# Patient Record
Sex: Female | Born: 1945 | Race: Black or African American | Hispanic: No | Smoking: Never smoker
Health system: Southern US, Community
[De-identification: ages and names within clinical notes are randomized; demographics above are authoritative.]

## PROBLEM LIST (undated history)

## (undated) DIAGNOSIS — L97909 Non-pressure chronic ulcer of unspecified part of unspecified lower leg with unspecified severity: Secondary | ICD-10-CM

## (undated) DIAGNOSIS — I872 Venous insufficiency (chronic) (peripheral): Secondary | ICD-10-CM

## (undated) DIAGNOSIS — G4733 Obstructive sleep apnea (adult) (pediatric): Secondary | ICD-10-CM

## (undated) DIAGNOSIS — K210 Gastro-esophageal reflux disease with esophagitis: Secondary | ICD-10-CM

## (undated) DIAGNOSIS — N183 Chronic kidney disease, stage 3 unspecified: Secondary | ICD-10-CM

## (undated) DIAGNOSIS — E1159 Type 2 diabetes mellitus with other circulatory complications: Secondary | ICD-10-CM

## (undated) DIAGNOSIS — I83009 Varicose veins of unspecified lower extremity with ulcer of unspecified site: Secondary | ICD-10-CM

## (undated) DIAGNOSIS — E559 Vitamin D deficiency, unspecified: Secondary | ICD-10-CM

## (undated) DIAGNOSIS — G8929 Other chronic pain: Secondary | ICD-10-CM

## (undated) DIAGNOSIS — J9600 Acute respiratory failure, unspecified whether with hypoxia or hypercapnia: Secondary | ICD-10-CM

## (undated) DIAGNOSIS — I5032 Chronic diastolic (congestive) heart failure: Secondary | ICD-10-CM

## (undated) DIAGNOSIS — I10 Essential (primary) hypertension: Secondary | ICD-10-CM

## (undated) DIAGNOSIS — E119 Type 2 diabetes mellitus without complications: Secondary | ICD-10-CM

## (undated) DIAGNOSIS — IMO0001 Reserved for inherently not codable concepts without codable children: Secondary | ICD-10-CM

## (undated) HISTORY — DX: Obstructive sleep apnea (adult) (pediatric): G47.33

## (undated) HISTORY — DX: Chronic diastolic (congestive) heart failure: I50.32

## (undated) HISTORY — DX: Non-pressure chronic ulcer of unspecified part of unspecified lower leg with unspecified severity: L97.909

## (undated) HISTORY — DX: Type 2 diabetes mellitus with other circulatory complications: E11.59

## (undated) HISTORY — DX: Vitamin D deficiency, unspecified: E55.9

## (undated) HISTORY — DX: Venous insufficiency (chronic) (peripheral): I87.2

## (undated) HISTORY — DX: Essential (primary) hypertension: I10

## (undated) HISTORY — DX: Gastro-esophageal reflux disease with esophagitis: K21.0

## (undated) HISTORY — DX: Other chronic pain: G89.29

## (undated) HISTORY — DX: Non-pressure chronic ulcer of unspecified part of unspecified lower leg with unspecified severity: I83.009

---

## 1997-10-14 HISTORY — PX: OTHER SURGICAL HISTORY: SHX169

## 2011-10-16 DIAGNOSIS — I739 Peripheral vascular disease, unspecified: Secondary | ICD-10-CM | POA: Diagnosis not present

## 2011-10-16 DIAGNOSIS — L98499 Non-pressure chronic ulcer of skin of other sites with unspecified severity: Secondary | ICD-10-CM | POA: Diagnosis not present

## 2011-10-17 DIAGNOSIS — R609 Edema, unspecified: Secondary | ICD-10-CM | POA: Diagnosis not present

## 2011-10-17 DIAGNOSIS — D649 Anemia, unspecified: Secondary | ICD-10-CM | POA: Diagnosis not present

## 2011-10-17 DIAGNOSIS — N179 Acute kidney failure, unspecified: Secondary | ICD-10-CM | POA: Diagnosis not present

## 2011-10-17 DIAGNOSIS — E119 Type 2 diabetes mellitus without complications: Secondary | ICD-10-CM | POA: Diagnosis not present

## 2011-10-18 DIAGNOSIS — Z79899 Other long term (current) drug therapy: Secondary | ICD-10-CM | POA: Diagnosis not present

## 2011-10-23 DIAGNOSIS — I739 Peripheral vascular disease, unspecified: Secondary | ICD-10-CM | POA: Diagnosis not present

## 2011-10-23 DIAGNOSIS — L98499 Non-pressure chronic ulcer of skin of other sites with unspecified severity: Secondary | ICD-10-CM | POA: Diagnosis not present

## 2011-10-23 DIAGNOSIS — L738 Other specified follicular disorders: Secondary | ICD-10-CM | POA: Diagnosis not present

## 2011-10-31 DIAGNOSIS — L98499 Non-pressure chronic ulcer of skin of other sites with unspecified severity: Secondary | ICD-10-CM | POA: Diagnosis not present

## 2011-10-31 DIAGNOSIS — I739 Peripheral vascular disease, unspecified: Secondary | ICD-10-CM | POA: Diagnosis not present

## 2011-11-06 DIAGNOSIS — L738 Other specified follicular disorders: Secondary | ICD-10-CM | POA: Diagnosis not present

## 2011-11-06 DIAGNOSIS — I739 Peripheral vascular disease, unspecified: Secondary | ICD-10-CM | POA: Diagnosis not present

## 2011-11-13 DIAGNOSIS — L98499 Non-pressure chronic ulcer of skin of other sites with unspecified severity: Secondary | ICD-10-CM | POA: Diagnosis not present

## 2011-11-13 DIAGNOSIS — I739 Peripheral vascular disease, unspecified: Secondary | ICD-10-CM | POA: Diagnosis not present

## 2011-11-13 DIAGNOSIS — L738 Other specified follicular disorders: Secondary | ICD-10-CM | POA: Diagnosis not present

## 2011-11-15 DIAGNOSIS — L97209 Non-pressure chronic ulcer of unspecified calf with unspecified severity: Secondary | ICD-10-CM | POA: Diagnosis present

## 2011-11-15 DIAGNOSIS — Z880 Allergy status to penicillin: Secondary | ICD-10-CM | POA: Diagnosis not present

## 2011-11-15 DIAGNOSIS — I509 Heart failure, unspecified: Secondary | ICD-10-CM | POA: Diagnosis not present

## 2011-11-15 DIAGNOSIS — Z882 Allergy status to sulfonamides status: Secondary | ICD-10-CM | POA: Diagnosis not present

## 2011-11-15 DIAGNOSIS — N390 Urinary tract infection, site not specified: Secondary | ICD-10-CM | POA: Diagnosis not present

## 2011-11-15 DIAGNOSIS — I872 Venous insufficiency (chronic) (peripheral): Secondary | ICD-10-CM | POA: Diagnosis present

## 2011-11-15 DIAGNOSIS — R6520 Severe sepsis without septic shock: Secondary | ICD-10-CM | POA: Diagnosis not present

## 2011-11-15 DIAGNOSIS — I959 Hypotension, unspecified: Secondary | ICD-10-CM | POA: Diagnosis not present

## 2011-11-15 DIAGNOSIS — Z87891 Personal history of nicotine dependence: Secondary | ICD-10-CM | POA: Diagnosis not present

## 2011-11-15 DIAGNOSIS — E119 Type 2 diabetes mellitus without complications: Secondary | ICD-10-CM | POA: Diagnosis not present

## 2011-11-15 DIAGNOSIS — Z7982 Long term (current) use of aspirin: Secondary | ICD-10-CM | POA: Diagnosis not present

## 2011-11-15 DIAGNOSIS — IMO0001 Reserved for inherently not codable concepts without codable children: Secondary | ICD-10-CM | POA: Diagnosis present

## 2011-11-15 DIAGNOSIS — L97309 Non-pressure chronic ulcer of unspecified ankle with unspecified severity: Secondary | ICD-10-CM | POA: Diagnosis present

## 2011-11-15 DIAGNOSIS — N189 Chronic kidney disease, unspecified: Secondary | ICD-10-CM | POA: Diagnosis not present

## 2011-11-15 DIAGNOSIS — G4733 Obstructive sleep apnea (adult) (pediatric): Secondary | ICD-10-CM | POA: Diagnosis present

## 2011-11-15 DIAGNOSIS — Z981 Arthrodesis status: Secondary | ICD-10-CM | POA: Diagnosis not present

## 2011-11-15 DIAGNOSIS — A419 Sepsis, unspecified organism: Secondary | ICD-10-CM | POA: Diagnosis not present

## 2011-11-15 DIAGNOSIS — Z888 Allergy status to other drugs, medicaments and biological substances status: Secondary | ICD-10-CM | POA: Diagnosis not present

## 2011-11-15 DIAGNOSIS — Z881 Allergy status to other antibiotic agents status: Secondary | ICD-10-CM | POA: Diagnosis not present

## 2011-11-15 DIAGNOSIS — I10 Essential (primary) hypertension: Secondary | ICD-10-CM | POA: Diagnosis not present

## 2011-11-15 DIAGNOSIS — R509 Fever, unspecified: Secondary | ICD-10-CM | POA: Diagnosis not present

## 2011-11-15 DIAGNOSIS — R300 Dysuria: Secondary | ICD-10-CM | POA: Diagnosis not present

## 2011-11-15 DIAGNOSIS — N179 Acute kidney failure, unspecified: Secondary | ICD-10-CM | POA: Diagnosis not present

## 2011-11-15 DIAGNOSIS — B964 Proteus (mirabilis) (morganii) as the cause of diseases classified elsewhere: Secondary | ICD-10-CM | POA: Diagnosis not present

## 2011-11-15 DIAGNOSIS — Z79899 Other long term (current) drug therapy: Secondary | ICD-10-CM | POA: Diagnosis not present

## 2011-11-16 DIAGNOSIS — R509 Fever, unspecified: Secondary | ICD-10-CM | POA: Diagnosis not present

## 2011-11-22 DIAGNOSIS — L97909 Non-pressure chronic ulcer of unspecified part of unspecified lower leg with unspecified severity: Secondary | ICD-10-CM | POA: Diagnosis not present

## 2011-11-22 DIAGNOSIS — E669 Obesity, unspecified: Secondary | ICD-10-CM | POA: Diagnosis not present

## 2011-11-22 DIAGNOSIS — R5381 Other malaise: Secondary | ICD-10-CM | POA: Diagnosis not present

## 2011-11-22 DIAGNOSIS — I5033 Acute on chronic diastolic (congestive) heart failure: Secondary | ICD-10-CM | POA: Diagnosis not present

## 2011-11-22 DIAGNOSIS — G4733 Obstructive sleep apnea (adult) (pediatric): Secondary | ICD-10-CM | POA: Diagnosis not present

## 2011-11-22 DIAGNOSIS — Z6841 Body Mass Index (BMI) 40.0 and over, adult: Secondary | ICD-10-CM | POA: Diagnosis not present

## 2011-11-22 DIAGNOSIS — E119 Type 2 diabetes mellitus without complications: Secondary | ICD-10-CM | POA: Diagnosis not present

## 2011-11-22 DIAGNOSIS — F411 Generalized anxiety disorder: Secondary | ICD-10-CM | POA: Diagnosis not present

## 2011-11-22 DIAGNOSIS — I890 Lymphedema, not elsewhere classified: Secondary | ICD-10-CM | POA: Diagnosis not present

## 2011-11-22 DIAGNOSIS — E440 Moderate protein-calorie malnutrition: Secondary | ICD-10-CM | POA: Diagnosis not present

## 2011-11-22 DIAGNOSIS — I872 Venous insufficiency (chronic) (peripheral): Secondary | ICD-10-CM | POA: Diagnosis not present

## 2011-11-22 DIAGNOSIS — I83009 Varicose veins of unspecified lower extremity with ulcer of unspecified site: Secondary | ICD-10-CM | POA: Diagnosis not present

## 2011-11-22 DIAGNOSIS — A419 Sepsis, unspecified organism: Secondary | ICD-10-CM | POA: Diagnosis not present

## 2011-11-22 DIAGNOSIS — R509 Fever, unspecified: Secondary | ICD-10-CM | POA: Diagnosis not present

## 2011-11-22 DIAGNOSIS — Z87891 Personal history of nicotine dependence: Secondary | ICD-10-CM | POA: Diagnosis not present

## 2011-11-22 DIAGNOSIS — I5030 Unspecified diastolic (congestive) heart failure: Secondary | ICD-10-CM | POA: Diagnosis not present

## 2011-11-22 DIAGNOSIS — I10 Essential (primary) hypertension: Secondary | ICD-10-CM | POA: Diagnosis not present

## 2011-11-22 DIAGNOSIS — IMO0001 Reserved for inherently not codable concepts without codable children: Secondary | ICD-10-CM | POA: Diagnosis not present

## 2011-11-22 DIAGNOSIS — N390 Urinary tract infection, site not specified: Secondary | ICD-10-CM | POA: Diagnosis not present

## 2011-11-22 DIAGNOSIS — L02419 Cutaneous abscess of limb, unspecified: Secondary | ICD-10-CM | POA: Diagnosis not present

## 2011-12-17 DIAGNOSIS — G4733 Obstructive sleep apnea (adult) (pediatric): Secondary | ICD-10-CM | POA: Diagnosis not present

## 2011-12-17 DIAGNOSIS — E119 Type 2 diabetes mellitus without complications: Secondary | ICD-10-CM | POA: Diagnosis not present

## 2011-12-17 DIAGNOSIS — L97209 Non-pressure chronic ulcer of unspecified calf with unspecified severity: Secondary | ICD-10-CM | POA: Diagnosis not present

## 2011-12-17 DIAGNOSIS — IMO0001 Reserved for inherently not codable concepts without codable children: Secondary | ICD-10-CM | POA: Diagnosis not present

## 2011-12-17 DIAGNOSIS — F411 Generalized anxiety disorder: Secondary | ICD-10-CM | POA: Diagnosis not present

## 2011-12-17 DIAGNOSIS — I872 Venous insufficiency (chronic) (peripheral): Secondary | ICD-10-CM | POA: Diagnosis not present

## 2011-12-18 DIAGNOSIS — E119 Type 2 diabetes mellitus without complications: Secondary | ICD-10-CM | POA: Diagnosis not present

## 2011-12-18 DIAGNOSIS — IMO0001 Reserved for inherently not codable concepts without codable children: Secondary | ICD-10-CM | POA: Diagnosis not present

## 2011-12-18 DIAGNOSIS — G4733 Obstructive sleep apnea (adult) (pediatric): Secondary | ICD-10-CM | POA: Diagnosis not present

## 2011-12-18 DIAGNOSIS — I872 Venous insufficiency (chronic) (peripheral): Secondary | ICD-10-CM | POA: Diagnosis not present

## 2011-12-18 DIAGNOSIS — L97209 Non-pressure chronic ulcer of unspecified calf with unspecified severity: Secondary | ICD-10-CM | POA: Diagnosis not present

## 2011-12-18 DIAGNOSIS — F411 Generalized anxiety disorder: Secondary | ICD-10-CM | POA: Diagnosis not present

## 2011-12-19 DIAGNOSIS — E119 Type 2 diabetes mellitus without complications: Secondary | ICD-10-CM | POA: Diagnosis not present

## 2011-12-19 DIAGNOSIS — G4733 Obstructive sleep apnea (adult) (pediatric): Secondary | ICD-10-CM | POA: Diagnosis not present

## 2011-12-19 DIAGNOSIS — I872 Venous insufficiency (chronic) (peripheral): Secondary | ICD-10-CM | POA: Diagnosis not present

## 2011-12-19 DIAGNOSIS — IMO0001 Reserved for inherently not codable concepts without codable children: Secondary | ICD-10-CM | POA: Diagnosis not present

## 2011-12-19 DIAGNOSIS — F411 Generalized anxiety disorder: Secondary | ICD-10-CM | POA: Diagnosis not present

## 2011-12-19 DIAGNOSIS — L97209 Non-pressure chronic ulcer of unspecified calf with unspecified severity: Secondary | ICD-10-CM | POA: Diagnosis not present

## 2011-12-20 DIAGNOSIS — E119 Type 2 diabetes mellitus without complications: Secondary | ICD-10-CM | POA: Diagnosis not present

## 2011-12-20 DIAGNOSIS — L97209 Non-pressure chronic ulcer of unspecified calf with unspecified severity: Secondary | ICD-10-CM | POA: Diagnosis not present

## 2011-12-20 DIAGNOSIS — F411 Generalized anxiety disorder: Secondary | ICD-10-CM | POA: Diagnosis not present

## 2011-12-20 DIAGNOSIS — IMO0001 Reserved for inherently not codable concepts without codable children: Secondary | ICD-10-CM | POA: Diagnosis not present

## 2011-12-20 DIAGNOSIS — I872 Venous insufficiency (chronic) (peripheral): Secondary | ICD-10-CM | POA: Diagnosis not present

## 2011-12-20 DIAGNOSIS — G4733 Obstructive sleep apnea (adult) (pediatric): Secondary | ICD-10-CM | POA: Diagnosis not present

## 2011-12-21 DIAGNOSIS — F411 Generalized anxiety disorder: Secondary | ICD-10-CM | POA: Diagnosis not present

## 2011-12-21 DIAGNOSIS — I872 Venous insufficiency (chronic) (peripheral): Secondary | ICD-10-CM | POA: Diagnosis not present

## 2011-12-21 DIAGNOSIS — L97209 Non-pressure chronic ulcer of unspecified calf with unspecified severity: Secondary | ICD-10-CM | POA: Diagnosis not present

## 2011-12-21 DIAGNOSIS — E119 Type 2 diabetes mellitus without complications: Secondary | ICD-10-CM | POA: Diagnosis not present

## 2011-12-21 DIAGNOSIS — G4733 Obstructive sleep apnea (adult) (pediatric): Secondary | ICD-10-CM | POA: Diagnosis not present

## 2011-12-21 DIAGNOSIS — IMO0001 Reserved for inherently not codable concepts without codable children: Secondary | ICD-10-CM | POA: Diagnosis not present

## 2011-12-23 DIAGNOSIS — E874 Mixed disorder of acid-base balance: Secondary | ICD-10-CM | POA: Diagnosis not present

## 2011-12-23 DIAGNOSIS — S88119A Complete traumatic amputation at level between knee and ankle, unspecified lower leg, initial encounter: Secondary | ICD-10-CM | POA: Diagnosis not present

## 2011-12-23 DIAGNOSIS — M25579 Pain in unspecified ankle and joints of unspecified foot: Secondary | ICD-10-CM | POA: Diagnosis present

## 2011-12-23 DIAGNOSIS — L0291 Cutaneous abscess, unspecified: Secondary | ICD-10-CM | POA: Diagnosis not present

## 2011-12-23 DIAGNOSIS — M7989 Other specified soft tissue disorders: Secondary | ICD-10-CM | POA: Diagnosis not present

## 2011-12-23 DIAGNOSIS — E662 Morbid (severe) obesity with alveolar hypoventilation: Secondary | ICD-10-CM | POA: Diagnosis present

## 2011-12-23 DIAGNOSIS — Z9981 Dependence on supplemental oxygen: Secondary | ICD-10-CM | POA: Diagnosis not present

## 2011-12-23 DIAGNOSIS — F411 Generalized anxiety disorder: Secondary | ICD-10-CM | POA: Diagnosis not present

## 2011-12-23 DIAGNOSIS — N390 Urinary tract infection, site not specified: Secondary | ICD-10-CM | POA: Diagnosis present

## 2011-12-23 DIAGNOSIS — I517 Cardiomegaly: Secondary | ICD-10-CM | POA: Diagnosis not present

## 2011-12-23 DIAGNOSIS — Z79899 Other long term (current) drug therapy: Secondary | ICD-10-CM | POA: Diagnosis not present

## 2011-12-23 DIAGNOSIS — Z452 Encounter for adjustment and management of vascular access device: Secondary | ICD-10-CM | POA: Diagnosis not present

## 2011-12-23 DIAGNOSIS — L97209 Non-pressure chronic ulcer of unspecified calf with unspecified severity: Secondary | ICD-10-CM | POA: Diagnosis not present

## 2011-12-23 DIAGNOSIS — R6520 Severe sepsis without septic shock: Secondary | ICD-10-CM | POA: Diagnosis not present

## 2011-12-23 DIAGNOSIS — J811 Chronic pulmonary edema: Secondary | ICD-10-CM | POA: Diagnosis not present

## 2011-12-23 DIAGNOSIS — R918 Other nonspecific abnormal finding of lung field: Secondary | ICD-10-CM | POA: Diagnosis not present

## 2011-12-23 DIAGNOSIS — L02419 Cutaneous abscess of limb, unspecified: Secondary | ICD-10-CM | POA: Diagnosis not present

## 2011-12-23 DIAGNOSIS — A419 Sepsis, unspecified organism: Secondary | ICD-10-CM | POA: Diagnosis not present

## 2011-12-23 DIAGNOSIS — Z4682 Encounter for fitting and adjustment of non-vascular catheter: Secondary | ICD-10-CM | POA: Diagnosis not present

## 2011-12-23 DIAGNOSIS — I2789 Other specified pulmonary heart diseases: Secondary | ICD-10-CM | POA: Diagnosis not present

## 2011-12-23 DIAGNOSIS — L89109 Pressure ulcer of unspecified part of back, unspecified stage: Secondary | ICD-10-CM | POA: Diagnosis present

## 2011-12-23 DIAGNOSIS — L97909 Non-pressure chronic ulcer of unspecified part of unspecified lower leg with unspecified severity: Secondary | ICD-10-CM | POA: Diagnosis present

## 2011-12-23 DIAGNOSIS — I509 Heart failure, unspecified: Secondary | ICD-10-CM | POA: Diagnosis present

## 2011-12-23 DIAGNOSIS — L0390 Cellulitis, unspecified: Secondary | ICD-10-CM | POA: Diagnosis not present

## 2011-12-23 DIAGNOSIS — K219 Gastro-esophageal reflux disease without esophagitis: Secondary | ICD-10-CM | POA: Diagnosis present

## 2011-12-23 DIAGNOSIS — S98919A Complete traumatic amputation of unspecified foot, level unspecified, initial encounter: Secondary | ICD-10-CM | POA: Diagnosis not present

## 2011-12-23 DIAGNOSIS — R609 Edema, unspecified: Secondary | ICD-10-CM | POA: Diagnosis not present

## 2011-12-23 DIAGNOSIS — N179 Acute kidney failure, unspecified: Secondary | ICD-10-CM | POA: Diagnosis present

## 2011-12-23 DIAGNOSIS — T8789 Other complications of amputation stump: Secondary | ICD-10-CM | POA: Diagnosis not present

## 2011-12-23 DIAGNOSIS — L03119 Cellulitis of unspecified part of limb: Secondary | ICD-10-CM | POA: Diagnosis present

## 2011-12-23 DIAGNOSIS — E870 Hyperosmolality and hypernatremia: Secondary | ICD-10-CM | POA: Diagnosis not present

## 2011-12-23 DIAGNOSIS — M129 Arthropathy, unspecified: Secondary | ICD-10-CM | POA: Diagnosis present

## 2011-12-23 DIAGNOSIS — L97509 Non-pressure chronic ulcer of other part of unspecified foot with unspecified severity: Secondary | ICD-10-CM | POA: Diagnosis not present

## 2011-12-23 DIAGNOSIS — R6889 Other general symptoms and signs: Secondary | ICD-10-CM | POA: Diagnosis not present

## 2011-12-23 DIAGNOSIS — E119 Type 2 diabetes mellitus without complications: Secondary | ICD-10-CM | POA: Diagnosis not present

## 2011-12-23 DIAGNOSIS — S81809A Unspecified open wound, unspecified lower leg, initial encounter: Secondary | ICD-10-CM | POA: Diagnosis not present

## 2011-12-23 DIAGNOSIS — M869 Osteomyelitis, unspecified: Secondary | ICD-10-CM | POA: Diagnosis present

## 2011-12-23 DIAGNOSIS — I5030 Unspecified diastolic (congestive) heart failure: Secondary | ICD-10-CM | POA: Diagnosis present

## 2011-12-23 DIAGNOSIS — Z6841 Body Mass Index (BMI) 40.0 and over, adult: Secondary | ICD-10-CM | POA: Diagnosis not present

## 2011-12-23 DIAGNOSIS — E872 Acidosis: Secondary | ICD-10-CM | POA: Diagnosis not present

## 2011-12-23 DIAGNOSIS — E8779 Other fluid overload: Secondary | ICD-10-CM | POA: Diagnosis not present

## 2011-12-23 DIAGNOSIS — D500 Iron deficiency anemia secondary to blood loss (chronic): Secondary | ICD-10-CM | POA: Diagnosis not present

## 2011-12-23 DIAGNOSIS — G4733 Obstructive sleep apnea (adult) (pediatric): Secondary | ICD-10-CM | POA: Diagnosis not present

## 2011-12-23 DIAGNOSIS — J9600 Acute respiratory failure, unspecified whether with hypoxia or hypercapnia: Secondary | ICD-10-CM | POA: Diagnosis not present

## 2011-12-23 DIAGNOSIS — I959 Hypotension, unspecified: Secondary | ICD-10-CM | POA: Diagnosis not present

## 2011-12-23 DIAGNOSIS — J449 Chronic obstructive pulmonary disease, unspecified: Secondary | ICD-10-CM | POA: Diagnosis not present

## 2011-12-23 DIAGNOSIS — IMO0001 Reserved for inherently not codable concepts without codable children: Secondary | ICD-10-CM | POA: Diagnosis not present

## 2011-12-23 DIAGNOSIS — I872 Venous insufficiency (chronic) (peripheral): Secondary | ICD-10-CM | POA: Diagnosis not present

## 2011-12-23 DIAGNOSIS — E875 Hyperkalemia: Secondary | ICD-10-CM | POA: Diagnosis not present

## 2011-12-23 DIAGNOSIS — J90 Pleural effusion, not elsewhere classified: Secondary | ICD-10-CM | POA: Diagnosis not present

## 2011-12-23 DIAGNOSIS — R000 Tachycardia, unspecified: Secondary | ICD-10-CM | POA: Diagnosis present

## 2011-12-23 DIAGNOSIS — J9610 Chronic respiratory failure, unspecified whether with hypoxia or hypercapnia: Secondary | ICD-10-CM | POA: Diagnosis not present

## 2011-12-23 DIAGNOSIS — R079 Chest pain, unspecified: Secondary | ICD-10-CM | POA: Diagnosis not present

## 2011-12-23 DIAGNOSIS — J9819 Other pulmonary collapse: Secondary | ICD-10-CM | POA: Diagnosis not present

## 2011-12-23 DIAGNOSIS — M25569 Pain in unspecified knee: Secondary | ICD-10-CM | POA: Diagnosis not present

## 2011-12-23 DIAGNOSIS — Z792 Long term (current) use of antibiotics: Secondary | ICD-10-CM | POA: Diagnosis not present

## 2011-12-23 DIAGNOSIS — T879 Unspecified complications of amputation stump: Secondary | ICD-10-CM | POA: Diagnosis not present

## 2011-12-23 DIAGNOSIS — J9620 Acute and chronic respiratory failure, unspecified whether with hypoxia or hypercapnia: Secondary | ICD-10-CM | POA: Diagnosis present

## 2011-12-23 DIAGNOSIS — R6521 Severe sepsis with septic shock: Secondary | ICD-10-CM | POA: Diagnosis not present

## 2011-12-23 DIAGNOSIS — R509 Fever, unspecified: Secondary | ICD-10-CM | POA: Diagnosis present

## 2012-01-03 HISTORY — PX: LEG AMPUTATION BELOW KNEE: SHX694

## 2012-01-07 DIAGNOSIS — F411 Generalized anxiety disorder: Secondary | ICD-10-CM | POA: Diagnosis not present

## 2012-01-07 DIAGNOSIS — IMO0001 Reserved for inherently not codable concepts without codable children: Secondary | ICD-10-CM | POA: Diagnosis not present

## 2012-01-07 DIAGNOSIS — Z9981 Dependence on supplemental oxygen: Secondary | ICD-10-CM | POA: Diagnosis not present

## 2012-01-07 DIAGNOSIS — L97209 Non-pressure chronic ulcer of unspecified calf with unspecified severity: Secondary | ICD-10-CM | POA: Diagnosis not present

## 2012-01-16 DIAGNOSIS — I739 Peripheral vascular disease, unspecified: Secondary | ICD-10-CM | POA: Diagnosis not present

## 2012-01-16 DIAGNOSIS — I7389 Other specified peripheral vascular diseases: Secondary | ICD-10-CM | POA: Diagnosis not present

## 2012-01-16 DIAGNOSIS — A419 Sepsis, unspecified organism: Secondary | ICD-10-CM | POA: Diagnosis not present

## 2012-01-16 DIAGNOSIS — J9600 Acute respiratory failure, unspecified whether with hypoxia or hypercapnia: Secondary | ICD-10-CM | POA: Diagnosis not present

## 2012-01-16 DIAGNOSIS — R5381 Other malaise: Secondary | ICD-10-CM | POA: Diagnosis not present

## 2012-01-16 DIAGNOSIS — N390 Urinary tract infection, site not specified: Secondary | ICD-10-CM | POA: Diagnosis not present

## 2012-01-16 DIAGNOSIS — F321 Major depressive disorder, single episode, moderate: Secondary | ICD-10-CM | POA: Diagnosis not present

## 2012-01-16 DIAGNOSIS — R6889 Other general symptoms and signs: Secondary | ICD-10-CM | POA: Diagnosis not present

## 2012-01-16 DIAGNOSIS — S88119A Complete traumatic amputation at level between knee and ankle, unspecified lower leg, initial encounter: Secondary | ICD-10-CM | POA: Diagnosis not present

## 2012-01-16 DIAGNOSIS — J9610 Chronic respiratory failure, unspecified whether with hypoxia or hypercapnia: Secondary | ICD-10-CM | POA: Diagnosis not present

## 2012-01-16 DIAGNOSIS — N179 Acute kidney failure, unspecified: Secondary | ICD-10-CM | POA: Diagnosis not present

## 2012-01-16 DIAGNOSIS — R6521 Severe sepsis with septic shock: Secondary | ICD-10-CM | POA: Diagnosis not present

## 2012-01-16 DIAGNOSIS — G4733 Obstructive sleep apnea (adult) (pediatric): Secondary | ICD-10-CM | POA: Diagnosis not present

## 2012-01-16 DIAGNOSIS — I10 Essential (primary) hypertension: Secondary | ICD-10-CM | POA: Diagnosis not present

## 2012-01-16 DIAGNOSIS — I5030 Unspecified diastolic (congestive) heart failure: Secondary | ICD-10-CM | POA: Diagnosis not present

## 2012-01-16 DIAGNOSIS — Z9981 Dependence on supplemental oxygen: Secondary | ICD-10-CM | POA: Diagnosis not present

## 2012-01-16 DIAGNOSIS — N189 Chronic kidney disease, unspecified: Secondary | ICD-10-CM | POA: Diagnosis not present

## 2012-01-16 DIAGNOSIS — F411 Generalized anxiety disorder: Secondary | ICD-10-CM | POA: Diagnosis not present

## 2012-01-16 DIAGNOSIS — L02619 Cutaneous abscess of unspecified foot: Secondary | ICD-10-CM | POA: Diagnosis not present

## 2012-01-16 DIAGNOSIS — N3289 Other specified disorders of bladder: Secondary | ICD-10-CM | POA: Diagnosis not present

## 2012-01-16 DIAGNOSIS — L03119 Cellulitis of unspecified part of limb: Secondary | ICD-10-CM | POA: Diagnosis not present

## 2012-01-16 DIAGNOSIS — I872 Venous insufficiency (chronic) (peripheral): Secondary | ICD-10-CM | POA: Diagnosis not present

## 2012-01-16 DIAGNOSIS — I890 Lymphedema, not elsewhere classified: Secondary | ICD-10-CM | POA: Diagnosis not present

## 2012-01-16 DIAGNOSIS — E872 Acidosis: Secondary | ICD-10-CM | POA: Diagnosis not present

## 2012-01-16 DIAGNOSIS — J9620 Acute and chronic respiratory failure, unspecified whether with hypoxia or hypercapnia: Secondary | ICD-10-CM | POA: Diagnosis not present

## 2012-01-16 DIAGNOSIS — E119 Type 2 diabetes mellitus without complications: Secondary | ICD-10-CM | POA: Diagnosis not present

## 2012-01-16 DIAGNOSIS — E662 Morbid (severe) obesity with alveolar hypoventilation: Secondary | ICD-10-CM | POA: Diagnosis not present

## 2012-01-16 DIAGNOSIS — F329 Major depressive disorder, single episode, unspecified: Secondary | ICD-10-CM | POA: Diagnosis not present

## 2012-01-16 DIAGNOSIS — I129 Hypertensive chronic kidney disease with stage 1 through stage 4 chronic kidney disease, or unspecified chronic kidney disease: Secondary | ICD-10-CM | POA: Diagnosis not present

## 2012-01-16 DIAGNOSIS — Z4789 Encounter for other orthopedic aftercare: Secondary | ICD-10-CM | POA: Diagnosis not present

## 2012-01-16 DIAGNOSIS — L0291 Cutaneous abscess, unspecified: Secondary | ICD-10-CM | POA: Diagnosis not present

## 2012-01-16 DIAGNOSIS — G547 Phantom limb syndrome without pain: Secondary | ICD-10-CM | POA: Diagnosis not present

## 2012-01-16 DIAGNOSIS — M1990 Unspecified osteoarthritis, unspecified site: Secondary | ICD-10-CM | POA: Diagnosis not present

## 2012-01-16 DIAGNOSIS — Z6841 Body Mass Index (BMI) 40.0 and over, adult: Secondary | ICD-10-CM | POA: Diagnosis not present

## 2012-02-11 DIAGNOSIS — H2510 Age-related nuclear cataract, unspecified eye: Secondary | ICD-10-CM | POA: Diagnosis not present

## 2012-02-11 DIAGNOSIS — I872 Venous insufficiency (chronic) (peripheral): Secondary | ICD-10-CM | POA: Diagnosis not present

## 2012-02-11 DIAGNOSIS — I5030 Unspecified diastolic (congestive) heart failure: Secondary | ICD-10-CM | POA: Diagnosis not present

## 2012-02-11 DIAGNOSIS — I10 Essential (primary) hypertension: Secondary | ICD-10-CM | POA: Diagnosis not present

## 2012-02-11 DIAGNOSIS — I739 Peripheral vascular disease, unspecified: Secondary | ICD-10-CM | POA: Diagnosis not present

## 2012-02-11 DIAGNOSIS — E119 Type 2 diabetes mellitus without complications: Secondary | ICD-10-CM | POA: Diagnosis not present

## 2012-02-11 DIAGNOSIS — R5381 Other malaise: Secondary | ICD-10-CM | POA: Diagnosis not present

## 2012-02-11 DIAGNOSIS — S88119A Complete traumatic amputation at level between knee and ankle, unspecified lower leg, initial encounter: Secondary | ICD-10-CM | POA: Diagnosis not present

## 2012-02-11 DIAGNOSIS — I8310 Varicose veins of unspecified lower extremity with inflammation: Secondary | ICD-10-CM | POA: Diagnosis not present

## 2012-02-11 DIAGNOSIS — E559 Vitamin D deficiency, unspecified: Secondary | ICD-10-CM | POA: Diagnosis not present

## 2012-02-11 DIAGNOSIS — G4700 Insomnia, unspecified: Secondary | ICD-10-CM | POA: Diagnosis not present

## 2012-02-11 DIAGNOSIS — L97809 Non-pressure chronic ulcer of other part of unspecified lower leg with unspecified severity: Secondary | ICD-10-CM | POA: Diagnosis not present

## 2012-02-11 DIAGNOSIS — I5032 Chronic diastolic (congestive) heart failure: Secondary | ICD-10-CM | POA: Diagnosis not present

## 2012-02-11 DIAGNOSIS — H40019 Open angle with borderline findings, low risk, unspecified eye: Secondary | ICD-10-CM | POA: Diagnosis not present

## 2012-02-11 DIAGNOSIS — K2100 Gastro-esophageal reflux disease with esophagitis, without bleeding: Secondary | ICD-10-CM | POA: Diagnosis not present

## 2012-02-11 DIAGNOSIS — E662 Morbid (severe) obesity with alveolar hypoventilation: Secondary | ICD-10-CM | POA: Diagnosis not present

## 2012-02-11 DIAGNOSIS — E669 Obesity, unspecified: Secondary | ICD-10-CM | POA: Diagnosis not present

## 2012-02-11 DIAGNOSIS — G4710 Hypersomnia, unspecified: Secondary | ICD-10-CM | POA: Diagnosis not present

## 2012-02-11 DIAGNOSIS — E1159 Type 2 diabetes mellitus with other circulatory complications: Secondary | ICD-10-CM | POA: Diagnosis not present

## 2012-02-11 DIAGNOSIS — B351 Tinea unguium: Secondary | ICD-10-CM | POA: Diagnosis not present

## 2012-02-11 DIAGNOSIS — T8189XA Other complications of procedures, not elsewhere classified, initial encounter: Secondary | ICD-10-CM | POA: Diagnosis not present

## 2012-02-11 DIAGNOSIS — H00019 Hordeolum externum unspecified eye, unspecified eyelid: Secondary | ICD-10-CM | POA: Diagnosis not present

## 2012-02-11 DIAGNOSIS — F3289 Other specified depressive episodes: Secondary | ICD-10-CM | POA: Diagnosis not present

## 2012-02-11 DIAGNOSIS — S78119A Complete traumatic amputation at level between unspecified hip and knee, initial encounter: Secondary | ICD-10-CM | POA: Diagnosis not present

## 2012-02-11 DIAGNOSIS — F411 Generalized anxiety disorder: Secondary | ICD-10-CM | POA: Diagnosis not present

## 2012-02-11 DIAGNOSIS — A419 Sepsis, unspecified organism: Secondary | ICD-10-CM | POA: Diagnosis not present

## 2012-02-11 DIAGNOSIS — L03119 Cellulitis of unspecified part of limb: Secondary | ICD-10-CM | POA: Diagnosis not present

## 2012-02-11 DIAGNOSIS — D638 Anemia in other chronic diseases classified elsewhere: Secondary | ICD-10-CM | POA: Diagnosis not present

## 2012-02-11 DIAGNOSIS — E1149 Type 2 diabetes mellitus with other diabetic neurological complication: Secondary | ICD-10-CM | POA: Diagnosis not present

## 2012-02-11 DIAGNOSIS — L0291 Cutaneous abscess, unspecified: Secondary | ICD-10-CM | POA: Diagnosis not present

## 2012-02-11 DIAGNOSIS — Z79899 Other long term (current) drug therapy: Secondary | ICD-10-CM | POA: Diagnosis not present

## 2012-02-11 DIAGNOSIS — Z5189 Encounter for other specified aftercare: Secondary | ICD-10-CM | POA: Diagnosis not present

## 2012-02-11 DIAGNOSIS — Z23 Encounter for immunization: Secondary | ICD-10-CM | POA: Diagnosis not present

## 2012-02-11 DIAGNOSIS — M79609 Pain in unspecified limb: Secondary | ICD-10-CM | POA: Diagnosis not present

## 2012-02-11 DIAGNOSIS — I83009 Varicose veins of unspecified lower extremity with ulcer of unspecified site: Secondary | ICD-10-CM | POA: Diagnosis not present

## 2012-02-11 DIAGNOSIS — L679 Hair color and hair shaft abnormality, unspecified: Secondary | ICD-10-CM | POA: Diagnosis not present

## 2012-02-11 DIAGNOSIS — IMO0001 Reserved for inherently not codable concepts without codable children: Secondary | ICD-10-CM | POA: Diagnosis not present

## 2012-02-11 DIAGNOSIS — G4733 Obstructive sleep apnea (adult) (pediatric): Secondary | ICD-10-CM | POA: Diagnosis not present

## 2012-02-11 DIAGNOSIS — G8929 Other chronic pain: Secondary | ICD-10-CM | POA: Diagnosis not present

## 2012-02-11 DIAGNOSIS — I771 Stricture of artery: Secondary | ICD-10-CM | POA: Diagnosis not present

## 2012-02-11 DIAGNOSIS — N390 Urinary tract infection, site not specified: Secondary | ICD-10-CM | POA: Diagnosis not present

## 2012-02-11 DIAGNOSIS — J9620 Acute and chronic respiratory failure, unspecified whether with hypoxia or hypercapnia: Secondary | ICD-10-CM | POA: Diagnosis not present

## 2012-02-11 DIAGNOSIS — I7389 Other specified peripheral vascular diseases: Secondary | ICD-10-CM | POA: Diagnosis not present

## 2012-02-11 DIAGNOSIS — L97909 Non-pressure chronic ulcer of unspecified part of unspecified lower leg with unspecified severity: Secondary | ICD-10-CM | POA: Diagnosis not present

## 2012-02-11 DIAGNOSIS — G890 Central pain syndrome: Secondary | ICD-10-CM | POA: Diagnosis not present

## 2012-02-11 DIAGNOSIS — R82998 Other abnormal findings in urine: Secondary | ICD-10-CM | POA: Diagnosis not present

## 2012-02-11 DIAGNOSIS — R262 Difficulty in walking, not elsewhere classified: Secondary | ICD-10-CM | POA: Diagnosis not present

## 2012-02-11 DIAGNOSIS — J069 Acute upper respiratory infection, unspecified: Secondary | ICD-10-CM | POA: Diagnosis not present

## 2012-02-11 DIAGNOSIS — S31109A Unspecified open wound of abdominal wall, unspecified quadrant without penetration into peritoneal cavity, initial encounter: Secondary | ICD-10-CM | POA: Diagnosis not present

## 2012-02-11 DIAGNOSIS — J301 Allergic rhinitis due to pollen: Secondary | ICD-10-CM | POA: Diagnosis not present

## 2012-02-11 DIAGNOSIS — R339 Retention of urine, unspecified: Secondary | ICD-10-CM | POA: Diagnosis not present

## 2012-02-12 DIAGNOSIS — I5032 Chronic diastolic (congestive) heart failure: Secondary | ICD-10-CM | POA: Diagnosis not present

## 2012-02-12 DIAGNOSIS — I10 Essential (primary) hypertension: Secondary | ICD-10-CM | POA: Diagnosis not present

## 2012-02-12 DIAGNOSIS — E1159 Type 2 diabetes mellitus with other circulatory complications: Secondary | ICD-10-CM | POA: Diagnosis not present

## 2012-02-12 DIAGNOSIS — L97809 Non-pressure chronic ulcer of other part of unspecified lower leg with unspecified severity: Secondary | ICD-10-CM | POA: Diagnosis not present

## 2012-02-12 DIAGNOSIS — G890 Central pain syndrome: Secondary | ICD-10-CM | POA: Diagnosis not present

## 2012-03-16 DIAGNOSIS — E119 Type 2 diabetes mellitus without complications: Secondary | ICD-10-CM | POA: Diagnosis not present

## 2012-03-16 DIAGNOSIS — S78119A Complete traumatic amputation at level between unspecified hip and knee, initial encounter: Secondary | ICD-10-CM | POA: Diagnosis not present

## 2012-03-16 DIAGNOSIS — T8189XA Other complications of procedures, not elsewhere classified, initial encounter: Secondary | ICD-10-CM | POA: Diagnosis not present

## 2012-03-25 DIAGNOSIS — G890 Central pain syndrome: Secondary | ICD-10-CM | POA: Diagnosis not present

## 2012-03-25 DIAGNOSIS — L97809 Non-pressure chronic ulcer of other part of unspecified lower leg with unspecified severity: Secondary | ICD-10-CM | POA: Diagnosis not present

## 2012-03-25 DIAGNOSIS — I10 Essential (primary) hypertension: Secondary | ICD-10-CM | POA: Diagnosis not present

## 2012-03-25 DIAGNOSIS — E1159 Type 2 diabetes mellitus with other circulatory complications: Secondary | ICD-10-CM | POA: Diagnosis not present

## 2012-03-25 DIAGNOSIS — I5032 Chronic diastolic (congestive) heart failure: Secondary | ICD-10-CM | POA: Diagnosis not present

## 2012-03-27 DIAGNOSIS — M79609 Pain in unspecified limb: Secondary | ICD-10-CM | POA: Diagnosis not present

## 2012-03-27 DIAGNOSIS — R339 Retention of urine, unspecified: Secondary | ICD-10-CM | POA: Diagnosis not present

## 2012-03-27 DIAGNOSIS — B351 Tinea unguium: Secondary | ICD-10-CM | POA: Diagnosis not present

## 2012-03-27 DIAGNOSIS — E1149 Type 2 diabetes mellitus with other diabetic neurological complication: Secondary | ICD-10-CM | POA: Diagnosis not present

## 2012-03-27 DIAGNOSIS — R262 Difficulty in walking, not elsewhere classified: Secondary | ICD-10-CM | POA: Diagnosis not present

## 2012-04-29 DIAGNOSIS — E1159 Type 2 diabetes mellitus with other circulatory complications: Secondary | ICD-10-CM | POA: Diagnosis not present

## 2012-04-29 DIAGNOSIS — I10 Essential (primary) hypertension: Secondary | ICD-10-CM | POA: Diagnosis not present

## 2012-04-29 DIAGNOSIS — L97809 Non-pressure chronic ulcer of other part of unspecified lower leg with unspecified severity: Secondary | ICD-10-CM | POA: Diagnosis not present

## 2012-04-29 DIAGNOSIS — I5032 Chronic diastolic (congestive) heart failure: Secondary | ICD-10-CM | POA: Diagnosis not present

## 2012-04-29 DIAGNOSIS — G890 Central pain syndrome: Secondary | ICD-10-CM | POA: Diagnosis not present

## 2012-05-14 DIAGNOSIS — E559 Vitamin D deficiency, unspecified: Secondary | ICD-10-CM | POA: Diagnosis not present

## 2012-05-14 DIAGNOSIS — G4733 Obstructive sleep apnea (adult) (pediatric): Secondary | ICD-10-CM | POA: Diagnosis not present

## 2012-05-14 DIAGNOSIS — N390 Urinary tract infection, site not specified: Secondary | ICD-10-CM | POA: Diagnosis not present

## 2012-05-14 DIAGNOSIS — S31109A Unspecified open wound of abdominal wall, unspecified quadrant without penetration into peritoneal cavity, initial encounter: Secondary | ICD-10-CM | POA: Diagnosis not present

## 2012-05-14 DIAGNOSIS — I872 Venous insufficiency (chronic) (peripheral): Secondary | ICD-10-CM | POA: Diagnosis not present

## 2012-05-14 DIAGNOSIS — I5032 Chronic diastolic (congestive) heart failure: Secondary | ICD-10-CM | POA: Diagnosis not present

## 2012-05-14 DIAGNOSIS — L02419 Cutaneous abscess of limb, unspecified: Secondary | ICD-10-CM | POA: Diagnosis not present

## 2012-05-14 DIAGNOSIS — E662 Morbid (severe) obesity with alveolar hypoventilation: Secondary | ICD-10-CM | POA: Diagnosis not present

## 2012-05-14 DIAGNOSIS — E119 Type 2 diabetes mellitus without complications: Secondary | ICD-10-CM | POA: Diagnosis not present

## 2012-05-14 DIAGNOSIS — F329 Major depressive disorder, single episode, unspecified: Secondary | ICD-10-CM | POA: Diagnosis not present

## 2012-05-14 DIAGNOSIS — L739 Follicular disorder, unspecified: Secondary | ICD-10-CM | POA: Diagnosis not present

## 2012-05-14 DIAGNOSIS — I10 Essential (primary) hypertension: Secondary | ICD-10-CM | POA: Diagnosis not present

## 2012-05-14 DIAGNOSIS — L0291 Cutaneous abscess, unspecified: Secondary | ICD-10-CM | POA: Diagnosis not present

## 2012-05-14 DIAGNOSIS — R82998 Other abnormal findings in urine: Secondary | ICD-10-CM | POA: Diagnosis not present

## 2012-05-14 DIAGNOSIS — F411 Generalized anxiety disorder: Secondary | ICD-10-CM | POA: Diagnosis not present

## 2012-05-14 DIAGNOSIS — I5030 Unspecified diastolic (congestive) heart failure: Secondary | ICD-10-CM | POA: Diagnosis not present

## 2012-05-14 DIAGNOSIS — IMO0001 Reserved for inherently not codable concepts without codable children: Secondary | ICD-10-CM | POA: Diagnosis not present

## 2012-05-14 DIAGNOSIS — G4700 Insomnia, unspecified: Secondary | ICD-10-CM | POA: Diagnosis not present

## 2012-05-14 DIAGNOSIS — E1159 Type 2 diabetes mellitus with other circulatory complications: Secondary | ICD-10-CM | POA: Diagnosis not present

## 2012-05-14 DIAGNOSIS — D638 Anemia in other chronic diseases classified elsewhere: Secondary | ICD-10-CM | POA: Diagnosis not present

## 2012-05-14 DIAGNOSIS — G8929 Other chronic pain: Secondary | ICD-10-CM | POA: Diagnosis not present

## 2012-05-14 DIAGNOSIS — I7389 Other specified peripheral vascular diseases: Secondary | ICD-10-CM | POA: Diagnosis not present

## 2012-05-14 DIAGNOSIS — H2510 Age-related nuclear cataract, unspecified eye: Secondary | ICD-10-CM | POA: Diagnosis not present

## 2012-05-14 DIAGNOSIS — B351 Tinea unguium: Secondary | ICD-10-CM | POA: Diagnosis not present

## 2012-05-14 DIAGNOSIS — G890 Central pain syndrome: Secondary | ICD-10-CM | POA: Diagnosis not present

## 2012-05-14 DIAGNOSIS — J301 Allergic rhinitis due to pollen: Secondary | ICD-10-CM | POA: Diagnosis not present

## 2012-05-14 DIAGNOSIS — Z23 Encounter for immunization: Secondary | ICD-10-CM | POA: Diagnosis not present

## 2012-05-14 DIAGNOSIS — I8310 Varicose veins of unspecified lower extremity with inflammation: Secondary | ICD-10-CM | POA: Diagnosis not present

## 2012-05-14 DIAGNOSIS — Z79899 Other long term (current) drug therapy: Secondary | ICD-10-CM | POA: Diagnosis not present

## 2012-05-14 DIAGNOSIS — G4710 Hypersomnia, unspecified: Secondary | ICD-10-CM | POA: Diagnosis not present

## 2012-05-14 DIAGNOSIS — I771 Stricture of artery: Secondary | ICD-10-CM | POA: Diagnosis not present

## 2012-05-14 DIAGNOSIS — I83009 Varicose veins of unspecified lower extremity with ulcer of unspecified site: Secondary | ICD-10-CM | POA: Diagnosis not present

## 2012-05-14 DIAGNOSIS — H40019 Open angle with borderline findings, low risk, unspecified eye: Secondary | ICD-10-CM | POA: Diagnosis not present

## 2012-05-14 DIAGNOSIS — H00019 Hordeolum externum unspecified eye, unspecified eyelid: Secondary | ICD-10-CM | POA: Diagnosis not present

## 2012-05-14 DIAGNOSIS — S88119A Complete traumatic amputation at level between knee and ankle, unspecified lower leg, initial encounter: Secondary | ICD-10-CM | POA: Diagnosis not present

## 2012-05-14 DIAGNOSIS — L97809 Non-pressure chronic ulcer of other part of unspecified lower leg with unspecified severity: Secondary | ICD-10-CM | POA: Diagnosis not present

## 2012-05-14 DIAGNOSIS — J9620 Acute and chronic respiratory failure, unspecified whether with hypoxia or hypercapnia: Secondary | ICD-10-CM | POA: Diagnosis not present

## 2012-05-14 DIAGNOSIS — M79609 Pain in unspecified limb: Secondary | ICD-10-CM | POA: Diagnosis not present

## 2012-05-14 DIAGNOSIS — I739 Peripheral vascular disease, unspecified: Secondary | ICD-10-CM | POA: Diagnosis not present

## 2012-05-14 DIAGNOSIS — J069 Acute upper respiratory infection, unspecified: Secondary | ICD-10-CM | POA: Diagnosis not present

## 2012-05-14 DIAGNOSIS — E669 Obesity, unspecified: Secondary | ICD-10-CM | POA: Diagnosis not present

## 2012-05-26 DIAGNOSIS — E119 Type 2 diabetes mellitus without complications: Secondary | ICD-10-CM | POA: Diagnosis not present

## 2012-05-26 DIAGNOSIS — H2510 Age-related nuclear cataract, unspecified eye: Secondary | ICD-10-CM | POA: Diagnosis not present

## 2012-05-26 DIAGNOSIS — H40019 Open angle with borderline findings, low risk, unspecified eye: Secondary | ICD-10-CM | POA: Diagnosis not present

## 2012-05-27 DIAGNOSIS — D638 Anemia in other chronic diseases classified elsewhere: Secondary | ICD-10-CM | POA: Diagnosis not present

## 2012-05-27 DIAGNOSIS — L739 Follicular disorder, unspecified: Secondary | ICD-10-CM | POA: Diagnosis not present

## 2012-05-27 DIAGNOSIS — G890 Central pain syndrome: Secondary | ICD-10-CM | POA: Diagnosis not present

## 2012-05-27 DIAGNOSIS — I10 Essential (primary) hypertension: Secondary | ICD-10-CM | POA: Diagnosis not present

## 2012-05-27 DIAGNOSIS — I5032 Chronic diastolic (congestive) heart failure: Secondary | ICD-10-CM | POA: Diagnosis not present

## 2012-06-17 DIAGNOSIS — L97809 Non-pressure chronic ulcer of other part of unspecified lower leg with unspecified severity: Secondary | ICD-10-CM | POA: Diagnosis not present

## 2012-06-17 DIAGNOSIS — J301 Allergic rhinitis due to pollen: Secondary | ICD-10-CM | POA: Diagnosis not present

## 2012-06-22 DIAGNOSIS — I771 Stricture of artery: Secondary | ICD-10-CM | POA: Diagnosis not present

## 2012-06-25 ENCOUNTER — Encounter (HOSPITAL_BASED_OUTPATIENT_CLINIC_OR_DEPARTMENT_OTHER): Payer: Medicare Other | Attending: Internal Medicine

## 2012-06-25 DIAGNOSIS — I739 Peripheral vascular disease, unspecified: Secondary | ICD-10-CM | POA: Insufficient documentation

## 2012-06-25 DIAGNOSIS — I10 Essential (primary) hypertension: Secondary | ICD-10-CM | POA: Insufficient documentation

## 2012-06-25 DIAGNOSIS — E119 Type 2 diabetes mellitus without complications: Secondary | ICD-10-CM | POA: Insufficient documentation

## 2012-06-25 DIAGNOSIS — L97809 Non-pressure chronic ulcer of other part of unspecified lower leg with unspecified severity: Secondary | ICD-10-CM | POA: Insufficient documentation

## 2012-06-25 DIAGNOSIS — E669 Obesity, unspecified: Secondary | ICD-10-CM | POA: Insufficient documentation

## 2012-06-25 DIAGNOSIS — I872 Venous insufficiency (chronic) (peripheral): Secondary | ICD-10-CM | POA: Diagnosis not present

## 2012-06-25 LAB — GLUCOSE, CAPILLARY: Glucose-Capillary: 156 mg/dL — ABNORMAL HIGH (ref 70–99)

## 2012-06-25 NOTE — Progress Notes (Signed)
[facility]  NAME:  Heather Zimmerman, Heather Zimmerman               ACCOUNT NO.:  [id number]  MEDICAL RECORD NO.:  [id number]      DATE OF BIRTH:  Mar 03, 1946  PHYSICIAN:  Maxwell Caul, M.D. VISIT DATE:  06/25/2012                                  OFFICE VISIT   [name] is a lady who came to [place] from [facility].  She comes with a note from the skilled facility suggesting that this was a chronic wound on her left lower leg.  Somebody had ordered Medihoney Aquacel under a trilayer wrap.  They have not been making progress.  She is status post right BKA for a history of peripheral vascular disease, although I have none of this information.  She is a diabetic, however somewhat surprisingly showed an ABI of 0.97 with a biphasic Doppler waveforms at all level.  This did not suggest significant arterial occlusive disease in the left leg.  PAST MEDICAL HISTORY:  Morbid obesity, obstructive sleep apnea, gastroesophageal reflux disease, type 2 diabetes, diastolic heart failure, chronic respiratory failure probably secondary to sleep apnea, essential hypertension, peripheral vascular disease, cellulitis and depression.  MEDICATION LIST:  Reviewed.  She is on amitriptyline 25 daily, Claritin 10 daily, Flonase 1 spray daily, gabapentin 300 two times a day, Lantus insulin 5 units daily, lisinopril 2.5 daily, magnesium oxide 400 three times a day, NovoLog insulin 5 units 3-4 times daily, Protonix 40 daily, and Zoloft 50 daily.  PHYSICAL EXAMINATION:  VITAL SIGNS:  Temperature is 98.3, pulse is 80, respirations 18, blood pressure 103/70.  The area in question is in her left lower leg.  There is significant deep pigmentation here.  I wonder whether she has had previous skin grafts in this area.  She clearly has significant venous stasis.  Her peripheral pulses were actually palpable in the dorsalis pedis.  The wounds are circumferentially lateral extending into the  posterior aspect of the left leg just above the ankle.  None of these actually looked too ominous, in fact there was already some epithelialization present suggesting that we were probably on the right track here.  The area is moist.  The skin looks macerated.  IMPRESSION:  Chronic wound of uncertain duration.  The patient states years.  The area actually looks surprisingly good.  I think we stop the Hydrogel and Medihoney and just use the Aquacel Ag under the Profore wrap.  This would be the 1st step.  If this does not show healing, then we will consider Apligraf in 2 or 3 weeks.  She was sent back to the facility with these instructions.  The dressing we put on I think can be left for a week and we will see her again in 1 week's time.          ______________________________ Maxwell Caul, M.D.     MGR/MEDQ  D:  06/25/2012  T:  06/25/2012  Job:  [id number]

## 2012-07-01 DIAGNOSIS — L97809 Non-pressure chronic ulcer of other part of unspecified lower leg with unspecified severity: Secondary | ICD-10-CM | POA: Diagnosis not present

## 2012-07-01 DIAGNOSIS — I5032 Chronic diastolic (congestive) heart failure: Secondary | ICD-10-CM | POA: Diagnosis not present

## 2012-07-01 DIAGNOSIS — D638 Anemia in other chronic diseases classified elsewhere: Secondary | ICD-10-CM | POA: Diagnosis not present

## 2012-07-01 DIAGNOSIS — I10 Essential (primary) hypertension: Secondary | ICD-10-CM | POA: Diagnosis not present

## 2012-07-02 DIAGNOSIS — L97809 Non-pressure chronic ulcer of other part of unspecified lower leg with unspecified severity: Secondary | ICD-10-CM | POA: Diagnosis not present

## 2012-07-02 DIAGNOSIS — I739 Peripheral vascular disease, unspecified: Secondary | ICD-10-CM | POA: Diagnosis not present

## 2012-07-02 DIAGNOSIS — I872 Venous insufficiency (chronic) (peripheral): Secondary | ICD-10-CM | POA: Diagnosis not present

## 2012-07-02 DIAGNOSIS — E119 Type 2 diabetes mellitus without complications: Secondary | ICD-10-CM | POA: Diagnosis not present

## 2012-07-02 DIAGNOSIS — I10 Essential (primary) hypertension: Secondary | ICD-10-CM | POA: Diagnosis not present

## 2012-07-02 DIAGNOSIS — E669 Obesity, unspecified: Secondary | ICD-10-CM | POA: Diagnosis not present

## 2012-07-03 DIAGNOSIS — I7389 Other specified peripheral vascular diseases: Secondary | ICD-10-CM | POA: Diagnosis not present

## 2012-07-03 DIAGNOSIS — F411 Generalized anxiety disorder: Secondary | ICD-10-CM | POA: Diagnosis not present

## 2012-07-03 DIAGNOSIS — I10 Essential (primary) hypertension: Secondary | ICD-10-CM | POA: Diagnosis not present

## 2012-07-03 DIAGNOSIS — E662 Morbid (severe) obesity with alveolar hypoventilation: Secondary | ICD-10-CM | POA: Diagnosis not present

## 2012-07-03 DIAGNOSIS — E559 Vitamin D deficiency, unspecified: Secondary | ICD-10-CM | POA: Diagnosis not present

## 2012-07-03 DIAGNOSIS — G4733 Obstructive sleep apnea (adult) (pediatric): Secondary | ICD-10-CM | POA: Diagnosis not present

## 2012-07-08 DIAGNOSIS — R82998 Other abnormal findings in urine: Secondary | ICD-10-CM | POA: Diagnosis not present

## 2012-07-10 DIAGNOSIS — R82998 Other abnormal findings in urine: Secondary | ICD-10-CM | POA: Diagnosis not present

## 2012-07-10 DIAGNOSIS — I872 Venous insufficiency (chronic) (peripheral): Secondary | ICD-10-CM | POA: Diagnosis not present

## 2012-07-10 DIAGNOSIS — I739 Peripheral vascular disease, unspecified: Secondary | ICD-10-CM | POA: Diagnosis not present

## 2012-07-10 DIAGNOSIS — E119 Type 2 diabetes mellitus without complications: Secondary | ICD-10-CM | POA: Diagnosis not present

## 2012-07-10 DIAGNOSIS — E669 Obesity, unspecified: Secondary | ICD-10-CM | POA: Diagnosis not present

## 2012-07-10 DIAGNOSIS — I10 Essential (primary) hypertension: Secondary | ICD-10-CM | POA: Diagnosis not present

## 2012-07-10 DIAGNOSIS — L97809 Non-pressure chronic ulcer of other part of unspecified lower leg with unspecified severity: Secondary | ICD-10-CM | POA: Diagnosis not present

## 2012-07-16 ENCOUNTER — Encounter (HOSPITAL_BASED_OUTPATIENT_CLINIC_OR_DEPARTMENT_OTHER): Payer: Medicare Other | Attending: Internal Medicine

## 2012-07-16 DIAGNOSIS — I872 Venous insufficiency (chronic) (peripheral): Secondary | ICD-10-CM | POA: Insufficient documentation

## 2012-07-16 DIAGNOSIS — L97809 Non-pressure chronic ulcer of other part of unspecified lower leg with unspecified severity: Secondary | ICD-10-CM | POA: Insufficient documentation

## 2012-07-23 ENCOUNTER — Encounter (HOSPITAL_BASED_OUTPATIENT_CLINIC_OR_DEPARTMENT_OTHER): Payer: Medicare Other

## 2012-07-23 DIAGNOSIS — I872 Venous insufficiency (chronic) (peripheral): Secondary | ICD-10-CM | POA: Diagnosis not present

## 2012-07-23 DIAGNOSIS — L97809 Non-pressure chronic ulcer of other part of unspecified lower leg with unspecified severity: Secondary | ICD-10-CM | POA: Diagnosis not present

## 2012-07-29 DIAGNOSIS — E1159 Type 2 diabetes mellitus with other circulatory complications: Secondary | ICD-10-CM | POA: Diagnosis not present

## 2012-07-29 DIAGNOSIS — I10 Essential (primary) hypertension: Secondary | ICD-10-CM | POA: Diagnosis not present

## 2012-07-29 DIAGNOSIS — I5032 Chronic diastolic (congestive) heart failure: Secondary | ICD-10-CM | POA: Diagnosis not present

## 2012-07-29 DIAGNOSIS — G890 Central pain syndrome: Secondary | ICD-10-CM | POA: Diagnosis not present

## 2012-08-26 DIAGNOSIS — G890 Central pain syndrome: Secondary | ICD-10-CM | POA: Diagnosis not present

## 2012-08-26 DIAGNOSIS — I5032 Chronic diastolic (congestive) heart failure: Secondary | ICD-10-CM | POA: Diagnosis not present

## 2012-08-26 DIAGNOSIS — J301 Allergic rhinitis due to pollen: Secondary | ICD-10-CM | POA: Diagnosis not present

## 2012-08-26 DIAGNOSIS — I10 Essential (primary) hypertension: Secondary | ICD-10-CM | POA: Diagnosis not present

## 2012-08-26 DIAGNOSIS — E1159 Type 2 diabetes mellitus with other circulatory complications: Secondary | ICD-10-CM | POA: Diagnosis not present

## 2012-09-09 DIAGNOSIS — I10 Essential (primary) hypertension: Secondary | ICD-10-CM | POA: Diagnosis not present

## 2012-09-17 ENCOUNTER — Encounter (HOSPITAL_BASED_OUTPATIENT_CLINIC_OR_DEPARTMENT_OTHER): Payer: Medicare Other | Attending: Internal Medicine

## 2012-09-17 DIAGNOSIS — I83009 Varicose veins of unspecified lower extremity with ulcer of unspecified site: Secondary | ICD-10-CM | POA: Insufficient documentation

## 2012-09-17 DIAGNOSIS — L97909 Non-pressure chronic ulcer of unspecified part of unspecified lower leg with unspecified severity: Secondary | ICD-10-CM | POA: Insufficient documentation

## 2012-09-17 DIAGNOSIS — I872 Venous insufficiency (chronic) (peripheral): Secondary | ICD-10-CM | POA: Diagnosis not present

## 2012-09-29 DIAGNOSIS — M79609 Pain in unspecified limb: Secondary | ICD-10-CM | POA: Diagnosis not present

## 2012-09-29 DIAGNOSIS — B351 Tinea unguium: Secondary | ICD-10-CM | POA: Diagnosis not present

## 2012-10-01 DIAGNOSIS — I83009 Varicose veins of unspecified lower extremity with ulcer of unspecified site: Secondary | ICD-10-CM | POA: Diagnosis not present

## 2012-10-01 DIAGNOSIS — I872 Venous insufficiency (chronic) (peripheral): Secondary | ICD-10-CM | POA: Diagnosis not present

## 2012-10-01 DIAGNOSIS — Z23 Encounter for immunization: Secondary | ICD-10-CM | POA: Diagnosis not present

## 2012-10-08 DIAGNOSIS — L97909 Non-pressure chronic ulcer of unspecified part of unspecified lower leg with unspecified severity: Secondary | ICD-10-CM | POA: Diagnosis not present

## 2012-10-08 DIAGNOSIS — I83009 Varicose veins of unspecified lower extremity with ulcer of unspecified site: Secondary | ICD-10-CM | POA: Diagnosis not present

## 2012-10-08 DIAGNOSIS — I872 Venous insufficiency (chronic) (peripheral): Secondary | ICD-10-CM | POA: Diagnosis not present

## 2012-10-15 ENCOUNTER — Encounter (HOSPITAL_BASED_OUTPATIENT_CLINIC_OR_DEPARTMENT_OTHER): Payer: Medicare Other

## 2012-10-15 DIAGNOSIS — E1159 Type 2 diabetes mellitus with other circulatory complications: Secondary | ICD-10-CM | POA: Diagnosis not present

## 2012-10-15 DIAGNOSIS — E559 Vitamin D deficiency, unspecified: Secondary | ICD-10-CM | POA: Diagnosis not present

## 2012-10-15 DIAGNOSIS — I10 Essential (primary) hypertension: Secondary | ICD-10-CM | POA: Diagnosis not present

## 2012-10-15 DIAGNOSIS — G890 Central pain syndrome: Secondary | ICD-10-CM | POA: Diagnosis not present

## 2012-10-15 DIAGNOSIS — I5032 Chronic diastolic (congestive) heart failure: Secondary | ICD-10-CM | POA: Diagnosis not present

## 2012-10-16 DIAGNOSIS — E119 Type 2 diabetes mellitus without complications: Secondary | ICD-10-CM | POA: Diagnosis not present

## 2012-10-22 ENCOUNTER — Encounter (HOSPITAL_BASED_OUTPATIENT_CLINIC_OR_DEPARTMENT_OTHER): Payer: Medicare Other | Attending: Internal Medicine

## 2012-10-22 DIAGNOSIS — L97809 Non-pressure chronic ulcer of other part of unspecified lower leg with unspecified severity: Secondary | ICD-10-CM | POA: Diagnosis not present

## 2012-10-22 DIAGNOSIS — I872 Venous insufficiency (chronic) (peripheral): Secondary | ICD-10-CM | POA: Diagnosis not present

## 2012-11-12 DIAGNOSIS — L97809 Non-pressure chronic ulcer of other part of unspecified lower leg with unspecified severity: Secondary | ICD-10-CM | POA: Diagnosis not present

## 2012-11-12 DIAGNOSIS — I872 Venous insufficiency (chronic) (peripheral): Secondary | ICD-10-CM | POA: Diagnosis not present

## 2012-11-22 DIAGNOSIS — S31109A Unspecified open wound of abdominal wall, unspecified quadrant without penetration into peritoneal cavity, initial encounter: Secondary | ICD-10-CM | POA: Diagnosis not present

## 2012-11-24 DIAGNOSIS — J069 Acute upper respiratory infection, unspecified: Secondary | ICD-10-CM | POA: Diagnosis not present

## 2012-11-24 DIAGNOSIS — G4733 Obstructive sleep apnea (adult) (pediatric): Secondary | ICD-10-CM | POA: Diagnosis not present

## 2012-12-02 DIAGNOSIS — I10 Essential (primary) hypertension: Secondary | ICD-10-CM | POA: Diagnosis not present

## 2012-12-02 DIAGNOSIS — L97809 Non-pressure chronic ulcer of other part of unspecified lower leg with unspecified severity: Secondary | ICD-10-CM | POA: Diagnosis not present

## 2012-12-02 DIAGNOSIS — I5032 Chronic diastolic (congestive) heart failure: Secondary | ICD-10-CM | POA: Diagnosis not present

## 2012-12-02 DIAGNOSIS — E1159 Type 2 diabetes mellitus with other circulatory complications: Secondary | ICD-10-CM | POA: Diagnosis not present

## 2012-12-02 DIAGNOSIS — G890 Central pain syndrome: Secondary | ICD-10-CM | POA: Diagnosis not present

## 2012-12-10 ENCOUNTER — Encounter (HOSPITAL_BASED_OUTPATIENT_CLINIC_OR_DEPARTMENT_OTHER): Payer: Medicare Other | Attending: Internal Medicine

## 2012-12-10 DIAGNOSIS — L97809 Non-pressure chronic ulcer of other part of unspecified lower leg with unspecified severity: Secondary | ICD-10-CM | POA: Diagnosis not present

## 2012-12-10 DIAGNOSIS — I872 Venous insufficiency (chronic) (peripheral): Secondary | ICD-10-CM | POA: Insufficient documentation

## 2012-12-30 ENCOUNTER — Non-Acute Institutional Stay (SKILLED_NURSING_FACILITY): Payer: Medicare Other | Admitting: Adult Health

## 2012-12-30 DIAGNOSIS — I5032 Chronic diastolic (congestive) heart failure: Secondary | ICD-10-CM | POA: Diagnosis not present

## 2012-12-30 DIAGNOSIS — E559 Vitamin D deficiency, unspecified: Secondary | ICD-10-CM

## 2012-12-30 DIAGNOSIS — F3289 Other specified depressive episodes: Secondary | ICD-10-CM

## 2012-12-30 DIAGNOSIS — G8929 Other chronic pain: Secondary | ICD-10-CM

## 2012-12-30 DIAGNOSIS — I10 Essential (primary) hypertension: Secondary | ICD-10-CM

## 2012-12-30 DIAGNOSIS — E1159 Type 2 diabetes mellitus with other circulatory complications: Secondary | ICD-10-CM

## 2012-12-30 DIAGNOSIS — J301 Allergic rhinitis due to pollen: Secondary | ICD-10-CM

## 2012-12-30 DIAGNOSIS — F329 Major depressive disorder, single episode, unspecified: Secondary | ICD-10-CM

## 2012-12-30 DIAGNOSIS — K2100 Gastro-esophageal reflux disease with esophagitis, without bleeding: Secondary | ICD-10-CM

## 2012-12-31 ENCOUNTER — Encounter: Payer: Self-pay | Admitting: Adult Health

## 2012-12-31 DIAGNOSIS — I10 Essential (primary) hypertension: Secondary | ICD-10-CM

## 2012-12-31 DIAGNOSIS — E1159 Type 2 diabetes mellitus with other circulatory complications: Secondary | ICD-10-CM

## 2012-12-31 DIAGNOSIS — I5032 Chronic diastolic (congestive) heart failure: Secondary | ICD-10-CM | POA: Insufficient documentation

## 2012-12-31 DIAGNOSIS — G8929 Other chronic pain: Secondary | ICD-10-CM | POA: Insufficient documentation

## 2012-12-31 DIAGNOSIS — J301 Allergic rhinitis due to pollen: Secondary | ICD-10-CM | POA: Insufficient documentation

## 2012-12-31 DIAGNOSIS — F329 Major depressive disorder, single episode, unspecified: Secondary | ICD-10-CM | POA: Insufficient documentation

## 2012-12-31 DIAGNOSIS — E559 Vitamin D deficiency, unspecified: Secondary | ICD-10-CM | POA: Insufficient documentation

## 2012-12-31 DIAGNOSIS — K2100 Gastro-esophageal reflux disease with esophagitis, without bleeding: Secondary | ICD-10-CM

## 2012-12-31 HISTORY — DX: Gastro-esophageal reflux disease with esophagitis, without bleeding: K21.00

## 2012-12-31 HISTORY — DX: Vitamin D deficiency, unspecified: E55.9

## 2012-12-31 HISTORY — DX: Type 2 diabetes mellitus with other circulatory complications: E11.59

## 2012-12-31 HISTORY — DX: Essential (primary) hypertension: I10

## 2012-12-31 HISTORY — DX: Chronic diastolic (congestive) heart failure: I50.32

## 2012-12-31 HISTORY — DX: Other chronic pain: G89.29

## 2012-12-31 NOTE — Assessment & Plan Note (Signed)
She is stable and doing well at this time; is taking lasix 40 mg daily

## 2012-12-31 NOTE — Assessment & Plan Note (Addendum)
Stable is taking mag ox 400 mg daily

## 2012-12-31 NOTE — Progress Notes (Signed)
Patient ID: Heather Zimmerman, Heather Zimmerman   DOB: 02/16/1946, 67 y.o.   MRN: [id number]  Chief Complaint  Patient presents with  . Medical Managment of Chronic Issues   HPI: she is doing well; reassessment of chronic illnesses   Type II or unspecified type diabetes mellitus with peripheral circulatory disorders, not stated as uncontrolled(250.70) She is presently stable is taking lantus 5 units daily takes novolog 5 units prior to meals for cbg>=150  Depressive disorder, not elsewhere classified Her mood state is doing well with zoloft 50 mg daily and takes klonopin nightly for anxiety   Unspecified vitamin D deficiency Takes vit d 1000 units daily   GERD Stable is taking protonix 40 mg daily   Allergic rhinitis due to pollen She is voicing no complaints is taking flonase 2 puffs per nare nightly   Disorders of magnesium metabolism Stable is taking mag ox 400 mg daily   Chronic pain Is not voicing any complaints of pain is taking elavil  10 mg nightly and takes neurontin 300 mg twice daily   Essential hypertension, benign She is presently stable is taking cozaar 25 mg daily   Chronic diastolic heart failure She is stable and doing well at this time; is taking lasix 40 mg daily   Past Surgical History  Procedure Laterality Date  . Leg amputation below knee Right 01/03/2012     Patient's Medications  New Prescriptions   No medications on file  Previous Medications   AMITRIPTYLINE (ELAVIL) 10 MG TABLET    Take 10 mg by mouth at bedtime.   CHOLECALCIFEROL (VITAMIN D) 1000 UNITS TABLET    Take 1,000 Units by mouth daily.   CLONAZEPAM (KLONOPIN) 0.5 MG TABLET    Take 0.5 mg by mouth 2 (two) times daily as needed for anxiety.   FLUTICASONE PROPIONATE (FLONASE NA)    Place into the nose.   FUROSEMIDE (LASIX) 40 MG TABLET    Take 40 mg by mouth daily.   GABAPENTIN (NEURONTIN) 300 MG CAPSULE    Take 300 mg by mouth 2 times daily at 12 noon and 4 pm.   INSULIN ASPART (NOVOLOG) 100  UNIT/ML INJECTION    Inject 5 Units into the skin 3 (three) times daily before meals. 5 units daily before meals for cbg > or = to 150   INSULIN GLARGINE (LANTUS) 100 UNIT/ML INJECTION    Inject into the skin at bedtime.   LOSARTAN (COZAAR) 25 MG TABLET    Take 25 mg by mouth daily.   MAGNESIUM OXIDE (MAG-OX) 400 MG TABLET    Take 400 mg by mouth daily.   PANTOPRAZOLE (PROTONIX) 40 MG TABLET    Take 40 mg by mouth daily.   SERTRALINE (ZOLOFT) 50 MG TABLET    Take 50 mg by mouth daily.  Modified Medications   No medications on file  Discontinued Medications   No medications on file   Review of Systems  Constitutional: Negative.   Respiratory: Negative for cough, shortness of breath and wheezing.   Cardiovascular: Positive for leg swelling. Negative for chest pain and palpitations.  Gastrointestinal: Negative for heartburn, nausea and abdominal pain.  Musculoskeletal: Negative for myalgias and joint pain.  Skin:       Has chronic ulceration on left lower leg  Neurological: Negative for headaches.  Psychiatric/Behavioral: Negative for depression. The patient is not nervous/anxious and does not have insomnia.    Filed Vitals:   12/28/12 1102  BP: 144/70  Pulse: 84  Temp: 98  F (36.7 C)  Resp: 20  Height: 5\' 2"  (1.575 m)  Weight: 316 lb (143.337 kg)   Physical Exam  Constitutional: She is oriented to person, place, and time. She appears well-developed and well-nourished.  HENT:  Head: Normocephalic.  Neck: Neck supple.  Cardiovascular: Normal rate, regular rhythm and normal heart sounds.   Pulmonary/Chest: Effort normal and breath sounds normal.  Abdominal: Soft. Bowel sounds are normal.  Musculoskeletal: Normal range of motion.  Right bka  Neurological: She is alert and oriented to person, place, and time.  Skin: Skin is warm and dry.  Left lower leg venous stasis ulcer: 9.0 x 12.0 cm 100% slough on wound bed there is hypergranulation tissue present small amount; no signs of  infection present  Psychiatric: She has a normal mood and affect.    Labs from 07-04-12: Creatinine: 0.99Potassium: 4.7 Albumin: 3.0 Glucose: 79 BUN: 20 liver normal Iron 45; tibc 231; ab retic 48.2; tsh 2.848; vit b12: 1079; folate >20; ferritin 74   09-09-12: glucose 75; bun 28; creat 1.12; k+ 4.4 na++ 148 11-26-12: wound culture (left leg) mrsa: doxycyline   Assessment Patient Active Problem List  Diagnosis  . Type II or unspecified type diabetes mellitus with peripheral circulatory disorders, not stated as uncontrolled(250.70)  . Unspecified vitamin D deficiency  . Disorders of magnesium metabolism  . Depressive disorder, not elsewhere classified  . Chronic pain  . Essential hypertension, benign  . Chronic diastolic heart failure  . Allergic rhinitis due to pollen  . GERD    Plan: Patient's condition is stable; no changes in medications are recommended. We will continue to monitor and make changes as necessary.

## 2012-12-31 NOTE — Assessment & Plan Note (Signed)
She is voicing no complaints is taking flonase 2 puffs per nare nightly

## 2012-12-31 NOTE — Assessment & Plan Note (Signed)
Is not voicing any complaints of pain is taking elavil  10 mg nightly and takes neurontin 300 mg twice daily

## 2012-12-31 NOTE — Assessment & Plan Note (Signed)
Takes vit d 1000 units daily

## 2012-12-31 NOTE — Assessment & Plan Note (Signed)
She is presently stable is taking lantus 5 units daily takes novolog 5 units prior to meals for cbg>=150

## 2012-12-31 NOTE — Assessment & Plan Note (Signed)
She is presently stable is taking cozaar 25 mg daily

## 2012-12-31 NOTE — Assessment & Plan Note (Signed)
Her mood state is doing well with zoloft 50 mg daily and takes klonopin nightly for anxiety

## 2012-12-31 NOTE — Assessment & Plan Note (Signed)
Stable is taking protonix 40 mg daily

## 2013-01-04 DIAGNOSIS — M79609 Pain in unspecified limb: Secondary | ICD-10-CM | POA: Diagnosis not present

## 2013-01-04 DIAGNOSIS — E119 Type 2 diabetes mellitus without complications: Secondary | ICD-10-CM | POA: Diagnosis not present

## 2013-01-04 DIAGNOSIS — B351 Tinea unguium: Secondary | ICD-10-CM | POA: Diagnosis not present

## 2013-01-07 ENCOUNTER — Encounter (HOSPITAL_BASED_OUTPATIENT_CLINIC_OR_DEPARTMENT_OTHER): Payer: Medicare Other | Attending: Internal Medicine

## 2013-01-07 DIAGNOSIS — L97809 Non-pressure chronic ulcer of other part of unspecified lower leg with unspecified severity: Secondary | ICD-10-CM | POA: Diagnosis not present

## 2013-01-07 DIAGNOSIS — I872 Venous insufficiency (chronic) (peripheral): Secondary | ICD-10-CM | POA: Insufficient documentation

## 2013-01-07 DIAGNOSIS — S88119A Complete traumatic amputation at level between knee and ankle, unspecified lower leg, initial encounter: Secondary | ICD-10-CM | POA: Insufficient documentation

## 2013-01-07 DIAGNOSIS — E119 Type 2 diabetes mellitus without complications: Secondary | ICD-10-CM | POA: Insufficient documentation

## 2013-01-07 DIAGNOSIS — I8310 Varicose veins of unspecified lower extremity with inflammation: Secondary | ICD-10-CM | POA: Insufficient documentation

## 2013-01-07 DIAGNOSIS — Z79899 Other long term (current) drug therapy: Secondary | ICD-10-CM | POA: Insufficient documentation

## 2013-01-14 ENCOUNTER — Encounter (HOSPITAL_BASED_OUTPATIENT_CLINIC_OR_DEPARTMENT_OTHER): Payer: Medicare Other | Attending: Internal Medicine

## 2013-01-14 DIAGNOSIS — I872 Venous insufficiency (chronic) (peripheral): Secondary | ICD-10-CM | POA: Insufficient documentation

## 2013-01-14 DIAGNOSIS — L97809 Non-pressure chronic ulcer of other part of unspecified lower leg with unspecified severity: Secondary | ICD-10-CM | POA: Insufficient documentation

## 2013-01-19 DIAGNOSIS — H2510 Age-related nuclear cataract, unspecified eye: Secondary | ICD-10-CM | POA: Diagnosis not present

## 2013-01-19 DIAGNOSIS — E119 Type 2 diabetes mellitus without complications: Secondary | ICD-10-CM | POA: Diagnosis not present

## 2013-01-19 DIAGNOSIS — H40019 Open angle with borderline findings, low risk, unspecified eye: Secondary | ICD-10-CM | POA: Diagnosis not present

## 2013-02-04 DIAGNOSIS — L97809 Non-pressure chronic ulcer of other part of unspecified lower leg with unspecified severity: Secondary | ICD-10-CM | POA: Diagnosis not present

## 2013-02-04 DIAGNOSIS — I872 Venous insufficiency (chronic) (peripheral): Secondary | ICD-10-CM | POA: Diagnosis not present

## 2013-02-25 ENCOUNTER — Encounter (HOSPITAL_BASED_OUTPATIENT_CLINIC_OR_DEPARTMENT_OTHER): Payer: Medicare Other | Attending: Internal Medicine

## 2013-02-25 DIAGNOSIS — L97809 Non-pressure chronic ulcer of other part of unspecified lower leg with unspecified severity: Secondary | ICD-10-CM | POA: Diagnosis not present

## 2013-02-25 DIAGNOSIS — I872 Venous insufficiency (chronic) (peripheral): Secondary | ICD-10-CM | POA: Insufficient documentation

## 2013-03-18 ENCOUNTER — Encounter (HOSPITAL_BASED_OUTPATIENT_CLINIC_OR_DEPARTMENT_OTHER): Payer: Medicare Other | Attending: Internal Medicine

## 2013-03-18 DIAGNOSIS — L97809 Non-pressure chronic ulcer of other part of unspecified lower leg with unspecified severity: Secondary | ICD-10-CM | POA: Diagnosis not present

## 2013-03-18 DIAGNOSIS — I872 Venous insufficiency (chronic) (peripheral): Secondary | ICD-10-CM | POA: Insufficient documentation

## 2013-03-29 ENCOUNTER — Other Ambulatory Visit: Payer: Self-pay | Admitting: Geriatric Medicine

## 2013-03-29 MED ORDER — CLONAZEPAM 0.5 MG PO TABS: ORAL_TABLET | ORAL | Status: DC

## 2013-04-15 ENCOUNTER — Encounter (HOSPITAL_BASED_OUTPATIENT_CLINIC_OR_DEPARTMENT_OTHER): Payer: Medicare Other | Attending: Internal Medicine

## 2013-04-15 DIAGNOSIS — L97809 Non-pressure chronic ulcer of other part of unspecified lower leg with unspecified severity: Secondary | ICD-10-CM | POA: Insufficient documentation

## 2013-04-15 DIAGNOSIS — I872 Venous insufficiency (chronic) (peripheral): Secondary | ICD-10-CM | POA: Insufficient documentation

## 2013-04-22 ENCOUNTER — Encounter: Payer: Self-pay | Admitting: Internal Medicine

## 2013-04-22 ENCOUNTER — Non-Acute Institutional Stay (SKILLED_NURSING_FACILITY): Payer: Medicare Other | Admitting: Internal Medicine

## 2013-04-22 DIAGNOSIS — I10 Essential (primary) hypertension: Secondary | ICD-10-CM

## 2013-04-22 DIAGNOSIS — F329 Major depressive disorder, single episode, unspecified: Secondary | ICD-10-CM

## 2013-04-22 DIAGNOSIS — G8929 Other chronic pain: Secondary | ICD-10-CM | POA: Diagnosis not present

## 2013-04-22 DIAGNOSIS — E559 Vitamin D deficiency, unspecified: Secondary | ICD-10-CM

## 2013-04-22 DIAGNOSIS — E1159 Type 2 diabetes mellitus with other circulatory complications: Secondary | ICD-10-CM

## 2013-04-22 NOTE — Progress Notes (Signed)
Patient ID: Heather Zimmerman. [place], female   DOB: 1946-09-26, 67 y.o.   MRN: [id number]  [name] living [place]  Code Status: FULL CODE  Allergies  Allergen Reactions  . Ace Inhibitors     Chief Complaint  Patient presents with  . Medical Managment of Chronic Issues    HPI:  Patient seen today for medical management of chronic illness. She complaints of pain in her left leg where the una boot is. She also would like to focus on losing weight and wants to know about changing her dietary intake Reviewed her medications. No concerns from staff cbg has been well controlled Mood has been stable Reflux has been under control  Review of Systems  Constitutional: Negative for fever, chills and diaphoresis.  HENT: Negative for congestion.   Eyes: Negative for blurred vision.  Respiratory: Negative for cough and shortness of breath.   Cardiovascular: Negative for chest pain and palpitations.  Gastrointestinal: Negative for heartburn, nausea, vomiting and abdominal pain.  Genitourinary: Negative for dysuria.  Musculoskeletal: Positive for joint pain. Negative for falls.  Skin: Negative for rash.  Neurological: Negative for dizziness, focal weakness, loss of consciousness, weakness and headaches.  Psychiatric/Behavioral: Negative for depression. The patient has insomnia.     PMH- DM, HTN, DEPRESSION, NEUROPATHY  Past Surgical History  Procedure Laterality Date  . Leg amputation below knee Right 01/03/2012   Social History:   reports that she has never smoked. She does not have any smokeless tobacco history on file. Her alcohol and drug histories are not on file.  No family history on file.  Medications: Patient's Medications  New Prescriptions   No medications on file  Previous Medications   ACETAMINOPHEN (TYLENOL EXTRA STRENGTH PO)    Take 1,000 mg by mouth. 2 TAB EVERY 6 HOURS AS NEEDED   AMITRIPTYLINE (ELAVIL) 10 MG TABLET    Take 10 mg by mouth at bedtime.   CHOLECALCIFEROL  (VITAMIN D) 1000 UNITS TABLET    Take 1,000 Units by mouth daily.   CLONAZEPAM (KLONOPIN) 0.5 MG TABLET    Take one tablet by mouth at bedtime.   FLUTICASONE PROPIONATE (FLONASE NA)    Place into the nose.   FOLIC ACID (FOLVITE) 1 MG TABLET    Take 1 mg by mouth daily.   FUROSEMIDE (LASIX) 40 MG TABLET    Take 40 mg by mouth daily.   GABAPENTIN (NEURONTIN) 300 MG CAPSULE    Take 300 mg by mouth 2 times daily at 12 noon and 4 pm.   HYDROCORTISONE 1 % LOTION    Apply 1 application topically 2 (two) times daily as needed.   INSULIN ASPART (NOVOLOG) 100 UNIT/ML INJECTION    Inject 5 Units into the skin 3 (three) times daily before meals. 5 units daily before meals for cbg > or = to 150   INSULIN GLARGINE (LANTUS) 100 UNIT/ML INJECTION    Inject 5 Units into the skin at bedtime.    LOSARTAN (COZAAR) 25 MG TABLET    Take 25 mg by mouth daily.   MAGNESIUM OXIDE (MAG-OX) 400 MG TABLET    Take 400 mg by mouth daily.   OMEPRAZOLE (PRILOSEC) 20 MG CAPSULE    Take 20 mg by mouth daily.   SERTRALINE (ZOLOFT) 50 MG TABLET    Take 50 mg by mouth daily.   TRAMADOL (ULTRAM) 50 MG TABLET    Take 50 mg by mouth every 6 (six) hours as needed for pain.  Modified Medications   No  medications on file  Discontinued Medications   PANTOPRAZOLE (PROTONIX) 40 MG TABLET    Take 40 mg by mouth daily.    Physical Exam: BP 130/70  Pulse 76  Temp(Src) 96.8 F (36 C)  Resp 18  SpO2 94%  gen- morbidly obese female in NAD HEENT- no pallor, no icterus, no LAD, MMM cvs- ns 1, s2, rrr respi- CTAB abdo- bs+, soft, non tender Ext- right BKA, left leg in una boot, able to move all 4 Neuro- aaox 3, no focal loss Psych- normal affect this visit, calm   Labs reviewed: 1/14 5.9 11/13 bun 28, cr 1.12, glu 75, rest wnl  Assessment/Plan  Left leg pain- after loosening of the una boot, her pain subsided and current regimen otherwise has been helpful.will not make medication changes. Monitor clinically  DM- monitor cbg.  Currently cbg mostly between 94- 148, last a1c 5.9 in jan. Will stop lantus and will continue SSI. Continue ARB. Not on statin, will start her on simvastatin 10 mg po daily. Will also add baby ASA to her regimen. Check a1c and flp. Will provide dietary consult as pt would prefer low carb snacks  Anxiety- continue clonazepam for now  Depression- continue amitriptyline and zoloft for now, monitor  HTN- continue losartan and lasix for now, monitor bp readings. Check cmp  GERD- continue omeprazole for now and monitor her symptoms  Vitamin d def- continue vit d and check vitamin d level  Labs/tests ordered- cbc, cmp, a1c, flp, vit d

## 2013-04-23 DIAGNOSIS — E559 Vitamin D deficiency, unspecified: Secondary | ICD-10-CM | POA: Diagnosis not present

## 2013-04-23 DIAGNOSIS — I10 Essential (primary) hypertension: Secondary | ICD-10-CM | POA: Diagnosis not present

## 2013-04-23 DIAGNOSIS — E119 Type 2 diabetes mellitus without complications: Secondary | ICD-10-CM | POA: Diagnosis not present

## 2013-05-06 DIAGNOSIS — I872 Venous insufficiency (chronic) (peripheral): Secondary | ICD-10-CM | POA: Diagnosis not present

## 2013-05-06 DIAGNOSIS — L97809 Non-pressure chronic ulcer of other part of unspecified lower leg with unspecified severity: Secondary | ICD-10-CM | POA: Diagnosis not present

## 2013-05-24 DIAGNOSIS — F329 Major depressive disorder, single episode, unspecified: Secondary | ICD-10-CM | POA: Diagnosis not present

## 2013-06-03 ENCOUNTER — Encounter (HOSPITAL_BASED_OUTPATIENT_CLINIC_OR_DEPARTMENT_OTHER): Payer: Medicare Other | Attending: Internal Medicine

## 2013-06-03 DIAGNOSIS — I872 Venous insufficiency (chronic) (peripheral): Secondary | ICD-10-CM | POA: Insufficient documentation

## 2013-06-03 DIAGNOSIS — L97809 Non-pressure chronic ulcer of other part of unspecified lower leg with unspecified severity: Secondary | ICD-10-CM | POA: Insufficient documentation

## 2013-06-16 ENCOUNTER — Non-Acute Institutional Stay (SKILLED_NURSING_FACILITY): Payer: Medicare Other | Admitting: Adult Health

## 2013-06-16 DIAGNOSIS — I5032 Chronic diastolic (congestive) heart failure: Secondary | ICD-10-CM

## 2013-06-16 DIAGNOSIS — J301 Allergic rhinitis due to pollen: Secondary | ICD-10-CM

## 2013-06-16 DIAGNOSIS — G8929 Other chronic pain: Secondary | ICD-10-CM

## 2013-06-16 DIAGNOSIS — E1159 Type 2 diabetes mellitus with other circulatory complications: Secondary | ICD-10-CM | POA: Diagnosis not present

## 2013-06-16 DIAGNOSIS — F329 Major depressive disorder, single episode, unspecified: Secondary | ICD-10-CM

## 2013-06-16 DIAGNOSIS — I10 Essential (primary) hypertension: Secondary | ICD-10-CM | POA: Diagnosis not present

## 2013-06-16 DIAGNOSIS — F3289 Other specified depressive episodes: Secondary | ICD-10-CM

## 2013-06-16 DIAGNOSIS — K2100 Gastro-esophageal reflux disease with esophagitis, without bleeding: Secondary | ICD-10-CM

## 2013-07-01 ENCOUNTER — Encounter (HOSPITAL_BASED_OUTPATIENT_CLINIC_OR_DEPARTMENT_OTHER): Payer: Medicare Other | Attending: Internal Medicine

## 2013-07-01 DIAGNOSIS — L97809 Non-pressure chronic ulcer of other part of unspecified lower leg with unspecified severity: Secondary | ICD-10-CM | POA: Diagnosis not present

## 2013-07-01 DIAGNOSIS — I872 Venous insufficiency (chronic) (peripheral): Secondary | ICD-10-CM | POA: Insufficient documentation

## 2013-07-14 ENCOUNTER — Non-Acute Institutional Stay (SKILLED_NURSING_FACILITY): Payer: Medicare Other | Admitting: Adult Health

## 2013-07-14 DIAGNOSIS — F329 Major depressive disorder, single episode, unspecified: Secondary | ICD-10-CM

## 2013-07-14 DIAGNOSIS — I10 Essential (primary) hypertension: Secondary | ICD-10-CM

## 2013-07-14 DIAGNOSIS — J301 Allergic rhinitis due to pollen: Secondary | ICD-10-CM | POA: Diagnosis not present

## 2013-07-14 DIAGNOSIS — I5032 Chronic diastolic (congestive) heart failure: Secondary | ICD-10-CM

## 2013-07-14 DIAGNOSIS — G8929 Other chronic pain: Secondary | ICD-10-CM

## 2013-07-14 DIAGNOSIS — E1159 Type 2 diabetes mellitus with other circulatory complications: Secondary | ICD-10-CM

## 2013-07-15 ENCOUNTER — Encounter: Payer: Self-pay | Admitting: Adult Health

## 2013-07-15 NOTE — Assessment & Plan Note (Signed)
Will continue mag ox 400 mg three times daily

## 2013-07-15 NOTE — Assessment & Plan Note (Signed)
Will continue prilosec daily

## 2013-07-15 NOTE — Assessment & Plan Note (Addendum)
She is presently emotionally stable with zoloft 50 mg daily and klonopin 0.5 mg nightly for anxiety will not make changes will monitor her status

## 2013-07-15 NOTE — Assessment & Plan Note (Addendum)
She is stable is taking lantus 5 units daily and novolog 5 units prior to meals for cbg >=150 will not make changes and will monitor her status

## 2013-07-15 NOTE — Progress Notes (Signed)
Patient ID: Heather Zimmerman. [place], female   DOB: 1946-06-09, 67 y.o.   MRN: [id number]  [facility]  Allergies  Allergen Reactions  . Ace Inhibitors     Chief Complaint  Patient presents with  . Medical Managment of Chronic Issues    HPI  She is being seen for the management of her chronic illnesses. She is not voicing any complaints today. She tells me that she is feeling good and is doing well. There are no concerns being voiced by the nursing staff. Her overall status is stable.    Past Medical History  Diagnosis Date  . 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 Essential hypertension, benign 12/31/2012  . Chronic diastolic heart failure 12/31/2012  . GERD 12/31/2012  . Unspecified vitamin D deficiency 12/31/2012  . Chronic pain 12/31/2012  . Venous stasis ulcers   . Venous insufficiency (chronic) (peripheral)       Past Surgical History  Procedure Laterality Date  . Leg amputation below knee Right 01/03/2012    Current Outpatient Prescriptions on File Prior to Visit  Medication Sig Dispense Refill  . Acetaminophen (TYLENOL EXTRA STRENGTH PO) Take 1,000 mg by mouth. 2 TAB EVERY 6 HOURS AS NEEDED      . amitriptyline (ELAVIL) 10 MG tablet Take 10 mg by mouth at bedtime.      . cholecalciferol (VITAMIN D) 1000 UNITS tablet Take 1,000 Units by mouth daily.      . clonazePAM (KLONOPIN) 0.5 MG tablet Take one tablet by mouth at bedtime.  30 tablet  5  . Fluticasone Propionate (FLONASE NA) Place into the nose.      . folic acid (FOLVITE) 1 MG tablet Take 1 mg by mouth daily.      . furosemide (LASIX) 40 MG tablet Take 40 mg by mouth daily.      Marland Kitchen gabapentin (NEURONTIN) 300 MG capsule Take 300 mg by mouth 2 times daily at 12 noon and 4 pm.      . hydrocortisone 1 % lotion Apply 1 application topically 2 (two) times daily as needed.      . insulin aspart (NOVOLOG) 100 UNIT/ML injection Inject 5  Units into the skin 3 (three) times daily before meals. 5 units daily before meals for cbg > or = to 150      . insulin glargine (LANTUS) 100 UNIT/ML injection Inject 5 Units into the skin at bedtime.       Marland Kitchen losartan (COZAAR) 25 MG tablet Take 25 mg by mouth daily.      . magnesium oxide (MAG-OX) 400 MG tablet Take 400 mg by mouth 3 (three) times daily.       Marland Kitchen omeprazole (PRILOSEC) 20 MG capsule Take 20 mg by mouth daily.      . sertraline (ZOLOFT) 50 MG tablet Take 50 mg by mouth daily.      . traMADol (ULTRAM) 50 MG tablet Take 50 mg by mouth every 6 (six) hours as needed for pain.       No current facility-administered medications on file prior to visit.     Filed Vitals:   07/15/13 2110  BP: 127/68  Pulse: 79  Height: 5\' 2"  (1.575 m)  Weight: 329 lb (149.233 kg)    SIGNIFICANT STUDIES  LABS REVIEWED:   04-23-13: wbc 5.4; hgb 10.0; hct 30.6; mcv 82.0; plt 214; glucose 89; bun 42; creat 1.33; k+4.5; na++144; liver normal albumin 3.0; chol  165; ldl 108; trig 84; hgb a1c 5.8; vit d 39    Review of Systems  Constitutional: Negative.   Respiratory: Negative for cough, shortness of breath and wheezing.   Cardiovascular: Positive for leg swelling. Negative for chest pain and palpitations.  Gastrointestinal: Negative for heartburn, nausea and abdominal pain.  Musculoskeletal: Negative for myalgias and joint pain.  Skin:       Has chronic ulceration on left lower leg  Neurological: Negative for headaches.  Psychiatric/Behavioral: Negative for depression. The patient is not nervous/anxious and does not have insomnia.      Physical Exam  Constitutional: She is oriented to person, place, and time. She appears well-developed and well-nourished.  HENT:  Head: Normocephalic.  Neck: Neck supple.  Cardiovascular: Normal rate, regular rhythm and normal heart sounds.   Pulmonary/Chest: Effort normal and breath sounds normal.  Abdominal: Soft. Bowel sounds are normal.   Musculoskeletal: Normal range of motion.  Right bka  Neurological: She is alert and oriented to person, place, and time.  Skin: Skin is warm and dry.  Left lower leg venous stasis ulcer:no signs of infection present  Psychiatric: She has a normal mood and affect.     ASSESSMENT PLAN  Type II or unspecified type diabetes mellitus with peripheral circulatory disorders, not stated as uncontrolled(250.70)  She is stable is taking lantus 5 units daily and novolog 5 units prior to meals for cbg >=150 will not make changes and will monitor her status  Essential hypertension, benign  Is stable will continue cozaar 25 mg daily and asa 81 mg daily  Allergic rhinitis due to pollen  Will continue flonase daily  GERD  Will continue prilosec daily  Disorders of magnesium metabolism  Will continue mag ox 400 mg three times daily  Depressive disorder, not elsewhere classified  She is presently emotionally stable with zoloft 50 mg daily and klonopin 0.5 mg nightly for anxiety will not make changes will monitor her status  Chronic pain  Her pain is being adequately managed with neurontin 300 mg twice daily amitriptyline 10 mg nightly has tylenol 1 gm every 6 hours as needed for pain and ultram 50 mg every 6 hours as needed for pain.  Chronic diastolic heart failure  She is presently will continue lasix 40 mg daily and will monitor her status

## 2013-07-15 NOTE — Assessment & Plan Note (Signed)
Will continue flonase daily

## 2013-07-15 NOTE — Assessment & Plan Note (Signed)
Her pain is being adequately managed with neurontin 300 mg twice daily amitriptyline 10 mg nightly has tylenol 1 gm every 6 hours as needed for pain and ultram 50 mg every 6 hours as needed for pain.

## 2013-07-15 NOTE — Assessment & Plan Note (Addendum)
Is stable will continue cozaar 25 mg daily and asa 81 mg daily

## 2013-07-15 NOTE — Progress Notes (Signed)
Patient ID: Heather Zimmerman, female   DOB: 28-Dec-1945, 67 y.o.   MRN: [id number]  [facility]  Allergies  Allergen Reactions  . Ace Inhibitors      Chief Complaint  Patient presents with  . Medical Managment of Chronic Issues    HPI: She is being seen for the management of her chronic illnesses. She is not voicing any complaints today. The nursing staff is not voicing any concerns. 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    No past medical history on file.  Past Surgical History  Procedure Laterality Date  . Leg amputation below knee Right 01/03/2012    VITAL SIGNS BP 126/68  Pulse 68  Ht 5\' 2"  (1.575 m)  Wt 336 lb (152.409 kg)  BMI 61.44 kg/m2   Patient's Medications  New Prescriptions   No medications on file  Previous Medications   ACETAMINOPHEN (TYLENOL EXTRA STRENGTH PO)    Take 1,000 mg by mouth. 2 TAB EVERY 6 HOURS AS NEEDED   AMITRIPTYLINE (ELAVIL) 10 MG TABLET    Take 10 mg by mouth at bedtime.   ASPIRIN 81 MG TABLET    Take 81 mg by mouth daily.   CHOLECALCIFEROL (VITAMIN D) 1000 UNITS TABLET    Take 1,000 Units by mouth daily.   CLONAZEPAM (KLONOPIN) 0.5 MG TABLET    Take one tablet by mouth at bedtime.   FLUTICASONE PROPIONATE (FLONASE NA)    Place into the nose.   FOLIC ACID (FOLVITE) 1 MG TABLET    Take 1 mg by mouth daily.   FUROSEMIDE (LASIX) 40 MG TABLET    Take 40 mg by mouth daily.   GABAPENTIN (NEURONTIN) 300 MG CAPSULE    Take 300 mg by mouth 2 times daily at 12 noon and 4 pm.   HYDROCORTISONE 1 % LOTION    Apply 1 application topically 2 (two) times daily as needed.   INSULIN ASPART (NOVOLOG) 100 UNIT/ML INJECTION    Inject 5 Units into the skin 3 (three) times daily before meals. 5 units daily before meals for cbg > or = to 150   INSULIN GLARGINE (LANTUS) 100 UNIT/ML INJECTION    Inject 5 Units into the skin at bedtime.    LOSARTAN  (COZAAR) 25 MG TABLET    Take 25 mg by mouth daily.   MAGNESIUM OXIDE (MAG-OX) 400 MG TABLET    Take 400 mg by mouth 3 (three) times daily.    MULTIPLE VITAMINS PO    Take 1 tablet by mouth daily.   OMEPRAZOLE (PRILOSEC) 20 MG CAPSULE    Take 20 mg by mouth daily.   SERTRALINE (ZOLOFT) 50 MG TABLET    Take 50 mg by mouth daily.   TRAMADOL (ULTRAM) 50 MG TABLET    Take 50 mg by mouth every 6 (six) hours as needed for pain.  Modified Medications   No medications on file  Discontinued Medications   No medications on file    SIGNIFICANT DIAGNOSTIC EXAMS   LABS REVIEWED:   11-26-12: wound culture (left leg) mrsa: doxycyline  04-23-13: wbc 5.4; hgb 10.0; hct 30.6; mcv 82.0; plt 214; glucose 89; bun 42; creat 1.33; k+4.5; na++144; liver normal albumin 3.0; chol 165; ldl 108; trig 84; hgb a1c 5.8; vit d 39     Review of Systems  Constitutional: Negative.   Respiratory: Negative for cough, shortness of breath  and wheezing.   Cardiovascular: Positive for leg swelling. Negative for chest pain and palpitations.  Gastrointestinal: Negative for heartburn, nausea and abdominal pain.  Musculoskeletal: Negative for myalgias and joint pain.  Skin:       Has chronic ulceration on left lower leg  Neurological: Negative for headaches.  Psychiatric/Behavioral: Negative for depression. The patient is not nervous/anxious and does not have insomnia.       Physical Exam  Constitutional: She is oriented to person, place, and time. She appears well-developed and well-nourished.  HENT:  Head: Normocephalic.  Neck: Neck supple.  Cardiovascular: Normal rate, regular rhythm and normal heart sounds.   Pulmonary/Chest: Effort normal and breath sounds normal.  Abdominal: Soft. Bowel sounds are normal.  Musculoskeletal: Normal range of motion.  Right bka  Neurological: She is alert and oriented to person, place, and time.  Skin: Skin is warm and dry.  Left lower leg venous stasis ulcer: no signs of  infection present; dressing intact Psychiatric: She has a normal mood and affect.     ASSESSMENT/ PLAN:  Type II or unspecified type diabetes mellitus with peripheral circulatory disorders, not stated as uncontrolled(250.70) She is stable is taking lantus 5 units daily and novolog 5 units prior to meals for cbg >=150 will not make changes and will monitor her status   Essential hypertension, benign Is stable will continue cozaar 25 mg daily and asa 81 mg daily   Allergic rhinitis due to pollen Will continue flonase daily   GERD Will continue prilosec daily   Disorders of magnesium metabolism Will continue mag ox 400 mg three times daily   Depressive disorder, not elsewhere classified She is presently emotionally stable with zoloft 50 mg daily and klonopin 0.5 mg nightly for anxiety will not make changes will monitor her status   Chronic pain Her pain is being adequately managed with neurontin 300 mg twice daily amitriptyline 10 mg nightly has tylenol 1 gm every 6 hours as needed for pain and ultram 50 mg every 6 hours as needed for pain.   Chronic diastolic heart failure She is presently will continue lasix 40 mg daily and will monitor her status    Time spent with patient 50 minutes.

## 2013-07-15 NOTE — Assessment & Plan Note (Signed)
She is presently will continue lasix 40 mg daily and will monitor her status

## 2013-07-19 DIAGNOSIS — E119 Type 2 diabetes mellitus without complications: Secondary | ICD-10-CM | POA: Diagnosis not present

## 2013-07-19 DIAGNOSIS — H2510 Age-related nuclear cataract, unspecified eye: Secondary | ICD-10-CM | POA: Diagnosis not present

## 2013-07-19 DIAGNOSIS — H40019 Open angle with borderline findings, low risk, unspecified eye: Secondary | ICD-10-CM | POA: Diagnosis not present

## 2013-07-22 ENCOUNTER — Encounter (HOSPITAL_BASED_OUTPATIENT_CLINIC_OR_DEPARTMENT_OTHER): Payer: Medicare Other | Attending: Internal Medicine

## 2013-07-22 DIAGNOSIS — L97809 Non-pressure chronic ulcer of other part of unspecified lower leg with unspecified severity: Secondary | ICD-10-CM | POA: Insufficient documentation

## 2013-07-22 DIAGNOSIS — I872 Venous insufficiency (chronic) (peripheral): Secondary | ICD-10-CM | POA: Insufficient documentation

## 2013-08-03 ENCOUNTER — Non-Acute Institutional Stay (SKILLED_NURSING_FACILITY): Payer: Medicare Other | Admitting: Internal Medicine

## 2013-08-03 DIAGNOSIS — I5032 Chronic diastolic (congestive) heart failure: Secondary | ICD-10-CM | POA: Diagnosis not present

## 2013-08-03 DIAGNOSIS — F329 Major depressive disorder, single episode, unspecified: Secondary | ICD-10-CM

## 2013-08-03 DIAGNOSIS — E1159 Type 2 diabetes mellitus with other circulatory complications: Secondary | ICD-10-CM

## 2013-08-03 DIAGNOSIS — H00019 Hordeolum externum unspecified eye, unspecified eyelid: Secondary | ICD-10-CM | POA: Diagnosis not present

## 2013-08-03 DIAGNOSIS — H00013 Hordeolum externum right eye, unspecified eyelid: Secondary | ICD-10-CM

## 2013-08-03 DIAGNOSIS — F3289 Other specified depressive episodes: Secondary | ICD-10-CM

## 2013-08-03 DIAGNOSIS — I10 Essential (primary) hypertension: Secondary | ICD-10-CM

## 2013-08-03 NOTE — Progress Notes (Signed)
Patient ID: Heather Zimmerman. Circle, female   DOB: [birth year], 67 y.o.   MRN: [id number] Location:  [facility] Provider:  Gwenith Spitz. Renato Gails, D.O., C.M.D.  Code Status:  Full code   Chief Complaint  Patient presents with  . Acute Visit    right eye redness, drainage, swelling of lid since yesterday    HPI:   67 yo female long term care resident with h/o DMII with neuropathy, anxiety, depression, vitamin D deficiency, folate deficiency was seen for acute visit today due to redness, drainage and swelling of her right eye for the past day.  She noted some discomfort of the eye and drainage the day before.   Review of Systems:  Review of Systems  Constitutional: Negative for fever and chills.  HENT: Negative for congestion.   Eyes: Positive for discharge and redness. Negative for blurred vision, double vision, photophobia and pain.  Respiratory: Negative for shortness of breath.   Cardiovascular: Negative for chest pain.  Gastrointestinal: Negative for heartburn.  Genitourinary: Negative for dysuria.  Musculoskeletal: Negative for falls.  Skin: Negative for rash.  Neurological: Negative for dizziness.    Medications: Patient's Medications  New Prescriptions   No medications on file  Previous Medications   ACETAMINOPHEN (TYLENOL EXTRA STRENGTH PO)    Take 1,000 mg by mouth. 2 TAB EVERY 6 HOURS AS NEEDED   AMITRIPTYLINE (ELAVIL) 10 MG TABLET    Take 10 mg by mouth at bedtime.   ASPIRIN 81 MG TABLET    Take 81 mg by mouth daily.   CHOLECALCIFEROL (VITAMIN D) 1000 UNITS TABLET    Take 1,000 Units by mouth daily.   CLONAZEPAM (KLONOPIN) 0.5 MG TABLET    Take one tablet by mouth at bedtime.   FLUTICASONE PROPIONATE (FLONASE NA)    Place into the nose.   FOLIC ACID (FOLVITE) 1 MG TABLET    Take 1 mg by mouth daily.   FUROSEMIDE (LASIX) 40 MG TABLET    Take 40 mg by mouth daily.   GABAPENTIN (NEURONTIN) 300 MG CAPSULE    Take 300 mg by mouth 2 times daily at 12 noon and 4 pm.   HYDROCORTISONE 1 % LOTION    Apply 1 application topically 2 (two) times daily as needed.   INSULIN ASPART (NOVOLOG) 100 UNIT/ML INJECTION    Inject 5 Units into the skin 3 (three) times daily before meals. 5 units daily before meals for cbg > or = to 150   LOSARTAN (COZAAR) 25 MG TABLET    Take 25 mg by mouth daily.   MAGNESIUM OXIDE (MAG-OX) 400 MG TABLET    Take 400 mg by mouth 3 (three) times daily.    MELATONIN 3 MG TABS    Take 3 mg by mouth at bedtime.   MULTIPLE VITAMINS PO    Take 1 tablet by mouth daily.   OMEPRAZOLE (PRILOSEC) 20 MG CAPSULE    Take 20 mg by mouth daily.   SERTRALINE (ZOLOFT) 50 MG TABLET    Take 50 mg by mouth daily.   TRAMADOL (ULTRAM) 50 MG TABLET    Take 50 mg by mouth every 6 (six) hours as needed for pain.  Modified Medications   No medications on file  Discontinued Medications   INSULIN GLARGINE (LANTUS) 100 UNIT/ML INJECTION    Inject 5 Units into the skin at bedtime.     Physical Exam: Filed Vitals:   08/03/13 1901  BP: 108/60  Pulse: 82  Temp: 98.1 F (36.7  C)  Resp: 20  Height: 5\' 2"  (1.575 m)  Weight: 331 lb (150.141 kg)   Physical Exam  Constitutional:  Obese black female, nad  Eyes: EOM are normal. Pupils are equal, round, and reactive to light.  Right eyelid swollen, erythematous, stye present, no drainage or matting at present  Neck: Neck supple.  Cardiovascular: Normal rate, regular rhythm, normal heart sounds and intact distal pulses.   Pulmonary/Chest: Effort normal and breath sounds normal.  Abdominal: Soft. Bowel sounds are normal.  Lymphadenopathy:    She has no cervical adenopathy.  Neurological: She is alert.  Skin: Skin is warm and dry.   Labs reviewed: 04/23/13:  Wbc 5.4, h/h 10/30.6, plts 214, Na 144, K 4.5, BUN 42, cr 1.33; flp:  Tc 165, TG 84, HDL 40, LDL 108; hba1c 5.8, vit D 39  Significant Diagnostic Results: none since routine visit  Assessment/Plan 1. Type II or unspecified type diabetes mellitus with peripheral  circulatory disorders, not stated as uncontrolled(250.70) -due tofr her labs--hba1c and bmp ordered for 2 wks  2. Stye, right -warm compresses and monitor  3. Chronic diastolic heart failure -no recent weight changes -cont weights and monitoring of edema, lung sounds -continue current medications  4. Essential hypertension, benign -bp at goal with current meds  5. Depressive disorder, not elsewhere classified -stable with zoloft, elavil, clonazepam--monitor for side effects--being managed by psych services  Family/ staff Communication: reviewed plan with pt and unit supervisor who was present for visit  Goals of care: full code  Labs/tests ordered:  Bmp, hba1c, cbc

## 2013-08-12 DIAGNOSIS — L97809 Non-pressure chronic ulcer of other part of unspecified lower leg with unspecified severity: Secondary | ICD-10-CM | POA: Diagnosis not present

## 2013-08-12 DIAGNOSIS — I872 Venous insufficiency (chronic) (peripheral): Secondary | ICD-10-CM | POA: Diagnosis not present

## 2013-08-16 DIAGNOSIS — L0291 Cutaneous abscess, unspecified: Secondary | ICD-10-CM | POA: Diagnosis not present

## 2013-08-16 DIAGNOSIS — E119 Type 2 diabetes mellitus without complications: Secondary | ICD-10-CM | POA: Diagnosis not present

## 2013-08-16 DIAGNOSIS — I10 Essential (primary) hypertension: Secondary | ICD-10-CM | POA: Diagnosis not present

## 2013-08-27 DIAGNOSIS — B351 Tinea unguium: Secondary | ICD-10-CM | POA: Diagnosis not present

## 2013-08-27 DIAGNOSIS — M79609 Pain in unspecified limb: Secondary | ICD-10-CM | POA: Diagnosis not present

## 2013-08-27 DIAGNOSIS — E119 Type 2 diabetes mellitus without complications: Secondary | ICD-10-CM | POA: Diagnosis not present

## 2013-08-30 ENCOUNTER — Non-Acute Institutional Stay (SKILLED_NURSING_FACILITY): Payer: Medicare Other | Admitting: Adult Health

## 2013-08-30 DIAGNOSIS — J301 Allergic rhinitis due to pollen: Secondary | ICD-10-CM

## 2013-08-30 DIAGNOSIS — F3289 Other specified depressive episodes: Secondary | ICD-10-CM

## 2013-08-30 DIAGNOSIS — I10 Essential (primary) hypertension: Secondary | ICD-10-CM | POA: Diagnosis not present

## 2013-08-30 DIAGNOSIS — F329 Major depressive disorder, single episode, unspecified: Secondary | ICD-10-CM

## 2013-08-30 DIAGNOSIS — E1159 Type 2 diabetes mellitus with other circulatory complications: Secondary | ICD-10-CM

## 2013-08-30 DIAGNOSIS — I5032 Chronic diastolic (congestive) heart failure: Secondary | ICD-10-CM | POA: Diagnosis not present

## 2013-08-30 DIAGNOSIS — G8929 Other chronic pain: Secondary | ICD-10-CM

## 2013-08-30 DIAGNOSIS — K2100 Gastro-esophageal reflux disease with esophagitis, without bleeding: Secondary | ICD-10-CM

## 2013-09-02 ENCOUNTER — Encounter: Payer: Self-pay | Admitting: Adult Health

## 2013-09-02 NOTE — Progress Notes (Signed)
Patient ID: Heather Zimmerman. Heather Zimmerman, female   DOB: 1946-07-11, 67 y.o.   MRN: [id number]     [facility]  Allergies  Allergen Reactions  . Ace Inhibitors      Chief Complaint  Patient presents with  . Medical Managment of Chronic Issues    HPI:  She is being seen for the management of her chronic illnesses. She is complaining of increased shortness of breath present. She is waking up in the middle of the night with shortness of breath. She tells me that she used to use a cpap at night but did not like it so she stopped. She is interested in trying once again; and is willing to have a sleep study performed at the facility. There are no concerns being voiced by the nursing staff at this time.     Past Medical History  Diagnosis Date  . 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 Essential hypertension, benign 12/31/2012  . Chronic diastolic heart failure 12/31/2012  . GERD 12/31/2012  . Unspecified vitamin D deficiency 12/31/2012  . Chronic pain 12/31/2012  . Venous stasis ulcers   . Venous insufficiency (chronic) (peripheral)     Past Surgical History  Procedure Laterality Date  . Leg amputation below knee Right 01/03/2012    VITAL SIGNS BP 128/64  Pulse 90  Ht 5\' 2"  (1.575 m)  Wt 336 lb (152.409 kg)  BMI 61.44 kg/m2   Patient's Medications  New Prescriptions   No medications on file  Previous Medications   ACETAMINOPHEN (TYLENOL EXTRA STRENGTH PO)    Take 1,000 mg by mouth. 2 TAB EVERY 6 HOURS AS NEEDED   AMITRIPTYLINE (ELAVIL) 10 MG TABLET    Take 10 mg by mouth at bedtime.   ASPIRIN 81 MG TABLET    Take 81 mg by mouth daily.   CHOLECALCIFEROL (VITAMIN D) 1000 UNITS TABLET    Take 1,000 Units by mouth daily.   CLONAZEPAM (KLONOPIN) 0.5 MG TABLET    Take one tablet by mouth at bedtime.   FLUTICASONE PROPIONATE (FLONASE NA)    Place into the nose.   FOLIC ACID (FOLVITE) 1 MG TABLET     Take 1 mg by mouth daily.   FUROSEMIDE (LASIX) 40 MG TABLET    Take 40 mg by mouth daily.   GABAPENTIN (NEURONTIN) 300 MG CAPSULE    Take 300 mg by mouth 2 times daily at 12 noon and 4 pm.   HYDROCORTISONE 1 % LOTION    Apply 1 application topically 2 (two) times daily as needed.   INSULIN ASPART (NOVOLOG) 100 UNIT/ML INJECTION    Inject 5 Units into the skin 3 (three) times daily before meals. 5 units daily before meals for cbg > or = to 150   INSULIN GLARGINE (LANTUS) 100 UNIT/ML INJECTION    Inject 5 Units into the skin at bedtime.    LOSARTAN (COZAAR) 25 MG TABLET    Take 25 mg by mouth daily.   MAGNESIUM OXIDE (MAG-OX) 400 MG TABLET    Take 400 mg by mouth 3 (three) times daily.    MELATONIN 3 MG TABS    Take 3 mg by mouth at bedtime.   MULTIPLE VITAMINS PO    Take 1 tablet by mouth daily.   OMEPRAZOLE (PRILOSEC) 20 MG CAPSULE    Take 20 mg by mouth daily.   SERTRALINE (ZOLOFT) 50 MG TABLET    Take 50 mg by  mouth daily.   TRAMADOL (ULTRAM) 50 MG TABLET    Take 50 mg by mouth every 6 (six) hours as needed for pain.  Modified Medications   No medications on file  Discontinued Medications   No medications on file    SIGNIFICANT DIAGNOSTIC EXAMS  LABS REVIEWED:   11-26-12: wound culture (left leg) mrsa: doxycyline  04-23-13: wbc 5.4; hgb 10.0; hct 30.6; mcv 82.0; plt 214; glucose 89; bun 42; creat 1.33; k+4.5; na++144; liver normal albumin 3.0; chol 165; ldl 108; trig 84; hgb a1c 5.8; vit d 39     Review of Systems  Constitutional: Negative.   Respiratory: Negative for cough, HAS SHORTNESS OF BREATH   Cardiovascular: Positive for leg swelling IS chronic and without change. Negative for chest pain and palpitations.  Gastrointestinal: Negative for heartburn, nausea and abdominal pain.  Musculoskeletal: Negative for myalgias and joint pain.  Skin:       Has chronic ulceration on left lower leg  Neurological: Negative for headaches.  Psychiatric/Behavioral: Negative for depression.  The patient is not nervous/anxious and does not have insomnia.       Physical Exam  Constitutional: She is oriented to person, place, and time. She appears well-developed and well-nourished.  HENT:  Head: Normocephalic.  Neck: Neck supple.  Cardiovascular: Normal rate, regular rhythm and normal heart sounds.   Pulmonary/Chest: Effort normal and breath sounds normal.  Abdominal: Soft. Bowel sounds are normal.  Musculoskeletal: Normal range of motion.  Right bka  Neurological: She is alert and oriented to person, place, and time.  Skin: Skin is warm and dry.  Left lower leg venous stasis ulcer: no signs of infection present; dressing intact Psychiatric: She has a normal mood and affect.     ASSESSMENT/ PLAN:  Type II or unspecified type diabetes mellitus with peripheral circulatory disorders, not stated as uncontrolled(250.70) She is stable is taking lantus 5 units daily and novolog 5 units prior to meals for cbg >=150 will not make changes and will monitor her status   Essential hypertension, benign Is stable will continue cozaar 25 mg daily and asa 81 mg daily   Allergic rhinitis due to pollen Will continue flonase daily   GERD Will continue prilosec daily   Disorders of magnesium metabolism Will continue mag ox 400 mg three times daily   Depressive disorder, not elsewhere classified She is presently emotionally stable with zoloft 50 mg daily and klonopin 0.5 mg nightly for anxiety will not make changes will monitor her status   Chronic pain Her pain is being adequately managed with neurontin 300 mg twice daily amitriptyline 10 mg nightly has tylenol 1 gm every 6 hours as needed for pain and ultram 50 mg every 6 hours as needed for pain.   Chronic diastolic heart failure She is presently will continue lasix 40 mg daily and will monitor her status   morbid obesity I will look into a getting her a sleep study performed at the facility to look at obstructive sleep apnea  and will continue to monitor her status.

## 2013-09-06 ENCOUNTER — Encounter: Payer: Self-pay | Admitting: Internal Medicine

## 2013-09-07 DIAGNOSIS — G4710 Hypersomnia, unspecified: Secondary | ICD-10-CM | POA: Diagnosis not present

## 2013-09-08 DIAGNOSIS — G4710 Hypersomnia, unspecified: Secondary | ICD-10-CM | POA: Diagnosis not present

## 2013-09-16 ENCOUNTER — Encounter (HOSPITAL_BASED_OUTPATIENT_CLINIC_OR_DEPARTMENT_OTHER): Payer: Medicare Other | Attending: Internal Medicine

## 2013-09-16 DIAGNOSIS — L97809 Non-pressure chronic ulcer of other part of unspecified lower leg with unspecified severity: Secondary | ICD-10-CM | POA: Diagnosis not present

## 2013-09-16 DIAGNOSIS — F329 Major depressive disorder, single episode, unspecified: Secondary | ICD-10-CM | POA: Diagnosis not present

## 2013-09-16 DIAGNOSIS — I872 Venous insufficiency (chronic) (peripheral): Secondary | ICD-10-CM | POA: Diagnosis not present

## 2013-09-16 DIAGNOSIS — G4700 Insomnia, unspecified: Secondary | ICD-10-CM | POA: Diagnosis not present

## 2013-09-16 DIAGNOSIS — F411 Generalized anxiety disorder: Secondary | ICD-10-CM | POA: Diagnosis not present

## 2013-09-29 DIAGNOSIS — F329 Major depressive disorder, single episode, unspecified: Secondary | ICD-10-CM | POA: Diagnosis not present

## 2013-09-29 DIAGNOSIS — G4700 Insomnia, unspecified: Secondary | ICD-10-CM | POA: Diagnosis not present

## 2013-09-29 DIAGNOSIS — F411 Generalized anxiety disorder: Secondary | ICD-10-CM | POA: Diagnosis not present

## 2013-10-06 ENCOUNTER — Encounter: Payer: Self-pay | Admitting: Internal Medicine

## 2013-10-06 ENCOUNTER — Non-Acute Institutional Stay (SKILLED_NURSING_FACILITY): Payer: Medicare Other | Admitting: Internal Medicine

## 2013-10-06 DIAGNOSIS — I10 Essential (primary) hypertension: Secondary | ICD-10-CM | POA: Diagnosis not present

## 2013-10-06 DIAGNOSIS — G8929 Other chronic pain: Secondary | ICD-10-CM

## 2013-10-06 DIAGNOSIS — E1159 Type 2 diabetes mellitus with other circulatory complications: Secondary | ICD-10-CM

## 2013-10-06 DIAGNOSIS — J301 Allergic rhinitis due to pollen: Secondary | ICD-10-CM

## 2013-10-06 DIAGNOSIS — F329 Major depressive disorder, single episode, unspecified: Secondary | ICD-10-CM

## 2013-10-06 NOTE — Progress Notes (Signed)
Patient ID: Heather Zimmerman. Bel[place], female   DOB: 04/30/1946, 67 y.o.   MRN: [id number]    Heather Zimmerman living [employer]  Chief Complaint  Patient presents with  . Medical Managment of Chronic Issues   Allergies  Allergen Reactions  . Ace Inhibitors    Code status- full code  HPI 67 y/o female pt seen for routine visit. She is lying in bed, morbidly obese and in no distress. Denies any complaints. Getting skin care for her left leg. No falls reported. No other skin concerns. Pt seen with nursing supervisor today. cbg reviewed 140-280  Review of Systems  Constitutional: Negative for fever, chills, weight loss, malaise/fatigue and diaphoresis.  HENT: Negative for congestion, hearing loss and sore throat.   Eyes: Negative for blurred vision, double vision and discharge.  Respiratory: Negative for cough, sputum production, shortness of breath and wheezing.   Cardiovascular: Negative for chest pain, palpitations, orthopnea. Has chronic leg swelling.  Gastrointestinal: Negative for heartburn, nausea, vomiting, abdominal pain, diarrhea and constipation.  Genitourinary: Negative for dysuria, urgency, frequency and flank pain.  Musculoskeletal: Negative for back pain, falls, joint pain and myalgias.  Skin: Negative for itching and rash.  Neurological:  Negative for dizziness, tingling, focal weakness and headaches.  Psychiatric/Behavioral: Negative for depression and memory loss. The patient is not nervous/anxious.    Past Medical History  Diagnosis Date  . 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 Essential hypertension, benign 12/31/2012  . Chronic diastolic heart failure 12/31/2012  . GERD 12/31/2012  . Unspecified vitamin D deficiency 12/31/2012  . Chronic pain 12/31/2012  . Venous stasis ulcers   . Venous insufficiency (chronic) (peripheral)    Medication reviewed. See MAR  Physical Exam  BP 114/60   Pulse 70  Temp(Src) 97.7 F (36.5 C)  Resp 16  Ht 5\' 2"  (1.575 m)  Wt 334 lb (151.501 kg)  BMI 61.07 kg/m2  SpO2 94%  Constitutional: She is oriented to person, place, and time. She is morbidly obese  HENT:   Head: Normocephalic.  Neck: Neck supple.  Cardiovascular: Normal rate, regular rhythm and normal heart sounds.   Pulmonary/Chest: Effort normal and breath sounds normal.  Abdominal: Soft. Bowel sounds are normal.  Musculoskeletal: Normal range of motion.  Right below knee amputation Neurological: She is alert and oriented to person, place, and time.  Skin: Skin is warm and dry.  Left lower leg venous stasis ulcer with clean and dry dressing  Psychiatric: She has a normal mood and affect.   Labs- 11-26-12: wound culture (left leg) mrsa: doxycyline   04-23-13: wbc 5.4; hgb 10.0; hct 30.6; mcv 82.0; plt 214; glucose 89; bun 42; creat 1.33; k+4.5; na++144; liver normal albumin 3.0; chol 165; ldl 108; trig 84; hgb a1c 5.8; vit d 39  08-16-13: wbc 6, hb 10.4, hct 32.8, plt 203, na 141, k 5, bun 34, cr 1.26, a1c 6.6  Assessment/plan  Type II diabetes mellitus with peripheral circulatory disorders-a1c s/o controlled dm. Reviewed cbg reading. Continue SSI and recheck a1c in 3 months. If a1c < 6.5, will stop novolog. Check urine microalbumin   Chronic leg ulcer- continue aquacel dressing and pressure ulcer prophylaxis  Essential hypertension, benign- continue cozaar 25 mg daily, lasix 40 mg daily and asa 81 mg daily. Reviewed bmp  Allergic rhinitis- bettewr controlled. Change flonase to prn for now   Anxiety- continue clonazepam, stable  GERD- continue prilosec daily   Depression- emotionally stable with  zoloft 50 mg daily and klonopin 0.5 mg daily, monitor clinically  Chronic pain- controlled with gabapentin and tramadol, monitor

## 2013-10-15 DIAGNOSIS — IMO0001 Reserved for inherently not codable concepts without codable children: Secondary | ICD-10-CM | POA: Diagnosis not present

## 2013-10-15 DIAGNOSIS — S86909A Unspecified injury of unspecified muscle(s) and tendon(s) at lower leg level, unspecified leg, initial encounter: Secondary | ICD-10-CM | POA: Diagnosis not present

## 2013-10-15 DIAGNOSIS — R293 Abnormal posture: Secondary | ICD-10-CM | POA: Diagnosis not present

## 2013-10-15 DIAGNOSIS — M6281 Muscle weakness (generalized): Secondary | ICD-10-CM | POA: Diagnosis not present

## 2013-10-16 DIAGNOSIS — IMO0001 Reserved for inherently not codable concepts without codable children: Secondary | ICD-10-CM | POA: Diagnosis not present

## 2013-10-16 DIAGNOSIS — M6281 Muscle weakness (generalized): Secondary | ICD-10-CM | POA: Diagnosis not present

## 2013-10-16 DIAGNOSIS — R293 Abnormal posture: Secondary | ICD-10-CM | POA: Diagnosis not present

## 2013-10-16 DIAGNOSIS — S81809A Unspecified open wound, unspecified lower leg, initial encounter: Secondary | ICD-10-CM | POA: Diagnosis not present

## 2013-10-18 DIAGNOSIS — S86909A Unspecified injury of unspecified muscle(s) and tendon(s) at lower leg level, unspecified leg, initial encounter: Secondary | ICD-10-CM | POA: Diagnosis not present

## 2013-10-18 DIAGNOSIS — R293 Abnormal posture: Secondary | ICD-10-CM | POA: Diagnosis not present

## 2013-10-18 DIAGNOSIS — M6281 Muscle weakness (generalized): Secondary | ICD-10-CM | POA: Diagnosis not present

## 2013-10-18 DIAGNOSIS — IMO0001 Reserved for inherently not codable concepts without codable children: Secondary | ICD-10-CM | POA: Diagnosis not present

## 2013-10-19 DIAGNOSIS — IMO0001 Reserved for inherently not codable concepts without codable children: Secondary | ICD-10-CM | POA: Diagnosis not present

## 2013-10-19 DIAGNOSIS — J811 Chronic pulmonary edema: Secondary | ICD-10-CM | POA: Diagnosis not present

## 2013-10-19 DIAGNOSIS — M6281 Muscle weakness (generalized): Secondary | ICD-10-CM | POA: Diagnosis not present

## 2013-10-19 DIAGNOSIS — I517 Cardiomegaly: Secondary | ICD-10-CM | POA: Diagnosis not present

## 2013-10-19 DIAGNOSIS — S81809A Unspecified open wound, unspecified lower leg, initial encounter: Secondary | ICD-10-CM | POA: Diagnosis not present

## 2013-10-19 DIAGNOSIS — R293 Abnormal posture: Secondary | ICD-10-CM | POA: Diagnosis not present

## 2013-10-20 DIAGNOSIS — S81809A Unspecified open wound, unspecified lower leg, initial encounter: Secondary | ICD-10-CM | POA: Diagnosis not present

## 2013-10-20 DIAGNOSIS — M6281 Muscle weakness (generalized): Secondary | ICD-10-CM | POA: Diagnosis not present

## 2013-10-20 DIAGNOSIS — R293 Abnormal posture: Secondary | ICD-10-CM | POA: Diagnosis not present

## 2013-10-20 DIAGNOSIS — I5030 Unspecified diastolic (congestive) heart failure: Secondary | ICD-10-CM | POA: Diagnosis not present

## 2013-10-20 DIAGNOSIS — IMO0001 Reserved for inherently not codable concepts without codable children: Secondary | ICD-10-CM | POA: Diagnosis not present

## 2013-10-21 ENCOUNTER — Encounter (HOSPITAL_BASED_OUTPATIENT_CLINIC_OR_DEPARTMENT_OTHER): Payer: Medicare Other | Attending: Internal Medicine

## 2013-10-21 DIAGNOSIS — R293 Abnormal posture: Secondary | ICD-10-CM | POA: Diagnosis not present

## 2013-10-21 DIAGNOSIS — L97909 Non-pressure chronic ulcer of unspecified part of unspecified lower leg with unspecified severity: Principal | ICD-10-CM | POA: Insufficient documentation

## 2013-10-21 DIAGNOSIS — S81809A Unspecified open wound, unspecified lower leg, initial encounter: Secondary | ICD-10-CM | POA: Diagnosis not present

## 2013-10-21 DIAGNOSIS — M6281 Muscle weakness (generalized): Secondary | ICD-10-CM | POA: Diagnosis not present

## 2013-10-21 DIAGNOSIS — I87319 Chronic venous hypertension (idiopathic) with ulcer of unspecified lower extremity: Secondary | ICD-10-CM | POA: Diagnosis not present

## 2013-10-21 DIAGNOSIS — IMO0001 Reserved for inherently not codable concepts without codable children: Secondary | ICD-10-CM | POA: Diagnosis not present

## 2013-10-22 ENCOUNTER — Encounter: Payer: Self-pay | Admitting: Internal Medicine

## 2013-10-22 ENCOUNTER — Non-Acute Institutional Stay (SKILLED_NURSING_FACILITY): Payer: Medicare Other | Admitting: Internal Medicine

## 2013-10-22 DIAGNOSIS — I10 Essential (primary) hypertension: Secondary | ICD-10-CM | POA: Diagnosis not present

## 2013-10-22 DIAGNOSIS — S81809A Unspecified open wound, unspecified lower leg, initial encounter: Secondary | ICD-10-CM | POA: Diagnosis not present

## 2013-10-22 DIAGNOSIS — F3289 Other specified depressive episodes: Secondary | ICD-10-CM

## 2013-10-22 DIAGNOSIS — F329 Major depressive disorder, single episode, unspecified: Secondary | ICD-10-CM

## 2013-10-22 DIAGNOSIS — E1159 Type 2 diabetes mellitus with other circulatory complications: Secondary | ICD-10-CM | POA: Diagnosis not present

## 2013-10-22 DIAGNOSIS — R293 Abnormal posture: Secondary | ICD-10-CM | POA: Diagnosis not present

## 2013-10-22 DIAGNOSIS — M6281 Muscle weakness (generalized): Secondary | ICD-10-CM | POA: Diagnosis not present

## 2013-10-22 DIAGNOSIS — IMO0001 Reserved for inherently not codable concepts without codable children: Secondary | ICD-10-CM | POA: Diagnosis not present

## 2013-10-22 DIAGNOSIS — I5033 Acute on chronic diastolic (congestive) heart failure: Secondary | ICD-10-CM

## 2013-10-22 DIAGNOSIS — I509 Heart failure, unspecified: Secondary | ICD-10-CM

## 2013-10-22 DIAGNOSIS — I5030 Unspecified diastolic (congestive) heart failure: Secondary | ICD-10-CM | POA: Diagnosis not present

## 2013-10-22 NOTE — Progress Notes (Signed)
Patient ID: Heather Zimmerman. Watanabe, female   DOB: [birth year], 68 y.o.   MRN: [id number]     [name] living [facility]   Chief Complaint  Patient presents with  . Acute Visit    weight gain, hyperkalemia, dyspnea   Allergies  Allergen Reactions  . Ace Inhibitors    Code status- full code  HPI 68 y/o female patient is seen today for acute visit. She has had 8 lbs weight gain since end of December when seen for her routine visit. She is non compliant with her diet and fluid restriction. She is also having dyspnea and was given additional dose of lasix x 1 after her cxr showed vascular congestion. Reviewed her labs from 2 days back and today and her lab has worsening renal function and elevated potassium level. Has some cough  Review of Systems  Constitutional: Negative for fever, chills and diaphoresis.  HENT: positive for congestion. Negative for hearing loss and sore throat.   Eyes: Negative for blurred vision, double vision and discharge.  Respiratory: Negative for sputum production and wheezing.   Cardiovascular: Negative for chest pain, palpitations, orthopnea. Has chronic leg swelling.  Gastrointestinal: Negative for heartburn, nausea, vomiting, abdominal pain, diarrhea and constipation.  Genitourinary: Negative for dysuria, urgency, frequency and flank pain.  Musculoskeletal: Negative for back pain, falls, joint pain and myalgias.  Skin: Negative for itching and rash.  Neurological:  Negative for dizziness, tingling, focal weakness and headaches.  Psychiatric/Behavioral: Negative for depression and memory loss. The patient is not nervous/anxious.      Past Medical History  Diagnosis Date  . 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 Essential hypertension, benign 12/31/2012  . Chronic diastolic heart failure 7/61/6073  . GERD 12/31/2012  . Unspecified vitamin D deficiency 12/31/2012  . Chronic  pain 12/31/2012  . Venous stasis ulcers   . Venous insufficiency (chronic) (peripheral)     Current Outpatient Prescriptions on File Prior to Visit  Medication Sig Dispense Refill  . Acetaminophen (TYLENOL EXTRA STRENGTH PO) Take 1,000 mg by mouth. 2 TAB EVERY 6 HOURS AS NEEDED      . aspirin 81 MG tablet Take 81 mg by mouth daily.      . cholecalciferol (VITAMIN D) 1000 UNITS tablet Take 2,000 Units by mouth daily.       . clonazePAM (KLONOPIN) 0.5 MG tablet Take one tablet by mouth at bedtime.  30 tablet  5  . Fluticasone Propionate (FLONASE NA) Place into the nose.      . folic acid (FOLVITE) 1 MG tablet Take 1 mg by mouth daily.      . furosemide (LASIX) 40 MG tablet Take 40 mg by mouth daily.      Marland Kitchen gabapentin (NEURONTIN) 300 MG capsule Take 300 mg by mouth 2 times daily at 12 noon and 4 pm.      . hydrocortisone 1 % lotion Apply 1 application topically 2 (two) times daily as needed.      . insulin aspart (NOVOLOG) 100 UNIT/ML injection Inject 5 Units into the skin 3 (three) times daily before meals. 5 units daily before meals for cbg > or = to 150      . losartan (COZAAR) 25 MG tablet Take 25 mg by mouth daily.      . magnesium oxide (MAG-OX) 400 MG tablet Take 400 mg by mouth 3 (three) times daily.       . Melatonin 3 MG  TABS Take 3 mg by mouth at bedtime.      . MULTIPLE VITAMINS PO Take 1 tablet by mouth daily.      Marland Kitchen omeprazole (PRILOSEC) 20 MG capsule Take 20 mg by mouth daily.      . sertraline (ZOLOFT) 50 MG tablet Take 50 mg by mouth daily.      . traMADol (ULTRAM) 50 MG tablet Take 50 mg by mouth every 6 (six) hours as needed for pain.       No current facility-administered medications on file prior to visit.    Physical exam BP 114/58  Pulse 86  Temp(Src) 97.5 F (36.4 C)  Resp 18  Ht 5\' 2"  (1.575 m)  Wt 342 lb (155.13 kg)  BMI 62.54 kg/m2  SpO2 95%  Constitutional: She is oriented to person, place, and time. She is morbidly obese   HENT:   Head: Normocephalic.    Neck: Neck supple.  mild JVD noted Cardiovascular: Normal rate, regular rhythm and normal heart sounds.    Pulmonary/Chest: Effort normal and breath sounds decreased at lung bases with basilar crackles. No rhonchi or wheeze Abdominal: Soft. Bowel sounds are normal.  Musculoskeletal: Normal range of motion.  Right below knee amputation. Left leg has dressing in place. Edema noted Neurological: She is alert and oriented to person, place, and time.   Skin: Skin is warm and dry.  Left lower leg venous stasis ulcer with clean and dry dressing  Psychiatric: She has a normal mood and affect.    Labs- 11-26-12: wound culture (left leg) mrsa: doxycyline   04-23-13: wbc 5.4; hgb 10.0; hct 30.6; mcv 82.0; plt 214; glucose 89; bun 42; creat 1.33; k+4.5; na++144; liver normal albumin 3.0; chol 165; ldl 108; trig 84; hgb a1c 5.8; vit d 39   08-16-13: wbc 6, hb 10.4, hct 32.8, plt 203, na 141, k 5, bun 34, cr 1.26, a1c 6.6 10/20/13 na 139, k 5.8, glu 177, bun 54, cr 1.91, ca 8, BNP 19.6 10/19/13 cxr- mild pulmonary vascular congestion 10/22/13 na 143, k 5.5, glu 158, bun 48, cr 1.31, ca 8.8, BNP 12.9  Assessment/plan  Acute CHF exacerbation- with dyspnea, weight gain , cxr finding will treat her pulmonary edema. Will d/c lasix given impaired renal function. Will have her on torsemide 20 mg daily and add metolazone 5 mg daily to help her diurese. Monitor daily weight. with normal BNP and abnormal renal function, will monitor her renal function closely. Recheck bmp in  3 days. Restrict fluid intake to 1.5 litre a day and will have her on no added salt diet.will have her on duonebs q8h for 5 days to help with her dyspnea. Continue cozaar for now. 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 Reviewed cbg reading. cbg [id number]. Continue SSI and recheck a1c in 3 months. If a1c < 6.5, will stop novolog.   Essential  hypertension, benign- continue cozaar 25 mg daily, monitor renal function  Depression- emotionally stable with zoloft 50 mg daily and klonopin 0.5 mg daily, monitor clinically  Labs- bmp

## 2013-10-25 DIAGNOSIS — I10 Essential (primary) hypertension: Secondary | ICD-10-CM | POA: Diagnosis not present

## 2013-10-25 DIAGNOSIS — IMO0001 Reserved for inherently not codable concepts without codable children: Secondary | ICD-10-CM | POA: Diagnosis not present

## 2013-10-25 DIAGNOSIS — R293 Abnormal posture: Secondary | ICD-10-CM | POA: Diagnosis not present

## 2013-10-25 DIAGNOSIS — S81809A Unspecified open wound, unspecified lower leg, initial encounter: Secondary | ICD-10-CM | POA: Diagnosis not present

## 2013-10-25 DIAGNOSIS — M6281 Muscle weakness (generalized): Secondary | ICD-10-CM | POA: Diagnosis not present

## 2013-10-26 ENCOUNTER — Non-Acute Institutional Stay (SKILLED_NURSING_FACILITY): Payer: Medicare Other | Admitting: Internal Medicine

## 2013-10-26 DIAGNOSIS — IMO0001 Reserved for inherently not codable concepts without codable children: Secondary | ICD-10-CM | POA: Diagnosis not present

## 2013-10-26 DIAGNOSIS — I10 Essential (primary) hypertension: Secondary | ICD-10-CM | POA: Diagnosis not present

## 2013-10-26 DIAGNOSIS — I5032 Chronic diastolic (congestive) heart failure: Secondary | ICD-10-CM

## 2013-10-26 DIAGNOSIS — M6281 Muscle weakness (generalized): Secondary | ICD-10-CM | POA: Diagnosis not present

## 2013-10-26 DIAGNOSIS — S81809A Unspecified open wound, unspecified lower leg, initial encounter: Secondary | ICD-10-CM | POA: Diagnosis not present

## 2013-10-26 DIAGNOSIS — R293 Abnormal posture: Secondary | ICD-10-CM | POA: Diagnosis not present

## 2013-10-26 NOTE — Progress Notes (Signed)
Patient ID: Heather Zimmerman. Seelig, female   DOB: 10-23-1945, 68 y.o.   MRN: [id number]  Location:  [facility] Provider:  Rexene Edison. Mariea Clonts, D.O., C.M.D.  Code Status:  Full code  Chief Complaint  Patient presents with  . Acute Visit    lab review    HPI:  68 yo black female with h/o morbid obesity, PVD, OSA, DMII, diastolic chf seen for f/u on her labs that were repeated due to increases in her diuretics.  They show a slight increase in her creatinine but her BUN remains the same.  She has some chronic hypercarbia with her OSA.    Review of Systems:  Review of Systems  Constitutional: Negative for fever and malaise/fatigue.  Respiratory: Negative for shortness of breath.   Cardiovascular: Positive for leg swelling. Negative for chest pain.  Gastrointestinal: Negative for constipation.  Genitourinary: Negative for dysuria.  Musculoskeletal: Negative for falls.  Skin: Positive for itching.  Neurological: Negative for dizziness.    Medications: Patient's Medications  New Prescriptions   No medications on file  Previous Medications   ACETAMINOPHEN (TYLENOL EXTRA STRENGTH PO)    Take 1,000 mg by mouth. 2 TAB EVERY 6 HOURS AS NEEDED   ALUMINUM & MAGNESIUM HYDROXIDE-SIMETHICONE (MYLANTA) 500-450-40 MG/5ML SUSPENSION    Take 30 mLs by mouth every 4 (four) hours as needed for indigestion.   ASPIRIN 81 MG TABLET    Take 81 mg by mouth daily.   CHOLECALCIFEROL (VITAMIN D) 1000 UNITS TABLET    Take 2,000 Units by mouth daily.    CLONAZEPAM (KLONOPIN) 0.5 MG TABLET    Take one tablet by mouth at bedtime.   FLUTICASONE PROPIONATE (FLONASE NA)    Place into the nose.   FOLIC ACID (FOLVITE) 1 MG TABLET    Take 1 mg by mouth daily.   FUROSEMIDE (LASIX) 40 MG TABLET    Take 40 mg by mouth daily.   GABAPENTIN (NEURONTIN) 300 MG CAPSULE    Take 300 mg by mouth 2 times daily at 12 noon and 4 pm.   HYDROCORTISONE 1 % LOTION    Apply 1 application topically 2 (two) times daily as needed.     INSULIN ASPART (NOVOLOG) 100 UNIT/ML INJECTION    Inject 5 Units into the skin 3 (three) times daily before meals. 5 units daily before meals for cbg > or = to 150   LOSARTAN (COZAAR) 25 MG TABLET    Take 25 mg by mouth daily.   MAGNESIUM OXIDE (MAG-OX) 400 MG TABLET    Take 400 mg by mouth 3 (three) times daily.    MELATONIN 3 MG TABS    Take 3 mg by mouth at bedtime.   MULTIPLE VITAMINS PO    Take 1 tablet by mouth daily.   OMEPRAZOLE (PRILOSEC) 20 MG CAPSULE    Take 20 mg by mouth daily.   SERTRALINE (ZOLOFT) 50 MG TABLET    Take 50 mg by mouth daily.   TRAMADOL (ULTRAM) 50 MG TABLET    Take 50 mg by mouth every 6 (six) hours as needed for pain.  Modified Medications   No medications on file  Discontinued Medications   No medications on file    Physical Exam: 1/9:  334 lbs, 1/10:  335 lbs, 1/11: 328lbs There were no vitals filed for this visit. Physical Exam  Constitutional: She is oriented to person, place, and time. No distress.  Morbidly obese black female seated in wheelchair  Cardiovascular: Normal rate,  regular rhythm and normal heart sounds.   Pulmonary/Chest: Effort normal and breath sounds normal. She has no wheezes. She has no rales.  Abdominal: Soft. Bowel sounds are normal. She exhibits no distension and no mass. There is no tenderness.  Musculoskeletal: Normal range of motion.  Neurological: She is alert and oriented to person, place, and time.  Skin: Skin is warm and dry.  Psychiatric: She has a normal mood and affect.     Labs reviewed: 10/25/13:  Na 143, K 5.1, BUN 48, cr 1.43 (cr 1.31 on 10/22/13)  Assessment/Plan 1. Chronic diastolic heart failure -weight has improved with increased diuretic and K wnl -cont current dose and monitor -cont 1500 cc fluid restriction with concho nas diet  2. Essential hypertension, benign bp at goal with current meds  3. Morbid obesity Is inactive, stays in wheelchair, does participate in the activities, but not much  exercise Cont diabetic diet with nas   Family/ staff Communication: seen with unit supervisor Goals of care: long term care resident, full code  Labs/tests ordered:  Has hba1c for 2/15

## 2013-10-27 DIAGNOSIS — IMO0001 Reserved for inherently not codable concepts without codable children: Secondary | ICD-10-CM | POA: Diagnosis not present

## 2013-10-27 DIAGNOSIS — S86909A Unspecified injury of unspecified muscle(s) and tendon(s) at lower leg level, unspecified leg, initial encounter: Secondary | ICD-10-CM | POA: Diagnosis not present

## 2013-10-27 DIAGNOSIS — R293 Abnormal posture: Secondary | ICD-10-CM | POA: Diagnosis not present

## 2013-10-27 DIAGNOSIS — M6281 Muscle weakness (generalized): Secondary | ICD-10-CM | POA: Diagnosis not present

## 2013-10-28 DIAGNOSIS — IMO0001 Reserved for inherently not codable concepts without codable children: Secondary | ICD-10-CM | POA: Diagnosis not present

## 2013-10-28 DIAGNOSIS — R293 Abnormal posture: Secondary | ICD-10-CM | POA: Diagnosis not present

## 2013-10-28 DIAGNOSIS — S81809A Unspecified open wound, unspecified lower leg, initial encounter: Secondary | ICD-10-CM | POA: Diagnosis not present

## 2013-10-28 DIAGNOSIS — M6281 Muscle weakness (generalized): Secondary | ICD-10-CM | POA: Diagnosis not present

## 2013-10-29 DIAGNOSIS — S86909A Unspecified injury of unspecified muscle(s) and tendon(s) at lower leg level, unspecified leg, initial encounter: Secondary | ICD-10-CM | POA: Diagnosis not present

## 2013-10-29 DIAGNOSIS — M6281 Muscle weakness (generalized): Secondary | ICD-10-CM | POA: Diagnosis not present

## 2013-10-29 DIAGNOSIS — R293 Abnormal posture: Secondary | ICD-10-CM | POA: Diagnosis not present

## 2013-10-29 DIAGNOSIS — IMO0001 Reserved for inherently not codable concepts without codable children: Secondary | ICD-10-CM | POA: Diagnosis not present

## 2013-11-01 DIAGNOSIS — R293 Abnormal posture: Secondary | ICD-10-CM | POA: Diagnosis not present

## 2013-11-01 DIAGNOSIS — S81809A Unspecified open wound, unspecified lower leg, initial encounter: Secondary | ICD-10-CM | POA: Diagnosis not present

## 2013-11-01 DIAGNOSIS — M6281 Muscle weakness (generalized): Secondary | ICD-10-CM | POA: Diagnosis not present

## 2013-11-01 DIAGNOSIS — IMO0001 Reserved for inherently not codable concepts without codable children: Secondary | ICD-10-CM | POA: Diagnosis not present

## 2013-11-02 DIAGNOSIS — IMO0001 Reserved for inherently not codable concepts without codable children: Secondary | ICD-10-CM | POA: Diagnosis not present

## 2013-11-02 DIAGNOSIS — S81809A Unspecified open wound, unspecified lower leg, initial encounter: Secondary | ICD-10-CM | POA: Diagnosis not present

## 2013-11-02 DIAGNOSIS — M6281 Muscle weakness (generalized): Secondary | ICD-10-CM | POA: Diagnosis not present

## 2013-11-02 DIAGNOSIS — R293 Abnormal posture: Secondary | ICD-10-CM | POA: Diagnosis not present

## 2013-11-04 DIAGNOSIS — R293 Abnormal posture: Secondary | ICD-10-CM | POA: Diagnosis not present

## 2013-11-04 DIAGNOSIS — IMO0001 Reserved for inherently not codable concepts without codable children: Secondary | ICD-10-CM | POA: Diagnosis not present

## 2013-11-04 DIAGNOSIS — M6281 Muscle weakness (generalized): Secondary | ICD-10-CM | POA: Diagnosis not present

## 2013-11-04 DIAGNOSIS — S81809A Unspecified open wound, unspecified lower leg, initial encounter: Secondary | ICD-10-CM | POA: Diagnosis not present

## 2013-11-05 DIAGNOSIS — M6281 Muscle weakness (generalized): Secondary | ICD-10-CM | POA: Diagnosis not present

## 2013-11-05 DIAGNOSIS — S81809A Unspecified open wound, unspecified lower leg, initial encounter: Secondary | ICD-10-CM | POA: Diagnosis not present

## 2013-11-05 DIAGNOSIS — R293 Abnormal posture: Secondary | ICD-10-CM | POA: Diagnosis not present

## 2013-11-05 DIAGNOSIS — IMO0001 Reserved for inherently not codable concepts without codable children: Secondary | ICD-10-CM | POA: Diagnosis not present

## 2013-11-08 DIAGNOSIS — IMO0001 Reserved for inherently not codable concepts without codable children: Secondary | ICD-10-CM | POA: Diagnosis not present

## 2013-11-08 DIAGNOSIS — R293 Abnormal posture: Secondary | ICD-10-CM | POA: Diagnosis not present

## 2013-11-08 DIAGNOSIS — M6281 Muscle weakness (generalized): Secondary | ICD-10-CM | POA: Diagnosis not present

## 2013-11-08 DIAGNOSIS — S81809A Unspecified open wound, unspecified lower leg, initial encounter: Secondary | ICD-10-CM | POA: Diagnosis not present

## 2013-11-09 DIAGNOSIS — M6281 Muscle weakness (generalized): Secondary | ICD-10-CM | POA: Diagnosis not present

## 2013-11-09 DIAGNOSIS — R293 Abnormal posture: Secondary | ICD-10-CM | POA: Diagnosis not present

## 2013-11-09 DIAGNOSIS — S86909A Unspecified injury of unspecified muscle(s) and tendon(s) at lower leg level, unspecified leg, initial encounter: Secondary | ICD-10-CM | POA: Diagnosis not present

## 2013-11-09 DIAGNOSIS — N390 Urinary tract infection, site not specified: Secondary | ICD-10-CM | POA: Diagnosis not present

## 2013-11-09 DIAGNOSIS — IMO0001 Reserved for inherently not codable concepts without codable children: Secondary | ICD-10-CM | POA: Diagnosis not present

## 2013-11-10 DIAGNOSIS — IMO0001 Reserved for inherently not codable concepts without codable children: Secondary | ICD-10-CM | POA: Diagnosis not present

## 2013-11-10 DIAGNOSIS — M6281 Muscle weakness (generalized): Secondary | ICD-10-CM | POA: Diagnosis not present

## 2013-11-10 DIAGNOSIS — S86909A Unspecified injury of unspecified muscle(s) and tendon(s) at lower leg level, unspecified leg, initial encounter: Secondary | ICD-10-CM | POA: Diagnosis not present

## 2013-11-10 DIAGNOSIS — R293 Abnormal posture: Secondary | ICD-10-CM | POA: Diagnosis not present

## 2013-11-11 DIAGNOSIS — M6281 Muscle weakness (generalized): Secondary | ICD-10-CM | POA: Diagnosis not present

## 2013-11-11 DIAGNOSIS — N390 Urinary tract infection, site not specified: Secondary | ICD-10-CM | POA: Diagnosis not present

## 2013-11-11 DIAGNOSIS — IMO0001 Reserved for inherently not codable concepts without codable children: Secondary | ICD-10-CM | POA: Diagnosis not present

## 2013-11-11 DIAGNOSIS — R293 Abnormal posture: Secondary | ICD-10-CM | POA: Diagnosis not present

## 2013-11-11 DIAGNOSIS — S81809A Unspecified open wound, unspecified lower leg, initial encounter: Secondary | ICD-10-CM | POA: Diagnosis not present

## 2013-11-12 ENCOUNTER — Other Ambulatory Visit: Payer: Self-pay | Admitting: *Deleted

## 2013-11-12 DIAGNOSIS — R293 Abnormal posture: Secondary | ICD-10-CM | POA: Diagnosis not present

## 2013-11-12 DIAGNOSIS — S86909A Unspecified injury of unspecified muscle(s) and tendon(s) at lower leg level, unspecified leg, initial encounter: Secondary | ICD-10-CM | POA: Diagnosis not present

## 2013-11-12 DIAGNOSIS — IMO0001 Reserved for inherently not codable concepts without codable children: Secondary | ICD-10-CM | POA: Diagnosis not present

## 2013-11-12 DIAGNOSIS — M6281 Muscle weakness (generalized): Secondary | ICD-10-CM | POA: Diagnosis not present

## 2013-11-12 MED ORDER — CLONAZEPAM 0.5 MG PO TABS: ORAL_TABLET | ORAL | Status: DC

## 2013-11-12 NOTE — Telephone Encounter (Signed)
[facility] [place] 

## 2013-11-15 DIAGNOSIS — R293 Abnormal posture: Secondary | ICD-10-CM | POA: Diagnosis not present

## 2013-11-15 DIAGNOSIS — IMO0001 Reserved for inherently not codable concepts without codable children: Secondary | ICD-10-CM | POA: Diagnosis not present

## 2013-11-15 DIAGNOSIS — S81809A Unspecified open wound, unspecified lower leg, initial encounter: Secondary | ICD-10-CM | POA: Diagnosis not present

## 2013-11-15 DIAGNOSIS — M6281 Muscle weakness (generalized): Secondary | ICD-10-CM | POA: Diagnosis not present

## 2013-11-16 ENCOUNTER — Non-Acute Institutional Stay (SKILLED_NURSING_FACILITY): Payer: Medicare Other | Admitting: Internal Medicine

## 2013-11-16 DIAGNOSIS — B952 Enterococcus as the cause of diseases classified elsewhere: Secondary | ICD-10-CM

## 2013-11-16 DIAGNOSIS — N390 Urinary tract infection, site not specified: Secondary | ICD-10-CM

## 2013-11-16 DIAGNOSIS — IMO0001 Reserved for inherently not codable concepts without codable children: Secondary | ICD-10-CM | POA: Diagnosis not present

## 2013-11-16 DIAGNOSIS — S86909A Unspecified injury of unspecified muscle(s) and tendon(s) at lower leg level, unspecified leg, initial encounter: Secondary | ICD-10-CM | POA: Diagnosis not present

## 2013-11-16 DIAGNOSIS — R21 Rash and other nonspecific skin eruption: Secondary | ICD-10-CM

## 2013-11-16 DIAGNOSIS — M6281 Muscle weakness (generalized): Secondary | ICD-10-CM | POA: Diagnosis not present

## 2013-11-16 DIAGNOSIS — R293 Abnormal posture: Secondary | ICD-10-CM | POA: Diagnosis not present

## 2013-11-16 NOTE — Progress Notes (Signed)
Patient ID: Heather Demo. Stracener, female   DOB: 09-Apr-1946, 68 y.o.   MRN: [id number]  Location:  [facility] Provider:  Rexene Edison. Mariea Clonts, D.O., C.M.D.  Chief Complaint  Patient presents with  . Acute Visit    itching, also has UTI    HPI:  68 yo black female long term care resident with chronic difficulty with itching especially of her upper arms and torso region.    Her UA was positive with culture growing enterococcus >100K.  She has been started on ampicillin 500mg  po bid for 7 days.  Her itching is not new related to the abx.  She does have evidence of excoriation of her shoulders.  Review of Systems:  Review of Systems  Constitutional: Negative for fever and malaise/fatigue.  Respiratory: Positive for shortness of breath.   Cardiovascular: Negative for chest pain.  Gastrointestinal: Negative for abdominal pain.  Genitourinary: Negative for dysuria.  Musculoskeletal: Negative for falls and myalgias.  Skin: Positive for itching and rash.  Neurological: Negative for dizziness.  Psychiatric/Behavioral: Positive for memory loss. The patient is nervous/anxious.     Medications: Patient's Medications  New Prescriptions   No medications on file  Previous Medications   ACETAMINOPHEN (TYLENOL EXTRA STRENGTH PO)    Take 1,000 mg by mouth. 2 TAB EVERY 6 HOURS AS NEEDED   ALUMINUM & MAGNESIUM HYDROXIDE-SIMETHICONE (MYLANTA) 500-450-40 MG/5ML SUSPENSION    Take 30 mLs by mouth every 4 (four) hours as needed for indigestion.   ASPIRIN 81 MG TABLET    Take 81 mg by mouth daily.   CHOLECALCIFEROL (VITAMIN D) 1000 UNITS TABLET    Take 2,000 Units by mouth daily.    CLONAZEPAM (KLONOPIN) 0.5 MG TABLET    Take one tablet by mouth at bedtime.   FLUTICASONE PROPIONATE (FLONASE NA)    Place into the nose.   FOLIC ACID (FOLVITE) 1 MG TABLET    Take 1 mg by mouth daily.   FUROSEMIDE (LASIX) 40 MG TABLET    Take 40 mg by mouth daily.   GABAPENTIN (NEURONTIN) 300 MG CAPSULE    Take 300  mg by mouth 2 times daily at 12 noon and 4 pm.   HYDROCORTISONE 1 % LOTION    Apply 1 application topically 2 (two) times daily as needed.   INSULIN ASPART (NOVOLOG) 100 UNIT/ML INJECTION    Inject 5 Units into the skin 3 (three) times daily before meals. 5 units daily before meals for cbg > or = to 150   LOSARTAN (COZAAR) 25 MG TABLET    Take 25 mg by mouth daily.   MAGNESIUM OXIDE (MAG-OX) 400 MG TABLET    Take 400 mg by mouth 3 (three) times daily.    MELATONIN 3 MG TABS    Take 3 mg by mouth at bedtime.   MULTIPLE VITAMINS PO    Take 1 tablet by mouth daily.   OMEPRAZOLE (PRILOSEC) 20 MG CAPSULE    Take 20 mg by mouth daily.   SERTRALINE (ZOLOFT) 50 MG TABLET    Take 50 mg by mouth daily.   TRAMADOL (ULTRAM) 50 MG TABLET    Take 50 mg by mouth every 6 (six) hours as needed for pain.  Modified Medications   No medications on file  Discontinued Medications   No medications on file    Physical Exam: Filed Vitals:   11/16/13 1224  BP: 102/58  Pulse: 84  Temp: 97 F (36.1 C)  Resp: 20  Height: 5\' 2"  (  1.575 m)  Weight: 328 lb (148.78 kg)  SpO2: 92%   Physical Exam  Constitutional:  Morbidly obese black female, nonadherent with cpap and diet  Cardiovascular: Normal rate, regular rhythm and normal heart sounds.   Pulmonary/Chest: Effort normal and breath sounds normal.  Abdominal: Soft. Bowel sounds are normal. She exhibits no distension and no mass. There is no tenderness.  Musculoskeletal: Normal range of motion. She exhibits no edema.  Neurological: She is alert.  Skin:  Vesicular rash of shoulders bilaterally and upper torso  Psychiatric: She has a normal mood and affect.     Labs reviewed: ua c+s positive for enterococcus  Assessment/Plan 1. Rash, skin -suspect contact dermatitis--will treat with low dose steroid cream until resolution  2. Enterococcus UTI -cont ampicillin as prescribed -encourage water intake for the week  3. Morbid obesity -she is noncompliant  with diet and fluid restrictions, cpap, sodium intake, etc.   -not very active either, so unlikely to lose weight despite encouragement   Family/ staff Communication: seen with unit supervisor Goals of care: long term resident  Labs/tests ordered:  None today

## 2013-11-17 DIAGNOSIS — M6281 Muscle weakness (generalized): Secondary | ICD-10-CM | POA: Diagnosis not present

## 2013-11-17 DIAGNOSIS — R293 Abnormal posture: Secondary | ICD-10-CM | POA: Diagnosis not present

## 2013-11-17 DIAGNOSIS — E119 Type 2 diabetes mellitus without complications: Secondary | ICD-10-CM | POA: Diagnosis not present

## 2013-11-17 DIAGNOSIS — IMO0001 Reserved for inherently not codable concepts without codable children: Secondary | ICD-10-CM | POA: Diagnosis not present

## 2013-11-17 DIAGNOSIS — S81809A Unspecified open wound, unspecified lower leg, initial encounter: Secondary | ICD-10-CM | POA: Diagnosis not present

## 2013-11-18 ENCOUNTER — Encounter (HOSPITAL_BASED_OUTPATIENT_CLINIC_OR_DEPARTMENT_OTHER): Payer: Medicare Other | Attending: Internal Medicine

## 2013-11-18 DIAGNOSIS — I87339 Chronic venous hypertension (idiopathic) with ulcer and inflammation of unspecified lower extremity: Secondary | ICD-10-CM | POA: Diagnosis not present

## 2013-11-18 DIAGNOSIS — E1142 Type 2 diabetes mellitus with diabetic polyneuropathy: Secondary | ICD-10-CM | POA: Diagnosis not present

## 2013-11-18 DIAGNOSIS — L97909 Non-pressure chronic ulcer of unspecified part of unspecified lower leg with unspecified severity: Principal | ICD-10-CM

## 2013-11-18 DIAGNOSIS — E669 Obesity, unspecified: Secondary | ICD-10-CM | POA: Insufficient documentation

## 2013-11-18 DIAGNOSIS — I872 Venous insufficiency (chronic) (peripheral): Secondary | ICD-10-CM | POA: Insufficient documentation

## 2013-11-18 DIAGNOSIS — M79609 Pain in unspecified limb: Secondary | ICD-10-CM | POA: Diagnosis not present

## 2013-11-18 DIAGNOSIS — I509 Heart failure, unspecified: Secondary | ICD-10-CM | POA: Insufficient documentation

## 2013-11-18 DIAGNOSIS — S86909A Unspecified injury of unspecified muscle(s) and tendon(s) at lower leg level, unspecified leg, initial encounter: Secondary | ICD-10-CM | POA: Diagnosis not present

## 2013-11-18 DIAGNOSIS — I739 Peripheral vascular disease, unspecified: Secondary | ICD-10-CM | POA: Insufficient documentation

## 2013-11-18 DIAGNOSIS — M6281 Muscle weakness (generalized): Secondary | ICD-10-CM | POA: Diagnosis not present

## 2013-11-18 DIAGNOSIS — IMO0001 Reserved for inherently not codable concepts without codable children: Secondary | ICD-10-CM | POA: Diagnosis not present

## 2013-11-18 DIAGNOSIS — E1149 Type 2 diabetes mellitus with other diabetic neurological complication: Secondary | ICD-10-CM | POA: Diagnosis not present

## 2013-11-18 DIAGNOSIS — R293 Abnormal posture: Secondary | ICD-10-CM | POA: Diagnosis not present

## 2013-11-18 DIAGNOSIS — B351 Tinea unguium: Secondary | ICD-10-CM | POA: Diagnosis not present

## 2013-11-26 ENCOUNTER — Non-Acute Institutional Stay (SKILLED_NURSING_FACILITY): Payer: Medicare Other | Admitting: Internal Medicine

## 2013-11-26 DIAGNOSIS — I10 Essential (primary) hypertension: Secondary | ICD-10-CM

## 2013-11-26 DIAGNOSIS — E1159 Type 2 diabetes mellitus with other circulatory complications: Secondary | ICD-10-CM

## 2013-11-26 NOTE — Progress Notes (Signed)
Patient ID: Kaislyn Gulas. Jollie, female   DOB: [birth year], 67 y.o.   MRN: [id number]     [name] living [facility]  Chief Complaint  Patient presents with  . Acute Visit    hyperglycemia   HPI 68 y/o female pt has been having elevated blood sugar reading. She is on SSI at present. cbg 160-290 She is at her baseline. No new confusion. Denies abdominal pain, nausea or vomiting.  Remains afebrile Reviewed labs No dyspnea or chest pain  ROS- see hpi  reviewed med list and PMH  Physical exam BP 140/60  Pulse 86  Temp(Src) 97.8 F (36.6 C)  Resp 18  Wt 319 lb (144.697 kg)  SpO2 92%  Constitutional: She is oriented to person, place, and time. She is morbidly obese   HENT:   Head: Normocephalic.   Neck: Neck supple.   Cardiovascular: Normal rate, regular rhythm and normal heart sounds. trace edema Pulmonary/Chest: Effort normal and breath sounds normal.   Abdominal: Soft. Bowel sounds are normal.  Musculoskeletal: Normal range of motion.  Right below knee amputation Neurological: She is alert and oriented to person, place, and time.   Skin: Skin is warm and dry.  Left lower leg venous stasis ulcer with clean and dry dressing  Psychiatric: She has a normal mood and affect.   Labs- 11-26-12: wound culture (left leg) mrsa: doxycyline   04-23-13: wbc 5.4; hgb 10.0; hct 30.6; mcv 82.0; plt 214; glucose 89; bun 42; creat 1.33; k+4.5; na++144; liver normal albumin 3.0; chol 165; ldl 108; trig 84; hgb a1c 5.8; vit d 39   08-16-13: wbc 6, hb 10.4, hct 32.8, plt 203, na 141, k 5, bun 34, cr 1.26, a1c 6.6 11-17-13 a1c 7.9  Assessment/plan  Type 2 dm- Worsened with elevated cbg.  Start lantus 5 u sq qhs Continue novolog SSI Recheck a1c, bmp 3 months  HTN- Continue cozaar and lasix with baby aspirin  Blanchie Serve, MD  [facility] [phone] (Monday-Friday 8 am - 5 pm) [phone] (afterhours)

## 2013-12-12 DIAGNOSIS — R21 Rash and other nonspecific skin eruption: Secondary | ICD-10-CM | POA: Diagnosis not present

## 2013-12-12 DIAGNOSIS — G4700 Insomnia, unspecified: Secondary | ICD-10-CM | POA: Diagnosis not present

## 2013-12-12 DIAGNOSIS — F329 Major depressive disorder, single episode, unspecified: Secondary | ICD-10-CM | POA: Diagnosis not present

## 2013-12-12 DIAGNOSIS — F411 Generalized anxiety disorder: Secondary | ICD-10-CM | POA: Diagnosis not present

## 2013-12-12 DIAGNOSIS — S86909A Unspecified injury of unspecified muscle(s) and tendon(s) at lower leg level, unspecified leg, initial encounter: Secondary | ICD-10-CM | POA: Diagnosis not present

## 2013-12-12 DIAGNOSIS — L282 Other prurigo: Secondary | ICD-10-CM | POA: Diagnosis not present

## 2013-12-12 DIAGNOSIS — F3289 Other specified depressive episodes: Secondary | ICD-10-CM | POA: Diagnosis not present

## 2013-12-14 ENCOUNTER — Non-Acute Institutional Stay (SKILLED_NURSING_FACILITY): Payer: Medicare Other | Admitting: Internal Medicine

## 2013-12-14 DIAGNOSIS — L282 Other prurigo: Secondary | ICD-10-CM

## 2013-12-14 DIAGNOSIS — F329 Major depressive disorder, single episode, unspecified: Secondary | ICD-10-CM | POA: Diagnosis not present

## 2013-12-14 DIAGNOSIS — F411 Generalized anxiety disorder: Secondary | ICD-10-CM | POA: Diagnosis not present

## 2013-12-14 DIAGNOSIS — F3289 Other specified depressive episodes: Secondary | ICD-10-CM | POA: Diagnosis not present

## 2013-12-14 DIAGNOSIS — G4700 Insomnia, unspecified: Secondary | ICD-10-CM | POA: Diagnosis not present

## 2013-12-14 NOTE — Progress Notes (Signed)
Patient ID: Heather Zimmerman, female   DOB: 06-Jan-1946, 68 y.o.   MRN: [id number]  Location: [facility] Provider:  Rexene Edison. Mariea Clonts, D.O., C.M.D.  Code Status:  Full code  Chief Complaint  Patient presents with  . Acute Visit    persistent itching, has worsened and spread to her back    HPI:  68 yo morbidly obese black female seen for acute visit due to persistent itching.  During the first visit for this, no rash could be identified--only stretch marks and excoriation where she had itched.  Only a small amt can be seen now, also.  There have been no changes to detergents, soaps, creams, clothing, etc.  She is a long term care resident here.    Review of Systems:  Review of Systems  Constitutional: Negative for fever, chills and weight loss.  HENT: Negative for congestion.   Eyes: Negative for blurred vision.  Respiratory: Positive for shortness of breath.   Cardiovascular: Positive for leg swelling. Negative for chest pain.  Gastrointestinal: Negative for abdominal pain.  Genitourinary: Negative for dysuria.  Musculoskeletal: Negative for falls and myalgias.  Skin: Positive for itching and rash.  Neurological: Positive for weakness. Negative for dizziness, loss of consciousness and headaches.  Endo/Heme/Allergies: Does not bruise/bleed easily.    Medications: Patient's Medications  New Prescriptions   No medications on file  Previous Medications   ACETAMINOPHEN (TYLENOL EXTRA STRENGTH PO)    Take 1,000 mg by mouth. 2 TAB EVERY 6 HOURS AS NEEDED   ALUMINUM & MAGNESIUM HYDROXIDE-SIMETHICONE (MYLANTA) 500-450-40 MG/5ML SUSPENSION    Take 30 mLs by mouth every 4 (four) hours as needed for indigestion.   ASPIRIN 81 MG TABLET    Take 81 mg by mouth daily.   CHOLECALCIFEROL (VITAMIN D) 1000 UNITS TABLET    Take 2,000 Units by mouth daily.    CLONAZEPAM (KLONOPIN) 0.5 MG TABLET    Take one tablet by mouth at bedtime.   FLUTICASONE PROPIONATE (FLONASE NA)    Place into  the nose.   FOLIC ACID (FOLVITE) 1 MG TABLET    Take 1 mg by mouth daily.   FUROSEMIDE (LASIX) 40 MG TABLET    Take 40 mg by mouth daily.   GABAPENTIN (NEURONTIN) 300 MG CAPSULE    Take 300 mg by mouth 2 times daily at 12 noon and 4 pm.   HYDROCORTISONE 1 % LOTION    Apply 1 application topically 2 (two) times daily as needed.   INSULIN ASPART (NOVOLOG) 100 UNIT/ML INJECTION    Inject 5 Units into the skin 3 (three) times daily before meals. 5 units daily before meals for cbg > or = to 150   LOSARTAN (COZAAR) 25 MG TABLET    Take 25 mg by mouth daily.   MAGNESIUM OXIDE (MAG-OX) 400 MG TABLET    Take 400 mg by mouth 3 (three) times daily.    MELATONIN 3 MG TABS    Take 3 mg by mouth at bedtime.   MULTIPLE VITAMINS PO    Take 1 tablet by mouth daily.   OMEPRAZOLE (PRILOSEC) 20 MG CAPSULE    Take 20 mg by mouth daily.   SERTRALINE (ZOLOFT) 50 MG TABLET    Take 50 mg by mouth daily.   TRAMADOL (ULTRAM) 50 MG TABLET    Take 50 mg by mouth every 6 (six) hours as needed for pain.  Modified Medications   No medications on file  Discontinued Medications   No medications  on file    Physical Exam: There were no vitals filed for this visit. Physical Exam  Constitutional: She is oriented to person, place, and time. No distress.  Cardiovascular: Normal rate, regular rhythm and normal heart sounds.   Pulmonary/Chest: Effort normal and breath sounds normal. She has no rales.  Abdominal: Soft. Bowel sounds are normal. She exhibits no distension. There is no tenderness.  Neurological: She is alert and oriented to person, place, and time.  Skin: Skin is warm and dry.  Several dried papulovesicular areas on left shoulder, but distributed irregularly over the shoulder and back area not in dermatomal fashion, primarily still has evidence of excoriation and stretch marks  Psychiatric:  Anxious      Assessment/Plan 1. Pruritic rash -etiology remains unclear--does not appear to be shingles to me at this  time and if she did have it, it happened some time between visits.  She had shingles one time in the distant past and that could cause her itching long term which should respond to gabapentin, lyrica or lidoderm patch for postherpetic neuralgia -will treat affected area right now with low dose steroid cream for the itching--if still remains, will change to lyrica or add lidoderm patch -seems she will not benefit from shingles treatment at this time as she's been having these symptoms for several weeks to months.  Family/ staff Communication: seen with unit supervisor and discussed plan with her and pt

## 2013-12-26 ENCOUNTER — Encounter: Payer: Self-pay | Admitting: Internal Medicine

## 2014-01-05 DIAGNOSIS — R21 Rash and other nonspecific skin eruption: Secondary | ICD-10-CM | POA: Diagnosis not present

## 2014-01-21 DIAGNOSIS — E119 Type 2 diabetes mellitus without complications: Secondary | ICD-10-CM | POA: Diagnosis not present

## 2014-01-21 DIAGNOSIS — I10 Essential (primary) hypertension: Secondary | ICD-10-CM | POA: Diagnosis not present

## 2014-01-24 DIAGNOSIS — I10 Essential (primary) hypertension: Secondary | ICD-10-CM | POA: Diagnosis not present

## 2014-01-26 DIAGNOSIS — R21 Rash and other nonspecific skin eruption: Secondary | ICD-10-CM | POA: Diagnosis not present

## 2014-02-11 ENCOUNTER — Non-Acute Institutional Stay (SKILLED_NURSING_FACILITY): Payer: Medicare Other | Admitting: Internal Medicine

## 2014-02-11 DIAGNOSIS — F3289 Other specified depressive episodes: Secondary | ICD-10-CM | POA: Diagnosis not present

## 2014-02-11 DIAGNOSIS — E662 Morbid (severe) obesity with alveolar hypoventilation: Secondary | ICD-10-CM | POA: Diagnosis present

## 2014-02-11 DIAGNOSIS — E876 Hypokalemia: Secondary | ICD-10-CM | POA: Diagnosis present

## 2014-02-11 DIAGNOSIS — J810 Acute pulmonary edema: Secondary | ICD-10-CM | POA: Diagnosis not present

## 2014-02-11 DIAGNOSIS — G4733 Obstructive sleep apnea (adult) (pediatric): Secondary | ICD-10-CM | POA: Diagnosis present

## 2014-02-11 DIAGNOSIS — N390 Urinary tract infection, site not specified: Secondary | ICD-10-CM | POA: Diagnosis present

## 2014-02-11 DIAGNOSIS — G8929 Other chronic pain: Secondary | ICD-10-CM | POA: Diagnosis present

## 2014-02-11 DIAGNOSIS — L299 Pruritus, unspecified: Secondary | ICD-10-CM

## 2014-02-11 DIAGNOSIS — A4902 Methicillin resistant Staphylococcus aureus infection, unspecified site: Secondary | ICD-10-CM | POA: Diagnosis present

## 2014-02-11 DIAGNOSIS — M79609 Pain in unspecified limb: Secondary | ICD-10-CM | POA: Diagnosis not present

## 2014-02-11 DIAGNOSIS — E119 Type 2 diabetes mellitus without complications: Secondary | ICD-10-CM | POA: Diagnosis not present

## 2014-02-11 DIAGNOSIS — G4700 Insomnia, unspecified: Secondary | ICD-10-CM | POA: Diagnosis not present

## 2014-02-11 DIAGNOSIS — E559 Vitamin D deficiency, unspecified: Secondary | ICD-10-CM | POA: Diagnosis not present

## 2014-02-11 DIAGNOSIS — I5033 Acute on chronic diastolic (congestive) heart failure: Secondary | ICD-10-CM | POA: Diagnosis not present

## 2014-02-11 DIAGNOSIS — H40019 Open angle with borderline findings, low risk, unspecified eye: Secondary | ICD-10-CM | POA: Diagnosis not present

## 2014-02-11 DIAGNOSIS — I509 Heart failure, unspecified: Secondary | ICD-10-CM | POA: Diagnosis present

## 2014-02-11 DIAGNOSIS — I739 Peripheral vascular disease, unspecified: Secondary | ICD-10-CM | POA: Diagnosis not present

## 2014-02-11 DIAGNOSIS — L259 Unspecified contact dermatitis, unspecified cause: Secondary | ICD-10-CM | POA: Diagnosis not present

## 2014-02-11 DIAGNOSIS — I798 Other disorders of arteries, arterioles and capillaries in diseases classified elsewhere: Secondary | ICD-10-CM | POA: Diagnosis present

## 2014-02-11 DIAGNOSIS — E872 Acidosis, unspecified: Secondary | ICD-10-CM | POA: Diagnosis present

## 2014-02-11 DIAGNOSIS — S88119A Complete traumatic amputation at level between knee and ankle, unspecified lower leg, initial encounter: Secondary | ICD-10-CM | POA: Diagnosis not present

## 2014-02-11 DIAGNOSIS — Z794 Long term (current) use of insulin: Secondary | ICD-10-CM | POA: Diagnosis not present

## 2014-02-11 DIAGNOSIS — L97809 Non-pressure chronic ulcer of other part of unspecified lower leg with unspecified severity: Secondary | ICD-10-CM | POA: Diagnosis not present

## 2014-02-11 DIAGNOSIS — E1142 Type 2 diabetes mellitus with diabetic polyneuropathy: Secondary | ICD-10-CM | POA: Diagnosis not present

## 2014-02-11 DIAGNOSIS — K219 Gastro-esophageal reflux disease without esophagitis: Secondary | ICD-10-CM | POA: Diagnosis present

## 2014-02-11 DIAGNOSIS — Z91199 Patient's noncompliance with other medical treatment and regimen due to unspecified reason: Secondary | ICD-10-CM | POA: Diagnosis not present

## 2014-02-11 DIAGNOSIS — J9600 Acute respiratory failure, unspecified whether with hypoxia or hypercapnia: Secondary | ICD-10-CM | POA: Diagnosis not present

## 2014-02-11 DIAGNOSIS — H2510 Age-related nuclear cataract, unspecified eye: Secondary | ICD-10-CM | POA: Diagnosis not present

## 2014-02-11 DIAGNOSIS — Z23 Encounter for immunization: Secondary | ICD-10-CM | POA: Diagnosis not present

## 2014-02-11 DIAGNOSIS — I129 Hypertensive chronic kidney disease with stage 1 through stage 4 chronic kidney disease, or unspecified chronic kidney disease: Secondary | ICD-10-CM | POA: Diagnosis present

## 2014-02-11 DIAGNOSIS — I10 Essential (primary) hypertension: Secondary | ICD-10-CM | POA: Diagnosis not present

## 2014-02-11 DIAGNOSIS — R509 Fever, unspecified: Secondary | ICD-10-CM | POA: Diagnosis not present

## 2014-02-11 DIAGNOSIS — R0902 Hypoxemia: Secondary | ICD-10-CM | POA: Diagnosis not present

## 2014-02-11 DIAGNOSIS — R6510 Systemic inflammatory response syndrome (SIRS) of non-infectious origin without acute organ dysfunction: Secondary | ICD-10-CM | POA: Diagnosis not present

## 2014-02-11 DIAGNOSIS — F411 Generalized anxiety disorder: Secondary | ICD-10-CM | POA: Diagnosis not present

## 2014-02-11 DIAGNOSIS — E1149 Type 2 diabetes mellitus with other diabetic neurological complication: Secondary | ICD-10-CM | POA: Diagnosis not present

## 2014-02-11 DIAGNOSIS — G9341 Metabolic encephalopathy: Secondary | ICD-10-CM | POA: Diagnosis present

## 2014-02-11 DIAGNOSIS — J988 Other specified respiratory disorders: Secondary | ICD-10-CM | POA: Diagnosis not present

## 2014-02-11 DIAGNOSIS — Z515 Encounter for palliative care: Secondary | ICD-10-CM | POA: Diagnosis not present

## 2014-02-11 DIAGNOSIS — A419 Sepsis, unspecified organism: Secondary | ICD-10-CM | POA: Diagnosis not present

## 2014-02-11 DIAGNOSIS — N179 Acute kidney failure, unspecified: Secondary | ICD-10-CM | POA: Diagnosis present

## 2014-02-11 DIAGNOSIS — Z888 Allergy status to other drugs, medicaments and biological substances status: Secondary | ICD-10-CM | POA: Diagnosis not present

## 2014-02-11 DIAGNOSIS — B9562 Methicillin resistant Staphylococcus aureus infection as the cause of diseases classified elsewhere: Secondary | ICD-10-CM | POA: Diagnosis not present

## 2014-02-11 DIAGNOSIS — S81809A Unspecified open wound, unspecified lower leg, initial encounter: Secondary | ICD-10-CM | POA: Diagnosis not present

## 2014-02-11 DIAGNOSIS — K2100 Gastro-esophageal reflux disease with esophagitis, without bleeding: Secondary | ICD-10-CM | POA: Diagnosis not present

## 2014-02-11 DIAGNOSIS — IMO0002 Reserved for concepts with insufficient information to code with codable children: Secondary | ICD-10-CM | POA: Diagnosis not present

## 2014-02-11 DIAGNOSIS — J9620 Acute and chronic respiratory failure, unspecified whether with hypoxia or hypercapnia: Secondary | ICD-10-CM | POA: Diagnosis present

## 2014-02-11 DIAGNOSIS — F329 Major depressive disorder, single episode, unspecified: Secondary | ICD-10-CM | POA: Diagnosis present

## 2014-02-11 DIAGNOSIS — R0602 Shortness of breath: Secondary | ICD-10-CM | POA: Diagnosis not present

## 2014-02-11 DIAGNOSIS — Z993 Dependence on wheelchair: Secondary | ICD-10-CM | POA: Diagnosis not present

## 2014-02-11 DIAGNOSIS — E870 Hyperosmolality and hypernatremia: Secondary | ICD-10-CM | POA: Diagnosis not present

## 2014-02-11 DIAGNOSIS — R6521 Severe sepsis with septic shock: Secondary | ICD-10-CM | POA: Diagnosis not present

## 2014-02-11 DIAGNOSIS — E1159 Type 2 diabetes mellitus with other circulatory complications: Secondary | ICD-10-CM | POA: Diagnosis not present

## 2014-02-11 DIAGNOSIS — Z6841 Body Mass Index (BMI) 40.0 and over, adult: Secondary | ICD-10-CM | POA: Diagnosis not present

## 2014-02-11 DIAGNOSIS — N189 Chronic kidney disease, unspecified: Secondary | ICD-10-CM | POA: Diagnosis present

## 2014-02-11 DIAGNOSIS — Z89519 Acquired absence of unspecified leg below knee: Secondary | ICD-10-CM | POA: Diagnosis not present

## 2014-02-11 DIAGNOSIS — I5032 Chronic diastolic (congestive) heart failure: Secondary | ICD-10-CM | POA: Diagnosis not present

## 2014-02-11 DIAGNOSIS — M19079 Primary osteoarthritis, unspecified ankle and foot: Secondary | ICD-10-CM | POA: Diagnosis not present

## 2014-02-11 DIAGNOSIS — D696 Thrombocytopenia, unspecified: Secondary | ICD-10-CM | POA: Diagnosis not present

## 2014-02-11 DIAGNOSIS — B351 Tinea unguium: Secondary | ICD-10-CM | POA: Diagnosis not present

## 2014-02-11 DIAGNOSIS — Z7982 Long term (current) use of aspirin: Secondary | ICD-10-CM | POA: Diagnosis not present

## 2014-02-11 DIAGNOSIS — L089 Local infection of the skin and subcutaneous tissue, unspecified: Secondary | ICD-10-CM | POA: Diagnosis not present

## 2014-02-11 DIAGNOSIS — J9610 Chronic respiratory failure, unspecified whether with hypoxia or hypercapnia: Secondary | ICD-10-CM | POA: Diagnosis not present

## 2014-02-11 DIAGNOSIS — E669 Obesity, unspecified: Secondary | ICD-10-CM | POA: Diagnosis not present

## 2014-02-11 DIAGNOSIS — N63 Unspecified lump in unspecified breast: Secondary | ICD-10-CM | POA: Diagnosis not present

## 2014-02-11 DIAGNOSIS — Z9119 Patient's noncompliance with other medical treatment and regimen: Secondary | ICD-10-CM | POA: Diagnosis not present

## 2014-02-11 DIAGNOSIS — I872 Venous insufficiency (chronic) (peripheral): Secondary | ICD-10-CM | POA: Diagnosis not present

## 2014-02-11 NOTE — Progress Notes (Signed)
Patient ID: Heather Zimmerman. Heather Zimmerman, female   DOB: 23-Sep-1946, 68 y.o.   MRN: [id number]    Heather Zimmerman living [facility]  Chief Complaint  Patient presents with  . Acute Visit    itching   Allergies  Allergen Reactions  . Ace Inhibitors    HPI:   68 y/o female patient is seen today for acute visit due to persistent itching. she mentions that itching is present only at night time. Staff mentions not noticing any rash in skin. She denies any change in her detergents, soaps, creams, clothing, etc. this itching has been going on for some time now.  Review of Systems:  Constitutional: Negative for fever, chills and weight loss.  HENT: Negative for congestion. no difficulty swallowing Eyes: Negative for blurred vision.  Respiratory: has dyspnea Cardiovascular: Negative for chest pain.  Gastrointestinal: Negative for abdominal pain.  Genitourinary: Negative for dysuria.  Musculoskeletal: Negative for falls and myalgias.  Neurological: Negative for dizziness, loss of consciousness and headaches.  Endo/Heme/Allergies: Does not bruise/bleed easily.   Past Medical History  Diagnosis Date  . 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 Essential hypertension, benign 12/31/2012  . Chronic diastolic heart failure 5/00/9381  . GERD 12/31/2012  . Unspecified vitamin D deficiency 12/31/2012  . Chronic pain 12/31/2012  . Venous stasis ulcers   . Venous insufficiency (chronic) (peripheral)    Medication reviewed. See [facility]  Physical exam Vital signs reviewed. Stable  Constitutional: She is oriented to person, place, and time. No distress.  Cardiovascular: Normal rate, regular rhythm and normal heart sounds.   Pulmonary/Chest: Effort normal and breath sounds normal. She has no rales.  Abdominal: Soft. Bowel sounds are normal. She exhibits no distension. There is no tenderness.  Neurological: She is alert and oriented  to person, place, and time.  Skin: Skin is warm and dry. Few excoriation marks noted. No erythema or rash noted. Psychiatric: some anxiety present   Assessment/Plan  Pruritis- no rash noted. Her known history of diabetes with neuropathy could be contributing to this unexplained itching mainly at night time. Continue gabapentin for now and will add atarax 25 mg qhs for now. If no relief with this, consider increasing dose of gabapentin. Reassess if no improvement

## 2014-02-13 DIAGNOSIS — L299 Pruritus, unspecified: Secondary | ICD-10-CM | POA: Insufficient documentation

## 2014-02-17 ENCOUNTER — Encounter (HOSPITAL_BASED_OUTPATIENT_CLINIC_OR_DEPARTMENT_OTHER): Payer: Medicare Other | Attending: Internal Medicine

## 2014-02-17 DIAGNOSIS — I872 Venous insufficiency (chronic) (peripheral): Secondary | ICD-10-CM | POA: Insufficient documentation

## 2014-02-17 DIAGNOSIS — I509 Heart failure, unspecified: Secondary | ICD-10-CM | POA: Insufficient documentation

## 2014-02-17 DIAGNOSIS — I739 Peripheral vascular disease, unspecified: Secondary | ICD-10-CM | POA: Insufficient documentation

## 2014-02-17 DIAGNOSIS — L97809 Non-pressure chronic ulcer of other part of unspecified lower leg with unspecified severity: Secondary | ICD-10-CM | POA: Insufficient documentation

## 2014-02-17 DIAGNOSIS — E669 Obesity, unspecified: Secondary | ICD-10-CM | POA: Insufficient documentation

## 2014-02-17 DIAGNOSIS — E119 Type 2 diabetes mellitus without complications: Secondary | ICD-10-CM | POA: Insufficient documentation

## 2014-02-23 DIAGNOSIS — F3289 Other specified depressive episodes: Secondary | ICD-10-CM | POA: Diagnosis not present

## 2014-02-23 DIAGNOSIS — F411 Generalized anxiety disorder: Secondary | ICD-10-CM | POA: Diagnosis not present

## 2014-02-23 DIAGNOSIS — G4700 Insomnia, unspecified: Secondary | ICD-10-CM | POA: Diagnosis not present

## 2014-02-23 DIAGNOSIS — F329 Major depressive disorder, single episode, unspecified: Secondary | ICD-10-CM | POA: Diagnosis not present

## 2014-02-24 ENCOUNTER — Non-Acute Institutional Stay (SKILLED_NURSING_FACILITY): Payer: Medicare Other | Admitting: Internal Medicine

## 2014-02-24 DIAGNOSIS — I10 Essential (primary) hypertension: Secondary | ICD-10-CM

## 2014-02-24 DIAGNOSIS — E1159 Type 2 diabetes mellitus with other circulatory complications: Secondary | ICD-10-CM | POA: Diagnosis not present

## 2014-02-24 DIAGNOSIS — K2100 Gastro-esophageal reflux disease with esophagitis, without bleeding: Secondary | ICD-10-CM | POA: Diagnosis not present

## 2014-02-24 DIAGNOSIS — E119 Type 2 diabetes mellitus without complications: Secondary | ICD-10-CM | POA: Diagnosis not present

## 2014-02-24 DIAGNOSIS — I872 Venous insufficiency (chronic) (peripheral): Secondary | ICD-10-CM | POA: Diagnosis not present

## 2014-02-24 DIAGNOSIS — L299 Pruritus, unspecified: Secondary | ICD-10-CM

## 2014-02-24 DIAGNOSIS — E669 Obesity, unspecified: Secondary | ICD-10-CM | POA: Diagnosis not present

## 2014-02-24 DIAGNOSIS — L97809 Non-pressure chronic ulcer of other part of unspecified lower leg with unspecified severity: Secondary | ICD-10-CM | POA: Diagnosis not present

## 2014-02-24 NOTE — Progress Notes (Signed)
Patient ID: Heather Zimmerman. Walts, female   DOB: 1945/10/23, 68 y.o.   MRN: [id number]    [name] living [facility]  Chief Complaint  Patient presents with  . Medical Management of Chronic Issues    routine visit   Allergies  Allergen Reactions  . Ace Inhibitors    HPI:   68 y/o female patient is seen today for routine visit. She continues to have the itching mainly at night time. Reviewed bp sbp 113-158 ad DBP 60-78. cbg 142-265  Review of Systems:  Constitutional: Negative for fever, chills and weight loss.   HENT: Negative for congestion. no difficulty swallowing Eyes: Negative for blurred vision.   Respiratory: has dyspnea Cardiovascular: Negative for chest pain.   Gastrointestinal: Negative for abdominal pain.   Genitourinary: Negative for dysuria.   Musculoskeletal: Negative for falls and myalgias.   Neurological: Negative for dizziness, loss of consciousness and headaches.   Endo/Heme/Allergies: Does not bruise/bleed easily.     Past Medical History  Diagnosis Date  . 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 Essential hypertension, benign 12/31/2012  . Chronic diastolic heart failure 12/27/1759  . GERD 12/31/2012  . Unspecified vitamin D deficiency 12/31/2012  . Chronic pain 12/31/2012  . Venous stasis ulcers   . Venous insufficiency (chronic) (peripheral)    Medication reviewed. See [facility]  Physical exam BP 98/63  Pulse 76  Temp(Src) 97.1 F (36.2 C)  Resp 18  Ht 5\' 2"  (1.575 m)  Wt 325 lb (147.419 kg)  BMI 59.43 kg/m2  SpO2 92%  Constitutional: She is oriented to person, place, and time. No distress.   Cardiovascular: Normal rate, regular rhythm and normal heart sounds.    Pulmonary/Chest: Effort normal and breath sounds normal. She has no rales.   Abdominal: Soft. Bowel sounds are normal. She exhibits no distension. There is no tenderness.  Neurological: She is alert and  oriented to person, place, and time.   Skin: Skin is warm and dry. Few excoriation marks noted. No erythema or rash noted. Psychiatric: some anxiety present  Labs- 01/24/14 na 142, k 3.4, bun 42, cr 1.47 , glu 155, ca 9.2 01/21/14 a1c 7.7 08/16/14 wbc 6.0, hb 10.4, eos 0.2  Assessment/Plan  Pruritis- no rash noted. Her known history of diabetes with neuropathy could be contributing to this unexplained itching mainly at night time. will remove her mattress and check on bed bugs D/c hydroxyzine as not helping. Benadryl 25 mg 2 tab at bedtime for itching Consider repeat treatment for scabies if these changes don't help Increase lantus to 8 u sq daily given elevated cbg reading recently Check thyroid level and LFT to assess for reversible cause of pruritis  Dm type 2- reviewed a1c. Increase lantus to 8 u sq daily, monitor cbg. Continue gabapentin  HTN- well controlled reading overall with few low readings. bp on lower side of normal or normal readings. Change cozaar to half a tablet daily 12.5 mg daily and monitor bp reading. Continue lasix and aspirin  GERD- continue prilosec daily   Labs- tsh, lft, cbc with diff

## 2014-02-25 DIAGNOSIS — I10 Essential (primary) hypertension: Secondary | ICD-10-CM | POA: Diagnosis not present

## 2014-02-25 DIAGNOSIS — E559 Vitamin D deficiency, unspecified: Secondary | ICD-10-CM | POA: Diagnosis not present

## 2014-02-25 DIAGNOSIS — L299 Pruritus, unspecified: Secondary | ICD-10-CM | POA: Insufficient documentation

## 2014-02-25 LAB — CBC AND DIFFERENTIAL
HCT: 36 % (ref 36–46)
Hemoglobin: 11.3 g/dL — AB (ref 12.0–16.0)
Platelets: 192 10*3/uL (ref 150–399)
WBC: 5 10*3/mL

## 2014-02-25 LAB — HEPATIC FUNCTION PANEL
ALT: 20 U/L (ref 7–35)
AST: 20 U/L (ref 13–35)
Alkaline Phosphatase: 78 U/L (ref 25–125)
Bilirubin, Direct: 0.1 mg/dL
Bilirubin, Total: 0.5 mg/dL

## 2014-02-25 LAB — TSH: TSH: 1.73 u[IU]/mL (ref <=5.90)

## 2014-02-28 DIAGNOSIS — E119 Type 2 diabetes mellitus without complications: Secondary | ICD-10-CM | POA: Diagnosis not present

## 2014-02-28 DIAGNOSIS — M19079 Primary osteoarthritis, unspecified ankle and foot: Secondary | ICD-10-CM | POA: Diagnosis not present

## 2014-02-28 DIAGNOSIS — B351 Tinea unguium: Secondary | ICD-10-CM | POA: Diagnosis not present

## 2014-02-28 DIAGNOSIS — M79609 Pain in unspecified limb: Secondary | ICD-10-CM | POA: Diagnosis not present

## 2014-03-11 ENCOUNTER — Encounter: Payer: Self-pay | Admitting: Internal Medicine

## 2014-03-17 ENCOUNTER — Encounter: Payer: Self-pay | Admitting: Internal Medicine

## 2014-03-17 DIAGNOSIS — F411 Generalized anxiety disorder: Secondary | ICD-10-CM | POA: Diagnosis not present

## 2014-03-17 DIAGNOSIS — G4700 Insomnia, unspecified: Secondary | ICD-10-CM | POA: Diagnosis not present

## 2014-03-17 DIAGNOSIS — F329 Major depressive disorder, single episode, unspecified: Secondary | ICD-10-CM | POA: Diagnosis not present

## 2014-03-17 DIAGNOSIS — F3289 Other specified depressive episodes: Secondary | ICD-10-CM | POA: Diagnosis not present

## 2014-03-25 ENCOUNTER — Non-Acute Institutional Stay (SKILLED_NURSING_FACILITY): Payer: Medicare Other | Admitting: Internal Medicine

## 2014-03-25 DIAGNOSIS — L309 Dermatitis, unspecified: Secondary | ICD-10-CM

## 2014-03-25 DIAGNOSIS — L259 Unspecified contact dermatitis, unspecified cause: Secondary | ICD-10-CM

## 2014-03-25 DIAGNOSIS — L299 Pruritus, unspecified: Secondary | ICD-10-CM

## 2014-03-25 NOTE — Progress Notes (Signed)
Patient ID: Heather Zimmerman, female   DOB: [birth year], 68 y.o.   MRN: [id number]    Facility: [facility]  Chief Complaint  Patient presents with  . Acute Visit    breast lesion   Allergies  Allergen Reactions  . Ace Inhibitors    HPI 68 y/o female patient is seen for lesions noted on both her breast for a week now. She complaints of it itching. She denies any pain or drainage. No personal or family history of breast cancer. Of note, she has been having chronic uritcaria for few months without complete resolution.   ROS No fever or chills Generalized itching present No chest pain or new dyspnea Has morbid obesity  Past Medical History  Diagnosis Date  . 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 Essential hypertension, benign 12/31/2012  . Chronic diastolic heart failure 3/54/6568  . GERD 12/31/2012  . Unspecified vitamin D deficiency 12/31/2012  . Chronic pain 12/31/2012  . Venous stasis ulcers   . Venous insufficiency (chronic) (peripheral)    Medication reviewed. See [facility]  Physical exam BP 120/66  Pulse 72  Resp 16  Wt 323 lb (146.512 kg)  SpO2 92%  Constitutional: She is oriented to person, place, and time. No distress.   Cardiovascular: Normal rate, regular rhythm and normal heart sounds.    Pulmonary/Chest: Effort normal and breath sounds normal. She has no rales.   Abdominal: Soft. Bowel sounds are normal. She exhibits no distension. There is no tenderness.  Neurological: She is alert and oriented to person, place, and time.   Skin: Skin is warm and dry. Thickened rough skin texture in both breasts in areolar area, no drainage or skin breakdown noted, no erythema, no warmth, no palpable mass. Has similar skin thickening with rough darkened surface on her right lateral thigh area Psychiatric: calm this visit  Labs- 01/24/14 na 142, k 3.4, bun 42, cr 1.47 ,  glu 155, ca 9.2 01/21/14 a1c 7.7 08/16/14 wbc 6.0, hb 10.4, eos 0.2  Assessment/Plan  eczema Unclear cause. With her having similar skin finding at few different spots, possible eczematous area. No silver scaling making psoriasis less likely at present. On hydrocortisone cream, triamcinolone lotion and benadryl at present. Will discontinue hydrocortisone and triamcinolone cream. Will have her on prednisone 50 mg daily for 7 days and betamethasone lotion 0.055 bid for 2 weeks. Reassess in a week.  Pruritis Will change benadryl to 50 mg every 12 hours as needed for itching and reassess

## 2014-03-28 DIAGNOSIS — E119 Type 2 diabetes mellitus without complications: Secondary | ICD-10-CM | POA: Diagnosis not present

## 2014-03-28 DIAGNOSIS — H2510 Age-related nuclear cataract, unspecified eye: Secondary | ICD-10-CM | POA: Diagnosis not present

## 2014-03-28 DIAGNOSIS — H40019 Open angle with borderline findings, low risk, unspecified eye: Secondary | ICD-10-CM | POA: Diagnosis not present

## 2014-03-29 NOTE — Progress Notes (Signed)
This encounter was created in error - please disregard.

## 2014-04-04 DIAGNOSIS — G4700 Insomnia, unspecified: Secondary | ICD-10-CM | POA: Diagnosis not present

## 2014-04-04 DIAGNOSIS — F329 Major depressive disorder, single episode, unspecified: Secondary | ICD-10-CM | POA: Diagnosis not present

## 2014-04-04 DIAGNOSIS — F3289 Other specified depressive episodes: Secondary | ICD-10-CM | POA: Diagnosis not present

## 2014-04-04 DIAGNOSIS — F411 Generalized anxiety disorder: Secondary | ICD-10-CM | POA: Diagnosis not present

## 2014-04-14 ENCOUNTER — Non-Acute Institutional Stay (SKILLED_NURSING_FACILITY): Payer: Medicare Other | Admitting: Internal Medicine

## 2014-04-14 DIAGNOSIS — E1165 Type 2 diabetes mellitus with hyperglycemia: Secondary | ICD-10-CM

## 2014-04-14 DIAGNOSIS — E119 Type 2 diabetes mellitus without complications: Secondary | ICD-10-CM | POA: Diagnosis not present

## 2014-04-14 NOTE — Progress Notes (Signed)
Patient ID: Heather Zimmerman. Javed, female   DOB: May 17, 1946, 68 y.o.   MRN: [id number]    Facility: [facility]  Chief Complaint  Patient presents with  . Acute Visit    elevated blood sugar reading   Allergies  Allergen Reactions  . Ace Inhibitors    HPI 68 y/o female patient is seen for acute visit with her cbg readings being elevated. She was recently on a course of prednisone and has completed it. She has DM and is non compliant with her diet. a1c 7.7 in 01/21/14 and cbg reading shave been above 250 mostly.  Review of Systems:  Constitutional: Negative for fever, chills.   HENT: Negative for congestion. no difficulty swallowing Eyes: Negative for blurred vision.   Respiratory: no cough and SOB at rest Cardiovascular: Negative for chest pain.   Gastrointestinal: Negative for abdominal pain.   Genitourinary: Negative for dysuria.   Musculoskeletal: Negative for falls and myalgias.   Neurological: Negative for dizziness, loss of consciousness and headaches.    Past Medical History  Diagnosis Date  . 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 Essential hypertension, benign 12/31/2012  . Chronic diastolic heart failure 0/16/0109  . GERD 12/31/2012  . Unspecified vitamin D deficiency 12/31/2012  . Chronic pain 12/31/2012  . Venous stasis ulcers   . Venous insufficiency (chronic) (peripheral)    Current Outpatient Prescriptions on File Prior to Visit  Medication Sig Dispense Refill  . Acetaminophen (TYLENOL EXTRA STRENGTH PO) Take 1,000 mg by mouth. 2 TAB EVERY 6 HOURS AS NEEDED      . aluminum & magnesium hydroxide-simethicone (MYLANTA) 500-450-40 MG/5ML suspension Take 30 mLs by mouth every 4 (four) hours as needed for indigestion.      Marland Kitchen aspirin 81 MG tablet Take 81 mg by mouth daily.      . cholecalciferol (VITAMIN D) 1000 UNITS tablet Take 2,000 Units by mouth daily.         . clonazePAM (KLONOPIN) 0.5 MG tablet Take one tablet by mouth at bedtime.  30 tablet  5  . Fluticasone Propionate (FLONASE NA) Place into the nose.      . folic acid (FOLVITE) 1 MG tablet Take 1 mg by mouth daily.      . furosemide (LASIX) 40 MG tablet Take 40 mg by mouth daily.      Marland Kitchen gabapentin (NEURONTIN) 300 MG capsule Take 300 mg by mouth 2 times daily at 12 noon and 4 pm.      . hydrocortisone 1 % lotion Apply 1 application topically 2 (two) times daily as needed.      . insulin aspart (NOVOLOG) 100 UNIT/ML injection Inject 5 Units into the skin 3 (three) times daily before meals. 5 units daily before meals for cbg > or = to 150      . losartan (COZAAR) 25 MG tablet Take 25 mg by mouth daily.      . magnesium oxide (MAG-OX) 400 MG tablet Take 400 mg by mouth 3 (three) times daily.       . Melatonin 3 MG TABS Take 3 mg by mouth at bedtime.      . MULTIPLE VITAMINS PO Take 1 tablet by mouth daily.      Marland Kitchen omeprazole (PRILOSEC) 20 MG capsule Take 20 mg by mouth daily.      . sertraline (ZOLOFT) 50 MG tablet Take 50 mg by mouth daily.      Marland Kitchen  traMADol (ULTRAM) 50 MG tablet Take 50 mg by mouth every 6 (six) hours as needed for pain.       No current facility-administered medications on file prior to visit.   Physical exam VSS, afebrile. Nursing notes reviewed  Constitutional: She is oriented to person, place, and time. No distress.   Cardiovascular: Normal rate, regular rhythm and normal heart sounds.    Pulmonary/Chest: Effort normal and breath sounds normal. She has no rales.   Abdominal: Soft. Bowel sounds are normal. She exhibits no distension. There is no tenderness.  Neurological: She is alert and oriented to person, place, and time.   Skin: Skin is warm and dry.  Psychiatric: calm this visit   Labs- 01/24/14 na 142, k 3.4, bun 42, cr 1.47 , glu 155, ca 9.2 01/21/14 a1c 7.7 08/16/14 wbc 6.0, hb 10.4, eos 0.2  Assessment/Plan  Dm type 2- reviewed a1c. Increase lantus to 12 u sq  daily, monitor cbg. Continue gabapentin. Check a1c next lab draw. Dietary compliance reinforced  Labs- a1c

## 2014-04-18 DIAGNOSIS — E119 Type 2 diabetes mellitus without complications: Secondary | ICD-10-CM | POA: Diagnosis not present

## 2014-04-18 LAB — HEMOGLOBIN A1C: HEMOGLOBIN A1C: 9 % — AB (ref 4.0–6.0)

## 2014-04-19 DIAGNOSIS — E1165 Type 2 diabetes mellitus with hyperglycemia: Secondary | ICD-10-CM | POA: Insufficient documentation

## 2014-05-06 ENCOUNTER — Non-Acute Institutional Stay (SKILLED_NURSING_FACILITY): Payer: Medicare Other | Admitting: Internal Medicine

## 2014-05-06 DIAGNOSIS — E1149 Type 2 diabetes mellitus with other diabetic neurological complication: Secondary | ICD-10-CM | POA: Diagnosis not present

## 2014-05-06 DIAGNOSIS — E1142 Type 2 diabetes mellitus with diabetic polyneuropathy: Secondary | ICD-10-CM

## 2014-05-06 DIAGNOSIS — IMO0002 Reserved for concepts with insufficient information to code with codable children: Secondary | ICD-10-CM

## 2014-05-06 DIAGNOSIS — E1140 Type 2 diabetes mellitus with diabetic neuropathy, unspecified: Secondary | ICD-10-CM

## 2014-05-06 DIAGNOSIS — E1165 Type 2 diabetes mellitus with hyperglycemia: Principal | ICD-10-CM

## 2014-05-06 NOTE — Progress Notes (Signed)
Patient ID: Heather Zimmerman, female   DOB: May 02, 1946, 68 y.o.   MRN: [id number]    Facility: [facility]  Chief Complaint  Patient presents with  . Acute Visit    elevated blood sugar readings   Allergies  Allergen Reactions  . Ace Inhibitors    HPI 68 y/o female patient is seen for acute visit with her cbg readings being elevated. She has DM and is non compliant with her diet. a1c 7.7 in 01/21/14 and now is 9.0. cbg reading shave been between 160-250 with one off 337. On lantus 12 u and novolog 5 u before morning meal at present   Review of Systems:  Constitutional: Negative for fever, chills.   HENT: Negative for congestion. no difficulty swallowing Eyes: Negative for blurred vision.   Respiratory: no cough and SOB at rest Cardiovascular: Negative for chest pain.   Gastrointestinal: Negative for abdominal pain.   Genitourinary: Negative for dysuria.   Musculoskeletal: Negative for falls and myalgias.   Neurological: Negative for dizziness, loss of consciousness and headaches.    Past Medical History  Diagnosis Date  . 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 Essential hypertension, benign 12/31/2012  . Chronic diastolic heart failure 5/57/3220  . GERD 12/31/2012  . Unspecified vitamin D deficiency 12/31/2012  . Chronic pain 12/31/2012  . Venous stasis ulcers   . Venous insufficiency (chronic) (peripheral)    Medication reviewed. See [facility]  Physical exam BP 116/63  Pulse 76  Temp(Src) 97.2 F (36.2 C)  Resp 18  Ht 5\' 2"  (1.575 m)  Wt 313 lb (141.976 kg)  BMI 57.23 kg/m2  SpO2 92%  Constitutional: She is oriented to person, place, and time. No distress. obese Cardiovascular: Normal rate, regular rhythm and normal heart sounds.    Pulmonary/Chest: Effort normal and breath sounds normal. She has no rales.   Abdominal: Soft. Bowel sounds are normal. there is  no tenderness.  Neurological: She is alert and oriented to person, place, and time.   Skin: Skin is warm and dry.  Psychiatric: calm this visit   Labs- 01/24/14 na 142, k 3.4, bun 42, cr 1.47 , glu 155, ca 9.2 01/21/14 a1c 7.7 04/18/14 a1c 9.0  Assessment/Plan  Dm type 2- reviewed a1c. Increase lantus to 18 u sq daily, monitor cbg. Also will change novolog to 5 u premeal for cbg > 200 only for now. Dietary compliance reinforced. Continue aspirin 81 mg daily, cozaar 12.5 mg daily for now and reassess  Labs- a1c in 3 months

## 2014-05-16 ENCOUNTER — Encounter: Payer: Self-pay | Admitting: Internal Medicine

## 2014-05-31 DIAGNOSIS — E119 Type 2 diabetes mellitus without complications: Secondary | ICD-10-CM | POA: Diagnosis not present

## 2014-05-31 DIAGNOSIS — M79609 Pain in unspecified limb: Secondary | ICD-10-CM | POA: Diagnosis not present

## 2014-05-31 DIAGNOSIS — I739 Peripheral vascular disease, unspecified: Secondary | ICD-10-CM | POA: Diagnosis not present

## 2014-05-31 DIAGNOSIS — B351 Tinea unguium: Secondary | ICD-10-CM | POA: Diagnosis not present

## 2014-06-07 ENCOUNTER — Non-Acute Institutional Stay (SKILLED_NURSING_FACILITY): Payer: Medicare Other | Admitting: Internal Medicine

## 2014-06-07 ENCOUNTER — Encounter: Payer: Self-pay | Admitting: Internal Medicine

## 2014-06-07 ENCOUNTER — Other Ambulatory Visit: Payer: Self-pay | Admitting: *Deleted

## 2014-06-07 DIAGNOSIS — E1159 Type 2 diabetes mellitus with other circulatory complications: Secondary | ICD-10-CM | POA: Diagnosis not present

## 2014-06-07 DIAGNOSIS — L299 Pruritus, unspecified: Secondary | ICD-10-CM

## 2014-06-07 DIAGNOSIS — I10 Essential (primary) hypertension: Secondary | ICD-10-CM | POA: Diagnosis not present

## 2014-06-07 DIAGNOSIS — I5032 Chronic diastolic (congestive) heart failure: Secondary | ICD-10-CM | POA: Diagnosis not present

## 2014-06-07 DIAGNOSIS — K2100 Gastro-esophageal reflux disease with esophagitis, without bleeding: Secondary | ICD-10-CM

## 2014-06-07 MED ORDER — CLONAZEPAM 0.5 MG PO TABS: ORAL_TABLET | ORAL | Status: DC

## 2014-06-07 NOTE — Progress Notes (Signed)
Patient ID: Heather Zimmerman, female   DOB: 1946-01-15, 68 y.o.   MRN: [id number]  Location:  [facility] Provider:  Rexene Edison. Mariea Clonts, D.O., C.M.D.  Code Status:  Full  Chief Complaint  Patient presents with  . Medical Management of Chronic Issues    HPI:  68 yo morbidly obese black female with h/o OSA who will not wear cpap, chronic pruritus, diabetes, hypertension seen for her regular visit.  She continues to c/o itching of her breasts--apparently this was unilateral at one point when she was seen by my colleague and affected the areola area, but she now has scaling and itching of her areolas bilaterally.    Review of Systems:  Review of Systems  Constitutional: Negative for fever.  HENT: Negative for congestion.   Eyes: Negative for blurred vision.  Respiratory: Negative for shortness of breath.   Cardiovascular: Negative for chest pain.  Gastrointestinal: Negative for abdominal pain and constipation.  Genitourinary: Negative for dysuria.  Musculoskeletal: Negative for falls and myalgias.  Skin: Positive for itching and rash.  Neurological: Negative for dizziness and weakness.  Psychiatric/Behavioral: Positive for memory loss.    Medications: Patient's Medications  New Prescriptions   No medications on file  Previous Medications   ACETAMINOPHEN (TYLENOL EXTRA STRENGTH PO)    Take 1,000 mg by mouth. 2 TAB EVERY 6 HOURS AS NEEDED   ALUMINUM & MAGNESIUM HYDROXIDE-SIMETHICONE (MYLANTA) 500-450-40 MG/5ML SUSPENSION    Take 30 mLs by mouth every 4 (four) hours as needed for indigestion.   ASPIRIN 81 MG TABLET    Take 81 mg by mouth daily.   CHOLECALCIFEROL (VITAMIN D) 1000 UNITS TABLET    Take 2,000 Units by mouth daily.    CLONAZEPAM (KLONOPIN) 0.5 MG TABLET    Take one tablet by mouth at bedtime.   DIPHENHYDRAMINE (SOMINEX) 25 MG TABLET    Take 25 mg by mouth every 12 (twelve) hours as needed for itching or sleep.   FLUTICASONE PROPIONATE (FLONASE NA)    Place 1  spray into the nose as needed.    GABAPENTIN (NEURONTIN) 300 MG CAPSULE    Take 600 mg by mouth 2 (two) times daily.    INSULIN ASPART (NOVOLOG) 100 UNIT/ML INJECTION    Inject 5 Units into the skin 3 (three) times daily before meals. 5 units daily before meals for cbg > or = to 150   INSULIN GLARGINE (LANTUS SOLOSTAR) 100 UNIT/ML SOLOSTAR PEN    Inject 18 Units into the skin daily at 10 pm.   IPRATROPIUM-ALBUTEROL (DUONEB) 0.5-2.5 (3) MG/3ML SOLN    Take 3 mLs by nebulization every 8 (eight) hours as needed.   LOSARTAN (COZAAR) 25 MG TABLET    Take 12.5 mg by mouth daily.    MAGNESIUM OXIDE (MAG-OX) 400 MG TABLET    Take 400 mg by mouth 3 (three) times daily.    MELATONIN 3 MG TABS    Take 6 mg by mouth at bedtime.    MULTIPLE VITAMINS PO    Take 1 tablet by mouth daily.   OMEPRAZOLE (PRILOSEC) 20 MG CAPSULE    Take 20 mg by mouth daily.   POTASSIUM CHLORIDE SA (K-DUR,KLOR-CON) 20 MEQ TABLET    Take 20 mEq by mouth daily.   SERTRALINE (ZOLOFT) 50 MG TABLET    Take 50 mg by mouth daily.   TRAMADOL (ULTRAM) 50 MG TABLET    Take 50 mg by mouth every 6 (six) hours as needed for pain.  Modified  Medications   No medications on file  Discontinued Medications   FOLIC ACID (FOLVITE) 1 MG TABLET    Take 1 mg by mouth daily.   FUROSEMIDE (LASIX) 40 MG TABLET    Take 40 mg by mouth daily.   HYDROCORTISONE 1 % LOTION    Apply 1 application topically 2 (two) times daily as needed.    Physical Exam: Filed Vitals:   06/07/14 1354  BP: 130/70  Pulse: 70  Temp: 97.3 F (36.3 C)  Resp: 16  Height: 5\' 2"  (1.575 m)  Weight: 318 lb (144.244 kg)  Physical Exam  Constitutional:  Morbidly obese black female seated in her wheelchair  HENT:  Head: Normocephalic and atraumatic.  Cardiovascular: Normal rate, regular rhythm and normal heart sounds.   Pulmonary/Chest: Effort normal and breath sounds normal. Right breast exhibits skin change. Right breast exhibits no inverted nipple, no mass, no nipple  discharge and no tenderness. Left breast exhibits skin change. Left breast exhibits no inverted nipple, no mass, no nipple discharge and no tenderness.  Very large, pendulous breasts make examination difficult  Abdominal: Soft. Bowel sounds are normal.  Neurological: She is alert.  Oriented to person and place only  Skin:  Bilateral pendulous breasts with scaling and pruritus of areolar areas  Psychiatric: She has a normal mood and affect.    Labs reviewed: Lab Results  Component Value Date   HGBA1C 9.0* 04/18/2014    Liver Function Tests:  Recent Labs  02/25/14  AST 20  ALT 20  ALKPHOS 78    CBC:  Recent Labs  02/25/14  WBC 5.0  HGB 11.3*  HCT 36  PLT 192   Lab Results  Component Value Date   TSH 1.73 02/25/2014    Assessment/Plan 1. Pruritus -of areolar areas with dry, scaling skin -unclear if this is from irritation due to pendulous breasts hitting thighs or an internal etiology -obtain bilateral breast ultrasounds as pt unable to stand due to right LE bka  2. Type II or unspecified type diabetes mellitus with peripheral circulatory disorders, not stated as uncontrolled(250.70) -noncompliant with diet -cont current insulin regimen as she has some low normal glucose values scattered throughout the day so she is at risk of hypoglycemia if her insulins are increased, but hba1c is above goal  3. Essential hypertension, benign -bp at goal with current meds--she has an ace allergy  4. Chronic diastolic heart failure -is on asa,  bp controlled, no recent flares despite dietary nonadherence  5. Reflux esophagitis -stable with omeprazole  6. Morbid obesity -does not exercise, sits in her room reading or goes to activities, will not wear cpap  Family/ staff Communication: seen with unit supervisor  Goals of care: long term care resident, full code  Tests:  Bilateral breast ultrasounds

## 2014-06-07 NOTE — Telephone Encounter (Signed)
[facility] 

## 2014-06-09 ENCOUNTER — Other Ambulatory Visit: Payer: Self-pay | Admitting: Internal Medicine

## 2014-06-09 DIAGNOSIS — Z872 Personal history of diseases of the skin and subcutaneous tissue: Secondary | ICD-10-CM

## 2014-06-09 DIAGNOSIS — R234 Changes in skin texture: Secondary | ICD-10-CM

## 2014-06-09 DIAGNOSIS — Z1231 Encounter for screening mammogram for malignant neoplasm of breast: Secondary | ICD-10-CM

## 2014-06-09 DIAGNOSIS — N63 Unspecified lump in unspecified breast: Secondary | ICD-10-CM

## 2014-06-09 DIAGNOSIS — N6459 Other signs and symptoms in breast: Secondary | ICD-10-CM

## 2014-06-14 DIAGNOSIS — Z89519 Acquired absence of unspecified leg below knee: Secondary | ICD-10-CM | POA: Diagnosis not present

## 2014-06-14 DIAGNOSIS — S88119A Complete traumatic amputation at level between knee and ankle, unspecified lower leg, initial encounter: Secondary | ICD-10-CM | POA: Diagnosis not present

## 2014-06-14 DIAGNOSIS — Z794 Long term (current) use of insulin: Secondary | ICD-10-CM | POA: Diagnosis not present

## 2014-06-14 DIAGNOSIS — Z7982 Long term (current) use of aspirin: Secondary | ICD-10-CM | POA: Diagnosis not present

## 2014-06-14 DIAGNOSIS — J9600 Acute respiratory failure, unspecified whether with hypoxia or hypercapnia: Secondary | ICD-10-CM | POA: Diagnosis not present

## 2014-06-14 DIAGNOSIS — I5033 Acute on chronic diastolic (congestive) heart failure: Secondary | ICD-10-CM | POA: Diagnosis not present

## 2014-06-14 DIAGNOSIS — E1159 Type 2 diabetes mellitus with other circulatory complications: Secondary | ICD-10-CM | POA: Diagnosis present

## 2014-06-14 DIAGNOSIS — E876 Hypokalemia: Secondary | ICD-10-CM | POA: Diagnosis present

## 2014-06-14 DIAGNOSIS — G4733 Obstructive sleep apnea (adult) (pediatric): Secondary | ICD-10-CM | POA: Diagnosis present

## 2014-06-14 DIAGNOSIS — N189 Chronic kidney disease, unspecified: Secondary | ICD-10-CM | POA: Diagnosis present

## 2014-06-14 DIAGNOSIS — I798 Other disorders of arteries, arterioles and capillaries in diseases classified elsewhere: Secondary | ICD-10-CM | POA: Diagnosis present

## 2014-06-14 DIAGNOSIS — R0902 Hypoxemia: Secondary | ICD-10-CM | POA: Diagnosis not present

## 2014-06-14 DIAGNOSIS — E872 Acidosis: Secondary | ICD-10-CM | POA: Diagnosis present

## 2014-06-14 DIAGNOSIS — N390 Urinary tract infection, site not specified: Secondary | ICD-10-CM | POA: Diagnosis present

## 2014-06-14 DIAGNOSIS — J9620 Acute and chronic respiratory failure, unspecified whether with hypoxia or hypercapnia: Secondary | ICD-10-CM | POA: Diagnosis present

## 2014-06-14 DIAGNOSIS — A419 Sepsis, unspecified organism: Secondary | ICD-10-CM | POA: Diagnosis present

## 2014-06-14 DIAGNOSIS — G8929 Other chronic pain: Secondary | ICD-10-CM | POA: Diagnosis present

## 2014-06-14 DIAGNOSIS — J9610 Chronic respiratory failure, unspecified whether with hypoxia or hypercapnia: Secondary | ICD-10-CM | POA: Diagnosis not present

## 2014-06-14 DIAGNOSIS — E662 Morbid (severe) obesity with alveolar hypoventilation: Secondary | ICD-10-CM | POA: Diagnosis present

## 2014-06-14 DIAGNOSIS — F411 Generalized anxiety disorder: Secondary | ICD-10-CM | POA: Diagnosis not present

## 2014-06-14 DIAGNOSIS — B9562 Methicillin resistant Staphylococcus aureus infection as the cause of diseases classified elsewhere: Secondary | ICD-10-CM | POA: Diagnosis not present

## 2014-06-14 DIAGNOSIS — J988 Other specified respiratory disorders: Secondary | ICD-10-CM | POA: Diagnosis not present

## 2014-06-14 DIAGNOSIS — R6510 Systemic inflammatory response syndrome (SIRS) of non-infectious origin without acute organ dysfunction: Secondary | ICD-10-CM | POA: Diagnosis not present

## 2014-06-14 DIAGNOSIS — R6521 Severe sepsis with septic shock: Secondary | ICD-10-CM | POA: Diagnosis not present

## 2014-06-14 DIAGNOSIS — A4902 Methicillin resistant Staphylococcus aureus infection, unspecified site: Secondary | ICD-10-CM | POA: Diagnosis present

## 2014-06-14 DIAGNOSIS — R509 Fever, unspecified: Secondary | ICD-10-CM | POA: Diagnosis not present

## 2014-06-14 DIAGNOSIS — Z9119 Patient's noncompliance with other medical treatment and regimen: Secondary | ICD-10-CM | POA: Diagnosis not present

## 2014-06-14 DIAGNOSIS — E870 Hyperosmolality and hypernatremia: Secondary | ICD-10-CM | POA: Diagnosis not present

## 2014-06-14 DIAGNOSIS — I5032 Chronic diastolic (congestive) heart failure: Secondary | ICD-10-CM | POA: Diagnosis present

## 2014-06-14 DIAGNOSIS — L089 Local infection of the skin and subcutaneous tissue, unspecified: Secondary | ICD-10-CM | POA: Diagnosis not present

## 2014-06-14 DIAGNOSIS — R0602 Shortness of breath: Secondary | ICD-10-CM | POA: Diagnosis present

## 2014-06-14 DIAGNOSIS — IMO0002 Reserved for concepts with insufficient information to code with codable children: Secondary | ICD-10-CM | POA: Diagnosis not present

## 2014-06-14 DIAGNOSIS — N179 Acute kidney failure, unspecified: Secondary | ICD-10-CM | POA: Diagnosis present

## 2014-06-14 DIAGNOSIS — K219 Gastro-esophageal reflux disease without esophagitis: Secondary | ICD-10-CM | POA: Diagnosis present

## 2014-06-14 DIAGNOSIS — G9341 Metabolic encephalopathy: Secondary | ICD-10-CM | POA: Diagnosis present

## 2014-06-14 DIAGNOSIS — D696 Thrombocytopenia, unspecified: Secondary | ICD-10-CM | POA: Diagnosis not present

## 2014-06-14 DIAGNOSIS — F329 Major depressive disorder, single episode, unspecified: Secondary | ICD-10-CM | POA: Diagnosis present

## 2014-06-14 DIAGNOSIS — Z993 Dependence on wheelchair: Secondary | ICD-10-CM | POA: Diagnosis not present

## 2014-06-14 DIAGNOSIS — N63 Unspecified lump in unspecified breast: Secondary | ICD-10-CM | POA: Diagnosis not present

## 2014-06-14 DIAGNOSIS — Z6841 Body Mass Index (BMI) 40.0 and over, adult: Secondary | ICD-10-CM | POA: Diagnosis not present

## 2014-06-14 DIAGNOSIS — I509 Heart failure, unspecified: Secondary | ICD-10-CM | POA: Diagnosis present

## 2014-06-14 DIAGNOSIS — Z23 Encounter for immunization: Secondary | ICD-10-CM | POA: Diagnosis not present

## 2014-06-14 DIAGNOSIS — Z515 Encounter for palliative care: Secondary | ICD-10-CM | POA: Diagnosis not present

## 2014-06-14 DIAGNOSIS — I739 Peripheral vascular disease, unspecified: Secondary | ICD-10-CM | POA: Diagnosis present

## 2014-06-14 DIAGNOSIS — Z888 Allergy status to other drugs, medicaments and biological substances status: Secondary | ICD-10-CM | POA: Diagnosis not present

## 2014-06-14 DIAGNOSIS — J810 Acute pulmonary edema: Secondary | ICD-10-CM | POA: Diagnosis not present

## 2014-06-14 DIAGNOSIS — I129 Hypertensive chronic kidney disease with stage 1 through stage 4 chronic kidney disease, or unspecified chronic kidney disease: Secondary | ICD-10-CM | POA: Diagnosis present

## 2014-06-14 DIAGNOSIS — G4700 Insomnia, unspecified: Secondary | ICD-10-CM | POA: Diagnosis not present

## 2014-06-15 ENCOUNTER — Other Ambulatory Visit: Payer: Medicare Other

## 2014-06-15 ENCOUNTER — Ambulatory Visit
Admission: RE | Admit: 2014-06-15 | Discharge: 2014-06-15 | Disposition: A | Discharge status: Home / Self Care | Payer: Medicare Other | Source: Ambulatory Visit | Attending: Internal Medicine | Admitting: Internal Medicine

## 2014-06-15 DIAGNOSIS — N63 Unspecified lump in unspecified breast: Secondary | ICD-10-CM | POA: Diagnosis not present

## 2014-06-15 DIAGNOSIS — R234 Changes in skin texture: Secondary | ICD-10-CM

## 2014-06-15 DIAGNOSIS — N6459 Other signs and symptoms in breast: Secondary | ICD-10-CM

## 2014-06-16 ENCOUNTER — Other Ambulatory Visit: Payer: Medicare Other

## 2014-06-17 DIAGNOSIS — G4700 Insomnia, unspecified: Secondary | ICD-10-CM | POA: Diagnosis not present

## 2014-06-17 DIAGNOSIS — F3289 Other specified depressive episodes: Secondary | ICD-10-CM | POA: Diagnosis not present

## 2014-06-17 DIAGNOSIS — F411 Generalized anxiety disorder: Secondary | ICD-10-CM | POA: Diagnosis not present

## 2014-06-17 DIAGNOSIS — F329 Major depressive disorder, single episode, unspecified: Secondary | ICD-10-CM | POA: Diagnosis not present

## 2014-06-20 ENCOUNTER — Emergency Department (HOSPITAL_COMMUNITY): Payer: Medicare Other

## 2014-06-20 ENCOUNTER — Encounter (HOSPITAL_COMMUNITY): Payer: Self-pay | Admitting: Emergency Medicine

## 2014-06-20 ENCOUNTER — Inpatient Hospital Stay (HOSPITAL_COMMUNITY)
Admission: EM | Admit: 2014-06-20 | Discharge: 2014-06-30 | DRG: 870 | Disposition: A | Discharge status: Skilled Nursing Facility | Payer: Medicare Other | Attending: Pulmonary Disease | Admitting: Pulmonary Disease

## 2014-06-20 DIAGNOSIS — E662 Morbid (severe) obesity with alveolar hypoventilation: Secondary | ICD-10-CM | POA: Diagnosis present

## 2014-06-20 DIAGNOSIS — G9341 Metabolic encephalopathy: Secondary | ICD-10-CM | POA: Diagnosis present

## 2014-06-20 DIAGNOSIS — N189 Chronic kidney disease, unspecified: Secondary | ICD-10-CM | POA: Diagnosis present

## 2014-06-20 DIAGNOSIS — I739 Peripheral vascular disease, unspecified: Secondary | ICD-10-CM | POA: Diagnosis present

## 2014-06-20 DIAGNOSIS — K2100 Gastro-esophageal reflux disease with esophagitis, without bleeding: Secondary | ICD-10-CM

## 2014-06-20 DIAGNOSIS — Z993 Dependence on wheelchair: Secondary | ICD-10-CM | POA: Diagnosis not present

## 2014-06-20 DIAGNOSIS — I5033 Acute on chronic diastolic (congestive) heart failure: Secondary | ICD-10-CM | POA: Diagnosis not present

## 2014-06-20 DIAGNOSIS — N179 Acute kidney failure, unspecified: Secondary | ICD-10-CM | POA: Diagnosis present

## 2014-06-20 DIAGNOSIS — N390 Urinary tract infection, site not specified: Secondary | ICD-10-CM

## 2014-06-20 DIAGNOSIS — R7881 Bacteremia: Secondary | ICD-10-CM

## 2014-06-20 DIAGNOSIS — E876 Hypokalemia: Secondary | ICD-10-CM | POA: Diagnosis present

## 2014-06-20 DIAGNOSIS — J9692 Respiratory failure, unspecified with hypercapnia: Secondary | ICD-10-CM

## 2014-06-20 DIAGNOSIS — J9620 Acute and chronic respiratory failure, unspecified whether with hypoxia or hypercapnia: Secondary | ICD-10-CM | POA: Diagnosis present

## 2014-06-20 DIAGNOSIS — I509 Heart failure, unspecified: Secondary | ICD-10-CM | POA: Diagnosis present

## 2014-06-20 DIAGNOSIS — J301 Allergic rhinitis due to pollen: Secondary | ICD-10-CM | POA: Diagnosis not present

## 2014-06-20 DIAGNOSIS — F3289 Other specified depressive episodes: Secondary | ICD-10-CM

## 2014-06-20 DIAGNOSIS — IMO0002 Reserved for concepts with insufficient information to code with codable children: Secondary | ICD-10-CM

## 2014-06-20 DIAGNOSIS — R6510 Systemic inflammatory response syndrome (SIRS) of non-infectious origin without acute organ dysfunction: Secondary | ICD-10-CM

## 2014-06-20 DIAGNOSIS — J811 Chronic pulmonary edema: Secondary | ICD-10-CM | POA: Diagnosis not present

## 2014-06-20 DIAGNOSIS — Z452 Encounter for adjustment and management of vascular access device: Secondary | ICD-10-CM | POA: Diagnosis not present

## 2014-06-20 DIAGNOSIS — Z7982 Long term (current) use of aspirin: Secondary | ICD-10-CM

## 2014-06-20 DIAGNOSIS — I129 Hypertensive chronic kidney disease with stage 1 through stage 4 chronic kidney disease, or unspecified chronic kidney disease: Secondary | ICD-10-CM | POA: Diagnosis present

## 2014-06-20 DIAGNOSIS — E1149 Type 2 diabetes mellitus with other diabetic neurological complication: Secondary | ICD-10-CM | POA: Diagnosis not present

## 2014-06-20 DIAGNOSIS — Z888 Allergy status to other drugs, medicaments and biological substances status: Secondary | ICD-10-CM

## 2014-06-20 DIAGNOSIS — J9600 Acute respiratory failure, unspecified whether with hypoxia or hypercapnia: Secondary | ICD-10-CM | POA: Diagnosis not present

## 2014-06-20 DIAGNOSIS — Z9119 Patient's noncompliance with other medical treatment and regimen: Secondary | ICD-10-CM

## 2014-06-20 DIAGNOSIS — E872 Acidosis, unspecified: Secondary | ICD-10-CM | POA: Diagnosis present

## 2014-06-20 DIAGNOSIS — A419 Sepsis, unspecified organism: Secondary | ICD-10-CM | POA: Diagnosis present

## 2014-06-20 DIAGNOSIS — I798 Other disorders of arteries, arterioles and capillaries in diseases classified elsewhere: Secondary | ICD-10-CM | POA: Diagnosis present

## 2014-06-20 DIAGNOSIS — E870 Hyperosmolality and hypernatremia: Secondary | ICD-10-CM | POA: Diagnosis not present

## 2014-06-20 DIAGNOSIS — Z515 Encounter for palliative care: Secondary | ICD-10-CM

## 2014-06-20 DIAGNOSIS — E119 Type 2 diabetes mellitus without complications: Secondary | ICD-10-CM | POA: Diagnosis not present

## 2014-06-20 DIAGNOSIS — Z794 Long term (current) use of insulin: Secondary | ICD-10-CM

## 2014-06-20 DIAGNOSIS — K219 Gastro-esophageal reflux disease without esophagitis: Secondary | ICD-10-CM | POA: Diagnosis present

## 2014-06-20 DIAGNOSIS — J9691 Respiratory failure, unspecified with hypoxia: Secondary | ICD-10-CM

## 2014-06-20 DIAGNOSIS — Z6841 Body Mass Index (BMI) 40.0 and over, adult: Secondary | ICD-10-CM

## 2014-06-20 DIAGNOSIS — Z91199 Patient's noncompliance with other medical treatment and regimen due to unspecified reason: Secondary | ICD-10-CM | POA: Diagnosis not present

## 2014-06-20 DIAGNOSIS — J9819 Other pulmonary collapse: Secondary | ICD-10-CM | POA: Diagnosis not present

## 2014-06-20 DIAGNOSIS — Z23 Encounter for immunization: Secondary | ICD-10-CM | POA: Diagnosis not present

## 2014-06-20 DIAGNOSIS — E1159 Type 2 diabetes mellitus with other circulatory complications: Secondary | ICD-10-CM | POA: Diagnosis present

## 2014-06-20 DIAGNOSIS — R6520 Severe sepsis without septic shock: Secondary | ICD-10-CM

## 2014-06-20 DIAGNOSIS — D696 Thrombocytopenia, unspecified: Secondary | ICD-10-CM | POA: Diagnosis not present

## 2014-06-20 DIAGNOSIS — L299 Pruritus, unspecified: Secondary | ICD-10-CM

## 2014-06-20 DIAGNOSIS — J9622 Acute and chronic respiratory failure with hypercapnia: Secondary | ICD-10-CM

## 2014-06-20 DIAGNOSIS — R6521 Severe sepsis with septic shock: Secondary | ICD-10-CM | POA: Diagnosis present

## 2014-06-20 DIAGNOSIS — G8929 Other chronic pain: Secondary | ICD-10-CM

## 2014-06-20 DIAGNOSIS — E559 Vitamin D deficiency, unspecified: Secondary | ICD-10-CM

## 2014-06-20 DIAGNOSIS — G4733 Obstructive sleep apnea (adult) (pediatric): Secondary | ICD-10-CM | POA: Diagnosis present

## 2014-06-20 DIAGNOSIS — A4902 Methicillin resistant Staphylococcus aureus infection, unspecified site: Secondary | ICD-10-CM | POA: Diagnosis present

## 2014-06-20 DIAGNOSIS — R578 Other shock: Secondary | ICD-10-CM | POA: Diagnosis not present

## 2014-06-20 DIAGNOSIS — S81809A Unspecified open wound, unspecified lower leg, initial encounter: Secondary | ICD-10-CM | POA: Diagnosis not present

## 2014-06-20 DIAGNOSIS — R0989 Other specified symptoms and signs involving the circulatory and respiratory systems: Secondary | ICD-10-CM | POA: Diagnosis not present

## 2014-06-20 DIAGNOSIS — I5032 Chronic diastolic (congestive) heart failure: Secondary | ICD-10-CM | POA: Diagnosis present

## 2014-06-20 DIAGNOSIS — F329 Major depressive disorder, single episode, unspecified: Secondary | ICD-10-CM | POA: Diagnosis present

## 2014-06-20 DIAGNOSIS — Z4682 Encounter for fitting and adjustment of non-vascular catheter: Secondary | ICD-10-CM | POA: Diagnosis not present

## 2014-06-20 DIAGNOSIS — J988 Other specified respiratory disorders: Secondary | ICD-10-CM | POA: Diagnosis not present

## 2014-06-20 DIAGNOSIS — E1165 Type 2 diabetes mellitus with hyperglycemia: Secondary | ICD-10-CM

## 2014-06-20 DIAGNOSIS — S88119A Complete traumatic amputation at level between knee and ankle, unspecified lower leg, initial encounter: Secondary | ICD-10-CM | POA: Diagnosis not present

## 2014-06-20 DIAGNOSIS — L089 Local infection of the skin and subcutaneous tissue, unspecified: Secondary | ICD-10-CM | POA: Diagnosis not present

## 2014-06-20 DIAGNOSIS — J189 Pneumonia, unspecified organism: Secondary | ICD-10-CM | POA: Diagnosis not present

## 2014-06-20 DIAGNOSIS — R509 Fever, unspecified: Secondary | ICD-10-CM | POA: Diagnosis not present

## 2014-06-20 DIAGNOSIS — T798XXD Other early complications of trauma, subsequent encounter: Secondary | ICD-10-CM

## 2014-06-20 DIAGNOSIS — R0902 Hypoxemia: Secondary | ICD-10-CM | POA: Diagnosis not present

## 2014-06-20 DIAGNOSIS — I10 Essential (primary) hypertension: Secondary | ICD-10-CM

## 2014-06-20 DIAGNOSIS — E1140 Type 2 diabetes mellitus with diabetic neuropathy, unspecified: Secondary | ICD-10-CM

## 2014-06-20 DIAGNOSIS — R0602 Shortness of breath: Secondary | ICD-10-CM | POA: Diagnosis not present

## 2014-06-20 LAB — COMPREHENSIVE METABOLIC PANEL
ALT: 15 U/L (ref 0–35)
ANION GAP: 10 (ref 5–15)
AST: 20 U/L (ref 0–37)
Albumin: 2.4 g/dL — ABNORMAL LOW (ref 3.5–5.2)
Alkaline Phosphatase: 68 U/L (ref 39–117)
BILIRUBIN TOTAL: 0.6 mg/dL (ref 0.3–1.2)
BUN: 49 mg/dL — AB (ref 6–23)
CHLORIDE: 97 meq/L (ref 96–112)
CO2: 37 mEq/L — ABNORMAL HIGH (ref 19–32)
CREATININE: 1.65 mg/dL — AB (ref 0.50–1.10)
Calcium: 8.8 mg/dL (ref 8.4–10.5)
GFR calc non Af Amer: 31 mL/min — ABNORMAL LOW (ref >90)
GFR, EST AFRICAN AMERICAN: 36 mL/min — AB (ref >90)
GLUCOSE: 189 mg/dL — AB (ref 70–99)
POTASSIUM: 3.5 meq/L — AB (ref 3.7–5.3)
Sodium: 144 mEq/L (ref 137–147)
Total Protein: 7.3 g/dL (ref 6.0–8.3)

## 2014-06-20 LAB — I-STAT ARTERIAL BLOOD GAS, ED
Acid-Base Excess: 14 mmol/L — ABNORMAL HIGH (ref 0.0–2.0)
Acid-Base Excess: 12 mmol/L — ABNORMAL HIGH (ref 0.0–2.0)
Bicarbonate: 41.1 mEq/L — ABNORMAL HIGH (ref 20.0–24.0)
Bicarbonate: 40.9 mEq/L — ABNORMAL HIGH (ref 20.0–24.0)
O2 Saturation: 95 %
O2 Saturation: 41 %
PCO2 ART: 61.1 mmHg — AB (ref 35.0–45.0)
PH ART: 7.435 (ref 7.350–7.450)
TCO2: 43 mmol/L (ref 0–100)
TCO2: 43 mmol/L (ref 0–100)
pCO2 arterial: 75.8 mmHg (ref 35.0–45.0)
pH, Arterial: 7.339 — ABNORMAL LOW (ref 7.350–7.450)
pO2, Arterial: 76 mmHg — ABNORMAL LOW (ref 80.0–100.0)
pO2, Arterial: 26 mmHg — CL (ref 80.0–100.0)

## 2014-06-20 LAB — URINALYSIS, ROUTINE W REFLEX MICROSCOPIC
BILIRUBIN URINE: NEGATIVE
Glucose, UA: NEGATIVE mg/dL
KETONES UR: 15 mg/dL — AB
Nitrite: POSITIVE — AB
PROTEIN: 100 mg/dL — AB
SPECIFIC GRAVITY, URINE: 1.014 (ref 1.005–1.030)
UROBILINOGEN UA: 0.2 mg/dL (ref 0.0–1.0)
pH: 5.5 (ref 5.0–8.0)

## 2014-06-20 LAB — CBC WITH DIFFERENTIAL/PLATELET
Basophils Absolute: 0 10*3/uL (ref 0.0–0.1)
Basophils Relative: 0 % (ref 0–1)
Eosinophils Absolute: 0 10*3/uL (ref 0.0–0.7)
Eosinophils Relative: 0 % (ref 0–5)
HEMATOCRIT: 36.2 % (ref 36.0–46.0)
HEMOGLOBIN: 11.3 g/dL — AB (ref 12.0–15.0)
LYMPHS ABS: 0.7 10*3/uL (ref 0.7–4.0)
Lymphocytes Relative: 4 % — ABNORMAL LOW (ref 12–46)
MCH: 28.5 pg (ref 26.0–34.0)
MCHC: 31.2 g/dL (ref 30.0–36.0)
MCV: 91.2 fL (ref 78.0–100.0)
MONO ABS: 0.7 10*3/uL (ref 0.1–1.0)
MONOS PCT: 4 % (ref 3–12)
NEUTROS ABS: 15.1 10*3/uL — AB (ref 1.7–7.7)
NEUTROS PCT: 92 % — AB (ref 43–77)
Platelets: 184 10*3/uL (ref 150–400)
RBC: 3.97 MIL/uL (ref 3.87–5.11)
RDW: 15.9 % — ABNORMAL HIGH (ref 11.5–15.5)
WBC: 16.5 10*3/uL — ABNORMAL HIGH (ref 4.0–10.5)

## 2014-06-20 LAB — URINE MICROSCOPIC-ADD ON

## 2014-06-20 LAB — I-STAT CG4 LACTIC ACID, ED: Lactic Acid, Venous: 1.99 mmol/L (ref 0.5–2.2)

## 2014-06-20 MED ORDER — DEXTROSE 5 % IV SOLN
2.0000 g | Freq: Once | INTRAVENOUS | Status: AC
  Administered 2014-06-20: 2 g via INTRAVENOUS
  Filled 2014-06-20: qty 2

## 2014-06-20 MED ORDER — LIDOCAINE HCL (CARDIAC) 20 MG/ML IV SOLN
INTRAVENOUS | Status: AC
  Filled 2014-06-20: qty 5

## 2014-06-20 MED ORDER — VANCOMYCIN HCL 10 G IV SOLR
2500.0000 mg | Freq: Once | INTRAVENOUS | Status: AC
  Administered 2014-06-20: 2500 mg via INTRAVENOUS
  Filled 2014-06-20: qty 2500

## 2014-06-20 MED ORDER — ETOMIDATE 2 MG/ML IV SOLN
INTRAVENOUS | Status: AC
  Filled 2014-06-20: qty 20

## 2014-06-20 MED ORDER — SUCCINYLCHOLINE CHLORIDE 20 MG/ML IJ SOLN
INTRAMUSCULAR | Status: AC
  Administered 2014-06-21: 200 mg
  Filled 2014-06-20: qty 1

## 2014-06-20 MED ORDER — ROCURONIUM BROMIDE 50 MG/5ML IV SOLN
INTRAVENOUS | Status: AC
  Filled 2014-06-20: qty 2

## 2014-06-20 MED ORDER — SODIUM CHLORIDE 0.9 % IV SOLN
2000.0000 mg | INTRAVENOUS | Status: DC
  Administered 2014-06-21 – 2014-06-25 (×4): 2000 mg via INTRAVENOUS
  Filled 2014-06-20 (×4): qty 2000

## 2014-06-20 MED ORDER — DEXTROSE 5 % IV SOLN
2.0000 g | INTRAVENOUS | Status: DC
  Administered 2014-06-21 – 2014-06-22 (×2): 2 g via INTRAVENOUS
  Filled 2014-06-20 (×3): qty 2

## 2014-06-20 MED ORDER — SODIUM CHLORIDE 0.9 % IV BOLUS (SEPSIS)
30.0000 mL/kg | Freq: Once | INTRAVENOUS | Status: AC
  Administered 2014-06-20: 4326 mL via INTRAVENOUS
  Filled 2014-06-20: qty 4350

## 2014-06-20 MED ORDER — VANCOMYCIN HCL IN DEXTROSE 1-5 GM/200ML-% IV SOLN: 1000.0000 mg | Freq: Once | INTRAVENOUS | Status: DC

## 2014-06-20 MED ORDER — SODIUM CHLORIDE 0.9 % IV SOLN
1000.0000 mL | INTRAVENOUS | Status: DC
  Administered 2014-06-21: 1000 mL via INTRAVENOUS

## 2014-06-20 NOTE — ED Notes (Signed)
Phlebotomy at bedside.

## 2014-06-20 NOTE — H&P (Signed)
PULMONARY / CRITICAL CARE MEDICINE   Name: Heather Zimmerman MRN: [id number] DOB: [birth year]    ADMISSION DATE:  06/20/2014 CONSULTATION DATE:  06/20/2014  REFERRING MD :  EDP  CHIEF COMPLAINT:  SOB, fever  INITIAL PRESENTATION: 68 y.o. F from nursing home brought to ED on 9/7 with SOB and fever.  In ED, found to have septic shock presumed from urosepsis and was hypoxic and placed on BiPAP.  Initial ABG showed chronic hypercarbia.  Repeat ABG after BiPAP worse, required intubation. PCCM was consulted.  STUDIES:  9/7 CXR: hypoinflation, increased density in upper right paratracheal region. 9/7 L tib/fib Xray:  no fracture  SIGNIFICANT EVENTS: 9/7 - presented to ED with SOB and sepsis, found to have UTI.  Place on BiPAP but failed and required intubation.  LINES/TUBES: ETT 9/07 >>  L IJ CVL 9/07 >>   MICRO: MRSA PCR 9/07 >> POS Urine 9/07 >>  L leg wound 9/07 >>  Blood 9/07 >>  PCT 9/08: 8.47,      ANTIMICROBIALS: Vanc 9/07 >>  Cefepime 9/07 >>    HISTORY OF PRESENT ILLNESS:  Pt is encephalopathic; therefore, this HPI is obtained from chart review. Heather Zimmerman is a 68 y.o. Morbidly obese female who resides in nursing home and has PMH as outlined below.  She presented to the ED on 9/7 for evaluation of SOB and fever.  One night prior to presentation, she began to feel poorly.  Over the course of following day, she developed fever and mild respiratory distress, with SpO2 of 65%.  She was given a breathing treatment and placed on O2 with some improvement though only initially.  Symptoms began to worsen prompting EMS to be dispatched.  In ED, pt was found to be hypotensive, hypoxic despite supplemental O2, and febrile to 101.  She remained hypotensive after 3L IVF. ABG was obtained and showed chronic CO2 retention.  She was then placed on BiPAP and repeat ABG was actually worse with slight acidosis and worsened hypercarbia.  There was concern that pt may require intubation. UA revealed  gross UTI.  Pt also noted to have chronic LLE wound that had foul odor.  Xray was obtained and did not reveal any acute osseous abnormality. PCCM was consulted for evaluation of possible need for intubation as well as admission for urosepsis.  PAST MEDICAL HISTORY :  Past Medical History  Diagnosis Date  . 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 Essential hypertension, benign 12/31/2012  . Chronic diastolic heart failure 4/40/1027  . GERD 12/31/2012  . Unspecified vitamin D deficiency 12/31/2012  . Chronic pain 12/31/2012  . Venous stasis ulcers   . Venous insufficiency (chronic) (peripheral)    Past Surgical History  Procedure Laterality Date  . 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 Authorizing Provider  Acetaminophen (TYLENOL EXTRA STRENGTH PO) Take 1,000 mg by mouth. 2 TAB EVERY 6 HOURS AS NEEDED   Yes Historical Provider, MD  aluminum & magnesium hydroxide-simethicone (MYLANTA) 500-450-40 MG/5ML suspension Take 30 mLs by mouth every 4 (four) hours as needed for indigestion.   Yes Historical Provider, MD  aspirin 81 MG tablet Take 81 mg by mouth daily.   Yes Historical Provider, MD  cholecalciferol (VITAMIN D) 1000 UNITS tablet Take 2,000 Units by mouth daily.    Yes Historical Provider, MD  clonazePAM (KLONOPIN) 0.5 MG  tablet Take one tablet by mouth at bedtime. 06/07/14  Yes Mahima Bubba Camp, MD  diphenhydrAMINE (SOMINEX) 25 MG tablet Take 25 mg by mouth every 12 (twelve) hours as needed for itching or sleep.   Yes Historical Provider, MD  Fluticasone Propionate (FLONASE NA) Place 1 spray into the nose daily as needed. For nasal congestion   Yes Historical Provider, MD  gabapentin (NEURONTIN) 300 MG capsule Take 600 mg by mouth 2 (two) times daily.    Yes Historical Provider, MD  insulin aspart (NOVOLOG) 100 UNIT/ML  injection Inject 5 Units into the skin 3 (three) times daily before meals. 5 units daily before meals for cbg > or = to 150   Yes Historical Provider, MD  Insulin Glargine (LANTUS SOLOSTAR) 100 UNIT/ML Solostar Pen Inject 18 Units into the skin daily at 10 pm.   Yes Historical Provider, MD  ipratropium-albuterol (DUONEB) 0.5-2.5 (3) MG/3ML SOLN Take 3 mLs by nebulization every 8 (eight) hours as needed.   Yes Historical Provider, MD  losartan (COZAAR) 25 MG tablet Take 12.5 mg by mouth daily.    Yes Historical Provider, MD  magnesium oxide (MAG-OX) 400 MG tablet Take 400 mg by mouth 3 (three) times daily.    Yes Historical Provider, MD  Melatonin 3 MG TABS Take 6 mg by mouth at bedtime.    Yes Historical Provider, MD  MULTIPLE VITAMINS PO Take 1 tablet by mouth daily.   Yes Historical Provider, MD  omeprazole (PRILOSEC) 20 MG capsule Take 20 mg by mouth daily.   Yes Historical Provider, MD  potassium chloride SA (K-DUR,KLOR-CON) 20 MEQ tablet Take 20 mEq by mouth daily.   Yes Historical Provider, MD  sertraline (ZOLOFT) 50 MG tablet Take 50 mg by mouth daily.   Yes Historical Provider, MD  traMADol (ULTRAM) 50 MG tablet Take 50 mg by mouth every 6 (six) hours as needed for pain.   Yes Historical Provider, MD   Allergies  Allergen Reactions  . Ace Inhibitors     unknown    FAMILY HISTORY:  History reviewed. No pertinent family history. SOCIAL HISTORY:  reports that she has never smoked. She does not have any smokeless tobacco history on file. She reports that she does not drink alcohol. Her drug history is not on file.  REVIEW OF SYSTEMS:  Unable to obtain as pt is encephalopathic.  SUBJECTIVE:   VITAL SIGNS: Temp:  [101 F (38.3 C)] 101 F (38.3 C) (09/07 1944) Pulse Rate:  [75-97] 75 (09/07 2315) Resp:  [14-23] 16 (09/07 2315) BP: (85-108)/(41-66) 89/59 mmHg (09/07 2315) SpO2:  [91 %-99 %] 99 % (09/07 2315) Weight:  [144.244 kg (318 lb)-145.151 kg (320 lb)] 145.151 kg (320 lb)  (09/07 1944) HEMODYNAMICS:   VENTILATOR SETTINGS:   INTAKE / OUTPUT: Intake/Output     09/07 0701 - 09/08 0700   I.V. (mL/kg) 2000 (13.8)   Total Intake(mL/kg) 2000 (13.8)   Net +2000         PHYSICAL EXAMINATION: General: Morbidly obese female, minimally responsive. Neuro: Obtunded, minimally opens eyes to painful stimuli. HEENT: [place]/AT. PERRL, sclerae anicteric. Cardiovascular: RRR, no M/R/G appreciated with respect to body habitus. Lungs: Respirations shallow.  Low volumes on BiPAP.  CTA. Abdomen: Obese, BS x 4, soft, NT/ND.  Musculoskeletal: Right BKA.  LLE with chronic wound that has discolored scabs and venous stasis changes. Skin: Intact, warm, no rashes.  LABS:  CBC  Recent Labs Lab 06/20/14 2100  WBC 16.5*  HGB 11.3*  HCT 36.2  PLT 184   Coag's No results found for this basename: APTT, INR,  in the last 168 hours BMET  Recent Labs Lab 06/20/14 2100  NA 144  K 3.5*  CL 97  CO2 37*  BUN 49*  CREATININE 1.65*  GLUCOSE 189*   Electrolytes  Recent Labs Lab 06/20/14 2100  CALCIUM 8.8   Sepsis Markers  Recent Labs Lab 06/20/14 2112  LATICACIDVEN 1.99   ABG  Recent Labs Lab 06/20/14 2016 06/20/14 2306  PHART 7.435 7.339*  PCO2ART 61.1* 75.8*  PO2ART 76.0* 26.0*   Liver Enzymes  Recent Labs Lab 06/20/14 2100  AST 20  ALT 15  ALKPHOS 68  BILITOT 0.6  ALBUMIN 2.4*   Cardiac Enzymes No results found for this basename: TROPONINI, PROBNP,  in the last 168 hours Glucose No results found for this basename: GLUCAP,  in the last 168 hours  Imaging Dg Chest Port 1 View  06/20/2014   CLINICAL DATA:  Fever shortness-of-breath.  EXAM: PORTABLE CHEST - 1 VIEW  COMPARISON:  None.  FINDINGS: Patient slightly rotated to the right. Lungs are hypoinflated with mild prominence of the left perihilar markings. There is increased opacification in the right upper paratracheal region. No evidence of effusion or pneumothorax. Cardiac silhouette is  within normal. Remainder the exam is unremarkable.  IMPRESSION: Hypoinflation with mild prominence of the left perihilar markings which may be due to the degree of hypoinflation versus mild asymmetric vascular congestion.  Increased density in the upper right peritracheal region as cannot exclude a pulmonary parenchymal process versus mediastinal abnormality. Recommend PA and lateral chest x-ray for better evaluation versus CT scan if this abnormality persists.   Electronically Signed   By: Marin Olp M.D.   On: 06/20/2014 20:13   Dg Tibia/fibula Left Port  06/20/2014   CLINICAL DATA:  Code sepsis, left lower leg infection  EXAM: PORTABLE LEFT TIBIA AND FIBULA - 2 VIEW  COMPARISON:  None.  FINDINGS: No fracture or dislocation is seen.  The joint spaces are preserved.  Mild chronic periosteal thickening involving the fibular shaft.  Soft tissue calcifications along the lateral aspect of the proximal/mid calf.  No radiographic findings to suggest acute osteomyelitis.  IMPRESSION: No acute osseous abnormality is seen.   Electronically Signed   By: Julian Hy M.D.   On: 06/20/2014 20:15    ASSESSMENT / PLAN:  PULMONARY A: Acute on chronic respiratory failure Hx OSA / presumed OHS - non-compliant with nocturnal CPAP At risk pulmonary edema after 4L IVF  P:   Full mechanical support, wean as able. VAP bundle. SBT in AM. DuoNebs / Albuterol. ABG and CXR in AM.  CARDIOVASCULAR A:  Septic Shock - source urine +/- LLE wound. Hx HTN - now hypotensive Acute on chronic diastolic heart failure - no echo found in system. P:  Goal MAP > 65. Goal CVP 8 - 12. Levophed gtt. Check troponin. Repeat lactate. Echo. Hold outpatient losartan for now.  RENAL A:   AKI - SCr 1.65, unknown baseline. Hypokalemia P:   NS @ 75. Potassium 31mEq per tube. BMP in AM.  GASTROINTESTINAL A:   Nutrition GERD P:   NPO. TF if remains NPO > 24 hours. Pantoprazole.  HEMATOLOGIC A:   VTE  Prophylaxis P:  SCD's / Heparin. CBC in AM.  INFECTIOUS A:   Septic Shock - source urine +/- LLE wound. P:   BCx2 9/7 >>> UCx 9/7 >>> LLE Wound Cx 9/7 Abx: Vanc, start date 9/7, day 1/x.  Abx: Cefepime, start date 9/7, day 1/x. Wound care consult.  ENDOCRINE A:   DM   P:   CBG's q4hr. SSI.  NEUROLOGIC A:   Acute metabolic encephalopathy P:   RASS goal: 0 to -1. Sedation:  Fentanyl gtt. Daily WUA. Hold outpatient klonopin, diphenhydramine, gabapentin, melatonin, zoloft for now.  TODAY'S SUMMARY: 68 y.o. F from nursing home, brought to ED with SOB/fever.  Required intubation in ED.  Found to have urosepsis with septic shock.  Empiric abx, follow cultures.    Montey Hora, [place] Pgr: [phone]  or [phone] 06/20/2014, 11:54 PM  I have personally obtained a history, examined the patient, evaluated laboratory and imaging results, formulated the assessment and plan and placed orders. CRITICAL CARE: The patient is critically ill with multiple organ systems failure and requires high complexity decision making for assessment and support, frequent evaluation and titration of therapies, application of advanced monitoring technologies and extensive interpretation of multiple databases. Critical Care Time devoted to patient care services described in this note is 60 minutes.   Merton Border, MD ; [facility] [phone].  After 5:30 PM or weekends, call [phone]  Pulmonary and [place] Pager: [phone]

## 2014-06-20 NOTE — ED Provider Notes (Signed)
CSN: [id number]     Arrival date & time 06/20/14  1926 History   First MD Initiated Contact with Patient 06/20/14 1931     Chief Complaint  Patient presents with  . Fever  . Shortness of Breath     (Consider location/radiation/quality/duration/timing/severity/associated sxs/prior Treatment) HPI Comments: Patient presents to the ER for evaluation of fever and difficulty breathing. Patient started to feel poorly last night. Over the course of today she has developed a fever and mild respiratory distress. She was noted to have oxygen saturation of 65% at the nursing home, was administered albuterol and placed on oxygen. She had some improvement with her breathing after treatment. Patient was also administered Tylenol around 6 PM. Repeat acutely worsen again, EMS was called and brought her to the ER. They report that she has been hypotensive, systolic blood pressure is ED stretcher transport. She has been hypoxic despite supplemental oxygen.  Patient is a 68 y.o. female presenting with fever and shortness of breath.  Fever Shortness of Breath Associated symptoms: fever     Past Medical History  Diagnosis Date  . 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 Essential hypertension, benign 12/31/2012  . Chronic diastolic heart failure 1/75/1025  . GERD 12/31/2012  . Unspecified vitamin D deficiency 12/31/2012  . Chronic pain 12/31/2012  . Venous stasis ulcers   . Venous insufficiency (chronic) (peripheral)    Past Surgical History  Procedure Laterality Date  . 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 No pertinent family history. History  Substance Use Topics  . Smoking status: Never Smoker   . Smokeless tobacco: Not on file  . Alcohol Use: No   OB History   Grav Para Term Preterm Abortions TAB SAB Ect Mult Living                 Review of Systems  Constitutional: Positive  for fever.  Respiratory: Positive for shortness of breath.   Skin: Positive for wound (left lower leg).  All other systems reviewed and are negative.     Allergies  Ace inhibitors  Home Medications   Prior to Admission medications   Medication Sig Start Date End Date Taking? Authorizing Provider  Acetaminophen (TYLENOL EXTRA STRENGTH PO) Take 1,000 mg by mouth. 2 TAB EVERY 6 HOURS AS NEEDED   Yes Historical Provider, MD  aluminum & magnesium hydroxide-simethicone (MYLANTA) 500-450-40 MG/5ML suspension Take 30 mLs by mouth every 4 (four) hours as needed for indigestion.   Yes Historical Provider, MD  aspirin 81 MG tablet Take 81 mg by mouth daily.   Yes Historical Provider, MD  cholecalciferol (VITAMIN D) 1000 UNITS tablet Take 2,000 Units by mouth daily.    Yes Historical Provider, MD  clonazePAM (KLONOPIN) 0.5 MG tablet Take one tablet by mouth at bedtime. 06/07/14  Yes Mahima Bubba Camp, MD  diphenhydrAMINE (SOMINEX) 25 MG tablet Take 25 mg by mouth every 12 (twelve) hours as needed for itching or sleep.   Yes Historical Provider, MD  Fluticasone Propionate (FLONASE NA) Place 1 spray into the nose daily as needed. For nasal congestion   Yes Historical Provider, MD  gabapentin (NEURONTIN) 300 MG capsule Take 600 mg by mouth 2 (two) times daily.    Yes Historical Provider, MD  insulin aspart (NOVOLOG) 100 UNIT/ML injection Inject 5 Units into the skin 3 (three) times daily before meals. 5 units daily before meals for cbg > or = to  150   Yes Historical Provider, MD  Insulin Glargine (LANTUS SOLOSTAR) 100 UNIT/ML Solostar Pen Inject 18 Units into the skin daily at 10 pm.   Yes Historical Provider, MD  ipratropium-albuterol (DUONEB) 0.5-2.5 (3) MG/3ML SOLN Take 3 mLs by nebulization every 8 (eight) hours as needed.   Yes Historical Provider, MD  losartan (COZAAR) 25 MG tablet Take 12.5 mg by mouth daily.    Yes Historical Provider, MD  magnesium oxide (MAG-OX) 400 MG tablet Take 400 mg by mouth 3  (three) times daily.    Yes Historical Provider, MD  Melatonin 3 MG TABS Take 6 mg by mouth at bedtime.    Yes Historical Provider, MD  MULTIPLE VITAMINS PO Take 1 tablet by mouth daily.   Yes Historical Provider, MD  omeprazole (PRILOSEC) 20 MG capsule Take 20 mg by mouth daily.   Yes Historical Provider, MD  potassium chloride SA (K-DUR,KLOR-CON) 20 MEQ tablet Take 20 mEq by mouth daily.   Yes Historical Provider, MD  sertraline (ZOLOFT) 50 MG tablet Take 50 mg by mouth daily.   Yes Historical Provider, MD  traMADol (ULTRAM) 50 MG tablet Take 50 mg by mouth every 6 (six) hours as needed for pain.   Yes Historical Provider, MD   BP 89/59  Pulse 75  Temp(Src) 101 F (38.3 C) (Oral)  Resp 16  Ht 5\' 3"  (1.6 m)  Wt 320 lb (145.151 kg)  BMI 56.70 kg/m2  SpO2 99% Physical Exam  Constitutional: She is oriented to person, place, and time. She appears well-developed and well-nourished. No distress.  HENT:  Head: Normocephalic and atraumatic.  Right Ear: Hearing normal.  Left Ear: Hearing normal.  Nose: Nose normal.  Mouth/Throat: Oropharynx is clear and moist and mucous membranes are normal.  Eyes: Conjunctivae and EOM are normal. Pupils are equal, round, and reactive to light.  Neck: Normal range of motion. Neck supple.  Cardiovascular: Regular rhythm, S1 normal and S2 normal.  Exam reveals no gallop and no friction rub.   No murmur heard. Pulmonary/Chest: Accessory muscle usage present. Tachypnea noted. No respiratory distress. She has decreased breath sounds. She exhibits no tenderness.  Abdominal: Soft. Normal appearance and bowel sounds are normal. There is no hepatosplenomegaly. There is no tenderness. There is no rebound, no guarding, no tenderness at McBurney's point and negative Murphy's sign. No hernia.  Musculoskeletal: Normal range of motion.  Neurological: She is alert and oriented to person, place, and time. She has normal strength. No cranial nerve deficit or sensory deficit.  Coordination normal. GCS eye subscore is 4. GCS verbal subscore is 5. GCS motor subscore is 6.  Skin: Skin is warm, dry and intact. No rash noted. No cyanosis.     Psychiatric: She has a normal mood and affect. Her speech is normal and behavior is normal. Thought content normal.     ED Course  INTUBATION Date/Time: 06/21/2014 12:25 AM Performed by: Orpah Greek. Authorized by: Orpah Greek Consent: Verbal consent not obtained. The procedure was performed in an emergent situation. Patient identity confirmed: arm band and hospital-assigned identification number Time out: Immediately prior to procedure a "time out" was called to verify the correct patient, procedure, equipment, support staff and site/side marked as required. Indications: respiratory distress,  respiratory failure,  hypercapnia and  hypoxemia Intubation method: direct Patient status: paralyzed (RSI) Preoxygenation: BVM Sedatives: see MAR for details Paralytic: succinylcholine Laryngoscope size: Mac 4 Tube size: 7.5 mm Tube type: cuffed Number of attempts: 1 Cricoid pressure: no  Cords visualized: yes Post-procedure assessment: chest rise and CO2 detector Breath sounds: equal Cuff inflated: yes ETT to lip: 23 cm Tube secured with: ETT holder Chest x-ray interpreted by radiologist. Patient tolerance: Patient tolerated the procedure well with no immediate complications.   (including critical care time) Labs Review Labs Reviewed  CBC WITH DIFFERENTIAL - Abnormal; Notable for the following:    WBC 16.5 (*)    Hemoglobin 11.3 (*)    RDW 15.9 (*)    Neutrophils Relative % 92 (*)    Neutro Abs 15.1 (*)    Lymphocytes Relative 4 (*)    All other components within normal limits  COMPREHENSIVE METABOLIC PANEL - Abnormal; Notable for the following:    Potassium 3.5 (*)    CO2 37 (*)    Glucose, Bld 189 (*)    BUN 49 (*)    Creatinine, Ser 1.65 (*)    Albumin 2.4 (*)    GFR calc non Af Amer 31  (*)    GFR calc Af Amer 36 (*)    All other components within normal limits  URINALYSIS, ROUTINE W REFLEX MICROSCOPIC - Abnormal; Notable for the following:    Color, Urine BROWN (*)    APPearance TURBID (*)    Hgb urine dipstick LARGE (*)    Ketones, ur 15 (*)    Protein, ur 100 (*)    Nitrite POSITIVE (*)    Leukocytes, UA LARGE (*)    All other components within normal limits  URINE MICROSCOPIC-ADD ON - Abnormal; Notable for the following:    Squamous Epithelial / LPF MANY (*)    All other components within normal limits  I-STAT ARTERIAL BLOOD GAS, ED - Abnormal; Notable for the following:    pCO2 arterial 61.1 (*)    pO2, Arterial 76.0 (*)    Bicarbonate 41.1 (*)    Acid-Base Excess 14.0 (*)    All other components within normal limits  I-STAT ARTERIAL BLOOD GAS, ED - Abnormal; Notable for the following:    pH, Arterial 7.339 (*)    pCO2 arterial 75.8 (*)    pO2, Arterial 26.0 (*)    Bicarbonate 40.9 (*)    Acid-Base Excess 12.0 (*)    All other components within normal limits  CULTURE, BLOOD (ROUTINE X 2)  CULTURE, BLOOD (ROUTINE X 2)  URINE CULTURE  WOUND CULTURE  I-STAT CG4 LACTIC ACID, ED    Imaging Review Dg Chest Port 1 View  06/20/2014   CLINICAL DATA:  Fever shortness-of-breath.  EXAM: PORTABLE CHEST - 1 VIEW  COMPARISON:  None.  FINDINGS: Patient slightly rotated to the right. Lungs are hypoinflated with mild prominence of the left perihilar markings. There is increased opacification in the right upper paratracheal region. No evidence of effusion or pneumothorax. Cardiac silhouette is within normal. Remainder the exam is unremarkable.  IMPRESSION: Hypoinflation with mild prominence of the left perihilar markings which may be due to the degree of hypoinflation versus mild asymmetric vascular congestion.  Increased density in the upper right peritracheal region as cannot exclude a pulmonary parenchymal process versus mediastinal abnormality. Recommend PA and lateral  chest x-ray for better evaluation versus CT scan if this abnormality persists.   Electronically Signed   By: Marin Olp M.D.   On: 06/20/2014 20:13   Dg Tibia/fibula Left Port  06/20/2014   CLINICAL DATA:  Code sepsis, left lower leg infection  EXAM: PORTABLE LEFT TIBIA AND FIBULA - 2 VIEW  COMPARISON:  None.  FINDINGS: No  fracture or dislocation is seen.  The joint spaces are preserved.  Mild chronic periosteal thickening involving the fibular shaft.  Soft tissue calcifications along the lateral aspect of the proximal/mid calf.  No radiographic findings to suggest acute osteomyelitis.  IMPRESSION: No acute osseous abnormality is seen.   Electronically Signed   By: Julian Hy M.D.   On: 06/20/2014 20:15     EKG Interpretation None      MDM   Final diagnoses:  SIRS (systemic inflammatory response syndrome)  Chronic wound of extremity  UTI (lower urinary tract infection)  Respiratory failure with hypoxia and hypercapnia    Patient presents to the ER for evaluation of acute onset shortness of breath. Patient was hypoxic prior to arrival. Patient was treated for fever with Tylenol. Upon arrival she has surgical criteria with hypotension, fever, hypoxia. It was felt that she had a pulmonary source, it initiated on sepsis protocol including cefepime and vancomycin for empiric lung coverage.   Workup did not show any evidence of pneumonia on x-ray. Patient does have a chronic wound in her left lower leg, x-ray did not show any gas formation were obvious osteomyelitis. Urinalysis shows obvious infection, likely the source of the patient's fever.  She had a blood gas performed on arrival which showed hypoxia with CO2 retention. Patient was placed on BiPAP and after an hour, blood gas was worse. She is showing increased hypercarbia and acidosis. Critical care, Dr. Jimmy Footman was consulted to evaluate the patient. After initial evaluation, I see agreed with intubation. This was performed without  difficulty. Patient will be admitted to the ICU.  CRITICAL CARE Performed by: Orpah Greek   Total critical care time: 45min  Critical care time was exclusive of separately billable procedures and treating other patients.  Critical care was necessary to treat or prevent imminent or life-threatening deterioration.  Critical care was time spent personally by me on the following activities: development of treatment plan with patient and/or surrogate as well as nursing, discussions with consultants, evaluation of patient's response to treatment, examination of patient, obtaining history from patient or surrogate, ordering and performing treatments and interventions, ordering and review of laboratory studies, ordering and review of radiographic studies, pulse oximetry and re-evaluation of patient's condition.    Orpah Greek, MD 06/21/14 [phone]

## 2014-06-20 NOTE — ED Notes (Signed)
Pt is from [facility]. Per EMS, pt started feeling poorly last evening with a fever of 102.2. Today, the facility states that the pt's O2 sats dropped to 65% and the pt was placed on 2L of O2. Pt received 1000 mg of Tylenol and a duoneb at 1800. Pt is 83% on RA upon arrival to ER. Pt with wound on left lower leg. Facility reports that the dressing on the leg was changed last Tuesday.

## 2014-06-20 NOTE — Progress Notes (Signed)
ANTIBIOTIC CONSULT NOTE - INITIAL  Pharmacy Consult for Vancomycin and Cefepime Indication: rule out pneumonia  Allergies  Allergen Reactions  . Ace Inhibitors     unknown    Patient Measurements: Height: 5\' 3"  (160 cm) Weight: 320 lb (145.151 kg) IBW/kg (Calculated) : 52.4 Ideal Body Weight: 50 kg Adjusted Body Weight: 87.8 kg  Vital Signs: Temp: 101 F (38.3 C) (09/07 1944) Temp src: Oral (09/07 1944) BP: 87/46 mmHg (09/07 2130) Pulse Rate: 88 (09/07 2130) Intake/Output from previous day:   Intake/Output from this shift:    Labs:  Recent Labs  06/20/14 2100  WBC 16.5*  HGB 11.3*  PLT 184  CREATININE 1.65*   Estimated Creatinine Clearance: 45.5 ml/min (by C-G formula based on Cr of 1.65). No results found for this basename: VANCOTROUGH, VANCOPEAK, VANCORANDOM, GENTTROUGH, GENTPEAK, GENTRANDOM, TOBRATROUGH, TOBRAPEAK, TOBRARND, AMIKACINPEAK, AMIKACINTROU, AMIKACIN,  in the last 72 hours   Microbiology: No results found for this or any previous visit (from the past 720 hour(s)).  Medical History: Past Medical History  Diagnosis Date  . 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 Essential hypertension, benign 12/31/2012  . Chronic diastolic heart failure 8/91/6945  . GERD 12/31/2012  . Unspecified vitamin D deficiency 12/31/2012  . Chronic pain 12/31/2012  . Venous stasis ulcers   . Venous insufficiency (chronic) (peripheral)     Medications:   (Not in a hospital admission) Scheduled:   Infusions:  . sodium chloride    . vancomycin     Assessment: 68yo female presents to ED with fever and SOB. Pharmacy is consulted to start vancomycin and cefepime for presumed HCAP. Pt received one dose of cefepime 2g in the ED. Tmax 101, WBC 16.5. SCr is 1.65 at baseline, using normalized CrCl equation (without body weight), this gives a CrCl of ~35-56mL/min.  Goal of Therapy:   Vancomycin trough level 15-20 mcg/ml  Plan:  1. Vancomycin loading dose 2500mg  IV once followed by 2000mg  IV q24h 2. Start cefepime 2g IV q24h 3. Measure antibiotic drug levels at steady state 4. Follow up culture results  Sharalee Witman D. Tyshay Adee, PharmD, BCPS Clinical Pharmacist Pager: [phone] 06/20/2014 10:00 PM

## 2014-06-21 ENCOUNTER — Inpatient Hospital Stay (HOSPITAL_COMMUNITY): Payer: Medicare Other

## 2014-06-21 ENCOUNTER — Emergency Department (HOSPITAL_COMMUNITY): Payer: Medicare Other

## 2014-06-21 DIAGNOSIS — N189 Chronic kidney disease, unspecified: Secondary | ICD-10-CM | POA: Diagnosis present

## 2014-06-21 DIAGNOSIS — Z91199 Patient's noncompliance with other medical treatment and regimen due to unspecified reason: Secondary | ICD-10-CM | POA: Diagnosis not present

## 2014-06-21 DIAGNOSIS — Z888 Allergy status to other drugs, medicaments and biological substances status: Secondary | ICD-10-CM | POA: Diagnosis not present

## 2014-06-21 DIAGNOSIS — E876 Hypokalemia: Secondary | ICD-10-CM | POA: Diagnosis present

## 2014-06-21 DIAGNOSIS — E1149 Type 2 diabetes mellitus with other diabetic neurological complication: Secondary | ICD-10-CM | POA: Diagnosis not present

## 2014-06-21 DIAGNOSIS — R0989 Other specified symptoms and signs involving the circulatory and respiratory systems: Secondary | ICD-10-CM | POA: Diagnosis not present

## 2014-06-21 DIAGNOSIS — I739 Peripheral vascular disease, unspecified: Secondary | ICD-10-CM | POA: Diagnosis present

## 2014-06-21 DIAGNOSIS — E119 Type 2 diabetes mellitus without complications: Secondary | ICD-10-CM | POA: Diagnosis not present

## 2014-06-21 DIAGNOSIS — J9620 Acute and chronic respiratory failure, unspecified whether with hypoxia or hypercapnia: Secondary | ICD-10-CM | POA: Diagnosis present

## 2014-06-21 DIAGNOSIS — I5032 Chronic diastolic (congestive) heart failure: Secondary | ICD-10-CM | POA: Diagnosis present

## 2014-06-21 DIAGNOSIS — E872 Acidosis, unspecified: Secondary | ICD-10-CM | POA: Diagnosis present

## 2014-06-21 DIAGNOSIS — Z9119 Patient's noncompliance with other medical treatment and regimen: Secondary | ICD-10-CM | POA: Diagnosis not present

## 2014-06-21 DIAGNOSIS — E1159 Type 2 diabetes mellitus with other circulatory complications: Secondary | ICD-10-CM

## 2014-06-21 DIAGNOSIS — R6520 Severe sepsis without septic shock: Secondary | ICD-10-CM

## 2014-06-21 DIAGNOSIS — R6521 Severe sepsis with septic shock: Secondary | ICD-10-CM

## 2014-06-21 DIAGNOSIS — R6510 Systemic inflammatory response syndrome (SIRS) of non-infectious origin without acute organ dysfunction: Secondary | ICD-10-CM | POA: Diagnosis not present

## 2014-06-21 DIAGNOSIS — Z452 Encounter for adjustment and management of vascular access device: Secondary | ICD-10-CM | POA: Diagnosis not present

## 2014-06-21 DIAGNOSIS — Z7982 Long term (current) use of aspirin: Secondary | ICD-10-CM | POA: Diagnosis not present

## 2014-06-21 DIAGNOSIS — J301 Allergic rhinitis due to pollen: Secondary | ICD-10-CM | POA: Diagnosis not present

## 2014-06-21 DIAGNOSIS — F3289 Other specified depressive episodes: Secondary | ICD-10-CM | POA: Diagnosis present

## 2014-06-21 DIAGNOSIS — I509 Heart failure, unspecified: Secondary | ICD-10-CM | POA: Diagnosis present

## 2014-06-21 DIAGNOSIS — Z993 Dependence on wheelchair: Secondary | ICD-10-CM | POA: Diagnosis not present

## 2014-06-21 DIAGNOSIS — R0602 Shortness of breath: Secondary | ICD-10-CM | POA: Diagnosis present

## 2014-06-21 DIAGNOSIS — N390 Urinary tract infection, site not specified: Secondary | ICD-10-CM | POA: Diagnosis present

## 2014-06-21 DIAGNOSIS — J9622 Acute and chronic respiratory failure with hypercapnia: Secondary | ICD-10-CM

## 2014-06-21 DIAGNOSIS — G4733 Obstructive sleep apnea (adult) (pediatric): Secondary | ICD-10-CM | POA: Diagnosis present

## 2014-06-21 DIAGNOSIS — G9341 Metabolic encephalopathy: Secondary | ICD-10-CM | POA: Diagnosis present

## 2014-06-21 DIAGNOSIS — K219 Gastro-esophageal reflux disease without esophagitis: Secondary | ICD-10-CM | POA: Diagnosis present

## 2014-06-21 DIAGNOSIS — J9600 Acute respiratory failure, unspecified whether with hypoxia or hypercapnia: Secondary | ICD-10-CM | POA: Diagnosis not present

## 2014-06-21 DIAGNOSIS — J189 Pneumonia, unspecified organism: Secondary | ICD-10-CM | POA: Diagnosis not present

## 2014-06-21 DIAGNOSIS — A419 Sepsis, unspecified organism: Secondary | ICD-10-CM | POA: Diagnosis present

## 2014-06-21 DIAGNOSIS — I798 Other disorders of arteries, arterioles and capillaries in diseases classified elsewhere: Secondary | ICD-10-CM | POA: Diagnosis present

## 2014-06-21 DIAGNOSIS — I129 Hypertensive chronic kidney disease with stage 1 through stage 4 chronic kidney disease, or unspecified chronic kidney disease: Secondary | ICD-10-CM | POA: Diagnosis present

## 2014-06-21 DIAGNOSIS — A4902 Methicillin resistant Staphylococcus aureus infection, unspecified site: Secondary | ICD-10-CM | POA: Diagnosis present

## 2014-06-21 DIAGNOSIS — R578 Other shock: Secondary | ICD-10-CM

## 2014-06-21 DIAGNOSIS — N179 Acute kidney failure, unspecified: Secondary | ICD-10-CM | POA: Diagnosis present

## 2014-06-21 DIAGNOSIS — Z4682 Encounter for fitting and adjustment of non-vascular catheter: Secondary | ICD-10-CM | POA: Diagnosis not present

## 2014-06-21 DIAGNOSIS — E870 Hyperosmolality and hypernatremia: Secondary | ICD-10-CM | POA: Diagnosis not present

## 2014-06-21 DIAGNOSIS — J811 Chronic pulmonary edema: Secondary | ICD-10-CM | POA: Diagnosis not present

## 2014-06-21 DIAGNOSIS — F329 Major depressive disorder, single episode, unspecified: Secondary | ICD-10-CM | POA: Diagnosis present

## 2014-06-21 DIAGNOSIS — D696 Thrombocytopenia, unspecified: Secondary | ICD-10-CM | POA: Diagnosis not present

## 2014-06-21 DIAGNOSIS — Z515 Encounter for palliative care: Secondary | ICD-10-CM | POA: Diagnosis not present

## 2014-06-21 DIAGNOSIS — E662 Morbid (severe) obesity with alveolar hypoventilation: Secondary | ICD-10-CM | POA: Diagnosis present

## 2014-06-21 DIAGNOSIS — Z23 Encounter for immunization: Secondary | ICD-10-CM | POA: Diagnosis not present

## 2014-06-21 DIAGNOSIS — Z794 Long term (current) use of insulin: Secondary | ICD-10-CM | POA: Diagnosis not present

## 2014-06-21 DIAGNOSIS — Z6841 Body Mass Index (BMI) 40.0 and over, adult: Secondary | ICD-10-CM | POA: Diagnosis not present

## 2014-06-21 DIAGNOSIS — G8929 Other chronic pain: Secondary | ICD-10-CM | POA: Diagnosis present

## 2014-06-21 DIAGNOSIS — J9819 Other pulmonary collapse: Secondary | ICD-10-CM | POA: Diagnosis not present

## 2014-06-21 DIAGNOSIS — S81809A Unspecified open wound, unspecified lower leg, initial encounter: Secondary | ICD-10-CM | POA: Diagnosis not present

## 2014-06-21 DIAGNOSIS — J988 Other specified respiratory disorders: Secondary | ICD-10-CM | POA: Diagnosis not present

## 2014-06-21 DIAGNOSIS — I5033 Acute on chronic diastolic (congestive) heart failure: Secondary | ICD-10-CM | POA: Diagnosis not present

## 2014-06-21 DIAGNOSIS — S88119A Complete traumatic amputation at level between knee and ankle, unspecified lower leg, initial encounter: Secondary | ICD-10-CM | POA: Diagnosis not present

## 2014-06-21 LAB — PHOSPHORUS: Phosphorus: 2.1 mg/dL — ABNORMAL LOW (ref 2.3–4.6)

## 2014-06-21 LAB — CBC
HCT: 35.2 % — ABNORMAL LOW (ref 36.0–46.0)
Hemoglobin: 10.9 g/dL — ABNORMAL LOW (ref 12.0–15.0)
MCH: 28.7 pg (ref 26.0–34.0)
MCHC: 31 g/dL (ref 30.0–36.0)
MCV: 92.6 fL (ref 78.0–100.0)
Platelets: 173 10*3/uL (ref 150–400)
RBC: 3.8 MIL/uL — ABNORMAL LOW (ref 3.87–5.11)
RDW: 16.4 % — AB (ref 11.5–15.5)
WBC: 12.8 10*3/uL — AB (ref 4.0–10.5)

## 2014-06-21 LAB — GLUCOSE, CAPILLARY
GLUCOSE-CAPILLARY: 160 mg/dL — AB (ref 70–99)
GLUCOSE-CAPILLARY: 122 mg/dL — AB (ref 70–99)
GLUCOSE-CAPILLARY: 122 mg/dL — AB (ref 70–99)
GLUCOSE-CAPILLARY: 139 mg/dL — AB (ref 70–99)
GLUCOSE-CAPILLARY: 182 mg/dL — AB (ref 70–99)
Glucose-Capillary: 161 mg/dL — ABNORMAL HIGH (ref 70–99)

## 2014-06-21 LAB — I-STAT ARTERIAL BLOOD GAS, ED
Acid-Base Excess: 11 mmol/L — ABNORMAL HIGH (ref 0.0–2.0)
BICARBONATE: 37.2 meq/L — AB (ref 20.0–24.0)
O2 Saturation: 97 %
PO2 ART: 103 mmHg — AB (ref 80.0–100.0)
Patient temperature: 101
TCO2: 39 mmol/L (ref 0–100)
pCO2 arterial: 63.3 mmHg (ref 35.0–45.0)
pH, Arterial: 7.383 (ref 7.350–7.450)

## 2014-06-21 LAB — TROPONIN I: Troponin I: <0.3 ng/mL (ref <0.30)

## 2014-06-21 LAB — BASIC METABOLIC PANEL
ANION GAP: 9 (ref 5–15)
BUN: 42 mg/dL — ABNORMAL HIGH (ref 6–23)
CHLORIDE: 102 meq/L (ref 96–112)
CO2: 34 mEq/L — ABNORMAL HIGH (ref 19–32)
Calcium: 8 mg/dL — ABNORMAL LOW (ref 8.4–10.5)
Creatinine, Ser: 1.48 mg/dL — ABNORMAL HIGH (ref 0.50–1.10)
GFR calc Af Amer: 41 mL/min — ABNORMAL LOW (ref >90)
GFR, EST NON AFRICAN AMERICAN: 35 mL/min — AB (ref >90)
Glucose, Bld: 138 mg/dL — ABNORMAL HIGH (ref 70–99)
POTASSIUM: 3.6 meq/L — AB (ref 3.7–5.3)
Sodium: 145 mEq/L (ref 137–147)

## 2014-06-21 LAB — MRSA PCR SCREENING: MRSA by PCR: POSITIVE — AB

## 2014-06-21 LAB — PROCALCITONIN: PROCALCITONIN: 8.47 ng/mL

## 2014-06-21 LAB — MAGNESIUM: Magnesium: 2 mg/dL (ref 1.5–2.5)

## 2014-06-21 LAB — LACTIC ACID, PLASMA: LACTIC ACID, VENOUS: 1.1 mmol/L (ref 0.5–2.2)

## 2014-06-21 LAB — TRIGLYCERIDES: TRIGLYCERIDES: 157 mg/dL — AB (ref <150)

## 2014-06-21 MED ORDER — POTASSIUM CHLORIDE 20 MEQ/15ML (10%) PO LIQD
40.0000 meq | Freq: Once | ORAL | Status: AC
  Administered 2014-06-21: 40 meq

## 2014-06-21 MED ORDER — ACETAMINOPHEN 160 MG/5ML PO SOLN
650.0000 mg | Freq: Four times a day (QID) | ORAL | Status: DC | PRN
  Administered 2014-06-21 – 2014-06-24 (×4): 650 mg
  Filled 2014-06-21 (×3): qty 20.3

## 2014-06-21 MED ORDER — MUPIROCIN 2 % EX OINT
TOPICAL_OINTMENT | Freq: Two times a day (BID) | CUTANEOUS | Status: DC
  Administered 2014-06-21: 1 via NASAL
  Administered 2014-06-21: 22:00:00 via NASAL
  Administered 2014-06-22: 1 via NASAL
  Administered 2014-06-22: 08:00:00 via NASAL
  Administered 2014-06-23 – 2014-06-24 (×4): 1 via NASAL
  Administered 2014-06-25 (×2): via NASAL
  Administered 2014-06-26: 1 via NASAL
  Administered 2014-06-26 – 2014-06-30 (×7): via NASAL
  Filled 2014-06-21 (×3): qty 22

## 2014-06-21 MED ORDER — PANTOPRAZOLE SODIUM 40 MG IV SOLR
40.0000 mg | Freq: Every day | INTRAVENOUS | Status: DC
  Administered 2014-06-21: 40 mg via INTRAVENOUS
  Filled 2014-06-21 (×2): qty 40

## 2014-06-21 MED ORDER — FENTANYL CITRATE 0.05 MG/ML IJ SOLN: 50.0000 ug | Freq: Once | INTRAMUSCULAR | Status: DC

## 2014-06-21 MED ORDER — ETOMIDATE 2 MG/ML IV SOLN
INTRAVENOUS | Status: AC | PRN
  Administered 2014-06-21: 20 mg via INTRAVENOUS

## 2014-06-21 MED ORDER — SODIUM CHLORIDE 0.9 % IV SOLN
0.0000 ug/h | INTRAVENOUS | Status: DC
  Administered 2014-06-21: 50 ug/h via INTRAVENOUS
  Filled 2014-06-21: qty 50

## 2014-06-21 MED ORDER — IPRATROPIUM-ALBUTEROL 0.5-2.5 (3) MG/3ML IN SOLN
3.0000 mL | Freq: Four times a day (QID) | RESPIRATORY_TRACT | Status: DC
  Administered 2014-06-21 – 2014-06-27 (×25): 3 mL via RESPIRATORY_TRACT
  Filled 2014-06-21 (×25): qty 3

## 2014-06-21 MED ORDER — ALBUTEROL SULFATE (2.5 MG/3ML) 0.083% IN NEBU
2.5000 mg | INHALATION_SOLUTION | RESPIRATORY_TRACT | Status: DC | PRN
Start: 2014-06-21 — End: 2014-06-30

## 2014-06-21 MED ORDER — BUDESONIDE 0.25 MG/2ML IN SUSP
0.2500 mg | Freq: Four times a day (QID) | RESPIRATORY_TRACT | Status: DC
  Administered 2014-06-21 – 2014-06-23 (×8): 0.25 mg via RESPIRATORY_TRACT
  Filled 2014-06-21 (×12): qty 2

## 2014-06-21 MED ORDER — POTASSIUM PHOSPHATES 15 MMOLE/5ML IV SOLN
30.0000 mmol | Freq: Once | INTRAVENOUS | Status: DC
  Filled 2014-06-21: qty 10

## 2014-06-21 MED ORDER — INSULIN ASPART 100 UNIT/ML ~~LOC~~ SOLN
0.0000 [IU] | SUBCUTANEOUS | Status: DC
  Administered 2014-06-21: 3 [IU] via SUBCUTANEOUS
  Administered 2014-06-21 (×2): 4 [IU] via SUBCUTANEOUS
  Administered 2014-06-21 (×2): 3 [IU] via SUBCUTANEOUS
  Administered 2014-06-22 (×2): 4 [IU] via SUBCUTANEOUS
  Administered 2014-06-22: 3 [IU] via SUBCUTANEOUS
  Administered 2014-06-22: 4 [IU] via SUBCUTANEOUS
  Administered 2014-06-22: 3 [IU] via SUBCUTANEOUS
  Administered 2014-06-22: 4 [IU] via SUBCUTANEOUS
  Administered 2014-06-23 (×2): 7 [IU] via SUBCUTANEOUS
  Administered 2014-06-23 (×3): 4 [IU] via SUBCUTANEOUS
  Administered 2014-06-23 – 2014-06-24 (×3): 7 [IU] via SUBCUTANEOUS
  Administered 2014-06-24: 4 [IU] via SUBCUTANEOUS
  Administered 2014-06-24 (×3): 7 [IU] via SUBCUTANEOUS
  Administered 2014-06-24 – 2014-06-25 (×2): 4 [IU] via SUBCUTANEOUS
  Administered 2014-06-25: 7 [IU] via SUBCUTANEOUS
  Administered 2014-06-25: 4 [IU] via SUBCUTANEOUS
  Administered 2014-06-25: 7 [IU] via SUBCUTANEOUS
  Administered 2014-06-25: 4 [IU] via SUBCUTANEOUS
  Administered 2014-06-26: 7 [IU] via SUBCUTANEOUS
  Administered 2014-06-26: 4 [IU] via SUBCUTANEOUS
  Administered 2014-06-26: 7 [IU] via SUBCUTANEOUS
  Administered 2014-06-26: 3 [IU] via SUBCUTANEOUS
  Administered 2014-06-26: 4 [IU] via SUBCUTANEOUS

## 2014-06-21 MED ORDER — FENTANYL BOLUS VIA INFUSION
25.0000 ug | INTRAVENOUS | Status: DC | PRN
  Filled 2014-06-21: qty 50

## 2014-06-21 MED ORDER — POTASSIUM CHLORIDE 20 MEQ/15ML (10%) PO LIQD
ORAL | Status: AC
  Filled 2014-06-21: qty 30

## 2014-06-21 MED ORDER — CHLORHEXIDINE GLUCONATE 0.12 % MT SOLN
15.0000 mL | Freq: Two times a day (BID) | OROMUCOSAL | Status: DC
  Administered 2014-06-21 – 2014-06-26 (×12): 15 mL via OROMUCOSAL
  Filled 2014-06-21 (×11): qty 15

## 2014-06-21 MED ORDER — NOREPINEPHRINE BITARTRATE 1 MG/ML IV SOLN
2.0000 ug/min | INTRAVENOUS | Status: DC
  Administered 2014-06-21: 5 ug/min via INTRAVENOUS
  Filled 2014-06-21: qty 16

## 2014-06-21 MED ORDER — SODIUM CHLORIDE 0.9 % IV SOLN
250.0000 mL | INTRAVENOUS | Status: DC | PRN
  Administered 2014-06-22: 250 mL via INTRAVENOUS

## 2014-06-21 MED ORDER — CHLORHEXIDINE GLUCONATE CLOTH 2 % EX PADS
6.0000 | MEDICATED_PAD | Freq: Every day | CUTANEOUS | Status: AC
  Administered 2014-06-21 – 2014-06-25 (×5): 6 via TOPICAL

## 2014-06-21 MED ORDER — KCL IN DEXTROSE-NACL 20-5-0.45 MEQ/L-%-% IV SOLN
INTRAVENOUS | Status: DC
  Administered 2014-06-21 – 2014-06-22 (×2): via INTRAVENOUS
  Filled 2014-06-21 (×3): qty 1000

## 2014-06-21 MED ORDER — CETYLPYRIDINIUM CHLORIDE 0.05 % MT LIQD
7.0000 mL | Freq: Four times a day (QID) | OROMUCOSAL | Status: DC
  Administered 2014-06-21 – 2014-06-27 (×23): 7 mL via OROMUCOSAL

## 2014-06-21 MED ORDER — HEPARIN SODIUM (PORCINE) 5000 UNIT/ML IJ SOLN
5000.0000 [IU] | Freq: Three times a day (TID) | INTRAMUSCULAR | Status: DC
  Administered 2014-06-21 – 2014-06-30 (×29): 5000 [IU] via SUBCUTANEOUS
  Filled 2014-06-21 (×31): qty 1

## 2014-06-21 MED ORDER — SODIUM CHLORIDE 0.9 % IV SOLN
INTRAVENOUS | Status: DC
  Administered 2014-06-21: 09:00:00 via INTRAVENOUS

## 2014-06-21 MED ORDER — FENTANYL CITRATE 0.05 MG/ML IJ SOLN
50.0000 ug | Freq: Once | INTRAMUSCULAR | Status: AC
  Administered 2014-06-21: 50 ug via INTRAVENOUS

## 2014-06-21 MED ORDER — FENTANYL CITRATE 0.05 MG/ML IJ SOLN
50.0000 ug | Freq: Once | INTRAMUSCULAR | Status: DC
  Filled 2014-06-21: qty 2

## 2014-06-21 MED ORDER — PANTOPRAZOLE SODIUM 40 MG PO PACK
40.0000 mg | PACK | Freq: Every day | ORAL | Status: DC
  Administered 2014-06-21 – 2014-06-25 (×5): 40 mg
  Filled 2014-06-21 (×6): qty 20

## 2014-06-21 MED ORDER — PROPOFOL 10 MG/ML IV EMUL
0.0000 ug/kg/min | INTRAVENOUS | Status: DC
  Administered 2014-06-21 – 2014-06-22 (×4): 10 ug/kg/min via INTRAVENOUS
  Filled 2014-06-21 (×2): qty 200

## 2014-06-21 NOTE — Progress Notes (Signed)
Patient positive MRSA nasal pcr , contact precautions placed into chart and bactroban ointment ordered per protocol.  Rip Harbour, RN  Critical Care Team

## 2014-06-21 NOTE — Progress Notes (Signed)
  Echocardiogram 2D Echocardiogram has been performed.  Donata Clay 06/21/2014, 11:14 AM

## 2014-06-21 NOTE — Progress Notes (Signed)
UR Completed.  [phone]  

## 2014-06-21 NOTE — ED Notes (Addendum)
ET tube placed by Pollina, MD. 23cm at the tongue. Positive color change.

## 2014-06-21 NOTE — Progress Notes (Signed)
Patient intubated at 23 at the tongue with no difficulty. Positive color change and equal bilateral breath sounds.Patient sats at 28. Awaiting CXR.

## 2014-06-21 NOTE — Consult Note (Signed)
WOC wound consult note Reason for Consult: Consult requested for left leg chronic wound.  Pt is intubated and unable to discuss previous plan of care or etiology of wound. Wound type: Full thickness in patchy areas, scar tissue or dry scabbed areas to other sections. Measurement: Lower left calf is involved; 20cm in length and entire circumference of leg. Wound bed: 20% red and moist in the full thickness areas.  80% dry scabbing yellow skin which removes easily in plaque-like formations Drainage (amount, consistency, odor) Small amt yellow drainage, no odor. Periwound: Some patchy areas of scar tissue surrounding where previous wounds have healed.  Generalized edema. Dressing procedure/placement/frequency: Bactroban for moist healing and assisting with removal of nonviable skin. Please re-consult if further assistance is needed.  Thank-you,  Julien Girt MSN, [place], [place], [place], [employer]

## 2014-06-21 NOTE — ED Notes (Signed)
Attempted report 

## 2014-06-21 NOTE — Progress Notes (Signed)
eLink Physician-Brief Progress Note Patient Name: Heather Zimmerman. Fahy DOB: 1945-11-09 MRN: [id number]   Date of Service  06/21/2014  HPI/Events of Note  Hypokalemia and hypophosphatemia  eICU Interventions  Potassium and phos replaced     Intervention Category Intermediate Interventions: Electrolyte abnormality - evaluation and management  DETERDING,ELIZABETH 06/21/2014, 5:46 AM

## 2014-06-21 NOTE — Procedures (Signed)
Central Venous Catheter Insertion Procedure Note Heather Zimmerman. [name] January 18, 1946  Procedure: Insertion of Central Venous Catheter Indications: Assessment of intravascular volume, Drug and/or fluid administration and Frequent blood sampling  Procedure Details Consent: Unable to obtain consent because of altered level of consciousness. Time Out: Verified patient identification, verified procedure, site/side was marked, verified correct patient position, special equipment/implants available, medications/allergies/relevent history reviewed, required imaging and test results available.  Performed  Maximum sterile technique was used including antiseptics, cap, gloves, gown, hand hygiene, mask and sheet. Skin prep: Chlorhexidine; local anesthetic administered A antimicrobial bonded/coated triple lumen catheter was placed in the left internal jugular vein using the Seldinger technique.  Evaluation Blood flow good Complications: No apparent complications Patient did tolerate procedure well. Chest X-ray ordered to verify placement.  CXR: pending.  Procedure performed under direct ultrasound guidance for real time vessel cannulation.      Heather Zimmerman, [place] [facility] Pgr: [phone]  or [phone]   Heather Border, MD ; [facility] service Mobile [phone].  After 5:30 PM or weekends, call [phone]

## 2014-06-22 ENCOUNTER — Inpatient Hospital Stay (HOSPITAL_COMMUNITY): Payer: Medicare Other

## 2014-06-22 DIAGNOSIS — N390 Urinary tract infection, site not specified: Secondary | ICD-10-CM

## 2014-06-22 DIAGNOSIS — E119 Type 2 diabetes mellitus without complications: Secondary | ICD-10-CM

## 2014-06-22 LAB — COMPREHENSIVE METABOLIC PANEL
ALBUMIN: 2.2 g/dL — AB (ref 3.5–5.2)
ALT: 25 U/L (ref 0–35)
ANION GAP: 9 (ref 5–15)
AST: 28 U/L (ref 0–37)
Alkaline Phosphatase: 68 U/L (ref 39–117)
BILIRUBIN TOTAL: 0.4 mg/dL (ref 0.3–1.2)
BUN: 33 mg/dL — ABNORMAL HIGH (ref 6–23)
CHLORIDE: 104 meq/L (ref 96–112)
CO2: 32 mEq/L (ref 19–32)
CREATININE: 1.38 mg/dL — AB (ref 0.50–1.10)
Calcium: 8.3 mg/dL — ABNORMAL LOW (ref 8.4–10.5)
GFR calc Af Amer: 44 mL/min — ABNORMAL LOW (ref >90)
GFR calc non Af Amer: 38 mL/min — ABNORMAL LOW (ref >90)
Glucose, Bld: 197 mg/dL — ABNORMAL HIGH (ref 70–99)
Potassium: 3.8 mEq/L (ref 3.7–5.3)
Sodium: 145 mEq/L (ref 137–147)
Total Protein: 7.3 g/dL (ref 6.0–8.3)

## 2014-06-22 LAB — CBC
HCT: 35.4 % — ABNORMAL LOW (ref 36.0–46.0)
Hemoglobin: 11 g/dL — ABNORMAL LOW (ref 12.0–15.0)
MCH: 29 pg (ref 26.0–34.0)
MCHC: 31.1 g/dL (ref 30.0–36.0)
MCV: 93.4 fL (ref 78.0–100.0)
PLATELETS: 167 10*3/uL (ref 150–400)
RBC: 3.79 MIL/uL — ABNORMAL LOW (ref 3.87–5.11)
RDW: 16.5 % — AB (ref 11.5–15.5)
WBC: 12.8 10*3/uL — AB (ref 4.0–10.5)

## 2014-06-22 LAB — URINE CULTURE

## 2014-06-22 LAB — GLUCOSE, CAPILLARY
GLUCOSE-CAPILLARY: 123 mg/dL — AB (ref 70–99)
GLUCOSE-CAPILLARY: 135 mg/dL — AB (ref 70–99)
GLUCOSE-CAPILLARY: 160 mg/dL — AB (ref 70–99)
Glucose-Capillary: 168 mg/dL — ABNORMAL HIGH (ref 70–99)
Glucose-Capillary: 177 mg/dL — ABNORMAL HIGH (ref 70–99)
Glucose-Capillary: 197 mg/dL — ABNORMAL HIGH (ref 70–99)

## 2014-06-22 LAB — PROCALCITONIN: PROCALCITONIN: 5.14 ng/mL

## 2014-06-22 MED ORDER — FENTANYL CITRATE 0.05 MG/ML IJ SOLN
25.0000 ug | INTRAMUSCULAR | Status: DC | PRN
  Administered 2014-06-22 (×3): 100 ug via INTRAVENOUS
  Administered 2014-06-23: 50 ug via INTRAVENOUS
  Administered 2014-06-23: 100 ug via INTRAVENOUS
  Administered 2014-06-23 – 2014-06-24 (×2): 50 ug via INTRAVENOUS
  Administered 2014-06-24: 100 ug via INTRAVENOUS
  Administered 2014-06-24 – 2014-06-25 (×2): 50 ug via INTRAVENOUS
  Administered 2014-06-25 – 2014-06-26 (×5): 100 ug via INTRAVENOUS
  Filled 2014-06-22 (×16): qty 2

## 2014-06-22 MED ORDER — DIPHENHYDRAMINE HCL 25 MG PO CAPS
25.0000 mg | ORAL_CAPSULE | Freq: Four times a day (QID) | ORAL | Status: DC | PRN
  Administered 2014-06-22: 25 mg via ORAL
  Filled 2014-06-22: qty 1

## 2014-06-22 MED ORDER — ASPIRIN 81 MG PO TABS: 81.0000 mg | ORAL_TABLET | Freq: Every day | ORAL | Status: DC

## 2014-06-22 MED ORDER — VITAL HIGH PROTEIN PO LIQD
1000.0000 mL | ORAL | Status: DC
  Administered 2014-06-23: 06:00:00
  Administered 2014-06-23 – 2014-06-26 (×4): 1000 mL
  Filled 2014-06-22 (×10): qty 1000

## 2014-06-22 MED ORDER — FREE WATER
200.0000 mL | Freq: Three times a day (TID) | Status: DC
  Administered 2014-06-22 – 2014-06-23 (×3): 200 mL

## 2014-06-22 MED ORDER — VITAL HIGH PROTEIN PO LIQD
1000.0000 mL | ORAL | Status: DC
  Filled 2014-06-22 (×2): qty 1000

## 2014-06-22 MED ORDER — PRO-STAT SUGAR FREE PO LIQD
30.0000 mL | Freq: Two times a day (BID) | ORAL | Status: AC
  Administered 2014-06-22 (×2): 30 mL
  Filled 2014-06-22 (×2): qty 30

## 2014-06-22 MED ORDER — SERTRALINE HCL 50 MG PO TABS
50.0000 mg | ORAL_TABLET | Freq: Every day | ORAL | Status: DC
  Administered 2014-06-22 – 2014-06-26 (×5): 50 mg
  Filled 2014-06-22 (×6): qty 1

## 2014-06-22 MED ORDER — PRO-STAT SUGAR FREE PO LIQD
30.0000 mL | Freq: Two times a day (BID) | ORAL | Status: DC
  Filled 2014-06-22 (×2): qty 30

## 2014-06-22 MED ORDER — GABAPENTIN 600 MG PO TABS
600.0000 mg | ORAL_TABLET | Freq: Two times a day (BID) | ORAL | Status: DC
Start: 2014-06-22 — End: 2014-06-27
  Administered 2014-06-22 – 2014-06-26 (×9): 600 mg
  Filled 2014-06-22 (×11): qty 1

## 2014-06-22 MED ORDER — ASPIRIN 81 MG PO CHEW
81.0000 mg | CHEWABLE_TABLET | Freq: Every day | ORAL | Status: DC
  Administered 2014-06-22 – 2014-06-26 (×5): 81 mg
  Filled 2014-06-22 (×5): qty 1

## 2014-06-22 NOTE — Progress Notes (Signed)
INITIAL NUTRITION ASSESSMENT  DOCUMENTATION CODES Per approved criteria  -Morbid Obesity   INTERVENTION:  Utilize 37M PEPuP Protocol: initiate TF via OGT with Vital High Protein at 25 ml/h and Prostat 30 ml BID on day 1; on day 2, discontinue Prostat and increase to goal rate of 60 ml/h (1440 ml per day) to provide 1440 kcals (69% of estimated needs), 126 gm protein (100% of estimated needs), 1204 ml free water daily.  NUTRITION DIAGNOSIS: Inadequate oral intake related to inability to eat as evidenced by NPO status.   Goal: Enteral nutrition to provide 60-70% of estimated calorie needs (22-25 kcals/kg ideal body weight) and 100% of estimated protein needs, based on ASPEN guidelines for hypocaloric high protein feeding in critically ill obese individuals.  Monitor:  TF tolerance/adequacy, weight trend, labs, vent status.  Reason for Assessment: MD Consult for TF initiation and management.  68 y.o. female  Admitting Dx: SOB, fever  ASSESSMENT: 68 y.o. F from nursing home brought to ED on 9/7 with SOB and fever. In ED, found to have septic shock presumed from urosepsis and was hypoxic and placed on BiPAP. Initial ABG showed chronic hypercarbia. Repeat ABG after BiPAP worse, required intubation.  Patient is currently intubated on ventilator support MV: 6.8 L/min Temp (24hrs), Avg:100.1 F (37.8 C), Min:91.1 F (32.8 C), Max:101.6 F (38.7 C)  Propofol: off  Height: Ht Readings from Last 1 Encounters:  06/20/14 5\' 3"  (1.6 m)    Weight: Wt Readings from Last 1 Encounters:  06/22/14 338 lb 13.6 oz (153.7 kg)    Ideal Body Weight: 52.3 kg  % Ideal Body Weight: 294%  Wt Readings from Last 10 Encounters:  06/22/14 338 lb 13.6 oz (153.7 kg)  06/07/14 318 lb (144.244 kg)  05/06/14 313 lb (141.976 kg)  03/25/14 323 lb (146.512 kg)  02/24/14 325 lb (147.419 kg)  12/14/13 319 lb (144.697 kg)  11/26/13 319 lb (144.697 kg)  11/16/13 328 lb (148.78 kg)  10/26/13 328 lb  (148.78 kg)  10/22/13 342 lb (155.13 kg)    Usual Body Weight: 313-342 lb  % Usual Body Weight: 100%  BMI:  Body mass index is 60.04 kg/(m^2). class 3, extreme/morbid obesity  Estimated Nutritional Needs: Kcal: 2085 Protein: 105-130 gm Fluid: 2.1 L  Skin: chronic left leg wound  Diet Order:  NPO  EDUCATION NEEDS: -Education not appropriate at this time   Intake/Output Summary (Last 24 hours) at 06/22/14 1325 Last data filed at 06/22/14 1300  Gross per 24 hour  Intake 2124.15 ml  Output   1875 ml  Net 249.15 ml    Last BM: PTA   Labs:   Recent Labs Lab 06/20/14 2100 06/21/14 0500 06/22/14 0340  NA 144 145 145  K 3.5* 3.6* 3.8  CL 97 102 104  CO2 37* 34* 32  BUN 49* 42* 33*  CREATININE 1.65* 1.48* 1.38*  CALCIUM 8.8 8.0* 8.3*  MG  --  2.0  --   PHOS  --  2.1*  --   GLUCOSE 189* 138* 197*    CBG (last 3)   Recent Labs  06/22/14 0339 06/22/14 0746 06/22/14 1303  GLUCAP 197* 168* 123*    Scheduled Meds: . antiseptic oral rinse  7 mL Mouth Rinse QID  . budesonide  0.25 mg Nebulization 4 times per day  . ceFEPime (MAXIPIME) IV  2 g Intravenous Q24H  . chlorhexidine  15 mL Mouth Rinse BID  . Chlorhexidine Gluconate Cloth  6 each Topical Q0600  .  feeding supplement (PRO-STAT SUGAR FREE 64)  30 mL Per Tube BID  . feeding supplement (VITAL HIGH PROTEIN)  1,000 mL Per Tube Q24H  . fentaNYL  50 mcg Intravenous Once  . fentaNYL  50 mcg Intravenous Once  . free water  200 mL Per Tube 3 times per day  . heparin  5,000 Units Subcutaneous 3 times per day  . insulin aspart  0-20 Units Subcutaneous 6 times per day  . ipratropium-albuterol  3 mL Nebulization Q6H  . mupirocin ointment   Nasal BID  . pantoprazole sodium  40 mg Per Tube QHS  . potassium phosphate IVPB (mmol)  30 mmol Intravenous Once  . vancomycin  2,000 mg Intravenous Q24H    Continuous Infusions:   Past Medical History  Diagnosis Date  . Type II or unspecified type diabetes mellitus  with peripheral circulatory disorders, not stated as uncontrolled(250.70) 12/31/2012  . Essential hypertension, benign 12/31/2012  . Chronic diastolic heart failure 1/61/0960  . GERD 12/31/2012  . Unspecified vitamin D deficiency 12/31/2012  . Chronic pain 12/31/2012  . Venous stasis ulcers   . Venous insufficiency (chronic) (peripheral)     Past Surgical History  Procedure Laterality Date  . 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

## 2014-06-22 NOTE — Progress Notes (Signed)
PULMONARY / CRITICAL CARE MEDICINE   Name: Heather Zimmerman. Upperman MRN: [id number] DOB: 1945/12/01    ADMISSION DATE:  06/20/2014 CONSULTATION DATE:  06/22/2014  REFERRING MD :  EDP  CHIEF COMPLAINT:  SOB, fever  INITIAL PRESENTATION: 68 y.o. F from nursing home brought to ED on 9/7 with SOB and fever.  In ED, found to have septic shock presumed from urosepsis and was hypoxic and placed on BiPAP.  Initial ABG showed chronic hypercarbia.  Repeat ABG after BiPAP worse, required intubation. PCCM was consulted.  STUDIES:  9/07 CXR: hypoinflation, increased density in upper right paratracheal region. 90/7 L tib/fib Xray:  no fracture 9/08 TTE: normal LVEF, grade I DD, PA pressures appeared normal  SIGNIFICANT EVENTS: 9/07 presented to ED with SOB and sepsis, found to have UTI.  Place on BiPAP but failed and required intubation. Started on vasopressors with dx of septic shock.  9/08 failed SBT. High PIPs. Flow curve with appearance of obstruction. Nebulized steroids and BDs started. Weaning off vasopressors. Cardiac markers negative 9/08 WOC consult: Bactroban to wound on LLE 9/09 Off vasopressors. Failed SBT but PIPs improved. Cognition intact  LINES/TUBES: ETT 9/07 >>  L IJ CVL 9/07 >>   MICRO: MRSA PCR 9/07 >> POS Urine 9/07 >> > 100K multiple organisms L leg wound 9/07 >>  Blood 9/07 >>  PCT 9/08: 8.47,   9/09: 5.14,    ANTIMICROBIALS: Vanc 9/07 >>  Cefepime 9/07 >>    SUBJECTIVE:  RASS 0 to -1. + F/C. Failed SBT  VITAL SIGNS: Temp:  [91.1 F (32.8 C)-101.6 F (38.7 C)] 99.6 F (37.6 C) (09/09 1500) Pulse Rate:  [78-103] 92 (09/09 1500) Resp:  [8-35] 17 (09/09 1500) BP: (86-146)/(23-88) 97/43 mmHg (09/09 1500) SpO2:  [93 %-100 %] 99 % (09/09 1500) FiO2 (%):  [40 %] 40 % (09/09 1315) Weight:  [153.7 kg (338 lb 13.6 oz)] 153.7 kg (338 lb 13.6 oz) (09/09 0357) HEMODYNAMICS: CVP:  [11 mmHg-13 mmHg] 13 mmHg VENTILATOR SETTINGS: Vent Mode:  [-] PRVC FiO2 (%):  [40 %] 40 % Set  Rate:  [15 bmp] 15 bmp Vt Set:  [450 mL] 450 mL PEEP:  [5 cmH20] 5 cmH20 Plateau Pressure:  [23 cmH20-30 cmH20] 23 cmH20 INTAKE / OUTPUT: Intake/Output     09/08 0701 - 09/09 0700 09/09 0701 - 09/10 0700   I.V. (mL/kg) 2233.6 (14.5) 467.6 (3)   Total Intake(mL/kg) 2233.6 (14.5) 467.6 (3)   Urine (mL/kg/hr) 1600 (0.4) 700 (0.5)   Emesis/NG output     Total Output 1600 700   Net +633.6 -232.4          PHYSICAL EXAMINATION: General: Oobese, RASS 0 to -1. + F/C Neuro: no focal deficits HEENT: [place]/AT. PERRL, sclerae anicteric. Cardiovascular: RRR, no M Lungs: distant, few scattered wheezes Abdomen: Obese, BS x 4, soft, NT/ND.  Ext: Right BKA.  LLE with chronic circumferential wound that appears clean  LABS: I have reviewed all of today's lab results. Relevant abnormalities are discussed in the A/P section  CXR: vasc cong   ASSESSMENT / PLAN:  PULMONARY A: Acute on chronic respiratory failure OSA/OHS - non-compliant with nocturnal CPAP  Airflow obstruction evident on flow curves P:   Cont full vent support - settings reviewed and/or adjusted Cont vent bundle Daily SBT if/when meets criteria Cont nebulized steroids Cont nebulized BDs  CARDIOVASCULAR A:  Septic Shock Hx HTN Chronic diastolic heart failure P:  Goal MAP > 65. Goal CVP 8 - 12. Holding outpatient losartan  RENAL A:   AKI, nonoliguric - improving Hypokalemia, resolved P:   Monitor BMET intermittently Monitor I/Os Correct electrolytes as indicated  GASTROINTESTINAL A:   Obesity GERD P:   SUP: enteral pantoprazole Begin TFs 9/09  HEMATOLOGIC A:   Mild anemia without acute blood loss P:  DVT px: SQ heparin. Monitor CBC intermittently Transfuse per usual ICU guidelines  INFECTIOUS A:   Severe sepsis P:   Micro and abx as above  ENDOCRINE A:   DM II P:   Cont SSI  NEUROLOGIC A:   Acute metabolic encephalopathy P:   RASS goal: 0 to -1. Sedation: PRN fentantl Daily  WUA. Resume outpt gabapentin, sertraline. Holding clonazepam, diphenhydramine, melatonin.  TODAY'S SUMMARY:     I have personally obtained a history, examined the patient, evaluated laboratory and imaging results, formulated the assessment and plan and placed orders. CRITICAL CARE: The patient is critically ill with multiple organ systems failure and requires high complexity decision making for assessment and support, frequent evaluation and titration of therapies, application of advanced monitoring technologies and extensive interpretation of multiple databases. Critical Care Time devoted to patient care services described in this note is 40 minutes.   Merton Border, MD ; [place] [phone].  After 5:30 PM or weekends, call [phone] Pulmonary and [place] Pager: [phone]

## 2014-06-22 NOTE — Progress Notes (Signed)
Wasted 100 ml fentanyl in sink. Livia Snellen witnessed waste. Tiney Rouge RN

## 2014-06-23 ENCOUNTER — Inpatient Hospital Stay (HOSPITAL_COMMUNITY): Payer: Medicare Other

## 2014-06-23 LAB — BASIC METABOLIC PANEL WITH GFR
Anion gap: 8 (ref 5–15)
BUN: 30 mg/dL — ABNORMAL HIGH (ref 6–23)
CO2: 33 meq/L — ABNORMAL HIGH (ref 19–32)
Calcium: 8.5 mg/dL (ref 8.4–10.5)
Chloride: 106 meq/L (ref 96–112)
Creatinine, Ser: 1.25 mg/dL — ABNORMAL HIGH (ref 0.50–1.10)
GFR calc Af Amer: 50 mL/min — ABNORMAL LOW
GFR calc non Af Amer: 43 mL/min — ABNORMAL LOW
Glucose, Bld: 201 mg/dL — ABNORMAL HIGH (ref 70–99)
Potassium: 3.8 meq/L (ref 3.7–5.3)
Sodium: 147 meq/L (ref 137–147)

## 2014-06-23 LAB — CBC
HCT: 33.7 % — ABNORMAL LOW (ref 36.0–46.0)
Hemoglobin: 10.2 g/dL — ABNORMAL LOW (ref 12.0–15.0)
MCH: 28.6 pg (ref 26.0–34.0)
MCHC: 30.3 g/dL (ref 30.0–36.0)
MCV: 94.4 fL (ref 78.0–100.0)
Platelets: 147 K/uL — ABNORMAL LOW (ref 150–400)
RBC: 3.57 MIL/uL — ABNORMAL LOW (ref 3.87–5.11)
RDW: 16.6 % — ABNORMAL HIGH (ref 11.5–15.5)
WBC: 8.5 K/uL (ref 4.0–10.5)

## 2014-06-23 LAB — PROCALCITONIN: PROCALCITONIN: 2.92 ng/mL

## 2014-06-23 LAB — GLUCOSE, CAPILLARY
GLUCOSE-CAPILLARY: 199 mg/dL — AB (ref 70–99)
GLUCOSE-CAPILLARY: 215 mg/dL — AB (ref 70–99)
GLUCOSE-CAPILLARY: 200 mg/dL — AB (ref 70–99)
Glucose-Capillary: 185 mg/dL — ABNORMAL HIGH (ref 70–99)
Glucose-Capillary: 210 mg/dL — ABNORMAL HIGH (ref 70–99)
Glucose-Capillary: 211 mg/dL — ABNORMAL HIGH (ref 70–99)

## 2014-06-23 MED ORDER — INSULIN GLARGINE 100 UNIT/ML ~~LOC~~ SOLN
10.0000 [IU] | Freq: Every day | SUBCUTANEOUS | Status: DC
  Administered 2014-06-23 – 2014-06-24 (×2): 10 [IU] via SUBCUTANEOUS
  Filled 2014-06-23 (×3): qty 0.1

## 2014-06-23 MED ORDER — POTASSIUM CHLORIDE 20 MEQ/15ML (10%) PO LIQD
40.0000 meq | Freq: Once | ORAL | Status: AC
  Administered 2014-06-23: 40 meq
  Filled 2014-06-23: qty 30

## 2014-06-23 MED ORDER — FREE WATER
200.0000 mL | Status: DC
  Administered 2014-06-23 – 2014-06-24 (×6): 200 mL

## 2014-06-23 MED ORDER — BUDESONIDE 0.25 MG/2ML IN SUSP
0.2500 mg | Freq: Four times a day (QID) | RESPIRATORY_TRACT | Status: DC
  Administered 2014-06-24 – 2014-06-27 (×13): 0.25 mg via RESPIRATORY_TRACT
  Filled 2014-06-23 (×21): qty 2

## 2014-06-23 MED ORDER — DEXTROSE 5 % IV SOLN
2.0000 g | Freq: Three times a day (TID) | INTRAVENOUS | Status: DC
  Administered 2014-06-23 – 2014-06-25 (×7): 2 g via INTRAVENOUS
  Filled 2014-06-23 (×9): qty 2

## 2014-06-23 MED ORDER — FUROSEMIDE 10 MG/ML IJ SOLN
40.0000 mg | Freq: Once | INTRAMUSCULAR | Status: AC
  Administered 2014-06-23: 40 mg via INTRAVENOUS
  Filled 2014-06-23: qty 4

## 2014-06-23 MED ORDER — LIDOCAINE VISCOUS 2 % MT SOLN
15.0000 mL | Freq: Four times a day (QID) | OROMUCOSAL | Status: DC | PRN
  Administered 2014-06-23: 15 mL via OROMUCOSAL
  Filled 2014-06-23: qty 15

## 2014-06-23 MED ORDER — INFLUENZA VAC SPLIT QUAD 0.5 ML IM SUSY
0.5000 mL | PREFILLED_SYRINGE | INTRAMUSCULAR | Status: AC
  Administered 2014-06-24: 0.5 mL via INTRAMUSCULAR
  Filled 2014-06-23: qty 0.5

## 2014-06-23 NOTE — Progress Notes (Signed)
Inpatient Diabetes Program Recommendations  AACE/ADA: New Consensus Statement on Inpatient Glycemic Control (2013)  Target Ranges:  Prepandial:   less than 140 mg/dL      Peak postprandial:   less than 180 mg/dL (1-2 hours)      Critically ill patients:  140 - 180 mg/dL   Results for [name], Heather Zimmerman (MRN [id number]) as of 06/23/2014 08:05  Ref. Range 06/22/2014 00:12 06/22/2014 03:39 06/22/2014 07:46 06/22/2014 13:03 06/22/2014 15:27 06/22/2014 19:06 06/22/2014 23:52 06/23/2014 04:20  Glucose-Capillary Latest Range: 70-99 mg/dL 177 (H) 197 (H) 168 (H) 123 (H) 135 (H) 160 (H) 185 (H) 199 (H)   Diabetes history: DM2 Outpatient Diabetes medications: Lantus 18 units QHS, Novolog 5 units TID with meals Current orders for Inpatient glycemic control: Novolog 0-20 units Q4H  Inpatient Diabetes Program Recommendations Insulin - Basal: Noted CBGs have increased since tube feeding was started yesterday. Patient received a total of Novolog 22 units on 06/22/14. Please consider ordering low dose basal insulin; recommend starting with Lantus 10 units Q24H. Insulin-Tube Feeding Coverage: May want to consider ordering tube feeding coverage if CBGs consistently greater than 180 mg/dl while patient is receiving tube feedings.  Thanks, Barnie Alderman, RN, MSN, CCRN Diabetes Coordinator Inpatient Diabetes Program [phone] (Team Pager) [phone] ([place] office) [phone] [facility] office)

## 2014-06-23 NOTE — Progress Notes (Signed)
PULMONARY / CRITICAL CARE MEDICINE   Name: Heather Zimmerman. Mccumbers MRN: [id number] DOB: [birth year]    ADMISSION DATE:  06/20/2014 CONSULTATION DATE:  06/23/2014  REFERRING MD :  EDP  CHIEF COMPLAINT:  SOB, fever  INITIAL PRESENTATION: 68 y.o. F from nursing home brought to ED on 9/7 with SOB and fever.  In ED, found to have septic shock presumed from urosepsis and was hypoxic and placed on BiPAP.  Initial ABG showed chronic hypercarbia.  Repeat ABG after BiPAP worse, required intubation. PCCM was consulted.  STUDIES:  9/07 CXR: hypoinflation, increased density in upper right paratracheal region. 90/7 L tib/fib Xray:  no fracture 9/08 TTE: normal LVEF, grade I DD, PA pressures appeared normal  SIGNIFICANT EVENTS: 9/07 presented to ED with SOB and sepsis, found to have UTI.  Place on BiPAP but failed and required intubation. Started on vasopressors with dx of septic shock.  9/08 failed SBT. High PIPs. Flow curve with appearance of obstruction. Nebulized steroids and BDs started. Weaning off vasopressors. Cardiac markers negative 9/08 WOC consult: Bactroban to wound on LLE 9/09 Off vasopressors. Failed SBT but PIPs improved. Cognition intact 9/10 Failed SBT. Tolrated PS 15 cm H2)  LINES/TUBES: ETT 9/07 >>  L IJ CVL 9/07 >>   MICRO: MRSA PCR 9/07 >> POS Urine 9/07 >> > 100K multiple organisms L leg wound 9/07 >> few staph aureus, few group B strep Blood 9/07 >>  PCT 9/08: 8.47,   9/09: 5.14,  9/10: 2.92  ANTIMICROBIALS: Vanc 9/07 >>  Cefepime 9/07 >>    SUBJECTIVE:  RASS 0 to -1. + F/C. Failed SBT  VITAL SIGNS: Temp:  [99.3 F (37.4 C)-100.2 F (37.9 C)] 99.4 F (37.4 C) (09/10 1300) Pulse Rate:  [71-97] 71 (09/10 1300) Resp:  [11-27] 17 (09/10 1300) BP: (75-130)/(31-61) 99/55 mmHg (09/10 1300) SpO2:  [92 %-100 %] 97 % (09/10 1300) FiO2 (%):  [40 %] 40 % (09/10 1300) Weight:  [152.5 kg (336 lb 3.2 oz)] 152.5 kg (336 lb 3.2 oz) (09/10 0435) HEMODYNAMICS: CVP:  [12 mmHg-14  mmHg] 14 mmHg VENTILATOR SETTINGS: Vent Mode:  [-] PSV;CPAP FiO2 (%):  [40 %] 40 % Set Rate:  [15 bmp] 15 bmp Vt Set:  [450 mL] 450 mL PEEP:  [5 cmH20] 5 cmH20 Pressure Support:  [15 cmH20-20 cmH20] 15 cmH20 Plateau Pressure:  [21 cmH20-22 cmH20] 22 cmH20 INTAKE / OUTPUT: Intake/Output     09/09 0701 - 09/10 0700 09/10 0701 - 09/11 0700   I.V. (mL/kg) 1057.6 (6.9)    NG/GT 955 620   Total Intake(mL/kg) 2012.6 (13.2) 620 (4.1)   Urine (mL/kg/hr) 1700 (0.5) 605 (0.5)   Total Output 1700 605   Net +312.6 +15          PHYSICAL EXAMINATION: General: Oobese, RASS 0 to -1. + F/C Neuro: no focal deficits HEENT: [place]/AT. PERRL, sclerae anicteric. Cardiovascular: RRR, no M Lungs: distant, no wheezes Abdomen: Obese, BS x 4, soft, NT/ND.  Ext: Right BKA.  LLE with chronic circumferential wound that appears clean  LABS: I have reviewed all of today's lab results. Relevant abnormalities are discussed in the A/P section  CXR: [place] / PLAN:  PULMONARY A: Acute on chronic respiratory failure Possible component of pulm edema OSA/OHS - non-compliant with nocturnal CPAP  Airflow obstruction evident on flow curves, improved P:   Cont full vent support - settings reviewed and/or adjusted Cont vent bundle Daily SBT if/when meets criteria Cont nebulized steroids Cont nebulized  BDs Lasix X 1 9/10  CARDIOVASCULAR A:  Septic Shock Hx HTN Chronic diastolic heart failure P:  Goal MAP > 65. Goal CVP 8 - 12. Holding outpatient losartan  RENAL A:   AKI, nonoliguric - improving Hypokalemia, resolved P:   Monitor BMET intermittently Monitor I/Os Correct electrolytes as indicated  GASTROINTESTINAL A:   Obesity GERD, chronic PPI use P:   SUP: enteral pantoprazole Cont TFs   HEMATOLOGIC A:   Mild anemia without acute blood loss P:  DVT px: SQ heparin. Monitor CBC intermittently Transfuse per usual ICU guidelines  INFECTIOUS A:   Severe sepsis P:    Micro and abx as above  ENDOCRINE A:   DM II P:   Cont SSI Add Lantus 9/10  NEUROLOGIC A:   Acute encephalopathy, resolved ICU associated discomfort P:   RASS goal: 0 to -1. Sedation: PRN fentantl Daily WUA. Cont outpt gabapentin, sertraline.  Holding clonazepam, diphenhydramine, melatonin.  TODAY'S SUMMARY:     I have personally obtained a history, examined the patient, evaluated laboratory and imaging results, formulated the assessment and plan and placed orders. CRITICAL CARE: The patient is critically ill with multiple organ systems failure and requires high complexity decision making for assessment and support, frequent evaluation and titration of therapies, application of advanced monitoring technologies and extensive interpretation of multiple databases. Critical Care Time devoted to patient care services described in this note is 30 minutes.   Merton Border, MD ; [facility] [phone].  After 5:30 PM or weekends, call [phone] Pulmonary and [place] Pager: [phone]

## 2014-06-23 NOTE — Progress Notes (Signed)
Pt placed back on full vent support d/t multiple periods of apnea w/ vent going into back-up apnea vent mode.  RN aware

## 2014-06-23 NOTE — Progress Notes (Signed)
ANTIBIOTIC CONSULT NOTE - FOLLOW UP  Pharmacy Consult for Cefepime, Vancomycin Indication: rule out sepsis   Allergies  Allergen Reactions  . Ace Inhibitors     unknown  Patient Measurements: Height: 5\' 3"  (160 cm) Weight: 336 lb 3.2 oz (152.5 kg) IBW/kg (Calculated) : 52.4 Vital Signs: Temp: 99.4 F (37.4 C) (09/10 1300) BP: 99/55 mmHg (09/10 1300) Pulse Rate: 71 (09/10 1300) Intake/Output from previous day: 09/09 0701 - 09/10 0700 In: 2012.6 [I.V.:1057.6; NG/GT:955] Out: 1700 [Urine:1700] Intake/Output from this shift: Total I/O In: 620 [NG/GT:620] Out: 605 [Urine:605] Labs:  Recent Labs  06/21/14 0500 06/22/14 0340 06/23/14 0445  WBC 12.8* 12.8* 8.5  HGB 10.9* 11.0* 10.2*  PLT 173 167 147*  CREATININE 1.48* 1.38* 1.25*   Estimated Creatinine Clearance: 62 ml/min (by C-G formula based on Cr of 1.25). No results found for this basename: Letta Median, VANCORANDOM, GENTTROUGH, GENTPEAK, GENTRANDOM, TOBRATROUGH, TOBRAPEAK, TOBRARND, AMIKACINPEAK, AMIKACINTROU, AMIKACIN,  in the last 72 hours   Microbiology: 9/7 Wound (LLE)- few staph aureus, GBS 9/7 BCx2 >> ngtd 9/7 UCx >> non-predominant  Assessment: 38 YOF on day #4 of vancomycin and Zosyn for sepsis and LLE wound infection (few staph and GBS). WBC is within normal limits, Tmax 101.4. PCT is trending down. SCR is trending down with estimated CrCl 60-68mL/min.   Goal of Therapy:  Vancomycin trough level 15-20 mcg/ml  Plan:  1. Increase Cefepime to 2g IV q8h. 2. Continue Vancomycin 2g IV q24 with VT on 9/11 if continue therapy. 3. Monitor renal function and adjust dosing as needed.   Sloan Leiter, PharmD, BCPS Clinical Pharmacist [phone] 06/23/2014,1:51 PM

## 2014-06-23 NOTE — Progress Notes (Signed)
While this RT was in another procedure, per RN pt appeared to have increase WOB and pt appeared tired, pt was placed back on full vent support.  Pt appears to have tol well, no distress currently noted.

## 2014-06-23 NOTE — Progress Notes (Signed)
Pt continues to have low min. Volume alarm on vent w/ min. Volumes dropping to 4.0-4.5.  Increased PS to 20, RN aware.  Pt does not appear to be in distress.  VSS

## 2014-06-24 DIAGNOSIS — A419 Sepsis, unspecified organism: Secondary | ICD-10-CM | POA: Diagnosis not present

## 2014-06-24 LAB — GLUCOSE, CAPILLARY
Glucose-Capillary: 191 mg/dL — ABNORMAL HIGH (ref 70–99)
Glucose-Capillary: 192 mg/dL — ABNORMAL HIGH (ref 70–99)
Glucose-Capillary: 205 mg/dL — ABNORMAL HIGH (ref 70–99)
Glucose-Capillary: 218 mg/dL — ABNORMAL HIGH (ref 70–99)
Glucose-Capillary: 233 mg/dL — ABNORMAL HIGH (ref 70–99)
Glucose-Capillary: 234 mg/dL — ABNORMAL HIGH (ref 70–99)

## 2014-06-24 LAB — WOUND CULTURE: GRAM STAIN: NONE SEEN

## 2014-06-24 MED ORDER — POTASSIUM CHLORIDE 20 MEQ/15ML (10%) PO LIQD
40.0000 meq | Freq: Two times a day (BID) | ORAL | Status: AC
  Administered 2014-06-24 (×2): 40 meq
  Filled 2014-06-24 (×2): qty 30

## 2014-06-24 MED ORDER — FUROSEMIDE 10 MG/ML IJ SOLN
40.0000 mg | Freq: Four times a day (QID) | INTRAMUSCULAR | Status: AC
  Administered 2014-06-24 (×2): 40 mg via INTRAVENOUS
  Filled 2014-06-24 (×2): qty 4

## 2014-06-24 MED ORDER — FREE WATER
300.0000 mL | Status: DC
  Administered 2014-06-24 – 2014-06-25 (×6): 300 mL

## 2014-06-24 MED ORDER — ACETYLCYSTEINE 20 % IN SOLN
3.0000 mL | Freq: Two times a day (BID) | RESPIRATORY_TRACT | Status: AC
  Administered 2014-06-24 – 2014-06-26 (×5): 3 mL via RESPIRATORY_TRACT
  Administered 2014-06-27: 09:00:00 via RESPIRATORY_TRACT
  Filled 2014-06-24 (×7): qty 4

## 2014-06-24 NOTE — Progress Notes (Signed)
Inpatient Diabetes Program Recommendations  AACE/ADA: New Consensus Statement on Inpatient Glycemic Control (2013)  Target Ranges:  Prepandial:   less than 140 mg/dL      Peak postprandial:   less than 180 mg/dL (1-2 hours)      Critically ill patients:  140 - 180 mg/dL   Results for Azimi, DIVA LEMBERGER (MRN [id number]) as of 06/24/2014 10:35  Ref. Range 06/23/2014 07:29 06/23/2014 12:43 06/23/2014 15:43 06/23/2014 20:00 06/24/2014 00:12 06/24/2014 04:02 06/24/2014 08:22  Glucose-Capillary Latest Range: 70-99 mg/dL 210 (H) 215 (H) 200 (H) 211 (H) 233 (H) 192 (H) 191 (H)   Diabetes history: DM2  Outpatient Diabetes medications: Lantus 18 units QHS, Novolog 5 units TID with meals  Current orders for Inpatient glycemic control: Novolog 0-20 units Q4H, Lantus 10 units QHS  Inpatient Diabetes Program Recommendations Insulin - Basal: Please consider increasing Lantus to 12 units QHS. Insulin - Meal Coverage: If tube feeding is continued, please consider ordering Novolog 4 units Q4H for tube feeding coverage.  Thanks, Barnie Alderman, RN, MSN, CCRN Diabetes Coordinator Inpatient Diabetes Program [phone] (Team Pager) [phone] (AP office) [phone] [facility] office)

## 2014-06-24 NOTE — Progress Notes (Signed)
Dr. Alva Garnet made aware of a positive wound culture done 06/20/14 from patient's left foot. The wound culture is positive for MRSA per [facility]. The patient is currently receiving Vancomycin and maxipine as anti-microbial coverage. No new orders obtained.

## 2014-06-24 NOTE — Progress Notes (Signed)
Increased PS to 10 d/t pt w/ high RR 38-40 bpm, low vt, low min. Volume.  Pt tol change well so far.

## 2014-06-24 NOTE — Clinical Social Work Psychosocial (Signed)
Clinical Social Work Department BRIEF PSYCHOSOCIAL ASSESSMENT 06/24/2014  Patient:  Heather Zimmerman, Heather Zimmerman     Account Number:  [id number]     Admit date:  06/20/2014  Clinical Social Worker:  Delrae Sawyers  Date/Time:  06/24/2014 11:28 AM  Referred by:  Physician  Date Referred:  06/24/2014 Referred for  SNF Placement   Other Referral:   none.   Interview type:  Family Other interview type:   CSW spoke with pt's brother, Kathlene Cote [phone]).    PSYCHOSOCIAL DATA Living Status:  FACILITY Admitted from facility:  [facility] Level of care:  [place] Primary support name:  Kathlene Cote Primary support relationship to patient:  [place] Degree of support available:   Strong support system. Pt's brother stated he is next of kin for pt. Pt's brother reports pt has no children.    CURRENT CONCERNS Current Concerns  Post-Acute Placement   Other Concerns:   none.    SOCIAL WORK ASSESSMENT / PLAN CSW received referral stating pt admitted from facility [facility]). CSW spoke with pt's brother regarding discharge disposition. Pt's brother reports pt has lived at [facility] for past year and family would prefer for pt to return once medically stable for discharge.    CSW to continue to follow and assist with discharge planning needs.   Assessment/plan status:  Psychosocial Support/Ongoing Assessment of Needs Other assessment/ plan:   none.   Information/referral to community resources:   Pt to return to [facility].    PATIENT'S/FAMILY'S RESPONSE TO PLAN OF CARE: Pt's brother understanding and agreeable to CSW plan of care. Pt's brother expressed no further questions or concerns at this time.       Lubertha Sayres, [place] ([phone]) Licensed Clinical Social Worker Neuroscience [phone]) and Medical ICU (34M)

## 2014-06-24 NOTE — Care Management Note (Addendum)
    Page 1 of 1   06/30/2014     11:47:47 AM CARE MANAGEMENT NOTE 06/30/2014  Patient:  Heather Zimmerman, Heather Zimmerman   Account Number:  [id number]  Date Initiated:  06/21/2014  Documentation initiated by:  Luz Lex  Subjective/Objective Assessment:   Admitted with sepsis - UTI.Marland Kitchen   intubated and on pressors.     Action/Plan:   Anticipated DC Date:  06/30/2014   Anticipated DC Plan:  SKILLED NURSING FACILITY  In-house referral  Clinical Social Worker      DC Planning Services  CM consult      Choice offered to / List presented to:             Status of service:  Completed, signed off Medicare Important Message given?  YES (If response is "NO", the following Medicare IM given date fields will be blank) Date Medicare IM given:  06/24/2014 Medicare IM given by:  Elissa Hefty Date Additional Medicare IM given:  06/28/2014 Additional Medicare IM given by:  Tomi Bamberger  Discharge Disposition:  [place]  Per UR Regulation:  Reviewed for med. necessity/level of care/duration of stay  If discussed at [place] of Stay Meetings, dates discussed:    Comments:  06/30/14 [place], BSN [phone] patient being dc to snf today, CSW following.  [id number] [place],RN,BSN,CCM: patient being transferred from [id number] to med-surg floor/dc planning to begin/palliative consult requested for goals of care and pending at time of review.  Contact:  Dillard,Willie Relative [phone]

## 2014-06-24 NOTE — Progress Notes (Signed)
PULMONARY / CRITICAL CARE MEDICINE   Name: Heather Zimmerman. Trice MRN: [id number] DOB: 1946/02/27    ADMISSION DATE:  06/20/2014 CONSULTATION DATE:  06/24/2014  REFERRING MD :  EDP  CHIEF COMPLAINT:  SOB, fever  INITIAL PRESENTATION: 68 y.o. F from nursing home brought to ED on 9/7 with SOB and fever.  In ED, found to have septic shock presumed from urosepsis and was hypoxic and placed on BiPAP.  Initial ABG showed chronic hypercarbia.  Repeat ABG after BiPAP worse, required intubation. PCCM was consulted.  STUDIES:  9/07 CXR: hypoinflation, increased density in upper right paratracheal region. 90/7 L tib/fib Xray:  no fracture 9/08 TTE: normal LVEF, grade I DD, PA pressures appeared normal  SIGNIFICANT EVENTS: 9/07 presented to ED with SOB and sepsis, found to have UTI.  Place on BiPAP but failed and required intubation. Started on vasopressors with dx of septic shock.  9/08 failed SBT. High PIPs. Flow curve with appearance of obstruction. Nebulized steroids and BDs started. Weaning off vasopressors. Cardiac markers negative 9/08 WOC consult: Bactroban to wound on LLE 9/09 Off vasopressors. Failed SBT but PIPs improved. Cognition intact 9/10 Failed SBT. Tolerated PS 15 cm H2O 9/11 Failed SBT. Thick mucus. Nebulized NAC initiated. Tolerated PS 10 cm H2O  LINES/TUBES: ETT 9/07 >>  L IJ CVL 9/07 >>   MICRO: MRSA PCR 9/07 >> POS Urine 9/07 >> > 100K multiple organisms L leg wound 9/07 >> few MRSA, few group B strep Blood 9/07 >>  PCT 9/08: 8.47,   9/09: 5.14,  9/10: 2.92  ANTIMICROBIALS: Cefepime 9/07 >> 9/11 Vanc 9/07 >>   SUBJECTIVE:  RASS 0 to -1. + F/C. Failed SBT  VITAL SIGNS: Temp:  [99.2 F (37.3 C)-100.3 F (37.9 C)] 100 F (37.8 C) (09/11 1300) Pulse Rate:  [69-87] 86 (09/11 1300) Resp:  [13-27] 20 (09/11 1300) BP: (86-114)/(32-75) 98/51 mmHg (09/11 1300) SpO2:  [90 %-100 %] 96 % (09/11 1300) FiO2 (%):  [40 %] 40 % (09/11 1300) Weight:  [152.3 kg (335 lb 12.2 oz)]  152.3 kg (335 lb 12.2 oz) (09/11 0400) HEMODYNAMICS:   VENTILATOR SETTINGS: Vent Mode:  [-] PSV;CPAP FiO2 (%):  [40 %] 40 % Set Rate:  [15 bmp] 15 bmp Vt Set:  [450 mL] 450 mL PEEP:  [5 cmH20] 5 cmH20 Pressure Support:  [5 cmH20-15 cmH20] 10 cmH20 Plateau Pressure:  [24 cmH20-25 cmH20] 24 cmH20 INTAKE / OUTPUT: Intake/Output     09/10 0701 - 09/11 0700 09/11 0701 - 09/12 0700   I.V. (mL/kg) 580 (3.8) 60 (0.4)   Other 135    NG/GT 2500 860   IV Piggyback 150    Total Intake(mL/kg) 3365 (22.1) 920 (6)   Urine (mL/kg/hr) 2315 (0.6) 1050 (1)   Total Output 2315 1050   Net +1050 -130          PHYSICAL EXAMINATION: General: Oobese, RASS 0 to -1. + F/C Neuro: no focal deficits HEENT: [place]/AT. PERRL, sclerae anicteric. Cardiovascular: RRR, no M Lungs: distant, no wheezes Abdomen: Obese, BS x 4, soft, NT/ND.  Ext: Right BKA.  LLE with chronic circumferential wound that appears clean  LABS: I have reviewed all of today's lab results. Relevant abnormalities are discussed in the A/P section  CXR: NNF   ASSESSMENT / PLAN:  PULMONARY A: Acute on chronic respiratory failure Possible component of pulm edema OSA/OHS - non-compliant with nocturnal CPAP  Airflow obstruction evident on flow curves, improved P:   Cont full vent support - settings  reviewed and/or adjusted Cont vent bundle Daily SBT if/when meets criteria Cont nebulized steroids Cont nebulized BDs Lasix X 2 9/11  CARDIOVASCULAR A:  Septic Shock, resolved Hx HTN Chronic diastolic heart failure P:  Goal MAP > 65. Goal CVP 8 - 12. Holding outpatient losartan  RENAL A:   AKI, nonoliguric - improving Hypokalemia, resolved Hypernatremia P:   Monitor BMET intermittently Monitor I/Os Correct electrolytes as indicated Increase free water 9/11  GASTROINTESTINAL A:   Obesity GERD, chronic PPI use P:   SUP: enteral pantoprazole Cont TFs   HEMATOLOGIC A:   Mild anemia without acute blood  loss Borderline thrombocytopenia P:  DVT px: SQ heparin. Monitor CBC intermittently Transfuse per usual ICU guidelines  INFECTIOUS A:   Severe sepsis Mildly elevated PCT MRSA/Group B strep LLE cellulitis P:   Micro and abx as above  ENDOCRINE A:   DM II, inadequate control P:   Cont SSI Add Lantus 9/10 Consider increase of Lantus 9/12 if control still poor  NEUROLOGIC A:   Acute encephalopathy, resolved ICU associated discomfort P:   RASS goal: 0 to -1. Sedation: PRN fentanyl Daily WUA. Cont outpt gabapentin, sertraline.  Holding clonazepam, diphenhydramine, melatonin.  TODAY'S SUMMARY:   Diuresis today. Follow CXR. Daily WUA/SBT. Hopefully can extubate over WE. If not, will need to consider trach tube  I have personally obtained a history, examined the patient, evaluated laboratory and imaging results, formulated the assessment and plan and placed orders. CRITICAL CARE: The patient is critically ill with multiple organ systems failure and requires high complexity decision making for assessment and support, frequent evaluation and titration of therapies, application of advanced monitoring technologies and extensive interpretation of multiple databases. Critical Care Time devoted to patient care services described in this note is 35 minutes.   Merton Border, MD ; [facility] [phone].  After 5:30 PM or weekends, call [phone] Pulmonary and [place] Pager: [phone]

## 2014-06-25 ENCOUNTER — Inpatient Hospital Stay (HOSPITAL_COMMUNITY): Payer: Medicare Other

## 2014-06-25 DIAGNOSIS — J301 Allergic rhinitis due to pollen: Secondary | ICD-10-CM

## 2014-06-25 LAB — CBC
HCT: 29.6 % — ABNORMAL LOW (ref 36.0–46.0)
Hemoglobin: 9 g/dL — ABNORMAL LOW (ref 12.0–15.0)
MCH: 28 pg (ref 26.0–34.0)
MCHC: 30.4 g/dL (ref 30.0–36.0)
MCV: 92.2 fL (ref 78.0–100.0)
PLATELETS: 145 10*3/uL — AB (ref 150–400)
RBC: 3.21 MIL/uL — ABNORMAL LOW (ref 3.87–5.11)
RDW: 16.1 % — AB (ref 11.5–15.5)
WBC: 6.8 10*3/uL (ref 4.0–10.5)

## 2014-06-25 LAB — BASIC METABOLIC PANEL
ANION GAP: 7 (ref 5–15)
BUN: 30 mg/dL — ABNORMAL HIGH (ref 6–23)
CALCIUM: 8.8 mg/dL (ref 8.4–10.5)
CO2: 35 mEq/L — ABNORMAL HIGH (ref 19–32)
Chloride: 103 mEq/L (ref 96–112)
Creatinine, Ser: 1.28 mg/dL — ABNORMAL HIGH (ref 0.50–1.10)
GFR calc Af Amer: 48 mL/min — ABNORMAL LOW (ref >90)
GFR calc non Af Amer: 42 mL/min — ABNORMAL LOW (ref >90)
Glucose, Bld: 245 mg/dL — ABNORMAL HIGH (ref 70–99)
Potassium: 4.1 mEq/L (ref 3.7–5.3)
Sodium: 145 mEq/L (ref 137–147)

## 2014-06-25 LAB — GLUCOSE, CAPILLARY
GLUCOSE-CAPILLARY: 237 mg/dL — AB (ref 70–99)
GLUCOSE-CAPILLARY: 176 mg/dL — AB (ref 70–99)
GLUCOSE-CAPILLARY: 166 mg/dL — AB (ref 70–99)
Glucose-Capillary: 227 mg/dL — ABNORMAL HIGH (ref 70–99)
Glucose-Capillary: 237 mg/dL — ABNORMAL HIGH (ref 70–99)
Glucose-Capillary: 188 mg/dL — ABNORMAL HIGH (ref 70–99)

## 2014-06-25 LAB — VANCOMYCIN, TROUGH: VANCOMYCIN TR: 23.9 ug/mL — AB (ref 10.0–20.0)

## 2014-06-25 MED ORDER — INSULIN GLARGINE 100 UNIT/ML ~~LOC~~ SOLN
15.0000 [IU] | Freq: Every day | SUBCUTANEOUS | Status: DC
  Administered 2014-06-25: 15 [IU] via SUBCUTANEOUS
  Filled 2014-06-25 (×2): qty 0.15

## 2014-06-25 MED ORDER — VANCOMYCIN HCL 10 G IV SOLR: 1750.0000 mg | INTRAVENOUS | Status: DC

## 2014-06-25 MED ORDER — VANCOMYCIN HCL 10 G IV SOLR
1750.0000 mg | INTRAVENOUS | Status: DC
  Administered 2014-06-25 – 2014-06-26 (×2): 1750 mg via INTRAVENOUS
  Filled 2014-06-25 (×2): qty 1750

## 2014-06-25 MED ORDER — WHITE PETROLATUM GEL
Status: AC
  Administered 2014-06-25: 0.2
  Filled 2014-06-25: qty 5

## 2014-06-25 MED ORDER — FUROSEMIDE 10 MG/ML IJ SOLN
60.0000 mg | Freq: Four times a day (QID) | INTRAMUSCULAR | Status: AC
  Administered 2014-06-25 (×2): 60 mg via INTRAVENOUS
  Filled 2014-06-25 (×2): qty 6

## 2014-06-25 NOTE — Progress Notes (Signed)
Increased ps to 15 d/t high RR.  RN aware

## 2014-06-25 NOTE — Progress Notes (Signed)
ANTIBIOTIC CONSULT NOTE - FOLLOW UP  Pharmacy Consult for vancomycin  Indication: rule out sepsis  Allergies  Allergen Reactions  . Ace Inhibitors     unknown    Patient Measurements: Height: 5\' 3"  (160 cm) Weight: 335 lb 12.2 oz (152.3 kg) IBW/kg (Calculated) : 52.4 Adjusted Body Weight:   Vital Signs: Temp: 98.1 F (36.7 C) (09/11 2200) Temp src: Rectal (09/11 2000) BP: 111/63 mmHg (09/11 2341) Pulse Rate: 81 (09/11 2341) Intake/Output from previous day: 09/11 0701 - 09/12 0700 In: 2460 [I.V.:155; NG/GT:2205; IV Piggyback:100] Out: 2830 [Urine:2830] Intake/Output from this shift: Total I/O In: 690 [I.V.:40; NG/GT:600; IV Piggyback:50] Out: 525 [Urine:525]  Labs:  Recent Labs  06/22/14 0340 06/23/14 0445  WBC 12.8* 8.5  HGB 11.0* 10.2*  PLT 167 147*  CREATININE 1.38* 1.25*   Estimated Creatinine Clearance: 62 ml/min (by C-G formula based on Cr of 1.25).  Recent Labs  06/24/14 2230  VANCOTROUGH 23.9*     Microbiology: Recent Results (from the past 720 hour(s))  WOUND CULTURE     Status: None   Collection Time    06/20/14  8:11 PM      Result Value Ref Range Status   Specimen Description WOUND LEFT LEG   Final   Special Requests NONE   Final   Gram Stain     Final   Value: NO WBC SEEN     NO SQUAMOUS EPITHELIAL CELLS SEEN     NO ORGANISMS SEEN     Performed at [facility]   Culture     Final   Value: FEW METHICILLIN RESISTANT STAPHYLOCOCCUS AUREUS     Note: RIFAMPIN AND GENTAMICIN SHOULD NOT BE USED AS SINGLE DRUGS FOR TREATMENT OF STAPH INFECTIONS. This organism DOES NOT demonstrate inducible Clindamycin resistance in vitro. CRITICAL RESULT CALLED TO, READ BACK BY AND VERIFIED WITH: SARA G BY INGRAM      A 06/24/14     FEW GROUP B STREP(S.AGALACTIAE)ISOLATED     Note: TESTING AGAINST S. AGALACTIAE NOT ROUTINELY PERFORMED DUE TO PREDICTABILITY OF AMP/PEN/VAN SUSCEPTIBILITY.     Performed at [facility]   Report Status 06/24/2014  FINAL   Final   Organism ID, Bacteria METHICILLIN RESISTANT STAPHYLOCOCCUS AUREUS   Final  CULTURE, BLOOD (ROUTINE X 2)     Status: None   Collection Time    06/20/14  8:49 PM      Result Value Ref Range Status   Specimen Description BLOOD HAND RIGHT   Final   Special Requests BOTTLES DRAWN AEROBIC AND ANAEROBIC 5CC   Final   Culture  Setup Time     Final   Value: 06/21/2014 01:23     Performed at [facility]   Culture     Final   Value:        BLOOD CULTURE RECEIVED NO GROWTH TO DATE CULTURE WILL BE HELD FOR 5 DAYS BEFORE ISSUING A FINAL NEGATIVE REPORT     Performed at [facility]   Report Status PENDING   Incomplete  CULTURE, BLOOD (ROUTINE X 2)     Status: None   Collection Time    06/20/14  9:00 PM      Result Value Ref Range Status   Specimen Description BLOOD FOREARM RIGHT   Final   Special Requests BOTTLES DRAWN AEROBIC ONLY 5CC   Final   Culture  Setup Time     Final   Value: 06/21/2014 01:22     Performed at  [facility]     Final   Value:        BLOOD CULTURE RECEIVED NO GROWTH TO DATE CULTURE WILL BE HELD FOR 5 DAYS BEFORE ISSUING A FINAL NEGATIVE REPORT     Performed at [facility]   Report Status PENDING   Incomplete  URINE CULTURE     Status: None   Collection Time    06/20/14 10:33 PM      Result Value Ref Range Status   Specimen Description URINE, CATHETERIZED   Final   Special Requests NONE   Final   Culture  Setup Time     Final   Value: 06/20/2014 23:08     Performed at [facility]     Final   Value: >=100,000 COLONIES/ML     Performed at [facility]   Culture     Final   Value: Multiple bacterial morphotypes present, none predominant. Suggest appropriate recollection if clinically indicated.     Performed at [facility]   Report Status 06/22/2014 FINAL   Final  MRSA PCR SCREENING     Status: Abnormal   Collection Time    06/21/14  2:24 AM      Result Value Ref  Range Status   MRSA by PCR POSITIVE (*) NEGATIVE Final   Comment:            The GeneXpert MRSA Assay (FDA     approved for NASAL specimens     only), is one component of a     comprehensive MRSA colonization     surveillance program. It is not     intended to diagnose MRSA     infection nor to guide or     monitor treatment for     MRSA infections.     RESULT CALLED TO, READ BACK BY AND VERIFIED WITH:     CALLED TO RNRip Harbour [phone] @[phone]  [facility]    Anti-infectives   Start     Dose/Rate Route Frequency Ordered Stop   06/23/14 1400  ceFEPIme (MAXIPIME) 2 g in dextrose 5 % 50 mL IVPB     2 g 100 mL/hr over 30 Minutes Intravenous 3 times per day 06/23/14 1350     06/21/14 2300  vancomycin (VANCOCIN) 2,000 mg in sodium chloride 0.9 % 500 mL IVPB     2,000 mg 250 mL/hr over 120 Minutes Intravenous Every 24 hours 06/20/14 2201     06/21/14 2200  ceFEPIme (MAXIPIME) 2 g in dextrose 5 % 50 mL IVPB  Status:  Discontinued     2 g 100 mL/hr over 30 Minutes Intravenous Every 24 hours 06/20/14 2201 06/23/14 1350   06/20/14 2000  vancomycin (VANCOCIN) 2,500 mg in sodium chloride 0.9 % 500 mL IVPB     2,500 mg 250 mL/hr over 120 Minutes Intravenous  Once 06/20/14 1954 06/21/14 0209   06/20/14 1945  ceFEPIme (MAXIPIME) 2 g in dextrose 5 % 50 mL IVPB     2 g 100 mL/hr over 30 Minutes Intravenous  Once 06/20/14 1941 06/20/14 2215   06/20/14 1945  vancomycin (VANCOCIN) IVPB 1000 mg/200 mL premix  Status:  Discontinued     1,000 mg 200 mL/hr over 60 Minutes Intravenous  Once 06/20/14 1941 06/20/14 1954      Assessment: Vancomycin trough 23.9 ug/ml. Last dose ~2330 on the 11 and trough was drawn "early" tonight. Result delayed so level at this  time should be appx = to 20 mcg/ml based on current elimination curve.   Goal of Therapy:  Vancomycin trough level 15-20 mcg/ml  Plan:  Give 2 gm now and change next dose to 1750mg  at 0000 on the 13th.   Curlene Dolphin 06/25/2014,12:38  AM

## 2014-06-25 NOTE — Progress Notes (Signed)
PT Cancellation Note  Patient Details Name: Heather Zimmerman. Hemmer MRN: [id number] DOB: 13-Jun-1946   Cancelled Treatment:    Reason Eval/Treat Not Completed: Patient not medically ready Pt is intubated.  Will follow-up tomorrow.   Lorriane Shire 06/25/2014, 9:33 AM

## 2014-06-25 NOTE — Progress Notes (Signed)
PULMONARY / CRITICAL CARE MEDICINE   Name: Heather Zimmerman MRN: [id number] DOB: [birth year]    ADMISSION DATE:  06/20/2014 CONSULTATION DATE:  06/25/2014  REFERRING MD :  EDP  CHIEF COMPLAINT:  SOB, fever  INITIAL PRESENTATION: 68 y.o. F from nursing home brought to ED on 9/7 with SOB and fever.  In ED, found to have septic shock presumed from urosepsis and was hypoxic and placed on BiPAP.  Initial ABG showed chronic hypercarbia.  Repeat ABG after BiPAP worse, required intubation. PCCM was consulted.  STUDIES:  9/07 CXR: hypoinflation, increased density in upper right paratracheal region. 90/7 L tib/fib Xray:  no fracture 9/08 TTE: normal LVEF, grade I DD, PA pressures appeared normal  SIGNIFICANT EVENTS: 9/07 presented to ED with SOB and sepsis, found to have UTI.  Place on BiPAP but failed and required intubation. Started on vasopressors with dx of septic shock.  9/08 failed SBT. High PIPs. Flow curve with appearance of obstruction. Nebulized steroids and BDs started. Weaning off vasopressors. Cardiac markers negative 9/08 WOC consult: Bactroban to wound on LLE 9/09 Off vasopressors. Failed SBT but PIPs improved. Cognition intact 9/10 Failed SBT. Tolerated PS 15 cm H2O 9/11 Failed SBT. Thick mucus. Nebulized NAC initiated. Tolerated PS 10 cm H2O  LINES/TUBES: ETT 9/07 >>  L IJ CVL 9/07 >>   MICRO: MRSA PCR 9/07 >> POS Urine 9/07 >> > 100K multiple organisms L leg wound 9/07 >> few MRSA, few group B strep Blood 9/07 >>  PCT 9/08: 8.47,   9/09: 5.14,  9/10: 2.92  ANTIMICROBIALS: Cefepime 9/07 >> 9/11 Vanc 9/07 >>   SUBJECTIVE:  Failed sbt , high rate  VITAL SIGNS: Temp:  [93.1 F (33.9 C)-100.4 F (38 C)] 94.9 F (34.9 C) (09/12 1000) Pulse Rate:  [73-88] 80 (09/12 1000) Resp:  [13-29] 14 (09/12 1000) BP: (87-137)/(40-83) 115/58 mmHg (09/12 0810) SpO2:  [93 %-99 %] 96 % (09/12 1000) FiO2 (%):  [40 %] 40 % (09/12 0900) Weight:  [151.7 kg (334 lb 7 oz)] 151.7 kg (334  lb 7 oz) (09/12 0500) HEMODYNAMICS:   VENTILATOR SETTINGS: Vent Mode:  [-] PSV;CPAP FiO2 (%):  [40 %] 40 % Set Rate:  [15 bmp] 15 bmp Vt Set:  [450 mL] 450 mL PEEP:  [5 cmH20] 5 cmH20 Pressure Support:  [10 cmH20-15 cmH20] 15 cmH20 Plateau Pressure:  [23 cmH20-26 cmH20] 26 cmH20 INTAKE / OUTPUT: Intake/Output     09/11 0701 - 09/12 0700 09/12 0701 - 09/13 0700   I.V. (mL/kg) 225 (1.5)    Other     NG/GT 2745    IV Piggyback 2150    Total Intake(mL/kg) 5120 (33.8)    Urine (mL/kg/hr) 3280 (0.9)    Total Output 3280     Net +1840            PHYSICAL EXAMINATION: General: Oobese, RASS 0 to -1. + F/C Neuro: no focal deficits HEENT: [place]/AT. PERRL Cardiovascular: RRR, no M Lungs: distant, reduced further bases Abdomen: Obese, BS x 4, soft, NT/ND.  Ext: Right BKA.  LLE with chronic circumferential wound that appears clean  LABS: I have reviewed all of today's lab results. Relevant abnormalities are discussed in the A/P section  CXR: NNF   ASSESSMENT / PLAN:  PULMONARY A: Acute on chronic respiratory failure component of pulm edema OSA/OHS - non-compliant with nocturnal CPAP  Airflow obstruction evident on flow curves, improved P:   Cont full vent support - settings reviewed and/or adjusted Cont vent bundle  Daily SBT,. Attempts PS 15 required, if able to goal 12 for 4 hours Cont nebulized steroids Cont nebulized BDs Would prefer neg balance, avoid such 2 liters daily up pcxr further Last abg reviewed, keep same MV  CARDIOVASCULAR A:  Septic Shock, resolved Hx HTN Chronic diastolic heart failure P:  Goal MAP > 60 Dc cvp Holding outpatient losartan Lasix re initiation  RENAL A:   AKI, nonoliguric - improving Hypokalemia, resolved Hypernatremia P:   Dc free water Lasix re start Chem in am   GASTROINTESTINAL A:   Obesity GERD, chronic PPI use P:   SUP: enteral pantoprazole Cont TFs  follow last BM  HEMATOLOGIC A:   Mild anemia without acute  blood loss Borderline thrombocytopenia P:  DVT px: SQ heparin. Monitor CBC intermittently Transfuse per usual ICU guidelines  INFECTIOUS A:   Severe sepsis Mildly elevated PCT MRSA/Group B strep LLE cellulitis P:   Cefepime 9/10 vanc 9/10>>>9/12 Micro and abx as above PCt improving Dc vanc  ENDOCRINE A:   DM II, inadequate control P:   Cont SSI Add Lantus 9/10, increase of Lantus 9/12 if control still poor  NEUROLOGIC A:   Acute encephalopathy, resolved ICU associated discomfort P:   RASS goal: 0 to -1. Sedation: PRN fentanyl Daily WUA. Cont outpt gabapentin, sertraline.  Holding clonazepam, diphenhydramine, melatonin. uprioght as able Mobility as able  TODAY'S SUMMARY:   Diuresis maximzie, wean ps 15, narrow abx  I have personally obtained a history, examined the patient, evaluated laboratory and imaging results, formulated the assessment and plan and placed orders. CRITICAL CARE: The patient is critically ill with multiple organ systems failure and requires high complexity decision making for assessment and support, frequent evaluation and titration of therapies, application of advanced monitoring technologies and extensive interpretation of multiple databases. Critical Care Time devoted to patient care services described in this note is 30 minutes.   Lavon Paganini. Titus Mould, MD, [place] Pgr: [place] [facility]

## 2014-06-26 ENCOUNTER — Inpatient Hospital Stay (HOSPITAL_COMMUNITY): Payer: Medicare Other

## 2014-06-26 LAB — POCT I-STAT 3, ART BLOOD GAS (G3+)
Acid-Base Excess: 15 mmol/L — ABNORMAL HIGH (ref 0.0–2.0)
Bicarbonate: 41.3 mEq/L — ABNORMAL HIGH (ref 20.0–24.0)
O2 Saturation: 94 %
PH ART: 7.445 (ref 7.350–7.450)
TCO2: 43 mmol/L (ref 0–100)
pCO2 arterial: 60.5 mmHg (ref 35.0–45.0)
pO2, Arterial: 72 mmHg — ABNORMAL LOW (ref 80.0–100.0)

## 2014-06-26 LAB — CBC WITH DIFFERENTIAL/PLATELET
BASOS PCT: 0 % (ref 0–1)
Basophils Absolute: 0 10*3/uL (ref 0.0–0.1)
EOS PCT: 3 % (ref 0–5)
Eosinophils Absolute: 0.3 10*3/uL (ref 0.0–0.7)
HEMATOCRIT: 29.6 % — AB (ref 36.0–46.0)
HEMOGLOBIN: 9.2 g/dL — AB (ref 12.0–15.0)
Lymphocytes Relative: 17 % (ref 12–46)
Lymphs Abs: 1.4 10*3/uL (ref 0.7–4.0)
MCH: 28.5 pg (ref 26.0–34.0)
MCHC: 31.1 g/dL (ref 30.0–36.0)
MCV: 91.6 fL (ref 78.0–100.0)
MONO ABS: 0.7 10*3/uL (ref 0.1–1.0)
MONOS PCT: 9 % (ref 3–12)
NEUTROS ABS: 5.7 10*3/uL (ref 1.7–7.7)
Neutrophils Relative %: 71 % (ref 43–77)
Platelets: 172 10*3/uL (ref 150–400)
RBC: 3.23 MIL/uL — ABNORMAL LOW (ref 3.87–5.11)
RDW: 15.8 % — ABNORMAL HIGH (ref 11.5–15.5)
WBC: 8.1 10*3/uL (ref 4.0–10.5)

## 2014-06-26 LAB — COMPREHENSIVE METABOLIC PANEL
ALBUMIN: 2 g/dL — AB (ref 3.5–5.2)
ALK PHOS: 60 U/L (ref 39–117)
ALT: 19 U/L (ref 0–35)
AST: 22 U/L (ref 0–37)
Anion gap: 9 (ref 5–15)
BUN: 33 mg/dL — AB (ref 6–23)
CHLORIDE: 97 meq/L (ref 96–112)
CO2: 36 mEq/L — ABNORMAL HIGH (ref 19–32)
Calcium: 8.8 mg/dL (ref 8.4–10.5)
Creatinine, Ser: 1.42 mg/dL — ABNORMAL HIGH (ref 0.50–1.10)
GFR calc Af Amer: 43 mL/min — ABNORMAL LOW (ref >90)
GFR calc non Af Amer: 37 mL/min — ABNORMAL LOW (ref >90)
Glucose, Bld: 229 mg/dL — ABNORMAL HIGH (ref 70–99)
POTASSIUM: 3.7 meq/L (ref 3.7–5.3)
SODIUM: 142 meq/L (ref 137–147)
TOTAL PROTEIN: 6.9 g/dL (ref 6.0–8.3)
Total Bilirubin: 0.4 mg/dL (ref 0.3–1.2)

## 2014-06-26 LAB — GLUCOSE, CAPILLARY
Glucose-Capillary: 116 mg/dL — ABNORMAL HIGH (ref 70–99)
Glucose-Capillary: 144 mg/dL — ABNORMAL HIGH (ref 70–99)
Glucose-Capillary: 169 mg/dL — ABNORMAL HIGH (ref 70–99)
Glucose-Capillary: 195 mg/dL — ABNORMAL HIGH (ref 70–99)
Glucose-Capillary: 205 mg/dL — ABNORMAL HIGH (ref 70–99)
Glucose-Capillary: 235 mg/dL — ABNORMAL HIGH (ref 70–99)

## 2014-06-26 MED ORDER — PANTOPRAZOLE SODIUM 40 MG PO TBEC
40.0000 mg | DELAYED_RELEASE_TABLET | Freq: Every day | ORAL | Status: DC
  Filled 2014-06-26: qty 1

## 2014-06-26 MED ORDER — INSULIN GLARGINE 100 UNIT/ML ~~LOC~~ SOLN
20.0000 [IU] | Freq: Every day | SUBCUTANEOUS | Status: DC
  Administered 2014-06-26 – 2014-06-29 (×4): 20 [IU] via SUBCUTANEOUS
  Filled 2014-06-26 (×6): qty 0.2

## 2014-06-26 MED ORDER — FUROSEMIDE 10 MG/ML IJ SOLN
60.0000 mg | Freq: Two times a day (BID) | INTRAMUSCULAR | Status: AC
  Administered 2014-06-26 (×2): 60 mg via INTRAVENOUS
  Filled 2014-06-26 (×2): qty 6

## 2014-06-26 NOTE — Progress Notes (Signed)
PULMONARY / CRITICAL CARE MEDICINE   Name: Heather Zimmerman. Seher MRN: [id number] DOB: 1946-09-21    ADMISSION DATE:  06/20/2014 CONSULTATION DATE:  06/26/2014  REFERRING MD :  EDP  CHIEF COMPLAINT:  SOB, fever  INITIAL PRESENTATION: 68 y.o. F from nursing home brought to ED on 9/7 with SOB and fever.  In ED, found to have septic shock presumed from urosepsis and was hypoxic and placed on BiPAP.  Initial ABG showed chronic hypercarbia.  Repeat ABG after BiPAP worse, required intubation. PCCM was consulted.  STUDIES:  9/07 CXR: hypoinflation, increased density in upper right paratracheal region. 90/7 L tib/fib Xray:  no fracture 9/08 TTE: normal LVEF, grade I DD, PA pressures appeared normal  SIGNIFICANT EVENTS: 9/07 presented to ED with SOB and sepsis, found to have UTI.  Place on BiPAP but failed and required intubation. Started on vasopressors with dx of septic shock.  9/08 failed SBT. High PIPs. Flow curve with appearance of obstruction. Nebulized steroids and BDs started. Weaning off vasopressors. Cardiac markers negative 9/08 WOC consult: Bactroban to wound on LLE 9/09 Off vasopressors. Failed SBT but PIPs improved. Cognition intact 9/10 Failed SBT. Tolerated PS 15 cm H2O 9/11 Failed SBT. Thick mucus. Nebulized NAC initiated. Tolerated PS 10 cm H2O 9/12- neg 2.1 liters  LINES/TUBES: ETT 9/07 >>  L IJ CVL 9/07 >>   MICRO: MRSA PCR 9/07 >> POS Urine 9/07 >> > 100K multiple organisms L leg wound 9/07 >> few MRSA, few group B strep Blood 9/07 >>  PCT 9/08: 8.47,   9/09: 5.14,  9/10: 2.92  ANTIMICROBIALS: Cefepime 9/07 >> 9/11 Vanc 9/07 >>   SUBJECTIVE:  Neg balance successful  VITAL SIGNS: Temp:  [87.4 F (30.8 C)-100 F (37.8 C)] 99.1 F (37.3 C) (09/13 0800) Pulse Rate:  [62-87] 62 (09/13 0800) Resp:  [14-36] 15 (09/13 0800) BP: (92-124)/(40-71) 96/40 mmHg (09/13 0800) SpO2:  [92 %-100 %] 94 % (09/13 0800) FiO2 (%):  [40 %] 40 % (09/13 0800) Weight:  [149.5 kg (329  lb 9.4 oz)] 149.5 kg (329 lb 9.4 oz) (09/13 0452) HEMODYNAMICS:   VENTILATOR SETTINGS: Vent Mode:  [-] PRVC FiO2 (%):  [40 %] 40 % Set Rate:  [15 bmp] 15 bmp Vt Set:  [450 mL] 450 mL PEEP:  [5 cmH20] 5 cmH20 Pressure Support:  [15 cmH20] 15 cmH20 Plateau Pressure:  [23 cmH20-25 cmH20] 23 cmH20 INTAKE / OUTPUT: Intake/Output     09/12 0701 - 09/13 0700 09/13 0701 - 09/14 0700   I.V. (mL/kg) 60 (0.4)    Other 360    NG/GT 1140 60   IV Piggyback 550    Total Intake(mL/kg) 2110 (14.1) 60 (0.4)   Urine (mL/kg/hr) 4310 (1.2)    Total Output 4310     Net -2200 +60          PHYSICAL EXAMINATION: General: Oobese, RASS 0, follows commands well Neuro: no focal deficits HEENT: [place]/AT. PERRL Cardiovascular: RRR, no M Lungs: CTA Abdomen: Obese, BS x 4, soft, NT/ND.  Ext: Right BKA.  LLE with chronic circumferential wound that appears clean and unchanged  LABS: I have reviewed all of today's lab results. Relevant abnormalities are discussed in the A/P section  CXR: NNF   ASSESSMENT / PLAN:  PULMONARY A: Acute on chronic respiratory failure component of pulm edema OSA/OHS - non-compliant with nocturnal CPAP  Airflow obstruction evident on flow curves, improved P:   Cont full vent support - settings reviewed and/or adjusted Cont vent bundle Daily  SBT, PS attempts 5, would accept 5 cc/kg and rate 35-40 Cont nebulized steroids Cont nebulized BDs pcxr in am after diuresis noted, reduce   CARDIOVASCULAR A:  Septic Shock, resolved Hx HTN Chronic diastolic heart failure P:  Goal MAP > 60 Holding outpatient losartan, crt sligh tup Lasix reduce  RENAL A:   AKI, nonoliguric - improving overall Hypernatremia P:   Lasix reduce Chem in am  k supp on lasix bmet in am   GASTROINTESTINAL A:   Obesity GERD, chronic PPI use P:   SUP: enteral pantoprazole Cont TFs  follow last BM - noted  HEMATOLOGIC A:   Mild anemia without acute blood loss Borderline  thrombocytopenia P:  DVT px: SQ heparin. Avoid phlebotomy  INFECTIOUS A:   Severe sepsis Mildly elevated PCT MRSA/Group B strep LLE cellulitis P:   Cefepime 9/10 vanc 9/10>>> Establish stop date  Vanc, consider 10 with topical  ENDOCRINE A:   DM II, inadequate control but better P:   Cont SSI lantus increase to 20  NEUROLOGIC A:   Acute encephalopathy, resolved ICU associated discomfort P:   RASS goal: 0 to -1. Sedation: PRN fentanyl Daily WUA. Cont outpt gabapentin, sertraline.  Holding clonazepam, diphenhydramine, melatonin. uprioght as able Mobility as able  TODAY'S SUMMARY:   Reduce lasix, wean with goal TV 5 cc.kg, stop date vanc added, lantus in crease  I have personally obtained a history, examined the patient, evaluated laboratory and imaging results, formulated the assessment and plan and placed orders. CRITICAL CARE: The patient is critically ill with multiple organ systems failure and requires high complexity decision making for assessment and support, frequent evaluation and titration of therapies, application of advanced monitoring technologies and extensive interpretation of multiple databases. Critical Care Time devoted to patient care services described in this note is 30 minutes.   Lavon Paganini. Titus Mould, MD, [place] Pgr: [facility]

## 2014-06-26 NOTE — Progress Notes (Signed)
Called ABG to MD, per MD proceed to extubation.  RT can use bipap if needed post extubation.

## 2014-06-26 NOTE — Procedures (Signed)
Extubation Procedure Note  Patient Details:   Name: Heather Zimmerman. Barbone DOB: Jan 29, 1946 MRN: [id number]   Airway Documentation:     Evaluation  O2 sats: stable throughout Complications: No apparent complications Patient did tolerate procedure well. Bilateral Breath Sounds: Clear;Diminished Suctioning: Oral;Airway Yes, pt able to speak.  No stridor noted, no distress noted.   Lenna Sciara 06/26/2014, 1:01 PM

## 2014-06-27 ENCOUNTER — Inpatient Hospital Stay (HOSPITAL_COMMUNITY): Payer: Medicare Other

## 2014-06-27 LAB — GLUCOSE, CAPILLARY
Glucose-Capillary: 105 mg/dL — ABNORMAL HIGH (ref 70–99)
Glucose-Capillary: 127 mg/dL — ABNORMAL HIGH (ref 70–99)
Glucose-Capillary: 154 mg/dL — ABNORMAL HIGH (ref 70–99)
Glucose-Capillary: 159 mg/dL — ABNORMAL HIGH (ref 70–99)
Glucose-Capillary: 59 mg/dL — ABNORMAL LOW (ref 70–99)
Glucose-Capillary: 79 mg/dL (ref 70–99)

## 2014-06-27 LAB — CULTURE, BLOOD (ROUTINE X 2)
CULTURE: NO GROWTH
Culture: NO GROWTH

## 2014-06-27 MED ORDER — CETYLPYRIDINIUM CHLORIDE 0.05 % MT LIQD: 7.0000 mL | Freq: Two times a day (BID) | OROMUCOSAL | Status: DC

## 2014-06-27 MED ORDER — INSULIN ASPART 100 UNIT/ML ~~LOC~~ SOLN: 0.0000 [IU] | Freq: Every day | SUBCUTANEOUS | Status: DC

## 2014-06-27 MED ORDER — IPRATROPIUM-ALBUTEROL 0.5-2.5 (3) MG/3ML IN SOLN
3.0000 mL | Freq: Four times a day (QID) | RESPIRATORY_TRACT | Status: DC
  Administered 2014-06-27 – 2014-06-30 (×12): 3 mL via RESPIRATORY_TRACT
  Filled 2014-06-27 (×13): qty 3

## 2014-06-27 MED ORDER — CHLORHEXIDINE GLUCONATE 0.12 % MT SOLN: 15.0000 mL | Freq: Two times a day (BID) | OROMUCOSAL | Status: DC

## 2014-06-27 MED ORDER — CAMPHOR-MENTHOL 0.5-0.5 % EX LOTN
TOPICAL_LOTION | CUTANEOUS | Status: DC | PRN
  Administered 2014-06-27: 1 via TOPICAL
  Filled 2014-06-27: qty 222

## 2014-06-27 MED ORDER — POTASSIUM CHLORIDE CRYS ER 20 MEQ PO TBCR
40.0000 meq | EXTENDED_RELEASE_TABLET | Freq: Every day | ORAL | Status: DC
  Administered 2014-06-27 – 2014-06-30 (×4): 40 meq via ORAL
  Filled 2014-06-27 (×4): qty 2

## 2014-06-27 MED ORDER — TORSEMIDE 20 MG PO TABS
20.0000 mg | ORAL_TABLET | Freq: Every day | ORAL | Status: DC
  Administered 2014-06-27 – 2014-06-30 (×4): 20 mg via ORAL
  Filled 2014-06-27 (×6): qty 1

## 2014-06-27 MED ORDER — INSULIN ASPART 100 UNIT/ML ~~LOC~~ SOLN
0.0000 [IU] | Freq: Three times a day (TID) | SUBCUTANEOUS | Status: DC
  Administered 2014-06-27: 3 [IU] via SUBCUTANEOUS
  Administered 2014-06-27 – 2014-06-29 (×3): 4 [IU] via SUBCUTANEOUS
  Administered 2014-06-29 – 2014-06-30 (×2): 3 [IU] via SUBCUTANEOUS

## 2014-06-27 MED ORDER — GABAPENTIN 600 MG PO TABS
600.0000 mg | ORAL_TABLET | Freq: Two times a day (BID) | ORAL | Status: DC
  Administered 2014-06-27 – 2014-06-30 (×7): 600 mg via ORAL
  Filled 2014-06-27 (×8): qty 1

## 2014-06-27 MED ORDER — ASPIRIN 81 MG PO CHEW
81.0000 mg | CHEWABLE_TABLET | Freq: Every day | ORAL | Status: DC
  Administered 2014-06-27 – 2014-06-30 (×4): 81 mg via ORAL
  Filled 2014-06-27 (×4): qty 1

## 2014-06-27 MED ORDER — BUDESONIDE 0.5 MG/2ML IN SUSP
0.5000 mg | Freq: Two times a day (BID) | RESPIRATORY_TRACT | Status: DC
  Administered 2014-06-27 – 2014-06-30 (×6): 0.5 mg via RESPIRATORY_TRACT
  Filled 2014-06-27 (×9): qty 2

## 2014-06-27 MED ORDER — DIPHENHYDRAMINE HCL 50 MG/ML IJ SOLN
12.5000 mg | Freq: Four times a day (QID) | INTRAMUSCULAR | Status: DC | PRN
  Administered 2014-06-27 – 2014-06-30 (×4): 12.5 mg via INTRAVENOUS
  Filled 2014-06-27 (×4): qty 1

## 2014-06-27 MED ORDER — CETYLPYRIDINIUM CHLORIDE 0.05 % MT LIQD
7.0000 mL | Freq: Two times a day (BID) | OROMUCOSAL | Status: DC
  Administered 2014-06-27 – 2014-06-30 (×7): 7 mL via OROMUCOSAL

## 2014-06-27 MED ORDER — SERTRALINE HCL 50 MG PO TABS
50.0000 mg | ORAL_TABLET | Freq: Every day | ORAL | Status: DC
  Administered 2014-06-27 – 2014-06-29 (×3): 50 mg via ORAL
  Filled 2014-06-27 (×4): qty 1

## 2014-06-27 NOTE — Consult Note (Signed)
Patient Heather Zimmerman      DOB: [birth year]      EQA:[id number]     Consult Note from the Palliative Medicine Team at [facility] Requested by: Dr. 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 She subsequently ran out of funding for intensive rehab and has essentially been wheelchair bound due to her obesity and limited mobility without a prosthetic limb.  She reports being very distressed over her circumstances and reports that she doesn't feel the facility is taking good enough care of her.  We talked about her current hospitalization and shared with her my concern for the chronic nature of her illness.  We talked about planning ahead by having a POA and advanced directive.  At one point, she became tearful thinking that she would not get back to her former level of function. She agreed to let me talk more with her brother present so that we could address some of these issues.  At this time, she would like to maintain full code status and treat the treatable issues affecting her health.     Goals of Care: 1.  Code Status: full   2. Scope of Treatment: Treat the treatable, including infection , anemia, and cpap.   4. Disposition: to be determined.  Patient will need SNF placement but is not happy with her current arrangement.   3. Symptom Management:   1. Anxiety/Agitation: trying to avoid sedating medications that can exacerbate her OSA 2. Pain: agree with current oral regime, prn 3. Bowel Regimen: monitor   4. Psychosocial: Widowed , lost her only son.  SNF long term.  She has two sisters and a brother  56. Spiritual:        Patient Documents Completed or Given: Document Given Completed   Advanced Directives Pkt    MOST    DNR    Gone from My Sight    Hard Choices      Brief HPI: 68 yr old admitted with sepsis from UTi and respiratory failure.  Asked to assist with long term goals of care.   ROS: sore throat, no shortness of breath, nausea or vomiting.    PMH:  Past Medical History  Diagnosis Date  . 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 Essential hypertension, benign 12/31/2012  . Chronic diastolic heart failure 9/79/8921  . GERD 12/31/2012  . Unspecified vitamin D deficiency 12/31/2012  . Chronic pain 12/31/2012  . Venous stasis ulcers   . Venous insufficiency (chronic) (peripheral)      PSH: Past Surgical History  Procedure Laterality Date  . Leg amputation below knee Right 01/03/2012   I have reviewed the FH and SH and  If appropriate update it with new information. Allergies  Allergen Reactions  . Ace Inhibitors     unknown   Scheduled Meds: . antiseptic oral rinse  7 mL Mouth Rinse BID  . aspirin  81 mg Oral Daily  . budesonide (PULMICORT) nebulizer solution  0.5 mg Nebulization BID  . gabapentin  600 mg Oral BID  . heparin  5,000 Units Subcutaneous 3 times per day  . insulin aspart  0-20 Units Subcutaneous TID WC  . insulin aspart  0-5 Units Subcutaneous QHS  . insulin glargine  20 Units Subcutaneous QHS  . ipratropium-albuterol  3 mL Nebulization QID  . mupirocin ointment   Nasal BID  . potassium chloride SA  40 mEq Oral Daily  . sertraline  50 mg Oral QHS  . torsemide  20 mg Oral Daily   Continuous Infusions:  PRN Meds:.sodium chloride, acetaminophen (TYLENOL) oral liquid 160 mg/5 mL, albuterol, camphor-menthol, diphenhydrAMINE, lidocaine    BP 95/61  Pulse 70  Temp(Src) 98.8 F (37.1 C) (Oral)  Resp 22  Ht 5\' 3"  (1.6 m)  Wt 149.3 kg (329 lb 2.4 oz)  BMI 58.32 kg/m2  SpO2 97%   PPS 30%   Intake/Output Summary (Last 24 hours) at  06/27/14 1728 Last data filed at 06/27/14 0700  Gross per 24 hour  Intake    740 ml  Output   2025 ml  Net  -1285 ml    Physical Exam:  General: No acute distress, slightly hoarse, muffled speech due to short neck, and compact pharyngeal space HEENT: PERRL, EOMI, anciteric, mmm Chest:   Decreased and clear anteriorly CVS: regular, S1, S2 Abdomen:morbidly obese, not tender or distended Ext: right lower extremity amputated and well healed, hyperpigmented left lower extremity Neuro: awake ,alert oriented, with capacity for decision making. CN appear to be intact  Labs: CBC    Component Value Date/Time   WBC 8.1 06/26/2014 0500   WBC 5.0 02/25/2014   RBC 3.23* 06/26/2014 0500   HGB 9.2* 06/26/2014 0500   HCT 29.6* 06/26/2014 0500   PLT 172 06/26/2014 0500   MCV 91.6 06/26/2014 0500   MCH 28.5 06/26/2014 0500   MCHC 31.1 06/26/2014 0500   RDW 15.8* 06/26/2014 0500   LYMPHSABS 1.4 06/26/2014 0500   MONOABS 0.7 06/26/2014 0500   EOSABS 0.3 06/26/2014 0500   BASOSABS 0.0 06/26/2014 0500    BMET    Component Value Date/Time   NA 142 06/26/2014 0500   K 3.7 06/26/2014 0500   CL 97 06/26/2014 0500   CO2 36* 06/26/2014 0500   GLUCOSE 229* 06/26/2014 0500   BUN 33* 06/26/2014 0500   CREATININE 1.42* 06/26/2014 0500   CALCIUM 8.8 06/26/2014 0500   GFRNONAA 37* 06/26/2014 0500   GFRAA 43* 06/26/2014 0500    CMP     Component Value Date/Time   NA 142 06/26/2014 0500   K 3.7 06/26/2014 0500   CL 97 06/26/2014 0500   CO2 36* 06/26/2014 0500   GLUCOSE 229* 06/26/2014 0500   BUN 33* 06/26/2014 0500   CREATININE 1.42* 06/26/2014 0500   CALCIUM 8.8 06/26/2014 0500   PROT 6.9 06/26/2014 0500   ALBUMIN 2.0* 06/26/2014 0500   AST 22 06/26/2014 0500   ALT 19 06/26/2014 0500   ALKPHOS 60 06/26/2014 0500   BILITOT 0.4 06/26/2014 0500   GFRNONAA 37* 06/26/2014 0500   GFRAA 43* 06/26/2014 0500    Chest Xray Reviewed/Impressions: There has not been significant interval change in the appearance of  the chest  since the study of September 12th. Some improvement in the  presumed atelectasis at the left lung base has occurred. Mild  pulmonary interstitial edema persists.     Time In Time Out Total Time Spent with Patient Total Overall Time  330 pm 430 pm 60 min 60 min    Greater than 50%  of this time was spent counseling and coordinating care related to the above assessment and  plan.  Jossie Smoot L. Lovena Le, MD MBA The Palliative Medicine Team at [facility] Phone: [phone] Pager: [phone] ( Use team phone after hours)'

## 2014-06-27 NOTE — Consult Note (Signed)
Patient Heather Zimmerman      DOB: 07-15-1946      UDT:[id number]   Met with Inez Catalina. She has a wonderful bright spirit.  She lost her son and her husband and has been at [facility] for the last year and one half going on two years. She states when she went there she was hoping to be able to get more therapy so that she can get up and around and go out but she has manly been wheelchair bound.  She never prepared an advanced directive and she was hesitant to talk about the nature of her illness long term . She did commit to letting me call her brother to include in further decisions.  Her chronic obesity and OSA is not completely understood by her.  It doesn't sound as if she is using CPAP at the facility. She relates the the last few months has not met her expectations for the level of care that she would want and so she did report she may be open to looking at another facility at discharge.  She states that she would likely still want to be on vent if this were to happen again, but again I am not so sure she understands the nature of her illness.  We will talk more when her brother is present so that we could potentially complete an advanced directive and POA while he is here.    Full note to follow:  Recommend based on the patient's goals for now:  1.  Full code- started to talk about future wishes  2.  Treat the treatable- infection etc.  3.  SW please consider other facility options for her if she desires to make a change.    Total time.  330 pm- 430 pm   Havanah Nelms L. Lovena Le, MD MBA The Palliative Medicine Team at [facility] Phone: [phone] Pager: [phone] ( Use team phone after hours)

## 2014-06-27 NOTE — Progress Notes (Signed)
PT Cancellation Note  Patient Details Name: Heather Zimmerman. Labella MRN: [id number] DOB: 1946/02/01   Cancelled Treatment:    Reason Eval/Treat Not Completed: PT screened, no needs identified, will sign off (Pt using lift for transfers at nursing home.).   [place] 06/27/2014, 12:19 PM

## 2014-06-27 NOTE — Progress Notes (Signed)
NUTRITION FOLLOW UP  Intervention:   Continue CHO-modified diet, current intake is adequate to meet nutrition needs.  Nutrition Dx:   Inadequate oral intake related to inability to eat as evidenced by NPO status. Resolved.  Goal:   Intake to meet >90% of estimated nutrition needs. Met.  Monitor:   PO intake, labs, weight trend.  Assessment:   68 y.o. F from nursing home brought to ED on 9/7 with SOB and fever. In ED, found to have septic shock presumed from urosepsis and was hypoxic and placed on BiPAP. Initial ABG showed chronic hypercarbia. Repeat ABG after BiPAP worse, required intubation.  Patient was extubated on 9/13. Diet has been advanced to CHO-modified. Patient is consuming 100% of meals to meet nutrition needs. No supplements needed at this time.  Height: Ht Readings from Last 1 Encounters:  06/20/14 _0  (1.6 m)    Weight Status:   Wt Readings from Last 1 Encounters:  06/27/14 329 lb 2.4 oz (149.3 kg)    Re-estimated needs:  Kcal: 2000-2200 Protein: 105-120 gm Fluid: 2-2.2 L  Skin: left leg diabetic ulcer  Diet Order: Carb Control   Intake/Output Summary (Last 24 hours) at 06/27/14 1439 Last data filed at 06/27/14 0700  Gross per 24 hour  Intake    740 ml  Output   2625 ml  Net  -1885 ml    Last BM: 9/13   Labs:   Recent Labs Lab 06/20/14 2100 06/21/14 0500  06/23/14 0445 06/25/14 0522 06/26/14 0500  NA 144 145  < > 147 145 142  K 3.5* 3.6*  < > 3.8 4.1 3.7  CL 97 102  < > 106 103 97  CO2 37* 34*  < > 33* 35* 36*  BUN 49* 42*  < > 30* 30* 33*  CREATININE 1.65* 1.48*  < > 1.25* 1.28* 1.42*  CALCIUM 8.8 8.0*  < > 8.5 8.8 8.8  MG  --  2.0  --   --   --   --   PHOS  --  2.1*  --   --   --   --   GLUCOSE 189* 138*  < > 201* 245* 229*  < > = values in this interval not displayed.  CBG (last 3)   Recent Labs  06/27/14 0435 06/27/14 0732 06/27/14 1336  GLUCAP 79 59* 154*    Scheduled Meds: . antiseptic oral rinse  7 mL Mouth  Rinse BID  . aspirin  81 mg Oral Daily  . budesonide (PULMICORT) nebulizer solution  0.5 mg Nebulization BID  . gabapentin  600 mg Oral BID  . heparin  5,000 Units Subcutaneous 3 times per day  . insulin aspart  0-20 Units Subcutaneous TID WC  . insulin aspart  0-5 Units Subcutaneous QHS  . insulin glargine  20 Units Subcutaneous QHS  . ipratropium-albuterol  3 mL Nebulization QID  . mupirocin ointment   Nasal BID  . potassium chloride SA  40 mEq Oral Daily  . sertraline  50 mg Oral QHS  . torsemide  20 mg Oral Daily    Continuous Infusions:  None   Molli Barrows, RD, LDN, [place] Pager [phone] After Hours Pager [phone]

## 2014-06-27 NOTE — Progress Notes (Signed)
PULMONARY / CRITICAL CARE MEDICINE   Name: Heather Zimmerman MRN: [id number] DOB: 1945-11-06    ADMISSION DATE:  06/20/2014 CONSULTATION DATE:  06/27/2014  REFERRING MD :  EDP  CHIEF COMPLAINT:  SOB, fever  INITIAL PRESENTATION: 68 y.o. F from nursing home brought to ED on 9/7 with SOB and fever.  In ED, found to have septic shock presumed from urosepsis and was hypoxic and placed on BiPAP.  Initial ABG showed chronic hypercarbia.  Repeat ABG after BiPAP worse, required intubation. PCCM was consulted.  STUDIES:  9/07 CXR: hypoinflation, increased density in upper right paratracheal region. 90/7 L tib/fib Xray:  no fracture 9/08 TTE: normal LVEF, grade I DD, PA pressures appeared normal  SIGNIFICANT EVENTS: 9/07 presented to ED with SOB and sepsis, found to have UTI.  Place on BiPAP but failed and required intubation. Started on vasopressors with dx of septic shock.  9/08 failed SBT. High PIPs. Flow curve with appearance of obstruction. Nebulized steroids and BDs started. Weaning off vasopressors. Cardiac markers negative 9/08 WOC consult: Bactroban to wound on LLE 9/09 Off vasopressors. Failed SBT but PIPs improved. Cognition intact 9/10 Failed SBT. Tolerated PS 15 cm H2O 9/11 Failed SBT. Thick mucus. Nebulized NAC initiated. Tolerated PS 10 cm H2O 9/12- neg 2.1 liters 9/13 extubated 9/14 Transfer to med-surg floor. To remain on PCCM service pending decisions re: DC timing  LINES/TUBES: ETT 9/07 >> 9/13 L IJ CVL 9/07 >>   MICRO: MRSA PCR 9/07 >> POS Urine 9/07 >> > 100K multiple organisms L leg wound 9/07 >> few MRSA, few group B strep Blood 9/07 >> NEG PCT 9/08: 8.47,   9/09: 5.14,  9/10: 2.92  ANTIMICROBIALS: Cefepime 9/07 >> 9/11 Vanc 9/07 >> 9/14   SUBJECTIVE:  No distress, no new complaints  VITAL SIGNS: Temp:  [98 F (36.7 C)-99.4 F (37.4 C)] 98 F (36.7 C) (09/14 1340) Pulse Rate:  [58-143] 70 (09/14 1300) Resp:  [11-26] 22 (09/14 1300) BP: (90-119)/(42-72)  95/61 mmHg (09/14 1300) SpO2:  [84 %-100 %] 97 % (09/14 1300) Weight:  [149.3 kg (329 lb 2.4 oz)] 149.3 kg (329 lb 2.4 oz) (09/14 0600) HEMODYNAMICS:   VENTILATOR SETTINGS:   INTAKE / OUTPUT: Intake/Output     09/13 0701 - 09/14 0700 09/14 0701 - 09/15 0700   P.O. 240    I.V. (mL/kg)     Other     NG/GT 60    IV Piggyback 500    Total Intake(mL/kg) 800 (5.4)    Urine (mL/kg/hr) 3975 (1.1)    Total Output 3975     Net -3175            PHYSICAL EXAMINATION: General: NAD Neuro: no focal deficits HEENT: [place]/AT. PERRL Cardiovascular: RRR, no M Lungs: CTA Abdomen: Obese, BS x 4, soft, NT/ND.  Ext: Right BKA.  LLE chronic wound  LABS: I have reviewed all of today's lab results. Relevant abnormalities are discussed in the A/P section  CXR: CM, vasc cong   ASSESSMENT / PLAN:  PULMONARY A: Acute resp failure, resolved Chronic hypercarbic respiratory failure Component of pulm edema OSA/OHS - non-compliant with nocturnal CPAP  Airflow obstruction, resolved P:   Supp O2 as needed Cont nebulized BDs and steroids  CARDIOVASCULAR A:  Septic Shock, resolved Hx HTN, controlled Chronic diastolic heart failure P:  Holding outpatient losartan  RENAL A:   AKI, nonoliguric Probable CKD Hypernatremia, resolved Hypervolemia, improved P:   Monitor BMET intermittently Monitor I/Os Correct electrolytes as indicated  GASTROINTESTINAL A:   Obesity GERD, chronic PPI use P:   SUP: PO pantoprazole Cont carb mod diet  HEMATOLOGIC A:   Mild anemia without acute blood loss Borderline thrombocytopenia - resolved P:  DVT px: SQ heparin. Minimize phlebotomy  INFECTIOUS A:   Severe sepsis, resolved Mildly elevated PCT, resolved MRSA/Group B strep LLE cellulitis, much improved P:   Micro and abx as above DC vanc 9/14 Cont topical therapies  ENDOCRINE A:   DM II, controlled P:   Cont Lantus Change SSI to ACHS  NEUROLOGIC A:   Acute encephalopathy,  resolved ICU associated discomfort P:   RASS goal: 0. Sedation: PRN fentanyl Cont outpt gabapentin, sertraline.  Holding clonazepam, diphenhydramine, melatonin.   TODAY'S SUMMARY:   Transfer to med-surg. Begin DC planning   Merton Border, MD ; [facility] [phone].  After 5:30 PM or weekends, call [phone]

## 2014-06-27 NOTE — Progress Notes (Signed)
CARE MANAGEMENT NOTE 06/27/2014  Patient:  Heather Zimmerman, Heather Zimmerman   Account Number:  [id number]  Date Initiated:  06/21/2014  Documentation initiated by:  Luz Lex  Subjective/Objective Assessment:   Admitted with sepsis - UTI.Marland Kitchen   intubated and on pressors.     Action/Plan:   Anticipated DC Date:  06/28/2014   Anticipated DC Plan:  LONG TERM ACUTE CARE (LTAC)      DC Planning Services  CM consult      Choice offered to / List presented to:             Status of service:  Completed, signed off Medicare Important Message given?  YES (If response is "NO", the following Medicare IM given date fields will be blank) Date Medicare IM given:  06/24/2014 Medicare IM given by:  Elissa Hefty Date Additional Medicare IM given:   Additional Medicare IM given by:    Discharge Disposition:    Per UR Regulation:  Reviewed for med. necessity/level of care/duration of stay  If discussed at [place] of Stay Meetings, dates discussed:    Comments:  [id number]/Bobbi Kozakiewicz,RN,BSN,CCM: patient being transferred from [id number] to med-surg floor/dc planning to begin/palliative consult requested for goals of care and pending at time of review.  Contact:  Dillard,Willie Relative [phone]

## 2014-06-27 NOTE — Progress Notes (Signed)
Unable to get blood return and draw labs off CVC x 3 ports; all 3 ports are sluggish to flush, however was able to run IVAB in without difficulty during shift; Venipuncture notified and requested lab draw; IV team notified and requested assessment... Blair Hailey, RN

## 2014-06-28 DIAGNOSIS — Z515 Encounter for palliative care: Secondary | ICD-10-CM

## 2014-06-28 LAB — GLUCOSE, CAPILLARY
GLUCOSE-CAPILLARY: 89 mg/dL (ref 70–99)
GLUCOSE-CAPILLARY: 118 mg/dL — AB (ref 70–99)
GLUCOSE-CAPILLARY: 186 mg/dL — AB (ref 70–99)
Glucose-Capillary: 169 mg/dL — ABNORMAL HIGH (ref 70–99)

## 2014-06-28 LAB — BASIC METABOLIC PANEL
Anion gap: 13 (ref 5–15)
BUN: 36 mg/dL — AB (ref 6–23)
CALCIUM: 9 mg/dL (ref 8.4–10.5)
CO2: 36 mEq/L — ABNORMAL HIGH (ref 19–32)
Chloride: 97 mEq/L (ref 96–112)
Creatinine, Ser: 1.72 mg/dL — ABNORMAL HIGH (ref 0.50–1.10)
GFR calc Af Amer: 34 mL/min — ABNORMAL LOW (ref >90)
GFR, EST NON AFRICAN AMERICAN: 29 mL/min — AB (ref >90)
GLUCOSE: 91 mg/dL (ref 70–99)
Potassium: 3.9 mEq/L (ref 3.7–5.3)
Sodium: 146 mEq/L (ref 137–147)

## 2014-06-28 MED ORDER — LOSARTAN POTASSIUM 25 MG PO TABS
12.5000 mg | ORAL_TABLET | Freq: Every day | ORAL | Status: DC
  Administered 2014-06-28 – 2014-06-30 (×3): 12.5 mg via ORAL
  Filled 2014-06-28 (×3): qty 0.5

## 2014-06-28 NOTE — Progress Notes (Signed)
Clinical Social Work Department CLINICAL SOCIAL WORK PLACEMENT NOTE 06/28/2014  Patient:  Heather Zimmerman, Heather Zimmerman  Account Number:  [id number] Admit date:  06/20/2014  Clinical Social Worker:  Berton Mount, Latanya Presser  Date/time:  06/28/2014 04:00 PM  Clinical Social Work is seeking post-discharge placement for this patient at the following level of care:   [place]   (*CSW will update this form in Epic as items are completed)   06/28/2014  Patient/family provided with [place] Department of Clinical Social Work's list of facilities offering this level of care within the geographic area requested by the patient (or if unable, by the patient's family).  06/28/2014  Patient/family informed of their freedom to choose among providers that offer the needed level of care, that participate in Medicare, Medicaid or managed care program needed by the patient, have an available bed and are willing to accept the patient.  06/28/2014  Patient/family informed of [facility]' ownership interest in [facility], as well as of the fact that they are under no obligation to receive care at this facility.  PASARR submitted to EDS on Existing PASARR number received on   FL2 transmitted to all facilities in geographic area requested by pt/family on  06/28/2014 FL2 transmitted to all facilities within larger geographic area on   Patient informed that his/her managed care company has contracts with or will negotiate with  certain facilities, including the following:     Patient/family informed of bed offers received:   Patient chooses bed at  Physician recommends and patient chooses bed at    Patient to be transferred to  on   Patient to be transferred to facility by  Patient and family notified of transfer on  Name of family member notified:    The following physician request were entered in Epic: Physician Request  Please sign FL2.    Additional CommentsBerton Mount,  [place]

## 2014-06-28 NOTE — Progress Notes (Signed)
CSW (Clinical Education officer, museum) notified that pt and pt brother wanting to consider alternative facility choices. CSW visited pt and was informed by pt that she is not happy with current facility. Pt feels as though she is ignored and not cared for correctly by facility. Pt also had concerns about therapy she receives. CSW asked how long pt has been at facility and pt informed CSW she has been a resident since 2013. CSW explained that at this time, pt is likely using Medicaid benefits and that does not include coverage of therapy. CSW explained that this would be the case at any facility. Pt understanding of this and asked for CSW to still send referral to alternate facilities. CSW explained referral process and pt is agreeable to referral being sent to [facility] and [facility]. Pt and pt brother discussed multiple facilities that pt has previously been to and pt reported she was not sure she would want to dc to any discussed. CSW did advise pt that pt will have to make decision with available options when medically stable, and per MD plan is for dc tomorrow. Pt understanding of this.   [place], [place]

## 2014-06-28 NOTE — Progress Notes (Signed)
PULMONARY / CRITICAL CARE MEDICINE   Name: Heather Zimmerman. [place]alko MRN: [id number] DOB: October 31, 1945    ADMISSION DATE:  06/20/2014 CONSULTATION DATE:  06/28/2014  REFERRING MD :  EDP  CHIEF COMPLAINT:  SOB, fever  INITIAL PRESENTATION: 68 y.o. F from nursing home brought to ED on 9/7 with SOB and fever.  In ED, found to have septic shock presumed from urosepsis and was hypoxic and placed on BiPAP.  Initial ABG showed chronic hypercarbia.  Repeat ABG after BiPAP worse, required intubation. PCCM was consulted for admit.  She was admitted to ICU for septic shock requiring vasopressors.  Patient's initial chest xray demonstrated an increased density in RU paratracheal region.  Urine cultures were positive for greater than 100k multiple morphotypes.  Lower extremities were concerning for cellulitis and open wound on LLE.  Wound culture grew few MRSA and Group B Strep.  Blood cultures were negative.  She had a prolonged ICU course due to difficulty weaning from mechanical ventilation and vasopressor needs. The patient was weaned off vasopressors, diuresed and ultimately was liberated from mechanical ventilation on 9/13 to [place] O2. She was transferred to the medical floor on 9/14 and tolerated CPAP overnight.     STUDIES:  9/07  CXR: hypoinflation, increased density in upper right paratracheal region. 9/07  L tib/fib Xray:  no fracture 9/08  TTE: normal LVEF, grade I DD, PA pressures appeared normal  SIGNIFICANT EVENTS: 9/07  presented to ED with SOB and sepsis, found to have UTI.  Place on BiPAP but failed and required intubation. Started on vasopressors with dx of septic shock.  9/08  failed SBT. High PIPs. Flow curve with appearance of obstruction. Nebulized steroids and BDs started. Weaning off vasopressors. Cardiac markers negative 9/08  WOC consult: Bactroban to wound on LLE 9/09  Off vasopressors. Failed SBT but PIPs improved. Cognition intact 9/10  Failed SBT. Tolerated PS 15 cm H2O 9/11  Failed SBT.  Thick mucus. Nebulized NAC initiated. Tolerated PS 10 cm H2O 9/12  neg 2.1 liters 9/13  extubated 9/14  Transfer to med-surg floor. To remain on PCCM service pending decisions re: DC timing  LINES/TUBES: ETT 9/07 >> 9/13 L IJ CVL 9/07 >>   MICRO: MRSA PCR 9/07 >> POS Urine 9/07 >> > 100K multiple organisms L leg wound 9/07 >> few MRSA, few group B strep Blood 9/07 >> NEG PCT 9/08: 8.47,   9/09: 5.14,  9/10: 2.92  ANTIMICROBIALS: Cefepime 9/07 >> 9/11 Vanc 9/07 >> 9/14   SUBJECTIVE:  No distress, no new complaints.  Wore bipap overnight.    VITAL SIGNS: Temp:  [98 F (36.7 C)-98.8 F (37.1 C)] 98.4 F (36.9 C) (09/15 0600) Pulse Rate:  [64-86] 72 (09/15 0600) Resp:  [14-22] 20 (09/15 0600) BP: (82-118)/(32-71) 118/56 mmHg (09/15 0600) SpO2:  [90 %-99 %] 94 % (09/15 0818) Weight:  [320 lb 1.6 oz (145.196 kg)] 320 lb 1.6 oz (145.196 kg) (09/15 0600)  INTAKE / OUTPUT: Intake/Output     09/14 0701 - 09/15 0700 09/15 0701 - 09/16 0700   P.O.     NG/GT     IV Piggyback     Total Intake(mL/kg)     Urine (mL/kg/hr) 1275 (0.4)    Total Output 1275     Net -1275          Stool Occurrence 2 x      PHYSICAL EXAMINATION: General: NAD Neuro: no focal deficits HEENT: [place]/AT. PERRL Cardiovascular: RRR, no M Lungs: CTA Abdomen: Obese,  BS x 4, soft, NT/ND.  Ext: Right BKA.  LLE chronic wound  LABS: I have reviewed all of today's lab results. Relevant abnormalities are discussed in the A/P section  CXR: NNF   ASSESSMENT / PLAN:  PULMONARY A: Acute resp failure, resolved Chronic hypercarbic respiratory failure Component of pulm edema OSA/OHS - non-compliant with nocturnal CPAP, baseline PCO2 appears to be ~ 60 Airflow obstruction, resolved P:   Supp O2 as needed Cont nebulized BDs and steroids Nocturnal BiPAP 14/6, O2 bled in and 3 L O2 during day Continue PRN albuterol Budesonide, Duoneb Pulmonary hygiene   CARDIOVASCULAR A:  Septic Shock, resolved Hx  HTN, controlled Chronic diastolic heart failure P:  Resume outpatient losartan 9/15 Hold outpatient metolazone Continue demadex Continue ASA  RENAL A:   AKI, nonoliguric Probable CKD Hypernatremia, resolved Hypervolemia, improved P:   Monitor BMET intermittently Monitor I/Os Correct electrolytes as indicated  GASTROINTESTINAL A:   Obesity GERD, chronic PPI use P:   SUP: PO pantoprazole Cont carb mod diet  HEMATOLOGIC A:   Mild anemia without acute blood loss Borderline thrombocytopenia - resolved P:  DVT px: SQ heparin. Minimize phlebotomy  INFECTIOUS A:   Severe sepsis, resolved Mildly elevated PCT, resolved MRSA/Group B strep LLE cellulitis, much improved P:   Micro and abx as above Vanco d/c'd 9/14 Cont topical therapies  ENDOCRINE A:   DM II, controlled P:   Cont Lantus Change SSI to ACHS  NEUROLOGIC A:   Acute encephalopathy, resolved ICU associated discomfort P:   Cont outpt gabapentin, sertraline.  Holding clonazepam, diphenhydramine, melatonin - would not restart these with concerns for oversedation & OHS   TODAY'S SUMMARY:   Likely d/c in am back to SNF pending   Noe Gens, NP-C [facility] Pgr: [phone] or [phone]   PCCM ATTENDING: I have interviewed and examined the patient and reviewed the database. I have formulated the assessment and plan as reflected in the note above with amendments made by me.   Merton Border, MD;  PCCM service; Mobile [phone]

## 2014-06-28 NOTE — Progress Notes (Signed)
CSW (Clinical Education officer, museum) continues to follow. CSW notified facility of plan for dc tomorrow.  [place], [place]

## 2014-06-28 NOTE — Progress Notes (Signed)
Patient Heather Zimmerman      DOB: Feb 22, 1946      GBE:[id number]   Palliative Medicine Team at [facility] Progress Note    Subjective: Met with patient and her brother Izell [place] and spoke with the patient's sister via phone.  Patient continues to focus on not being well cared for and not receiving therapy at her current facility.  Brother willing to have her closer to him in [facility] so that he can advocate for her better.  Patient burst into tears at the thought of going back to the facility.  She is also struggling with understanding that her condition is not going to get much better and that she may be faced with cyclic illness which could put her at risk for reintubation and life threatening illness.  Patient states she desires continued treatment and resuscitation. She is going to try to be compliant with CPaP at night. She wants to be able to get out of her facility every now and then.  Patient willing to prepare an advanced directive and appoint her brother Dorena Dew.  I have contacted the chaplain to complete these documents while he is here. Patient half expressed that she would not want a trach if she could not talk with it, but was non committal on this issue.  We reviewed how a trach is used in severe sleep apnea.     Filed Vitals:   06/28/14 1449  BP: 103/62  Pulse: 72  Temp: 98.1 F (36.7 C)  Resp: 18   Physical exam:  General:voice slightly hoarse with muffled speech improved slightly since yesterday PERRL , EOMI anicteric mmm Chest : decreased with poor air entry. No wheezing. She does cough up intermittent thick mucous CVS: distant but sounds regular Abd: morbidly obese Ext: right AKA Neuro: awake, alert with full capacity for decision making although insight and judgement marginal regarding her chronic illness  Assessment and plan: 68 yr old admitted with respiratory failure, sepsis, UTI, and OSA.  Patient has poor insight into her current medical status but  has capacity for decision making.  Brother to help with possible new SNF and agrees to [facility] and helping her will advanced directive  1.  Full code at patient request. She is not as sure about long term trach use if that would be necessary.  2.  OSA: continue CPAP  3.  Fear about returning to current care setting- I have asked SW to consider new sites for her . Family willing to have her closer to [facility] so they can advocate for her.   4. Consult to chaplain to complete advanced directive and POA.    Total time 200 - 315 pm   Arminta Gamm L. Lovena Le, MD MBA The Palliative Medicine Team at [facility] Phone: [phone] Pager: [phone] ( Use team phone after hours)

## 2014-06-29 LAB — GLUCOSE, CAPILLARY
GLUCOSE-CAPILLARY: 160 mg/dL — AB (ref 70–99)
Glucose-Capillary: 110 mg/dL — ABNORMAL HIGH (ref 70–99)
Glucose-Capillary: 133 mg/dL — ABNORMAL HIGH (ref 70–99)
Glucose-Capillary: 131 mg/dL — ABNORMAL HIGH (ref 70–99)

## 2014-06-29 NOTE — Clinical Social Work Note (Signed)
Updated patients brother- Heather Zimmerman that at this point we have had no further SNF offers- he understands and is accepting of her going back to [facility] at time of dc if there are no further offers/options. Brother states he plans to come visit later today with other family members- will update and follow for SNF at dc. Eduard Clos, MSW, [place]

## 2014-06-29 NOTE — Progress Notes (Signed)
PULMONARY / CRITICAL CARE MEDICINE   Name: Heather Zimmerman. [place] MRN: [id number] DOB: 07-29-1946    ADMISSION DATE:  06/20/2014 CONSULTATION DATE:  06/29/2014  REFERRING MD :  EDP  CHIEF COMPLAINT:  SOB, fever  INITIAL PRESENTATION: 68 y.o. F from nursing home brought to ED on 9/7 with SOB and fever.  In ED, found to have septic shock presumed from urosepsis and was hypoxic and placed on BiPAP.  Initial ABG showed chronic hypercarbia.  Repeat ABG after BiPAP worse, required intubation. PCCM was consulted for admit.  She was admitted to ICU for septic shock requiring vasopressors.  Patient's initial chest xray demonstrated an increased density in RU paratracheal region.  Urine cultures were positive for greater than 100k multiple morphotypes.  Lower extremities were concerning for cellulitis and open wound on LLE.  Wound culture grew few MRSA and Group B Strep.  Blood cultures were negative.  She had a prolonged ICU course due to difficulty weaning from mechanical ventilation and vasopressor needs. The patient was weaned off vasopressors, diuresed and ultimately was liberated from mechanical ventilation on 9/13 to [place] O2. She was transferred to the medical floor on 9/14 and tolerated CPAP overnight.     STUDIES:  9/07  CXR: hypoinflation, increased density in upper right paratracheal region. 9/07  L tib/fib Xray:  no fracture 9/08  TTE: normal LVEF, grade I DD, PA pressures appeared normal  SIGNIFICANT EVENTS: 9/07  presented to ED with SOB and sepsis, found to have UTI.  Place on BiPAP but failed and required intubation. Started on vasopressors with dx of septic shock.  9/08  failed SBT. High PIPs. Flow curve with appearance of obstruction. Nebulized steroids and BDs started. Weaning off vasopressors. Cardiac markers negative 9/08  WOC consult: Bactroban to wound on LLE 9/09  Off vasopressors. Failed SBT but PIPs improved. Cognition intact 9/10  Failed SBT. Tolerated PS 15 cm H2O 9/11  Failed SBT.  Thick mucus. Nebulized NAC initiated. Tolerated PS 10 cm H2O 9/12  neg 2.1 liters 9/13  extubated 9/14  Transfer to med-surg floor. To remain on PCCM service pending decisions re: DC timing  LINES/TUBES: ETT 9/07 >> 9/13 L IJ CVL 9/07 >> out  MICRO: MRSA PCR 9/07 >> POS Urine 9/07 >> > 100K multiple organisms L leg wound 9/07 >> few MRSA, few group B strep Blood 9/07 >> NEG PCT 9/08: 8.47,   9/09: 5.14,  9/10: 2.92  ANTIMICROBIALS: Cefepime 9/07 >> 9/11 Vanc 9/07 >> 9/14   SUBJECTIVE:  No distress, no new complaints.  Wants to go to different SNF at d/c. Did not tol bipap well overnight.   VITAL SIGNS: Temp:  [98.1 F (36.7 C)-98.7 F (37.1 C)] 98.7 F (37.1 C) (09/16 0620) Pulse Rate:  [69-72] 69 (09/16 0620) Resp:  [18-20] 18 (09/16 0620) BP: (103-121)/(57-62) 121/62 mmHg (09/16 0620) SpO2:  [94 %-98 %] 98 % (09/16 0620) Weight:  [314 lb 4.8 oz (142.566 kg)] 314 lb 4.8 oz (142.566 kg) (09/16 0500)  INTAKE / OUTPUT: Intake/Output     09/15 0701 - 09/16 0700 09/16 0701 - 09/17 0700   P.O. 360    Total Intake(mL/kg) 360 (2.5)    Urine (mL/kg/hr) 2100 (0.6)    Total Output 2100     Net -1740          Urine Occurrence 1 x 1 x   Stool Occurrence  1 x     PHYSICAL EXAMINATION: General: NAD Neuro: no focal deficits HEENT: [place]/AT. PERRL  Cardiovascular: RRR, no M Lungs: CTA Abdomen: Obese, BS x 4, soft, NT/ND.  Ext: Right BKA.  LLE chronic wound  LABS: I have reviewed all of today's lab results. Relevant abnormalities are discussed in the A/P section  CXR: NNF   ASSESSMENT / PLAN:  PULMONARY A: Acute resp failure, resolved Chronic hypercarbic respiratory failure Component of pulm edema OSA/OHS - non-compliant with nocturnal CPAP, baseline PCO2 appears to be ~ 60 Airflow obstruction, resolved P:   Supp O2 as needed Cont nebulized BDs and steroids Cont attempts at nocturnal BiPAP 14/6, O2 bled in and 3 L O2 during day Continue PRN albuterol Budesonide,  Duoneb Pulmonary hygiene   CARDIOVASCULAR A:  Septic Shock, resolved Hx HTN, controlled Chronic diastolic heart failure P:  Cont outpatient losartan 9/15 Hold outpatient metolazone Continue demadex Continue ASA   RENAL A:   AKI, nonoliguric Probable CKD Hypernatremia, resolved Hypervolemia, improved P:   Monitor BMET intermittently - f/u 9/17 Monitor I/Os Correct electrolytes as indicated ?hold demadex   GASTROINTESTINAL A:   Obesity GERD, chronic PPI use P:   SUP: PO pantoprazole Cont carb mod diet  HEMATOLOGIC A:   Mild anemia without acute blood loss Borderline thrombocytopenia - resolved P:  DVT px: SQ heparin. Minimize phlebotomy  INFECTIOUS A:   Severe sepsis, resolved Mildly elevated PCT, resolved MRSA/Group B strep LLE cellulitis, much improved P:   Micro and abx as above - s/p 7 day course vanc  Vanco d/c'd 9/14 Cont topical therapies  ENDOCRINE A:   DM II, controlled P:   Cont Lantus Change SSI to ACHS  NEUROLOGIC A:   Acute encephalopathy, resolved ICU associated discomfort P:   Cont outpt gabapentin, sertraline.  Holding clonazepam, diphenhydramine, melatonin - would not restart these with concerns for oversedation & OHS   TODAY'S SUMMARY:   Ready for d/c back to SNF when a new facility is decided.    Heather Madrid, NP 06/29/2014  10:25 AM Pager: [phone] or [phone]  *Care during the described time interval was provided by me and/or other providers on the critical care team. I have reviewed this patient's available data, including medical history, events of note, physical examination and test results as part of my evaluation.   Merton Border, MD ; [facility] [phone].  After 5:30 PM or weekends, call [phone]

## 2014-06-29 NOTE — Discharge Summary (Signed)
Physician Discharge Summary  Patient ID: Heather Zimmerman. Tarpley MRN: [id number] DOB/AGE: 1945/12/29 68 y.o.  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) Cellulitis  HTN Chronic Diastolic CHF PVD s/p R BKA (2013) AKI Hypernatremia  Hypervolemia  Obesity  GERD Anemia  Mild Thromboctyopenia  Acute Encephalopathy Chronic Pain   Depression                                                                         DISCHARGE PLAN BY DIAGNOSIS    Acute Respiratory Failure Chronic Hypercarbic Respiratory Failure Pulmonary Edema OSA / OSH Paratracheal density - noted on CXR  Discharge Plan: -Continue nocturnal BiPAP 14/6 with 32L O2 bled in & PRN sleep  -Continue Duoneb Q6 PRN  -Pulmonary hygiene  -Avoid sedating medications as able, given morbid obesity / OSA, OHS -will need follow up chest xray to ensure paratracheal density is resolved, recommend follow up in one month  Septic Shock  MRSA / Group B Strep of LLE Cellulitis  PVD s/p R BKA (2013)  Discharge Plan: -Completed abx while inpatient  -Monitor LE's closely -Keep LE's clean & dry, elevate LE's  -Wound care:  Apply bactroban QD for moist healing  HTN Chronic Diastolic CHF  Discharge Plan: -Continue losartan, ASA, demadex  -hold metolazone, consider restart as outpatient  AKI Hypernatremia  Hypervolemia  Pruritis - likely related to CKD  Discharge Plan: -avoid nephrotoxic agents -follow up BMP in one week to assess serum creatinine -continue sarna lotion, benadryl PRN   Obesity  GERD  Discharge Plan: -continue PPI -Carbohydrate modified, low sodium heart healthy diet  Anemia - mild, in setting of chronic disease Mild Thromboctyopenia - resolved  Discharge Plan: -PRN CBC  Acute Encephalopathy Chronic Pain   Depression   Discharge Plan: -Resolved, no acute follow up at this time -Continue Neurotin, Zoloft  -HCPOA prepared with brother to be her advocate   Goals of Care -Full code at patients request -She is not sure she would want a trach if it came to that point.                   DISCHARGE SUMMARY   Heather Zimmerman. Plair is a 67 y.o. y/o female with a PMH of DM II, HTN, Chronic Diastolic CHF, Chronic Pain, PVD and resides in a SNF who was brought to the [facility] ER on 9/7 with SOB and fever. In ED, found to have septic shock presumed from urosepsis and was hypoxic and placed on BiPAP. Initial ABG showed chronic hypercarbia. Repeat ABG after BiPAP was worse and she required intubation. PCCM was consulted for admit. She was admitted to ICU for septic shock requiring vasopressors. Patient's initial chest xray demonstrated an increased density in RU paratracheal region. Urine cultures were positive for greater than 100k multiple morphotypes. Lower extremities were concerning for cellulitis and open wound on LLE. WOC RN was consulted and recommended to keep LE's clean / dry & apply Bactroban daily for moist healing. Wound culture grew few MRSA and Group B Strep. Blood cultures were negative. She  had a prolonged ICU course due to difficulty weaning from mechanical ventilation and vasopressor needs. The patient was weaned off vasopressors 9/9, diuresed and ultimately was liberated from mechanical ventilation on 9/13 to [place] O2. She was transferred to the medical floor on 9/14 and tolerated nocturnal BiPAP 14/6 with 2L O2 bled in for sats >90%.  Patient tolerated Bipap well, oxygen was weaned off during day time, she made slow improvement & is medically cleared for discharge back to SNF on 9/17.     SIGNIFICANT EVENTS:  9/07 presented to ED with SOB and sepsis, found to have UTI. Place on BiPAP but failed and required intubation. Started on vasopressors with dx of septic shock.  9/08 failed SBT. High PIPs.  Flow curve with appearance of obstruction. Nebulized steroids and BDs started. Weaning off vasopressors. Cardiac markers negative  9/08 WOC consult: Bactroban to wound on LLE  9/09 Off vasopressors. Failed SBT but PIPs improved. Cognition intact  9/10 Failed SBT. Tolerated PS 15 cm H2O  9/11 Failed SBT. Thick mucus. Nebulized NAC initiated. Tolerated PS 10 cm H2O  9/12 neg 2.1 liters  9/13 extubated  9/14 Transfer to med-surg floor.  9/16  Wore bipap without difficulty  LINES/TUBES:  ETT 9/07 >> 9/13  L IJ CVL 9/07 >>   MICRO:  MRSA PCR 9/07 >> POS  Urine 9/07 >> > 100K multiple organisms  L leg wound 9/07 >> few MRSA, few group B strep  Blood 9/07 >> NEG  PCT 9/08: 8.47, 9/09: 5.14, 9/10: 2.92   ANTIMICROBIALS:  Cefepime 9/07 >> 9/11  Vanc 9/07 >> 9/14     Discharge Exam: General: NAD  Neuro: no focal deficits, MAE HEENT: [place]/AT. PERRL  Cardiovascular: RRR, no M  Lungs: resp's even/non-labored, lungs bilaterally CTA  Abdomen: Obese, BS x 4, soft, NT/ND.  Ext: Right BKA. LLE chronic wound   Filed Vitals:   06/29/14 2250 06/30/14 0019 06/30/14 0156 06/30/14 0525  BP: 92/40   116/49  Pulse: 65 72  60  Temp: 98.6 F (37 C)   98.8 F (37.1 C)  TempSrc: Oral   Oral  Resp: $Remo'18 18  18  'SWsZW$ Height:      Weight:   316 lb 9.3 oz (143.6 kg)   SpO2: 99% 97%  100%     Discharge Labs  BMET  Recent Labs Lab 06/25/14 0522 06/26/14 0500 06/28/14 0651  NA 145 142 146  K 4.1 3.7 3.9  CL 103 97 97  CO2 35* 36* 36*  GLUCOSE 245* 229* 91  BUN 30* 33* 36*  CREATININE 1.28* 1.42* 1.72*  CALCIUM 8.8 8.8 9.0    CBC  Recent Labs Lab 06/25/14 0522 06/26/14 0500  HGB 9.0* 9.2*  HCT 29.6* 29.6*  WBC 6.8 8.1  PLT 145* 172      Medication List    STOP taking these medications       clonazePAM 0.5 MG tablet  Commonly known as:  KLONOPIN     Melatonin 3 MG Tabs     metolazone 5 MG tablet  Commonly known as:  ZAROXOLYN     nystatin-triamcinolone cream  Commonly  known as:  MYCOLOG II     traMADol 50 MG tablet  Commonly known as:  ULTRAM     TYLENOL EXTRA STRENGTH PO  Replaced by:  acetaminophen 160 MG/5ML solution     ZINC OXIDE EX      TAKE these medications       acetaminophen 160 MG/5ML solution  Commonly known as:  TYLENOL  Place 20.3 mLs (650 mg total) into feeding tube every 6 (six) hours as needed for fever.     aluminum & magnesium hydroxide-simethicone 500-450-40 MG/5ML suspension  Commonly known as:  MYLANTA  Take 30 mLs by mouth every 4 (four) hours as needed for indigestion.     aspirin 81 MG tablet  Take 81 mg by mouth daily.     camphor-menthol lotion  Commonly known as:  SARNA  Apply topically as needed for itching.     cholecalciferol 1000 UNITS tablet  Commonly known as:  VITAMIN D  Take 2,000 Units by mouth daily.     diphenhydrAMINE 25 MG tablet  Commonly known as:  SOMINEX  Take 25 mg by mouth every 12 (twelve) hours as needed for itching or sleep.     FLONASE NA  Place 1 spray into the nose daily as needed. For nasal congestion     gabapentin 600 MG tablet  Commonly known as:  NEURONTIN  Take 600 mg by mouth 2 (two) times daily.     insulin aspart 100 UNIT/ML injection  Commonly known as:  novoLOG  Inject 5 Units into the skin 3 (three) times daily before meals. 5 units daily before meals for cbg > or = to 150     ipratropium-albuterol 0.5-2.5 (3) MG/3ML Soln  Commonly known as:  DUONEB  Take 3 mLs by nebulization every 6 (six) hours as needed (shortness of breath).     LANTUS SOLOSTAR 100 UNIT/ML Solostar Pen  Generic drug:  Insulin Glargine  Inject 18 Units into the skin at bedtime.     lidocaine 2 % solution  Commonly known as:  XYLOCAINE  Use as directed 15 mLs in the mouth or throat every 6 (six) hours as needed for mouth pain.     losartan 25 MG tablet  Commonly known as:  COZAAR  Take 12.5 mg by mouth daily.     magnesium oxide 400 MG tablet  Commonly known as:  MAG-OX  Take 400 mg  by mouth every 8 (eight) hours.     mupirocin ointment 2 %  Commonly known as:  BACTROBAN  Place into the nose 2 (two) times daily. Also apply Bactroban to left leg open wounds Q day (Do not send more, already at bedside)     omeprazole 20 MG capsule  Commonly known as:  PRILOSEC  Take 20 mg by mouth daily.     potassium chloride SA 20 MEQ tablet  Commonly known as:  K-DUR,KLOR-CON  Take 20 mEq by mouth daily.     sertraline 50 MG tablet  Commonly known as:  ZOLOFT  Take 50 mg by mouth daily.     torsemide 20 MG tablet  Commonly known as:  DEMADEX  Take 20 mg by mouth daily.        Follow-up Information   Schedule an appointment as soon as possible for a visit with REED, TIFFANY, DO. (As needed for follow up at [facility])    Specialty:  Geriatric Medicine   Contact information:   [address] [place] [phone]        Disposition: SNF, [name]  Discharged Condition: Heather Zimmerman. Warman has met maximum benefit of inpatient care and is medically stable and cleared for discharge.  Patient is pending follow up as above.      Time spent on disposition:  Greater than 35 minutes.   Signed: Noe Gens, NP-C [facility] Pgr: [phone] Office: [phone]    Merton Border, MD ; [facility] [id number].  After 5:30 PM or weekends, call [phone]

## 2014-06-30 DIAGNOSIS — E1165 Type 2 diabetes mellitus with hyperglycemia: Secondary | ICD-10-CM | POA: Diagnosis not present

## 2014-06-30 DIAGNOSIS — J9610 Chronic respiratory failure, unspecified whether with hypoxia or hypercapnia: Secondary | ICD-10-CM | POA: Diagnosis not present

## 2014-06-30 DIAGNOSIS — E1142 Type 2 diabetes mellitus with diabetic polyneuropathy: Secondary | ICD-10-CM

## 2014-06-30 DIAGNOSIS — G4733 Obstructive sleep apnea (adult) (pediatric): Secondary | ICD-10-CM | POA: Diagnosis not present

## 2014-06-30 DIAGNOSIS — K219 Gastro-esophageal reflux disease without esophagitis: Secondary | ICD-10-CM | POA: Diagnosis not present

## 2014-06-30 DIAGNOSIS — D6949 Other primary thrombocytopenia: Secondary | ICD-10-CM | POA: Diagnosis not present

## 2014-06-30 DIAGNOSIS — I5032 Chronic diastolic (congestive) heart failure: Secondary | ICD-10-CM | POA: Diagnosis not present

## 2014-06-30 DIAGNOSIS — Z23 Encounter for immunization: Secondary | ICD-10-CM | POA: Diagnosis not present

## 2014-06-30 DIAGNOSIS — R0602 Shortness of breath: Secondary | ICD-10-CM | POA: Diagnosis not present

## 2014-06-30 DIAGNOSIS — G8929 Other chronic pain: Secondary | ICD-10-CM | POA: Diagnosis not present

## 2014-06-30 DIAGNOSIS — M79675 Pain in left toe(s): Secondary | ICD-10-CM | POA: Diagnosis not present

## 2014-06-30 DIAGNOSIS — Z5189 Encounter for other specified aftercare: Secondary | ICD-10-CM

## 2014-06-30 DIAGNOSIS — E1149 Type 2 diabetes mellitus with other diabetic neurological complication: Secondary | ICD-10-CM | POA: Diagnosis not present

## 2014-06-30 DIAGNOSIS — R6520 Severe sepsis without septic shock: Secondary | ICD-10-CM | POA: Diagnosis not present

## 2014-06-30 DIAGNOSIS — R635 Abnormal weight gain: Secondary | ICD-10-CM | POA: Diagnosis not present

## 2014-06-30 DIAGNOSIS — I7389 Other specified peripheral vascular diseases: Secondary | ICD-10-CM | POA: Diagnosis not present

## 2014-06-30 DIAGNOSIS — J988 Other specified respiratory disorders: Secondary | ICD-10-CM | POA: Diagnosis not present

## 2014-06-30 DIAGNOSIS — B9562 Methicillin resistant Staphylococcus aureus infection as the cause of diseases classified elsewhere: Secondary | ICD-10-CM | POA: Diagnosis not present

## 2014-06-30 DIAGNOSIS — L089 Local infection of the skin and subcutaneous tissue, unspecified: Secondary | ICD-10-CM

## 2014-06-30 DIAGNOSIS — Z794 Long term (current) use of insulin: Secondary | ICD-10-CM | POA: Diagnosis not present

## 2014-06-30 DIAGNOSIS — R6521 Severe sepsis with septic shock: Secondary | ICD-10-CM | POA: Diagnosis not present

## 2014-06-30 DIAGNOSIS — B351 Tinea unguium: Secondary | ICD-10-CM | POA: Diagnosis not present

## 2014-06-30 DIAGNOSIS — D649 Anemia, unspecified: Secondary | ICD-10-CM | POA: Diagnosis not present

## 2014-06-30 DIAGNOSIS — J9620 Acute and chronic respiratory failure, unspecified whether with hypoxia or hypercapnia: Secondary | ICD-10-CM | POA: Diagnosis not present

## 2014-06-30 DIAGNOSIS — I5033 Acute on chronic diastolic (congestive) heart failure: Secondary | ICD-10-CM | POA: Diagnosis not present

## 2014-06-30 DIAGNOSIS — E1140 Type 2 diabetes mellitus with diabetic neuropathy, unspecified: Secondary | ICD-10-CM | POA: Diagnosis not present

## 2014-06-30 DIAGNOSIS — F329 Major depressive disorder, single episode, unspecified: Secondary | ICD-10-CM | POA: Diagnosis not present

## 2014-06-30 DIAGNOSIS — F419 Anxiety disorder, unspecified: Secondary | ICD-10-CM | POA: Diagnosis not present

## 2014-06-30 DIAGNOSIS — E119 Type 2 diabetes mellitus without complications: Secondary | ICD-10-CM | POA: Diagnosis not present

## 2014-06-30 DIAGNOSIS — I10 Essential (primary) hypertension: Secondary | ICD-10-CM | POA: Diagnosis not present

## 2014-06-30 DIAGNOSIS — J189 Pneumonia, unspecified organism: Secondary | ICD-10-CM | POA: Diagnosis not present

## 2014-06-30 DIAGNOSIS — T148XXA Other injury of unspecified body region, initial encounter: Secondary | ICD-10-CM

## 2014-06-30 DIAGNOSIS — N179 Acute kidney failure, unspecified: Secondary | ICD-10-CM | POA: Diagnosis not present

## 2014-06-30 DIAGNOSIS — F411 Generalized anxiety disorder: Secondary | ICD-10-CM | POA: Diagnosis not present

## 2014-06-30 DIAGNOSIS — I5030 Unspecified diastolic (congestive) heart failure: Secondary | ICD-10-CM | POA: Diagnosis not present

## 2014-06-30 DIAGNOSIS — A419 Sepsis, unspecified organism: Secondary | ICD-10-CM | POA: Diagnosis not present

## 2014-06-30 DIAGNOSIS — J810 Acute pulmonary edema: Secondary | ICD-10-CM | POA: Diagnosis not present

## 2014-06-30 DIAGNOSIS — F3289 Other specified depressive episodes: Secondary | ICD-10-CM | POA: Diagnosis not present

## 2014-06-30 DIAGNOSIS — Z89519 Acquired absence of unspecified leg below knee: Secondary | ICD-10-CM | POA: Diagnosis not present

## 2014-06-30 DIAGNOSIS — J9600 Acute respiratory failure, unspecified whether with hypoxia or hypercapnia: Secondary | ICD-10-CM | POA: Diagnosis not present

## 2014-06-30 DIAGNOSIS — M79674 Pain in right toe(s): Secondary | ICD-10-CM | POA: Diagnosis not present

## 2014-06-30 DIAGNOSIS — G9340 Encephalopathy, unspecified: Secondary | ICD-10-CM | POA: Diagnosis not present

## 2014-06-30 DIAGNOSIS — G4700 Insomnia, unspecified: Secondary | ICD-10-CM | POA: Diagnosis not present

## 2014-06-30 DIAGNOSIS — S81809A Unspecified open wound, unspecified lower leg, initial encounter: Secondary | ICD-10-CM | POA: Diagnosis not present

## 2014-06-30 DIAGNOSIS — E1159 Type 2 diabetes mellitus with other circulatory complications: Secondary | ICD-10-CM | POA: Diagnosis not present

## 2014-06-30 LAB — GLUCOSE, CAPILLARY
Glucose-Capillary: 115 mg/dL — ABNORMAL HIGH (ref 70–99)
Glucose-Capillary: 133 mg/dL — ABNORMAL HIGH (ref 70–99)

## 2014-06-30 MED ORDER — MUPIROCIN 2 % EX OINT: TOPICAL_OINTMENT | Freq: Two times a day (BID) | CUTANEOUS | Status: DC

## 2014-06-30 MED ORDER — CAMPHOR-MENTHOL 0.5-0.5 % EX LOTN: TOPICAL_LOTION | CUTANEOUS | Status: DC | PRN

## 2014-06-30 MED ORDER — LIDOCAINE VISCOUS 2 % MT SOLN: 15.0000 mL | Freq: Four times a day (QID) | OROMUCOSAL | Status: DC | PRN

## 2014-06-30 MED ORDER — IPRATROPIUM-ALBUTEROL 0.5-2.5 (3) MG/3ML IN SOLN: 3.0000 mL | Freq: Four times a day (QID) | RESPIRATORY_TRACT | Status: AC | PRN

## 2014-06-30 MED ORDER — ACETAMINOPHEN 160 MG/5ML PO SOLN: 650.0000 mg | Freq: Four times a day (QID) | ORAL | Status: DC | PRN

## 2014-06-30 NOTE — Progress Notes (Signed)
Patient placed on BiPAP 14/6 via nasal mask with 3 Lof O2 bled in through circuit.  Patient is tolerating the BiPAP at this time.  RT will continue to monitor.   06/30/14 0019  BiPAP/CPAP/SIPAP  BiPAP/CPAP/SIPAP Pt Type Adult  Mask Type Nasal mask  Mask Size Medium  Respiratory Rate 18 breaths/min  IPAP 14 cmH20  EPAP 6 cmH2O  Flow Rate 3 lpm  BiPAP/CPAP/SIPAP BiPAP  Patient Home Equipment No  Auto Titrate No  BiPAP/CPAP /SiPAP Vitals  Pulse Rate 72  Resp 18  SpO2 97 %  Bilateral Breath Sounds Diminished

## 2014-06-30 NOTE — Clinical Social Work Note (Signed)
Family and patient have decided to go back to [facility] today and consier moving to another SNF closer to family after they have had time to look at local options including possible bed closer to family in [facility]. Plan transfer back to SNF via [place], MSW, [place]

## 2014-06-30 NOTE — Progress Notes (Signed)
Called report to [facility] earlier. Pt ready to be transferred. SW set up transfer. Marcelo Baldy D 4:00 PM 06/30/2014

## 2014-07-04 DIAGNOSIS — F3289 Other specified depressive episodes: Secondary | ICD-10-CM | POA: Diagnosis not present

## 2014-07-04 DIAGNOSIS — G4700 Insomnia, unspecified: Secondary | ICD-10-CM | POA: Diagnosis not present

## 2014-07-04 DIAGNOSIS — F329 Major depressive disorder, single episode, unspecified: Secondary | ICD-10-CM | POA: Diagnosis not present

## 2014-07-04 DIAGNOSIS — F411 Generalized anxiety disorder: Secondary | ICD-10-CM | POA: Diagnosis not present

## 2014-07-05 ENCOUNTER — Non-Acute Institutional Stay (SKILLED_NURSING_FACILITY): Payer: Medicare Other | Admitting: Internal Medicine

## 2014-07-05 ENCOUNTER — Encounter: Payer: Self-pay | Admitting: Internal Medicine

## 2014-07-05 DIAGNOSIS — I10 Essential (primary) hypertension: Secondary | ICD-10-CM

## 2014-07-05 DIAGNOSIS — G4733 Obstructive sleep apnea (adult) (pediatric): Secondary | ICD-10-CM | POA: Diagnosis not present

## 2014-07-05 DIAGNOSIS — F3289 Other specified depressive episodes: Secondary | ICD-10-CM | POA: Diagnosis not present

## 2014-07-05 DIAGNOSIS — E1159 Type 2 diabetes mellitus with other circulatory complications: Secondary | ICD-10-CM | POA: Diagnosis not present

## 2014-07-05 DIAGNOSIS — I5033 Acute on chronic diastolic (congestive) heart failure: Secondary | ICD-10-CM

## 2014-07-05 DIAGNOSIS — A419 Sepsis, unspecified organism: Secondary | ICD-10-CM

## 2014-07-05 DIAGNOSIS — F329 Major depressive disorder, single episode, unspecified: Secondary | ICD-10-CM | POA: Diagnosis not present

## 2014-07-05 DIAGNOSIS — R6520 Severe sepsis without septic shock: Secondary | ICD-10-CM

## 2014-07-05 DIAGNOSIS — J9622 Acute and chronic respiratory failure with hypercapnia: Secondary | ICD-10-CM

## 2014-07-05 DIAGNOSIS — J9620 Acute and chronic respiratory failure, unspecified whether with hypoxia or hypercapnia: Secondary | ICD-10-CM | POA: Diagnosis not present

## 2014-07-05 DIAGNOSIS — R6521 Severe sepsis with septic shock: Secondary | ICD-10-CM

## 2014-07-05 DIAGNOSIS — I509 Heart failure, unspecified: Secondary | ICD-10-CM

## 2014-07-05 HISTORY — DX: Obstructive sleep apnea (adult) (pediatric): G47.33

## 2014-07-05 NOTE — Progress Notes (Signed)
Patient ID: Heather Zimmerman. Gatling, female   DOB: July 04, 1946, 68 y.o.   MRN: [id number]  Provider:  Rexene Edison. Mariea Clonts, D.O., C.M.D. Location:  [facility] SN[facility]  PCP: Hollace Kinnier, DO  Code Status: full code;  HCPOA was prepared in the hospital with her brother; pt not sure if she would want a trach if it was necessary  Allergies  Allergen Reactions  . Ace Inhibitors     unknown    Chief Complaint  Patient presents with  . Readmit To SNF    HPI: 68 y.o. female obese black female long term care resident was readmitted here after acute hospitalization 9/7-17 for acute respiratory failure due to septic shock.  She was found to have MRSA and group B strep cellulitis of her LLE and treated with abx.  She also has chronic pulmonary edema, OSA and OSH and has never been compliant with BiPAP here.  She also had a paratracheal density on her chest xray.  She was sent here on noctural bipap 14/6 with 2-3 liter bleed-in and prn sleep.  Her metolazone was held at the hospital and upon discharge, but may need to be restarted if her weight trends up or she becomes more dyspneic or edematous.  She needs a f/u CXR in 1 mo to reassess the paratracheal density seen during hospitalization.  ROS: ROS  Past Medical History  Diagnosis Date  . 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 Essential hypertension, benign 12/31/2012  . Chronic diastolic heart failure 12/05/9796  . GERD 12/31/2012  . Unspecified vitamin D deficiency 12/31/2012  . Chronic pain 12/31/2012  . Venous stasis ulcers   . Venous insufficiency (chronic) (peripheral)    Past Surgical History  Procedure Laterality Date  . Leg amputation below knee Right 01/03/2012   Social History:   reports that she has never smoked. She does not have any smokeless tobacco history on file. She reports that she does not drink alcohol. Her drug history is not on  file.  No family history on file.  Medications: Patient's Medications  New Prescriptions   No medications on file  Previous Medications   ACETAMINOPHEN (TYLENOL) 160 MG/5ML SOLUTION    Place 20.3 mLs (650 mg total) into feeding tube every 6 (six) hours as needed for fever.   ALUMINUM & MAGNESIUM HYDROXIDE-SIMETHICONE (MYLANTA) 500-450-40 MG/5ML SUSPENSION    Take 30 mLs by mouth every 4 (four) hours as needed for indigestion.   ASPIRIN 81 MG TABLET    Take 81 mg by mouth daily.   CAMPHOR-MENTHOL (SARNA) LOTION    Apply topically as needed for itching.   CHOLECALCIFEROL (VITAMIN D) 1000 UNITS TABLET    Take 2,000 Units by mouth daily.    DIPHENHYDRAMINE (SOMINEX) 25 MG TABLET    Take 25 mg by mouth every 12 (twelve) hours as needed for itching or sleep.   FLUTICASONE PROPIONATE (FLONASE NA)    Place 1 spray into the nose daily as needed. For nasal congestion   GABAPENTIN (NEURONTIN) 600 MG TABLET    Take 600 mg by mouth 2 (two) times daily.   INSULIN ASPART (NOVOLOG) 100 UNIT/ML INJECTION    Inject 5 Units into the skin 3 (three) times daily before meals. 5 units daily before meals for cbg > or = to 150   INSULIN GLARGINE (LANTUS SOLOSTAR) 100 UNIT/ML SOLOSTAR PEN    Inject 18 Units into the skin at bedtime.  IPRATROPIUM-ALBUTEROL (DUONEB) 0.5-2.5 (3) MG/3ML SOLN    Take 3 mLs by nebulization every 6 (six) hours as needed (shortness of breath).   LIDOCAINE (XYLOCAINE) 2 % SOLUTION    Use as directed 15 mLs in the mouth or throat every 6 (six) hours as needed for mouth pain.   LOSARTAN (COZAAR) 25 MG TABLET    Take 12.5 mg by mouth daily.    MAGNESIUM OXIDE (MAG-OX) 400 MG TABLET    Take 400 mg by mouth every 8 (eight) hours.    MUPIROCIN OINTMENT (BACTROBAN) 2 %    Place into the nose 2 (two) times daily. Also apply Bactroban to left leg open wounds Q day (Do not send more, already at bedside)   OMEPRAZOLE (PRILOSEC) 20 MG CAPSULE    Take 20 mg by mouth daily.   POTASSIUM CHLORIDE SA  (K-DUR,KLOR-CON) 20 MEQ TABLET    Take 20 mEq by mouth daily.   SERTRALINE (ZOLOFT) 50 MG TABLET    Take 50 mg by mouth daily.   TORSEMIDE (DEMADEX) 20 MG TABLET    Take 20 mg by mouth daily.  Modified Medications   No medications on file  Discontinued Medications   No medications on file     Physical Exam: Filed Vitals:   07/05/14 1001  BP: 110/60  Pulse: 78  Temp: 97.6 F (36.4 C)  Resp: 18  Height: 5\' 2"  (1.575 m)  Weight: 320 lb (145.151 kg)  SpO2: 93%   Physical Exam  Labs reviewed: Basic Metabolic Panel:  Recent Labs  06/20/14 2100 06/21/14 0500  06/25/14 0522 06/26/14 0500 06/28/14 0651  NA 144 145  < > 145 142 146  K 3.5* 3.6*  < > 4.1 3.7 3.9  CL 97 102  < > 103 97 97  CO2 37* 34*  < > 35* 36* 36*  GLUCOSE 189* 138*  < > 245* 229* 91  BUN 49* 42*  < > 30* 33* 36*  CREATININE 1.65* 1.48*  < > 1.28* 1.42* 1.72*  CALCIUM 8.8 8.0*  < > 8.8 8.8 9.0  MG  --  2.0  --   --   --   --   PHOS  --  2.1*  --   --   --   --   < > = values in this interval not displayed. Liver Function Tests:  Recent Labs  06/20/14 2100 06/22/14 0340 06/26/14 0500  AST 20 28 22   ALT 15 25 19   ALKPHOS 68 68 60  BILITOT 0.6 0.4 0.4  PROT 7.3 7.3 6.9  ALBUMIN 2.4* 2.2* 2.0*   No results found for this basename: LIPASE, AMYLASE,  in the last 8760 hours No results found for this basename: AMMONIA,  in the last 8760 hours CBC:  Recent Labs  02/25/14 06/20/14 2100  06/23/14 0445 06/25/14 0522 06/26/14 0500  WBC 5.0 16.5*  < > 8.5 6.8 8.1  NEUTROABS  --  15.1*  --   --   --  5.7  HGB 11.3* 11.3*  < > 10.2* 9.0* 9.2*  HCT 36 36.2  < > 33.7* 29.6* 29.6*  MCV  --  91.2  < > 94.4 92.2 91.6  PLT 192 184  < > 147* 145* 172  < > = values in this interval not displayed. Cardiac Enzymes:  Recent Labs  06/21/14 0100 06/21/14 1215  TROPONINI <0.30 <0.30   BNP: No components found with this basename: POCBNP,  CBG:  Recent Labs  06/29/14 2249  06/30/14 0745  06/30/14 1205  GLUCAP 131* 115* 133*    Imaging and Procedures: pCXR 06/20/14:  Hypoinflation with mild prominence of the left perihilar markings which may be due to the degree of hypoinflation versus mild asymmetric vascular congestion.  Increased density in the upper right peritracheal region as cannot exclude a pulmonary parenchymal process versus mediastinal abnormality. Recommend PA and lateral chest x-ray for better evaluation versus CT scan if this abnormality persists.  Left tib/fib xrays 06/20/14:  No acute osseous abn found  pCXR 06/27/14:  There has not been significant interval change in the appearance of the chest since the study of September 12th. Some improvement in the  presumed atelectasis at the left lung base has occurred. Mild  pulmonary interstitial edema persists.  Assessment/Plan 1. Acute on chronic respiratory failure with hypercapnia -improved -needs f/u cxr in 1 mo for paratracheal density in 1 mo  2. Acute on chronic diastolic CHF (congestive heart failure) -weekly weights to monitor if needs metolazone restarted in addition to torsemide -extensively discussed adherence with sodium and fluid restrictions, foods to avoid  3. Type II or unspecified type diabetes mellitus with peripheral circulatory disorders, not stated as uncontrolled(250.70) -discussed dietary changes, is on concho nas diet, but eats a lot of snacks that are not part of it -cont lantus with novolog meal coverage -cont gabapentin for neuropathic pain -cont arb and asa and should be on statin therapy--needs lipid panel done  -foot and eye exams should be done here at facility--need dates documented  4. Septic shock -resolved, I guess this was due to her cellulitis?  Not clearly indicated -abx completed Monitor appearance of legs--she says they look much better as do the nurses  5. Morbid obesity -the brunt of all of her problems, but she is nonadherent with diet and does almost no exercise  (has bka on one side)  6. Depressive disorder, not elsewhere classified -cont zoloft for this--consider changing to a medication that would not cause weight gain  7. Obstructive sleep apnea -cont bipap at hs and with naps (she does not like to adhere to this either despite counseling)  8. Essential hypertension, benign -bp at goal with losartan, demadex diuretic  Functional status:  Needs assist with bathing, dressing, transfers due to weight and bka  Family/ staff Communication: seen with unit supervisor  Labs/tests ordered: f/u cxr in 1 mo, weekly weights to see if diuretic must be restarted (metolazone)

## 2014-07-29 ENCOUNTER — Non-Acute Institutional Stay (SKILLED_NURSING_FACILITY): Payer: Medicare Other | Admitting: Internal Medicine

## 2014-07-29 DIAGNOSIS — E1165 Type 2 diabetes mellitus with hyperglycemia: Secondary | ICD-10-CM | POA: Diagnosis not present

## 2014-07-29 DIAGNOSIS — R635 Abnormal weight gain: Secondary | ICD-10-CM

## 2014-07-29 DIAGNOSIS — IMO0002 Reserved for concepts with insufficient information to code with codable children: Secondary | ICD-10-CM

## 2014-07-29 DIAGNOSIS — E1140 Type 2 diabetes mellitus with diabetic neuropathy, unspecified: Secondary | ICD-10-CM | POA: Diagnosis not present

## 2014-07-29 DIAGNOSIS — I5032 Chronic diastolic (congestive) heart failure: Secondary | ICD-10-CM | POA: Diagnosis not present

## 2014-07-29 NOTE — Progress Notes (Signed)
Patient ID: Heather Zimmerman. [name], female   DOB: April 26, 1946, 68 y.o.   MRN: [id number]    Facility: [facility]  Chief Complaint  Patient presents with  . Acute Visit    weight gain of 9 lbs since beginning of october 2015   Allergies  Allergen Reactions  . Ace Inhibitors     unknown   HPI 68 y/o female patient is seen today for concerns of weight gain. She is morbidly obese, wheelchair bound patient with hx of dm, HTN, CHF among others. She weighed 321 lbs  07/15/14 and weighs 330 lbs today. She denies new worsening dyspnea or chest pain. She is non compliant with her diet. She is currently on lantus 18 u wth SSI novolog for cbg > 150 and torsemide 20 mg daily.  ROS Denies fever or chills cbg on review is mostly above 100 with range 81-240. a1c 9 in July 2015 and 7.7 in April 2015. Denies nausea or vomiting Denies polydipsia and polyuria Denies chest pain and palpitations Denies headache  Past Medical History  Diagnosis Date  . 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 Essential hypertension, benign 12/31/2012  . Chronic diastolic heart failure 1/69/6789  . GERD 12/31/2012  . Unspecified vitamin D deficiency 12/31/2012  . Chronic pain 12/31/2012  . Venous stasis ulcers   . Venous insufficiency (chronic) (peripheral)    Medication reviewed. See [facility]  Physical exam BP 122/66  Pulse 86  Temp(Src) 97.9 F (36.6 C)  Resp 16  Ht 5\' 2"  (1.575 m)  Wt 330 lb (149.687 kg)  BMI 60.34 kg/m2  SpO2 98%  General: elderly morbidly obese female in no acute distress HEENT: normocephalic, atraumatic, no pallor or icterus Cardiovascular: normal s1,s2, no murmur Lungs: CTAB, no wheeze or rhonchi or crackles Neck: no cervical lymphadenopathy Abdomen: Obese, bowel sound present, non tender Ext: Right BKA, on wheelchair Neuro: aao x 3  Assessment/plan  Weight gain Has hx of DM and  morbid obesity. Her non compliance with diet could have worsened her dm contributing to weight gain. Will check a1c, monitor cbg for now. Also rule out thyroid disorder and check tsh. With her hx of chf, will rule out fluid overload by checking pro-BNP. Daily weight check for now for 2 weeks. Dietary compliance counselled upon  Dm type 2 Uncontrolled, recheck a1c and adjust lantus dosing, continue lantus 18 u with SSI novolog for now  chf Monitor daily weight, check probnp, continue torsemideand losartan with kcl for now

## 2014-08-01 LAB — BASIC METABOLIC PANEL
BUN: 21 mg/dL (ref 4–21)
Creatinine: 1.3 mg/dL — AB (ref 0.5–1.1)
Glucose: 124 mg/dL
POTASSIUM: 4 mmol/L (ref 3.4–5.3)
SODIUM: 143 mmol/L (ref 137–147)

## 2014-08-01 LAB — TSH: TSH: 2.35 u[IU]/mL (ref 0.41–5.90)

## 2014-08-01 LAB — HEMOGLOBIN A1C: Hgb A1c MFr Bld: 6.7 % — AB (ref 4.0–6.0)

## 2014-08-01 LAB — HEPATIC FUNCTION PANEL
ALT: 11 U/L (ref 7–35)
AST: 16 U/L (ref 13–35)
Alkaline Phosphatase: 66 U/L (ref 25–125)

## 2014-08-04 ENCOUNTER — Non-Acute Institutional Stay (SKILLED_NURSING_FACILITY): Payer: Medicare Other | Admitting: Internal Medicine

## 2014-08-04 ENCOUNTER — Encounter: Payer: Self-pay | Admitting: Internal Medicine

## 2014-08-04 DIAGNOSIS — I10 Essential (primary) hypertension: Secondary | ICD-10-CM | POA: Diagnosis not present

## 2014-08-04 DIAGNOSIS — E1165 Type 2 diabetes mellitus with hyperglycemia: Secondary | ICD-10-CM

## 2014-08-04 DIAGNOSIS — F329 Major depressive disorder, single episode, unspecified: Secondary | ICD-10-CM

## 2014-08-04 DIAGNOSIS — F32A Depression, unspecified: Secondary | ICD-10-CM

## 2014-08-04 DIAGNOSIS — E1140 Type 2 diabetes mellitus with diabetic neuropathy, unspecified: Secondary | ICD-10-CM | POA: Diagnosis not present

## 2014-08-04 DIAGNOSIS — IMO0002 Reserved for concepts with insufficient information to code with codable children: Secondary | ICD-10-CM

## 2014-08-04 DIAGNOSIS — I5032 Chronic diastolic (congestive) heart failure: Secondary | ICD-10-CM | POA: Diagnosis not present

## 2014-08-04 DIAGNOSIS — J302 Other seasonal allergic rhinitis: Secondary | ICD-10-CM

## 2014-08-04 NOTE — Progress Notes (Signed)
Patient ID: Heather Zimmerman. Ciaravino, female   DOB: 01-12-1946, 68 y.o.   MRN: [id number]   Place of Service: [facility]  Allergies  Allergen Reactions  . Ace Inhibitors     unknown    Code Status: Full Code Goals of Care: Longevity/Long term care  Chief Complaint  Patient presents with  . Medical Management of Chronic Issues    morbid obesity, DM2, chronic diastolic HF, depression    HPI 68 y.o. female with PMH of morbid obesity, DM2, chronic diastolic HF, depression among others is being seen for a routine visit. No falls or skin issues reported. No change in appetite or bowel habits. Weight has been stable within the past week. No concerns from staff.   Review of Systems Constitutional: Negative for fever, chills, and fatigue. HENT: Negative for facial swelling, ear pain,and sore throat. Positive for congestion  Eyes: Negative for eye pain, eye discharge, and visual disturbance  Cardiovascular: Negative for chest pain, palpitations, and leg swelling Respiratory: Negative cough and wheezing. Positive for [facility] Gastrointestinal: Negative for nausea and vomiting. Negative for abdominal pain, diarrhea and constipation.  Genitourinary: Negative for  dysuria, frequency, urgency, and hematuria Endocrine: Negative for polydipsia, polyphagia, and polyuria Musculoskeletal: Negative for back pain, joint pain, and joint swelling  Neurological: Negative for dizziness, headache, weakness, and tremors.  Skin: Negative for rash and wound.  Positive for itching Psychiatric: Negative for nervous/anxious, agitation, depression, and suicidal ideas.   Past Medical History  Diagnosis Date  . 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 Essential hypertension, benign 12/31/2012  . Chronic diastolic heart failure 8/85/0277  . GERD 12/31/2012  . Unspecified vitamin D deficiency 12/31/2012  . Chronic  pain 12/31/2012  . Venous stasis ulcers   . Venous insufficiency (chronic) (peripheral)     Past Surgical History  Procedure Laterality Date  . 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 Marital Status: Widowed    Spouse Name: N/A    Number of Children: N/A  . Years of Education: N/A   Occupational History  . Not on file.   Social History Main Topics  . Smoking status: Never Smoker   . Smokeless tobacco: Not on file  . Alcohol Use: No  . Drug Use: Not on file  . Sexual Activity: Not on file   Other Topics Concern  . Not on file   Social History Narrative  . No narrative on file      Medication List       This list is accurate as of: 08/04/14 11:07 AM.  Always use your most recent med list.               acetaminophen 160 MG/5ML solution  Commonly known as:  TYLENOL  Place 20.3 mLs (650 mg total) into feeding tube every 6 (six) hours as needed for fever.     aluminum & magnesium hydroxide-simethicone 500-450-40 MG/5ML suspension  Commonly known as:  MYLANTA  Take 30 mLs by mouth every 4 (four) hours as needed for indigestion.     aspirin 81 MG tablet  Take 81 mg by mouth daily.     camphor-menthol lotion  Commonly known as:  SARNA  Apply topically as needed for itching.     cholecalciferol 1000 UNITS tablet  Commonly known as:  VITAMIN D  Take 2,000 Units by mouth daily.     diphenhydrAMINE  25 MG tablet  Commonly known as:  SOMINEX  Take 25 mg by mouth every 12 (twelve) hours as needed for itching or sleep.     FLONASE NA  Place 1 spray into the nose daily as needed. For nasal congestion     gabapentin 600 MG tablet  Commonly known as:  NEURONTIN  Take 600 mg by mouth 2 (two) times daily.     insulin aspart 100 UNIT/ML injection  Commonly known as:  novoLOG  Inject 5 Units into the skin 3 (three) times daily before meals. 5 units daily before meals for cbg > or = to 150     ipratropium-albuterol 0.5-2.5 (3)  MG/3ML Soln  Commonly known as:  DUONEB  Take 3 mLs by nebulization every 6 (six) hours as needed (shortness of breath).     LANTUS SOLOSTAR 100 UNIT/ML Solostar Pen  Generic drug:  Insulin Glargine  Inject 18 Units into the skin at bedtime.     lidocaine 2 % solution  Commonly known as:  XYLOCAINE  Use as directed 15 mLs in the mouth or throat every 6 (six) hours as needed for mouth pain.     losartan 25 MG tablet  Commonly known as:  COZAAR  Take 12.5 mg by mouth daily.     magnesium oxide 400 MG tablet  Commonly known as:  MAG-OX  Take 400 mg by mouth every 8 (eight) hours.     mupirocin ointment 2 %  Commonly known as:  BACTROBAN  Place into the nose 2 (two) times daily. Also apply Bactroban to left leg open wounds Q day (Do not send more, already at bedside)     omeprazole 20 MG capsule  Commonly known as:  PRILOSEC  Take 20 mg by mouth daily.     potassium chloride SA 20 MEQ tablet  Commonly known as:  K-DUR,KLOR-CON  Take 20 mEq by mouth daily.     sertraline 50 MG tablet  Commonly known as:  ZOLOFT  Take 50 mg by mouth daily.     torsemide 20 MG tablet  Commonly known as:  DEMADEX  Take 20 mg by mouth daily.        Physical Exam Filed Vitals:   08/04/14 1101  BP: 123/62  Pulse: 87  Temp: 96.4 F (35.8 C)  Resp: 16   Constitutional: obese elderly female in no acute distress.  HEENT: Normocephalic and atraumatic. PERRL. EOM intact. No icterus. No nasal discharge or sinus tenderness. Oral mucosa moist. Posterior pharynx clear of any exudate or lesions.  Neck: Supple and nontender. No lymphadenopathy, masses, or thyromegaly. No JVD or carotid bruits. Cardiac: Normal S1, S2. RRR without appreciable murmurs, rubs, or gallops. Intact R pedal pulse. No dependent edema.  Lungs: No respiratory distress. Breath sounds clear bilaterally without rales, rhonchi, or wheezes. Abdomen: Audible bowel sounds in all quadrants. Soft, nontender, nondistended. No palpable  mass.  Musculoskeletal:  No joint erythema or tenderness. R BKA Spine and Back: Normal spinal profile. No scoliosis or kyphosis. No tenderness over spines. No CVA tenderness.  Skin: Warm and dry. No rash noted. No erythema.  Neurological: Alert and oriented to person, place, and time. No focal deficits.  Psychiatric: Judgment and insight adequate. Appropriate mood and affect.   Labs Reviewed CBC Latest Ref Rng 06/26/2014 06/25/2014 06/23/2014  WBC 4.0 - 10.5 K/uL 8.1 6.8 8.5  Hemoglobin 12.0 - 15.0 g/dL 9.2(L) 9.0(L) 10.2(L)  Hematocrit 36.0 - 46.0 % 29.6(L) 29.6(L) 33.7(L)  Platelets 150 -  400 K/uL 172 145(L) 147(L)    CMP     Component Value Date/Time   NA 143 08/01/2014   NA 146 06/28/2014 0651   K 4.0 08/01/2014   CL 97 06/28/2014 0651   CO2 36* 06/28/2014 0651   GLUCOSE 91 06/28/2014 0651   BUN 21 08/01/2014   BUN 36* 06/28/2014 0651   CREATININE 1.3* 08/01/2014   CREATININE 1.72* 06/28/2014 0651   CALCIUM 9.0 06/28/2014 0651   PROT 6.9 06/26/2014 0500   ALBUMIN 2.0* 06/26/2014 0500   AST 16 08/01/2014   ALT 11 08/01/2014   ALKPHOS 66 08/01/2014   BILITOT 0.4 06/26/2014 0500   GFRNONAA 29* 06/28/2014 0651   GFRAA 34* 06/28/2014 0651    Lipid Panel     Component Value Date/Time   TRIG 157* 06/21/2014 0249    Lab Results  Component Value Date   HGBA1C 6.7* 08/01/2014    Assessment & Plan 1. Chronic diastolic heart failure No s&s of volume overload. Encourage using SABA PRN for dyspnea. Continue torsemide 20mg  daily and monitor and KCl supplement.   2. Essential hypertension, benign Stable. Continue losartan 12.5mg  daily and monitor.   3. DM type 2, uncontrolled, with neuropathy Last a1C 6.7 on 08/01/14. Continue lantus 18 units SQ daily at bedtime and novolog 5 units 3 times daily before meals for cbg>150. Continue gabapentin 600mg  twice daily for neuropathy and monitor.   4. Depression Mood stable. Continue sertraline 50mg  daily and monitor for change in behaviors.    5. Other seasonal allergic rhinitis Zyrtec-D 5/120mg  twice daily x 3 days then prn for congestion and itchy. Continue to monitor.    Family/Staff Communication Plan of care discuss with resident and professional staff members. Resident and professional staff members verbalize understanding and agree with plan of care. No additional questions or concerns reported.    Arthur Holms, MSN, AGNP-C [address], [place] [address] [phone] [8am-5pm] After hours: [phone]  I have personally reviewed this note and agree with the care plan  [facility], MD  [facility] [phone] (Monday-Friday 8 am - 5 pm) [phone] (afterhours)

## 2014-08-19 DIAGNOSIS — R0989 Other specified symptoms and signs involving the circulatory and respiratory systems: Secondary | ICD-10-CM | POA: Diagnosis not present

## 2014-08-23 ENCOUNTER — Non-Acute Institutional Stay (SKILLED_NURSING_FACILITY): Payer: Medicare Other | Admitting: Internal Medicine

## 2014-08-23 DIAGNOSIS — J0100 Acute maxillary sinusitis, unspecified: Secondary | ICD-10-CM | POA: Diagnosis not present

## 2014-08-23 DIAGNOSIS — I5033 Acute on chronic diastolic (congestive) heart failure: Secondary | ICD-10-CM

## 2014-08-23 NOTE — Progress Notes (Signed)
Patient ID: Heather Zimmerman, female   DOB: October 23, 1945, 68 y.o.   MRN: [id number]  Location:  [facility] Provider:  Rexene Edison. Mariea Clonts, D.O., C.M.D.  Code Status:  Full code  Chief Complaint  Patient presents with  . Acute Visit    cough, green sputum, hypoxia with POX 80-85, decreased lung sounds, noncompliant with fluid restriction, CXR with pulmonary venous congestion, also noncompliant with oxygen use    HPI:  68 yo morbidly obese black female with PAD, diabetes, OSA, CHF seen for acute visit due to increased cough productive of green sputum, hypoxia for more than a week (staff say only a couple of days) and CXR with pulmonary venous congestion (?).  She does not wear her oxygen like she is supposed to.    Review of Systems:  Review of Systems  Constitutional: Positive for chills and malaise/fatigue. Negative for fever.  HENT: Positive for congestion. Negative for sore throat.   Eyes: Negative for blurred vision.  Respiratory: Positive for cough, sputum production and shortness of breath.   Cardiovascular: Negative for chest pain.  Gastrointestinal: Negative for abdominal pain.  Genitourinary: Negative for dysuria.  Musculoskeletal: Negative for myalgias and falls.  Skin: Positive for itching and rash.  Neurological: Negative for dizziness and headaches.  Endo/Heme/Allergies: Does not bruise/bleed easily.  Psychiatric/Behavioral: Positive for memory loss.    Medications: Patient's Medications  New Prescriptions   No medications on file  Previous Medications   ACETAMINOPHEN (TYLENOL) 160 MG/5ML SOLUTION    Place 20.3 mLs (650 mg total) into feeding tube every 6 (six) hours as needed for fever.   ALUMINUM & MAGNESIUM HYDROXIDE-SIMETHICONE (MYLANTA) 500-450-40 MG/5ML SUSPENSION    Take 30 mLs by mouth every 4 (four) hours as needed for indigestion.   ASPIRIN 81 MG TABLET    Take 81 mg by mouth daily.   CAMPHOR-MENTHOL (SARNA) LOTION    Apply topically as needed  for itching.   CETIRIZINE-PSEUDOEPHEDRINE (ZYRTEC-D) 5-120 MG PER TABLET    Take 1 tablet by mouth 2 (two) times daily.   CHOLECALCIFEROL (VITAMIN D) 1000 UNITS TABLET    Take 2,000 Units by mouth daily.    DIPHENHYDRAMINE (SOMINEX) 25 MG TABLET    Take 25 mg by mouth every 12 (twelve) hours as needed for itching or sleep.   FLUTICASONE PROPIONATE (FLONASE NA)    Place 1 spray into the nose daily as needed. For nasal congestion   GABAPENTIN (NEURONTIN) 600 MG TABLET    Take 600 mg by mouth 2 (two) times daily.   INSULIN ASPART (NOVOLOG) 100 UNIT/ML INJECTION    Inject 5 Units into the skin 3 (three) times daily before meals. 5 units daily before meals for cbg > or = to 150   INSULIN GLARGINE (LANTUS SOLOSTAR) 100 UNIT/ML SOLOSTAR PEN    Inject 18 Units into the skin at bedtime.    IPRATROPIUM-ALBUTEROL (DUONEB) 0.5-2.5 (3) MG/3ML SOLN    Take 3 mLs by nebulization every 6 (six) hours as needed (shortness of breath).   LIDOCAINE (XYLOCAINE) 2 % SOLUTION    Use as directed 15 mLs in the mouth or throat every 6 (six) hours as needed for mouth pain.   LOSARTAN (COZAAR) 25 MG TABLET    Take 12.5 mg by mouth daily.    MAGNESIUM OXIDE (MAG-OX) 400 MG TABLET    Take 400 mg by mouth every 8 (eight) hours.    MUPIROCIN OINTMENT (BACTROBAN) 2 %    Place into the nose 2 (  two) times daily. Also apply Bactroban to left leg open wounds Q day (Do not send more, already at bedside)   OMEPRAZOLE (PRILOSEC) 20 MG CAPSULE    Take 20 mg by mouth daily.   POTASSIUM CHLORIDE SA (K-DUR,KLOR-CON) 20 MEQ TABLET    Take 20 mEq by mouth daily.   SERTRALINE (ZOLOFT) 50 MG TABLET    Take 50 mg by mouth daily.   TORSEMIDE (DEMADEX) 20 MG TABLET    Take 20 mg by mouth daily.  Modified Medications   No medications on file  Discontinued Medications   No medications on file    Physical Exam: Filed Vitals:   08/23/14 1901  BP: 121/60  Pulse: 79  Temp: 96.9 F (36.1 C)  Resp: 18  Height: 5\' 2"  (1.575 m)  Weight: 322 lb  (146.058 kg)  SpO2: 95%   Physical Exam  Constitutional:  Morbidly obese black female was actually in her room (typically at activities)  Cardiovascular: Normal rate, regular rhythm and normal heart sounds.   Pulmonary/Chest: Effort normal.  Coarse wet rhonchi  Abdominal: Soft. Bowel sounds are normal.  Musculoskeletal: Normal range of motion.  Right bka  Neurological: She is alert.  Psychiatric: She has a normal mood and affect.     Labs reviewed: Basic Metabolic Panel:  Recent Labs  06/21/14 0500  06/25/14 0522 06/26/14 0500 06/28/14 0651 08/01/14  NA 145  < > 145 142 146 143  K 3.6*  < > 4.1 3.7 3.9 4.0  CL 102  < > 103 97 97  --   CO2 34*  < > 35* 36* 36*  --   GLUCOSE 138*  < > 245* 229* 91  --   BUN 42*  < > 30* 33* 36* 21  CREATININE 1.48*  < > 1.28* 1.42* 1.72* 1.3*  CALCIUM 8.0*  < > 8.8 8.8 9.0  --   MG 2.0  --   --   --   --   --   PHOS 2.1*  --   --   --   --   --   < > = values in this interval not displayed.  Liver Function Tests:  Recent Labs  06/20/14 2100 06/22/14 0340 06/26/14 0500 08/01/14  AST 20 28 22 16   ALT 15 25 19 11   ALKPHOS 68 68 60 66  BILITOT 0.6 0.4 0.4  --   PROT 7.3 7.3 6.9  --   ALBUMIN 2.4* 2.2* 2.0*  --     CBC:  Recent Labs  06/20/14 2100  06/23/14 0445 06/25/14 0522 06/26/14 0500  WBC 16.5*  < > 8.5 6.8 8.1  NEUTROABS 15.1*  --   --   --  5.7  HGB 11.3*  < > 10.2* 9.0* 9.2*  HCT 36.2  < > 33.7* 29.6* 29.6*  MCV 91.2  < > 94.4 92.2 91.6  PLT 184  < > 147* 145* 172  < > = values in this interval not displayed.  Significant Diagnostic Results: CXR as in hpi  Assessment/Plan 1. Acute maxillary sinusitis, recurrence not specified -will treat with augmentin for a week  2. Acute on chronic diastolic CHF (congestive heart failure) -torsemide 20mg  daily for 3 days  Family/ staff Communication: seen with unit supervisor  Goals of care: long term care resident, full code  Labs/tests ordered:  No new  labs

## 2014-09-06 ENCOUNTER — Encounter: Payer: Self-pay | Admitting: Internal Medicine

## 2014-09-29 ENCOUNTER — Non-Acute Institutional Stay (SKILLED_NURSING_FACILITY): Payer: Medicare Other | Admitting: Adult Health

## 2014-09-29 DIAGNOSIS — I5032 Chronic diastolic (congestive) heart failure: Secondary | ICD-10-CM | POA: Diagnosis not present

## 2014-09-29 DIAGNOSIS — E1165 Type 2 diabetes mellitus with hyperglycemia: Secondary | ICD-10-CM | POA: Diagnosis not present

## 2014-09-29 DIAGNOSIS — E1143 Type 2 diabetes mellitus with diabetic autonomic (poly)neuropathy: Secondary | ICD-10-CM

## 2014-09-29 DIAGNOSIS — E1140 Type 2 diabetes mellitus with diabetic neuropathy, unspecified: Secondary | ICD-10-CM | POA: Diagnosis not present

## 2014-09-29 DIAGNOSIS — K2100 Gastro-esophageal reflux disease with esophagitis, without bleeding: Secondary | ICD-10-CM

## 2014-09-29 DIAGNOSIS — I10 Essential (primary) hypertension: Secondary | ICD-10-CM

## 2014-09-29 DIAGNOSIS — IMO0002 Reserved for concepts with insufficient information to code with codable children: Secondary | ICD-10-CM

## 2014-10-09 ENCOUNTER — Encounter: Payer: Self-pay | Admitting: Adult Health

## 2014-10-09 DIAGNOSIS — E1143 Type 2 diabetes mellitus with diabetic autonomic (poly)neuropathy: Secondary | ICD-10-CM | POA: Insufficient documentation

## 2014-10-09 NOTE — Progress Notes (Signed)
Patient ID: Heather Zimmerman. Heather Zimmerman, female   DOB: May 13, 1946, 68 y.o.   MRN: [id number]  [facility]     Allergies  Allergen Reactions  . Ace Inhibitors     unknown       Chief Complaint  Patient presents with  . Medical Management of Chronic Issues    HPI:  She is a long term resident of this facility being seen for the management of her chronic illnesses. Overall her status is stable. She is not voicing any complaints. There are no nursing concerns being voiced today.    Past Medical History  Diagnosis Date  . 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 Essential hypertension, benign 12/31/2012  . Chronic diastolic heart failure 2/99/3716  . GERD 12/31/2012  . Unspecified vitamin D deficiency 12/31/2012  . Chronic pain 12/31/2012  . Venous stasis ulcers   . Venous insufficiency (chronic) (peripheral)     Past Surgical History  Procedure Laterality Date  . Leg amputation below knee Right 01/03/2012    VITAL SIGNS BP 145/50 mmHg  Pulse 87  Ht 5\' 2"  (1.575 m)  Wt 325 lb (147.419 kg)  BMI 59.43 kg/m2  SpO2 98%   Outpatient Encounter Prescriptions as of 09/29/2014  Medication Sig  . acetaminophen (TYLENOL) 160 MG/5ML solution Place 20.3 mLs (650 mg total) into feeding tube every 6 (six) hours as needed for fever.  Marland Kitchen aluminum & magnesium hydroxide-simethicone (MYLANTA) 500-450-40 MG/5ML suspension Take 30 mLs by mouth every 4 (four) hours as needed for indigestion.  Marland Kitchen aspirin 81 MG tablet Take 81 mg by mouth daily.  . camphor-menthol (SARNA) lotion Apply topically as needed for itching.  . cetirizine-pseudoephedrine (ZYRTEC-D) 5-120 MG per tablet Take 1 tablet by mouth 2 (two) times daily.  . cholecalciferol (VITAMIN D) 1000 UNITS tablet Take 2,000 Units by mouth daily.   . diphenhydrAMINE (SOMINEX) 25 MG tablet Take 25 mg by mouth every 12 (twelve) hours as needed for itching or  sleep.  Marland Kitchen Fluticasone Propionate (FLONASE NA) Place 1 spray into the nose daily as needed. For nasal congestion  . gabapentin (NEURONTIN) 600 MG tablet Take 600 mg by mouth 2 (two) times daily.  . insulin aspart (NOVOLOG) 100 UNIT/ML injection Inject 5 Units into the skin 3 (three) times daily before meals. 5 units daily before meals for cbg > or = to 150  . Insulin Glargine (LANTUS SOLOSTAR) 100 UNIT/ML Solostar Pen Inject 18 Units into the skin at bedtime.   Marland Kitchen ipratropium-albuterol (DUONEB) 0.5-2.5 (3) MG/3ML SOLN Take 3 mLs by nebulization every 6 (six) hours as needed (shortness of breath).  . losartan (COZAAR) 25 MG tablet Take 12.5 mg by mouth daily.   . magnesium oxide (MAG-OX) 400 MG tablet Take 400 mg by mouth every 8 (eight) hours.   Marland Kitchen omeprazole (PRILOSEC) 20 MG capsule Take 20 mg by mouth daily.  . potassium chloride SA (K-DUR,KLOR-CON) 20 MEQ tablet Take 20 mEq by mouth daily.  . sertraline (ZOLOFT) 50 MG tablet Take 50 mg by mouth daily.  Marland Kitchen torsemide (DEMADEX) 20 MG tablet Take 20 mg by mouth daily.  . [DISCONTINUED] lidocaine (XYLOCAINE) 2 % solution Use as directed 15 mLs in the mouth or throat every 6 (six) hours as needed for mouth pain. (Patient not taking: Reported on 10/09/2014)  . [DISCONTINUED] mupirocin ointment (BACTROBAN) 2 % Place into the nose 2 (two) times daily. Also apply Bactroban to left leg open wounds  Q day (Do not send more, already at bedside) (Patient not taking: Reported on 10/09/2014)     SIGNIFICANT DIAGNOSTIC EXAMS  08-19-14: Chest x-ray: mild pulmonary venous congestion   LABS REVIEWED:   08-01-14: glucose 124; bun 21; creat 1.27; k+4.0; na++143; liver normal albumin 3.1; tsh 2.374   hgb a1c 6.7 ;BNP 16.9 08-11-14: hgb a1c 6.6    Review of Systems  Constitutional: Negative for malaise/fatigue.  Respiratory: Negative for cough and shortness of breath.   Cardiovascular: Negative for chest pain, palpitations and PND.  Gastrointestinal: Negative  for heartburn, abdominal pain and constipation.  Musculoskeletal: Negative for myalgias and joint pain.  Skin: Negative.   Psychiatric/Behavioral: Negative for depression. The patient is not nervous/anxious.      Physical Exam  Constitutional: No distress.  Morbidly obese   Neck: Neck supple. No JVD present.  Cardiovascular: Normal rate, regular rhythm and intact distal pulses.   Respiratory: Effort normal and breath sounds normal. No respiratory distress.  GI: Soft. Bowel sounds are normal. She exhibits no distension. There is no tenderness.  Musculoskeletal: She exhibits no edema.  Is able to move all extremities  Is status post right bka   Neurological: She is alert.  Skin: Skin is warm and dry. She is not diaphoretic.       ASSESSMENT/ PLAN:  1. Chronic diastolic heart failure: is on 2000 cc fluid restriction; will continue demadex 20 mg daily with k+ 20 meq daily  and will monitor   2. Hypertension: will continue cozaar 12. 5 mg daily; asa 81 mg daily  will monitor   3. Diabetes: will continue lantus 18 units daily; and novolog 5 units prior to meals for cbg >=150.   4. Peripheral neuropathy: is stable will continue neurontin 600 mg twice daily   5. Gerd: will continue prilosec 20 mg daily   6. Hypomagnesemia: will continue magox 400 mg three times daily   7. Depression: she is stable is receiving benefit from zoloft 50 mg daily     Ok Edwards NP [facility]  Contact [phone] Monday through Friday 8am- 5pm  After hours call [phone]

## 2014-10-22 ENCOUNTER — Other Ambulatory Visit: Payer: Self-pay | Admitting: Internal Medicine

## 2014-10-22 DIAGNOSIS — N189 Chronic kidney disease, unspecified: Secondary | ICD-10-CM | POA: Diagnosis not present

## 2014-10-28 ENCOUNTER — Non-Acute Institutional Stay (SKILLED_NURSING_FACILITY): Payer: Medicare Other | Admitting: Adult Health

## 2014-10-28 DIAGNOSIS — F329 Major depressive disorder, single episode, unspecified: Secondary | ICD-10-CM | POA: Diagnosis not present

## 2014-10-28 DIAGNOSIS — G4733 Obstructive sleep apnea (adult) (pediatric): Secondary | ICD-10-CM

## 2014-10-28 DIAGNOSIS — G8929 Other chronic pain: Secondary | ICD-10-CM | POA: Diagnosis not present

## 2014-10-28 DIAGNOSIS — I10 Essential (primary) hypertension: Secondary | ICD-10-CM | POA: Diagnosis not present

## 2014-10-28 DIAGNOSIS — E1149 Type 2 diabetes mellitus with other diabetic neurological complication: Secondary | ICD-10-CM

## 2014-10-28 DIAGNOSIS — K2100 Gastro-esophageal reflux disease with esophagitis, without bleeding: Secondary | ICD-10-CM

## 2014-10-28 DIAGNOSIS — Z89511 Acquired absence of right leg below knee: Secondary | ICD-10-CM

## 2014-10-28 DIAGNOSIS — E1143 Type 2 diabetes mellitus with diabetic autonomic (poly)neuropathy: Secondary | ICD-10-CM | POA: Diagnosis not present

## 2014-10-28 DIAGNOSIS — E1140 Type 2 diabetes mellitus with diabetic neuropathy, unspecified: Secondary | ICD-10-CM

## 2014-10-28 DIAGNOSIS — F32A Depression, unspecified: Secondary | ICD-10-CM

## 2014-10-28 DIAGNOSIS — I5032 Chronic diastolic (congestive) heart failure: Secondary | ICD-10-CM

## 2014-10-28 LAB — BASIC METABOLIC PANEL
BUN: 34 mg/dL — ABNORMAL HIGH (ref 6–23)
CO2: 36 mEq/L — ABNORMAL HIGH (ref 19–32)
Calcium: 9.2 mg/dL (ref 8.4–10.5)
Chloride: 101 mEq/L (ref 96–112)
Creat: 1.3 mg/dL — ABNORMAL HIGH (ref 0.50–1.10)
Glucose, Bld: 93 mg/dL (ref 70–99)
Potassium: 4.5 mEq/L (ref 3.5–5.3)
Sodium: 145 mEq/L (ref 135–145)

## 2014-11-05 ENCOUNTER — Other Ambulatory Visit: Payer: Self-pay | Admitting: Internal Medicine

## 2014-11-05 DIAGNOSIS — I5033 Acute on chronic diastolic (congestive) heart failure: Secondary | ICD-10-CM | POA: Diagnosis not present

## 2014-11-05 DIAGNOSIS — N189 Chronic kidney disease, unspecified: Secondary | ICD-10-CM | POA: Diagnosis not present

## 2014-11-08 DIAGNOSIS — H40013 Open angle with borderline findings, low risk, bilateral: Secondary | ICD-10-CM | POA: Diagnosis not present

## 2014-11-08 DIAGNOSIS — E119 Type 2 diabetes mellitus without complications: Secondary | ICD-10-CM | POA: Diagnosis not present

## 2014-11-08 DIAGNOSIS — H2513 Age-related nuclear cataract, bilateral: Secondary | ICD-10-CM | POA: Diagnosis not present

## 2014-11-11 LAB — BASIC METABOLIC PANEL
BUN: 36 mg/dL — ABNORMAL HIGH (ref 6–23)
CO2: 36 meq/L — AB (ref 19–32)
Calcium: 9.2 mg/dL (ref 8.4–10.5)
Chloride: 101 mEq/L (ref 96–112)
Creat: 1.4 mg/dL — ABNORMAL HIGH (ref 0.50–1.10)
Glucose, Bld: 117 mg/dL — ABNORMAL HIGH (ref 70–99)
Potassium: 3.8 mEq/L (ref 3.5–5.3)
SODIUM: 147 meq/L — AB (ref 135–145)

## 2014-11-21 DIAGNOSIS — E119 Type 2 diabetes mellitus without complications: Secondary | ICD-10-CM | POA: Diagnosis not present

## 2014-11-21 DIAGNOSIS — Z794 Long term (current) use of insulin: Secondary | ICD-10-CM | POA: Diagnosis not present

## 2014-11-21 DIAGNOSIS — M79675 Pain in left toe(s): Secondary | ICD-10-CM | POA: Diagnosis not present

## 2014-11-21 DIAGNOSIS — B351 Tinea unguium: Secondary | ICD-10-CM | POA: Diagnosis not present

## 2014-11-22 ENCOUNTER — Encounter: Payer: Self-pay | Admitting: Adult Health

## 2014-11-22 DIAGNOSIS — Z89511 Acquired absence of right leg below knee: Secondary | ICD-10-CM | POA: Insufficient documentation

## 2014-11-22 DIAGNOSIS — E1149 Type 2 diabetes mellitus with other diabetic neurological complication: Secondary | ICD-10-CM | POA: Insufficient documentation

## 2014-11-22 MED ORDER — TORSEMIDE 20 MG PO TABS: 20.0000 mg | ORAL_TABLET | Freq: Two times a day (BID) | ORAL | Status: DC

## 2014-11-22 NOTE — Progress Notes (Signed)
Patient ID: Heather Zimmerman. Genther, female   DOB: [birth year], 69 y.o.   MRN: [id number]  Heather Zimmerman living  [place]     Allergies  Allergen Reactions  . Ace Inhibitors     unknown       Chief Complaint  Patient presents with  . Medical Management of Chronic Issues    HPI:  She is a long term resident of this facility being seen for the management of her chronic illnesses. She states that she feels as though she has  "too much" water in legs in stomach. She was wondering if her fluid pill could be adjusted to help with her fluid balance. Her diabetes is stable; she rarely uses her novolog. The nursing staff is wondering if this medication could be stopped and her cbg's lowered to one time daily.    Past Medical History  Diagnosis Date  . 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 Essential hypertension, benign 12/31/2012  . Chronic diastolic heart failure 02/16/3975  . GERD 12/31/2012  . Unspecified vitamin D deficiency 12/31/2012  . Chronic pain 12/31/2012  . Venous stasis ulcers   . Venous insufficiency (chronic) (peripheral)     Past Surgical History  Procedure Laterality Date  . Leg amputation below knee Right 01/03/2012    VITAL SIGNS BP 130/75 mmHg  Pulse 70  Ht 5\' 2"  (1.575 m)  Wt 321 lb (145.605 kg)  BMI 58.70 kg/m2  SpO2 95%   Outpatient Encounter Prescriptions as of 10/28/2014  Medication Sig  . acetaminophen (TYLENOL) 160 MG/5ML solution Place 20.3 mLs (650 mg total) into feeding tube every 6 (six) hours as needed for fever.  Marland Kitchen aluminum & magnesium hydroxide-simethicone (MYLANTA) 500-450-40 MG/5ML suspension Take 30 mLs by mouth every 4 (four) hours as needed for indigestion.  Marland Kitchen aspirin 81 MG tablet Take 81 mg by mouth daily.  . camphor-menthol (SARNA) lotion Apply topically as needed for itching.  . cetirizine-pseudoephedrine (ZYRTEC-D) 5-120 MG per tablet Take 1 tablet by  mouth 2 (two) times daily.  . cholecalciferol (VITAMIN D) 1000 UNITS tablet Take 2,000 Units by mouth daily.   . diphenhydrAMINE (SOMINEX) 25 MG tablet Take 25 mg by mouth every 12 (twelve) hours as needed for itching or sleep.  Marland Kitchen Fluticasone Propionate (FLONASE NA) Place 1 spray into the nose daily as needed. For nasal congestion  . gabapentin (NEURONTIN) 600 MG tablet Take 600 mg by mouth 2 (two) times daily.  . insulin aspart (NOVOLOG) 100 UNIT/ML injection Inject 5 Units into the skin 3 (three) times daily before meals. 5 units daily before meals for cbg > or = to 150  . Insulin Glargine (LANTUS SOLOSTAR) 100 UNIT/ML Solostar Pen Inject 18 Units into the skin at bedtime.   Marland Kitchen ipratropium-albuterol (DUONEB) 0.5-2.5 (3) MG/3ML SOLN Take 3 mLs by nebulization every 6 (six) hours as needed (shortness of breath).  . losartan (COZAAR) 25 MG tablet Take 12.5 mg by mouth daily.   . magnesium oxide (MAG-OX) 400 MG tablet Take 400 mg by mouth every 8 (eight) hours.   Marland Kitchen omeprazole (PRILOSEC) 20 MG capsule Take 20 mg by mouth daily.  . potassium chloride SA (K-DUR,KLOR-CON) 20 MEQ tablet Take 20 mEq by mouth daily.  . sertraline (ZOLOFT) 50 MG tablet Take 50 mg by mouth daily.  Marland Kitchen torsemide (DEMADEX) 20 MG tablet Take 20 mg by mouth daily.     SIGNIFICANT DIAGNOSTIC EXAMS   08-19-14: Chest x-ray: mild pulmonary  venous congestion   LABS REVIEWED:   08-01-14: glucose 124; bun 21; creat 1.27; k+4.0; na++143; liver normal albumin 3.1; tsh 2.374   hgb a1c 6.7 ;BNP 16.9 08-11-14: hgb a1c 6.6 10-22-14: glucose 93; bun 24; creat 1.30; k+4.5; na++145      ROS Constitutional: Negative for malaise/fatigue.  Respiratory: Negative for cough and shortness of breath.   Cardiovascular: Negative for chest pain, palpitations and PND.  Gastrointestinal: Negative for heartburn, abdominal pain and constipation.  Musculoskeletal: Negative for myalgias and joint pain.  Skin: Negative.   Psychiatric/Behavioral:  Negative for depression. The patient is not nervous/anxious   Physical Exam Constitutional: No distress.  Morbidly obese   Neck: Neck supple. No JVD present.  Cardiovascular: Normal rate, regular rhythm and intact distal pulses.   Respiratory: Effort normal and breath sounds normal. No respiratory distress.  GI: Soft. Bowel sounds are normal. She exhibits no distension. There is no tenderness.  Musculoskeletal: She exhibits no edema.  Is able to move all extremities  Is status post right bka   Neurological: She is alert.  Skin: Skin is warm and dry. She is not diaphoretic.     ASSESSMENT/ PLAN:   1. Chronic diastolic heart failure: is on 2000 cc fluid restriction; will increase her demadex to 20 mg twice daily with k+ 20 meq daily and will monitor her status.   2. Hypertension: will continue cozaar 12. 5 mg daily; asa 81 mg daily  will monitor   3. Diabetes: will continue lantus 18 units daily; will stop the novolog and will check cbg daily and will monitor her status .  4. Peripheral neuropathy: is stable will continue neurontin 600 mg twice daily   5. Gerd: will continue prilosec 20 mg daily   6. Hypomagnesemia: will continue magox 400 mg three times daily   7. Depression: she is stable is receiving benefit from zoloft 50 mg daily   8. Status post right bka: no change in status   Will check bmp in 2 weeks.   Ok Edwards NP [facility]  Contact [phone] Monday through Friday 8am- 5pm  After hours call [phone]

## 2014-12-12 ENCOUNTER — Non-Acute Institutional Stay (SKILLED_NURSING_FACILITY): Payer: Medicare Other | Admitting: Adult Health

## 2014-12-12 DIAGNOSIS — I10 Essential (primary) hypertension: Secondary | ICD-10-CM | POA: Diagnosis not present

## 2014-12-12 DIAGNOSIS — E1140 Type 2 diabetes mellitus with diabetic neuropathy, unspecified: Secondary | ICD-10-CM

## 2014-12-12 DIAGNOSIS — E1143 Type 2 diabetes mellitus with diabetic autonomic (poly)neuropathy: Secondary | ICD-10-CM

## 2014-12-12 DIAGNOSIS — L97929 Non-pressure chronic ulcer of unspecified part of left lower leg with unspecified severity: Secondary | ICD-10-CM

## 2014-12-12 DIAGNOSIS — I5032 Chronic diastolic (congestive) heart failure: Secondary | ICD-10-CM

## 2014-12-12 DIAGNOSIS — I87312 Chronic venous hypertension (idiopathic) with ulcer of left lower extremity: Secondary | ICD-10-CM

## 2014-12-12 DIAGNOSIS — I83029 Varicose veins of left lower extremity with ulcer of unspecified site: Secondary | ICD-10-CM

## 2014-12-12 DIAGNOSIS — G4733 Obstructive sleep apnea (adult) (pediatric): Secondary | ICD-10-CM

## 2014-12-12 DIAGNOSIS — E1149 Type 2 diabetes mellitus with other diabetic neurological complication: Secondary | ICD-10-CM

## 2014-12-13 DIAGNOSIS — I771 Stricture of artery: Secondary | ICD-10-CM | POA: Diagnosis not present

## 2014-12-14 DIAGNOSIS — I10 Essential (primary) hypertension: Secondary | ICD-10-CM | POA: Diagnosis not present

## 2014-12-14 DIAGNOSIS — N189 Chronic kidney disease, unspecified: Secondary | ICD-10-CM | POA: Diagnosis not present

## 2014-12-14 DIAGNOSIS — I5033 Acute on chronic diastolic (congestive) heart failure: Secondary | ICD-10-CM | POA: Diagnosis not present

## 2014-12-14 DIAGNOSIS — E089 Diabetes mellitus due to underlying condition without complications: Secondary | ICD-10-CM | POA: Diagnosis not present

## 2014-12-14 DIAGNOSIS — D631 Anemia in chronic kidney disease: Secondary | ICD-10-CM | POA: Diagnosis not present

## 2014-12-27 ENCOUNTER — Encounter: Payer: Self-pay | Admitting: Adult Health

## 2014-12-27 DIAGNOSIS — L97929 Non-pressure chronic ulcer of unspecified part of left lower leg with unspecified severity: Secondary | ICD-10-CM

## 2014-12-27 DIAGNOSIS — I83029 Varicose veins of left lower extremity with ulcer of unspecified site: Secondary | ICD-10-CM | POA: Insufficient documentation

## 2014-12-27 NOTE — Progress Notes (Signed)
Patient ID: Heather Zimmerman. Heather Zimmerman, female   DOB: 1946/03/22, 69 y.o.   MRN: [id number]  [name] living [place]     Allergies  Allergen Reactions  . Ace Inhibitors     unknown       Chief Complaint  Patient presents with  . Medical Management of Chronic Issues    HPI:  She is a long term resident of this facility being seen for the management of her chronic illnesses. Overall her status is without significant change. She has venous ulcerations on her left lower leg. She is not complaining of pain present.    Past Medical History  Diagnosis Date  . 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 Essential hypertension, benign 12/31/2012  . Chronic diastolic heart failure 04/15/5008  . GERD 12/31/2012  . Unspecified vitamin D deficiency 12/31/2012  . Chronic pain 12/31/2012  . Venous stasis ulcers   . Venous insufficiency (chronic) (peripheral)     Past Surgical History  Procedure Laterality Date  . Leg amputation below knee Right 01/03/2012    VITAL SIGNS BP 130/70 mmHg  Pulse 68  Ht 4\' 9"  (1.448 m)  Wt 316 lb (143.337 kg)  BMI 68.36 kg/m2  SpO2 94%   Outpatient Encounter Prescriptions as of 12/12/2014  Medication Sig  . acetaminophen (TYLENOL) 160 MG/5ML solution Place 20.3 mLs (650 mg total) into feeding tube every 6 (six) hours as needed for fever.  Marland Kitchen aspirin 81 MG tablet Take 81 mg by mouth daily.  . camphor-menthol (SARNA) lotion Apply topically as needed for itching.  . cetirizine-pseudoephedrine (ZYRTEC-D) 5-120 MG per tablet Take 1 tablet by mouth 2 (two) times daily.  . cholecalciferol (VITAMIN D) 1000 UNITS tablet Take 2,000 Units by mouth daily.   . diphenhydrAMINE (SOMINEX) 25 MG tablet Take 25 mg by mouth every 12 (twelve) hours as needed for itching or sleep.  Marland Kitchen Fluticasone Propionate (FLONASE NA) Place 1 spray into the nose daily as needed. For nasal congestion  .  gabapentin (NEURONTIN) 600 MG tablet Take 600 mg by mouth 2 (two) times daily.  . Insulin Glargine (LANTUS SOLOSTAR) 100 UNIT/ML Solostar Pen Inject 18 Units into the skin at bedtime.   Marland Kitchen ipratropium-albuterol (DUONEB) 0.5-2.5 (3) MG/3ML SOLN Take 3 mLs by nebulization every 6 (six) hours as needed (shortness of breath).  . losartan (COZAAR) 25 MG tablet Take 12.5 mg by mouth daily.   . magnesium oxide (MAG-OX) 400 MG tablet Take 400 mg by mouth every 8 (eight) hours.   Marland Kitchen omeprazole (PRILOSEC) 20 MG capsule Take 20 mg by mouth daily.  . potassium chloride SA (K-DUR,KLOR-CON) 20 MEQ tablet Take 20 mEq by mouth daily.  . sertraline (ZOLOFT) 50 MG tablet Take 50 mg by mouth daily.  Marland Kitchen torsemide (DEMADEX) 20 MG tablet Take 1 tablet (20 mg total) by mouth 2 (two) times daily.  . [DISCONTINUED] aluminum & magnesium hydroxide-simethicone (MYLANTA) 500-450-40 MG/5ML suspension Take 30 mLs by mouth every 4 (four) hours as needed for indigestion.     SIGNIFICANT DIAGNOSTIC EXAMS  08-19-14: Chest x-ray: mild pulmonary venous congestion   LABS REVIEWED:   08-01-14: glucose 124; bun 21; creat 1.27; k+4.0; na++143; liver normal albumin 3.1; tsh 2.374   hgb a1c 6.7 ;BNP 16.9 08-11-14: hgb a1c 6.6 10-22-14: glucose 93; bun 24; creat 1.30; k+4.5; na++145  11-11-14: glucose 117; bun 36; creat 1.4; k+3.8; na++147       ROS Constitutional: Negative for malaise/fatigue.  Respiratory: Negative for cough and shortness of breath.   Cardiovascular: Negative for chest pain, palpitations and PND.  Gastrointestinal: Negative for heartburn, abdominal pain and constipation.  Musculoskeletal: Negative for myalgias and joint pain.  Skin: sore on left lower leg    Psychiatric/Behavioral: Negative for depression. The patient is not nervous/anxious    Physical Exam Constitutional: No distress.  Morbidly obese   Neck: Neck supple. No JVD present.  Cardiovascular: Normal rate, regular rhythm and intact distal  pulses.   Respiratory: Effort normal and breath sounds normal. No respiratory distress.  GI: Soft. Bowel sounds are normal. She exhibits no distension. There is no tenderness.  Musculoskeletal: She exhibits no edema.  Is able to move all extremities  Is status post right bka   Neurological: She is alert.  Skin: Skin is warm and dry. She is not diaphoretic. Left lower extremity venous ulcers: anterior upper: 2.8 x 3.4 cm Anterior lower: 1.5 x 1.3 cm Lateral upper: 2.0 x 1.4 cm Lateral lower: 1.7 x 0.7 cm Posterior: 2.0 x 1.4 cm [place] present on wound beds; will begin santyl      ASSESSMENT/ PLAN:   1. Chronic diastolic heart failure: is on 2000 cc fluid restriction; will increase her demadex to 20 mg twice daily with k+ 20 meq daily and will monitor her status.   2. Hypertension: will continue cozaar 12. 5 mg daily; asa 81 mg daily  will monitor   3. Diabetes: will continue lantus 18 units daily; will stop the novolog and will check cbg daily and will monitor her status .  4. Peripheral neuropathy: is stable will continue neurontin 600 mg twice daily   5. Gerd: will continue prilosec 20 mg daily   6. Hypomagnesemia: will continue magox 400 mg three times daily   7. Depression: she is stable is receiving benefit from zoloft 50 mg daily   8. Left lower leg ulcers: venous in nature secondary to diabetes: will use santyl to wound beds; and will get abi's will monitor her status.   9. Obstructive sleep apnea: she doe snot use her cpap on a regular basis at night. I have encouraged her to use this on a regular basis in order to improve her overall health. She did verbalize understanding and stated that she would try to use the machine on a more routine basis.     Will check hgb a1c; urine for micro-albumin; cbc; cmp; mag and pre-albumin     Ok Edwards NP [facility]  Contact [phone] Monday through Friday 8am- 5pm  After hours call [phone]

## 2015-01-13 DIAGNOSIS — F329 Major depressive disorder, single episode, unspecified: Secondary | ICD-10-CM | POA: Diagnosis not present

## 2015-01-19 ENCOUNTER — Non-Acute Institutional Stay (SKILLED_NURSING_FACILITY): Payer: Medicare Other | Admitting: Adult Health

## 2015-01-19 DIAGNOSIS — I5032 Chronic diastolic (congestive) heart failure: Secondary | ICD-10-CM

## 2015-01-19 DIAGNOSIS — E1140 Type 2 diabetes mellitus with diabetic neuropathy, unspecified: Secondary | ICD-10-CM | POA: Diagnosis not present

## 2015-01-19 DIAGNOSIS — I10 Essential (primary) hypertension: Secondary | ICD-10-CM | POA: Diagnosis not present

## 2015-01-19 DIAGNOSIS — E1143 Type 2 diabetes mellitus with diabetic autonomic (poly)neuropathy: Secondary | ICD-10-CM | POA: Diagnosis not present

## 2015-01-19 DIAGNOSIS — K2100 Gastro-esophageal reflux disease with esophagitis, without bleeding: Secondary | ICD-10-CM

## 2015-01-19 DIAGNOSIS — F32A Depression, unspecified: Secondary | ICD-10-CM

## 2015-01-19 DIAGNOSIS — F329 Major depressive disorder, single episode, unspecified: Secondary | ICD-10-CM

## 2015-01-19 DIAGNOSIS — E1149 Type 2 diabetes mellitus with other diabetic neurological complication: Secondary | ICD-10-CM

## 2015-01-19 DIAGNOSIS — I87312 Chronic venous hypertension (idiopathic) with ulcer of left lower extremity: Secondary | ICD-10-CM | POA: Diagnosis not present

## 2015-01-19 DIAGNOSIS — I83029 Varicose veins of left lower extremity with ulcer of unspecified site: Secondary | ICD-10-CM

## 2015-01-19 DIAGNOSIS — L97929 Non-pressure chronic ulcer of unspecified part of left lower leg with unspecified severity: Secondary | ICD-10-CM

## 2015-01-19 DIAGNOSIS — G4733 Obstructive sleep apnea (adult) (pediatric): Secondary | ICD-10-CM | POA: Diagnosis not present

## 2015-01-23 DIAGNOSIS — N189 Chronic kidney disease, unspecified: Secondary | ICD-10-CM | POA: Diagnosis not present

## 2015-01-23 DIAGNOSIS — I5033 Acute on chronic diastolic (congestive) heart failure: Secondary | ICD-10-CM | POA: Diagnosis not present

## 2015-01-27 DIAGNOSIS — N189 Chronic kidney disease, unspecified: Secondary | ICD-10-CM | POA: Diagnosis not present

## 2015-01-27 DIAGNOSIS — I5033 Acute on chronic diastolic (congestive) heart failure: Secondary | ICD-10-CM | POA: Diagnosis not present

## 2015-02-03 DIAGNOSIS — R635 Abnormal weight gain: Secondary | ICD-10-CM | POA: Diagnosis not present

## 2015-02-03 DIAGNOSIS — I10 Essential (primary) hypertension: Secondary | ICD-10-CM | POA: Diagnosis not present

## 2015-02-03 DIAGNOSIS — E089 Diabetes mellitus due to underlying condition without complications: Secondary | ICD-10-CM | POA: Diagnosis not present

## 2015-02-14 DIAGNOSIS — F329 Major depressive disorder, single episode, unspecified: Secondary | ICD-10-CM | POA: Diagnosis not present

## 2015-02-15 DIAGNOSIS — E089 Diabetes mellitus due to underlying condition without complications: Secondary | ICD-10-CM | POA: Diagnosis not present

## 2015-02-15 DIAGNOSIS — R635 Abnormal weight gain: Secondary | ICD-10-CM | POA: Diagnosis not present

## 2015-02-17 ENCOUNTER — Non-Acute Institutional Stay (SKILLED_NURSING_FACILITY): Payer: Medicare Other | Admitting: Adult Health

## 2015-02-17 DIAGNOSIS — K2100 Gastro-esophageal reflux disease with esophagitis, without bleeding: Secondary | ICD-10-CM

## 2015-02-17 DIAGNOSIS — F329 Major depressive disorder, single episode, unspecified: Secondary | ICD-10-CM

## 2015-02-17 DIAGNOSIS — E1143 Type 2 diabetes mellitus with diabetic autonomic (poly)neuropathy: Secondary | ICD-10-CM

## 2015-02-17 DIAGNOSIS — I87312 Chronic venous hypertension (idiopathic) with ulcer of left lower extremity: Secondary | ICD-10-CM | POA: Diagnosis not present

## 2015-02-17 DIAGNOSIS — I5032 Chronic diastolic (congestive) heart failure: Secondary | ICD-10-CM

## 2015-02-17 DIAGNOSIS — I10 Essential (primary) hypertension: Secondary | ICD-10-CM

## 2015-02-17 DIAGNOSIS — E1140 Type 2 diabetes mellitus with diabetic neuropathy, unspecified: Secondary | ICD-10-CM | POA: Diagnosis not present

## 2015-02-17 DIAGNOSIS — I83029 Varicose veins of left lower extremity with ulcer of unspecified site: Secondary | ICD-10-CM

## 2015-02-17 DIAGNOSIS — E1149 Type 2 diabetes mellitus with other diabetic neurological complication: Secondary | ICD-10-CM

## 2015-02-17 DIAGNOSIS — F32A Depression, unspecified: Secondary | ICD-10-CM

## 2015-02-17 DIAGNOSIS — L97929 Non-pressure chronic ulcer of unspecified part of left lower leg with unspecified severity: Secondary | ICD-10-CM

## 2015-02-17 DIAGNOSIS — G4733 Obstructive sleep apnea (adult) (pediatric): Secondary | ICD-10-CM | POA: Diagnosis not present

## 2015-03-07 DIAGNOSIS — R262 Difficulty in walking, not elsewhere classified: Secondary | ICD-10-CM | POA: Diagnosis not present

## 2015-03-07 DIAGNOSIS — B351 Tinea unguium: Secondary | ICD-10-CM | POA: Diagnosis not present

## 2015-03-07 DIAGNOSIS — M79675 Pain in left toe(s): Secondary | ICD-10-CM | POA: Diagnosis not present

## 2015-03-07 DIAGNOSIS — E1151 Type 2 diabetes mellitus with diabetic peripheral angiopathy without gangrene: Secondary | ICD-10-CM | POA: Diagnosis not present

## 2015-03-09 ENCOUNTER — Encounter (HOSPITAL_BASED_OUTPATIENT_CLINIC_OR_DEPARTMENT_OTHER): Payer: Medicare Other | Attending: Plastic Surgery

## 2015-03-09 DIAGNOSIS — I10 Essential (primary) hypertension: Secondary | ICD-10-CM | POA: Diagnosis not present

## 2015-03-09 DIAGNOSIS — I739 Peripheral vascular disease, unspecified: Secondary | ICD-10-CM | POA: Diagnosis not present

## 2015-03-09 DIAGNOSIS — Z794 Long term (current) use of insulin: Secondary | ICD-10-CM | POA: Diagnosis not present

## 2015-03-09 DIAGNOSIS — I509 Heart failure, unspecified: Secondary | ICD-10-CM | POA: Insufficient documentation

## 2015-03-09 DIAGNOSIS — I890 Lymphedema, not elsewhere classified: Secondary | ICD-10-CM | POA: Diagnosis not present

## 2015-03-09 DIAGNOSIS — E11622 Type 2 diabetes mellitus with other skin ulcer: Secondary | ICD-10-CM | POA: Diagnosis not present

## 2015-03-09 DIAGNOSIS — L97821 Non-pressure chronic ulcer of other part of left lower leg limited to breakdown of skin: Secondary | ICD-10-CM | POA: Diagnosis not present

## 2015-03-10 DIAGNOSIS — E0822 Diabetes mellitus due to underlying condition with diabetic chronic kidney disease: Secondary | ICD-10-CM | POA: Diagnosis not present

## 2015-03-10 DIAGNOSIS — N184 Chronic kidney disease, stage 4 (severe): Secondary | ICD-10-CM | POA: Diagnosis not present

## 2015-03-10 DIAGNOSIS — F329 Major depressive disorder, single episode, unspecified: Secondary | ICD-10-CM | POA: Diagnosis not present

## 2015-03-16 ENCOUNTER — Encounter (HOSPITAL_BASED_OUTPATIENT_CLINIC_OR_DEPARTMENT_OTHER): Payer: Medicare Other | Attending: Internal Medicine

## 2015-03-16 DIAGNOSIS — A4902 Methicillin resistant Staphylococcus aureus infection, unspecified site: Secondary | ICD-10-CM | POA: Insufficient documentation

## 2015-03-16 DIAGNOSIS — I872 Venous insufficiency (chronic) (peripheral): Secondary | ICD-10-CM | POA: Diagnosis not present

## 2015-03-16 DIAGNOSIS — E11622 Type 2 diabetes mellitus with other skin ulcer: Secondary | ICD-10-CM | POA: Insufficient documentation

## 2015-03-16 DIAGNOSIS — L97821 Non-pressure chronic ulcer of other part of left lower leg limited to breakdown of skin: Secondary | ICD-10-CM | POA: Diagnosis not present

## 2015-03-24 ENCOUNTER — Non-Acute Institutional Stay (SKILLED_NURSING_FACILITY): Payer: Medicare Other | Admitting: Adult Health

## 2015-03-24 DIAGNOSIS — I10 Essential (primary) hypertension: Secondary | ICD-10-CM

## 2015-03-24 DIAGNOSIS — F329 Major depressive disorder, single episode, unspecified: Secondary | ICD-10-CM

## 2015-03-24 DIAGNOSIS — K2100 Gastro-esophageal reflux disease with esophagitis, without bleeding: Secondary | ICD-10-CM

## 2015-03-24 DIAGNOSIS — G4733 Obstructive sleep apnea (adult) (pediatric): Secondary | ICD-10-CM | POA: Diagnosis not present

## 2015-03-24 DIAGNOSIS — I87312 Chronic venous hypertension (idiopathic) with ulcer of left lower extremity: Secondary | ICD-10-CM

## 2015-03-24 DIAGNOSIS — E1149 Type 2 diabetes mellitus with other diabetic neurological complication: Secondary | ICD-10-CM

## 2015-03-24 DIAGNOSIS — I5032 Chronic diastolic (congestive) heart failure: Secondary | ICD-10-CM

## 2015-03-24 DIAGNOSIS — E1140 Type 2 diabetes mellitus with diabetic neuropathy, unspecified: Secondary | ICD-10-CM | POA: Diagnosis not present

## 2015-03-24 DIAGNOSIS — I83029 Varicose veins of left lower extremity with ulcer of unspecified site: Secondary | ICD-10-CM

## 2015-03-24 DIAGNOSIS — E1143 Type 2 diabetes mellitus with diabetic autonomic (poly)neuropathy: Secondary | ICD-10-CM | POA: Diagnosis not present

## 2015-03-24 DIAGNOSIS — F32A Depression, unspecified: Secondary | ICD-10-CM

## 2015-03-24 DIAGNOSIS — L97929 Non-pressure chronic ulcer of unspecified part of left lower leg with unspecified severity: Secondary | ICD-10-CM

## 2015-03-24 DIAGNOSIS — G8929 Other chronic pain: Secondary | ICD-10-CM | POA: Diagnosis not present

## 2015-03-30 ENCOUNTER — Encounter: Payer: Self-pay | Admitting: Adult Health

## 2015-03-30 DIAGNOSIS — I872 Venous insufficiency (chronic) (peripheral): Secondary | ICD-10-CM | POA: Diagnosis not present

## 2015-03-30 DIAGNOSIS — E11622 Type 2 diabetes mellitus with other skin ulcer: Secondary | ICD-10-CM | POA: Diagnosis not present

## 2015-03-30 DIAGNOSIS — A4902 Methicillin resistant Staphylococcus aureus infection, unspecified site: Secondary | ICD-10-CM | POA: Diagnosis not present

## 2015-03-30 DIAGNOSIS — L97821 Non-pressure chronic ulcer of other part of left lower leg limited to breakdown of skin: Secondary | ICD-10-CM | POA: Diagnosis not present

## 2015-03-30 NOTE — Progress Notes (Signed)
Patient ID: Heather Zimmerman. Ehresman, female   DOB: 1946/01/27, 69 y.o.   MRN: [id number]  Heather Zimmerman living [place]     Allergies  Allergen Reactions  . Ace Inhibitors     unknown       Chief Complaint  Patient presents with  . Medical Management of Chronic Issues    HPI:  She is a long term resident of this facility being seen for the management of her chronic illnesses. Overall her status is without significant change. She is not voicing any complaints or concerns. There are no nursing concerns at this time.    Past Medical History  Diagnosis Date  . 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 Essential hypertension, benign 12/31/2012  . Chronic diastolic heart failure 01/20/8118  . GERD 12/31/2012  . Unspecified vitamin D deficiency 12/31/2012  . Chronic pain 12/31/2012  . Venous stasis ulcers   . Venous insufficiency (chronic) (peripheral)     Past Surgical History  Procedure Laterality Date  . Leg amputation below knee Right 01/03/2012    VITAL SIGNS BP 140/70 mmHg  Pulse 83  Ht 4\' 9"  (1.448 m)  Wt 322 lb (146.058 kg)  BMI 69.66 kg/m2  SpO2 93%   Outpatient Encounter Prescriptions as of 01/19/2015  Medication Sig  . acetaminophen (TYLENOL) 160 MG/5ML solution Place 20.3 mLs (650 mg total) into feeding tube every 6 (six) hours as needed for fever.  Marland Kitchen aspirin 81 MG tablet Take 81 mg by mouth daily.  . camphor-menthol (SARNA) lotion Apply topically as needed for itching.  . cetirizine-pseudoephedrine (ZYRTEC-D) 5-120 MG per tablet Take 1 tablet by mouth 2 (two) times daily.  . cholecalciferol (VITAMIN D) 1000 UNITS tablet Take 2,000 Units by mouth daily.   . Fluticasone Propionate (FLONASE NA) Place 1 spray into the nose daily as needed. For nasal congestion  . gabapentin (NEURONTIN) 600 MG tablet Take 600 mg by mouth 2 (two) times daily.  . Insulin Glargine (LANTUS SOLOSTAR) 100  UNIT/ML Solostar Pen Inject 18 Units into the skin at bedtime.   Marland Kitchen ipratropium-albuterol (DUONEB) 0.5-2.5 (3) MG/3ML SOLN Take 3 mLs by nebulization every 6 (six) hours as needed (shortness of breath).  . losartan (COZAAR) 25 MG tablet Take 12.5 mg by mouth daily.   . magnesium oxide (MAG-OX) 400 MG tablet Take 400 mg by mouth every 8 (eight) hours.   Marland Kitchen omeprazole (PRILOSEC) 20 MG capsule Take 20 mg by mouth daily.  . potassium chloride SA (K-DUR,KLOR-CON) 20 MEQ tablet Take 20 mEq by mouth daily.  . sertraline (ZOLOFT) 50 MG tablet Take 50 mg by mouth daily.  Marland Kitchen torsemide (DEMADEX) 20 MG tablet Take 1 tablet (20 mg total) by mouth 2 (two) times daily.      SIGNIFICANT DIAGNOSTIC EXAMS  08-19-14: Chest x-ray: mild pulmonary venous congestion   LABS REVIEWED:   08-01-14: glucose 124; bun 21; creat 1.27; k+4.0; na++143; liver normal albumin 3.1; tsh 2.374   hgb a1c 6.7 ;BNP 16.9 08-11-14: hgb a1c 6.6 10-22-14: glucose 93; bun 24; creat 1.30; k+4.5; na++145  11-11-14: glucose 117; bun 36; creat 1.4; k+3.8; na++147  12-14-14: wbc 5.3; hgb 10.7; hct 34.2; mcv 89; plt 204; glucose 88; bun 30; creat 1.3; k+4.4; na++144; liver normal albumin 3.0; pre-albumin 21.1; hgb a1c 6.4; urine micro-albumin 1.0     ROS Constitutional: Negative for malaise/fatigue.  Respiratory: Negative for cough and shortness of breath.   Cardiovascular: Negative for chest  pain, palpitations and PND.  Gastrointestinal: Negative for heartburn, abdominal pain and constipation.  Musculoskeletal: Negative for myalgias and joint pain.  Skin: sore on left lower leg    Psychiatric/Behavioral: Negative for depression. The patient is not nervous/anxious     Physical Exam Constitutional: No distress.  Morbidly obese   Neck: Neck supple. No JVD present.  Cardiovascular: Normal rate, regular rhythm and intact distal pulses.   Respiratory: Effort normal and breath sounds normal. No respiratory distress.  GI: Soft. Bowel  sounds are normal. She exhibits no distension. There is no tenderness.  Musculoskeletal: She exhibits no edema.  Is able to move all extremities  Is status post right bka   Neurological: She is alert.  Skin: Skin is warm and dry. She is not diaphoretic. Left lower extremity venous ulcers: without change in status       ASSESSMENT/ PLAN:  1. Chronic diastolic heart failure: is on 1500 cc fluid restriction; will continue  her demadex  20 mg twice daily with k+ 20 meq daily and will monitor her status.   2. Hypertension: will continue  asa 81 mg daily will increase cozaar to 25 mg daily will have nursing check blood pressure every shift  will monitor   3. Diabetes: will continue lantus 18 units daily; her hgb a1c is 6.4; her novolog has been stopped.   4. Peripheral neuropathy: is stable will continue neurontin 600 mg twice daily   5. Gerd: will continue prilosec 20 mg daily   6. Hypomagnesemia: will continue magox 400 mg three times daily   7. Depression: she is stable is receiving benefit from zoloft 50 mg daily   8. Left lower leg ulcers: venous in nature secondary to diabetes: will use santyl to wound beds; and will get abi's will monitor her status.   9. Obstructive sleep apnea: she is trying to use cpap on a regular basis at night. I have encouraged her to use this on a regular basis in order to improve her overall health. She did verbalize understanding and stated that she would try to use the machine on a more routine basis.    Will check bmp and mag level in 2 weeks.    Ok Edwards NP [facility]  Contact [phone] Monday through Friday 8am- 5pm  After hours call [phone]

## 2015-04-06 DIAGNOSIS — E11622 Type 2 diabetes mellitus with other skin ulcer: Secondary | ICD-10-CM | POA: Diagnosis not present

## 2015-04-06 DIAGNOSIS — A4902 Methicillin resistant Staphylococcus aureus infection, unspecified site: Secondary | ICD-10-CM | POA: Diagnosis not present

## 2015-04-06 DIAGNOSIS — I872 Venous insufficiency (chronic) (peripheral): Secondary | ICD-10-CM | POA: Diagnosis not present

## 2015-04-06 DIAGNOSIS — L97821 Non-pressure chronic ulcer of other part of left lower leg limited to breakdown of skin: Secondary | ICD-10-CM | POA: Diagnosis not present

## 2015-04-13 NOTE — Progress Notes (Signed)
Patient ID: Heather Zimmerman. [place], female   DOB: [birth year], 69 y.o.   MRN: [id number]  [name] living [place]     Allergies  Allergen Reactions  . Ace Inhibitors     unknown       Chief Complaint  Patient presents with  . Medical Management of Chronic Issues    HPI:  She is a long term resident of this facility being seen for the management of her chronic illnesses. Her left leg ulcerations are worse. The treatment nurse would like to send her to the wound center for further evaluation and treatment options. She is not voicing any concerns at this time.    Past Medical History  Diagnosis Date  . 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 Essential hypertension, benign 12/31/2012  . Chronic diastolic heart failure 8/46/9629  . GERD 12/31/2012  . Unspecified vitamin D deficiency 12/31/2012  . Chronic pain 12/31/2012  . Venous stasis ulcers   . Venous insufficiency (chronic) (peripheral)     Past Surgical History  Procedure Laterality Date  . Leg amputation below knee Right 01/03/2012    VITAL SIGNS BP 120/62 mmHg  Pulse 83  Ht 4\' 9"  (1.448 m)  Wt 322 lb (146.058 kg)  BMI 69.66 kg/m2  SpO2 92%   Outpatient Encounter Prescriptions as of 02/17/2015  Medication Sig  . acetaminophen (TYLENOL) 160 MG/5ML solution Place 20.3 mLs (650 mg total) into feeding tube every 6 (six) hours as needed for fever.  Marland Kitchen aspirin 81 MG tablet Take 81 mg by mouth daily.  . camphor-menthol (SARNA) lotion Apply topically as needed for itching.  . cetirizine-pseudoephedrine (ZYRTEC-D) 5-120 MG per tablet Take 1 tablet by mouth 2 (two) times daily.  . cholecalciferol (VITAMIN D) 1000 UNITS tablet Take 2,000 Units by mouth daily.   . Fluticasone Propionate (FLONASE NA) Place 1 spray into the nose daily as needed. For nasal congestion  . gabapentin (NEURONTIN) 600 MG tablet Take 600 mg by mouth 2 (two) times  daily.  . Insulin Glargine (LANTUS SOLOSTAR) 100 UNIT/ML Solostar Pen Inject 18 Units into the skin at bedtime.   Marland Kitchen ipratropium-albuterol (DUONEB) 0.5-2.5 (3) MG/3ML SOLN Take 3 mLs by nebulization every 6 (six) hours as needed (shortness of breath).  . losartan (COZAAR) 25 MG tablet Take 12.5 mg by mouth daily.   . magnesium oxide (MAG-OX) 400 MG tablet Take 400 mg by mouth every 8 (eight) hours.   Marland Kitchen omeprazole (PRILOSEC) 20 MG capsule Take 20 mg by mouth daily.  . potassium chloride SA (K-DUR,KLOR-CON) 20 MEQ tablet Take 20 mEq by mouth daily.  . sertraline (ZOLOFT) 50 MG tablet Take 50 mg by mouth daily.  Marland Kitchen torsemide (DEMADEX) 20 MG tablet Take 1 tablet (20 mg total) by mouth 2 (two) times daily.   No facility-administered encounter medications on file as of 02/17/2015.     SIGNIFICANT DIAGNOSTIC EXAMS  08-19-14: Chest x-ray: mild pulmonary venous congestion   12-13-14: ABI: on left leg: left intra popliteal stenotic disease in the minimal ischemia range.    LABS REVIEWED:   08-01-14: glucose 124; bun 21; creat 1.27; k+4.0; na++143; liver normal albumin 3.1; tsh 2.374   hgb a1c 6.7 ;BNP 16.9 08-11-14: hgb a1c 6.6 10-22-14: glucose 93; bun 24; creat 1.30; k+4.5; na++145  11-11-14: glucose 117; bun 36; creat 1.4; k+3.8; na++147  12-14-14: wbc 5.3; hgb 10.7; hct 34.2; mcv 89; plt 204; glucose 88; bun 30; creat 1.3;  k+4.4; na++144; liver normal albumin 3.0; pre-albumin 21.1; hgb a1c 6.4; urine micro-albumin 1.0  01-23-15: glucose 132; bun 32; creat 1.56; k+4.2; na++141 01-27-15: glucose 135; bun 34; creat 1.20; k+3.9; na++145 02-03-15: glucose 125; bun 32; creat 1.26; k+4.2; na++143 02-16-15: vit d 34; tsh 2.657     ROS Constitutional: Negative for malaise/fatigue.  Respiratory: Negative for cough and shortness of breath.   Cardiovascular: Negative for chest pain, palpitations and PND.  Gastrointestinal: Negative for heartburn, abdominal pain and constipation.  Musculoskeletal: Negative for  myalgias and joint pain.  Skin: sore on left lower leg    Psychiatric/Behavioral: Negative for depression. The patient is not nervous/anxious     Physical Exam Constitutional: No distress.  Morbidly obese   Neck: Neck supple. No JVD present.  Cardiovascular: Normal rate, regular rhythm and intact distal pulses.   Respiratory: Effort normal and breath sounds normal. No respiratory distress.  GI: Soft. Bowel sounds are normal. She exhibits no distension. There is no tenderness.  Musculoskeletal: She exhibits no edema.  Is able to move all extremities  Is status post right bka   Neurological: She is alert.  Skin: Skin is warm and dry. She is not diaphoretic. Left lower extremity venous ulcers:  Posterior near ankle: 0.6 x 0.5 x 0.1 cm  Lateral wound: 4.2 x 4.1 x 0.1 cm       ASSESSMENT/ PLAN:  1. Chronic diastolic heart failure: is on 1500 cc fluid restriction; will continue  her demadex  20 mg twice daily with k+ 20 meq daily and will monitor her status.   2. Hypertension: will continue  asa 81 mg daily will increase cozaar to 25 mg daily will have nursing check blood pressure every shift  will monitor   3. Diabetes: will continue lantus 18 units daily; her hgb a1c is 6.4; her novolog has been stopped.   4. Peripheral neuropathy: is stable will continue neurontin 600 mg twice daily   5. Gerd: will continue prilosec 20 mg daily   6. Hypomagnesemia: will continue magox 400 mg three times daily   7. Depression: she is stable is receiving benefit from zoloft 50 mg daily   8. 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 Obstructive sleep apnea: she is trying to use cpap on a regular basis at night. I have encouraged her to use this on a regular basis in order to improve her overall health. She did verbalize understanding and stated that she would try to use the machine on a more routine  basis.       Ok Edwards NP [facility]  Contact [phone] Monday through Friday 8am- 5pm  After hours call [phone]

## 2015-04-20 ENCOUNTER — Encounter (HOSPITAL_BASED_OUTPATIENT_CLINIC_OR_DEPARTMENT_OTHER): Payer: Medicare Other | Attending: Internal Medicine

## 2015-04-20 DIAGNOSIS — I890 Lymphedema, not elsewhere classified: Secondary | ICD-10-CM | POA: Diagnosis not present

## 2015-04-20 DIAGNOSIS — G4730 Sleep apnea, unspecified: Secondary | ICD-10-CM | POA: Diagnosis not present

## 2015-04-20 DIAGNOSIS — E1159 Type 2 diabetes mellitus with other circulatory complications: Secondary | ICD-10-CM | POA: Diagnosis not present

## 2015-04-20 DIAGNOSIS — I739 Peripheral vascular disease, unspecified: Secondary | ICD-10-CM | POA: Diagnosis not present

## 2015-04-20 DIAGNOSIS — I509 Heart failure, unspecified: Secondary | ICD-10-CM | POA: Insufficient documentation

## 2015-04-20 DIAGNOSIS — I10 Essential (primary) hypertension: Secondary | ICD-10-CM | POA: Insufficient documentation

## 2015-04-20 DIAGNOSIS — I872 Venous insufficiency (chronic) (peripheral): Secondary | ICD-10-CM | POA: Insufficient documentation

## 2015-04-25 ENCOUNTER — Non-Acute Institutional Stay (SKILLED_NURSING_FACILITY): Payer: Medicare Other | Admitting: Internal Medicine

## 2015-04-25 DIAGNOSIS — F329 Major depressive disorder, single episode, unspecified: Secondary | ICD-10-CM | POA: Diagnosis not present

## 2015-04-25 DIAGNOSIS — E1140 Type 2 diabetes mellitus with diabetic neuropathy, unspecified: Secondary | ICD-10-CM

## 2015-04-25 DIAGNOSIS — I5032 Chronic diastolic (congestive) heart failure: Secondary | ICD-10-CM | POA: Diagnosis not present

## 2015-04-25 DIAGNOSIS — F32A Depression, unspecified: Secondary | ICD-10-CM

## 2015-04-25 DIAGNOSIS — I10 Essential (primary) hypertension: Secondary | ICD-10-CM | POA: Diagnosis not present

## 2015-04-25 DIAGNOSIS — G8929 Other chronic pain: Secondary | ICD-10-CM

## 2015-04-25 DIAGNOSIS — J302 Other seasonal allergic rhinitis: Secondary | ICD-10-CM

## 2015-04-25 DIAGNOSIS — E1149 Type 2 diabetes mellitus with other diabetic neurological complication: Secondary | ICD-10-CM

## 2015-04-26 DIAGNOSIS — E0821 Diabetes mellitus due to underlying condition with diabetic nephropathy: Secondary | ICD-10-CM | POA: Diagnosis not present

## 2015-04-26 DIAGNOSIS — E785 Hyperlipidemia, unspecified: Secondary | ICD-10-CM | POA: Diagnosis not present

## 2015-04-26 DIAGNOSIS — I10 Essential (primary) hypertension: Secondary | ICD-10-CM | POA: Diagnosis not present

## 2015-04-27 ENCOUNTER — Encounter: Payer: Self-pay | Admitting: Internal Medicine

## 2015-04-27 NOTE — Progress Notes (Signed)
Patient ID: Heather Zimmerman. Heather Zimmerman, female   DOB: 08-May-1946, 69 y.o.   MRN: [id number]    DATE: 04/25/15  Location:  [place] [facility] Service: [facility] [phone])   Extended Emergency Contact Information Primary Emergency Contact: Vaughan [place] Phone: [phone] Relation: Relative  Advanced Directive information  FULL CODE  Chief Complaint  Patient presents with  . Medical Management of Chronic Issues    HPI:  69 yo female long term resident seen today for f/u.  She c/o nasal congestion today. No sore throat or dysphagia or odynophagia. No f/c. No nursing issues. No falls. She is a poor historian due to psych issues. Hx obtained from chart.  Chronic diastolic heart failure -  on 1500 cc fluid restriction. Takes demadex with k+ 20 meq daily.  HTN - Bp stable on cozaar. Takes ASA daily   DM/neuropathy - controlled on lantus. Last hgb a1c 6.4%. 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 Followed by wound clinic  Obstructive sleep apnea- uses CPAP most nights.   Seasonal allergy - on zyrtec daily  Past Medical History  Diagnosis Date  . 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 Essential hypertension, benign 12/31/2012  . Chronic diastolic heart failure 04/10/3661  . GERD 12/31/2012  . Unspecified vitamin D deficiency 12/31/2012  . Chronic pain 12/31/2012  . Venous stasis ulcers   . Venous insufficiency (chronic) (peripheral)     Past Surgical History  Procedure Laterality Date  . 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 Marital Status:  Widowed    Spouse Name: N/A  . Number of Children: N/A  . Years of Education: N/A   Occupational History  . Not on file.   Social History Main Topics  . Smoking status: Never Smoker   . Smokeless tobacco: Not on file  . Alcohol Use: No  . Drug Use: Not on file  . Sexual Activity: Not on file   Other Topics Concern  . Not on file   Social History Narrative     reports that she has never smoked. She does not have any smokeless tobacco history on file. She reports that she does not drink alcohol. Her drug history is not on file.  Immunization History  Administered Date(s) Administered  . Influenza,inj,Quad PF,36+ Mos 06/24/2014    Allergies  Allergen Reactions  . Ace Inhibitors     unknown    Medications: Patient's Medications  New Prescriptions   No medications on file  Previous Medications   ACETAMINOPHEN (TYLENOL) 160 MG/5ML SOLUTION    Place 20.3 mLs (650 mg total) into feeding tube every 6 (six) hours as needed for fever.   ASPIRIN 81 MG TABLET    Take 81 mg by mouth daily.   CAMPHOR-MENTHOL (SARNA) LOTION    Apply topically as needed for itching.   CETIRIZINE-PSEUDOEPHEDRINE (ZYRTEC-D) 5-120 MG PER TABLET    Take 1 tablet by mouth 2 (two) times daily.   CHOLECALCIFEROL (VITAMIN D) 1000 UNITS TABLET    Take 2,000 Units by mouth daily.  FLUTICASONE PROPIONATE (FLONASE NA)    Place 1 spray into the nose daily as needed. For nasal congestion   GABAPENTIN (NEURONTIN) 600 MG TABLET    Take 600 mg by mouth 2 (two) times daily.   INSULIN GLARGINE (LANTUS SOLOSTAR) 100 UNIT/ML SOLOSTAR PEN    Inject 18 Units into the skin at bedtime.    IPRATROPIUM-ALBUTEROL (DUONEB) 0.5-2.5 (3) MG/3ML SOLN    Take 3 mLs by nebulization every 6 (six) hours as needed (shortness of breath).   LOSARTAN (COZAAR) 25 MG TABLET    Take 12.5 mg by mouth daily.    MAGNESIUM OXIDE (MAG-OX) 400 MG TABLET    Take 400 mg by mouth every 8 (eight) hours.    OMEPRAZOLE (PRILOSEC) 20 MG CAPSULE    Take  20 mg by mouth daily.   POTASSIUM CHLORIDE SA (K-DUR,KLOR-CON) 20 MEQ TABLET    Take 20 mEq by mouth daily.   SERTRALINE (ZOLOFT) 50 MG TABLET    Take 50 mg by mouth daily.   TORSEMIDE (DEMADEX) 20 MG TABLET    Take 1 tablet (20 mg total) by mouth 2 (two) times daily.  Modified Medications   No medications on file  Discontinued Medications   No medications on file    Review of Systems  Unable to perform ROS: Psychiatric disorder    Filed Vitals:   04/27/15 1302  BP: 120/68  Pulse: 70  Temp: 97.6 F (36.4 C)  Weight: 332 lb (150.594 kg)  SpO2: 95%   Body mass index is 71.82 kg/(m^2).  Physical Exam  Constitutional: She appears well-developed and well-nourished.  Lying in bed in NAD. [place] O2 lying on floor. Morbid obesity  HENT:  Mouth/Throat: Oropharynx is clear and moist. No oropharyngeal exudate.  Eyes: Pupils are equal, round, and reactive to light. No scleral icterus.  Neck: Neck supple. Carotid bruit is not present. No tracheal deviation present. No thyromegaly present.  Cardiovascular: Normal rate, regular rhythm and intact distal pulses.  Exam reveals no gallop and no friction rub.   Murmur (lsb) heard.  Systolic murmur is present with a grade of 1/6  Pulses:      Popliteal pulses are 1+ on the left side.       Dorsalis pedis pulses are 1+ on the left side.       Posterior tibial pulses are 1+ on the left side.  Chronic LLE edema. No calf TTP  Pulmonary/Chest: Effort normal. No stridor. No respiratory distress. She has decreased breath sounds (at base b/l). She has no wheezes. She has no rales.  Abdominal: Soft. Bowel sounds are normal. She exhibits no distension and no mass. There is no hepatomegaly. There is no tenderness. There is no rebound and no guarding.  Musculoskeletal: She exhibits edema and tenderness.  Right bka  Lymphadenopathy:    She has no cervical adenopathy.  Neurological: She is alert.  Skin: Skin is warm and dry. Rash (LLE venous stasis  ulcerspresent. wound dsg c/d/i) noted.  Psychiatric: She has a normal mood and affect. Her behavior is normal. Her speech is slurred.     Labs reviewed: No visits with results within 3 Month(s) from this visit. Latest known visit with results is:  Orders Only on 11/05/2014  Component Date Value Ref Range Status  . Sodium 11/05/2014 147* 135 - 145 mEq/L Final  . Potassium 11/05/2014 3.8  3.5 - 5.3 mEq/L Final  . Chloride 11/05/2014 101  96 - 112 mEq/L Final  . CO2 11/05/2014 36* 19 -  32 mEq/L Final  . Glucose, Bld 11/05/2014 117* 70 - 99 mg/dL Final  . BUN 11/05/2014 36* 6 - 23 mg/dL Final  . Creat 11/05/2014 1.40* 0.50 - 1.10 mg/dL Final  . Calcium 11/05/2014 9.2  8.4 - 10.5 mg/dL Final    No results found.   Assessment/Plan   ICD-9-CM ICD-10-CM   1. Other seasonal allergic rhinitis - reassurance given 477.8 J30.2   2. Chronic diastolic heart failure 539.76 I50.32   3. Essential hypertension, benign 401.1 I10   4. Depression 311 F32.9   5. Type II diabetes mellitus with neurological manifestations 250.60 E11.40   6. Morbid obesity 278.01 E66.01   7. Chronic pain 338.29 G89.29     --check CMP  --Pt is medically stable on current tx plan. Continue current medications as ordered. PT/OT/ST as indicated. Will follow  Jeselle Hiser S. Perlie Gold  [facility] [address] [place], [place] [address] [phone] Cell (Monday-Friday 8 AM - 5 PM) [phone] After 5 PM and follow prompts

## 2015-04-28 DIAGNOSIS — E0822 Diabetes mellitus due to underlying condition with diabetic chronic kidney disease: Secondary | ICD-10-CM | POA: Diagnosis not present

## 2015-04-28 DIAGNOSIS — N184 Chronic kidney disease, stage 4 (severe): Secondary | ICD-10-CM | POA: Diagnosis not present

## 2015-05-02 DIAGNOSIS — I5032 Chronic diastolic (congestive) heart failure: Secondary | ICD-10-CM | POA: Diagnosis not present

## 2015-05-03 DIAGNOSIS — I5033 Acute on chronic diastolic (congestive) heart failure: Secondary | ICD-10-CM | POA: Diagnosis not present

## 2015-05-04 ENCOUNTER — Non-Acute Institutional Stay (SKILLED_NURSING_FACILITY): Payer: Medicare Other | Admitting: Adult Health

## 2015-05-04 DIAGNOSIS — G4733 Obstructive sleep apnea (adult) (pediatric): Secondary | ICD-10-CM

## 2015-05-04 DIAGNOSIS — J9622 Acute and chronic respiratory failure with hypercapnia: Secondary | ICD-10-CM | POA: Diagnosis not present

## 2015-05-04 DIAGNOSIS — I5032 Chronic diastolic (congestive) heart failure: Secondary | ICD-10-CM | POA: Diagnosis not present

## 2015-05-04 NOTE — Progress Notes (Signed)
Patient ID: Heather Zimmerman, female   DOB: 01-06-1946, 69 y.o.   MRN: [id number]    Facility: [facility]      Allergies  Allergen Reactions  . Ace Inhibitors     unknown    Chief Complaint  Patient presents with  . Medical Management of Chronic Issues    HPI:  She is a long term resident of this facility being seen for the management of her chronic illnesses. She is gaining weight. Her weight in April was 322 pounds her current weight is 333 pounds. She does not adhere to her dietary restrictions and her fluid restriction. She does have severe morbid obesity and has chronic shortness of breath.    Past Medical History  Diagnosis Date  . 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 Essential hypertension, benign 12/31/2012  . Chronic diastolic heart failure 06/15/4096  . GERD 12/31/2012  . Unspecified vitamin D deficiency 12/31/2012  . Chronic pain 12/31/2012  . Venous stasis ulcers   . Venous insufficiency (chronic) (peripheral)     Past Surgical History  Procedure Laterality Date  . 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   ASPIRIN 81 MG TABLET    Take 81 mg by mouth daily.   CAMPHOR-MENTHOL (SARNA) LOTION    Apply topically as needed for itching.   CETIRIZINE-PSEUDOEPHEDRINE (ZYRTEC-D) 5-120 MG PER TABLET    Take 1 tablet by mouth 2 (two) times daily.   CHOLECALCIFEROL (VITAMIN D) 1000 UNITS TABLET    Take 2,000 Units by mouth daily.    FLUTICASONE PROPIONATE (FLONASE NA)    Place 1 spray into the nose daily as needed. For nasal congestion    GABAPENTIN (NEURONTIN) 600 MG TABLET    Take 600 mg by mouth 2 (two) times daily.   novolog insulin  3 units prior to meals for cbg >=150   INSULIN GLARGINE (LANTUS SOLOSTAR) 100 UNIT/ML SOLOSTAR PEN    Inject 20 Units into the skin at bedtime.    IPRATROPIUM-ALBUTEROL (DUONEB) 0.5-2.5 (3) MG/3ML SOLN    Take 3 mLs by nebulization every 6 (six) hours as needed (shortness of breath).   LOSARTAN (COZAAR) 25 MG TABLET    Take 12.5 mg by mouth daily.    MAGNESIUM OXIDE (MAG-OX) 400 MG TABLET    Take 400 mg by mouth every 8 (eight) hours.    OMEPRAZOLE (PRILOSEC) 20 MG CAPSULE    Take 20 mg by mouth daily.   POTASSIUM CHLORIDE SA (K-DUR,KLOR-CON) 20 MEQ TABLET    Take 20 mEq by mouth daily.   SERTRALINE (ZOLOFT) 50 MG TABLET    Take 50 mg by mouth daily.   TORSEMIDE (DEMADEX) 20 MG TABLET    Take 1 tablet (20 mg total) by mouth 2 (two) times daily.  Modified Medications   No medications on file  Discontinued Medications   No medications on file     SIGNIFICANT DIAGNOSTIC EXAMS   08-19-14: Chest x-ray: mild pulmonary venous congestion   12-13-14: ABI: on left leg: left intra popliteal stenotic  disease in the minimal ischemia range.    LABS REVIEWED:   08-01-14: glucose 124; bun 21; creat 1.27; k+4.0; na++143; liver normal albumin 3.1; tsh 2.374   hgb a1c 6.7 ;BNP 16.9 08-11-14: hgb a1c 6.6 10-22-14: glucose 93; bun 24; creat 1.30; k+4.5; na++145  11-11-14: glucose 117; bun 36; creat 1.4; k+3.8; na++147  12-14-14: wbc 5.3; hgb 10.7; hct 34.2; mcv 89; plt 204; glucose 88; bun 30; creat 1.3; k+4.4; na++144; liver normal albumin 3.0; pre-albumin 21.1; hgb a1c 6.4; urine micro-albumin 1.0  01-23-15: glucose 132; bun 32; creat 1.56; k+4.2; na++141 01-27-15: glucose 135; bun 34; creat 1.20; k+3.9; na++145 02-03-15: glucose 125; bun 32; creat 1.26; k+4.2; na++143 02-16-15: vit d 34; tsh 2.657  03-10-15: hgb a1c 7.5; pre-albumin 20      Review of Systems  Constitutional: Negative for appetite change  and fatigue.  HENT: Negative for congestion.   Respiratory: Positive for shortness of breath. Negative for cough and chest tightness.   Cardiovascular: Negative for chest pain, palpitations and leg swelling.  Gastrointestinal: Negative for nausea, abdominal pain, diarrhea and constipation.  Musculoskeletal: Negative for myalgias and arthralgias.  Skin: Negative for pallor.       Left leg wounds   Neurological: Negative for dizziness.  Psychiatric/Behavioral: The patient is not nervous/anxious.       Physical Exam Constitutional: No distress.  Morbidly obese   Neck: Neck supple. No JVD present.  Cardiovascular: Normal rate, regular rhythm and intact distal pulses.   Respiratory: Effort normal and breath sounds normal. No respiratory distress.  GI: Soft. Bowel sounds are normal. She exhibits no distension. There is no tenderness.  Musculoskeletal: She exhibits no edema.  Is able to move all extremities  Is status post right bka   Neurological: She is alert.  Skin: Skin is warm and dry. She is not diaphoretic. Left lower extremity venous ulcers: being followed by wound clinic       ASSESSMENT/ PLAN:  1. Chronic diastolic heart failure: is on 1500 cc fluid restriction; will continue  her demadex  20 mg twice daily with k+ 20 meq daily and will monitor her status.   2. Hypertension: will continue  asa 81 mg daily will increase cozaar to 25 mg daily will have nursing check blood pressure every shift  will monitor   3. Diabetes: will continue lantus 18 units daily; her hgb a1c is 7.5; will continue the novolog 3 units prior to meals for cbg >=150 .   4. Peripheral neuropathy: is stable will continue neurontin 600 mg twice daily   5. Gerd: will continue prilosec 20 mg daily   6. Hypomagnesemia: will continue magox 400 mg three times daily   7. Depression: she is stable is receiving benefit from zoloft 50 mg daily   8. Left lower leg ulcers: venous in nature secondary to  diabetes: will continue current treatment and will continue to be followed by wound clinic  9. Obstructive sleep apnea: she is not using  cpap on a regular basis at night. I have encouraged her to use this on a regular basis in order to improve her overall health. She did verbalize understanding and stated that she would try to use the machine on a more routine basis. With her morbid obesity is playing a significant role in her poor respiratory function. We discussed her obesity at length today.     Ok Edwards NP [facility]  Contact [phone] Monday through Friday 8am- 5pm  After hours call [phone]

## 2015-05-12 DIAGNOSIS — I5033 Acute on chronic diastolic (congestive) heart failure: Secondary | ICD-10-CM | POA: Diagnosis not present

## 2015-05-18 ENCOUNTER — Encounter (HOSPITAL_BASED_OUTPATIENT_CLINIC_OR_DEPARTMENT_OTHER): Payer: Medicare Other | Attending: Internal Medicine

## 2015-05-18 DIAGNOSIS — G4733 Obstructive sleep apnea (adult) (pediatric): Secondary | ICD-10-CM | POA: Insufficient documentation

## 2015-05-18 DIAGNOSIS — I890 Lymphedema, not elsewhere classified: Secondary | ICD-10-CM | POA: Diagnosis not present

## 2015-05-18 DIAGNOSIS — I10 Essential (primary) hypertension: Secondary | ICD-10-CM | POA: Diagnosis not present

## 2015-05-18 DIAGNOSIS — F419 Anxiety disorder, unspecified: Secondary | ICD-10-CM | POA: Diagnosis not present

## 2015-05-18 DIAGNOSIS — L97221 Non-pressure chronic ulcer of left calf limited to breakdown of skin: Secondary | ICD-10-CM | POA: Diagnosis not present

## 2015-05-18 DIAGNOSIS — I509 Heart failure, unspecified: Secondary | ICD-10-CM | POA: Insufficient documentation

## 2015-05-18 DIAGNOSIS — I872 Venous insufficiency (chronic) (peripheral): Secondary | ICD-10-CM | POA: Insufficient documentation

## 2015-05-18 DIAGNOSIS — E1159 Type 2 diabetes mellitus with other circulatory complications: Secondary | ICD-10-CM | POA: Insufficient documentation

## 2015-05-30 DIAGNOSIS — E1159 Type 2 diabetes mellitus with other circulatory complications: Secondary | ICD-10-CM | POA: Diagnosis not present

## 2015-05-30 DIAGNOSIS — R262 Difficulty in walking, not elsewhere classified: Secondary | ICD-10-CM | POA: Diagnosis not present

## 2015-05-30 DIAGNOSIS — F329 Major depressive disorder, single episode, unspecified: Secondary | ICD-10-CM | POA: Diagnosis not present

## 2015-05-30 DIAGNOSIS — M79675 Pain in left toe(s): Secondary | ICD-10-CM | POA: Diagnosis not present

## 2015-05-30 DIAGNOSIS — B351 Tinea unguium: Secondary | ICD-10-CM | POA: Diagnosis not present

## 2015-06-15 ENCOUNTER — Encounter (HOSPITAL_BASED_OUTPATIENT_CLINIC_OR_DEPARTMENT_OTHER): Payer: Medicare Other | Attending: Internal Medicine

## 2015-06-15 DIAGNOSIS — I509 Heart failure, unspecified: Secondary | ICD-10-CM | POA: Insufficient documentation

## 2015-06-15 DIAGNOSIS — E11622 Type 2 diabetes mellitus with other skin ulcer: Secondary | ICD-10-CM | POA: Diagnosis not present

## 2015-06-15 DIAGNOSIS — I10 Essential (primary) hypertension: Secondary | ICD-10-CM | POA: Diagnosis not present

## 2015-06-15 DIAGNOSIS — L97821 Non-pressure chronic ulcer of other part of left lower leg limited to breakdown of skin: Secondary | ICD-10-CM | POA: Insufficient documentation

## 2015-06-15 DIAGNOSIS — G4730 Sleep apnea, unspecified: Secondary | ICD-10-CM | POA: Diagnosis not present

## 2015-06-15 DIAGNOSIS — E1151 Type 2 diabetes mellitus with diabetic peripheral angiopathy without gangrene: Secondary | ICD-10-CM | POA: Insufficient documentation

## 2015-06-23 ENCOUNTER — Non-Acute Institutional Stay (SKILLED_NURSING_FACILITY): Payer: Medicare Other | Admitting: Adult Health

## 2015-06-23 DIAGNOSIS — F329 Major depressive disorder, single episode, unspecified: Secondary | ICD-10-CM | POA: Diagnosis not present

## 2015-06-23 DIAGNOSIS — E1149 Type 2 diabetes mellitus with other diabetic neurological complication: Secondary | ICD-10-CM

## 2015-06-23 DIAGNOSIS — G4733 Obstructive sleep apnea (adult) (pediatric): Secondary | ICD-10-CM

## 2015-06-23 DIAGNOSIS — J9622 Acute and chronic respiratory failure with hypercapnia: Secondary | ICD-10-CM | POA: Diagnosis not present

## 2015-06-23 DIAGNOSIS — E1140 Type 2 diabetes mellitus with diabetic neuropathy, unspecified: Secondary | ICD-10-CM | POA: Diagnosis not present

## 2015-06-23 DIAGNOSIS — E1143 Type 2 diabetes mellitus with diabetic autonomic (poly)neuropathy: Secondary | ICD-10-CM | POA: Diagnosis not present

## 2015-06-23 DIAGNOSIS — I10 Essential (primary) hypertension: Secondary | ICD-10-CM | POA: Diagnosis not present

## 2015-06-23 DIAGNOSIS — K2100 Gastro-esophageal reflux disease with esophagitis, without bleeding: Secondary | ICD-10-CM

## 2015-06-23 DIAGNOSIS — F32A Depression, unspecified: Secondary | ICD-10-CM

## 2015-06-23 DIAGNOSIS — I5032 Chronic diastolic (congestive) heart failure: Secondary | ICD-10-CM

## 2015-07-04 DIAGNOSIS — H2513 Age-related nuclear cataract, bilateral: Secondary | ICD-10-CM | POA: Diagnosis not present

## 2015-07-04 DIAGNOSIS — H40013 Open angle with borderline findings, low risk, bilateral: Secondary | ICD-10-CM | POA: Diagnosis not present

## 2015-07-04 DIAGNOSIS — H5213 Myopia, bilateral: Secondary | ICD-10-CM | POA: Diagnosis not present

## 2015-07-04 DIAGNOSIS — E119 Type 2 diabetes mellitus without complications: Secondary | ICD-10-CM | POA: Diagnosis not present

## 2015-07-17 ENCOUNTER — Encounter: Payer: Self-pay | Admitting: Adult Health

## 2015-07-17 ENCOUNTER — Emergency Department (HOSPITAL_COMMUNITY): Payer: Medicare Other

## 2015-07-17 ENCOUNTER — Encounter (HOSPITAL_COMMUNITY): Payer: Self-pay | Admitting: Emergency Medicine

## 2015-07-17 ENCOUNTER — Inpatient Hospital Stay (HOSPITAL_COMMUNITY)
Admission: EM | Admit: 2015-07-17 | Discharge: 2015-07-21 | DRG: 871 | Disposition: A | Discharge status: Skilled Nursing Facility | Payer: Medicare Other | Attending: Internal Medicine | Admitting: Internal Medicine

## 2015-07-17 ENCOUNTER — Other Ambulatory Visit: Payer: Self-pay | Admitting: Internal Medicine

## 2015-07-17 ENCOUNTER — Non-Acute Institutional Stay (SKILLED_NURSING_FACILITY): Payer: Medicare Other | Admitting: Adult Health

## 2015-07-17 DIAGNOSIS — J9600 Acute respiratory failure, unspecified whether with hypoxia or hypercapnia: Secondary | ICD-10-CM | POA: Diagnosis present

## 2015-07-17 DIAGNOSIS — E662 Morbid (severe) obesity with alveolar hypoventilation: Secondary | ICD-10-CM | POA: Diagnosis present

## 2015-07-17 DIAGNOSIS — I959 Hypotension, unspecified: Secondary | ICD-10-CM | POA: Diagnosis present

## 2015-07-17 DIAGNOSIS — J9622 Acute and chronic respiratory failure with hypercapnia: Secondary | ICD-10-CM | POA: Diagnosis not present

## 2015-07-17 DIAGNOSIS — B9562 Methicillin resistant Staphylococcus aureus infection as the cause of diseases classified elsewhere: Secondary | ICD-10-CM | POA: Diagnosis not present

## 2015-07-17 DIAGNOSIS — N189 Chronic kidney disease, unspecified: Secondary | ICD-10-CM | POA: Diagnosis not present

## 2015-07-17 DIAGNOSIS — N183 Chronic kidney disease, stage 3 unspecified: Secondary | ICD-10-CM | POA: Diagnosis present

## 2015-07-17 DIAGNOSIS — I5032 Chronic diastolic (congestive) heart failure: Secondary | ICD-10-CM

## 2015-07-17 DIAGNOSIS — R4182 Altered mental status, unspecified: Secondary | ICD-10-CM | POA: Diagnosis not present

## 2015-07-17 DIAGNOSIS — I10 Essential (primary) hypertension: Secondary | ICD-10-CM | POA: Diagnosis present

## 2015-07-17 DIAGNOSIS — Z794 Long term (current) use of insulin: Secondary | ICD-10-CM

## 2015-07-17 DIAGNOSIS — G92 Toxic encephalopathy: Secondary | ICD-10-CM | POA: Diagnosis present

## 2015-07-17 DIAGNOSIS — E11622 Type 2 diabetes mellitus with other skin ulcer: Secondary | ICD-10-CM | POA: Diagnosis present

## 2015-07-17 DIAGNOSIS — F329 Major depressive disorder, single episode, unspecified: Secondary | ICD-10-CM | POA: Diagnosis present

## 2015-07-17 DIAGNOSIS — A419 Sepsis, unspecified organism: Principal | ICD-10-CM

## 2015-07-17 DIAGNOSIS — E1142 Type 2 diabetes mellitus with diabetic polyneuropathy: Secondary | ICD-10-CM | POA: Diagnosis present

## 2015-07-17 DIAGNOSIS — N179 Acute kidney failure, unspecified: Secondary | ICD-10-CM | POA: Diagnosis not present

## 2015-07-17 DIAGNOSIS — E1151 Type 2 diabetes mellitus with diabetic peripheral angiopathy without gangrene: Secondary | ICD-10-CM | POA: Diagnosis present

## 2015-07-17 DIAGNOSIS — Z452 Encounter for adjustment and management of vascular access device: Secondary | ICD-10-CM

## 2015-07-17 DIAGNOSIS — R339 Retention of urine, unspecified: Secondary | ICD-10-CM | POA: Diagnosis present

## 2015-07-17 DIAGNOSIS — J9610 Chronic respiratory failure, unspecified whether with hypoxia or hypercapnia: Secondary | ICD-10-CM | POA: Diagnosis not present

## 2015-07-17 DIAGNOSIS — J1100 Influenza due to unidentified influenza virus with unspecified type of pneumonia: Secondary | ICD-10-CM | POA: Diagnosis present

## 2015-07-17 DIAGNOSIS — L03116 Cellulitis of left lower limb: Secondary | ICD-10-CM | POA: Diagnosis present

## 2015-07-17 DIAGNOSIS — G8929 Other chronic pain: Secondary | ICD-10-CM | POA: Diagnosis not present

## 2015-07-17 DIAGNOSIS — J9811 Atelectasis: Secondary | ICD-10-CM | POA: Diagnosis not present

## 2015-07-17 DIAGNOSIS — G9340 Encephalopathy, unspecified: Secondary | ICD-10-CM | POA: Diagnosis not present

## 2015-07-17 DIAGNOSIS — E870 Hyperosmolality and hypernatremia: Secondary | ICD-10-CM | POA: Diagnosis not present

## 2015-07-17 DIAGNOSIS — J189 Pneumonia, unspecified organism: Secondary | ICD-10-CM

## 2015-07-17 DIAGNOSIS — I83029 Varicose veins of left lower extremity with ulcer of unspecified site: Secondary | ICD-10-CM | POA: Diagnosis not present

## 2015-07-17 DIAGNOSIS — G4733 Obstructive sleep apnea (adult) (pediatric): Secondary | ICD-10-CM

## 2015-07-17 DIAGNOSIS — E1143 Type 2 diabetes mellitus with diabetic autonomic (poly)neuropathy: Secondary | ICD-10-CM

## 2015-07-17 DIAGNOSIS — I129 Hypertensive chronic kidney disease with stage 1 through stage 4 chronic kidney disease, or unspecified chronic kidney disease: Secondary | ICD-10-CM | POA: Diagnosis present

## 2015-07-17 DIAGNOSIS — L97929 Non-pressure chronic ulcer of unspecified part of left lower leg with unspecified severity: Secondary | ICD-10-CM

## 2015-07-17 DIAGNOSIS — D6949 Other primary thrombocytopenia: Secondary | ICD-10-CM | POA: Diagnosis not present

## 2015-07-17 DIAGNOSIS — F419 Anxiety disorder, unspecified: Secondary | ICD-10-CM | POA: Diagnosis not present

## 2015-07-17 DIAGNOSIS — S81802A Unspecified open wound, left lower leg, initial encounter: Secondary | ICD-10-CM

## 2015-07-17 DIAGNOSIS — R509 Fever, unspecified: Secondary | ICD-10-CM

## 2015-07-17 DIAGNOSIS — Z9119 Patient's noncompliance with other medical treatment and regimen: Secondary | ICD-10-CM | POA: Diagnosis not present

## 2015-07-17 DIAGNOSIS — R0602 Shortness of breath: Secondary | ICD-10-CM | POA: Diagnosis not present

## 2015-07-17 DIAGNOSIS — Z89519 Acquired absence of unspecified leg below knee: Secondary | ICD-10-CM | POA: Diagnosis not present

## 2015-07-17 DIAGNOSIS — I87312 Chronic venous hypertension (idiopathic) with ulcer of left lower extremity: Secondary | ICD-10-CM | POA: Diagnosis not present

## 2015-07-17 DIAGNOSIS — I872 Venous insufficiency (chronic) (peripheral): Secondary | ICD-10-CM | POA: Diagnosis present

## 2015-07-17 DIAGNOSIS — J9620 Acute and chronic respiratory failure, unspecified whether with hypoxia or hypercapnia: Secondary | ICD-10-CM | POA: Diagnosis present

## 2015-07-17 DIAGNOSIS — E118 Type 2 diabetes mellitus with unspecified complications: Secondary | ICD-10-CM | POA: Diagnosis not present

## 2015-07-17 DIAGNOSIS — E8770 Fluid overload, unspecified: Secondary | ICD-10-CM | POA: Diagnosis not present

## 2015-07-17 DIAGNOSIS — E119 Type 2 diabetes mellitus without complications: Secondary | ICD-10-CM | POA: Diagnosis not present

## 2015-07-17 DIAGNOSIS — R6521 Severe sepsis with septic shock: Secondary | ICD-10-CM | POA: Diagnosis not present

## 2015-07-17 DIAGNOSIS — Z89511 Acquired absence of right leg below knee: Secondary | ICD-10-CM | POA: Diagnosis not present

## 2015-07-17 DIAGNOSIS — E1149 Type 2 diabetes mellitus with other diabetic neurological complication: Secondary | ICD-10-CM

## 2015-07-17 DIAGNOSIS — J9602 Acute respiratory failure with hypercapnia: Secondary | ICD-10-CM | POA: Diagnosis not present

## 2015-07-17 DIAGNOSIS — Z7982 Long term (current) use of aspirin: Secondary | ICD-10-CM

## 2015-07-17 DIAGNOSIS — K219 Gastro-esophageal reflux disease without esophagitis: Secondary | ICD-10-CM | POA: Diagnosis present

## 2015-07-17 DIAGNOSIS — E1122 Type 2 diabetes mellitus with diabetic chronic kidney disease: Secondary | ICD-10-CM | POA: Diagnosis present

## 2015-07-17 DIAGNOSIS — E559 Vitamin D deficiency, unspecified: Secondary | ICD-10-CM | POA: Diagnosis not present

## 2015-07-17 DIAGNOSIS — L97229 Non-pressure chronic ulcer of left calf with unspecified severity: Secondary | ICD-10-CM | POA: Diagnosis not present

## 2015-07-17 DIAGNOSIS — J810 Acute pulmonary edema: Secondary | ICD-10-CM | POA: Diagnosis not present

## 2015-07-17 DIAGNOSIS — D649 Anemia, unspecified: Secondary | ICD-10-CM | POA: Diagnosis not present

## 2015-07-17 HISTORY — DX: Reserved for inherently not codable concepts without codable children: IMO0001

## 2015-07-17 HISTORY — DX: Type 2 diabetes mellitus without complications: E11.9

## 2015-07-17 LAB — COMPREHENSIVE METABOLIC PANEL
ALT: 20 U/L (ref 14–54)
ANION GAP: 9 (ref 5–15)
AST: 24 U/L (ref 15–41)
Albumin: 2.8 g/dL — ABNORMAL LOW (ref 3.5–5.0)
Alkaline Phosphatase: 78 U/L (ref 38–126)
BUN: 25 mg/dL — ABNORMAL HIGH (ref 6–20)
CHLORIDE: 98 mmol/L — AB (ref 101–111)
CO2: 34 mmol/L — AB (ref 22–32)
CREATININE: 1.67 mg/dL — AB (ref 0.44–1.00)
Calcium: 8.9 mg/dL (ref 8.9–10.3)
GFR, EST AFRICAN AMERICAN: 35 mL/min — AB (ref >60)
GFR, EST NON AFRICAN AMERICAN: 30 mL/min — AB (ref >60)
Glucose, Bld: 208 mg/dL — ABNORMAL HIGH (ref 65–99)
Potassium: 4.4 mmol/L (ref 3.5–5.1)
SODIUM: 141 mmol/L (ref 135–145)
Total Bilirubin: 0.9 mg/dL (ref 0.3–1.2)
Total Protein: 7.5 g/dL (ref 6.5–8.1)

## 2015-07-17 LAB — CBC
HCT: 39.6 % (ref 36.0–46.0)
Hemoglobin: 12 g/dL (ref 12.0–15.0)
MCH: 28.3 pg (ref 26.0–34.0)
MCHC: 30.3 g/dL (ref 30.0–36.0)
MCV: 93.4 fL (ref 78.0–100.0)
PLATELETS: 185 10*3/uL (ref 150–400)
RBC: 4.24 MIL/uL (ref 3.87–5.11)
RDW: 15.9 % — AB (ref 11.5–15.5)
WBC: 17.7 10*3/uL — ABNORMAL HIGH (ref 4.0–10.5)

## 2015-07-17 LAB — URINALYSIS, ROUTINE W REFLEX MICROSCOPIC
BILIRUBIN URINE: NEGATIVE
Glucose, UA: NEGATIVE mg/dL
KETONES UR: NEGATIVE mg/dL
Leukocytes, UA: NEGATIVE
Nitrite: NEGATIVE
PH: 8 (ref 5.0–8.0)
Protein, ur: NEGATIVE mg/dL
SPECIFIC GRAVITY, URINE: 1.01 (ref 1.005–1.030)
UROBILINOGEN UA: 0.2 mg/dL (ref 0.0–1.0)

## 2015-07-17 LAB — I-STAT ARTERIAL BLOOD GAS, ED
ACID-BASE EXCESS: 11 mmol/L — AB (ref 0.0–2.0)
Bicarbonate: 38.1 mEq/L — ABNORMAL HIGH (ref 20.0–24.0)
O2 Saturation: 94 %
PCO2 ART: 63.5 mmHg — AB (ref 35.0–45.0)
PH ART: 7.386 (ref 7.350–7.450)
PO2 ART: 74 mmHg — AB (ref 80.0–100.0)
TCO2: 40 mmol/L (ref 0–100)

## 2015-07-17 LAB — I-STAT TROPONIN, ED: TROPONIN I, POC: 0 ng/mL (ref 0.00–0.08)

## 2015-07-17 LAB — I-STAT CG4 LACTIC ACID, ED: LACTIC ACID, VENOUS: 1.07 mmol/L (ref 0.5–2.0)

## 2015-07-17 LAB — URINE MICROSCOPIC-ADD ON

## 2015-07-17 MED ORDER — ACETAMINOPHEN 500 MG PO TABS
1000.0000 mg | ORAL_TABLET | Freq: Once | ORAL | Status: AC
  Administered 2015-07-17: 1000 mg via ORAL
  Filled 2015-07-17: qty 2

## 2015-07-17 MED ORDER — VANCOMYCIN HCL 10 G IV SOLR
2500.0000 mg | Freq: Once | INTRAVENOUS | Status: AC
  Administered 2015-07-17: 2500 mg via INTRAVENOUS
  Filled 2015-07-17: qty 2500

## 2015-07-17 MED ORDER — VANCOMYCIN HCL 10 G IV SOLR: 2000.0000 mg | Freq: Once | INTRAVENOUS | Status: DC

## 2015-07-17 MED ORDER — SODIUM CHLORIDE 0.9 % IV BOLUS (SEPSIS)
1000.0000 mL | INTRAVENOUS | Status: AC
  Administered 2015-07-17: 1000 mL via INTRAVENOUS

## 2015-07-17 MED ORDER — PIPERACILLIN-TAZOBACTAM 3.375 G IVPB 30 MIN
3.3750 g | Freq: Once | INTRAVENOUS | Status: AC
  Administered 2015-07-17: 3.375 g via INTRAVENOUS
  Filled 2015-07-17: qty 50

## 2015-07-17 MED ORDER — INSULIN ASPART 100 UNIT/ML ~~LOC~~ SOLN: 5.0000 [IU] | Freq: Three times a day (TID) | SUBCUTANEOUS | Status: DC

## 2015-07-17 MED ORDER — IPRATROPIUM-ALBUTEROL 0.5-2.5 (3) MG/3ML IN SOLN
3.0000 mL | Freq: Once | RESPIRATORY_TRACT | Status: AC
  Administered 2015-07-17: 3 mL via RESPIRATORY_TRACT
  Filled 2015-07-17: qty 3

## 2015-07-17 NOTE — ED Notes (Signed)
Attempted report 

## 2015-07-17 NOTE — Progress Notes (Signed)
Patient ID: Heather Zimmerman, female   DOB: April 18, 1946, 69 y.o.   MRN: [id number]    Facility: [facility]      Allergies  Allergen Reactions  . Ace Inhibitors     unknown    Chief Complaint  Patient presents with  . Acute Visit    resp. status     HPI:  She has been gaining weight over the past several weeks to months with a current weight of 334 pounds. She is unable to adhere to her fluid restriction. She has increased shortness of breath present stating that she cannot breath with her head of bed down.     Past Medical History  Diagnosis Date  . 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 Essential hypertension, benign 12/31/2012  . Chronic diastolic heart failure ([place]) 12/31/2012  . GERD 12/31/2012  . Unspecified vitamin D deficiency 12/31/2012  . Chronic pain 12/31/2012  . Venous stasis ulcers ([place])   . Venous insufficiency (chronic) (peripheral)     Past Surgical History  Procedure Laterality Date  . 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   ASPIRIN 81 MG TABLET    Take 81 mg by mouth daily.   CAMPHOR-MENTHOL (SARNA) LOTION    Apply topically as needed for itching.   CETIRIZINE-PSEUDOEPHEDRINE (ZYRTEC-D) 5-120 MG PER TABLET    Take 1 tablet by mouth 2 (two) times daily.   DULOXETINE (CYMBALTA) 20 MG CAPSULE    Take 20 mg by mouth daily.   FLUTICASONE PROPIONATE (FLONASE NA)    Place 1 spray into the nose daily as needed. For nasal congestion   GABAPENTIN (NEURONTIN) 600 MG TABLET    Take 600 mg by mouth 2 (two)  times daily.   INSULIN ASPART (NOVOLOG) 100 UNIT/ML INJECTION    Inject 3 Units into the skin 3 (three) times daily before meals. for cbg >150   INSULIN GLARGINE (LANTUS SOLOSTAR) 100 UNIT/ML SOLOSTAR PEN    Inject 20 Units into the skin at bedtime.    IPRATROPIUM-ALBUTEROL (DUONEB) 0.5-2.5 (3) MG/3ML SOLN    Take 3 mLs by nebulization every 6 (six) hours as needed (shortness of breath).   LOSARTAN (COZAAR) 25 MG TABLET    Take 25 mg by mouth daily.    MAGNESIUM OXIDE (MAG-OX) 400 MG TABLET    Take 400 mg by mouth every 8 (eight) hours.    OMEPRAZOLE (PRILOSEC) 20 MG CAPSULE    Take 20 mg by mouth daily.   POTASSIUM CHLORIDE SA (K-DUR,KLOR-CON) 20 MEQ TABLET    Take 20 mEq by mouth daily.   TORSEMIDE (DEMADEX) 20 MG TABLET    Take 1 tablet (20 mg total) by mouth 2 (two) times daily.  Modified Medications   No medications on file  Discontinued Medications     SIGNIFICANT DIAGNOSTIC EXAMS  08-19-14: Chest x-ray: mild pulmonary venous congestion   12-13-14: ABI: on left leg: left intra popliteal stenotic disease in the minimal ischemia range.   05-02-15: chest x-ray: no acute  disease process    LABS REVIEWED:   08-01-14: glucose 124; bun 21; creat 1.27; k+4.0; na++143; liver normal albumin 3.1; tsh 2.374   hgb a1c 6.7 ;BNP 16.9 08-11-14: hgb a1c 6.6 10-22-14: glucose 93; bun 24; creat 1.30; k+4.5; na++145  11-11-14: glucose 117; bun 36; creat 1.4; k+3.8; na++147  12-14-14: wbc 5.3; hgb 10.7; hct 34.2; mcv 89; plt 204; glucose 88; bun 30; creat 1.3; k+4.4; na++144; liver normal albumin 3.0; pre-albumin 21.1; hgb a1c 6.4; urine micro-albumin 1.0  01-23-15: glucose 132; bun 32; creat 1.56; k+4.2; na++141 01-27-15: glucose 135; bun 34; creat 1.20; k+3.9; na++145 02-03-15: glucose 125; bun 32; creat 1.26; k+4.2; na++143 02-16-15: vit d 34; tsh 2.657  03-10-15: hgb a1c 7.5; pre-albumin 20  05-03-15: wbc 6.1; hgb 11.7; hct 37.1; mcv 90.3; plt 205; glucose 171; bun 31; creat 1.44; k+ 4.7; na++146; liver  normal albumin 3.0     Review of Systems Constitutional: Negative for appetite change and fatigue.  HENT: Negative for congestion.   Respiratory: Positive for shortness of breath. Negative for cough and chest tightness.   Cardiovascular: Negative for chest pain, palpitations and leg swelling.  Gastrointestinal: Negative for nausea, abdominal pain, diarrhea and constipation.  Musculoskeletal: Negative for myalgias and arthralgias.  Skin: Negative for pallor.       Left leg wounds   Neurological: Negative for dizziness.  Psychiatric/Behavioral: The patient is not nervous/anxious.       Physical Exam Constitutional: No distress.  Morbidly obese   Neck: Neck supple. No JVD present.  Cardiovascular: Normal rate, regular rhythm and intact distal pulses.   Respiratory: breath sounds diminished has increase respiratory effort  GI: Soft. Bowel sounds are normal. She exhibits no distension. There is no tenderness.  Musculoskeletal: She exhibits no edema.  Is able to move all extremities  Is status post right bka   Neurological: She is alert.  Skin: Skin is warm and dry. She is not diaphoretic. Left lower extremity venous ulcers: being followed by wound clinic       ASSESSMENT/ PLAN:  1. Chronic diastolic heart failure: is on 1500 cc fluid restriction; will increase demadex to 40 mg in the AM and 20 mg in the PM  with k+ 20 meq daily and will monitor her status.   2. Obstructive sleep apnea: she is not using  cpap on a regular basis at night. I have encouraged her to use this on a regular basis in order to improve her overall health. She did verbalize understanding and stated that she would try to use the machine on a more routine basis. With her morbid obesity is playing a significant role in her poor respiratory function. We discussed her obesity at length today.  3. Chronic respiratory failure: will begin advair 250/50 twice daily will setup pulmonology consult will monitor  Will  check bmp in 2 weeks.       Ok Edwards NP [facility]  Contact [phone] Monday through Friday 8am- 5pm  After hours call [phone]

## 2015-07-17 NOTE — ED Notes (Signed)
Pt to ED via [facility] from [facility] with c/o elevated temp since this am

## 2015-07-17 NOTE — Progress Notes (Signed)
Patient ID: Heather Zimmerman. [name], female   DOB: 05-03-1946, 69 y.o.   MRN: [id number]    Facility: [facility]      Allergies  Allergen Reactions  . Ace Inhibitors     unknown    Chief Complaint  Patient presents with  . Medical Management of Chronic Issues  . Acute Visit    patient status     HPI:  She is a long term resident of this facility being seen for the management of her chronic illnesses. She has spiked a fever today of 102.2 with a b/p of 72/42.  She is alert states that she is feeling bad. Her leg wounds are without signs of infection present. Her current weight is stable at 331 pounds.    Past Medical History  Diagnosis Date  . 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 Essential hypertension, benign 12/31/2012  . Chronic diastolic heart failure ([place]) 12/31/2012  . GERD 12/31/2012  . Unspecified vitamin D deficiency 12/31/2012  . Chronic pain 12/31/2012  . Venous stasis ulcers ([place])   . Venous insufficiency (chronic) (peripheral)     Past Surgical History  Procedure Laterality Date  . Leg amputation below knee Right 01/03/2012    VITAL SIGNS BP 72/41 mmHg  Pulse 100  Temp(Src) 102.2 F (39 C)  Resp 16  SpO2 92%  Patient's Medications  New Prescriptions   No medications on file  Previous Medications   ACETAMINOPHEN (TYLENOL) 160 MG/5ML SOLUTION    Place 20.3 mLs (650 mg total) into feeding tube every 6 (six) hours as needed for fever.   ASPIRIN 81 MG TABLET    Take 81 mg by mouth daily.   CAMPHOR-MENTHOL (SARNA) LOTION    Apply topically as needed for itching.   CETIRIZINE-PSEUDOEPHEDRINE (ZYRTEC-D) 5-120 MG PER TABLET    Take 1 tablet by mouth 2 (two) times daily.   CHOLECALCIFEROL (VITAMIN D) 1000 UNITS TABLET    Take 2,000 Units by mouth daily.   DULOXETINE (CYMBALTA) 20 MG CAPSULE    Take 40 mg by mouth daily.    FLUTICASONE PROPIONATE (FLONASE NA)     Place 1 spray into the nose daily as needed. For nasal congestion   FLUTICASONE-SALMETEROL (ADVAIR) 250-50 MCG/DOSE AEPB    Inhale 1 puff into the lungs 2 (two) times daily.   GABAPENTIN (NEURONTIN) 600 MG TABLET    Take 600 mg by mouth 2 (two) times daily.   INSULIN ASPART (NOVOLOG) 100 UNIT/ML INJECTION    Inject 5 Units into the skin 3 (three) times daily before meals. With an additional 5 units for cg >=150   INSULIN GLARGINE (LANTUS SOLOSTAR) 100 UNIT/ML SOLOSTAR PEN    Inject 20 Units into the skin at bedtime.    IPRATROPIUM-ALBUTEROL (DUONEB) 0.5-2.5 (3) MG/3ML SOLN    Take 3 mLs by nebulization every 6 (six) hours as needed (shortness of breath).   LOSARTAN (COZAAR) 25 MG TABLET    Take 25 mg by mouth daily.    MAGNESIUM OXIDE (MAG-OX) 400 MG TABLET    Take 400 mg by mouth every 8 (eight) hours.    OMEPRAZOLE (PRILOSEC) 20 MG CAPSULE    Take 20 mg by mouth daily.   POTASSIUM CHLORIDE SA (K-DUR,KLOR-CON) 20 MEQ TABLET    Take 20 mEq by mouth daily.   TORSEMIDE (DEMADEX) 20 MG TABLET    Take 1 tablet (20 mg total) by mouth 2 (two) times daily.  Modified Medications   No medications on file  Discontinued Medications   No medications on file     SIGNIFICANT DIAGNOSTIC EXAMS  08-19-14: Chest x-ray: mild pulmonary venous congestion   12-13-14: ABI: on left leg: left intra popliteal stenotic disease in the minimal ischemia range.   05-02-15: chest x-ray: no acute disease process   07-17-15: chest x-ray: no acute cardiopulmonary process    LABS REVIEWED:   08-01-14: glucose 124; bun 21; creat 1.27; k+4.0; na++143; liver normal albumin 3.1; tsh 2.374   hgb a1c 6.7 ;BNP 16.9 08-11-14: hgb a1c 6.6 10-22-14: glucose 93; bun 24; creat 1.30; k+4.5; na++145  11-11-14: glucose 117; bun 36; creat 1.4; k+3.8; na++147  12-14-14: wbc 5.3; hgb 10.7; hct 34.2; mcv 89; plt 204; glucose 88; bun 30; creat 1.3; k+4.4; na++144; liver normal albumin 3.0; pre-albumin 21.1; hgb a1c 6.4; urine micro-albumin 1.0    01-23-15: glucose 132; bun 32; creat 1.56; k+4.2; na++141 01-27-15: glucose 135; bun 34; creat 1.20; k+3.9; na++145 02-03-15: glucose 125; bun 32; creat 1.26; k+4.2; na++143 02-16-15: vit d 34; tsh 2.657  03-10-15: hgb a1c 7.5; pre-albumin 20  05-03-15: wbc 6.1; hgb 11.7; hct 37.1; mcv 90.3; plt 205; glucose 171; bun 31; creat 1.44; k+ 4.7; na++146; liver normal albumin 3.0  07-17-15: wbc 14,1; hgb 11.7; hct 36.3; mcv 88.3; plt 195; glucose 213; bun 28; creat 1.46; k+ 4.4; na++144; liver normal albumin 3.2   Granulocytes 90; absolute gran 12.7      Review of Systems  Unable to perform ROS: Acuity of condition      Physical Exam Constitutional: No distress.  Morbidly obese   Neck: Neck supple. No JVD present.  Cardiovascular: Normal rate, regular rhythm and intact distal pulses.   Respiratory: breath sounds diminished has increase respiratory effort  GI: Soft. Bowel sounds are normal. She exhibits no distension. There is no tenderness.  Musculoskeletal: She exhibits no edema.  Is able to move all extremities  Is status post right bka   Neurological: She is alert.  Skin: Skin is warm and dry. She is not diaphoretic. Left lower extremity venous ulcers: no signs of infection present.  being followed by wound clinic      ASSESSMENT/ PLAN:  1. Chronic diastolic heart failure: is on 1500 cc fluid restriction; will continue  her demadex  40 mg in the AM and 20 mg in the PM  with k+ 20 meq daily and will monitor her status.   2. Hypertension: will continue  asa 81 mg daily  cozaar to 25 mg daily  Will hold for systolic b/p <762  3. Diabetes: will continue lantus 20 units daily; will change the novolog to 5 units with meals with an additional 5 units for cbg >=150 her hgb a1c is 7.5;   4. Peripheral neuropathy: is stable will continue neurontin 600 mg twice daily   5. Gerd: will continue prilosec 20 mg daily   6. Hypomagnesemia: will continue magox 400 mg three times daily   7.  Depression: she is stable is receiving benefit from zoloft 50 mg daily   8. Left lower leg ulcers: venous in nature secondary to diabetes: will continue current treatment and will continue to be followed by wound clinic  9. Obstructive sleep apnea: she is not using  cpap on a regular basis at night. I continue to encourage her to use this on a regular basis in order to improve her overall health. She did verbalize understanding and stated that she would  try to use the machine on a more routine basis. With her morbid obesity is playing a significant role in her poor respiratory function. We discussed her obesity at length today.  10. Chronic respiratory failure with morbid obesity:  is without change is 02 dependent; will continue advair 250/50 twice daily   She declined pulmunology consult   11. Left lower leg venous stasis ulcer: will continue current treatment and will monitor her status.   12. Pneumonia: has an elevated wbc at 14.1. Her chest x-ray does not indicate any disease process; however; with her morbid obesity an infectious process may not be seen on a film. Will begin her on zosyn 3.375 gm every 6 hours for 10 days and will monitor her status.     Time spent with patient  50  minutes >50% time spent counseling; reviewing medical record; tests; labs; and developing future plan of care    Ok Edwards NP [facility]  Contact [phone] Monday through Friday 8am- 5pm  After hours call [phone]

## 2015-07-17 NOTE — Progress Notes (Signed)
ABG results given to RN. 

## 2015-07-17 NOTE — Progress Notes (Signed)
Patient ID: Heather Zimmerman, female   DOB: 1946-08-18, 69 y.o.   MRN: [id number]    Facility: [facility]      Allergies  Allergen Reactions  . Ace Inhibitors     unknown    Chief Complaint  Patient presents with  . Medical Management of Chronic Issues    HPI:  She is a long term resident of this facility being seen for the management of her chronic illnesses. Her cbg's have been between 150-280 requiring insulin at all meals. She is not voicing any complaints at this time. There has been no significant change in her status overall.    Past Medical History  Diagnosis Date  . 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 Essential hypertension, benign 12/31/2012  . Chronic diastolic heart failure ([place]) 12/31/2012  . GERD 12/31/2012  . Unspecified vitamin D deficiency 12/31/2012  . Chronic pain 12/31/2012  . Venous stasis ulcers ([place])   . Venous insufficiency (chronic) (peripheral)     Past Surgical History  Procedure Laterality Date  . Leg amputation below knee Right 01/03/2012    VITAL SIGNS BP 127/68 mmHg  Pulse 86  Ht 4\' 9"  (1.448 m)  Wt 330 lb (149.687 kg)  BMI 71.39 kg/m2  SpO2 98%  Patient's Medications  New Prescriptions   No medications on file  Previous Medications   ACETAMINOPHEN (TYLENOL) 160 MG/5ML SOLUTION    Place 20.3 mLs (650 mg total) into feeding tube every 6 (six) hours as needed for fever.   ASPIRIN 81 MG TABLET    Take 81 mg by mouth daily.   CAMPHOR-MENTHOL (SARNA) LOTION    Apply topically as needed for itching.   CETIRIZINE-PSEUDOEPHEDRINE (ZYRTEC-D) 5-120 MG PER TABLET    Take 1 tablet by mouth 2 (two) times daily.   CHOLECALCIFEROL (VITAMIN D) 1000 UNITS TABLET    Take 2,000 Units by mouth daily.   DULOXETINE (CYMBALTA) 20 MG CAPSULE    Take 40 mg by mouth daily.    FLUTICASONE PROPIONATE (FLONASE NA)    Place 1 spray into the nose daily as  needed. For nasal congestion   FLUTICASONE-SALMETEROL (ADVAIR) 250-50 MCG/DOSE AEPB    Inhale 1 puff into the lungs 2 (two) times daily.   GABAPENTIN (NEURONTIN) 600 MG TABLET    Take 600 mg by mouth 2 (two) times daily.   INSULIN ASPART (NOVOLOG) 100 UNIT/ML INJECTION    Inject 3 Units into the skin 3 (three) times daily before meals. for cbg >150   INSULIN GLARGINE (LANTUS SOLOSTAR) 100 UNIT/ML SOLOSTAR PEN    Inject 20 Units into the skin at bedtime.    IPRATROPIUM-ALBUTEROL (DUONEB) 0.5-2.5 (3) MG/3ML SOLN    Take 3 mLs by nebulization every 6 (six) hours as needed (shortness of breath).   LOSARTAN (COZAAR) 25 MG TABLET    Take 25 mg by mouth daily.    MAGNESIUM OXIDE (MAG-OX) 400 MG TABLET    Take 400 mg by mouth every 8 (eight) hours.    OMEPRAZOLE (PRILOSEC) 20 MG CAPSULE    Take 20 mg by mouth daily.   POTASSIUM CHLORIDE SA (K-DUR,KLOR-CON) 20 MEQ TABLET    Take 20 mEq by mouth daily.   TORSEMIDE (DEMADEX) 20 MG TABLET    Take 1 tablet (20 mg total) by mouth 2 (two) times daily.  Modified Medications   No medications on file  Discontinued Medications   No medications on file  SIGNIFICANT DIAGNOSTIC EXAMS  08-19-14: Chest x-ray: mild pulmonary venous congestion   12-13-14: ABI: on left leg: left intra popliteal stenotic disease in the minimal ischemia range.   05-02-15: chest x-ray: no acute disease process    LABS REVIEWED:   08-01-14: glucose 124; bun 21; creat 1.27; k+4.0; na++143; liver normal albumin 3.1; tsh 2.374   hgb a1c 6.7 ;BNP 16.9 08-11-14: hgb a1c 6.6 10-22-14: glucose 93; bun 24; creat 1.30; k+4.5; na++145  11-11-14: glucose 117; bun 36; creat 1.4; k+3.8; na++147  12-14-14: wbc 5.3; hgb 10.7; hct 34.2; mcv 89; plt 204; glucose 88; bun 30; creat 1.3; k+4.4; na++144; liver normal albumin 3.0; pre-albumin 21.1; hgb a1c 6.4; urine micro-albumin 1.0  01-23-15: glucose 132; bun 32; creat 1.56; k+4.2; na++141 01-27-15: glucose 135; bun 34; creat 1.20; k+3.9;  na++145 02-03-15: glucose 125; bun 32; creat 1.26; k+4.2; na++143 02-16-15: vit d 34; tsh 2.657  03-10-15: hgb a1c 7.5; pre-albumin 20  05-03-15: wbc 6.1; hgb 11.7; hct 37.1; mcv 90.3; plt 205; glucose 171; bun 31; creat 1.44; k+ 4.7; na++146; liver normal albumin 3.0      Review of Systems Constitutional: Negative for appetite change and fatigue.  HENT: Negative for congestion.   Respiratory: Positive for shortness of breath. Negative for cough and chest tightness.   Cardiovascular: Negative for chest pain, palpitations and leg swelling.  Gastrointestinal: Negative for nausea, abdominal pain, diarrhea and constipation.  Musculoskeletal: Negative for myalgias and arthralgias.  Skin: Negative for pallor.       Left leg wounds   Neurological: Negative for dizziness.  Psychiatric/Behavioral: The patient is not nervous/anxious.     Physical Exam Constitutional: No distress.  Morbidly obese   Neck: Neck supple. No JVD present.  Cardiovascular: Normal rate, regular rhythm and intact distal pulses.   Respiratory: breath sounds diminished has increase respiratory effort  GI: Soft. Bowel sounds are normal. She exhibits no distension. There is no tenderness.  Musculoskeletal: She exhibits no edema.  Is able to move all extremities  Is status post right bka   Neurological: She is alert.  Skin: Skin is warm and dry. She is not diaphoretic. Left lower extremity venous ulcers: being followed by wound clinic      ASSESSMENT/ PLAN:   1. Chronic diastolic heart failure: is on 1500 cc fluid restriction; will continue  her demadex  40 mg in the AM and 20 mg in the PM  with k+ 20 meq daily and will monitor her status.   2. Hypertension: will continue  asa 81 mg daily will increase cozaar to 25 mg daily   3. Diabetes: will continue lantus 20 units daily; will change the novolog to 5 units with meals with an additional 5 units for cbg >=150 her hgb a1c is 7.5;   4. Peripheral neuropathy: is  stable will continue neurontin 600 mg twice daily   5. Gerd: will continue prilosec 20 mg daily   6. Hypomagnesemia: will continue magox 400 mg three times daily   7. Depression: she is stable is receiving benefit from zoloft 50 mg daily   8. Left lower leg ulcers: venous in nature secondary to diabetes: will continue current treatment and will continue to be followed by wound clinic  9. Obstructive sleep apnea: she is not using  cpap on a regular basis at night. I continue to encourage her to use this on a regular basis in order to improve her overall health. She did verbalize understanding and stated that she would try  to use the machine on a more routine basis. With her morbid obesity is playing a significant role in her poor respiratory function. We discussed her obesity at length today.  10. Chronic respiratory failure with morbid obesity:  is without change is 02 dependent; will continue advair 250/50 twice daily     Ok Edwards NP [facility]  Contact [phone] Monday through Friday 8am- 5pm  After hours call [phone]

## 2015-07-17 NOTE — ED Provider Notes (Signed)
CSN: [id number]     Arrival date & time 07/17/15  20 History   First MD Initiated Contact with Patient 07/17/15 1714     No chief complaint on file.    (Consider location/radiation/quality/duration/timing/severity/associated sxs/prior Treatment) Patient is a 69 y.o. female presenting with altered mental status. The history is provided by the patient.  Altered Mental Status Presenting symptoms: lethargy   Severity:  Mild Most recent episode:  Today Episode history:  Single Timing:  Constant Progression:  Unchanged Chronicity:  New Context: nursing home resident   Context: not dementia   Associated symptoms: fever   Associated symptoms: no abdominal pain     Past Medical History  Diagnosis Date  . 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 Essential hypertension, benign 12/31/2012  . Chronic diastolic heart failure ([place]) 12/31/2012  . GERD 12/31/2012  . Unspecified vitamin D deficiency 12/31/2012  . Chronic pain 12/31/2012  . Venous stasis ulcers ([place])   . Venous insufficiency (chronic) (peripheral)    Past Surgical History  Procedure Laterality Date  . Leg amputation below knee Right 01/03/2012   No family history on file. Social History  Substance Use Topics  . Smoking status: Never Smoker   . Smokeless tobacco: Not on file  . Alcohol Use: No   OB History    No data available     Review of Systems  Constitutional: Positive for fever.  Gastrointestinal: Negative for abdominal pain.  All other systems reviewed and are negative.     Allergies  Ace inhibitors  Home Medications   Prior to Admission medications   Medication Sig Start Date End Date Taking? Authorizing Provider  acetaminophen (TYLENOL) 160 MG/5ML solution Place 20.3 mLs (650 mg total) into feeding tube every 6 (six) hours as needed for fever. 06/30/14   [name], NP  aspirin 81 MG tablet Take 81 mg by  mouth daily.    Historical Provider, MD  camphor-menthol [place] Ace) lotion Apply topically as needed for itching. 06/30/14   [name], NP  cetirizine-pseudoephedrine (ZYRTEC-D) 5-120 MG per tablet Take 1 tablet by mouth 2 (two) times daily.    Historical Provider, MD  cholecalciferol (VITAMIN D) 1000 UNITS tablet Take 2,000 Units by mouth daily.    Historical Provider, MD  DULoxetine (CYMBALTA) 20 MG capsule Take 40 mg by mouth daily.     Historical Provider, MD  Fluticasone Propionate (FLONASE NA) Place 1 spray into the nose daily as needed. For nasal congestion    Historical Provider, MD  Fluticasone-Salmeterol (ADVAIR) 250-50 MCG/DOSE AEPB Inhale 1 puff into the lungs 2 (two) times daily.    Historical Provider, MD  gabapentin (NEURONTIN) 600 MG tablet Take 600 mg by mouth 2 (two) times daily.    Historical Provider, MD  insulin aspart (NOVOLOG) 100 UNIT/ML injection Inject 5 Units into the skin 3 (three) times daily before meals. With an additional 5 units for cg >=150 Patient taking differently: Inject 5 Units into the skin 3 (three) times daily before meals. cbg >=150 07/17/15   Gerlene Fee, NP  Insulin Glargine (LANTUS SOLOSTAR) 100 UNIT/ML Solostar Pen Inject 20 Units into the skin at bedtime.     Historical Provider, MD  ipratropium-albuterol (DUONEB) 0.5-2.5 (3) MG/3ML SOLN Take 3 mLs by nebulization every 6 (six) hours as needed (shortness of breath). 06/30/14   [name], NP  losartan (COZAAR) 25 MG tablet Take 25 mg by mouth daily.  Historical Provider, MD  magnesium oxide (MAG-OX) 400 MG tablet Take 400 mg by mouth every 8 (eight) hours.     Historical Provider, MD  omeprazole (PRILOSEC) 20 MG capsule Take 20 mg by mouth daily.    Historical Provider, MD  potassium chloride SA (K-DUR,KLOR-CON) 20 MEQ tablet Take 20 mEq by mouth daily.    Historical Provider, MD  torsemide (DEMADEX) 20 MG tablet Take 1 tablet (20 mg total) by mouth 2 (two) times daily. Patient taking  differently: Take 20-40 mg by mouth 2 (two) times daily. 40 mg in the AM and 20 mg in the PM 11/22/14   Gerlene Fee, NP   BP 136/59 mmHg  Pulse 103  Temp(Src) 101.4 F (38.6 C) (Oral)  Resp 16  SpO2 94% Physical Exam  Constitutional: She is oriented to person, place, and time. She appears well-developed and well-nourished. No distress.  HENT:  Head: Normocephalic and atraumatic.  Mouth/Throat: Oropharynx is clear and moist.  Eyes: EOM are normal. Pupils are equal, round, and reactive to light.  Neck: Normal range of motion. Neck supple.  Cardiovascular: Normal rate and regular rhythm.  Exam reveals no friction rub.   No murmur heard. Pulmonary/Chest: Effort normal and breath sounds normal. No respiratory distress. She has no wheezes. She has no rales.  Abdominal: Soft. She exhibits no distension. There is no tenderness. There is no rebound.  Musculoskeletal: Normal range of motion. She exhibits no edema.  Neurological: She is alert and oriented to person, place, and time.  Skin: No rash noted. She is not diaphoretic.  Nursing note and vitals reviewed.   ED Course  Procedures (including critical care time) Labs Review Labs Reviewed  CBC - Abnormal; Notable for the following:    WBC 17.7 (*)    RDW 15.9 (*)    All other components within normal limits  COMPREHENSIVE METABOLIC PANEL - Abnormal; Notable for the following:    Chloride 98 (*)    CO2 34 (*)    Glucose, Bld 208 (*)    BUN 25 (*)    Creatinine, Ser 1.67 (*)    Albumin 2.8 (*)    GFR calc non Af Amer 30 (*)    GFR calc Af Amer 35 (*)    All other components within normal limits  URINALYSIS, ROUTINE W REFLEX MICROSCOPIC (NOT AT [facility]) - Abnormal; Notable for the following:    APPearance HAZY (*)    Hgb urine dipstick LARGE (*)    All other components within normal limits  URINE MICROSCOPIC-ADD ON - Abnormal; Notable for the following:    Casts HYALINE CASTS (*)    All other components within normal limits  CBC  - Abnormal; Notable for the following:    WBC 13.8 (*)    Hemoglobin 11.8 (*)    RDW 16.2 (*)    Platelets 120 (*)    All other components within normal limits  CREATININE, SERUM - Abnormal; Notable for the following:    Creatinine, Ser 1.72 (*)    GFR calc non Af Amer 29 (*)    GFR calc Af Amer 34 (*)    All other components within normal limits  GLUCOSE, CAPILLARY - Abnormal; Notable for the following:    Glucose-Capillary 182 (*)    All other components within normal limits  I-STAT ARTERIAL BLOOD GAS, ED - Abnormal; Notable for the following:    pCO2 arterial 63.5 (*)    pO2, Arterial 74.0 (*)    Bicarbonate 38.1 (*)  Acid-Base Excess 11.0 (*)    All other components within normal limits  URINE CULTURE  CULTURE, BLOOD (ROUTINE X 2)  CULTURE, BLOOD (ROUTINE X 2)  MRSA PCR SCREENING  BLOOD GAS, ARTERIAL  PROCALCITONIN  HEMOGLOBIN A1C  I-STAT TROPOININ, ED  I-STAT CG4 LACTIC ACID, ED    Imaging Review Dg Chest Portable 1 View  07/17/2015   CLINICAL DATA:  Acute onset of fever up to 102 degrees Fahrenheit earlier today, associated with hypotension. Current history of chronic diastolic heart failure, hypertension and diabetes.  EXAM: PORTABLE CHEST 1 VIEW  COMPARISON:  06/27/2014 and earlier.  FINDINGS: Suboptimal inspiration due to body habitus accounts for crowded bronchovascular markings diffusely, atelectasis in the lung bases and accentuates the cardiac silhouette. Taking this into account, cardiac silhouette moderately enlarged, unchanged. Pulmonary venous hypertension without overt pulmonary edema. No confluent airspace consolidation.  IMPRESSION: 1. Suboptimal inspiration accounts for bibasilar atelectasis. No acute cardiopulmonary disease otherwise. 2. Stable moderate cardiomegaly. Pulmonary venous hypertension without overt edema.   Electronically Signed   By: Evangeline Dakin M.D.   On: 07/17/2015 18:15   I have personally reviewed and evaluated these images and lab  results as part of my medical decision-making.   EKG Interpretation   Date/Time:  Monday July 17 2015 18:34:14 EDT Ventricular Rate:  102 PR Interval:  183 QRS Duration: 100 QT Interval:  337 QTC Calculation: 439 R Axis:   85 Text Interpretation:  Sinus tachycardia Borderline right axis deviation  Low voltage, precordial leads No prior for comparison Confirmed by Mingo Amber   MD, Tushar Enns ([id number]) on 07/18/2015 1:58:50 AM      MDM   Final diagnoses:  Fever, unspecified fever cause  Altered mental status, unspecified altered mental status type    69 year old female here with fever from [facility]. She has a history of diabetes, hypertension, morbid obesity. She is normally alert and oriented but today she is not acting like herself. Here she is sleepy and will have mumbling words. She does answer questions and follow commands. She has a new oxygen requirement. She had labs done at the facility showing a white count of 14 and a clear chest x-ray.  Here she has fever 101.4. She is not tachycardic and low 100s. His oral blood pressure. Will initiate sepsis workup and give broad spectrum antibiotics. Admitted. No source identified.  Evelina Bucy, MD 07/18/15 [phone]

## 2015-07-18 ENCOUNTER — Inpatient Hospital Stay (HOSPITAL_COMMUNITY): Payer: Medicare Other

## 2015-07-18 ENCOUNTER — Ambulatory Visit (HOSPITAL_COMMUNITY): Admission: RE | Admit: 2015-07-18 | Payer: Medicare Other | Source: Ambulatory Visit

## 2015-07-18 DIAGNOSIS — S81802A Unspecified open wound, left lower leg, initial encounter: Secondary | ICD-10-CM | POA: Diagnosis present

## 2015-07-18 DIAGNOSIS — A419 Sepsis, unspecified organism: Secondary | ICD-10-CM | POA: Diagnosis present

## 2015-07-18 DIAGNOSIS — R339 Retention of urine, unspecified: Secondary | ICD-10-CM | POA: Diagnosis present

## 2015-07-18 DIAGNOSIS — J9600 Acute respiratory failure, unspecified whether with hypoxia or hypercapnia: Secondary | ICD-10-CM | POA: Diagnosis present

## 2015-07-18 DIAGNOSIS — G9340 Encephalopathy, unspecified: Secondary | ICD-10-CM | POA: Diagnosis present

## 2015-07-18 DIAGNOSIS — E118 Type 2 diabetes mellitus with unspecified complications: Secondary | ICD-10-CM | POA: Diagnosis present

## 2015-07-18 LAB — URINALYSIS, ROUTINE W REFLEX MICROSCOPIC
BILIRUBIN URINE: NEGATIVE
GLUCOSE, UA: NEGATIVE mg/dL
KETONES UR: NEGATIVE mg/dL
Nitrite: NEGATIVE
PROTEIN: 30 mg/dL — AB
Specific Gravity, Urine: 1.014 (ref 1.005–1.030)
Urobilinogen, UA: 0.2 mg/dL (ref 0.0–1.0)
pH: 7.5 (ref 5.0–8.0)

## 2015-07-18 LAB — CBC
HCT: 39.1 % (ref 36.0–46.0)
HEMOGLOBIN: 11.8 g/dL — AB (ref 12.0–15.0)
MCH: 28.1 pg (ref 26.0–34.0)
MCHC: 30.2 g/dL (ref 30.0–36.0)
MCV: 93.1 fL (ref 78.0–100.0)
Platelets: 120 10*3/uL — ABNORMAL LOW (ref 150–400)
RBC: 4.2 MIL/uL (ref 3.87–5.11)
RDW: 16.2 % — ABNORMAL HIGH (ref 11.5–15.5)
WBC: 13.8 10*3/uL — AB (ref 4.0–10.5)

## 2015-07-18 LAB — URINE CULTURE
Culture: NO GROWTH
Special Requests: NORMAL

## 2015-07-18 LAB — MRSA PCR SCREENING: MRSA BY PCR: NEGATIVE

## 2015-07-18 LAB — CREATININE, SERUM
CREATININE: 1.72 mg/dL — AB (ref 0.44–1.00)
GFR calc non Af Amer: 29 mL/min — ABNORMAL LOW (ref >60)
GFR, EST AFRICAN AMERICAN: 34 mL/min — AB (ref >60)

## 2015-07-18 LAB — URINE MICROSCOPIC-ADD ON

## 2015-07-18 LAB — GLUCOSE, CAPILLARY
GLUCOSE-CAPILLARY: 182 mg/dL — AB (ref 65–99)
GLUCOSE-CAPILLARY: 134 mg/dL — AB (ref 65–99)
GLUCOSE-CAPILLARY: 142 mg/dL — AB (ref 65–99)
GLUCOSE-CAPILLARY: 196 mg/dL — AB (ref 65–99)
Glucose-Capillary: 153 mg/dL — ABNORMAL HIGH (ref 65–99)

## 2015-07-18 LAB — PROCALCITONIN: Procalcitonin: 2.02 ng/mL

## 2015-07-18 MED ORDER — DULOXETINE HCL 20 MG PO CPEP
40.0000 mg | ORAL_CAPSULE | Freq: Every day | ORAL | Status: DC
  Administered 2015-07-18 – 2015-07-21 (×4): 40 mg via ORAL
  Filled 2015-07-18 (×6): qty 2

## 2015-07-18 MED ORDER — GABAPENTIN 600 MG PO TABS
600.0000 mg | ORAL_TABLET | Freq: Two times a day (BID) | ORAL | Status: DC
  Administered 2015-07-18 – 2015-07-21 (×7): 600 mg via ORAL
  Filled 2015-07-18 (×7): qty 1

## 2015-07-18 MED ORDER — MOMETASONE FURO-FORMOTEROL FUM 100-5 MCG/ACT IN AERO
2.0000 | INHALATION_SPRAY | Freq: Two times a day (BID) | RESPIRATORY_TRACT | Status: DC
  Administered 2015-07-18 – 2015-07-21 (×7): 2 via RESPIRATORY_TRACT
  Filled 2015-07-18 (×2): qty 8.8

## 2015-07-18 MED ORDER — INSULIN ASPART 100 UNIT/ML ~~LOC~~ SOLN
0.0000 [IU] | Freq: Three times a day (TID) | SUBCUTANEOUS | Status: DC
  Administered 2015-07-18: 3 [IU] via SUBCUTANEOUS
  Administered 2015-07-18: 4 [IU] via SUBCUTANEOUS
  Administered 2015-07-18: 3 [IU] via SUBCUTANEOUS
  Administered 2015-07-19: 4 [IU] via SUBCUTANEOUS
  Administered 2015-07-19: 11 [IU] via SUBCUTANEOUS
  Administered 2015-07-19 – 2015-07-20 (×2): 4 [IU] via SUBCUTANEOUS
  Administered 2015-07-20: 7 [IU] via SUBCUTANEOUS
  Administered 2015-07-20 – 2015-07-21 (×2): 4 [IU] via SUBCUTANEOUS
  Administered 2015-07-21: 7 [IU] via SUBCUTANEOUS

## 2015-07-18 MED ORDER — HYDROCODONE-ACETAMINOPHEN 5-325 MG PO TABS
1.0000 | ORAL_TABLET | ORAL | Status: DC | PRN
  Administered 2015-07-19 – 2015-07-20 (×4): 1 via ORAL
  Filled 2015-07-18 (×4): qty 1

## 2015-07-18 MED ORDER — IPRATROPIUM-ALBUTEROL 0.5-2.5 (3) MG/3ML IN SOLN: 3.0000 mL | Freq: Four times a day (QID) | RESPIRATORY_TRACT | Status: DC | PRN

## 2015-07-18 MED ORDER — ONDANSETRON HCL 4 MG/2ML IJ SOLN
4.0000 mg | Freq: Four times a day (QID) | INTRAMUSCULAR | Status: DC | PRN
  Administered 2015-07-20: 4 mg via INTRAVENOUS
  Filled 2015-07-18: qty 2

## 2015-07-18 MED ORDER — LOSARTAN POTASSIUM 25 MG PO TABS
25.0000 mg | ORAL_TABLET | Freq: Every day | ORAL | Status: DC
  Administered 2015-07-18: 25 mg via ORAL
  Filled 2015-07-18 (×2): qty 1

## 2015-07-18 MED ORDER — SODIUM CHLORIDE 0.9 % IV BOLUS (SEPSIS): 500.0000 mL | INTRAVENOUS | Status: DC

## 2015-07-18 MED ORDER — SODIUM CHLORIDE 0.9 % IV BOLUS (SEPSIS)
1000.0000 mL | INTRAVENOUS | Status: DC
Start: 2015-07-18 — End: 2015-07-18

## 2015-07-18 MED ORDER — INSULIN ASPART 100 UNIT/ML ~~LOC~~ SOLN
0.0000 [IU] | Freq: Every day | SUBCUTANEOUS | Status: DC
  Administered 2015-07-20: 2 [IU] via SUBCUTANEOUS

## 2015-07-18 MED ORDER — SODIUM CHLORIDE 0.9 % IV SOLN: INTRAVENOUS | Status: DC

## 2015-07-18 MED ORDER — SODIUM CHLORIDE 0.9 % IJ SOLN
3.0000 mL | Freq: Two times a day (BID) | INTRAMUSCULAR | Status: DC
  Administered 2015-07-18 – 2015-07-21 (×6): 3 mL via INTRAVENOUS

## 2015-07-18 MED ORDER — VANCOMYCIN HCL 10 G IV SOLR
1250.0000 mg | INTRAVENOUS | Status: DC
  Administered 2015-07-18 – 2015-07-20 (×3): 1250 mg via INTRAVENOUS
  Filled 2015-07-18 (×4): qty 1250

## 2015-07-18 MED ORDER — ONDANSETRON HCL 4 MG PO TABS: 4.0000 mg | ORAL_TABLET | Freq: Four times a day (QID) | ORAL | Status: DC | PRN

## 2015-07-18 MED ORDER — HEPARIN SODIUM (PORCINE) 5000 UNIT/ML IJ SOLN
5000.0000 [IU] | Freq: Three times a day (TID) | INTRAMUSCULAR | Status: DC
  Administered 2015-07-18 – 2015-07-21 (×11): 5000 [IU] via SUBCUTANEOUS
  Filled 2015-07-18 (×10): qty 1

## 2015-07-18 MED ORDER — CHLORHEXIDINE GLUCONATE 0.12 % MT SOLN
15.0000 mL | Freq: Two times a day (BID) | OROMUCOSAL | Status: DC
  Administered 2015-07-18 – 2015-07-21 (×6): 15 mL via OROMUCOSAL
  Filled 2015-07-18 (×6): qty 15

## 2015-07-18 MED ORDER — HYDROCODONE-ACETAMINOPHEN 5-325 MG PO TABS
2.0000 | ORAL_TABLET | Freq: Once | ORAL | Status: AC
  Administered 2015-07-18: 2 via ORAL
  Filled 2015-07-18: qty 2

## 2015-07-18 MED ORDER — PIPERACILLIN-TAZOBACTAM 3.375 G IVPB
3.3750 g | Freq: Three times a day (TID) | INTRAVENOUS | Status: DC
  Administered 2015-07-18 – 2015-07-21 (×11): 3.375 g via INTRAVENOUS
  Filled 2015-07-18 (×15): qty 50

## 2015-07-18 MED ORDER — SODIUM CHLORIDE 0.9 % IV BOLUS (SEPSIS)
1000.0000 mL | Freq: Once | INTRAVENOUS | Status: AC
  Administered 2015-07-18: 1000 mL via INTRAVENOUS

## 2015-07-18 MED ORDER — CETYLPYRIDINIUM CHLORIDE 0.05 % MT LIQD
7.0000 mL | Freq: Two times a day (BID) | OROMUCOSAL | Status: DC
  Administered 2015-07-18 – 2015-07-21 (×7): 7 mL via OROMUCOSAL

## 2015-07-18 NOTE — Progress Notes (Signed)
ANTIBIOTIC CONSULT NOTE - INITIAL  Pharmacy Consult for Vancomycin and Zosyn  Indication: rule out sepsis  Allergies  Allergen Reactions  . Ace Inhibitors     unknown    Patient Measurements:   Adjusted Body Weight: 85 kg  Vital Signs: Temp: 98 F (36.7 C) (10/04 0007) Temp Source: Oral (10/04 0007) BP: 100/54 mmHg (10/04 0007) Pulse Rate: 97 (10/04 0007) Intake/Output from previous day: 10/03 0701 - 10/04 0700 In: 1050 [I.V.:1050] Out: 1900 [Urine:1900] Intake/Output from this shift: Total I/O In: 1000 [I.V.:1000] Out: 1900 [Urine:1900]  Labs:  Recent Labs  07/17/15 1755  WBC 17.7*  HGB 12.0  PLT 185  CREATININE 1.67*   CrCl cannot be calculated (Unknown ideal weight.). No results for input(s): VANCOTROUGH, VANCOPEAK, VANCORANDOM, GENTTROUGH, GENTPEAK, GENTRANDOM, TOBRATROUGH, TOBRAPEAK, TOBRARND, AMIKACINPEAK, AMIKACINTROU, AMIKACIN in the last 72 hours.   Microbiology: No results found for this or any previous visit (from the past 720 hour(s)).  Medical History: Past Medical History  Diagnosis Date  . 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 Essential hypertension, benign 12/31/2012  . Chronic diastolic heart failure ([place]) 12/31/2012  . GERD 12/31/2012  . Unspecified vitamin D deficiency 12/31/2012  . Chronic pain 12/31/2012  . Venous stasis ulcers ([place])   . Venous insufficiency (chronic) (peripheral)     Medications:  Prescriptions prior to admission  Medication Sig Dispense Refill Last Dose  . acetaminophen (TYLENOL) 160 MG/5ML solution Place 20.3 mLs (650 mg total) into feeding tube every 6 (six) hours as needed for fever. 120 mL 0 07/17/2015 at Unknown time  . aspirin 81 MG tablet Take 81 mg by mouth daily.   07/17/2015 at Unknown time  . camphor-menthol (SARNA) lotion Apply topically as needed for itching. 222 mL 0 PRN  . cetirizine-pseudoephedrine (ZYRTEC-D)  5-120 MG per tablet Take 1 tablet by mouth 2 (two) times daily.   07/16/2015 at Unknown time  . cholecalciferol (VITAMIN D) 1000 UNITS tablet Take 2,000 Units by mouth daily.   07/17/2015 at Unknown time  . DULoxetine (CYMBALTA) 20 MG capsule Take 40 mg by mouth daily.    07/17/2015 at Unknown time  . Fluticasone Propionate (FLONASE NA) Place 1 spray into the nose daily as needed. For nasal congestion   PRN  . Fluticasone-Salmeterol (ADVAIR) 250-50 MCG/DOSE AEPB Inhale 1 puff into the lungs 2 (two) times daily.   07/17/2015 at Unknown time  . gabapentin (NEURONTIN) 600 MG tablet Take 600 mg by mouth 2 (two) times daily.   07/17/2015 at Unknown time  . insulin aspart (NOVOLOG) 100 UNIT/ML injection Inject 5 Units into the skin 3 (three) times daily before meals. With an additional 5 units for cg >=150 (Patient taking differently: Inject 5 Units into the skin 3 (three) times daily before meals. cbg >=150) 10 mL prn 07/17/2015 at Unknown time  . Insulin Glargine (LANTUS SOLOSTAR) 100 UNIT/ML Solostar Pen Inject 20 Units into the skin at bedtime.    07/16/2015 at    . ipratropium-albuterol (DUONEB) 0.5-2.5 (3) MG/3ML SOLN Take 3 mLs by nebulization every 6 (six) hours as needed (shortness of breath). 360 mL  PRN  . losartan (COZAAR) 25 MG tablet Take 25 mg by mouth daily.    07/17/2015 at Unknown time  . magnesium oxide (MAG-OX) 400 MG tablet Take 400 mg by mouth every 8 (eight) hours.    07/17/2015 at Unknown time  . omeprazole (PRILOSEC) 20 MG capsule Take 20 mg  by mouth daily.   07/17/2015 at Unknown time  . potassium chloride SA (K-DUR,KLOR-CON) 20 MEQ tablet Take 20 mEq by mouth daily.   07/17/2015 at Unknown time  . torsemide (DEMADEX) 20 MG tablet Take 1 tablet (20 mg total) by mouth 2 (two) times daily. (Patient taking differently: Take 20-40 mg by mouth 2 (two) times daily. 40 mg in the AM and 20 mg in the PM) 90 tablet 11 07/17/2015 at Unknown time   Assessment: 69 y.o. female with fever/AMS, possible  sepsis for empiric antibiotics  Vancomycin 2500  mg IV given in ED at 1900  Goal of Therapy:  Vancomycin trough level 15-20 mcg/ml  Plan:  Vancomycin 1250 mg IV q24h Zosyn 3.375 g IV q8h   Keyly Baldonado, Bronson Curb 07/18/2015,12:35 AM

## 2015-07-18 NOTE — Progress Notes (Signed)
[facility] PROGRESS NOTE  Heather Zimmerman. [name] [id number] DOB: April 22, 1946 DOA: 07/17/2015 PCP: Gildardo Cranker, DO  Assessment/Plan:  Principal Problem:   Acute encephalopathy: secondary to toxic encephalopathy. Sleepy. Unclear what is baseline. Continue abx, bipap qhs Active Problems:   Fever/sepsis: unknown etiology. Repeat CXR. Await blood cultures. Continue zosyn and vanc. WBC improving. procalcitonin 2. Lactate normal   Essential hypertension, benign: antihypertensives held due to hypotension   Morbid obesity (HCC)   Hx of right BKA (HCC)   Venous stasis dermatitis: placing unnaboot now. No evidence of infection.  Per NP Ok Edwards, improved from previous   Diabetes mellitus with complication [facility]): controlled   Acute on chronic respiratory failure: continue oxygen. bipap qhs   Urinary retention: continue foley. Eventual voiding trial OSA   Code Status:  full Family Communication:  None available Disposition Plan:  Eventually back to SNF  Consultants:    Procedures:     Antibiotics:  vanc zosyn  HPI/Subjective: No complaints. Difficult to understand  Objective: Filed Vitals:   07/18/15 1259  BP:   Pulse:   Temp: 99.3 F (37.4 C)  Resp:     Intake/Output Summary (Last 24 hours) at 07/18/15 1306 Last data filed at 07/18/15 0500  Gross per 24 hour  Intake   1050 ml  Output   2350 ml  Net  -1300 ml   Filed Weights   07/18/15 0407  Weight: 149.143 kg (328 lb 12.8 oz)    Exam:   General:  Alseep. Arousable. Falls quickly back asleep. Difficult to understand. Extreme morbid obesity  Cardiovascular: RRR without MGR  Respiratory: diminished throughout without MGR  Abdomen: obese, s, nt, nd  Ext: right stump ok. Left leg with ulcers. No odor. No purulence. No cellulitis  Basic Metabolic Panel:  Recent Labs Lab 07/17/15 1755 07/18/15 0049  NA 141  --   K 4.4  --   CL 98*  --   CO2 34*  --   GLUCOSE 208*  --   BUN 25*  --    CREATININE 1.67* 1.72*  CALCIUM 8.9  --    Liver Function Tests:  Recent Labs Lab 07/17/15 1755  AST 24  ALT 20  ALKPHOS 78  BILITOT 0.9  PROT 7.5  ALBUMIN 2.8*   No results for input(s): LIPASE, AMYLASE in the last 168 hours. No results for input(s): AMMONIA in the last 168 hours. CBC:  Recent Labs Lab 07/17/15 1755 07/18/15 0049  WBC 17.7* 13.8*  HGB 12.0 11.8*  HCT 39.6 39.1  MCV 93.4 93.1  PLT 185 120*   Cardiac Enzymes: No results for input(s): CKTOTAL, CKMB, CKMBINDEX, TROPONINI in the last 168 hours. BNP (last 3 results) No results for input(s): BNP in the last 8760 hours.  ProBNP (last 3 results) No results for input(s): PROBNP in the last 8760 hours.  CBG:  Recent Labs Lab 07/18/15 0121 07/18/15 0728 07/18/15 1135  GLUCAP 182* 153* 134*    Recent Results (from the past 240 hour(s))  Blood culture (routine x 2)     Status: None (Preliminary result)   Collection Time: 07/17/15  5:55 PM  Result Value Ref Range Status   Specimen Description BLOOD LEFT ANTECUBITAL  Final   Special Requests BOTTLES DRAWN AEROBIC AND ANAEROBIC 5CC  Final   Culture NO GROWTH < 24 HOURS  Final   Report Status PENDING  Incomplete  Blood culture (routine x 2)     Status: None (Preliminary result)   Collection Time: 07/17/15  5:59 PM  Result Value Ref Range Status   Specimen Description BLOOD BLOOD RIGHT FOREARM  Final   Special Requests BOTTLES DRAWN AEROBIC ONLY 4CC  Final   Culture NO GROWTH < 24 HOURS  Final   Report Status PENDING  Incomplete  Urine culture     Status: None (Preliminary result)   Collection Time: 07/17/15  7:40 PM  Result Value Ref Range Status   Specimen Description URINE, CATHETERIZED  Final   Special Requests Normal  Final   Culture NO GROWTH < 12 HOURS  Final   Report Status PENDING  Incomplete  MRSA PCR Screening     Status: None   Collection Time: 07/18/15 12:21 AM  Result Value Ref Range Status   MRSA by PCR NEGATIVE NEGATIVE Final     Comment:        The GeneXpert MRSA Assay (FDA approved for NASAL specimens only), is one component of a comprehensive MRSA colonization surveillance program. It is not intended to diagnose MRSA infection nor to guide or monitor treatment for MRSA infections.      Studies: Dg Chest Portable 1 View  07/17/2015   CLINICAL DATA:  Acute onset of fever up to 102 degrees Fahrenheit earlier today, associated with hypotension. Current history of chronic diastolic heart failure, hypertension and diabetes.  EXAM: PORTABLE CHEST 1 VIEW  COMPARISON:  06/27/2014 and earlier.  FINDINGS: Suboptimal inspiration due to body habitus accounts for crowded bronchovascular markings diffusely, atelectasis in the lung bases and accentuates the cardiac silhouette. Taking this into account, cardiac silhouette moderately enlarged, unchanged. Pulmonary venous hypertension without overt pulmonary edema. No confluent airspace consolidation.  IMPRESSION: 1. Suboptimal inspiration accounts for bibasilar atelectasis. No acute cardiopulmonary disease otherwise. 2. Stable moderate cardiomegaly. Pulmonary venous hypertension without overt edema.   Electronically Signed   By: Evangeline Dakin M.D.   On: 07/17/2015 18:15    Scheduled Meds: . antiseptic oral rinse  7 mL Mouth Rinse q12n4p  . chlorhexidine  15 mL Mouth Rinse BID  . DULoxetine  40 mg Oral Daily  . gabapentin  600 mg Oral BID  . heparin  5,000 Units Subcutaneous 3 times per day  . insulin aspart  0-20 Units Subcutaneous TID WC  . insulin aspart  0-5 Units Subcutaneous QHS  . losartan  25 mg Oral Daily  . mometasone-formoterol  2 puff Inhalation BID  . piperacillin-tazobactam (ZOSYN)  IV  3.375 g Intravenous Q8H  . sodium chloride  3 mL Intravenous Q12H  . vancomycin  1,250 mg Intravenous Q24H   Continuous Infusions: . [START ON 07/19/2015] sodium chloride      Time spent: 35 minutes  [place] [facility] [email or web address], password  [facility] 07/18/2015, 1:06 PM  LOS: 1 day

## 2015-07-18 NOTE — Progress Notes (Signed)
UR Completed Kadden Osterhout Graves-Bigelow, RN,BSN [phone]  

## 2015-07-18 NOTE — Progress Notes (Signed)
Orthopedic Tech Progress Note Patient Details:  Heather Zimmerman. Azure [birth year] [id number]  Ortho Devices Type of Ortho Device: Louretta Parma boot Ortho Device/Splint Interventions: Application   [place] Pink 07/18/2015, 1:53 PM

## 2015-07-18 NOTE — H&P (Addendum)
[facility] History and Physical  [name]. [name] [id number] DOB: [birth year] DOA: 07/17/2015  Referring physician: Dr Mingo Amber - [facility] PCP: Gildardo Cranker, DO   Chief Complaint: AMS  HPI: Heather Zimmerman. Heather Zimmerman is a 69 y.o. female  Level V caveat: Patient presenting in altered mental state from nursing home. Patient will wake up briefly in order to provide very basic answers to questions which are sometimes appropriate and sometimes not. No family available for consultation. History provided in limited amount by patient and by ED physician.  Patient was noted today by nursing home facility staff to be lethargic and difficult to arouse. The patient often times is noncompliant with her home oxygen while sleeping and is difficult to rales at baseline. Today symptoms are constant and worse than normal. There are no other reported recent complaints of the nursing home staff other than fever. Nothing was given to alleviate patient's symptoms.    Review of Systems:  Unable to obtain further review of systems due to patient's altered mental state.  Past Medical History  Diagnosis Date  . 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 Essential hypertension, benign 12/31/2012  . Chronic diastolic heart failure ([place]) 12/31/2012  . GERD 12/31/2012  . Unspecified vitamin D deficiency 12/31/2012  . Chronic pain 12/31/2012  . Venous stasis ulcers ([place])   . Venous insufficiency (chronic) (peripheral)    Past Surgical History  Procedure Laterality Date  . Leg amputation below knee Right 01/03/2012   Social History:  reports that she has never smoked. She does not have any smokeless tobacco history on file. She reports that she does not drink alcohol. Her drug history is not on file.  Allergies  Allergen Reactions  . Ace Inhibitors     unknown    No family history on file.   Prior to Admission medications     Medication Sig Start Date End Date Taking? Authorizing Provider  acetaminophen (TYLENOL) 160 MG/5ML solution Place 20.3 mLs (650 mg total) into feeding tube every 6 (six) hours as needed for fever. 06/30/14  Yes [name], NP  aspirin 81 MG tablet Take 81 mg by mouth daily.   Yes Historical Provider, MD  camphor-menthol Timoteo Ace) lotion Apply topically as needed for itching. 06/30/14  Yes [name], NP  cetirizine-pseudoephedrine (ZYRTEC-D) 5-120 MG per tablet Take 1 tablet by mouth 2 (two) times daily.   Yes Historical Provider, MD  cholecalciferol (VITAMIN D) 1000 UNITS tablet Take 2,000 Units by mouth daily.   Yes Historical Provider, MD  DULoxetine (CYMBALTA) 20 MG capsule Take 40 mg by mouth daily.    Yes Historical Provider, MD  Fluticasone Propionate (FLONASE NA) Place 1 spray into the nose daily as needed. For nasal congestion   Yes Historical Provider, MD  Fluticasone-Salmeterol (ADVAIR) 250-50 MCG/DOSE AEPB Inhale 1 puff into the lungs 2 (two) times daily.   Yes Historical Provider, MD  gabapentin (NEURONTIN) 600 MG tablet Take 600 mg by mouth 2 (two) times daily.   Yes Historical Provider, MD  insulin aspart (NOVOLOG) 100 UNIT/ML injection Inject 5 Units into the skin 3 (three) times daily before meals. With an additional 5 units for cg >=150 Patient taking differently: Inject 5 Units into the skin 3 (three) times daily before meals. cbg >=150 07/17/15  Yes Gerlene Fee, NP  Insulin Glargine (LANTUS SOLOSTAR) 100 UNIT/ML Solostar Pen Inject 20 Units into the skin at bedtime.  Yes Historical Provider, MD  ipratropium-albuterol (DUONEB) 0.5-2.5 (3) MG/3ML SOLN Take 3 mLs by nebulization every 6 (six) hours as needed (shortness of breath). 06/30/14  Yes [name], NP  losartan (COZAAR) 25 MG tablet Take 25 mg by mouth daily.    Yes Historical Provider, MD  magnesium oxide (MAG-OX) 400 MG tablet Take 400 mg by mouth every 8 (eight) hours.    Yes Historical Provider, MD   omeprazole (PRILOSEC) 20 MG capsule Take 20 mg by mouth daily.   Yes Historical Provider, MD  potassium chloride SA (K-DUR,KLOR-CON) 20 MEQ tablet Take 20 mEq by mouth daily.   Yes Historical Provider, MD  torsemide (DEMADEX) 20 MG tablet Take 1 tablet (20 mg total) by mouth 2 (two) times daily. Patient taking differently: Take 20-40 mg by mouth 2 (two) times daily. 40 mg in the AM and 20 mg in the PM 11/22/14  Yes Gerlene Fee, NP   Physical Exam: Filed Vitals:   07/17/15 2230 07/17/15 2245 07/17/15 2300 07/18/15 0007  BP: 111/54 104/53 116/47 100/54  Pulse: 96 97 95 97  Temp:    98 F (36.7 C)  TempSrc:    Oral  Resp: 25 25 25 22   SpO2: 93% 96% 96% 88%    Wt Readings from Last 3 Encounters:  06/23/15 149.687 kg (330 lb)  05/04/15 151.501 kg (334 lb)  04/27/15 150.594 kg (332 lb)    General: an apparent mild distress, difficult to arouse. Eyes:  PERRL, normal lids, irises  ENT:  Dry tacky mucous membranes Neck:  no LAD, masses or thyromegaly Cardiovascular: very difficult to appreciate cardiac sounds due to patient's habitus, 26 systolic murmur, regular rate and rhythm, Respiratory: again very difficult to appreciate respiratory sounds due to patient's body habitus but no appreciable wheezes or rhonchi were heard. Increased effort. Abdomen:  soft, ntnd Skin: significant skin breakdown of the left lower extremity with ulcerations along the lateral aspect distress upon the bed. Musculoskeletal: right BKA, otherwise able to move other extremities. Psychiatric: patient able to fully answer very simple questions and even then only does so approximately critically 50% of the time. Neurologic: cranial nerves II through XII grossly intact, moves all extremities and coordinated fashion, follows basic commands.          Labs on Admission:  Basic Metabolic Panel:  Recent Labs Lab 07/17/15 1755  NA 141  K 4.4  CL 98*  CO2 34*  GLUCOSE 208*  BUN 25*  CREATININE 1.67*  CALCIUM  8.9   Liver Function Tests:  Recent Labs Lab 07/17/15 1755  AST 24  ALT 20  ALKPHOS 78  BILITOT 0.9  PROT 7.5  ALBUMIN 2.8*   No results for input(s): LIPASE, AMYLASE in the last 168 hours. No results for input(s): AMMONIA in the last 168 hours. CBC:  Recent Labs Lab 07/17/15 1755  WBC 17.7*  HGB 12.0  HCT 39.6  MCV 93.4  PLT 185   Cardiac Enzymes: No results for input(s): CKTOTAL, CKMB, CKMBINDEX, TROPONINI in the last 168 hours.  BNP (last 3 results) No results for input(s): BNP in the last 8760 hours.  ProBNP (last 3 results) No results for input(s): PROBNP in the last 8760 hours.  CBG: No results for input(s): GLUCAP in the last 168 hours.  Radiological Exams on Admission: Dg Chest Portable 1 View  07/17/2015   CLINICAL DATA:  Acute onset of fever up to 102 degrees Fahrenheit earlier today, associated with hypotension. Current history of chronic  diastolic heart failure, hypertension and diabetes.  EXAM: PORTABLE CHEST 1 VIEW  COMPARISON:  06/27/2014 and earlier.  FINDINGS: Suboptimal inspiration due to body habitus accounts for crowded bronchovascular markings diffusely, atelectasis in the lung bases and accentuates the cardiac silhouette. Taking this into account, cardiac silhouette moderately enlarged, unchanged. Pulmonary venous hypertension without overt pulmonary edema. No confluent airspace consolidation.  IMPRESSION: 1. Suboptimal inspiration accounts for bibasilar atelectasis. No acute cardiopulmonary disease otherwise. 2. Stable moderate cardiomegaly. Pulmonary venous hypertension without overt edema.   Electronically Signed   By: Evangeline Dakin M.D.   On: 07/17/2015 18:15      Assessment/Plan Active Problems:   Essential hypertension, benign   Fever   Acute encephalopathy   Diabetes mellitus with complication (HCC)   Open wound of left lower extremity   Acute respiratory failure (HCC)   Sepsis (HCC)   Urinary retention   Acute Encephalopathy:  likely multifactorial including sepsis and acute respiratory decompensation. Lactic acid 1.0, WBC 17.7 (no differential obtained), Febrile, UA fairly unremarkable, chest x-ray without evidence of acute process. ABG appears to be near patient's baseline including pH is 7.38, pCO2 63.5, PO2 74, bicarbonate 38. - step down - Vanc/Zosyn - f/u BCX UCX - IV fluid bolus  Acute respiratory Decompensation: pt w/ obesity hypoventilation at baseline. Per report pt is typically very non-compliant w/ home O2 at night. New O2 requirement. ABG as above. Suspect underlying HCAP that does not appear on plain film - RT consult - Cipap/Bipap - consider repeat CXR in am after hydration overnight - ABX as above - continue dulera, duoneb  Chronic LE wounds: no active cellulitis - unnaboot  Urinary retention: 900cc out after i/o catheter. No urinary complaints. UA w/ blood but no sign of infection. Suspect due to sepsis/AMS. - Foley catheter  DM:  - hold home lantus - SSI - A1c  HTN: - continue losartan - if unable to take PO then hydralazine PRN  GERD: - change ppi to IV  Depression: - Ciontinue Cymbalta  Peripheral neuropathy: - continue Neurontin  Code Status: FULL  DVT Prophylaxis: Hep Family Communication: None Disposition Plan: Pending improvement     Daxon Kyne Lenna Sciara, MD Family Medicine [facility] [email or web address] Password [email or web address]

## 2015-07-18 NOTE — Care Management Note (Addendum)
Case Management Note  Patient Details  Name: Heather Zimmerman MRN: [id number] Date of Birth: December 10, 1945  Subjective/Objective:   Pt admitted for fever and AMS. Initiated on IV antibiotics.  Pt is from [facility].               Action/Plan: CSW to assist with disposition needs.  CM will continue to monitor.    Expected Discharge Date:                  Expected Discharge Plan:  Skilled Nursing Facility  In-House Referral:  Clinical Social Work  Discharge planning Services  CM Consult  Post Acute Care Choice:  NA Choice offered to:  NA  DME Arranged:  N/A DME Agency:  NA  HH Arranged:  NA HH Agency:  NA  Status of Service:  In process, will continue to follow  Medicare Important Message Given:    Date Medicare IM Given:    Medicare IM give by:    Date Additional Medicare IM Given:    Additional Medicare Important Message give by:     If discussed at [place] of Stay Meetings, dates discussed:    Additional Comments: [id number] 07-21-15 Jacqlyn Krauss, RN,BSN [phone] Plan for d/c today to [facility].  Bethena Roys, RN 07/18/2015, 12:29 PM

## 2015-07-19 ENCOUNTER — Encounter (HOSPITAL_COMMUNITY): Payer: Self-pay | Admitting: General Practice

## 2015-07-19 DIAGNOSIS — E118 Type 2 diabetes mellitus with unspecified complications: Secondary | ICD-10-CM

## 2015-07-19 DIAGNOSIS — G9340 Encephalopathy, unspecified: Secondary | ICD-10-CM

## 2015-07-19 DIAGNOSIS — I10 Essential (primary) hypertension: Secondary | ICD-10-CM

## 2015-07-19 DIAGNOSIS — S81802D Unspecified open wound, left lower leg, subsequent encounter: Secondary | ICD-10-CM

## 2015-07-19 DIAGNOSIS — Z89511 Acquired absence of right leg below knee: Secondary | ICD-10-CM

## 2015-07-19 DIAGNOSIS — R509 Fever, unspecified: Secondary | ICD-10-CM

## 2015-07-19 DIAGNOSIS — I83029 Varicose veins of left lower extremity with ulcer of unspecified site: Secondary | ICD-10-CM

## 2015-07-19 DIAGNOSIS — A419 Sepsis, unspecified organism: Principal | ICD-10-CM

## 2015-07-19 DIAGNOSIS — L03116 Cellulitis of left lower limb: Secondary | ICD-10-CM

## 2015-07-19 DIAGNOSIS — R339 Retention of urine, unspecified: Secondary | ICD-10-CM

## 2015-07-19 LAB — HEMOGLOBIN A1C
Hgb A1c MFr Bld: 7.8 % — ABNORMAL HIGH (ref 4.8–5.6)
Mean Plasma Glucose: 177 mg/dL

## 2015-07-19 LAB — CBC
HCT: 38.5 % (ref 36.0–46.0)
Hemoglobin: 11.4 g/dL — ABNORMAL LOW (ref 12.0–15.0)
MCH: 28.1 pg (ref 26.0–34.0)
MCHC: 29.6 g/dL — ABNORMAL LOW (ref 30.0–36.0)
MCV: 94.8 fL (ref 78.0–100.0)
PLATELETS: 152 10*3/uL (ref 150–400)
RBC: 4.06 MIL/uL (ref 3.87–5.11)
RDW: 15.9 % — ABNORMAL HIGH (ref 11.5–15.5)
WBC: 8.3 10*3/uL (ref 4.0–10.5)

## 2015-07-19 LAB — BASIC METABOLIC PANEL
ANION GAP: 10 (ref 5–15)
BUN: 19 mg/dL (ref 6–20)
CALCIUM: 8.4 mg/dL — AB (ref 8.9–10.3)
CO2: 32 mmol/L (ref 22–32)
Chloride: 97 mmol/L — ABNORMAL LOW (ref 101–111)
Creatinine, Ser: 1.71 mg/dL — ABNORMAL HIGH (ref 0.44–1.00)
GFR, EST AFRICAN AMERICAN: 34 mL/min — AB (ref >60)
GFR, EST NON AFRICAN AMERICAN: 29 mL/min — AB (ref >60)
Glucose, Bld: 161 mg/dL — ABNORMAL HIGH (ref 65–99)
Potassium: 3.9 mmol/L (ref 3.5–5.1)
SODIUM: 139 mmol/L (ref 135–145)

## 2015-07-19 LAB — GLUCOSE, CAPILLARY
GLUCOSE-CAPILLARY: 281 mg/dL — AB (ref 65–99)
GLUCOSE-CAPILLARY: 193 mg/dL — AB (ref 65–99)
Glucose-Capillary: 162 mg/dL — ABNORMAL HIGH (ref 65–99)
Glucose-Capillary: 258 mg/dL — ABNORMAL HIGH (ref 65–99)

## 2015-07-19 MED ORDER — INSULIN GLARGINE 100 UNIT/ML ~~LOC~~ SOLN
5.0000 [IU] | Freq: Every day | SUBCUTANEOUS | Status: DC
  Administered 2015-07-19 – 2015-07-20 (×2): 5 [IU] via SUBCUTANEOUS
  Filled 2015-07-19 (×3): qty 0.05

## 2015-07-19 NOTE — Clinical Social Work Note (Signed)
Clinical Social Work Assessment  Patient Details  Name: Heather Zimmerman MRN: [id number] Date of Birth: 1946/09/17  Date of referral:  07/19/15               Reason for consult:  Discharge Planning, Facility Placement                Permission sought to share information with:  Facility Art therapist granted to share information::  Yes, Verbal Permission Granted  Name::        Agency::  SNFs  Relationship::     Contact Information:     Housing/Transportation Living arrangements for the past 2 months:  [place] of Information:  Patient Patient Interpreter Needed:  None Criminal Activity/Legal Involvement Pertinent to Current Situation/Hospitalization:  No - Comment as needed Significant Relationships:  Siblings Lives with:  Facility Resident Do you feel safe going back to the place where you live?  Yes Need for family participation in patient care:  Yes (Comment)  Care giving concerns:  Patient does report that she feels that she would be given better care at another long term care facility. She states she doesn't think the nurse techs at [facility] take care of her.   Social Worker assessment / plan:  CSW met with patient at bedside to complete assessment. Patient states that she is a long term resident at [facility] but would be interested in looking at other facility options if available. CSW explained SNF search/placement process and answered the patient's questions. CSW explained that if transfer could not be made this admission the patient would need to discharge back to [facility] where she could request transfer. She verbalized understanding of this. Patient shares that she does have existing family (siblings in [place], [place], and [place]) but their involvement with her care is limited. CSW will follow.  Employment status:  Disabled (Comment on whether or not  currently receiving Disability) Insurance information:  Medicare, Medicaid In [place] PT Recommendations:  Not assessed at this time Information / Referral to community resources:  [place]  Patient/Family's Response to care:  Patient appears to be happy with the care the patient has received while at [facility].  Patient/Family's Understanding of and Emotional Response to Diagnosis, Current Treatment, and Prognosis:  The patient has fair insight into reason for admission and she understands that she will need to return to a long term care setting at discharge so that her medical needs are met.   Emotional Assessment Appearance:  Appears stated age Attitude/Demeanor/Rapport:  Other (Patient is appropriate) Affect (typically observed):  Accepting, Appropriate, Calm Orientation:  Oriented to Self, Oriented to Place, Oriented to  Time, Oriented to Situation Alcohol / Substance use:  Never Used Psych involvement (Current and /or in the community):  No (Comment)  Discharge Needs  Concerns to be addressed:  Discharge Planning Concerns Readmission within the last 30 days:  Yes Current discharge risk:  Chronically ill, Physical Impairment, Dependent with Mobility Barriers to Discharge:  Continued Medical Work up   [employer] MSW, [place], [place], [phone]

## 2015-07-19 NOTE — Clinical Social Work Placement (Signed)
   CLINICAL SOCIAL WORK PLACEMENT  NOTE  Date:  07/19/2015  Patient Details  Name: Heather Zimmerman MRN: [id number] Date of Birth: 01-05-1946  Clinical Social Work is seeking post-discharge placement for this patient at the [place] level of care (*CSW will initial, date and re-position this form in  chart as items are completed):  Yes   Patient/family provided with [facility] Work Department's list of facilities offering this level of care within the geographic area requested by the patient (or if unable, by the patient's family).  Yes   Patient/family informed of their freedom to choose among providers that offer the needed level of care, that participate in Medicare, Medicaid or managed care program needed by the patient, have an available bed and are willing to accept the patient.  Yes   Patient/family informed of [facility]'s ownership interest in [facility] and [facility], as well as of the fact that they are under no obligation to receive care at these facilities.  PASRR submitted to EDS on       PASRR number received on       Existing PASRR number confirmed on 07/19/15     FL2 transmitted to all facilities in geographic area requested by pt/family on 07/19/15     FL2 transmitted to all facilities within larger geographic area on       Patient informed that his/her managed care company has contracts with or will negotiate with certain facilities, including the following:            Patient/family informed of bed offers received.  Patient chooses bed at       Physician recommends and patient chooses bed at      Patient to be transferred to   on  .  Patient to be transferred to facility by       Patient family notified on   of transfer.  Name of family member notified:        PHYSICIAN       Additional Comment:    _______________________________________________ Liz Beach MSW, [place], [place], [phone]

## 2015-07-19 NOTE — Progress Notes (Signed)
PROGRESS NOTE  Heather Zimmerman. [name] QVZ:[id number] DOB: [birth year] DOA: 07/17/2015 PCP: Gildardo Cranker, DO   HPI:  69 y/o female presented to the ED with altered mental status from nursing home. Per admitting provider, patient was noted by nursing home staff to be lethargic and difficult to arouse. They report frequent non-compliance with home O2 while sleeping and is difficult to rouse at baseline. Symptoms were acutely worse on day of admission. Staff reportedly also noticed fever.   In the ED, she was found to be hypotensive at 72/41, febrile at 102.2, and with a WBC count of 17.7. She was admitted for sepsis and acute encephalopathy to the hospitalist service.  Subjective / 24 H Interval events:  Patient awake and alert, comfortable other than pain in left thigh above the knee. Denies CP, cough, SOB, N/V/D, chills, and abdominal pain.   Assessment/Plan: Principal Problem:   Acute encephalopathy Active Problems:   Essential hypertension, benign   Morbid obesity (HCC)   Hx of right BKA (HCC)   Venous ulcer of left leg (HCC)   Fever   Diabetes mellitus with complication ([place])   Open wound of left lower extremity   Acute respiratory failure (HCC)   Sepsis ([place])   Urinary retention   Cellulitis of leg, left  Fever/sepsis due to LLE cellulitis - Presented with fever of 102.2, WBC of 17.7, and hypotensive at 72/41. Possible source identified as cellulitis of left leg above knee evidenced by pain, warmth, and redness to area.  - U/A and chest x-ray negative for infection, and patient denies diarrhea - Patient is now afebrile with no leukocytosis after two days of IV vancomycin and zosyn, continue abx. Will transition to PO abx if 1-2 days if continuing to improve  Acute encephalopathy  - Likely toxic due to above, resolved - Patient alert and oriented, answering questions appropriately - Continue to monitor  HTN - Home medications held due to ongoing hypotension, will resume once BP  has normalized  Diabetes Mellitus - HgA1c of 7.8. On 20 units nightly Lantus and SSI at home - add Lantus at lower dose at 5. Fasting CBGs not that high this morning. Suspect diet is different here  Venous Stasis Dermatitis - With LLE ulceration, Unna boot in place - Continue to monitor  Morbid Obesity - Nutrition consult - Discussed importance of weight loss for her overall health and well-being, patient aware of need to lose weight and reports trying to eat more vegetables lately  Urinary Retention - Continue foley, voiding trial prior to discharge - U/A negative for infection  Obstructive Sleep Apnea  - Non-compliant with CPAP, does voice desire to resume using nightly - Encourage CPAP use for sleep   Diet: Diet heart healthy/carb modified Room service appropriate?: Yes; Fluid consistency:: Thin Fluids: None DVT Prophylaxis: Heparin  Code Status: Full Code Family Communication: None at bedise  Disposition Plan: Discharge back to [facility] when ready  Consultants:  None  Procedures:  Unna boot - LLE  Antibiotics:  Vancomycin 07/17/2015 >>  Zosyn 07/17/2015 >>  Studies  Dg Chest Port 1 View  07/18/2015   CLINICAL DATA:  Shortness of breath and sepsis  EXAM: PORTABLE CHEST 1 VIEW  COMPARISON:  07/17/2015  FINDINGS: There is mild cardiac enlargement. Pulmonary vascular congestion noted. No edema or pleural effusion. No airspace consolidation.  IMPRESSION: 1. Cardiac enlargement and pulmonary vascular congestion.   Electronically Signed   By: Kerby Moors M.D.   On: 07/18/2015 14:14   Dg  Chest Portable 1 View  07/17/2015   CLINICAL DATA:  Acute onset of fever up to 102 degrees Fahrenheit earlier today, associated with hypotension. Current history of chronic diastolic heart failure, hypertension and diabetes.  EXAM: PORTABLE CHEST 1 VIEW  COMPARISON:  06/27/2014 and earlier.  FINDINGS: Suboptimal inspiration due to body habitus accounts for crowded bronchovascular  markings diffusely, atelectasis in the lung bases and accentuates the cardiac silhouette. Taking this into account, cardiac silhouette moderately enlarged, unchanged. Pulmonary venous hypertension without overt pulmonary edema. No confluent airspace consolidation.  IMPRESSION: 1. Suboptimal inspiration accounts for bibasilar atelectasis. No acute cardiopulmonary disease otherwise. 2. Stable moderate cardiomegaly. Pulmonary venous hypertension without overt edema.   Electronically Signed   By: Evangeline Dakin M.D.   On: 07/17/2015 18:15   Objective  Filed Vitals:   07/18/15 2349 07/19/15 0400 07/19/15 0740 07/19/15 0848  BP:  98/52  93/47  Pulse:  78  93  Temp: 99 F (37.2 C) 98.4 F (36.9 C)  98.5 F (36.9 C)  TempSrc: Axillary Axillary  Oral  Resp:  22    Weight:  151 kg (332 lb 14.3 oz)    SpO2:  95% 97% 92%    Intake/Output Summary (Last 24 hours) at 07/19/15 1316 Last data filed at 07/19/15 0903  Gross per 24 hour  Intake   1360 ml  Output    550 ml  Net    810 ml   Filed Weights   07/18/15 0407 07/19/15 0400  Weight: 149.143 kg (328 lb 12.8 oz) 151 kg (332 lb 14.3 oz)   Exam:  GENERAL: NAD, morbidly obese  HEENT: head NCAT, no scleral icterus. Mucous membranes are moist.   NECK: Supple.   LUNGS: Clear to auscultation b/l  HEART: Regular rate and rhythm without murmur. No JVD.  ABDOMEN: Soft, nontender, and nondistended. Positive bowel sounds.  EXTREMITIES: Without any cyanosis or clubbing. Some erythema and edema on L leg above knee. Unna boot in place on L lower leg. Right BKA stump without erythema or lesions.  NEUROLOGIC: Alert and oriented. Grossly nonfocal exam, limited by patient's body habitus and limited mobility.  PSYCHIATRIC: Normal mood and affect  Data Reviewed: Basic Metabolic Panel:  Recent Labs Lab 07/17/15 1755 07/18/15 0049 07/19/15 0536  NA 141  --  139  K 4.4  --  3.9  CL 98*  --  97*  CO2 34*  --  32  GLUCOSE 208*  --  161*  BUN  25*  --  19  CREATININE 1.67* 1.72* 1.71*  CALCIUM 8.9  --  8.4*   Liver Function Tests:  Recent Labs Lab 07/17/15 1755  AST 24  ALT 20  ALKPHOS 78  BILITOT 0.9  PROT 7.5  ALBUMIN 2.8*   CBC:  Recent Labs Lab 07/17/15 1755 07/18/15 0049 07/19/15 0536  WBC 17.7* 13.8* 8.3  HGB 12.0 11.8* 11.4*  HCT 39.6 39.1 38.5  MCV 93.4 93.1 94.8  PLT 185 120* 152   CBG:  Recent Labs Lab 07/18/15 1135 07/18/15 1644 07/18/15 2040 07/19/15 0752 07/19/15 1129  GLUCAP 134* 142* 196* 162* 281*   Recent Results (from the past 240 hour(s))  Blood culture (routine x 2)     Status: None (Preliminary result)   Collection Time: 07/17/15  5:55 PM  Result Value Ref Range Status   Specimen Description BLOOD LEFT ANTECUBITAL  Final   Special Requests BOTTLES DRAWN AEROBIC AND ANAEROBIC 5CC  Final   Culture NO GROWTH 2 DAYS  Final   Report Status PENDING  Incomplete  Blood culture (routine x 2)     Status: None (Preliminary result)   Collection Time: 07/17/15  5:59 PM  Result Value Ref Range Status   Specimen Description BLOOD BLOOD RIGHT FOREARM  Final   Special Requests BOTTLES DRAWN AEROBIC ONLY 4CC  Final   Culture NO GROWTH 2 DAYS  Final   Report Status PENDING  Incomplete  Urine culture     Status: None   Collection Time: 07/17/15  7:40 PM  Result Value Ref Range Status   Specimen Description URINE, CATHETERIZED  Final   Special Requests Normal  Final   Culture NO GROWTH 1 DAY  Final   Report Status 07/18/2015 FINAL  Final  MRSA PCR Screening     Status: None   Collection Time: 07/18/15 12:21 AM  Result Value Ref Range Status   MRSA by PCR NEGATIVE NEGATIVE Final    Comment:        The GeneXpert MRSA Assay (FDA approved for NASAL specimens only), is one component of a comprehensive MRSA colonization surveillance program. It is not intended to diagnose MRSA infection nor to guide or monitor treatment for MRSA infections.     Scheduled Meds: . antiseptic oral rinse   7 mL Mouth Rinse q12n4p  . chlorhexidine  15 mL Mouth Rinse BID  . DULoxetine  40 mg Oral Daily  . gabapentin  600 mg Oral BID  . heparin  5,000 Units Subcutaneous 3 times per day  . insulin aspart  0-20 Units Subcutaneous TID WC  . insulin aspart  0-5 Units Subcutaneous QHS  . mometasone-formoterol  2 puff Inhalation BID  . piperacillin-tazobactam (ZOSYN)  IV  3.375 g Intravenous Q8H  . sodium chloride  3 mL Intravenous Q12H  . vancomycin  1,250 mg Intravenous Q24H   Continuous Infusions:    Merrie Roof, Student-PA  Marzetta Board, MD [facility] Pager [phone]. If 7 PM - 7 AM, please contact night-coverage at [email or web address], password [facility] 07/19/2015, 1:16 PM  LOS: 2 days

## 2015-07-20 ENCOUNTER — Encounter (HOSPITAL_BASED_OUTPATIENT_CLINIC_OR_DEPARTMENT_OTHER): Payer: Medicare Other | Attending: Internal Medicine

## 2015-07-20 DIAGNOSIS — J9602 Acute respiratory failure with hypercapnia: Secondary | ICD-10-CM

## 2015-07-20 LAB — BASIC METABOLIC PANEL
Anion gap: 6 (ref 5–15)
BUN: 15 mg/dL (ref 6–20)
CALCIUM: 8.5 mg/dL — AB (ref 8.9–10.3)
CO2: 35 mmol/L — AB (ref 22–32)
CREATININE: 1.5 mg/dL — AB (ref 0.44–1.00)
Chloride: 98 mmol/L — ABNORMAL LOW (ref 101–111)
GFR calc non Af Amer: 34 mL/min — ABNORMAL LOW (ref >60)
GFR, EST AFRICAN AMERICAN: 40 mL/min — AB (ref >60)
GLUCOSE: 200 mg/dL — AB (ref 65–99)
Potassium: 4 mmol/L (ref 3.5–5.1)
Sodium: 139 mmol/L (ref 135–145)

## 2015-07-20 LAB — CBC
HEMATOCRIT: 35.2 % — AB (ref 36.0–46.0)
Hemoglobin: 10.6 g/dL — ABNORMAL LOW (ref 12.0–15.0)
MCH: 28.3 pg (ref 26.0–34.0)
MCHC: 30.1 g/dL (ref 30.0–36.0)
MCV: 93.9 fL (ref 78.0–100.0)
Platelets: 140 10*3/uL — ABNORMAL LOW (ref 150–400)
RBC: 3.75 MIL/uL — ABNORMAL LOW (ref 3.87–5.11)
RDW: 15.5 % (ref 11.5–15.5)
WBC: 5.5 10*3/uL (ref 4.0–10.5)

## 2015-07-20 LAB — GLUCOSE, CAPILLARY
Glucose-Capillary: 178 mg/dL — ABNORMAL HIGH (ref 65–99)
Glucose-Capillary: 199 mg/dL — ABNORMAL HIGH (ref 65–99)
Glucose-Capillary: 220 mg/dL — ABNORMAL HIGH (ref 65–99)
Glucose-Capillary: 223 mg/dL — ABNORMAL HIGH (ref 65–99)

## 2015-07-20 MED ORDER — TORSEMIDE 20 MG PO TABS
20.0000 mg | ORAL_TABLET | Freq: Two times a day (BID) | ORAL | Status: DC
  Administered 2015-07-20 – 2015-07-21 (×2): 20 mg via ORAL
  Filled 2015-07-20 (×2): qty 1

## 2015-07-20 NOTE — Progress Notes (Signed)
PROGRESS NOTE  Heather Zimmerman. Heather Zimmerman ZGY:[id number] DOB: 09-18-1946 DOA: 07/17/2015 PCP: Gildardo Cranker, DO   HPI:  69 y/o female presented to the ED with altered mental status from nursing home. Per admitting provider, patient was noted by nursing home staff to be lethargic and difficult to arouse. They report frequent non-compliance with home O2 while sleeping and is difficult to rouse at baseline. Symptoms were acutely worse on day of admission. Staff reportedly also noticed fever.   In the ED, she was found to be hypotensive at 72/41, febrile at 102.2, and with a WBC count of 17.7. She was admitted for sepsis and acute encephalopathy to the hospitalist service.  Subjective / 24 H Interval events:   - left thigh above the knee.  - Denies CP, cough, SOB, N/V/D, chills, and abdominal pain.   Assessment/Plan: Principal Problem:   Acute encephalopathy Active Problems:   Essential hypertension, benign   Morbid obesity (HCC)   Hx of right BKA (HCC)   Venous ulcer of left leg (HCC)   Fever   Diabetes mellitus with complication ([place])   Open wound of left lower extremity   Acute respiratory failure (HCC)   Sepsis ([place])   Urinary retention   Cellulitis of leg, left   Fever/sepsis due to LLE cellulitis - Presented with fever of 102.2, WBC of 17.7, and hypotensive at 72/41. Possible source identified as cellulitis of left leg above knee evidenced by pain, warmth, and redness to area.  - U/A and chest x-ray negative for infection, and patient denies diarrhea - Patient is now afebrile with no leukocytosis - still tender left thigh, continue IV antibiotics - cultures remained negative  Acute encephalopathy  - Likely toxic due to above, resolved - Patient alert and oriented, answering questions appropriately - Continue to monitor  Acute kidney injury on CKD III - hold Cozaar - resume home demadex today as BP better - Cr at baseline  HTN - initial hypotension resolving, restart Demadex  today   Diabetes Mellitus - HgA1c of 7.8.  - continue Lantus / SSI.   Venous Stasis Dermatitis - With LLE ulceration, Unna boot in place - Continue to monitor  Morbid Obesity - Nutrition consult - counseled for weight loss  Urinary Retention - Continue foley, voiding trial prior to discharge - U/A negative for infection  Obstructive Sleep Apnea  - Non-compliant with CPAP   Diet: Diet heart healthy/carb modified Room service appropriate?: Yes; Fluid consistency:: Thin Fluids: None DVT Prophylaxis: Heparin  Code Status: Full Code Family Communication: None at bedise  Disposition Plan: Discharge back to [facility] when ready  Consultants:  None  Procedures:  Unna boot - LLE  Antibiotics:  Vancomycin 07/17/2015 >>  Zosyn 07/17/2015 >>  Studies  Dg Chest Port 1 View  07/18/2015   CLINICAL DATA:  Shortness of breath and sepsis  EXAM: PORTABLE CHEST 1 VIEW  COMPARISON:  07/17/2015  FINDINGS: There is mild cardiac enlargement. Pulmonary vascular congestion noted. No edema or pleural effusion. No airspace consolidation.  IMPRESSION: 1. Cardiac enlargement and pulmonary vascular congestion.   Electronically Signed   By: Kerby Moors M.D.   On: 07/18/2015 14:14   Objective  Filed Vitals:   07/20/15 1207 07/20/15 1208 07/20/15 1209 07/20/15 1210  BP:    118/63  Pulse: 79 81 79 82  Temp:    98.6 F (37 C)  TempSrc:    Oral  Resp: 17 20 20 23   Weight:      SpO2: 96%  97% 97% 94%    Intake/Output Summary (Last 24 hours) at 07/20/15 1346 Last data filed at 07/20/15 0908  Gross per 24 hour  Intake    770 ml  Output   1550 ml  Net   -780 ml   Filed Weights   07/18/15 0407 07/19/15 0400 07/20/15 0400  Weight: 149.143 kg (328 lb 12.8 oz) 151 kg (332 lb 14.3 oz) 153 kg (337 lb 4.9 oz)   Exam:  GENERAL: NAD, morbidly obese  HEENT: head NCAT, no scleral icterus. Mucous membranes are moist.   NECK: Supple.   LUNGS: Clear to auscultation b/l  HEART:  Regular rate and rhythm without murmur. No JVD.  ABDOMEN: Soft, nontender, and nondistended. Positive bowel sounds.  EXTREMITIES: Without any cyanosis or clubbing. Some erythema and edema on L leg above knee. Unna boot in place on L lower leg. Right BKA stump without erythema or lesions.  NEUROLOGIC: Alert and oriented. Grossly nonfocal exam, limited by patient's body habitus and limited mobility.  PSYCHIATRIC: Normal mood and affect  Data Reviewed: Basic Metabolic Panel:  Recent Labs Lab 07/17/15 1755 07/18/15 0049 07/19/15 0536 07/20/15 0449  NA 141  --  139 139  K 4.4  --  3.9 4.0  CL 98*  --  97* 98*  CO2 34*  --  32 35*  GLUCOSE 208*  --  161* 200*  BUN 25*  --  19 15  CREATININE 1.67* 1.72* 1.71* 1.50*  CALCIUM 8.9  --  8.4* 8.5*   Liver Function Tests:  Recent Labs Lab 07/17/15 1755  AST 24  ALT 20  ALKPHOS 78  BILITOT 0.9  PROT 7.5  ALBUMIN 2.8*   CBC:  Recent Labs Lab 07/17/15 1755 07/18/15 0049 07/19/15 0536 07/20/15 0449  WBC 17.7* 13.8* 8.3 5.5  HGB 12.0 11.8* 11.4* 10.6*  HCT 39.6 39.1 38.5 35.2*  MCV 93.4 93.1 94.8 93.9  PLT 185 120* 152 140*   CBG:  Recent Labs Lab 07/19/15 1129 07/19/15 1649 07/19/15 2109 07/20/15 0739 07/20/15 1123  GLUCAP 281* 193* 258* 178* 199*   Recent Results (from the past 240 hour(s))  Blood culture (routine x 2)     Status: None (Preliminary result)   Collection Time: 07/17/15  5:55 PM  Result Value Ref Range Status   Specimen Description BLOOD LEFT ANTECUBITAL  Final   Special Requests BOTTLES DRAWN AEROBIC AND ANAEROBIC 5CC  Final   Culture NO GROWTH 3 DAYS  Final   Report Status PENDING  Incomplete  Blood culture (routine x 2)     Status: None (Preliminary result)   Collection Time: 07/17/15  5:59 PM  Result Value Ref Range Status   Specimen Description BLOOD BLOOD RIGHT FOREARM  Final   Special Requests BOTTLES DRAWN AEROBIC ONLY 4CC  Final   Culture NO GROWTH 3 DAYS  Final   Report Status  PENDING  Incomplete  Urine culture     Status: None   Collection Time: 07/17/15  7:40 PM  Result Value Ref Range Status   Specimen Description URINE, CATHETERIZED  Final   Special Requests Normal  Final   Culture NO GROWTH 1 DAY  Final   Report Status 07/18/2015 FINAL  Final  MRSA PCR Screening     Status: None   Collection Time: 07/18/15 12:21 AM  Result Value Ref Range Status   MRSA by PCR NEGATIVE NEGATIVE Final    Comment:        The GeneXpert MRSA Assay (FDA approved  for NASAL specimens only), is one component of a comprehensive MRSA colonization surveillance program. It is not intended to diagnose MRSA infection nor to guide or monitor treatment for MRSA infections.     Scheduled Meds: . antiseptic oral rinse  7 mL Mouth Rinse q12n4p  . chlorhexidine  15 mL Mouth Rinse BID  . DULoxetine  40 mg Oral Daily  . gabapentin  600 mg Oral BID  . heparin  5,000 Units Subcutaneous 3 times per day  . insulin aspart  0-20 Units Subcutaneous TID WC  . insulin aspart  0-5 Units Subcutaneous QHS  . insulin glargine  5 Units Subcutaneous QHS  . mometasone-formoterol  2 puff Inhalation BID  . piperacillin-tazobactam (ZOSYN)  IV  3.375 g Intravenous Q8H  . sodium chloride  3 mL Intravenous Q12H  . vancomycin  1,250 mg Intravenous Q24H   Continuous Infusions:   Marzetta Board, MD [facility] Pager [phone]. If 7 PM - 7 AM, please contact night-coverage at [email or web address], password [facility] 07/20/2015, 1:46 PM  LOS: 3 days

## 2015-07-20 NOTE — Care Management Important Message (Signed)
Important Message  Patient Details  Name: Heather Zimmerman MRN: [id number] Date of Birth: November 23, 1945   Medicare Important Message Given:  Yes-second notification given    Nathen May 07/20/2015, 11:18 AM

## 2015-07-21 DIAGNOSIS — E870 Hyperosmolality and hypernatremia: Secondary | ICD-10-CM | POA: Diagnosis not present

## 2015-07-21 DIAGNOSIS — N183 Chronic kidney disease, stage 3 unspecified: Secondary | ICD-10-CM | POA: Diagnosis present

## 2015-07-21 DIAGNOSIS — J9620 Acute and chronic respiratory failure, unspecified whether with hypoxia or hypercapnia: Secondary | ICD-10-CM | POA: Diagnosis present

## 2015-07-21 DIAGNOSIS — I83029 Varicose veins of left lower extremity with ulcer of unspecified site: Secondary | ICD-10-CM | POA: Diagnosis not present

## 2015-07-21 DIAGNOSIS — E119 Type 2 diabetes mellitus without complications: Secondary | ICD-10-CM | POA: Diagnosis not present

## 2015-07-21 DIAGNOSIS — E1149 Type 2 diabetes mellitus with other diabetic neurological complication: Secondary | ICD-10-CM | POA: Diagnosis not present

## 2015-07-21 DIAGNOSIS — G9340 Encephalopathy, unspecified: Secondary | ICD-10-CM | POA: Diagnosis not present

## 2015-07-21 DIAGNOSIS — G4733 Obstructive sleep apnea (adult) (pediatric): Secondary | ICD-10-CM | POA: Diagnosis not present

## 2015-07-21 DIAGNOSIS — Z89511 Acquired absence of right leg below knee: Secondary | ICD-10-CM | POA: Diagnosis not present

## 2015-07-21 DIAGNOSIS — J9622 Acute and chronic respiratory failure with hypercapnia: Secondary | ICD-10-CM | POA: Diagnosis not present

## 2015-07-21 DIAGNOSIS — Z89519 Acquired absence of unspecified leg below knee: Secondary | ICD-10-CM | POA: Diagnosis not present

## 2015-07-21 DIAGNOSIS — G8929 Other chronic pain: Secondary | ICD-10-CM | POA: Diagnosis not present

## 2015-07-21 DIAGNOSIS — E118 Type 2 diabetes mellitus with unspecified complications: Secondary | ICD-10-CM | POA: Diagnosis not present

## 2015-07-21 DIAGNOSIS — I5032 Chronic diastolic (congestive) heart failure: Secondary | ICD-10-CM | POA: Diagnosis not present

## 2015-07-21 DIAGNOSIS — E1143 Type 2 diabetes mellitus with diabetic autonomic (poly)neuropathy: Secondary | ICD-10-CM | POA: Diagnosis not present

## 2015-07-21 DIAGNOSIS — J9610 Chronic respiratory failure, unspecified whether with hypoxia or hypercapnia: Secondary | ICD-10-CM | POA: Diagnosis not present

## 2015-07-21 DIAGNOSIS — E8770 Fluid overload, unspecified: Secondary | ICD-10-CM | POA: Diagnosis not present

## 2015-07-21 DIAGNOSIS — N189 Chronic kidney disease, unspecified: Secondary | ICD-10-CM | POA: Diagnosis not present

## 2015-07-21 DIAGNOSIS — F419 Anxiety disorder, unspecified: Secondary | ICD-10-CM | POA: Diagnosis not present

## 2015-07-21 DIAGNOSIS — I509 Heart failure, unspecified: Secondary | ICD-10-CM | POA: Diagnosis not present

## 2015-07-21 DIAGNOSIS — D649 Anemia, unspecified: Secondary | ICD-10-CM | POA: Diagnosis not present

## 2015-07-21 DIAGNOSIS — I83022 Varicose veins of left lower extremity with ulcer of calf: Secondary | ICD-10-CM | POA: Diagnosis not present

## 2015-07-21 DIAGNOSIS — I130 Hypertensive heart and chronic kidney disease with heart failure and stage 1 through stage 4 chronic kidney disease, or unspecified chronic kidney disease: Secondary | ICD-10-CM | POA: Diagnosis not present

## 2015-07-21 DIAGNOSIS — I87312 Chronic venous hypertension (idiopathic) with ulcer of left lower extremity: Secondary | ICD-10-CM | POA: Diagnosis not present

## 2015-07-21 DIAGNOSIS — J810 Acute pulmonary edema: Secondary | ICD-10-CM | POA: Diagnosis not present

## 2015-07-21 DIAGNOSIS — R6521 Severe sepsis with septic shock: Secondary | ICD-10-CM | POA: Diagnosis not present

## 2015-07-21 DIAGNOSIS — B9562 Methicillin resistant Staphylococcus aureus infection as the cause of diseases classified elsewhere: Secondary | ICD-10-CM | POA: Diagnosis not present

## 2015-07-21 DIAGNOSIS — L97229 Non-pressure chronic ulcer of left calf with unspecified severity: Secondary | ICD-10-CM | POA: Diagnosis not present

## 2015-07-21 DIAGNOSIS — N179 Acute kidney failure, unspecified: Secondary | ICD-10-CM | POA: Diagnosis not present

## 2015-07-21 DIAGNOSIS — F329 Major depressive disorder, single episode, unspecified: Secondary | ICD-10-CM | POA: Diagnosis not present

## 2015-07-21 DIAGNOSIS — I10 Essential (primary) hypertension: Secondary | ICD-10-CM | POA: Diagnosis not present

## 2015-07-21 DIAGNOSIS — L03116 Cellulitis of left lower limb: Secondary | ICD-10-CM | POA: Diagnosis not present

## 2015-07-21 DIAGNOSIS — D6949 Other primary thrombocytopenia: Secondary | ICD-10-CM | POA: Diagnosis not present

## 2015-07-21 DIAGNOSIS — E559 Vitamin D deficiency, unspecified: Secondary | ICD-10-CM | POA: Diagnosis not present

## 2015-07-21 LAB — CBC
HEMATOCRIT: 35.3 % — AB (ref 36.0–46.0)
HEMOGLOBIN: 10.6 g/dL — AB (ref 12.0–15.0)
MCH: 28 pg (ref 26.0–34.0)
MCHC: 30 g/dL (ref 30.0–36.0)
MCV: 93.4 fL (ref 78.0–100.0)
Platelets: 151 10*3/uL (ref 150–400)
RBC: 3.78 MIL/uL — ABNORMAL LOW (ref 3.87–5.11)
RDW: 15.2 % (ref 11.5–15.5)
WBC: 5.8 10*3/uL (ref 4.0–10.5)

## 2015-07-21 LAB — BASIC METABOLIC PANEL
Anion gap: 8 (ref 5–15)
BUN: 16 mg/dL (ref 6–20)
CHLORIDE: 95 mmol/L — AB (ref 101–111)
CO2: 35 mmol/L — AB (ref 22–32)
CREATININE: 1.6 mg/dL — AB (ref 0.44–1.00)
Calcium: 8.7 mg/dL — ABNORMAL LOW (ref 8.9–10.3)
GFR calc Af Amer: 37 mL/min — ABNORMAL LOW (ref >60)
GFR calc non Af Amer: 32 mL/min — ABNORMAL LOW (ref >60)
GLUCOSE: 176 mg/dL — AB (ref 65–99)
Potassium: 4.3 mmol/L (ref 3.5–5.1)
Sodium: 138 mmol/L (ref 135–145)

## 2015-07-21 LAB — GLUCOSE, CAPILLARY
GLUCOSE-CAPILLARY: 166 mg/dL — AB (ref 65–99)
Glucose-Capillary: 222 mg/dL — ABNORMAL HIGH (ref 65–99)

## 2015-07-21 MED ORDER — DOXYCYCLINE HYCLATE 100 MG PO CAPS: 100.0000 mg | ORAL_CAPSULE | Freq: Two times a day (BID) | ORAL | Status: DC

## 2015-07-21 MED ORDER — HYDROCODONE-ACETAMINOPHEN 5-325 MG PO TABS: 1.0000 | ORAL_TABLET | ORAL | Status: DC | PRN

## 2015-07-21 NOTE — Discharge Summary (Addendum)
Physician Discharge Summary  Heather Zimmerman. Heather Zimmerman Heather Zimmerman:[id number] DOB: 1946/08/30 DOA: 07/17/2015  PCP: Heather Cranker, DO  Admit date: 07/17/2015 Discharge date: 07/21/2015  Time spent: > 35 minutes  Recommendations for Outpatient Follow-up:  1. Follow up with Dr. Eulas Zimmerman in 1-2 weeks 2. Continue Doxycycline for right thigh cellulitis for 10 additional days, end date 07/31/15 3. Her Losartan was held on discharge due to CKD 4. Please repeat renal function on 3-4 days   Discharge Diagnoses:  Principal Problem:   Acute encephalopathy Active Problems:   Essential hypertension, benign   Morbid obesity (HCC)   Hx of right BKA (HCC)   Venous ulcer of left leg (HCC)   Fever   Diabetes mellitus with complication (HCC)   Open wound of left lower extremity   Acute respiratory failure (HCC)   Sepsis ([place])   Urinary retention   Cellulitis of leg, left   CKD (chronic kidney disease) stage 3, GFR 30-59 ml/min   Acute on chronic respiratory failure ([place])  Discharge Condition: stable  Diet recommendation: heart healthy  Filed Weights   07/19/15 0400 07/20/15 0400 07/21/15 0343  Weight: 151 kg (332 lb 14.3 oz) 153 kg (337 lb 4.9 oz) 153.4 kg (338 lb 3 oz)   History of present illness:  Heather Zimmerman. Heather Zimmerman is a 69 y.o. female Level V caveat: Patient presenting in altered mental state from nursing home. Patient will wake up briefly in order to provide very basic answers to questions which are sometimes appropriate and sometimes not. No family available for consultation. History provided in limited amount by patient and by ED physician. Patient was noted today by nursing home facility staff to be lethargic and difficult to arouse. The patient often times is noncompliant with her home oxygen while sleeping and is difficult to rales at baseline. Today symptoms are constant and worse than normal. There are no other reported recent complaints of the nursing home staff other than fever. Nothing was given to  alleviate patient's symptoms.   Hospital Course:  Fever/sepsis due to LLE cellulitis - Presented with fever of 102.2, WBC of 17.7, and hypotensive at 72/41. Possible source identified as cellulitis of left leg above knee evidenced by pain, warmth, and redness to area. She has received 4 days of broad spectrum antibiotics with Vancomycin and Zosyn with significant improvement in her cellulitis, decreased swelling, and her erythema has resolved as well as her leukocytosis. She was transitioned to Doxycycline and will need 10 additional days of antibiotics.  Acute encephalopathy - Likely toxic due to above, resolved HTN - bert blood pressure has normalized, will hold Losartan due to CKD CKD stage III - Creatinine overall stable and close to baseline. Continue Demadex, stop ARB, monitor renal function in 3-4 days after d/c Diabetes Mellitus - HgA1c of 7.8. On 20 units nightly Lantus and SSI at home Venous Stasis Dermatitis - With LLE ulceration, Unna boot in place, outpatient management as per wound care.  Morbid Obesity - Nutrition consult, Discussed importance of weight loss for her overall health and well-being, patient aware of need to lose weight.  Obstructive Sleep Apnea with acute on chronic hypoxic and hypercarbic respiratory failure - Non-compliant with CPAP, does voice desire to resume using nightly, Encourage CPAP use for sleep  Procedures:  None    Consultations:  None   Discharge Exam: Filed Vitals:   07/20/15 2356 07/21/15 0343 07/21/15 0736 07/21/15 0746  BP: 120/54 109/69    Pulse: 76 92    Temp: 98.3  F (36.8 C) 98.9 F (37.2 C) 98.2 F (36.8 C)   TempSrc: Axillary Axillary Oral   Resp: 17 21    Weight:  153.4 kg (338 lb 3 oz)    SpO2: 94% 94%  91%   General: NAD Cardiovascular: RRR Respiratory: CTA biL  Discharge Instructions    Medication List    STOP taking these medications        losartan 25 MG tablet  Commonly known as:  COZAAR      TAKE these  medications        acetaminophen 160 MG/5ML solution  Commonly known as:  TYLENOL  Place 20.3 mLs (650 mg total) into feeding tube every 6 (six) hours as needed for fever.     aspirin 81 MG tablet  Take 81 mg by mouth daily.     camphor-menthol lotion  Commonly known as:  SARNA  Apply topically as needed for itching.     cetirizine-pseudoephedrine 5-120 MG tablet  Commonly known as:  ZYRTEC-D  Take 1 tablet by mouth 2 (two) times daily.     cholecalciferol 1000 UNITS tablet  Commonly known as:  VITAMIN D  Take 2,000 Units by mouth daily.     doxycycline 100 MG capsule  Commonly known as:  VIBRAMYCIN  Take 1 capsule (100 mg total) by mouth 2 (two) times daily. For 10 additional days. End date 07/31/2015     DULoxetine 20 MG capsule  Commonly known as:  CYMBALTA  Take 40 mg by mouth daily.     FLONASE NA  Place 1 spray into the nose daily as needed. For nasal congestion     Fluticasone-Salmeterol 250-50 MCG/DOSE Aepb  Commonly known as:  ADVAIR  Inhale 1 puff into the lungs 2 (two) times daily.     gabapentin 600 MG tablet  Commonly known as:  NEURONTIN  Take 600 mg by mouth 2 (two) times daily.     HYDROcodone-acetaminophen 5-325 MG tablet  Commonly known as:  NORCO/VICODIN  Take 1 tablet by mouth every 4 (four) hours as needed for moderate pain or severe pain.     insulin aspart 100 UNIT/ML injection  Commonly known as:  novoLOG  Inject 5 Units into the skin 3 (three) times daily before meals. With an additional 5 units for cg >=150     ipratropium-albuterol 0.5-2.5 (3) MG/3ML Soln  Commonly known as:  DUONEB  Take 3 mLs by nebulization every 6 (six) hours as needed (shortness of breath).     LANTUS SOLOSTAR 100 UNIT/ML Solostar Pen  Generic drug:  Insulin Glargine  Inject 20 Units into the skin at bedtime.     magnesium oxide 400 MG tablet  Commonly known as:  MAG-OX  Take 400 mg by mouth every 8 (eight) hours.     omeprazole 20 MG capsule  Commonly known  as:  PRILOSEC  Take 20 mg by mouth daily.     potassium chloride SA 20 MEQ tablet  Commonly known as:  K-DUR,KLOR-CON  Take 20 mEq by mouth daily.     torsemide 20 MG tablet  Commonly known as:  DEMADEX  Take 1 tablet (20 mg total) by mouth 2 (two) times daily.       Follow-up Information    Follow up with Heather Cranker, DO. Schedule an appointment as soon as possible for a visit in 1 week.   Specialty:  Internal Medicine   Contact information:   [address] [address] [phone]  The results of significant diagnostics from this hospitalization (including imaging, microbiology, ancillary and laboratory) are listed below for reference.    Significant Diagnostic Studies: Dg Chest Port 1 View  07/18/2015   CLINICAL DATA:  Shortness of breath and sepsis  EXAM: PORTABLE CHEST 1 VIEW  COMPARISON:  07/17/2015  FINDINGS: There is mild cardiac enlargement. Pulmonary vascular congestion noted. No edema or pleural effusion. No airspace consolidation.  IMPRESSION: 1. Cardiac enlargement and pulmonary vascular congestion.   Electronically Signed   By: Kerby Moors M.D.   On: 07/18/2015 14:14   Dg Chest Portable 1 View  07/17/2015   CLINICAL DATA:  Acute onset of fever up to 102 degrees Fahrenheit earlier today, associated with hypotension. Current history of chronic diastolic heart failure, hypertension and diabetes.  EXAM: PORTABLE CHEST 1 VIEW  COMPARISON:  06/27/2014 and earlier.  FINDINGS: Suboptimal inspiration due to body habitus accounts for crowded bronchovascular markings diffusely, atelectasis in the lung bases and accentuates the cardiac silhouette. Taking this into account, cardiac silhouette moderately enlarged, unchanged. Pulmonary venous hypertension without overt pulmonary edema. No confluent airspace consolidation.  IMPRESSION: 1. Suboptimal inspiration accounts for bibasilar atelectasis. No acute cardiopulmonary disease otherwise. 2. Stable moderate  cardiomegaly. Pulmonary venous hypertension without overt edema.   Electronically Signed   By: Evangeline Dakin M.D.   On: 07/17/2015 18:15   Microbiology: Recent Results (from the past 240 hour(s))  Blood culture (routine x 2)     Status: None (Preliminary result)   Collection Time: 07/17/15  5:55 PM  Result Value Ref Range Status   Specimen Description BLOOD LEFT ANTECUBITAL  Final   Special Requests BOTTLES DRAWN AEROBIC AND ANAEROBIC 5CC  Final   Culture NO GROWTH 3 DAYS  Final   Report Status PENDING  Incomplete  Blood culture (routine x 2)     Status: None (Preliminary result)   Collection Time: 07/17/15  5:59 PM  Result Value Ref Range Status   Specimen Description BLOOD BLOOD RIGHT FOREARM  Final   Special Requests BOTTLES DRAWN AEROBIC ONLY 4CC  Final   Culture NO GROWTH 3 DAYS  Final   Report Status PENDING  Incomplete  Urine culture     Status: None   Collection Time: 07/17/15  7:40 PM  Result Value Ref Range Status   Specimen Description URINE, CATHETERIZED  Final   Special Requests Normal  Final   Culture NO GROWTH 1 DAY  Final   Report Status 07/18/2015 FINAL  Final  MRSA PCR Screening     Status: None   Collection Time: 07/18/15 12:21 AM  Result Value Ref Range Status   MRSA by PCR NEGATIVE NEGATIVE Final    Comment:        The GeneXpert MRSA Assay (FDA approved for NASAL specimens only), is one component of a comprehensive MRSA colonization surveillance program. It is not intended to diagnose MRSA infection nor to guide or monitor treatment for MRSA infections.     Labs: Basic Metabolic Panel:  Recent Labs Lab 07/17/15 1755 07/18/15 0049 07/19/15 0536 07/20/15 0449 07/21/15 0332  NA 141  --  139 139 138  K 4.4  --  3.9 4.0 4.3  CL 98*  --  97* 98* 95*  CO2 34*  --  32 35* 35*  GLUCOSE 208*  --  161* 200* 176*  BUN 25*  --  19 15 16   CREATININE 1.67* 1.72* 1.71* 1.50* 1.60*  CALCIUM 8.9  --  8.4* 8.5* 8.7*   Liver  Function Tests:  Recent  Labs Lab 07/17/15 1755  AST 24  ALT 20  ALKPHOS 78  BILITOT 0.9  PROT 7.5  ALBUMIN 2.8*   CBC:  Recent Labs Lab 07/17/15 1755 07/18/15 0049 07/19/15 0536 07/20/15 0449 07/21/15 0332  WBC 17.7* 13.8* 8.3 5.5 5.8  HGB 12.0 11.8* 11.4* 10.6* 10.6*  HCT 39.6 39.1 38.5 35.2* 35.3*  MCV 93.4 93.1 94.8 93.9 93.4  PLT 185 120* 152 140* 151   CBG:  Recent Labs Lab 07/20/15 0739 07/20/15 1123 07/20/15 1651 07/20/15 2113 07/21/15 0738  GLUCAP 178* 199* 220* 223* 166*    Signed:  Latresha Yahr  [facility] 07/21/2015, 9:38 AM

## 2015-07-21 NOTE — Discharge Instructions (Signed)
Follow with Gildardo Cranker, DO in 5-7 days  Please get a complete blood count and chemistry panel checked by your Primary MD at your next visit, and again as instructed by your Primary MD. Please get your medications reviewed and adjusted by your Primary MD.  Please request your Primary MD to go over all Hospital Tests and Procedure/Radiological results at the follow up, please get all Hospital records sent to your Prim MD by signing hospital release before you go home.  If you had Pneumonia of Lung problems at the Hospital: Please get a 2 view Chest X ray done in 6-8 weeks after hospital discharge or sooner if instructed by your Primary MD.  If you have Congestive Heart Failure: Please call your Cardiologist or Primary MD anytime you have any of the following symptoms:  1) 3 pound weight gain in 24 hours or 5 pounds in 1 week  2) shortness of breath, with or without a dry hacking cough  3) swelling in the hands, feet or stomach  4) if you have to sleep on extra pillows at night in order to breathe  Follow cardiac low salt diet and 1.5 lit/day fluid restriction.  If you have diabetes Accuchecks 4 times/day, Once in AM empty stomach and then before each meal. Log in all results and show them to your primary doctor at your next visit. If any glucose reading is under 80 or above 300 call your primary MD immediately.  If you have Seizure/Convulsions/Epilepsy: Please do not drive, operate heavy machinery, participate in activities at heights or participate in high speed sports until you have seen by Primary MD or a Neurologist and advised to do so again.  If you had Gastrointestinal Bleeding: Please ask your Primary MD to check a complete blood count within one week of discharge or at your next visit. Your endoscopic/colonoscopic biopsies that are pending at the time of discharge, will also need to followed by your Primary MD.  Get Medicines reviewed and adjusted. Please take all your  medications with you for your next visit with your Primary MD  Please request your Primary MD to go over all hospital tests and procedure/radiological results at the follow up, please ask your Primary MD to get all Hospital records sent to his/her office.  If you experience worsening of your admission symptoms, develop shortness of breath, life threatening emergency, suicidal or homicidal thoughts you must seek medical attention immediately by calling 911 or calling your MD immediately  if symptoms less severe.  You must read complete instructions/literature along with all the possible adverse reactions/side effects for all the Medicines you take and that have been prescribed to you. Take any new Medicines after you have completely understood and accpet all the possible adverse reactions/side effects.   Do not drive or operate heavy machinery when taking Pain medications.   Do not take more than prescribed Pain, Sleep and Anxiety Medications  Special Instructions: If you have smoked or chewed Tobacco  in the last 2 yrs please stop smoking, stop any regular Alcohol  and or any Recreational drug use.  Wear Seat belts while driving.  Please note You were cared for by a hospitalist during your hospital stay. If you have any questions about your discharge medications or the care you received while you were in the hospital after you are discharged, you can call the unit and asked to speak with the hospitalist on call if the hospitalist that took care of you is not available. Once  you are discharged, your primary care physician will handle any further medical issues. Please note that NO REFILLS for any discharge medications will be authorized once you are discharged, as it is imperative that you return to your primary care physician (or establish a relationship with a primary care physician if you do not have one) for your aftercare needs so that they can reassess your need for medications and monitor your  lab values.  You can reach the hospitalist office at phone [phone] or fax [phone]   If you do not have a primary care physician, you can call [phone] for a physician referral.  Activity: As tolerated with Full fall precautions use walker/cane & assistance as needed  Diet: heart healthy  Disposition Home

## 2015-07-22 LAB — CULTURE, BLOOD (ROUTINE X 2)
Culture: NO GROWTH
Culture: NO GROWTH

## 2015-07-24 ENCOUNTER — Non-Acute Institutional Stay (SKILLED_NURSING_FACILITY): Payer: Medicare Other | Admitting: Adult Health

## 2015-07-24 ENCOUNTER — Encounter: Payer: Self-pay | Admitting: Adult Health

## 2015-07-24 DIAGNOSIS — I130 Hypertensive heart and chronic kidney disease with heart failure and stage 1 through stage 4 chronic kidney disease, or unspecified chronic kidney disease: Secondary | ICD-10-CM

## 2015-07-24 DIAGNOSIS — I83029 Varicose veins of left lower extremity with ulcer of unspecified site: Secondary | ICD-10-CM | POA: Diagnosis not present

## 2015-07-24 DIAGNOSIS — E1143 Type 2 diabetes mellitus with diabetic autonomic (poly)neuropathy: Secondary | ICD-10-CM | POA: Diagnosis not present

## 2015-07-24 DIAGNOSIS — E1149 Type 2 diabetes mellitus with other diabetic neurological complication: Secondary | ICD-10-CM

## 2015-07-24 DIAGNOSIS — I5032 Chronic diastolic (congestive) heart failure: Secondary | ICD-10-CM | POA: Diagnosis not present

## 2015-07-24 DIAGNOSIS — N183 Chronic kidney disease, stage 3 unspecified: Secondary | ICD-10-CM

## 2015-07-24 DIAGNOSIS — N182 Chronic kidney disease, stage 2 (mild): Secondary | ICD-10-CM

## 2015-07-24 DIAGNOSIS — I509 Heart failure, unspecified: Secondary | ICD-10-CM

## 2015-07-24 DIAGNOSIS — J9622 Acute and chronic respiratory failure with hypercapnia: Secondary | ICD-10-CM

## 2015-07-24 DIAGNOSIS — L97929 Non-pressure chronic ulcer of unspecified part of left lower leg with unspecified severity: Secondary | ICD-10-CM

## 2015-07-24 DIAGNOSIS — L03116 Cellulitis of left lower limb: Secondary | ICD-10-CM

## 2015-07-24 DIAGNOSIS — G8929 Other chronic pain: Secondary | ICD-10-CM | POA: Diagnosis not present
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
## 2015-07-25 ENCOUNTER — Encounter: Payer: Self-pay | Admitting: Internal Medicine

## 2015-07-25 ENCOUNTER — Non-Acute Institutional Stay (SKILLED_NURSING_FACILITY): Payer: Medicare Other | Admitting: Internal Medicine

## 2015-07-25 DIAGNOSIS — N183 Chronic kidney disease, stage 3 unspecified: Secondary | ICD-10-CM

## 2015-07-25 DIAGNOSIS — I5032 Chronic diastolic (congestive) heart failure: Secondary | ICD-10-CM | POA: Diagnosis not present

## 2015-07-25 DIAGNOSIS — I10 Essential (primary) hypertension: Secondary | ICD-10-CM

## 2015-07-25 DIAGNOSIS — G4733 Obstructive sleep apnea (adult) (pediatric): Secondary | ICD-10-CM

## 2015-07-25 DIAGNOSIS — L97929 Non-pressure chronic ulcer of unspecified part of left lower leg with unspecified severity: Secondary | ICD-10-CM

## 2015-07-25 DIAGNOSIS — I83029 Varicose veins of left lower extremity with ulcer of unspecified site: Secondary | ICD-10-CM

## 2015-07-25 DIAGNOSIS — G8929 Other chronic pain: Secondary | ICD-10-CM

## 2015-07-25 DIAGNOSIS — I130 Hypertensive heart and chronic kidney disease with heart failure and stage 1 through stage 4 chronic kidney disease, or unspecified chronic kidney disease: Secondary | ICD-10-CM | POA: Diagnosis not present

## 2015-07-25 DIAGNOSIS — I509 Heart failure, unspecified: Secondary | ICD-10-CM

## 2015-07-25 DIAGNOSIS — E1149 Type 2 diabetes mellitus with other diabetic neurological complication: Secondary | ICD-10-CM

## 2015-07-25 DIAGNOSIS — L03116 Cellulitis of left lower limb: Secondary | ICD-10-CM

## 2015-07-25 DIAGNOSIS — F32A Depression, unspecified: Secondary | ICD-10-CM

## 2015-07-25 DIAGNOSIS — Z89511 Acquired absence of right leg below knee: Secondary | ICD-10-CM | POA: Diagnosis not present

## 2015-07-25 DIAGNOSIS — F329 Major depressive disorder, single episode, unspecified: Secondary | ICD-10-CM | POA: Diagnosis not present
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
## 2015-07-28 DIAGNOSIS — I83022 Varicose veins of left lower extremity with ulcer of calf: Secondary | ICD-10-CM | POA: Diagnosis not present

## 2015-07-28 LAB — BASIC METABOLIC PANEL
BUN: 27 mg/dL — AB (ref 4–21)
Creatinine: 1.5 mg/dL — AB (ref 0.5–1.1)
Glucose: 150 mg/dL
Potassium: 4.5 mmol/L (ref 3.4–5.3)
SODIUM: 144 mmol/L (ref 137–147)

## 2015-08-04 DIAGNOSIS — I83022 Varicose veins of left lower extremity with ulcer of calf: Secondary | ICD-10-CM | POA: Diagnosis not present

## 2015-08-11 DIAGNOSIS — I83022 Varicose veins of left lower extremity with ulcer of calf: Secondary | ICD-10-CM | POA: Diagnosis not present

## 2015-08-17 ENCOUNTER — Encounter (HOSPITAL_BASED_OUTPATIENT_CLINIC_OR_DEPARTMENT_OTHER): Payer: Medicare Other | Attending: Internal Medicine

## 2015-08-18 DIAGNOSIS — I83022 Varicose veins of left lower extremity with ulcer of calf: Secondary | ICD-10-CM | POA: Diagnosis not present

## 2015-08-23 DIAGNOSIS — F329 Major depressive disorder, single episode, unspecified: Secondary | ICD-10-CM | POA: Diagnosis not present

## 2015-08-25 ENCOUNTER — Non-Acute Institutional Stay (SKILLED_NURSING_FACILITY): Payer: Medicare Other | Admitting: Adult Health

## 2015-08-25 DIAGNOSIS — L97929 Non-pressure chronic ulcer of unspecified part of left lower leg with unspecified severity: Secondary | ICD-10-CM

## 2015-08-25 DIAGNOSIS — I83029 Varicose veins of left lower extremity with ulcer of unspecified site: Secondary | ICD-10-CM

## 2015-08-25 DIAGNOSIS — I83022 Varicose veins of left lower extremity with ulcer of calf: Secondary | ICD-10-CM | POA: Diagnosis not present

## 2015-08-25 DIAGNOSIS — N183 Chronic kidney disease, stage 3 unspecified: Secondary | ICD-10-CM

## 2015-08-25 DIAGNOSIS — I130 Hypertensive heart and chronic kidney disease with heart failure and stage 1 through stage 4 chronic kidney disease, or unspecified chronic kidney disease: Secondary | ICD-10-CM

## 2015-08-25 DIAGNOSIS — I509 Heart failure, unspecified: Secondary | ICD-10-CM | POA: Diagnosis not present

## 2015-08-25 DIAGNOSIS — E1149 Type 2 diabetes mellitus with other diabetic neurological complication: Secondary | ICD-10-CM

## 2015-08-25 DIAGNOSIS — G4733 Obstructive sleep apnea (adult) (pediatric): Secondary | ICD-10-CM

## 2015-08-25 DIAGNOSIS — J9622 Acute and chronic respiratory failure with hypercapnia: Secondary | ICD-10-CM | POA: Diagnosis not present

## 2015-08-25 DIAGNOSIS — E1143 Type 2 diabetes mellitus with diabetic autonomic (poly)neuropathy: Secondary | ICD-10-CM | POA: Diagnosis not present

## 2015-08-25 DIAGNOSIS — I5032 Chronic diastolic (congestive) heart failure: Secondary | ICD-10-CM | POA: Diagnosis not present

## 2015-09-01 DIAGNOSIS — I83022 Varicose veins of left lower extremity with ulcer of calf: Secondary | ICD-10-CM | POA: Diagnosis not present

## 2015-09-08 DIAGNOSIS — I83022 Varicose veins of left lower extremity with ulcer of calf: Secondary | ICD-10-CM | POA: Diagnosis not present

## 2015-09-13 DIAGNOSIS — I739 Peripheral vascular disease, unspecified: Secondary | ICD-10-CM | POA: Diagnosis not present

## 2015-09-13 DIAGNOSIS — B351 Tinea unguium: Secondary | ICD-10-CM | POA: Diagnosis not present

## 2015-09-13 DIAGNOSIS — M79672 Pain in left foot: Secondary | ICD-10-CM | POA: Diagnosis not present

## 2015-09-13 DIAGNOSIS — E119 Type 2 diabetes mellitus without complications: Secondary | ICD-10-CM | POA: Diagnosis not present

## 2015-09-15 DIAGNOSIS — I83022 Varicose veins of left lower extremity with ulcer of calf: Secondary | ICD-10-CM | POA: Diagnosis not present

## 2015-09-22 DIAGNOSIS — I83022 Varicose veins of left lower extremity with ulcer of calf: Secondary | ICD-10-CM | POA: Diagnosis not present

## 2015-09-25 ENCOUNTER — Non-Acute Institutional Stay (SKILLED_NURSING_FACILITY): Payer: Medicare Other | Admitting: Adult Health

## 2015-09-25 DIAGNOSIS — I130 Hypertensive heart and chronic kidney disease with heart failure and stage 1 through stage 4 chronic kidney disease, or unspecified chronic kidney disease: Secondary | ICD-10-CM

## 2015-09-25 DIAGNOSIS — N183 Chronic kidney disease, stage 3 unspecified: Secondary | ICD-10-CM

## 2015-09-25 DIAGNOSIS — E1121 Type 2 diabetes mellitus with diabetic nephropathy: Secondary | ICD-10-CM

## 2015-09-25 DIAGNOSIS — I5032 Chronic diastolic (congestive) heart failure: Secondary | ICD-10-CM | POA: Diagnosis not present

## 2015-09-25 DIAGNOSIS — G4733 Obstructive sleep apnea (adult) (pediatric): Secondary | ICD-10-CM | POA: Diagnosis not present

## 2015-09-25 DIAGNOSIS — J9622 Acute and chronic respiratory failure with hypercapnia: Secondary | ICD-10-CM | POA: Diagnosis not present

## 2015-09-25 DIAGNOSIS — G8929 Other chronic pain: Secondary | ICD-10-CM | POA: Diagnosis not present

## 2015-09-25 DIAGNOSIS — Z89511 Acquired absence of right leg below knee: Secondary | ICD-10-CM

## 2015-09-25 DIAGNOSIS — I509 Heart failure, unspecified: Secondary | ICD-10-CM

## 2015-09-27 DIAGNOSIS — E785 Hyperlipidemia, unspecified: Secondary | ICD-10-CM | POA: Diagnosis not present

## 2015-09-27 LAB — LIPID PANEL
CHOLESTEROL: 164 mg/dL (ref 0–200)
HDL: 43 mg/dL (ref 35–70)
LDL Cholesterol: 97 mg/dL
TRIGLYCERIDES: 118 mg/dL (ref 40–160)

## 2015-09-29 DIAGNOSIS — I83022 Varicose veins of left lower extremity with ulcer of calf: Secondary | ICD-10-CM | POA: Diagnosis not present

## 2015-10-03 ENCOUNTER — Emergency Department (HOSPITAL_COMMUNITY): Payer: Medicare Other

## 2015-10-03 ENCOUNTER — Inpatient Hospital Stay (HOSPITAL_COMMUNITY): Payer: Medicare Other

## 2015-10-03 ENCOUNTER — Inpatient Hospital Stay (HOSPITAL_COMMUNITY)
Admission: EM | Admit: 2015-10-03 | Discharge: 2015-10-10 | DRG: 871 | Disposition: A | Discharge status: Rehabilitation Facility | Payer: Medicare Other | Attending: Internal Medicine | Admitting: Internal Medicine

## 2015-10-03 ENCOUNTER — Encounter (HOSPITAL_COMMUNITY): Payer: Self-pay | Admitting: Radiology

## 2015-10-03 DIAGNOSIS — E119 Type 2 diabetes mellitus without complications: Secondary | ICD-10-CM | POA: Diagnosis not present

## 2015-10-03 DIAGNOSIS — Z9911 Dependence on respirator [ventilator] status: Secondary | ICD-10-CM | POA: Diagnosis not present

## 2015-10-03 DIAGNOSIS — N390 Urinary tract infection, site not specified: Secondary | ICD-10-CM | POA: Diagnosis present

## 2015-10-03 DIAGNOSIS — I5032 Chronic diastolic (congestive) heart failure: Secondary | ICD-10-CM | POA: Diagnosis not present

## 2015-10-03 DIAGNOSIS — Z794 Long term (current) use of insulin: Secondary | ICD-10-CM

## 2015-10-03 DIAGNOSIS — E559 Vitamin D deficiency, unspecified: Secondary | ICD-10-CM | POA: Diagnosis present

## 2015-10-03 DIAGNOSIS — N183 Chronic kidney disease, stage 3 unspecified: Secondary | ICD-10-CM | POA: Diagnosis present

## 2015-10-03 DIAGNOSIS — Z89511 Acquired absence of right leg below knee: Secondary | ICD-10-CM | POA: Diagnosis not present

## 2015-10-03 DIAGNOSIS — Z8249 Family history of ischemic heart disease and other diseases of the circulatory system: Secondary | ICD-10-CM

## 2015-10-03 DIAGNOSIS — Z6841 Body Mass Index (BMI) 40.0 and over, adult: Secondary | ICD-10-CM

## 2015-10-03 DIAGNOSIS — E1151 Type 2 diabetes mellitus with diabetic peripheral angiopathy without gangrene: Secondary | ICD-10-CM | POA: Diagnosis present

## 2015-10-03 DIAGNOSIS — J9811 Atelectasis: Secondary | ICD-10-CM | POA: Diagnosis present

## 2015-10-03 DIAGNOSIS — N179 Acute kidney failure, unspecified: Secondary | ICD-10-CM | POA: Diagnosis not present

## 2015-10-03 DIAGNOSIS — J811 Chronic pulmonary edema: Secondary | ICD-10-CM | POA: Diagnosis not present

## 2015-10-03 DIAGNOSIS — K219 Gastro-esophageal reflux disease without esophagitis: Secondary | ICD-10-CM | POA: Diagnosis present

## 2015-10-03 DIAGNOSIS — L03116 Cellulitis of left lower limb: Secondary | ICD-10-CM | POA: Diagnosis present

## 2015-10-03 DIAGNOSIS — I130 Hypertensive heart and chronic kidney disease with heart failure and stage 1 through stage 4 chronic kidney disease, or unspecified chronic kidney disease: Secondary | ICD-10-CM | POA: Diagnosis present

## 2015-10-03 DIAGNOSIS — Z79899 Other long term (current) drug therapy: Secondary | ICD-10-CM

## 2015-10-03 DIAGNOSIS — A419 Sepsis, unspecified organism: Principal | ICD-10-CM | POA: Diagnosis present

## 2015-10-03 DIAGNOSIS — L97929 Non-pressure chronic ulcer of unspecified part of left lower leg with unspecified severity: Secondary | ICD-10-CM | POA: Diagnosis present

## 2015-10-03 DIAGNOSIS — G8929 Other chronic pain: Secondary | ICD-10-CM | POA: Diagnosis present

## 2015-10-03 DIAGNOSIS — G9349 Other encephalopathy: Secondary | ICD-10-CM | POA: Diagnosis present

## 2015-10-03 DIAGNOSIS — I7389 Other specified peripheral vascular diseases: Secondary | ICD-10-CM | POA: Diagnosis not present

## 2015-10-03 DIAGNOSIS — E1122 Type 2 diabetes mellitus with diabetic chronic kidney disease: Secondary | ICD-10-CM | POA: Diagnosis present

## 2015-10-03 DIAGNOSIS — L97229 Non-pressure chronic ulcer of left calf with unspecified severity: Secondary | ICD-10-CM | POA: Diagnosis not present

## 2015-10-03 DIAGNOSIS — E11622 Type 2 diabetes mellitus with other skin ulcer: Secondary | ICD-10-CM | POA: Diagnosis present

## 2015-10-03 DIAGNOSIS — E662 Morbid (severe) obesity with alveolar hypoventilation: Secondary | ICD-10-CM | POA: Diagnosis present

## 2015-10-03 DIAGNOSIS — I83029 Varicose veins of left lower extremity with ulcer of unspecified site: Secondary | ICD-10-CM | POA: Diagnosis present

## 2015-10-03 DIAGNOSIS — E1129 Type 2 diabetes mellitus with other diabetic kidney complication: Secondary | ICD-10-CM | POA: Diagnosis present

## 2015-10-03 DIAGNOSIS — Z888 Allergy status to other drugs, medicaments and biological substances status: Secondary | ICD-10-CM | POA: Diagnosis not present

## 2015-10-03 DIAGNOSIS — J9621 Acute and chronic respiratory failure with hypoxia: Secondary | ICD-10-CM | POA: Diagnosis present

## 2015-10-03 DIAGNOSIS — R0602 Shortness of breath: Secondary | ICD-10-CM | POA: Diagnosis not present

## 2015-10-03 DIAGNOSIS — F329 Major depressive disorder, single episode, unspecified: Secondary | ICD-10-CM | POA: Diagnosis not present

## 2015-10-03 DIAGNOSIS — I87312 Chronic venous hypertension (idiopathic) with ulcer of left lower extremity: Secondary | ICD-10-CM | POA: Diagnosis not present

## 2015-10-03 DIAGNOSIS — J9620 Acute and chronic respiratory failure, unspecified whether with hypoxia or hypercapnia: Secondary | ICD-10-CM | POA: Diagnosis not present

## 2015-10-03 DIAGNOSIS — R51 Headache: Secondary | ICD-10-CM | POA: Diagnosis not present

## 2015-10-03 DIAGNOSIS — I5033 Acute on chronic diastolic (congestive) heart failure: Secondary | ICD-10-CM | POA: Diagnosis present

## 2015-10-03 DIAGNOSIS — R509 Fever, unspecified: Secondary | ICD-10-CM | POA: Diagnosis not present

## 2015-10-03 DIAGNOSIS — I5020 Unspecified systolic (congestive) heart failure: Secondary | ICD-10-CM | POA: Diagnosis not present

## 2015-10-03 DIAGNOSIS — G9340 Encephalopathy, unspecified: Secondary | ICD-10-CM | POA: Diagnosis not present

## 2015-10-03 DIAGNOSIS — J9622 Acute and chronic respiratory failure with hypercapnia: Secondary | ICD-10-CM | POA: Diagnosis not present

## 2015-10-03 DIAGNOSIS — Z7982 Long term (current) use of aspirin: Secondary | ICD-10-CM

## 2015-10-03 DIAGNOSIS — Z9119 Patient's noncompliance with other medical treatment and regimen: Secondary | ICD-10-CM

## 2015-10-03 DIAGNOSIS — I872 Venous insufficiency (chronic) (peripheral): Secondary | ICD-10-CM | POA: Diagnosis present

## 2015-10-03 DIAGNOSIS — E1121 Type 2 diabetes mellitus with diabetic nephropathy: Secondary | ICD-10-CM | POA: Diagnosis not present

## 2015-10-03 LAB — COMPREHENSIVE METABOLIC PANEL
ALBUMIN: 2.3 g/dL — AB (ref 3.5–5.0)
ALT: 18 U/L (ref 14–54)
ALT: 17 U/L (ref 14–54)
ANION GAP: 9 (ref 5–15)
ANION GAP: 6 (ref 5–15)
AST: 22 U/L (ref 15–41)
AST: 23 U/L (ref 15–41)
Albumin: 2.7 g/dL — ABNORMAL LOW (ref 3.5–5.0)
Alkaline Phosphatase: 97 U/L (ref 38–126)
Alkaline Phosphatase: 80 U/L (ref 38–126)
BILIRUBIN TOTAL: 0.8 mg/dL (ref 0.3–1.2)
BUN: 27 mg/dL — AB (ref 6–20)
BUN: 26 mg/dL — AB (ref 6–20)
CALCIUM: 9 mg/dL (ref 8.9–10.3)
CHLORIDE: 103 mmol/L (ref 101–111)
CO2: 36 mmol/L — ABNORMAL HIGH (ref 22–32)
CO2: 34 mmol/L — ABNORMAL HIGH (ref 22–32)
CREATININE: 1.69 mg/dL — AB (ref 0.44–1.00)
Calcium: 8.3 mg/dL — ABNORMAL LOW (ref 8.9–10.3)
Chloride: 98 mmol/L — ABNORMAL LOW (ref 101–111)
Creatinine, Ser: 1.77 mg/dL — ABNORMAL HIGH (ref 0.44–1.00)
GFR calc Af Amer: 34 mL/min — ABNORMAL LOW (ref >60)
GFR calc Af Amer: 32 mL/min — ABNORMAL LOW (ref >60)
GFR, EST NON AFRICAN AMERICAN: 30 mL/min — AB (ref >60)
GFR, EST NON AFRICAN AMERICAN: 28 mL/min — AB (ref >60)
Glucose, Bld: 199 mg/dL — ABNORMAL HIGH (ref 65–99)
Glucose, Bld: 208 mg/dL — ABNORMAL HIGH (ref 65–99)
POTASSIUM: 4.3 mmol/L (ref 3.5–5.1)
POTASSIUM: 4.3 mmol/L (ref 3.5–5.1)
Sodium: 143 mmol/L (ref 135–145)
Sodium: 143 mmol/L (ref 135–145)
TOTAL PROTEIN: 6.6 g/dL (ref 6.5–8.1)
Total Bilirubin: 1 mg/dL (ref 0.3–1.2)
Total Protein: 7.7 g/dL (ref 6.5–8.1)

## 2015-10-03 LAB — URINALYSIS, ROUTINE W REFLEX MICROSCOPIC
Bilirubin Urine: NEGATIVE
GLUCOSE, UA: NEGATIVE mg/dL
Hgb urine dipstick: NEGATIVE
Ketones, ur: NEGATIVE mg/dL
NITRITE: NEGATIVE
PH: 6.5 (ref 5.0–8.0)
Protein, ur: NEGATIVE mg/dL
Specific Gravity, Urine: 1.013 (ref 1.005–1.030)

## 2015-10-03 LAB — TSH: TSH: 1.158 u[IU]/mL (ref 0.350–4.500)

## 2015-10-03 LAB — MRSA PCR SCREENING: MRSA BY PCR: POSITIVE — AB

## 2015-10-03 LAB — CBC WITH DIFFERENTIAL/PLATELET
BASOS ABS: 0 10*3/uL (ref 0.0–0.1)
BASOS PCT: 0 %
Basophils Absolute: 0 10*3/uL (ref 0.0–0.1)
Basophils Relative: 0 %
EOS ABS: 0 10*3/uL (ref 0.0–0.7)
EOS PCT: 0 %
Eosinophils Absolute: 0 10*3/uL (ref 0.0–0.7)
Eosinophils Relative: 0 %
HCT: 43.1 % (ref 36.0–46.0)
HEMATOCRIT: 33.9 % — AB (ref 36.0–46.0)
HEMOGLOBIN: 12.8 g/dL (ref 12.0–15.0)
HEMOGLOBIN: 10.3 g/dL — AB (ref 12.0–15.0)
LYMPHS ABS: 0.6 10*3/uL — AB (ref 0.7–4.0)
LYMPHS ABS: 0.6 10*3/uL — AB (ref 0.7–4.0)
LYMPHS PCT: 3 %
Lymphocytes Relative: 4 %
MCH: 27.5 pg (ref 26.0–34.0)
MCH: 28 pg (ref 26.0–34.0)
MCHC: 29.7 g/dL — ABNORMAL LOW (ref 30.0–36.0)
MCHC: 30.4 g/dL (ref 30.0–36.0)
MCV: 92.7 fL (ref 78.0–100.0)
MCV: 92.1 fL (ref 78.0–100.0)
MONO ABS: 1 10*3/uL (ref 0.1–1.0)
MONOS PCT: 6 %
Monocytes Absolute: 0.6 10*3/uL (ref 0.1–1.0)
Monocytes Relative: 3 %
NEUTROS ABS: 17.4 10*3/uL — AB (ref 1.7–7.7)
NEUTROS PCT: 90 %
NEUTROS PCT: 94 %
Neutro Abs: 13.4 10*3/uL — ABNORMAL HIGH (ref 1.7–7.7)
PLATELETS: 188 10*3/uL (ref 150–400)
Platelets: 188 10*3/uL (ref 150–400)
RBC: 4.65 MIL/uL (ref 3.87–5.11)
RBC: 3.68 MIL/uL — AB (ref 3.87–5.11)
RDW: 15.9 % — ABNORMAL HIGH (ref 11.5–15.5)
RDW: 15.9 % — ABNORMAL HIGH (ref 11.5–15.5)
WBC: 15.1 10*3/uL — ABNORMAL HIGH (ref 4.0–10.5)
WBC: 18.6 10*3/uL — AB (ref 4.0–10.5)

## 2015-10-03 LAB — TROPONIN I
TROPONIN I: 0.05 ng/mL — AB (ref <0.031)
Troponin I: 0.03 ng/mL (ref <0.031)
Troponin I: 0.03 ng/mL (ref <0.031)

## 2015-10-03 LAB — INFLUENZA PANEL BY PCR (TYPE A & B)
H1N1 flu by pcr: NOT DETECTED
INFLAPCR: NEGATIVE
Influenza B By PCR: NEGATIVE

## 2015-10-03 LAB — BLOOD GAS, ARTERIAL
Acid-Base Excess: 6.5 mmol/L — ABNORMAL HIGH (ref 0.0–2.0)
BICARBONATE: 33.4 meq/L — AB (ref 20.0–24.0)
Drawn by: 252031
O2 CONTENT: 6 L/min
O2 SAT: 95.5 %
PO2 ART: 88 mmHg (ref 80.0–100.0)
Patient temperature: 98.6
TCO2: 35.8 mmol/L (ref 0–100)
pCO2 arterial: 78.7 mmHg (ref 35.0–45.0)
pH, Arterial: 7.251 — ABNORMAL LOW (ref 7.350–7.450)

## 2015-10-03 LAB — URINE MICROSCOPIC-ADD ON

## 2015-10-03 LAB — PROCALCITONIN
PROCALCITONIN: 4.4 ng/mL
Procalcitonin: 5.2 ng/mL

## 2015-10-03 LAB — GLUCOSE, CAPILLARY
GLUCOSE-CAPILLARY: 182 mg/dL — AB (ref 65–99)
GLUCOSE-CAPILLARY: 185 mg/dL — AB (ref 65–99)
GLUCOSE-CAPILLARY: 185 mg/dL — AB (ref 65–99)
GLUCOSE-CAPILLARY: 182 mg/dL — AB (ref 65–99)
Glucose-Capillary: 170 mg/dL — ABNORMAL HIGH (ref 65–99)
Glucose-Capillary: 183 mg/dL — ABNORMAL HIGH (ref 65–99)

## 2015-10-03 LAB — I-STAT CG4 LACTIC ACID, ED
LACTIC ACID, VENOUS: 1.4 mmol/L (ref 0.5–2.0)
Lactic Acid, Venous: 1.97 mmol/L (ref 0.5–2.0)

## 2015-10-03 LAB — APTT: APTT: 32 s (ref 24–37)

## 2015-10-03 LAB — LACTIC ACID, PLASMA
Lactic Acid, Venous: 1.1 mmol/L (ref 0.5–2.0)
Lactic Acid, Venous: 1.2 mmol/L (ref 0.5–2.0)

## 2015-10-03 LAB — PROTIME-INR
INR: 1.2 (ref 0.00–1.49)
PROTHROMBIN TIME: 15.4 s — AB (ref 11.6–15.2)

## 2015-10-03 MED ORDER — ONDANSETRON HCL 4 MG PO TABS: 4.0000 mg | ORAL_TABLET | Freq: Four times a day (QID) | ORAL | Status: DC | PRN

## 2015-10-03 MED ORDER — ACETAMINOPHEN 325 MG PO TABS
650.0000 mg | ORAL_TABLET | Freq: Once | ORAL | Status: AC
  Administered 2015-10-03: 650 mg via ORAL
  Filled 2015-10-03: qty 2

## 2015-10-03 MED ORDER — INSULIN ASPART 100 UNIT/ML ~~LOC~~ SOLN: 0.0000 [IU] | SUBCUTANEOUS | Status: DC

## 2015-10-03 MED ORDER — IPRATROPIUM-ALBUTEROL 0.5-2.5 (3) MG/3ML IN SOLN
3.0000 mL | RESPIRATORY_TRACT | Status: DC
  Administered 2015-10-03 – 2015-10-04 (×6): 3 mL via RESPIRATORY_TRACT
  Filled 2015-10-03 (×7): qty 3

## 2015-10-03 MED ORDER — SODIUM CHLORIDE 0.9 % IV BOLUS (SEPSIS)
1000.0000 mL | INTRAVENOUS | Status: AC
  Administered 2015-10-03 (×4): 1000 mL via INTRAVENOUS

## 2015-10-03 MED ORDER — ACETAMINOPHEN 650 MG RE SUPP: 650.0000 mg | Freq: Four times a day (QID) | RECTAL | Status: DC | PRN

## 2015-10-03 MED ORDER — CHLORHEXIDINE GLUCONATE CLOTH 2 % EX PADS
6.0000 | MEDICATED_PAD | Freq: Every day | CUTANEOUS | Status: AC
  Administered 2015-10-03 – 2015-10-07 (×5): 6 via TOPICAL

## 2015-10-03 MED ORDER — PIPERACILLIN-TAZOBACTAM 3.375 G IVPB
3.3750 g | Freq: Three times a day (TID) | INTRAVENOUS | Status: DC
  Administered 2015-10-03 – 2015-10-05 (×7): 3.375 g via INTRAVENOUS
  Filled 2015-10-03 (×11): qty 50

## 2015-10-03 MED ORDER — FLUTICASONE PROPIONATE 50 MCG/ACT NA SUSP
2.0000 | Freq: Every day | NASAL | Status: DC
  Filled 2015-10-03: qty 16

## 2015-10-03 MED ORDER — SODIUM CHLORIDE 0.9 % IJ SOLN
3.0000 mL | Freq: Two times a day (BID) | INTRAMUSCULAR | Status: DC
  Administered 2015-10-03 – 2015-10-10 (×13): 3 mL via INTRAVENOUS

## 2015-10-03 MED ORDER — VANCOMYCIN HCL 10 G IV SOLR
1750.0000 mg | INTRAVENOUS | Status: DC
  Administered 2015-10-04 – 2015-10-06 (×3): 1750 mg via INTRAVENOUS
  Filled 2015-10-03 (×5): qty 1750

## 2015-10-03 MED ORDER — ACETAMINOPHEN 325 MG PO TABS
650.0000 mg | ORAL_TABLET | Freq: Four times a day (QID) | ORAL | Status: DC | PRN
Start: 2015-10-03 — End: 2015-10-03

## 2015-10-03 MED ORDER — FUROSEMIDE 10 MG/ML IJ SOLN
40.0000 mg | Freq: Three times a day (TID) | INTRAMUSCULAR | Status: DC
  Administered 2015-10-03 – 2015-10-04 (×4): 40 mg via INTRAVENOUS
  Filled 2015-10-03 (×5): qty 4

## 2015-10-03 MED ORDER — VANCOMYCIN HCL IN DEXTROSE 1-5 GM/200ML-% IV SOLN: 1000.0000 mg | Freq: Once | INTRAVENOUS | Status: DC

## 2015-10-03 MED ORDER — MUPIROCIN 2 % EX OINT
1.0000 "application " | TOPICAL_OINTMENT | Freq: Two times a day (BID) | CUTANEOUS | Status: AC
  Administered 2015-10-03 – 2015-10-07 (×9): 1 via NASAL
  Filled 2015-10-03 (×2): qty 22

## 2015-10-03 MED ORDER — METHYLPREDNISOLONE SODIUM SUCC 125 MG IJ SOLR
80.0000 mg | Freq: Three times a day (TID) | INTRAMUSCULAR | Status: DC
  Administered 2015-10-03 – 2015-10-04 (×4): 80 mg via INTRAVENOUS
  Filled 2015-10-03 (×2): qty 2
  Filled 2015-10-03 (×3): qty 1.28
  Filled 2015-10-03 (×2): qty 2

## 2015-10-03 MED ORDER — INSULIN GLARGINE 100 UNIT/ML ~~LOC~~ SOLN
10.0000 [IU] | Freq: Every day | SUBCUTANEOUS | Status: DC
  Administered 2015-10-03 – 2015-10-04 (×2): 10 [IU] via SUBCUTANEOUS
  Filled 2015-10-03 (×5): qty 0.1

## 2015-10-03 MED ORDER — PIPERACILLIN-TAZOBACTAM 3.375 G IVPB 30 MIN: 3.3750 g | Freq: Once | INTRAVENOUS | Status: DC

## 2015-10-03 MED ORDER — IBUPROFEN 800 MG PO TABS
800.0000 mg | ORAL_TABLET | Freq: Once | ORAL | Status: AC
  Administered 2015-10-03: 800 mg via ORAL
  Filled 2015-10-03: qty 1

## 2015-10-03 MED ORDER — FUROSEMIDE 10 MG/ML IJ SOLN: 80.0000 mg | Freq: Three times a day (TID) | INTRAMUSCULAR | Status: DC

## 2015-10-03 MED ORDER — VANCOMYCIN HCL 10 G IV SOLR
2500.0000 mg | INTRAVENOUS | Status: AC
  Administered 2015-10-03: 2500 mg via INTRAVENOUS
  Filled 2015-10-03: qty 2500

## 2015-10-03 MED ORDER — DEXTROSE 5 % IV SOLN
800.0000 mg | Freq: Two times a day (BID) | INTRAVENOUS | Status: DC
  Administered 2015-10-03 – 2015-10-04 (×3): 800 mg via INTRAVENOUS
  Filled 2015-10-03 (×5): qty 16

## 2015-10-03 MED ORDER — INSULIN ASPART 100 UNIT/ML ~~LOC~~ SOLN
0.0000 [IU] | SUBCUTANEOUS | Status: DC
  Administered 2015-10-03: 3 [IU] via SUBCUTANEOUS
  Administered 2015-10-03 (×3): 2 [IU] via SUBCUTANEOUS
  Administered 2015-10-04: 3 [IU] via SUBCUTANEOUS
  Administered 2015-10-04: 2 [IU] via SUBCUTANEOUS
  Administered 2015-10-04 (×2): 5 [IU] via SUBCUTANEOUS
  Administered 2015-10-04: 2 [IU] via SUBCUTANEOUS
  Administered 2015-10-05: 7 [IU] via SUBCUTANEOUS
  Administered 2015-10-05 (×2): 3 [IU] via SUBCUTANEOUS
  Administered 2015-10-05: 9 [IU] via SUBCUTANEOUS
  Administered 2015-10-05: 3 [IU] via SUBCUTANEOUS

## 2015-10-03 MED ORDER — PIPERACILLIN-TAZOBACTAM 3.375 G IVPB 30 MIN
3.3750 g | INTRAVENOUS | Status: AC
  Administered 2015-10-03: 3.375 g via INTRAVENOUS
  Filled 2015-10-03: qty 50

## 2015-10-03 MED ORDER — ONDANSETRON HCL 4 MG/2ML IJ SOLN: 4.0000 mg | Freq: Four times a day (QID) | INTRAMUSCULAR | Status: DC | PRN

## 2015-10-03 MED ORDER — SODIUM CHLORIDE 0.9 % IV BOLUS (SEPSIS)
500.0000 mL | INTRAVENOUS | Status: AC
  Administered 2015-10-03: 500 mL via INTRAVENOUS

## 2015-10-03 MED ORDER — CETYLPYRIDINIUM CHLORIDE 0.05 % MT LIQD
7.0000 mL | Freq: Two times a day (BID) | OROMUCOSAL | Status: DC
  Administered 2015-10-03 – 2015-10-10 (×15): 7 mL via OROMUCOSAL

## 2015-10-03 NOTE — Progress Notes (Signed)
ANTIBIOTIC CONSULT NOTE - INITIAL  Pharmacy Consult for Vancomycin and Zosyn Indication: sepsis  Allergies  Allergen Reactions  . Ace Inhibitors     unknown    Patient Measurements: Weight: (!) 350 lb (158.759 kg) Ht: 57 in    Vital Signs: Temp: 102.8 F (39.3 C) (12/20 0149) Temp Source: Rectal (12/20 0149) BP: 118/76 mmHg (12/20 0304) Pulse Rate: 108 (12/20 0304) Intake/Output from previous day:   Intake/Output from this shift:    Labs:  Recent Labs  10/03/15 0149  WBC 15.1*  HGB 12.8  PLT 188  CREATININE 1.69*   Estimated Creatinine Clearance: 42.4 mL/min (by C-G formula based on Cr of 1.69). No results for input(s): VANCOTROUGH, VANCOPEAK, VANCORANDOM, GENTTROUGH, GENTPEAK, GENTRANDOM, TOBRATROUGH, TOBRAPEAK, TOBRARND, AMIKACINPEAK, AMIKACINTROU, AMIKACIN in the last 72 hours.   Microbiology: No results found for this or any previous visit (from the past 720 hour(s)).  Medical History: Past Medical History  Diagnosis Date  . 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 Essential hypertension, benign 12/31/2012  . Chronic diastolic heart failure ([place]) 12/31/2012  . GERD 12/31/2012  . Unspecified vitamin D deficiency 12/31/2012  . Chronic pain 12/31/2012  . Venous stasis ulcers ([place])   . Venous insufficiency (chronic) (peripheral)   . Diabetes mellitus without complication ([place])     TYPE 2  . Shortness of breath dyspnea     Medications:  See electronic med rec  Assessment: 69 y.o. F presents from [facility] with headache and fever. Code sepsis called. Vancomycin and Zosyn to begin for sepsis. Tm 104.8. Tc 102.8. Noted pt with chronic L lower leg cellulitis. WBC elevated to 15.1. SCr 1.69, est norm CrCl 35 ml/min  Goal of Therapy:  Vancomycin trough level 15-20 mcg/ml  Plan:  Zosyn 3.375gm IV now over 30 min then 3.375gm IV q8h - subsequent doses over 4  hours Vancomycin 2500mg  IV now then 1750mg  IV q24h Will f/u micro data, renal function, and pt's clinical condition Vanc trough prn  Sherlon Handing, PharmD, BCPS Clinical pharmacist, pager [phone] 10/03/2015,3:13 AM

## 2015-10-03 NOTE — Progress Notes (Signed)
Paged MD regarding critical ABG results. CCM consulted. Will continue to monitor.

## 2015-10-03 NOTE — Progress Notes (Signed)
Paged Dr. Hal Hope of [facility] regarding pt's decreased LOC, increased work of breathing, and overall poor appearance. STAT ABG and Bi-Pap ordered.

## 2015-10-03 NOTE — Progress Notes (Signed)
eLink Physician-Brief Progress Note Patient Name: Heather Zimmerman. Gericke DOB: 1946-03-22 MRN: [id number]   Date of Service  10/03/2015  HPI/Events of Note  Per APP at bedide - bp sfoft but could be baseline. Alert but confused   eICU Interventions  Restart bippa Hold off sdu trx out     Intervention Category Evaluation Type: Other  Leston Schueller 10/03/2015, 8:32 PM

## 2015-10-03 NOTE — Progress Notes (Signed)
Placed patient on BiPAP for the night. She is tolerating it well and RT will continue to monitor.  

## 2015-10-03 NOTE — Progress Notes (Signed)
66fr foley placed per MD order due to unstable critical patient. Pt tolerated procedure well Drema Dallas RN present as second person during insertion. Peri care care with soap and water provided prior to insertion and sterile procedure maintained throughout. Will continue to monitor.

## 2015-10-03 NOTE — Progress Notes (Signed)
ANTIINFECTIVE CONSULT NOTE - INITIAL  Pharmacy Consult for acyclovir Indication: r/o meningitis  Allergies  Allergen Reactions  . Ace Inhibitors     unknown    Patient Measurements: Height: 4\' 10"  (147.3 cm) Weight: (!) 327 lb (148.326 kg) IBW/kg (Calculated) : 40.9 Adjusted Body Weight: 80kg  Labs:  Recent Labs  10/03/15 0149  WBC 15.1*  HGB 12.8  PLT 188  CREATININE 1.69*   Estimated Creatinine Clearance: 41 mL/min (by C-G formula based on Cr of 1.69).    Assessment/Plan:  69yo female presents w/ fever and HA, started on ABX for possible sepsis, now also w/ concern for meningitis, to broaden w/ antiviral coverage.  Will start acyclovir 800mg  IV Q12H and monitor CBC and Cx.  Wynona Neat, PharmD, BCPS  10/03/2015,7:51 AM

## 2015-10-03 NOTE — Progress Notes (Signed)
RT transported pt on BIPAP with 3S RN and NT to 53M without complications.

## 2015-10-03 NOTE — Progress Notes (Signed)
Attempts made by two RT's to obtain ABG without success. RN aware. RT will continue to monitor.

## 2015-10-03 NOTE — Progress Notes (Signed)
Spoke to Dr Chase Caller about scheduled IV 80mg  of Lasix that is due. Pts BP MAP is 63-65. DR Chase Caller stated to still give Lasix but to change dose to 40mg  IV TID. Order changed. Will continue to monitor.
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
## 2015-10-03 NOTE — Progress Notes (Signed)
Pt tolerated to 2M11 per MD order. Report called to receiving nurse and all questions answered. Pt transferred on Bipap with RT to assist. Prior to transfer two RT's attempted twice each to obtain repeate ABG with no success. MD made aware.

## 2015-10-03 NOTE — Consult Note (Signed)
WOC wound consult note Reason for Consult: Consult requested for left leg wounds.  Pt is familiar to [facility] team from previous admission. Wound type: Left leg with chronic full thickness venous stasis ulcers. Measurement: Upper calf 2X4.2X.1cm, middle leg 6X5X.2cm, posterior leg 2X3X.1cm.  Wounds are red and moist, small  amt yellow drainage, no odor, surrounded by pink dry scar tissue. LLE with generalized edema. Dressing procedure/placement/frequency: Foam dressing to absorb drainage and promote healing.   Please re-consult if further assistance is needed.  Thank-you,  Julien Girt MSN, [place], [place], [place], [place]
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
## 2015-10-03 NOTE — Consult Note (Signed)
PULMONARY / CRITICAL CARE MEDICINE   Name: Heather Zimmerman. Stetz MRN: [id number] DOB: Apr 24, 1946    ADMISSION DATE:  10/03/2015 CONSULTATION DATE: 12/20  REFERRING MD: Triad  CHIEF COMPLAINT:  AMS/Fever  HISTORY OF PRESENT ILLNESS:   69 yo MO(327 lbs) with a plethora of health issues who presents with 24 hours of fever to 104, decreased LOC, with known lower ext ulcer with extensive tissue excoriation. Given 4 litre of IVF and V/Z in ED and moved to SDU. She became more lethargic and pco2 was 76 with PH 7.26. Placed on Bipap and PCCM consulted at [phone] and PCCM to bedside at 0725. She will be moved to ICU, placed on PCCM service. She may need intubation and cvl vs EOL discussion.  PAST MEDICAL HISTORY :  She  has a past medical history of Type II or unspecified type diabetes mellitus with peripheral circulatory disorders, not stated as uncontrolled(250.70) (12/31/2012); Essential hypertension, benign (12/31/2012); Chronic diastolic heart failure ([place]) (12/31/2012); GERD (12/31/2012); Unspecified vitamin D deficiency (12/31/2012); Chronic pain (12/31/2012); Venous stasis ulcers ([place]); Venous insufficiency (chronic) (peripheral); Diabetes mellitus without complication ([place]); and Shortness of breath dyspnea.  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  Allergies  Allergen Reactions  . Ace Inhibitors     unknown    No current facility-administered medications on file prior to encounter.   Current Outpatient Prescriptions on File Prior to Encounter  Medication Sig  . aspirin 81 MG tablet Take 81 mg by mouth daily.  . camphor-menthol (SARNA) lotion Apply topically as needed for itching.  . cetirizine-pseudoephedrine (ZYRTEC-D) 5-120 MG per tablet Take 1 tablet by mouth 2 (two) times daily.  . cholecalciferol (VITAMIN D) 1000 UNITS tablet Take 2,000 Units by mouth daily.  . DULoxetine (CYMBALTA) 20 MG  capsule Take 40 mg by mouth daily.   . Fluticasone Propionate (FLONASE NA) Place 1 spray into the nose daily as needed. For nasal congestion  . Fluticasone-Salmeterol (ADVAIR) 250-50 MCG/DOSE AEPB Inhale 1 puff into the lungs 2 (two) times daily.  Marland Kitchen gabapentin (NEURONTIN) 600 MG tablet Take 600 mg by mouth 2 (two) times daily.  Marland Kitchen HYDROcodone-acetaminophen (NORCO/VICODIN) 5-325 MG tablet Take 1 tablet by mouth every 4 (four) hours as needed for moderate pain or severe pain.  Marland Kitchen insulin aspart (NOVOLOG) 100 UNIT/ML injection Inject 5 Units into the skin 3 (three) times daily before meals. With an additional 5 units for cg >=150 (Patient taking differently: Inject 5 Units into the skin 3 (three) times daily before meals. cbg >=150)  . Insulin Glargine (LANTUS SOLOSTAR) 100 UNIT/ML Solostar Pen Inject 20 Units into the skin at bedtime.   Marland Kitchen ipratropium-albuterol (DUONEB) 0.5-2.5 (3) MG/3ML SOLN Take 3 mLs by nebulization every 6 (six) hours as needed (shortness of breath).  . magnesium oxide (MAG-OX) 400 MG tablet Take 400 mg by mouth every 8 (eight) hours.   Marland Kitchen omeprazole (PRILOSEC) 20 MG capsule Take 20 mg by mouth daily.  Marland Kitchen torsemide (DEMADEX) 20 MG tablet Take 1 tablet (20 mg total) by mouth 2 (two) times daily.  Marland Kitchen acetaminophen (TYLENOL) 160 MG/5ML solution Place 20.3 mLs (650 mg total) into feeding tube every 6 (six) hours as needed for fever. (Patient not taking: Reported on 10/03/2015)  . doxycycline (VIBRAMYCIN) 100 MG capsule Take 1 capsule (100 mg total) by mouth 2 (two) times daily. For 10 additional days. End date 07/31/2015 (Patient not taking: Reported on 10/03/2015)  FAMILY HISTORY:  Her has no family status information on file.   SOCIAL HISTORY: She  reports that she has never smoked. She has never used smokeless tobacco. She reports that she does not drink alcohol or use illicit drugs.  REVIEW OF SYSTEMS:   na  SUBJECTIVE:    VITAL SIGNS: BP 97/57 mmHg  Pulse 86  Temp(Src)  97.7 F (36.5 C) (Axillary)  Resp 18  Ht 4\' 10"  (1.473 m)  Wt 327 lb (148.326 kg)  BMI 68.36 kg/m2  SpO2 100%  HEMODYNAMICS:    VENTILATOR SETTINGS:    INTAKE / OUTPUT: I/O last 3 completed shifts: In: 5055 [I.V.:5050; IV Piggyback:5] Out: 180 [Urine:180]  PHYSICAL EXAMINATION: General: MO AAF, barely arousable, rt bka noted Neuro:  Opens eyes to strong stimulus HEENT: Bipap mask in place , no neck Cardiovascular:  HSD SR Lungs: decrease air movent despite NIMVS Abdomen: obese , decreased bs Musculoskeletal: rt bka, left lower ext with extensive ulcers and skin sloughing Skin:  Left lower ext ulcer and excoriation  LABS:  BMET  Recent Labs Lab 10/03/15 0149  NA 143  K 4.3  CL 98*  CO2 36*  BUN 27*  CREATININE 1.69*  GLUCOSE 199*    Electrolytes  Recent Labs Lab 10/03/15 0149  CALCIUM 9.0    CBC  Recent Labs Lab 10/03/15 0149  WBC 15.1*  HGB 12.8  HCT 43.1  PLT 188    Coag's No results for input(s): APTT, INR in the last 168 hours.  Sepsis Markers  Recent Labs Lab 10/03/15 0243 10/03/15 0407  LATICACIDVEN 1.97 1.40    ABG  Recent Labs Lab 10/03/15 0619  PHART 7.251*  PCO2ART 78.7*  PO2ART 88.0    Liver Enzymes  Recent Labs Lab 10/03/15 0149  AST 22  ALT 18  ALKPHOS 97  BILITOT 0.8  ALBUMIN 2.7*    Cardiac Enzymes No results for input(s): TROPONINI, PROBNP in the last 168 hours.  Glucose No results for input(s): GLUCAP in the last 168 hours.  Imaging Ct Head Wo Contrast  10/03/2015  CLINICAL DATA:  Fever and headache. Evaluate for intracranial hemorrhage. EXAM: CT HEAD WITHOUT CONTRAST TECHNIQUE: Contiguous axial images were obtained from the base of the skull through the vertex without intravenous contrast. COMPARISON:  None. FINDINGS: Motion degraded study which could obscure pathology. Skull and Sinuses:No evidence of acute fracture or destructive process. Right maxillary sinus opacification and atelectasis  compatible with chronic obstruction. Visualized orbits: Remote blowout fracture of the medial wall on the right. No acute finding. Brain: No evidence of acute infarction, hemorrhage, hydrocephalus, or mass lesion/mass effect. IMPRESSION: 1. No acute finding. 2. Moderate limitation due to motion. 3. Chronic right maxillary sinus obstruction. Electronically Signed   By: Monte Fantasia M.D.   On: 10/03/2015 03:27   Dg Chest Port 1 View  10/03/2015  CLINICAL DATA:  Patient with respiratory failure, ventilator dependent. EXAM: PORTABLE CHEST 1 VIEW COMPARISON:  Chest radiograph 10/03/2015. FINDINGS: Monitoring leads overlie the patient. Stable enlarged cardiac and mediastinal contours. Low lung volumes. Unchanged pulmonary vascular redistribution and interstitial pulmonary edema. No large area of pulmonary consolidation or pleural effusion. IMPRESSION: Cardiomegaly with mild interstitial pulmonary edema, grossly unchanged from recent prior exam. Electronically Signed   By: Lovey Newcomer M.D.   On: 10/03/2015 07:15   Dg Chest Port 1 View  10/03/2015  CLINICAL DATA:  68-year-old female with sepsis and fever. EXAM: PORTABLE CHEST 1 VIEW COMPARISON:  Radiograph dated 07/18/2015 FINDINGS: Single portable view  of the chest demonstrate cardiomegaly with central vascular prominence compatible with congestive changes. There is no focal consolidation, pleural effusion, or pneumothorax. There is degenerative changes of the shoulders. No acute fracture. IMPRESSION: Cardiomegaly with congestive changes.  No focal consolidation. Electronically Signed   By: Anner Crete M.D.   On: Oct 18, 2015 02:24     STUDIES:    CULTURES: 10/18/23 UC>> Oct 18, 2023 bc>> 10-18-2023 procal 2.02, lactic acid 1.59  ANTIBIOTICS: 18-Oct-2023 vanc>> 12/27 zoysn>>  SIGNIFICANT EVENTS:   LINES/TUBES:   DISCUSSION: 69 yo MO(327 lbs) with a plethora of health issues who presents with 24 hours of fever to 104, decreased LOC, with known lower ext ulcer  with extensive tissue excoriation. Given 4 litre of IVF and V/Z in ED and moved to SDU. She became more lethargic and pco2 was 76 with PH 7.26. Placed on Bipap and PCCM consulted at [phone] and PCCM to bedside at 0725. She will be moved to ICU, placed on PCCM service. She may need intubation and cvl vs EOL discussion.  ASSESSMENT / PLAN:  PULMONARY A: Hypercarbic resp failure OSA P:   NIMVS May need intubation Check ABG on NIMVS to guide possible intubation. Her lethargy may preclude NIMVS if she cant protect her airway.   CARDIOVASCULAR A:  Hypotension from presumed infectious process, lactic acid 1.4, procal 2.02, post 4 litres of fluid. CHF on chronic diuresis  P:  Hold diuresis Gentle hydration. Very fine line between resuscitation and overload Move to ICU Clarify code status  May need CVL and pressors  RENAL Lab Results  Component Value Date   CREATININE 1.69* 10/18/2015   CREATININE 1.60* 07/21/2015   CREATININE 1.50* 07/20/2015   CREATININE 1.40* 11/05/2014   CREATININE 1.30* 10/22/2014   CREATININE 1.3* 08/01/2014    A:   CRI P:   Avoid nephrotoxins  GASTROINTESTINAL A:   GI protection P:   PPI  HEMATOLOGIC A:   Jehovah witness  P:  No acute issue  INFECTIOUS A:   Presumed left lower ext ulcer infection P:   Abx for HCAI due to being in NH  ENDOCRINE A:   DM  P:   SSI  NEUROLOGIC A:   Lethargic but responds to strong stimuli. Component of TME with hypercarbia and sepsis P:   RASS goal: 0 Hold all sedatives Ventilate to lower PCO2 Treat underlying infections   FAMILY  - Updates:   - Inter-disciplinary family meet or Palliative Care meeting due by:  day 7  CCT 40 min [name] Minor ACNP Maryanna Shape PCCM Pager [phone] till 3 pm If no answer page [phone] 10-18-15, 7:52 AM

## 2015-10-03 NOTE — Progress Notes (Signed)
eLink Physician-Brief Progress Note Patient Name: Heather Zimmerman DOB: Mar 23, 1946 MRN: [id number]   Date of Service  10/03/2015  HPI/Events of Note  mikld headache Looks really well on camera  eICU Interventions  Tylenol x 1     Intervention Category Intermediate Interventions: Pain - evaluation and management  Jasdeep Dejarnett 10/03/2015, 5:48 PM

## 2015-10-03 NOTE — Progress Notes (Signed)
Utilization review completed. Granite Godman, RN, BSN. 

## 2015-10-03 NOTE — Progress Notes (Signed)
Pt complaining of a headache. No PRN meds for pain ordered at this time. Called over and spoke to [facility] for a PRN order. Pt is A/O and has been on [place] since 1430. Will continue to monitor.

## 2015-10-03 NOTE — ED Notes (Signed)
Patient arrives via EMS from [facility]) with complaint of fever and headache. Per staff at facility patient was noted to have fever yesterday around lunch time. Temp at that time was 104.61F per EMS. Staff there gave Tylenol. Temp was rechecked around 2300 and found to "have not changed"; second reading was 103F. Patient alert and oriented. Normally able to weight bear on left leg, but wasn't tonight per caregivers via EMS. 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 Patient only complaining of headache. Right BKA history.

## 2015-10-03 NOTE — Progress Notes (Signed)
Arterial blood gas obtained while patient was wearing oxygen set at 6lpm. After abg was obtained placed patient on Bipap. Critical results called to nurse Jill Poling

## 2015-10-04 ENCOUNTER — Inpatient Hospital Stay (HOSPITAL_COMMUNITY): Payer: Medicare Other

## 2015-10-04 DIAGNOSIS — N183 Chronic kidney disease, stage 3 (moderate): Secondary | ICD-10-CM

## 2015-10-04 DIAGNOSIS — N179 Acute kidney failure, unspecified: Secondary | ICD-10-CM | POA: Insufficient documentation

## 2015-10-04 DIAGNOSIS — E1121 Type 2 diabetes mellitus with diabetic nephropathy: Secondary | ICD-10-CM

## 2015-10-04 LAB — GLUCOSE, CAPILLARY
GLUCOSE-CAPILLARY: 195 mg/dL — AB (ref 65–99)
GLUCOSE-CAPILLARY: 198 mg/dL — AB (ref 65–99)
GLUCOSE-CAPILLARY: 203 mg/dL — AB (ref 65–99)
GLUCOSE-CAPILLARY: 213 mg/dL — AB (ref 65–99)
GLUCOSE-CAPILLARY: 264 mg/dL — AB (ref 65–99)
GLUCOSE-CAPILLARY: 278 mg/dL — AB (ref 65–99)

## 2015-10-04 LAB — PROCALCITONIN: Procalcitonin: 4.42 ng/mL

## 2015-10-04 LAB — URINE CULTURE

## 2015-10-04 LAB — CBC WITH DIFFERENTIAL/PLATELET
Basophils Absolute: 0 10*3/uL (ref 0.0–0.1)
Basophils Relative: 0 %
EOS ABS: 0 10*3/uL (ref 0.0–0.7)
EOS PCT: 0 %
HCT: 34.2 % — ABNORMAL LOW (ref 36.0–46.0)
HEMOGLOBIN: 10.3 g/dL — AB (ref 12.0–15.0)
LYMPHS ABS: 0.5 10*3/uL — AB (ref 0.7–4.0)
LYMPHS PCT: 3 %
MCH: 28 pg (ref 26.0–34.0)
MCHC: 30.1 g/dL (ref 30.0–36.0)
MCV: 92.9 fL (ref 78.0–100.0)
MONOS PCT: 2 %
Monocytes Absolute: 0.3 10*3/uL (ref 0.1–1.0)
Neutro Abs: 15.7 10*3/uL — ABNORMAL HIGH (ref 1.7–7.7)
Neutrophils Relative %: 95 %
Platelets: 166 10*3/uL (ref 150–400)
RBC: 3.68 MIL/uL — AB (ref 3.87–5.11)
RDW: 16.1 % — ABNORMAL HIGH (ref 11.5–15.5)
WBC: 16.5 10*3/uL — AB (ref 4.0–10.5)

## 2015-10-04 LAB — BASIC METABOLIC PANEL
Anion gap: 8 (ref 5–15)
BUN: 29 mg/dL — AB (ref 6–20)
CHLORIDE: 104 mmol/L (ref 101–111)
CO2: 32 mmol/L (ref 22–32)
CREATININE: 1.81 mg/dL — AB (ref 0.44–1.00)
Calcium: 8.6 mg/dL — ABNORMAL LOW (ref 8.9–10.3)
GFR calc Af Amer: 32 mL/min — ABNORMAL LOW (ref >60)
GFR calc non Af Amer: 27 mL/min — ABNORMAL LOW (ref >60)
GLUCOSE: 240 mg/dL — AB (ref 65–99)
POTASSIUM: 4 mmol/L (ref 3.5–5.1)
SODIUM: 144 mmol/L (ref 135–145)

## 2015-10-04 LAB — MAGNESIUM: Magnesium: 2.1 mg/dL (ref 1.7–2.4)

## 2015-10-04 LAB — PHOSPHORUS: Phosphorus: 3.1 mg/dL (ref 2.5–4.6)

## 2015-10-04 MED ORDER — IPRATROPIUM-ALBUTEROL 0.5-2.5 (3) MG/3ML IN SOLN
3.0000 mL | Freq: Four times a day (QID) | RESPIRATORY_TRACT | Status: DC
  Administered 2015-10-04 – 2015-10-06 (×8): 3 mL via RESPIRATORY_TRACT
  Filled 2015-10-04 (×8): qty 3

## 2015-10-04 MED ORDER — HEPARIN SODIUM (PORCINE) 5000 UNIT/ML IJ SOLN
5000.0000 [IU] | Freq: Three times a day (TID) | INTRAMUSCULAR | Status: DC
  Administered 2015-10-04 – 2015-10-10 (×17): 5000 [IU] via SUBCUTANEOUS
  Filled 2015-10-04 (×16): qty 1

## 2015-10-04 MED ORDER — METHYLPREDNISOLONE SODIUM SUCC 40 MG IJ SOLR
40.0000 mg | Freq: Two times a day (BID) | INTRAMUSCULAR | Status: DC
  Administered 2015-10-04 – 2015-10-05 (×3): 40 mg via INTRAVENOUS
  Filled 2015-10-04 (×4): qty 1

## 2015-10-04 NOTE — Consult Note (Signed)
PULMONARY / CRITICAL CARE MEDICINE   Name: Heather Zimmerman. [place]land MRN: [id number] DOB: [birth year]    ADMISSION DATE:  2015/10/13 CONSULTATION DATE: 10-13-2023  REFERRING MD: Triad  CHIEF COMPLAINT:  AMS/Fever  HISTORY OF PRESENT ILLNESS:   69 yo MO(327 lbs) with a plethora of health issues who presents with 24 hours of fever to 104, decreased LOC, with known lower ext ulcer with extensive tissue excoriation. Given 4 litre of IVF and V/Z in ED and moved to SDU. She became more lethargic and pco2 was 76 with PH 7.26. Placed on Bipap and PCCM consulted at [phone] and PCCM to bedside at 0725. She will be moved to ICU, placed on PCCM service. She may need intubation and cvl vs EOL discussion.  SUBJECTIVE:  More awake, no fevers last 24 hr  VITAL SIGNS: BP 72/51 mmHg  Pulse 73  Temp(Src) 97.8 F (36.6 C) (Oral)  Resp 22  Ht 4\' 10"  (1.473 m)  Wt 156.3 kg (344 lb 9.3 oz)  BMI 72.04 kg/m2  SpO2 99%  HEMODYNAMICS:    VENTILATOR SETTINGS: Vent Mode:  [-] BIPAP;PCV FiO2 (%):  [40 %] 40 % Set Rate:  [18 bmp] 18 bmp PEEP:  [10 cmH20] 10 cmH20  INTAKE / OUTPUT: I/O last 3 completed shifts: In: 5939.5 [P.O.:50; I.V.:5190; IV Piggyback:699.5] Out: [id number] [Urine:2405]  PHYSICAL EXAMINATION: General: MO AAF, awake alert, no distress Neuro:  Opens eyes, fc, nonfocAl , supple neck HEENT: jvd Cardiovascular:  HSD SR Lungs: coarse Abdomen: obese , decreased bs Musculoskeletal: rt bka, left lower ext with extensive ulcers and skin sloughing Skin:  Left lower ext ulcer and excoriation  LABS:  BMET  Recent Labs Lab 10/13/2015 0149 10/13/15 0718 10/04/15 0328  NA 143 143 144  K 4.3 4.3 4.0  CL 98* 103 104  CO2 36* 34* 32  BUN 27* 26* 29*  CREATININE 1.69* 1.77* 1.81*  GLUCOSE 199* 208* 240*    Electrolytes  Recent Labs Lab October 13, 2015 0149 Oct 13, 2015 0718 10/04/15 0328  CALCIUM 9.0 8.3* 8.6*  MG  --   --  2.1  PHOS  --   --  3.1    CBC  Recent Labs Lab 2015-10-13 0149  Oct 13, 2015 0718 10/04/15 0328  WBC 15.1* 18.6* 16.5*  HGB 12.8 10.3* 10.3*  HCT 43.1 33.9* 34.2*  PLT 188 188 166    Coag's  Recent Labs Lab October 13, 2015 0718  APTT 32  INR 1.20    Sepsis Markers  Recent Labs Lab Oct 13, 2015 0407 13-Oct-2015 0718 13-Oct-2015 1017 Oct 13, 2015 1225 10/04/15 0328  LATICACIDVEN 1.40 1.1 1.2  --   --   PROCALCITON  --  4.40  --  5.20 4.42    ABG  Recent Labs Lab 2015/10/13 0619  PHART 7.251*  PCO2ART 78.7*  PO2ART 88.0    Liver Enzymes  Recent Labs Lab 10-13-2015 0149 10-13-15 0718  AST 22 23  ALT 18 17  ALKPHOS 97 80  BILITOT 0.8 1.0  ALBUMIN 2.7* 2.3*    Cardiac Enzymes  Recent Labs Lab October 13, 2015 0718 2015/10/13 1225 13-Oct-2015 1841  TROPONINI 0.05* 0.03 0.03    Glucose  Recent Labs Lab 2015/10/13 1132 13-Oct-2015 1548 10/13/2015 1954 Oct 13, 2015 2352 10/04/15 0404 10/04/15 0808  GLUCAP 183* 185* 182* 203* 195* 198*    Imaging No results found.   STUDIES:    CULTURES: 2023/10/13 UC>> October 13, 2023 bc>> 10-13-2023 procal 2.02, lactic acid 1.59  ANTIBIOTICS: 10/13/23 vanc>> 12/27 zoysn>>  SIGNIFICANT EVENTS:   LINES/TUBES:   DISCUSSION: 69 yo MO(327  lbs) with a plethora of health issues who presents with 24 hours of fever to 104, decreased LOC, with known lower ext ulcer with extensive tissue excoriation. Given 4 litre of IVF and V/Z in ED and moved to SDU. She became more lethargic and pco2 was 76 with PH 7.26. Placed on Bipap and PCCM consulted at [phone] and PCCM to bedside at 0725. She will be moved to ICU, placed on PCCM service. She may need intubation and cvl vs EOL discussion.  ASSESSMENT / PLAN:  PULMONARY A: Hypercarbic resp failure imrpoved pulm edema, diastolic chf OSA P:   NIMVS - dc pcxr in am  Is Neg balance  clinically Co2 improved  CARDIOVASCULAR A:  Hypotension accuracy in ? CHF on chronic diuresis  P:  Hold lasix, follow crt  tele  RENAL Lab Results  Component Value Date   CREATININE 1.81* 10/04/2015    CREATININE 1.77* 10/03/2015   CREATININE 1.69* 10/03/2015   CREATININE 1.40* 11/05/2014   CREATININE 1.30* 10/22/2014   CREATININE 1.3* 08/01/2014    A:   CRI, slight irse wit hlasix P:   Avoid nephrotoxins Dc lasix Chem in am  kvo  GASTROINTESTINAL A:   GI protection P:   PPI Start fulls  HEMATOLOGIC A:   Jehovah witness , dvt prevention P:  scd Add sub q hep  INFECTIOUS A:   Presumed left lower ext ulcer infection, PCT downward and clinically better P:   unlikely enceph with rapid resolution neuro and fevers, dc acyclovir Follow urine culture Maintain zosyn for now May need renal US for hydro   ENDOCRINE A:   DM  P:   SSI  NEUROLOGIC A:   Lethargic but responds to strong stimuli. Component of TME with hypercarbia and sepsis - resolved P:   Avoid sedation / benzo NO LP indicated with rapid resolution and resp as cuase noted   FAMILY  - Updates:   - Inter-disciplinary family meet or Palliative Care meeting due by:  day 7  To traid Tele, pulse ox   Lavon Paganini. Titus Mould, MD, [place] Pgr: [facility]

## 2015-10-04 NOTE — Progress Notes (Signed)
Attempted to call report. 3E RN at lunch and will call back in about 10 minutes.

## 2015-10-04 NOTE — Progress Notes (Signed)
Removed BiPAP and placed on 6L nasal cannula to give patient a break. She is tolerating it well and RT will continue to monitor.

## 2015-10-05 ENCOUNTER — Inpatient Hospital Stay (HOSPITAL_COMMUNITY): Payer: Medicare Other

## 2015-10-05 DIAGNOSIS — L03116 Cellulitis of left lower limb: Secondary | ICD-10-CM

## 2015-10-05 DIAGNOSIS — Z794 Long term (current) use of insulin: Secondary | ICD-10-CM

## 2015-10-05 DIAGNOSIS — I5033 Acute on chronic diastolic (congestive) heart failure: Secondary | ICD-10-CM

## 2015-10-05 LAB — GLUCOSE, CAPILLARY
GLUCOSE-CAPILLARY: 241 mg/dL — AB (ref 65–99)
GLUCOSE-CAPILLARY: 355 mg/dL — AB (ref 65–99)
GLUCOSE-CAPILLARY: 325 mg/dL — AB (ref 65–99)
Glucose-Capillary: 241 mg/dL — ABNORMAL HIGH (ref 65–99)
Glucose-Capillary: 242 mg/dL — ABNORMAL HIGH (ref 65–99)
Glucose-Capillary: 314 mg/dL — ABNORMAL HIGH (ref 65–99)

## 2015-10-05 LAB — BASIC METABOLIC PANEL
Anion gap: 10 (ref 5–15)
BUN: 31 mg/dL — AB (ref 6–20)
CALCIUM: 8.6 mg/dL — AB (ref 8.9–10.3)
CO2: 32 mmol/L (ref 22–32)
CREATININE: 1.6 mg/dL — AB (ref 0.44–1.00)
Chloride: 99 mmol/L — ABNORMAL LOW (ref 101–111)
GFR calc Af Amer: 37 mL/min — ABNORMAL LOW (ref >60)
GFR, EST NON AFRICAN AMERICAN: 32 mL/min — AB (ref >60)
GLUCOSE: 260 mg/dL — AB (ref 65–99)
Potassium: 4 mmol/L (ref 3.5–5.1)
Sodium: 141 mmol/L (ref 135–145)

## 2015-10-05 LAB — MAGNESIUM: MAGNESIUM: 2.1 mg/dL (ref 1.7–2.4)

## 2015-10-05 LAB — CBC
HCT: 33.2 % — ABNORMAL LOW (ref 36.0–46.0)
Hemoglobin: 10.1 g/dL — ABNORMAL LOW (ref 12.0–15.0)
MCH: 27.8 pg (ref 26.0–34.0)
MCHC: 30.4 g/dL (ref 30.0–36.0)
MCV: 91.5 fL (ref 78.0–100.0)
PLATELETS: 180 10*3/uL (ref 150–400)
RBC: 3.63 MIL/uL — ABNORMAL LOW (ref 3.87–5.11)
RDW: 16.1 % — AB (ref 11.5–15.5)
WBC: 17.2 10*3/uL — ABNORMAL HIGH (ref 4.0–10.5)

## 2015-10-05 LAB — PROCALCITONIN: Procalcitonin: 2.34 ng/mL

## 2015-10-05 LAB — PHOSPHORUS: PHOSPHORUS: 3.1 mg/dL (ref 2.5–4.6)

## 2015-10-05 MED ORDER — FUROSEMIDE 10 MG/ML IJ SOLN
40.0000 mg | Freq: Two times a day (BID) | INTRAMUSCULAR | Status: DC
  Administered 2015-10-05 – 2015-10-09 (×9): 40 mg via INTRAVENOUS
  Filled 2015-10-05 (×8): qty 4

## 2015-10-05 MED ORDER — INSULIN GLARGINE 100 UNIT/ML ~~LOC~~ SOLN
20.0000 [IU] | Freq: Every day | SUBCUTANEOUS | Status: DC
  Administered 2015-10-05 – 2015-10-06 (×2): 20 [IU] via SUBCUTANEOUS
  Filled 2015-10-05 (×3): qty 0.2

## 2015-10-05 MED ORDER — INSULIN ASPART 100 UNIT/ML ~~LOC~~ SOLN
0.0000 [IU] | Freq: Three times a day (TID) | SUBCUTANEOUS | Status: DC
  Administered 2015-10-06 (×2): 11 [IU] via SUBCUTANEOUS
  Administered 2015-10-06: 15 [IU] via SUBCUTANEOUS
  Administered 2015-10-07: 5 [IU] via SUBCUTANEOUS
  Administered 2015-10-07: 8 [IU] via SUBCUTANEOUS
  Administered 2015-10-07: 15 [IU] via SUBCUTANEOUS
  Administered 2015-10-08: 3 [IU] via SUBCUTANEOUS
  Administered 2015-10-08: 11 [IU] via SUBCUTANEOUS
  Administered 2015-10-08 – 2015-10-09 (×3): 3 [IU] via SUBCUTANEOUS
  Administered 2015-10-09: 2 [IU] via SUBCUTANEOUS
  Administered 2015-10-09: 8 [IU] via SUBCUTANEOUS
  Administered 2015-10-10 (×2): 5 [IU] via SUBCUTANEOUS

## 2015-10-05 MED ORDER — DEXTROSE 5 % IV SOLN
1.0000 g | Freq: Three times a day (TID) | INTRAVENOUS | Status: DC
  Administered 2015-10-05 – 2015-10-10 (×14): 1 g via INTRAVENOUS
  Filled 2015-10-05 (×16): qty 1

## 2015-10-05 MED ORDER — METHYLPREDNISOLONE SODIUM SUCC 40 MG IJ SOLR
40.0000 mg | Freq: Every day | INTRAMUSCULAR | Status: DC
  Administered 2015-10-06: 40 mg via INTRAVENOUS
  Filled 2015-10-05: qty 1

## 2015-10-05 MED ORDER — INSULIN ASPART 100 UNIT/ML ~~LOC~~ SOLN
0.0000 [IU] | Freq: Every day | SUBCUTANEOUS | Status: DC
  Administered 2015-10-05: 4 [IU] via SUBCUTANEOUS
  Administered 2015-10-06: 5 [IU] via SUBCUTANEOUS
  Administered 2015-10-07: 3 [IU] via SUBCUTANEOUS
  Administered 2015-10-08 – 2015-10-09 (×2): 2 [IU] via SUBCUTANEOUS

## 2015-10-05 NOTE — Progress Notes (Signed)
Pt a/o, muffled speech, no c/o pain, VSS, pt stable, IV abx as ordered

## 2015-10-05 NOTE — Progress Notes (Signed)
Inpatient Diabetes Program Recommendations  AACE/ADA: New Consensus Statement on Inpatient Glycemic Control (2015)  Target Ranges:  Prepandial:   less than 140 mg/dL      Peak postprandial:   less than 180 mg/dL (1-2 hours)      Critically ill patients:  140 - 180 mg/dL  Results for Heather Zimmerman, Heather Zimmerman (MRN [id number]) as of 10/05/2015 08:57  Ref. Range 10/04/2015 04:04 10/04/2015 08:08 10/04/2015 11:36 10/04/2015 17:23 10/04/2015 22:01 10/05/2015 00:24 10/05/2015 03:58 10/05/2015 07:56  Glucose-Capillary Latest Ref Range: 65-99 mg/dL 195 (H) 198 (H) 213 (H) 264 (H) 278 (H) 242 (H) 241 (H) 241 (H)   Review of Glycemic Control  Diabetes history: DM2 Outpatient Diabetes medications: Lantus 20 units QAM, Novolog 5 units TID with meals Current orders for Inpatient glycemic control: Lantus 10 units QHS, Novolog 0-9 units Q4H  Inpatient Diabetes Program Recommendations: Insulin - Basal: Please consider increasing Lantus to 15 units QHS.  Thanks, Barnie Alderman, RN, MSN, CDE Diabetes Coordinator Inpatient Diabetes Program [phone] (Team Pager from [place] to [place]) [phone] ([facility] office) [phone] [facility] office) [phone] [facility] office)

## 2015-10-05 NOTE — Progress Notes (Signed)
PROGRESS NOTE  Heather Zimmerman. Heather Zimmerman VW:[id number] DOB: 12/02/1945 DOA: 10/03/2015 PCP: Heather Cranker, DO  Brief History 69 year old female with a history of diabetes mellitus, hypertension, diastolic CHF, chronic pain syndrome presented from a nursing home after the patient was found to have persistent fever and headache on the day prior to admission. Upon presentation, the patient was confused and lethargic with a fever of 104F. Because of concerns for sepsis, the patient was given antibiotics and 4 L of normal saline in the emergency department. Initial blood gas showed pH 7.26 with PCO2 78. The patient was placed on BiPAP and subsequently moved to the ICU secondary to hypercarbic respiratory failure. She was noted to have WBC 18.6 and procalcitonin peaked at 5.20.  Notably, the patient was recently discharged from the hospital on 07/21/2015 after treatment for sepsis secondary to left lower extremity cellulitis. Assessment/Plan: Acute on chronic hypercapnic and hypoxemic respiratory failure -secondary to CHF in setting of OSA/OHS -pt is approx 10-12 pound above her weight in June 2016 -Start IV furosemide -daily weights -pt normally on 2L [place]  Acute on chronic diastolic CHF -Start furosemide 40 mg twice a day -Daily weights -pt is approx 10-12 pound above her weight in June 2016 (333) -Secondary to OHS/OSA and likely right-sided heart failure -06/21/2014 echo EF [id number], grade 1 diastolic dysfunction, mildly dilated RV -Repeat echocardiogram  Cellulitis left lower extremity/foot -Appreciate wound care consult  -Continue vancomycin -Discontinue Zosyn -Start ceftazidime  OHS/OSA -continue supplemental oxygen -bronchial hygiene  Acute encephalopathy -secondary to infectious process -improved -back to baseline  CKD stage III -Baseline creatinine 1.4-1.7 -monitor with diuresis  Diabetes mellitus type 2 with nephropathy -CBGs elevated partly due to steroids -Wean  steroids -Increase Lantus to 20 units at bedtime -Hemoglobin A1c  Hypertension -The patient has had soft blood pressures, but question accuracy of readings -Monitor blood pressure with diuresis   Family Communication:   Pt at beside Disposition Plan:   Home when medically stable      Procedures/Studies: Ct Head Wo Contrast  10/03/2015  CLINICAL DATA:  Fever and headache. Evaluate for intracranial hemorrhage. EXAM: CT HEAD WITHOUT CONTRAST TECHNIQUE: Contiguous axial images were obtained from the base of the skull through the vertex without intravenous contrast. COMPARISON:  None. FINDINGS: Motion degraded study which could obscure pathology. Skull and Sinuses:No evidence of acute fracture or destructive process. Right maxillary sinus opacification and atelectasis compatible with chronic obstruction. Visualized orbits: Remote blowout fracture of the medial wall on the right. No acute finding. Brain: No evidence of acute infarction, hemorrhage, hydrocephalus, or mass lesion/mass effect. IMPRESSION: 1. No acute finding. 2. Moderate limitation due to motion. 3. Chronic right maxillary sinus obstruction. Electronically Signed   By: Heather Zimmerman M.D.   On: 10/03/2015 03:27   US Renal  10/04/2015  CLINICAL DATA:  Acute renal failure, diabetes, hypertension EXAM: RENAL / URINARY TRACT ULTRASOUND COMPLETE COMPARISON:  None. FINDINGS: Right Kidney: Length: 12.7 cm. Echogenicity within normal limits. There is a lower pole cyst measures 3.7 x 3.5 cm. Left Kidney: Length: 10.9 cm in length. Limited visualization of the left kidney due to patient's large body habitus. No hydronephrosis. Bladder: A Foley catheter is noted in decompressed urinary bladder. IMPRESSION: No hydronephrosis. There is a cyst in lower pole of the right kidney measures 3.7 x 3.5 cm. Suboptimal visualization of the left kidney due to patient's large body habitus. Decompressed urinary bladder with Foley catheter. Electronically  Signed  By: Heather Zimmerman M.D.   On: 10/04/2015 12:39   Dg Chest Port 1 View  10/03/2015  CLINICAL DATA:  Patient with respiratory failure, ventilator dependent. EXAM: PORTABLE CHEST 1 VIEW COMPARISON:  Chest radiograph 10/03/2015. FINDINGS: Monitoring leads overlie the patient. Stable enlarged cardiac and mediastinal contours. Low lung volumes. Unchanged pulmonary vascular redistribution and interstitial pulmonary edema. No large area of pulmonary consolidation or pleural effusion. IMPRESSION: Cardiomegaly with mild interstitial pulmonary edema, grossly unchanged from recent prior exam. Electronically Signed   By: Heather Zimmerman M.D.   On: 10/03/2015 07:15   Dg Chest Port 1 View  10/03/2015  CLINICAL DATA:  69-year-old female with sepsis and fever. EXAM: PORTABLE CHEST 1 VIEW COMPARISON:  Radiograph dated 07/18/2015 FINDINGS: Single portable view of the chest demonstrate cardiomegaly with central vascular prominence compatible with congestive changes. There is no focal consolidation, pleural effusion, or pneumothorax. There is degenerative changes of the shoulders. No acute fracture. IMPRESSION: Cardiomegaly with congestive changes.  No focal consolidation. Electronically Signed   By: Heather Zimmerman M.D.   On: 10/03/2015 02:24         Subjective: Patient complains of some dyspnea on exertion. Denies any fevers, chills, chest pain, nausea, vomiting, diarrhea. She feels some fullness to her abdomen   Objective: Filed Vitals:   10/04/15 1400 10/04/15 1518 10/04/15 2322 10/05/15 0058  BP: 109/54 110/58 119/46 130/59  Pulse: 86 89 64 66  Temp:  98.1 F (36.7 C) 97.5 F (36.4 C) 98.1 F (36.7 C)  TempSrc:  Oral Oral Oral  Resp: 20 18 18 18   Height:  5\' 4"  (1.626 m)    Weight:      SpO2: 90% 98% 96% 95%    Intake/Output Summary (Last 24 hours) at 10/05/15 [id number] Last data filed at 10/05/15 0417  Gross per 24 hour  Intake   1591 ml  Output   2341 ml  Net   -750 ml   Weight change:    Exam:   General:  Pt is alert, follows commands appropriately, not in acute distress  HEENT: No icterus, No thrush, No neck mass, [place]/AT  Cardiovascular: RRR, S1/S2, no rubs, no gallops  Respiratory: bibasilar crackles. No wheeze. Good air movement   Abdomen: Soft/+BS, non tender, non distended, no guarding  Extremities: 2+LE LLE edema, No lymphangitis, No petechiae, No rashes, no synovitis; erythema and edema to the left foot. Right stump without any open wounds. Left leg ulcerations without any crepitance or necrosis.   Data Reviewed: Basic Metabolic Panel:  Recent Labs Lab 10/03/15 0149 10/03/15 0718 10/04/15 0328 10/05/15 0433  NA 143 143 144 141  K 4.3 4.3 4.0 4.0  CL 98* 103 104 99*  CO2 36* 34* 32 32  GLUCOSE 199* 208* 240* 260*  BUN 27* 26* 29* 31*  CREATININE 1.69* 1.77* 1.81* 1.60*  CALCIUM 9.0 8.3* 8.6* 8.6*  MG  --   --  2.1 2.1  PHOS  --   --  3.1 3.1   Liver Function Tests:  Recent Labs Lab 10/03/15 0149 10/03/15 0718  AST 22 23  ALT 18 17  ALKPHOS 97 80  BILITOT 0.8 1.0  PROT 7.7 6.6  ALBUMIN 2.7* 2.3*   No results for input(s): LIPASE, AMYLASE in the last 168 hours. No results for input(s): AMMONIA in the last 168 hours. CBC:  Recent Labs Lab 10/03/15 0149 10/03/15 0718 10/04/15 0328 10/05/15 0433  WBC 15.1* 18.6* 16.5* 17.2*  NEUTROABS 13.4* 17.4* 15.7*  --  HGB 12.8 10.3* 10.3* 10.1*  HCT 43.1 33.9* 34.2* 33.2*  MCV 92.7 92.1 92.9 91.5  PLT 188 188 166 180   Cardiac Enzymes:  Recent Labs Lab 10/03/15 0718 10/03/15 1225 10/03/15 1841  TROPONINI 0.05* 0.03 0.03   BNP: Invalid input(s): POCBNP CBG:  Recent Labs Lab 10/04/15 1136 10/04/15 1723 10/04/15 2201 10/05/15 0024 10/05/15 0358  GLUCAP 213* 264* 278* 242* 241*    Recent Results (from the past 240 hour(s))  Blood Culture (routine x 2)     Status: None (Preliminary result)   Collection Time: 10/03/15  1:48 AM  Result Value Ref Range Status   Specimen  Description BLOOD LEFT ARM  Final   Special Requests AEROBIC BOTTLE ONLY 5ML  Final   Culture NO GROWTH 1 DAY  Final   Report Status PENDING  Incomplete  Blood Culture (routine x 2)     Status: None (Preliminary result)   Collection Time: 10/03/15  2:00 AM  Result Value Ref Range Status   Specimen Description BLOOD RIGHT ARM  Final   Special Requests BOTTLES DRAWN AEROBIC AND ANAEROBIC 5ML  Final   Culture NO GROWTH 1 DAY  Final   Report Status PENDING  Incomplete  Urine culture     Status: None   Collection Time: 10/03/15  2:35 AM  Result Value Ref Range Status   Specimen Description URINE, RANDOM  Final   Special Requests NONE  Final   Culture MULTIPLE SPECIES PRESENT, SUGGEST RECOLLECTION  Final   Report Status 10/04/2015 FINAL  Final  MRSA PCR Screening     Status: Abnormal   Collection Time: 10/03/15  6:00 AM  Result Value Ref Range Status   MRSA by PCR POSITIVE (A) NEGATIVE Final    Comment:        The GeneXpert MRSA Assay (FDA approved for NASAL specimens only), is one component of a comprehensive MRSA colonization surveillance program. It is not intended to diagnose MRSA infection nor to guide or monitor treatment for MRSA infections. RESULT CALLED TO, READ BACK BY AND VERIFIED WITH: Ferrel Logan RN 10:15 10/03/15 ([email or web address])      Scheduled Meds: . antiseptic oral rinse  7 mL Mouth Rinse BID  . Chlorhexidine Gluconate Cloth  6 each Topical Q0600  . heparin subcutaneous  5,000 Units Subcutaneous 3 times per day  . insulin aspart  0-9 Units Subcutaneous 6 times per day  . insulin glargine  10 Units Subcutaneous QHS  . ipratropium-albuterol  3 mL Nebulization Q6H  . methylPREDNISolone (SOLU-MEDROL) injection  40 mg Intravenous Q12H  . mupirocin ointment  1 application Nasal BID  . piperacillin-tazobactam (ZOSYN)  IV  3.375 g Intravenous 3 times per day  . sodium chloride  3 mL Intravenous Q12H  . vancomycin  1,750 mg Intravenous Q24H   Continuous Infusions:    Emillie Chasen,  Merleen Picazo, DO  [facility] Pager [phone]  If 7PM-7AM, please contact night-coverage [email or web address] Password [email or web address] 10/05/2015, 6:28 AM   LOS: 2 days

## 2015-10-05 NOTE — NC FL2 (Signed)
[place] LEVEL OF CARE SCREENING TOOL     IDENTIFICATION  Patient Name: Heather Zimmerman. Heather Zimmerman Birthdate: 1946-01-24 Sex: female Admission Date (Current Location): 10/03/2015  [place] and [place] Number:  Herbalist and Address:  The . [facility], [place] [address], [place], [place] [address]      Provider Number: [id number]  Attending Physician Name and Address:  Orson Eva, MD  Relative Name and Phone Number:       Current Level of Care: Hospital Recommended Level of Care: [place] Prior Approval Number:    Date Approved/Denied:   PASRR Number:    Discharge Plan: SNF    Current Diagnoses: Patient Active Problem List   Diagnosis Date Noted  . Acute on chronic diastolic CHF (congestive heart failure) ([place]) 10/05/2015  . Cellulitis of foot, left 10/05/2015  . AKI (acute kidney injury) ([place])   . DM (diabetes mellitus), type 2 with renal complications ([place]) [id number]  . Hypertensive heart disease with congestive heart failure and stage 3 kidney disease ([place]) 07/24/2015  . CKD (chronic kidney disease) stage 3, GFR 30-59 ml/min 07/21/2015  . Acute on chronic respiratory failure ([place]) 07/21/2015  . Cellulitis of leg, left 07/19/2015  . Acute encephalopathy 07/18/2015  . Diabetes mellitus with complication ([place]) 0000000  . Open wound of left lower extremity 07/18/2015  . Acute respiratory failure ([place]) 07/18/2015  . Sepsis ([place]) 07/18/2015  . Urinary retention 07/18/2015  . HCAP (healthcare-associated pneumonia) 07/17/2015  . Fever 07/17/2015  . Venous ulcer of left leg ([place]) 12/27/2014  . Type II diabetes mellitus with neurological manifestations ([place]) 11/22/2014  . Hx of right BKA ([place]) 11/22/2014  . Peripheral autonomic neuropathy due to diabetes mellitus ([place]) 10/09/2014  . Obstructive sleep apnea 07/05/2014  . Acute on chronic respiratory failure with hypercapnia ([place]) 06/21/2014  . Pruritic disorder 02/13/2014   . Morbid obesity ([place]) 08/30/2013  . Unspecified vitamin D deficiency 12/31/2012  . Disorder of magnesium metabolism 12/31/2012  . Depression 12/31/2012  . Chronic pain 12/31/2012  . Chronic diastolic heart failure ([place]) 12/31/2012  . Allergic rhinitis due to pollen 12/31/2012  . GERD 12/31/2012    Orientation RESPIRATION BLADDER Height & Weight    Self, Time, Situation, Place  O2, Other (Comment) (Continuous 02 2LPM, CPAP at night) Indwelling catheter 5\' 4"  (162.6 cm) 336 lbs.  BEHAVIORAL SYMPTOMS/MOOD NEUROLOGICAL BOWEL NUTRITION STATUS      Continent  (carb modified)  AMBULATORY STATUS COMMUNICATION OF NEEDS Skin     Verbally Other (Comment) (L chronic venous stasis ulcers)                       Personal Care Assistance Level of Assistance  Bathing, Dressing Bathing Assistance: Limited assistance   Dressing Assistance: Limited assistance     Functional Limitations Info             SPECIAL CARE FACTORS FREQUENCY  PT (By licensed PT), OT (By licensed OT)                    Contractures Contractures Info: Not present    Additional Factors Info  Insulin Sliding Scale               Current Medications (10/05/2015):  This is the current hospital active medication list Current Facility-Administered Medications  Medication Dose Route Frequency Provider Last Rate Last Dose  . antiseptic oral rinse (CPC / CETYLPYRIDINIUM CHLORIDE 0.05%) solution 7 mL  7 mL Mouth Rinse BID Raylene Miyamoto, MD   7 mL at 10/05/15 1000  . cefTAZidime (FORTAZ) 1 g in dextrose 5 % 50 mL IVPB  1 g Intravenous 3 times per day Orson Eva, MD      . Chlorhexidine Gluconate Cloth 2 % PADS 6 each  6 each Topical Q0600 Raylene Miyamoto, MD   6 each at 10/05/15 0430  . furosemide (LASIX) injection 40 mg  40 mg Intravenous Q12H Orson Eva, MD   40 mg at 10/05/15 2029  . heparin injection 5,000 Units  5,000 Units Subcutaneous 3 times per day Raylene Miyamoto, MD   5,000 Units at  10/05/15 1211  . [START ON 10/06/2015] insulin aspart (novoLOG) injection 0-15 Units  0-15 Units Subcutaneous TID WC Orson Eva, MD      . insulin aspart (novoLOG) injection 0-5 Units  0-5 Units Subcutaneous QHS David Tat, MD      . insulin glargine (LANTUS) injection 20 Units  20 Units Subcutaneous QHS David Tat, MD      . ipratropium-albuterol (DUONEB) 0.5-2.5 (3) MG/3ML nebulizer solution 3 mL  3 mL Nebulization Q6H Rise Patience, MD   3 mL at 10/05/15 2057  . [START ON 10/06/2015] methylPREDNISolone sodium succinate (SOLU-MEDROL) 40 mg/mL injection 40 mg  40 mg Intravenous Daily Orson Eva, MD      . mupirocin ointment (BACTROBAN) 2 % 1 application  1 application Nasal BID Raylene Miyamoto, MD   1 application at [id number]  . sodium chloride 0.9 % injection 3 mL  3 mL Intravenous Q12H Rise Patience, MD   3 mL at 10/05/15 1000  . vancomycin (VANCOCIN) 1,750 mg in sodium chloride 0.9 % 500 mL IVPB  1,750 mg Intravenous Q24H Franky Macho, RPH   1,750 mg at 10/05/15 [id number]     Discharge Medications: Please see discharge summary for a list of discharge medications.  Relevant Imaging Results:  Relevant Lab Results:   Additional Information    Shaul Trautman M, LCSW

## 2015-10-06 LAB — BASIC METABOLIC PANEL
ANION GAP: 11 (ref 5–15)
BUN: 27 mg/dL — AB (ref 6–20)
CALCIUM: 9 mg/dL (ref 8.9–10.3)
CO2: 30 mmol/L (ref 22–32)
Chloride: 99 mmol/L — ABNORMAL LOW (ref 101–111)
Creatinine, Ser: 1.36 mg/dL — ABNORMAL HIGH (ref 0.44–1.00)
GFR calc Af Amer: 45 mL/min — ABNORMAL LOW (ref >60)
GFR calc non Af Amer: 38 mL/min — ABNORMAL LOW (ref >60)
GLUCOSE: 382 mg/dL — AB (ref 65–99)
Potassium: 4.2 mmol/L (ref 3.5–5.1)
Sodium: 140 mmol/L (ref 135–145)

## 2015-10-06 LAB — GLUCOSE, CAPILLARY
GLUCOSE-CAPILLARY: 325 mg/dL — AB (ref 65–99)
Glucose-Capillary: 333 mg/dL — ABNORMAL HIGH (ref 65–99)
Glucose-Capillary: 321 mg/dL — ABNORMAL HIGH (ref 65–99)
Glucose-Capillary: 369 mg/dL — ABNORMAL HIGH (ref 65–99)

## 2015-10-06 LAB — SEDIMENTATION RATE: SED RATE: 115 mm/h — AB (ref 0–22)

## 2015-10-06 LAB — MAGNESIUM: Magnesium: 2.1 mg/dL (ref 1.7–2.4)

## 2015-10-06 LAB — PHOSPHORUS: PHOSPHORUS: 2.7 mg/dL (ref 2.5–4.6)

## 2015-10-06 MED ORDER — ALBUTEROL SULFATE (2.5 MG/3ML) 0.083% IN NEBU: 2.5000 mg | INHALATION_SOLUTION | RESPIRATORY_TRACT | Status: DC | PRN

## 2015-10-06 MED ORDER — PREDNISONE 20 MG PO TABS
20.0000 mg | ORAL_TABLET | Freq: Once | ORAL | Status: AC
  Administered 2015-10-07: 20 mg via ORAL
  Filled 2015-10-06: qty 1

## 2015-10-06 MED ORDER — GABAPENTIN 300 MG PO CAPS
300.0000 mg | ORAL_CAPSULE | Freq: Two times a day (BID) | ORAL | Status: DC
  Administered 2015-10-06 – 2015-10-10 (×9): 300 mg via ORAL
  Filled 2015-10-06 (×9): qty 1

## 2015-10-06 MED ORDER — IPRATROPIUM-ALBUTEROL 0.5-2.5 (3) MG/3ML IN SOLN
3.0000 mL | Freq: Four times a day (QID) | RESPIRATORY_TRACT | Status: DC
  Administered 2015-10-07 – 2015-10-10 (×14): 3 mL via RESPIRATORY_TRACT
  Filled 2015-10-06 (×14): qty 3

## 2015-10-06 NOTE — Evaluation (Signed)
Occupational Therapy Evaluation Patient Details Name: Heather Zimmerman. Heather Zimmerman MRN: [id number] DOB: Sep 16, 1946 Today's Date: 10/06/2015    History of Present Illness Heather Zimmerman is a 69 y.o. female with history of morbid obesity, diabetes mellitus, CHF has brought from the nursing home after patient was found to have persistent fever and headache since yesterday. admitted for acute encephalopathy with possible developing sepsis source not clear could be UTI   Clinical Impression   This 69 yo female admitted with above presents to acute OT with obesity, decreased balance, decreased mobility all affecting her safety and increased independence with basic ADLs and associated mobility. She will benefit from acute OT with follow up OT at SNF.    Follow Up Recommendations  SNF    Equipment Recommendations  None recommended by OT       Precautions / Restrictions Precautions Precautions: Fall Restrictions Weight Bearing Restrictions: No      Mobility Bed Mobility Overal bed mobility: Needs Assistance;+ 2 for safety/equipment Bed Mobility: Rolling;Supine to Sit;Sit to Supine Rolling: Min assist   Supine to sit: Min assist (increased time) Sit to supine: Min assist   General bed mobility comments: with increased time,use of rails (side and head), bed in partial trendelenbery pt able to get herself moved up in bed     Balance Overall balance assessment: Needs assistance Sitting-balance support: Feet unsupported;Bilateral upper extremity supported Sitting balance-Leahy Scale: Good                                      ADL Overall ADL's : Needs assistance/impaired Eating/Feeding: Set up;Sitting   Grooming: Moderate assistance;Sitting   Upper Body Bathing: Minimal assitance;Sitting   Lower Body Bathing: Total assistance;Bed level   Upper Body Dressing : Minimal assistance;Sitting   Lower Body Dressing: Total assistance;Bed level                                  Pertinent Vitals/Pain Pain Assessment: 0-10 Pain Score: 1  Pain Location: R residual limb Pain Descriptors / Indicators: Aching Pain Intervention(s): Monitored during session;Repositioned     Hand Dominance Left   Extremity/Trunk Assessment Upper Extremity Assessment Upper Extremity Assessment: RUE deficits/detail RUE Deficits / Details: 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 RUE Coordination: decreased gross motor   Lower Extremity Assessment Lower Extremity Assessment: Defer to PT evaluation       Communication Communication Communication:  (speech is difficult to understand--requires increased effort at times)   Cognition Arousal/Alertness: Awake/alert Behavior During Therapy: WFL for tasks assessed/performed Overall Cognitive Status: Within Functional Limits for tasks assessed                                Home Living Family/patient expects to be discharged to:: Skilled nursing facility   Available Help at Discharge: [place]                                    Prior Functioning/Environment Level of Independence: Needs assistance  Gait / Transfers Assistance Needed: Pt reports that the staff at SNF used [name] to get her up from bed to W/C ADL's / Homemaking Assistance Needed: Pt reports she could do her UBB/D sitting EOB and  that staff mainly helped her with LBB/D        OT Diagnosis: Generalized weakness   OT Problem List: Decreased strength;Obesity;Impaired UE functional use;Impaired balance (sitting and/or standing)   OT Treatment/Interventions: Self-care/ADL training;Patient/family education;Balance training;Therapeutic activities;DME and/or AE instruction    OT Goals(Current goals can be found in the care plan section) Acute Rehab OT Goals Patient Stated Goal: to get more therapy (per her report she was no longer getting it at SNF) OT Goal Formulation: With patient Time For Goal  Achievement: 10/20/15 Potential to Achieve Goals: Good  OT Frequency: Min 2X/week           Co-evaluation PT/OT/SLP Co-Evaluation/Treatment: Yes (partial) Reason for Co-Treatment: Complexity of the patient's impairments (multi-system involvement);For patient/therapist safety PT goals addressed during session: Mobility/safety with mobility OT goals addressed during session: ADL's and self-care;Strengthening/ROM      End of Session Equipment Utilized During Treatment: Oxygen (2 liters)  Activity Tolerance: Patient tolerated treatment well Patient left: in bed;with call bell/phone within reach;with family/visitor present   Time: 1136-1209 OT Time Calculation (min): 33 min Charges:  OT General Charges $OT Visit: 1 Procedure OT Evaluation $Initial OT Evaluation Tier I: 1 Procedure  Almon Register [id number] 10/06/2015, 12:23 PM

## 2015-10-06 NOTE — Clinical Social Work Note (Signed)
Clinical Social Work Assessment  Patient Details  Name: Heather Zimmerman MRN: [id number] Date of Birth: August 14, 1946  Date of referral:  10/06/15               Reason for consult:  Facility Placement                Permission sought to share information with:  Family Supports, Chartered certified accountant granted to share information::  Yes, Verbal Permission Granted  Name::     unknown- sister and brother at bedside  Agency::  [facility]  Relationship::     Contact Information:     Housing/Transportation Living arrangements for the past 2 months:  [place] of Information:  Patient Patient Interpreter Needed:  None Criminal Activity/Legal Involvement Pertinent to Current Situation/Hospitalization:  No - Comment as needed Significant Relationships:  Siblings Lives with:  Facility Resident Do you feel safe going back to the place where you live?  Yes Need for family participation in patient care:  No (Coment)  Care giving concerns:  Pt is long term resident at [facility]- pt is not pleased with her care at that facility.   Social Worker assessment / plan:  CSW discussed pt return to [facility] at time of DC.  Pt and family expressed concern over return to [facility]- have not been pleased with their care there recently.  Pt sister is interested in finding placement in [place].  CSW explained complications with long term bed placement especially on such short notice and discussed pt return to [facility] while family looks into facility options in [place]  Employment status:  Retired Forensic scientist:  Medicare PT Recommendations:  Not assessed at this time Information / Referral to community resources:  [place]  Patient/Family's Response to care:  Pt and pt sister agreeable to return to [facility] while they examine other possible LTC SNF beds  Patient/Family's Understanding of and Emotional Response to Diagnosis,  Current Treatment, and Prognosis:  No questions or concerns.  Emotional Assessment Appearance:  Appears stated age Attitude/Demeanor/Rapport:    Affect (typically observed):  Appropriate Orientation:  Oriented to Self, Oriented to Place, Oriented to  Time, Oriented to Situation Alcohol / Substance use:  Not Applicable Psych involvement (Current and /or in the community):  No (Comment)  Discharge Needs  Concerns to be addressed:  Care Coordination Readmission within the last 30 days:  No Current discharge risk:  Physical Impairment Barriers to Discharge:  Continued Medical Work up   Cranford Mon, LCSW 10/06/2015, 12:24 PM

## 2015-10-06 NOTE — Progress Notes (Signed)
Inpatient Diabetes Program Recommendations  AACE/ADA: New Consensus Statement on Inpatient Glycemic Control (2015)  Target Ranges:  Prepandial:   less than 140 mg/dL      Peak postprandial:   less than 180 mg/dL (1-2 hours)      Critically ill patients:  140 - 180 mg/dL   Review of Glycemic Control Diabetes history: DM2 Outpatient Diabetes medications: Lantus 20 units QAM, Novolog 5 units TID with meals Current orders for Inpatient glycemic control: Lantus 20 units QHS, Novolog 0-15 units TID with meals, Novolog 0-5 units HS  Inpatient Diabetes Program Recommendations: Insulin - Basal: Please consider increasing Lantus to 25 untis QHS. Insulin - Meal Coverage: If steroids will be continued and patient is eating at least 50% of meals, please consider ordering Novolog 5 units TID with meals for meal coverage (in addition to Novolog correction scale).  Thanks, Barnie Alderman, RN, MSN, CDE Diabetes Coordinator Inpatient Diabetes Program [phone] (Team Pager from [place] to [place]) [phone] ([facility] office) [phone] [facility] office) [phone] [facility] office)

## 2015-10-06 NOTE — Progress Notes (Signed)
Pt agreeable to return to [facility] when medically stable- per MD anticipate 1-2 days.  Facility is able to accept pt Christmas Eve and Christmas Day if pt is medically stable at that time.  CSW will continue to follow.  Domenica Reamer, [place] Social Worker [phone]

## 2015-10-06 NOTE — Evaluation (Signed)
Physical Therapy Evaluation Patient Details Name: Heather Zimmerman. Cong MRN: [id number] DOB: 1946/08/14 Today's Date: 10/06/2015   History of Present Illness  Heather Zimmerman. Wickenhauser is a 69 y.o. female with history of morbid obesity, R BKA, diabetes mellitus, CHF has brought from the nursing home after patient was found to have persistent fever and headache since yesterday. admitted for acute encephalopathy with possible developing sepsis source not clear could be UTI  Clinical Impression  Pt admitted with above diagnosis. Pt currently with functional limitations due to the deficits listed below (see PT Problem List). Min A for bed mobility, pt sat on EOB x 8 minutes. Uses lift for bed to [facility] transfer at baseline. Plan is to return to SNF.  Pt will benefit from skilled PT to increase their independence and safety with mobility to allow discharge to the venue listed below.       Follow Up Recommendations SNF    Equipment Recommendations  None recommended by PT    Recommendations for Other Services OT consult     Precautions / Restrictions Precautions Precautions: Fall Restrictions Weight Bearing Restrictions: No      Mobility  Bed Mobility Overal bed mobility: Needs Assistance;+ 2 for safety/equipment Bed Mobility: Rolling;Supine to Sit;Sit to Supine Rolling: Min assist   Supine to sit: Min assist (increased time) Sit to supine: Min assist   General bed mobility comments: with increased time,use of rails (side and head) and LLE, bed flat,  pt able to get herself moved up in bed  Transfers                 General transfer comment: NT- pt uses [name] at baseline  Ambulation/Gait                Financial trader [employer] (Stroke Patients Only)       Balance Overall balance assessment: Needs assistance Sitting-balance support: Feet unsupported;Single extremity supported Sitting balance-Leahy Scale: Good Sitting balance -  Comments: pt sat on EOB x 8 minutes, steady without LOB                                     Pertinent Vitals/Pain Pain Assessment: 0-10 Pain Score: 1  Pain Location: R residual limb Pain Descriptors / Indicators: Aching Pain Intervention(s): Monitored during session;Repositioned    Home Living Family/patient expects to be discharged to:: Skilled nursing facility   Available Help at Discharge: [facility]                  Prior Function Level of Independence: Needs assistance   Gait / Transfers Assistance Needed: Pt reports that the staff at SNF used [place] to get her up from bed to W/C; BKA from 2013, pt reports she never had prosthesis due to her weight  ADL's / Homemaking Assistance Needed: Pt reports she could do her UBB/D sitting EOB and that staff mainly helped her with LBB/D        Hand Dominance   Dominant Hand: Left    Extremity/Trunk Assessment   Upper Extremity Assessment: RUE deficits/detail RUE Deficits / Details: 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         Lower Extremity Assessment: Overall WFL for tasks assessed (LLE knee extension 5/5)      Cervical / Trunk Assessment: Normal  Communication   Communication: Other (comment) (speech is difficult to understand--requires increased effort at times)  Cognition Arousal/Alertness: Awake/alert Behavior During Therapy: WFL for tasks assessed/performed Overall Cognitive Status: Within Functional Limits for tasks assessed                      General Comments      Exercises General Exercises - Lower Extremity Long Arc Quad: AROM;Left;10 reps;Seated      Assessment/Plan    PT Assessment Patient needs continued PT services  PT Diagnosis Generalized weakness   PT Problem List Decreased activity tolerance;Pain;Decreased mobility  PT Treatment Interventions Functional mobility training;Therapeutic activities;Therapeutic exercise   PT Goals (Current goals  can be found in the Care Plan section) Acute Rehab PT Goals Patient Stated Goal: to get more therapy (per her report she was no longer getting it at SNF); move to a SNF in [place] to be near sister PT Goal Formulation: With patient/family Time For Goal Achievement: 10/20/15 Potential to Achieve Goals: Good    Frequency Min 2X/week   Barriers to discharge        Co-evaluation PT/OT/SLP Co-Evaluation/Treatment: Yes Reason for Co-Treatment: Complexity of the patient's impairments (multi-system involvement);For patient/therapist safety PT goals addressed during session: Mobility/safety with mobility OT goals addressed during session: ADL's and self-care;Strengthening/ROM       End of Session Equipment Utilized During Treatment: Oxygen Activity Tolerance: Patient tolerated treatment well Patient left: in bed;with call bell/phone within reach Nurse Communication: Mobility status;Need for lift equipment         Time: 1152-1210 PT Time Calculation (min) (ACUTE ONLY): 18 min   Charges:   PT Evaluation $Initial PT Evaluation Tier I: 1 Procedure     PT G Codes:        [name] 10/06/2015, 12:32 PM [phone]

## 2015-10-06 NOTE — Progress Notes (Signed)
Pharmacy Antibiotic Follow-up Note  Heather Marren. Zimmerman is a 69 y.o. year-old female admitted on 10/03/2015.  The patient is currently on day 4 of vancomycin for cellulitis of LLE.  Assessment/Plan: - Continue Vancomycin 1750mg  IV q24h - Continue Ceftazidime 1g IV q8h - Will check vanc trough tomorrow morning - Monitor C&S, vitals and clinical progression  Temp (24hrs), Avg:98 F (36.7 C), Min:97.6 F (36.4 C), Max:98.6 F (37 C)   Recent Labs Lab 10/03/15 0149 10/03/15 0718 10/04/15 0328 10/05/15 0433  WBC 15.1* 18.6* 16.5* 17.2*    Recent Labs Lab 10/03/15 0149 10/03/15 0718 10/04/15 0328 10/05/15 0433 10/06/15 0423  CREATININE 1.69* 1.77* 1.81* 1.60* 1.36*   Estimated Creatinine Clearance: 57.1 mL/min (by C-G formula based on Cr of 1.36).    Allergies  Allergen Reactions  . Ace Inhibitors     unknown    Antimicrobials this admission: 12/20 Acyclovir>>12/21  12/20 Zosyn>>12/22 12/20 Vanc>>  12/22 Ceftazidime>>  Microbiology results: 12/20 BCx2>> NGTD  12/20 UCx>> multi sp. present MRSA PCR +  Thank you for allowing pharmacy to be a part of this patient's care.  Dimitri Ped, PharmD. PGY-1 Pharmacy Resident Pager: [phone] 10/06/2015 10:26 AM

## 2015-10-06 NOTE — Progress Notes (Signed)
Pt is on CPAP at this time tolerating it well.  

## 2015-10-06 NOTE — Progress Notes (Signed)
PROGRESS NOTE  Heather Zimmerman. Heather Zimmerman [id number] DOB: Oct 07, 1946 DOA: 10/03/2015 PCP: Gildardo Cranker, DO  Brief History 69 year old female with a history of diabetes mellitus, hypertension, diastolic CHF, chronic pain syndrome presented from a nursing home after the patient was found to have persistent fever and headache on the day prior to admission. Upon presentation, the patient was confused and lethargic with a fever of 104F. Because of concerns for sepsis, the patient was given antibiotics and 4 L of normal saline in the emergency department. Initial blood gas showed pH 7.26 with PCO2 78. The patient was placed on BiPAP and subsequently moved to the ICU secondary to hypercarbic respiratory failure. She was noted to have WBC 18.6 and procalcitonin peaked at 5.20. Notably, the patient was recently discharged from the hospital on 07/21/2015 after treatment for sepsis secondary to left lower extremity cellulitis. After transfer out of the ICU, the patient still. Fluid overload. Intravenous furosemide was restarted, and intravenous antibiotics were continued. Assessment/Plan: Acute on chronic hypercapnic and hypoxemic respiratory failure -secondary to CHF in setting of OSA/OHS -pt is approx 10-12 pound above her weight in June 2016 -ReStart IV furosemide -daily weights -pt normally on 2L [place]  Acute on chronic diastolic CHF -Restart furosemide 40 mg IV twice a day-->continue-->still with JVD and LE edema today -Daily weights -UO 5.1L with IV furosemide -pt was approx 10-12 pound above her weight in June 2016 (333) -Secondary to OHS/OSA and likely right-sided heart failure -06/21/2014 echo EF [id number], grade 1 diastolic dysfunction, mildly dilated RV -Repeat echocardiogram  Cellulitis left lower extremity/foot -Appreciate wound care consult  -Continue vancomycin D#4 -Discontinue Zosyn -Start ceftazidime D#2  OHS/OSA -continue supplemental oxygen -bronchial hygiene  Acute  encephalopathy -secondary to infectious process -improved -back to baseline  CKD stage III -Baseline creatinine 1.4-1.7 -monitor with diuresis -Serum creatinine improving with diuresis  Diabetes mellitus type 2 with nephropathy -CBGs elevated partly due to steroids -Wean steroids--last dose on 12/24 -Increase Lantus to 20 units at bedtime -Hemoglobin A1c--pending  Hypertension -The patient has had soft blood pressures, but question accuracy of readings -Monitor blood pressure with diuresis-->now improved   Family Communication: Pt at beside Disposition Plan: SNF in 1-2 days    Procedures/Studies: Ct Head Wo Contrast  10/03/2015  CLINICAL DATA:  Fever and headache. Evaluate for intracranial hemorrhage. EXAM: CT HEAD WITHOUT CONTRAST TECHNIQUE: Contiguous axial images were obtained from the base of the skull through the vertex without intravenous contrast. COMPARISON:  None. FINDINGS: Motion degraded study which could obscure pathology. Skull and Sinuses:No evidence of acute fracture or destructive process. Right maxillary sinus opacification and atelectasis compatible with chronic obstruction. Visualized orbits: Remote blowout fracture of the medial wall on the right. No acute finding. Brain: No evidence of acute infarction, hemorrhage, hydrocephalus, or mass lesion/mass effect. IMPRESSION: 1. No acute finding. 2. Moderate limitation due to motion. 3. Chronic right maxillary sinus obstruction. Electronically Signed   By: Monte Fantasia M.D.   On: 10/03/2015 03:27   US Renal  10/04/2015  CLINICAL DATA:  Acute renal failure, diabetes, hypertension EXAM: RENAL / URINARY TRACT ULTRASOUND COMPLETE COMPARISON:  None. FINDINGS: Right Kidney: Length: 12.7 cm. Echogenicity within normal limits. There is a lower pole cyst measures 3.7 x 3.5 cm. Left Kidney: Length: 10.9 cm in length. Limited visualization of the left kidney due to patient's large body habitus. No hydronephrosis. Bladder: A  Foley catheter is noted in decompressed urinary bladder. IMPRESSION: No hydronephrosis. There is  a cyst in lower pole of the right kidney measures 3.7 x 3.5 cm. Suboptimal visualization of the left kidney due to patient's large body habitus. Decompressed urinary bladder with Foley catheter. Electronically Signed   By: Lahoma Crocker M.D.   On: 10/04/2015 12:39   Dg Chest Port 1 View  10/05/2015  CLINICAL DATA:  69 year old female with shortness of breath, acute on chronic respiratory failure, sepsis. Initial encounter. EXAM: PORTABLE CHEST 1 VIEW COMPARISON:  10/03/2015 and earlier. FINDINGS: Portable AP semi upright view at 0626 hours. Continued low lung volumes. Stable cardiac size and mediastinal contours. Mildly increased perihilar interstitial opacity since 10/03/2015. No superimposed pneumothorax, pleural effusion, or consolidation. IMPRESSION: Continued low lung volumes. Mildly increased interstitial opacity since 10/03/2015, favor due to increased pulmonary vascularity. Electronically Signed   By: Genevie Ann M.D.   On: 10/05/2015 07:37   Dg Chest Port 1 View  10/03/2015  CLINICAL DATA:  Patient with respiratory failure, ventilator dependent. EXAM: PORTABLE CHEST 1 VIEW COMPARISON:  Chest radiograph 10/03/2015. FINDINGS: Monitoring leads overlie the patient. Stable enlarged cardiac and mediastinal contours. Low lung volumes. Unchanged pulmonary vascular redistribution and interstitial pulmonary edema. No large area of pulmonary consolidation or pleural effusion. IMPRESSION: Cardiomegaly with mild interstitial pulmonary edema, grossly unchanged from recent prior exam. Electronically Signed   By: Lovey Newcomer M.D.   On: 10/03/2015 07:15   Dg Chest Port 1 View  10/03/2015  CLINICAL DATA:  8-year-old female with sepsis and fever. EXAM: PORTABLE CHEST 1 VIEW COMPARISON:  Radiograph dated 07/18/2015 FINDINGS: Single portable view of the chest demonstrate cardiomegaly with central vascular prominence compatible  with congestive changes. There is no focal consolidation, pleural effusion, or pneumothorax. There is degenerative changes of the shoulders. No acute fracture. IMPRESSION: Cardiomegaly with congestive changes.  No focal consolidation. Electronically Signed   By: Anner Crete M.D.   On: 10/03/2015 02:24         Subjective:  patient states that she is breathing better. Denies any fevers, chills, chest pain, nausea, vomiting, diarrhea, abdominal pain. No dysuria or hematuria. No rashes.   Objective: Filed Vitals:   10/06/15 0750 10/06/15 0811 10/06/15 1343 10/06/15 1410  BP:  145/62 142/76   Pulse: 63 63 65 64  Temp:  98.6 F (37 C) 98.4 F (36.9 C)   TempSrc:  Oral Oral   Resp: 18 18 18 18   Height:      Weight:      SpO2: 92% 100% 98% 99%    Intake/Output Summary (Last 24 hours) at 10/06/15 1926 Last data filed at 10/06/15 1815  Gross per 24 hour  Intake    960 ml  Output   5675 ml  Net  -4715 ml   Weight change:  Exam:   General:  Pt is alert, follows commands appropriately, not in acute distress  HEENT: No icterus, No thrush, No neck mass, [place]/AT  Cardiovascular: RRR, S1/S2, no rubs, no gallops  Respiratory: bibasilar crackles. No wheezing   Abdomen: Soft/+BS, non tender, non distended, no guarding  Extremities: 2+ LE edema, No lymphangitis, No petechiae, No rashes, no synovitis  Data Reviewed: Basic Metabolic Panel:  Recent Labs Lab 10/03/15 0149 10/03/15 0718 10/04/15 0328 10/05/15 0433 10/06/15 0423  NA 143 143 144 141 140  K 4.3 4.3 4.0 4.0 4.2  CL 98* 103 104 99* 99*  CO2 36* 34* 32 32 30  GLUCOSE 199* 208* 240* 260* 382*  BUN 27* 26* 29* 31* 27*  CREATININE 1.69* 1.77*  1.81* 1.60* 1.36*  CALCIUM 9.0 8.3* 8.6* 8.6* 9.0  MG  --   --  2.1 2.1 2.1  PHOS  --   --  3.1 3.1 2.7   Liver Function Tests:  Recent Labs Lab 10/03/15 0149 10/03/15 0718  AST 22 23  ALT 18 17  ALKPHOS 97 80  BILITOT 0.8 1.0  PROT 7.7 6.6  ALBUMIN 2.7* 2.3*    No results for input(s): LIPASE, AMYLASE in the last 168 hours. No results for input(s): AMMONIA in the last 168 hours. CBC:  Recent Labs Lab 10/03/15 0149 10/03/15 0718 10/04/15 0328 10/05/15 0433  WBC 15.1* 18.6* 16.5* 17.2*  NEUTROABS 13.4* 17.4* 15.7*  --   HGB 12.8 10.3* 10.3* 10.1*  HCT 43.1 33.9* 34.2* 33.2*  MCV 92.7 92.1 92.9 91.5  PLT 188 188 166 180   Cardiac Enzymes:  Recent Labs Lab 10/03/15 0718 10/03/15 1225 10/03/15 1841  TROPONINI 0.05* 0.03 0.03   BNP: Invalid input(s): POCBNP CBG:  Recent Labs Lab 10/05/15 1540 10/05/15 2143 10/06/15 0546 10/06/15 1107 10/06/15 1729  GLUCAP 325* 314* 333* 321* 369*    Recent Results (from the past 240 hour(s))  Blood Culture (routine x 2)     Status: None (Preliminary result)   Collection Time: 10/03/15  1:48 AM  Result Value Ref Range Status   Specimen Description BLOOD LEFT ARM  Final   Special Requests AEROBIC BOTTLE ONLY 5ML  Final   Culture NO GROWTH 3 DAYS  Final   Report Status PENDING  Incomplete  Blood Culture (routine x 2)     Status: None (Preliminary result)   Collection Time: 10/03/15  2:00 AM  Result Value Ref Range Status   Specimen Description BLOOD RIGHT ARM  Final   Special Requests BOTTLES DRAWN AEROBIC AND ANAEROBIC 5ML  Final   Culture NO GROWTH 3 DAYS  Final   Report Status PENDING  Incomplete  Urine culture     Status: None   Collection Time: 10/03/15  2:35 AM  Result Value Ref Range Status   Specimen Description URINE, RANDOM  Final   Special Requests NONE  Final   Culture MULTIPLE SPECIES PRESENT, SUGGEST RECOLLECTION  Final   Report Status 10/04/2015 FINAL  Final  MRSA PCR Screening     Status: Abnormal   Collection Time: 10/03/15  6:00 AM  Result Value Ref Range Status   MRSA by PCR POSITIVE (A) NEGATIVE Final    Comment:        The GeneXpert MRSA Assay (FDA approved for NASAL specimens only), is one component of a comprehensive MRSA colonization surveillance  program. It is not intended to diagnose MRSA infection nor to guide or monitor treatment for MRSA infections. RESULT CALLED TO, READ BACK BY AND VERIFIED WITH: Ferrel Logan RN 10:15 10/03/15 ([email or web address])      Scheduled Meds: . antiseptic oral rinse  7 mL Mouth Rinse BID  . cefTAZidime (FORTAZ)  IV  1 g Intravenous 3 times per day  . Chlorhexidine Gluconate Cloth  6 each Topical Q0600  . furosemide  40 mg Intravenous Q12H  . gabapentin  300 mg Oral BID  . heparin subcutaneous  5,000 Units Subcutaneous 3 times per day  . insulin aspart  0-15 Units Subcutaneous TID WC  . insulin aspart  0-5 Units Subcutaneous QHS  . insulin glargine  20 Units Subcutaneous QHS  . ipratropium-albuterol  3 mL Nebulization Q6H  . mupirocin ointment  1 application Nasal BID  . [START ON  10/07/2015] predniSONE  20 mg Oral Once  . sodium chloride  3 mL Intravenous Q12H  . vancomycin  1,750 mg Intravenous Q24H   Continuous Infusions:    Jani Ploeger, DO  [facility] Pager [phone]  If 7PM-7AM, please contact night-coverage [email or web address] Password [email or web address] 10/06/2015, 7:26 PM   LOS: 3 days

## 2015-10-06 NOTE — Progress Notes (Deleted)
PT Cancellation Note  Patient Details Name: Heather Zimmerman. Lotz MRN: [id number] DOB: November 25, 1945   Cancelled Treatment:    Reason Eval/Treat Not Completed: PT screened, no needs identified, will sign off (pt is at baseline of dependence for mobility, she reports that a lift is used at SNF to transfer her to [facility]. REcommend up with lift with nursing. )   [name] 10/06/2015, 9:06 AM [phone]

## 2015-10-07 LAB — BASIC METABOLIC PANEL
Anion gap: 10 (ref 5–15)
BUN: 28 mg/dL — ABNORMAL HIGH (ref 6–20)
CALCIUM: 8.8 mg/dL — AB (ref 8.9–10.3)
CO2: 32 mmol/L (ref 22–32)
CREATININE: 1.37 mg/dL — AB (ref 0.44–1.00)
Chloride: 95 mmol/L — ABNORMAL LOW (ref 101–111)
GFR calc non Af Amer: 38 mL/min — ABNORMAL LOW (ref >60)
GFR, EST AFRICAN AMERICAN: 44 mL/min — AB (ref >60)
Glucose, Bld: 380 mg/dL — ABNORMAL HIGH (ref 65–99)
Potassium: 3.9 mmol/L (ref 3.5–5.1)
SODIUM: 137 mmol/L (ref 135–145)

## 2015-10-07 LAB — GLUCOSE, CAPILLARY
GLUCOSE-CAPILLARY: 288 mg/dL — AB (ref 65–99)
GLUCOSE-CAPILLARY: 363 mg/dL — AB (ref 65–99)
Glucose-Capillary: 235 mg/dL — ABNORMAL HIGH (ref 65–99)
Glucose-Capillary: 267 mg/dL — ABNORMAL HIGH (ref 65–99)

## 2015-10-07 LAB — CBC
HCT: 36.8 % (ref 36.0–46.0)
Hemoglobin: 11.2 g/dL — ABNORMAL LOW (ref 12.0–15.0)
MCH: 27.4 pg (ref 26.0–34.0)
MCHC: 30.4 g/dL (ref 30.0–36.0)
MCV: 90 fL (ref 78.0–100.0)
PLATELETS: 193 10*3/uL (ref 150–400)
RBC: 4.09 MIL/uL (ref 3.87–5.11)
RDW: 15.4 % (ref 11.5–15.5)
WBC: 7.4 10*3/uL (ref 4.0–10.5)

## 2015-10-07 LAB — HEMOGLOBIN A1C
HEMOGLOBIN A1C: 7.3 % — AB (ref 4.8–5.6)
MEAN PLASMA GLUCOSE: 163 mg/dL

## 2015-10-07 LAB — VANCOMYCIN, TROUGH: Vancomycin Tr: 21 ug/mL — ABNORMAL HIGH (ref 10.0–20.0)

## 2015-10-07 LAB — PHOSPHORUS: Phosphorus: 3.1 mg/dL (ref 2.5–4.6)

## 2015-10-07 LAB — PROCALCITONIN: PROCALCITONIN: 0.37 ng/mL

## 2015-10-07 LAB — MAGNESIUM: Magnesium: 2 mg/dL (ref 1.7–2.4)

## 2015-10-07 MED ORDER — VANCOMYCIN HCL 10 G IV SOLR
1500.0000 mg | INTRAVENOUS | Status: DC
Start: 2015-10-07 — End: 2015-10-10
  Administered 2015-10-07 – 2015-10-10 (×4): 1500 mg via INTRAVENOUS
  Filled 2015-10-07 (×4): qty 1500

## 2015-10-07 MED ORDER — ONDANSETRON HCL 4 MG/2ML IJ SOLN
4.0000 mg | Freq: Four times a day (QID) | INTRAMUSCULAR | Status: DC | PRN
  Administered 2015-10-07 – 2015-10-09 (×2): 4 mg via INTRAVENOUS
  Filled 2015-10-07 (×2): qty 2

## 2015-10-07 MED ORDER — INSULIN GLARGINE 100 UNIT/ML ~~LOC~~ SOLN
26.0000 [IU] | Freq: Every day | SUBCUTANEOUS | Status: DC
  Administered 2015-10-07 – 2015-10-09 (×3): 26 [IU] via SUBCUTANEOUS
  Filled 2015-10-07 (×4): qty 0.26

## 2015-10-07 NOTE — Progress Notes (Signed)
PROGRESS NOTE  Heather Zimmerman. [name] [id number] DOB: October 22, 1945 DOA: 10/03/2015 PCP: Gildardo Cranker, DO  HPI/Recap of past 59 hours: 69 year old female with a history of diabetes mellitus, hypertension, diastolic CHF, chronic pain syndrome presented from a nursing home after the patient was found to have persistent fever and headache on the day prior to admission. Upon presentation, the patient was confused and lethargic with a fever of 104F. Because of concerns for sepsis, the patient was given antibiotics and 4 L of normal saline in the emergency department. Initial blood gas showed pH 7.26 with PCO2 78. The patient was placed on BiPAP and subsequently moved to the ICU secondary to hypercarbic respiratory failure. She was noted to have WBC 18.6 and procalcitonin peaked at 5.20. Notably, the patient was recently discharged from the hospital on 07/21/2015 after treatment for sepsis secondary to left lower extremity cellulitis. After transfer out of the ICU, the patient still. Fluid overload. Intravenous furosemide was restarted, and intravenous antibiotics were continued.   So far, patient has diuresed almost 6 L. Breathing a little bit easier. She states her stomach is not feeling well, otherwise no complaints.  Assessment/Plan: Principal Problem:   Acute on chronic respiratory failure with hypercapnia (HCC) and hypoxemia secondary to acute on chronic diastolic heart failure and obstructive sleep apnea:  Acute on chronic diastolic CHF (congestive heart failure) ([place]): Aggressively diuresing. Down 6 L and unclear on accuracy of weights, although down to 319 pounds today. Continue oxygen plus nightly CPAP Active Problems:   Acute encephalopathy: Secondary to infection. Resolved.    Acute kidney injury in the setting of CKD (chronic kidney disease) stage 3, GFR 30-59 ml/min colon secondary to infection and hypotension. Now back to baseline.    DM (diabetes mellitus), type 2 with renal  complications [facility]): Still with significant hyperglycemia.  Titrating up Lantus.     Cellulitis of foot, left: Seen by wound care. On ceftaz and vancomycin  Code Status: Full code   Family Communication: Left message for family   Disposition Plan: Skilled nursing facility beginning of next week    Consultants:  Critical care   Procedures:  None   Antibiotics:  IV vancomycin 12/20-present  IV Zosyn 12/20-12/23   IV ceftaz 12/23-present   Objective: BP 120/62 mmHg  Pulse 67  Temp(Src) 97.6 F (36.4 C) (Oral)  Resp 18  Ht 5\' 4"  (1.626 m)  Wt 144.697 kg (319 lb)  BMI 54.73 kg/m2  SpO2 100%  Intake/Output Summary (Last 24 hours) at 10/07/15 1433 Last data filed at 10/07/15 1300  Gross per 24 hour  Intake    530 ml  Output   5350 ml  Net  -4820 ml   Filed Weights   10/04/15 0413 10/05/15 0646 10/07/15 0218  Weight: 156.3 kg (344 lb 9.3 oz) 152.59 kg (336 lb 6.4 oz) 144.697 kg (319 lb)    Exam:   General:  Alert and oriented 2, no acute distress   Cardiovascular: Regular rate and rhythm, Q000111Q, soft 2/6 systolic ejection murmur   Respiratory: Decreased breath sounds throughout secondary to body habitus   Abdomen: Soft, obese, nontender, hypoactive bowel sounds   Musculoskeletal: Status post right BKA, left leg 2-3+ pitting edema with leg wrapped    Data Reviewed: Basic Metabolic Panel:  Recent Labs Lab 10/03/15 0149 10/03/15 0718 10/04/15 0328 10/05/15 0433 10/06/15 0423 10/07/15 0331  NA 143 143 144 141 140  --   K 4.3 4.3 4.0 4.0 4.2  --   CL 98*  103 104 99* 99*  --   CO2 36* 34* 32 32 30  --   GLUCOSE 199* 208* 240* 260* 382*  --   BUN 27* 26* 29* 31* 27*  --   CREATININE 1.69* 1.77* 1.81* 1.60* 1.36*  --   CALCIUM 9.0 8.3* 8.6* 8.6* 9.0  --   MG  --   --  2.1 2.1 2.1 2.0  PHOS  --   --  3.1 3.1 2.7 3.1   Liver Function Tests:  Recent Labs Lab 10/03/15 0149 10/03/15 0718  AST 22 23  ALT 18 17  ALKPHOS 97 80  BILITOT 0.8 1.0    PROT 7.7 6.6  ALBUMIN 2.7* 2.3*   No results for input(s): LIPASE, AMYLASE in the last 168 hours. No results for input(s): AMMONIA in the last 168 hours. CBC:  Recent Labs Lab 10/03/15 0149 10/03/15 0718 10/04/15 0328 10/05/15 0433  WBC 15.1* 18.6* 16.5* 17.2*  NEUTROABS 13.4* 17.4* 15.7*  --   HGB 12.8 10.3* 10.3* 10.1*  HCT 43.1 33.9* 34.2* 33.2*  MCV 92.7 92.1 92.9 91.5  PLT 188 188 166 180   Cardiac Enzymes:    Recent Labs Lab 10/03/15 0718 10/03/15 1225 10/03/15 1841  TROPONINI 0.05* 0.03 0.03   BNP (last 3 results) No results for input(s): BNP in the last 8760 hours.  ProBNP (last 3 results) No results for input(s): PROBNP in the last 8760 hours.  CBG:  Recent Labs Lab 10/06/15 1107 10/06/15 1729 10/06/15 2119 10/07/15 0551 10/07/15 1215  GLUCAP 321* 369* 325* 235* 288*    Recent Results (from the past 240 hour(s))  Blood Culture (routine x 2)     Status: None (Preliminary result)   Collection Time: 10/03/15  1:48 AM  Result Value Ref Range Status   Specimen Description BLOOD LEFT ARM  Final   Special Requests AEROBIC BOTTLE ONLY 5ML  Final   Culture NO GROWTH 4 DAYS  Final   Report Status PENDING  Incomplete  Blood Culture (routine x 2)     Status: None (Preliminary result)   Collection Time: 10/03/15  2:00 AM  Result Value Ref Range Status   Specimen Description BLOOD RIGHT ARM  Final   Special Requests BOTTLES DRAWN AEROBIC AND ANAEROBIC 5ML  Final   Culture NO GROWTH 4 DAYS  Final   Report Status PENDING  Incomplete  Urine culture     Status: None   Collection Time: 10/03/15  2:35 AM  Result Value Ref Range Status   Specimen Description URINE, RANDOM  Final   Special Requests NONE  Final   Culture MULTIPLE SPECIES PRESENT, SUGGEST RECOLLECTION  Final   Report Status 10/04/2015 FINAL  Final  MRSA PCR Screening     Status: Abnormal   Collection Time: 10/03/15  6:00 AM  Result Value Ref Range Status   MRSA by PCR POSITIVE (A) NEGATIVE  Final    Comment:        The GeneXpert MRSA Assay (FDA approved for NASAL specimens only), is one component of a comprehensive MRSA colonization surveillance program. It is not intended to diagnose MRSA infection nor to guide or monitor treatment for MRSA infections. RESULT CALLED TO, READ BACK BY AND VERIFIED WITH: Ferrel Logan RN 10:15 10/03/15 ([email or web address])      Studies: No results found.  Scheduled Meds: . antiseptic oral rinse  7 mL Mouth Rinse BID  . cefTAZidime (FORTAZ)  IV  1 g Intravenous 3 times per day  . furosemide  40 mg Intravenous Q12H  . gabapentin  300 mg Oral BID  . heparin subcutaneous  5,000 Units Subcutaneous 3 times per day  . insulin aspart  0-15 Units Subcutaneous TID WC  . insulin aspart  0-5 Units Subcutaneous QHS  . insulin glargine  26 Units Subcutaneous QHS  . ipratropium-albuterol  3 mL Nebulization QID  . mupirocin ointment  1 application Nasal BID  . sodium chloride  3 mL Intravenous Q12H  . vancomycin  1,500 mg Intravenous Q24H    Continuous Infusions:    Time spent: 25 minutes   [place] [facility] Pager [phone] . If 7PM-7AM, please contact night-coverage at [email or web address], password [facility] 10/07/2015, 2:33 PM  LOS: 4 days

## 2015-10-07 NOTE — Progress Notes (Addendum)
Pharmacy Antibiotic Follow-up Note  Heather Zimmerman is a 69 y.o. year-old female admitted on 10/03/2015.  The patient is currently on day 5 of Vancomycin and ceftazidime for Cellulitis  Assessment: Vancomycin trough is slightly elevated this AM  Plan: -Decrease vancomycin to 1500 mg IV q24h -Re-check trough as indicated  -continue ceftazidime 1g IV q8h  Temp (24hrs), Avg:98.4 F (36.9 C), Min:98.1 F (36.7 C), Max:98.6 F (37 C)   Recent Labs Lab 10/03/15 0149 10/03/15 0718 10/04/15 0328 10/05/15 0433  WBC 15.1* 18.6* 16.5* 17.2*    Recent Labs Lab 10/03/15 0149 10/03/15 0718 10/04/15 0328 10/05/15 0433 10/06/15 0423  CREATININE 1.69* 1.77* 1.81* 1.60* 1.36*   Estimated Creatinine Clearance: 55.1 mL/min (by C-G formula based on Cr of 1.36).    Allergies  Allergen Reactions  . Ace Inhibitors     unknown   Antimicrobials this admission: 12/20 Acyclovir>>12/21  12/20 Zosyn>>12/22 12/20 Vanc>>  12/22 Ceftazidime>>  Levels/Dose changes this admission: 12/24 VT = 21 > decreased to 1500mg  IV q24h  Microbiology results: 12/20 BCx2>> NGTD  12/20 UCx>> multi sp. present MRSA PCR +  Thank you for allowing pharmacy to be a part of this patient's care.  Narda Bonds PharmD 10/07/2015 4:40 AM Ander Purpura D. Symone Cornman, PharmD, BCPS Clinical Pharmacist Pager: [phone] 10/07/2015 9:48 AM

## 2015-10-08 LAB — CBC
HEMATOCRIT: 39.4 % (ref 36.0–46.0)
Hemoglobin: 11.6 g/dL — ABNORMAL LOW (ref 12.0–15.0)
MCH: 26.4 pg (ref 26.0–34.0)
MCHC: 29.4 g/dL — AB (ref 30.0–36.0)
MCV: 89.7 fL (ref 78.0–100.0)
PLATELETS: 214 10*3/uL (ref 150–400)
RBC: 4.39 MIL/uL (ref 3.87–5.11)
RDW: 15.3 % (ref 11.5–15.5)
WBC: 9 10*3/uL (ref 4.0–10.5)

## 2015-10-08 LAB — CULTURE, BLOOD (ROUTINE X 2)
CULTURE: NO GROWTH
Culture: NO GROWTH

## 2015-10-08 LAB — GLUCOSE, CAPILLARY
Glucose-Capillary: 165 mg/dL — ABNORMAL HIGH (ref 65–99)
Glucose-Capillary: 174 mg/dL — ABNORMAL HIGH (ref 65–99)
Glucose-Capillary: 334 mg/dL — ABNORMAL HIGH (ref 65–99)
Glucose-Capillary: 224 mg/dL — ABNORMAL HIGH (ref 65–99)

## 2015-10-08 LAB — BASIC METABOLIC PANEL
Anion gap: 8 (ref 5–15)
BUN: 26 mg/dL — AB (ref 6–20)
CO2: 36 mmol/L — ABNORMAL HIGH (ref 22–32)
CREATININE: 1.26 mg/dL — AB (ref 0.44–1.00)
Calcium: 8.8 mg/dL — ABNORMAL LOW (ref 8.9–10.3)
Chloride: 96 mmol/L — ABNORMAL LOW (ref 101–111)
GFR calc Af Amer: 49 mL/min — ABNORMAL LOW (ref >60)
GFR, EST NON AFRICAN AMERICAN: 42 mL/min — AB (ref >60)
GLUCOSE: 208 mg/dL — AB (ref 65–99)
POTASSIUM: 4.9 mmol/L (ref 3.5–5.1)
SODIUM: 140 mmol/L (ref 135–145)

## 2015-10-08 MED ORDER — DIPHENHYDRAMINE HCL 25 MG PO CAPS
25.0000 mg | ORAL_CAPSULE | Freq: Once | ORAL | Status: AC
  Administered 2015-10-08: 25 mg via ORAL
  Filled 2015-10-08: qty 1

## 2015-10-08 NOTE — Progress Notes (Signed)
PROGRESS NOTE  Heather Zimmerman. Heather Zimmerman [id number] DOB: 08/29/1946 DOA: 10/03/2015 PCP: Gildardo Cranker, DO  HPI/Recap of past 108 hours: 69 year old female with a history of diabetes mellitus, hypertension, diastolic CHF, chronic pain syndrome presented from a nursing home after the patient was found to have persistent fever and headache on the day prior to admission. Upon presentation, the patient was confused and lethargic with a fever of 104F. Because of concerns for sepsis, the patient was given antibiotics and 4 L of normal saline in the emergency department. Initial blood gas showed pH 7.26 with PCO2 78. The patient was placed on BiPAP and subsequently moved to the ICU secondary to hypercarbic respiratory failure. She was noted to have WBC 18.6 and procalcitonin peaked at 5.20. Notably, the patient was recently discharged from the hospital on 07/21/2015 after treatment for sepsis secondary to left lower extremity cellulitis. After transfer out of the ICU, the patient still. Fluid overload. Intravenous furosemide was restarted, and intravenous antibiotics were continued.   So far, patient has diuresed over 8 L. Breathing continues to improve. Patient had upset stomach last night, but doing okay today. Tolerating by mouth   Assessment/Plan: Principal Problem:   Acute on chronic respiratory failure with hypercapnia (HCC) and hypoxemia secondary to acute on chronic diastolic heart failure and obstructive sleep apnea:  Acute on chronic diastolic CHF (congestive heart failure) ([place]): Aggressively diuresing. Down 8 L and unclear on accuracy of weights, although down to 318 pounds today. Continue oxygen plus nightly CPAP Active Problems:   Acute encephalopathy: Secondary to infection. Resolved.    Acute kidney injury in the setting of CKD (chronic kidney disease) stage 3, GFR 30-59 ml/min colon secondary to infection and hypotension. Now back to baseline.    DM (diabetes mellitus), type 2 with renal  complications [facility]): Still with significant hyperglycemia.  Blood sugars betterafter titrating up Lantus on 12/24     Cellulitis of foot, left: Seen by wound care. On ceftaz and vancomycin  Code Status: Full code   Family Communication: Left message for family   Disposition Plan: Skilled nursing facility once fully diuresed   Consultants:  Critical care   Procedures:  None   Antibiotics:  IV vancomycin 12/20-present  IV Zosyn 12/20-12/23   IV ceftaz 12/23-present   Objective: BP 153/86 mmHg  Pulse 67  Temp(Src) 98 F (36.7 C) (Oral)  Resp 20  Ht 5\' 4"  (1.626 m)  Wt 144.244 kg (318 lb)  BMI 54.56 kg/m2  SpO2 97%  Intake/Output Summary (Last 24 hours) at 10/08/15 1447 Last data filed at 10/08/15 0754  Gross per 24 hour  Intake   1630 ml  Output   3750 ml  Net  -2120 ml   Filed Weights   10/05/15 0646 10/07/15 0218 10/08/15 0528  Weight: 152.59 kg (336 lb 6.4 oz) 144.697 kg (319 lb) 144.244 kg (318 lb)    Exam: Little change from previous day  General:  Alert and oriented 2, no acute distress   Cardiovascular: Regular rate and rhythm, Q000111Q, soft 2/6 systolic ejection murmur   Respiratory: Decreased breath sounds throughout secondary to body habitus   Abdomen: Soft, obese, nontender, hypoactive bowel sounds   Musculoskeletal: Status post right BKA, left leg 2-3+ pitting edema with leg wrapped    Data Reviewed: Basic Metabolic Panel:  Recent Labs Lab 10/04/15 0328 10/05/15 0433 10/06/15 0423 10/07/15 0331 10/07/15 1455 10/08/15 0434  NA 144 141 140  --  137 140  K 4.0 4.0 4.2  --  3.9 4.9  CL 104 99* 99*  --  95* 96*  CO2 32 32 30  --  32 36*  GLUCOSE 240* 260* 382*  --  380* 208*  BUN 29* 31* 27*  --  28* 26*  CREATININE 1.81* 1.60* 1.36*  --  1.37* 1.26*  CALCIUM 8.6* 8.6* 9.0  --  8.8* 8.8*  MG 2.1 2.1 2.1 2.0  --   --   PHOS 3.1 3.1 2.7 3.1  --   --    Liver Function Tests:  Recent Labs Lab 10/03/15 0149 10/03/15 0718  AST  22 23  ALT 18 17  ALKPHOS 97 80  BILITOT 0.8 1.0  PROT 7.7 6.6  ALBUMIN 2.7* 2.3*   No results for input(s): LIPASE, AMYLASE in the last 168 hours. No results for input(s): AMMONIA in the last 168 hours. CBC:  Recent Labs Lab 10/03/15 0149 10/03/15 0718 10/04/15 0328 10/05/15 0433 10/07/15 1455 10/08/15 0434  WBC 15.1* 18.6* 16.5* 17.2* 7.4 9.0  NEUTROABS 13.4* 17.4* 15.7*  --   --   --   HGB 12.8 10.3* 10.3* 10.1* 11.2* 11.6*  HCT 43.1 33.9* 34.2* 33.2* 36.8 39.4  MCV 92.7 92.1 92.9 91.5 90.0 89.7  PLT 188 188 166 180 193 214   Cardiac Enzymes:    Recent Labs Lab 10/03/15 0718 10/03/15 1225 10/03/15 1841  TROPONINI 0.05* 0.03 0.03   BNP (last 3 results) No results for input(s): BNP in the last 8760 hours.  ProBNP (last 3 results) No results for input(s): PROBNP in the last 8760 hours.  CBG:  Recent Labs Lab 10/07/15 1215 10/07/15 1605 10/07/15 2130 10/08/15 0555 10/08/15 1156  GLUCAP 288* 363* 267* 165* 174*    Recent Results (from the past 240 hour(s))  Blood Culture (routine x 2)     Status: None (Preliminary result)   Collection Time: 10/03/15  1:48 AM  Result Value Ref Range Status   Specimen Description BLOOD LEFT ARM  Final   Special Requests AEROBIC BOTTLE ONLY 5ML  Final   Culture NO GROWTH 4 DAYS  Final   Report Status PENDING  Incomplete  Blood Culture (routine x 2)     Status: None (Preliminary result)   Collection Time: 10/03/15  2:00 AM  Result Value Ref Range Status   Specimen Description BLOOD RIGHT ARM  Final   Special Requests BOTTLES DRAWN AEROBIC AND ANAEROBIC 5ML  Final   Culture NO GROWTH 4 DAYS  Final   Report Status PENDING  Incomplete  Urine culture     Status: None   Collection Time: 10/03/15  2:35 AM  Result Value Ref Range Status   Specimen Description URINE, RANDOM  Final   Special Requests NONE  Final   Culture MULTIPLE SPECIES PRESENT, SUGGEST RECOLLECTION  Final   Report Status 10/04/2015 FINAL  Final  MRSA PCR  Screening     Status: Abnormal   Collection Time: 10/03/15  6:00 AM  Result Value Ref Range Status   MRSA by PCR POSITIVE (A) NEGATIVE Final    Comment:        The GeneXpert MRSA Assay (FDA approved for NASAL specimens only), is one component of a comprehensive MRSA colonization surveillance program. It is not intended to diagnose MRSA infection nor to guide or monitor treatment for MRSA infections. RESULT CALLED TO, READ BACK BY AND VERIFIED WITH: Ferrel Logan RN 10:15 10/03/15 ([email or web address])      Studies: No results found.  Scheduled Meds: . antiseptic oral rinse  7 mL Mouth Rinse BID  . cefTAZidime (FORTAZ)  IV  1 g Intravenous 3 times per day  . furosemide  40 mg Intravenous Q12H  . gabapentin  300 mg Oral BID  . heparin subcutaneous  5,000 Units Subcutaneous 3 times per day  . insulin aspart  0-15 Units Subcutaneous TID WC  . insulin aspart  0-5 Units Subcutaneous QHS  . insulin glargine  26 Units Subcutaneous QHS  . ipratropium-albuterol  3 mL Nebulization QID  . sodium chloride  3 mL Intravenous Q12H  . vancomycin  1,500 mg Intravenous Q24H    Continuous Infusions:    Time spent: 15 minutes   [place] [facility] Pager [phone] . If 7PM-7AM, please contact night-coverage at [email or web address], password [email or web address] 10/08/2015, 2:47 PM  LOS: 5 days

## 2015-10-08 NOTE — Progress Notes (Signed)
Attempted to remove foley, pt states the MD wants her to have a foley and we may be able to remove it in the morning.

## 2015-10-09 ENCOUNTER — Encounter: Payer: Self-pay | Admitting: Adult Health

## 2015-10-09 DIAGNOSIS — I5033 Acute on chronic diastolic (congestive) heart failure: Secondary | ICD-10-CM

## 2015-10-09 DIAGNOSIS — L03116 Cellulitis of left lower limb: Secondary | ICD-10-CM

## 2015-10-09 LAB — BASIC METABOLIC PANEL
ANION GAP: 9 (ref 5–15)
BUN: 22 mg/dL — AB (ref 6–20)
CHLORIDE: 97 mmol/L — AB (ref 101–111)
CO2: 37 mmol/L — AB (ref 22–32)
Calcium: 9.2 mg/dL (ref 8.9–10.3)
Creatinine, Ser: 1.41 mg/dL — ABNORMAL HIGH (ref 0.44–1.00)
GFR calc Af Amer: 43 mL/min — ABNORMAL LOW (ref >60)
GFR calc non Af Amer: 37 mL/min — ABNORMAL LOW (ref >60)
GLUCOSE: 124 mg/dL — AB (ref 65–99)
POTASSIUM: 3.5 mmol/L (ref 3.5–5.1)
Sodium: 143 mmol/L (ref 135–145)

## 2015-10-09 LAB — GLUCOSE, CAPILLARY
GLUCOSE-CAPILLARY: 173 mg/dL — AB (ref 65–99)
GLUCOSE-CAPILLARY: 216 mg/dL — AB (ref 65–99)
Glucose-Capillary: 127 mg/dL — ABNORMAL HIGH (ref 65–99)
Glucose-Capillary: 273 mg/dL — ABNORMAL HIGH (ref 65–99)

## 2015-10-09 LAB — PROCALCITONIN: Procalcitonin: 0.19 ng/mL

## 2015-10-09 MED ORDER — FUROSEMIDE 40 MG PO TABS
40.0000 mg | ORAL_TABLET | Freq: Two times a day (BID) | ORAL | Status: DC
  Administered 2015-10-09 – 2015-10-10 (×2): 40 mg via ORAL
  Filled 2015-10-09 (×2): qty 1

## 2015-10-09 MED ORDER — POTASSIUM CHLORIDE CRYS ER 20 MEQ PO TBCR
40.0000 meq | EXTENDED_RELEASE_TABLET | Freq: Once | ORAL | Status: AC
  Administered 2015-10-09: 40 meq via ORAL
  Filled 2015-10-09: qty 2

## 2015-10-09 NOTE — Progress Notes (Signed)
[facility] PROGRESS NOTE  Heather Zimmerman. [name] [id number] DOB: Aug 04, 1946 DOA: 10/03/2015 PCP: Gildardo Cranker, DO  Brief interval history 69 year old female with a history of diabetes mellitus, hypertension, diastolic CHF, chronic pain syndrome presented from a nursing home after the patient was found to have persistent fever and headache on the day prior to admission. Upon presentation, the patient was confused and lethargic with a fever of 104F. Because of concerns for sepsis, the patient was given antibiotics and 4 L of normal saline in the emergency department. Initial blood gas showed pH 7.26 with PCO2 78. The patient was placed on BiPAP and subsequently moved to the ICU secondary to hypercarbic respiratory failure. She was noted to have WBC 18.6 and procalcitonin peaked at 5.20. Notably, the patient was recently discharged from the hospital on 07/21/2015 after treatment for sepsis secondary to left lower extremity cellulitis. After transfer out of the ICU, the patient still. Fluid overload. Intravenous furosemide was restarted, and intravenous antibiotics were continued.    Assessment/Plan: #1 acute on chronic respiratory failure with hypercarbia and hypoxemia secondary to acute on chronic diastolic heart failure and obstructive sleep apnea Patient with clinical improvement. Patient is -10 L during this hospitalization and -2.2 L over the past 24 hours. Current weight is 310 pounds. Patient sats 95% on 2 L nasal cannula. Change IV Lasix to oral Lasix. Strict I's and O's. Daily weights. Continue CPAP QHS.  #2 acute on chronic kidney disease stage III Likely secondary to prerenal azotemia secondary to volume overload. Improved with diuresis. Currently at baseline. Follow.  #3 acute encephalopathy Likely secondary to cellulitis. Resolved. At baseline.  #4 left lower extremity cellulitis Patient has been seen by wound care. Continue empiric IV vancomycin and Ceptaz. Will switch to oral  antibiotics on discharge.  #5 diabetes mellitus type 2 Hemoglobin A1c 7.3 on 10/06/2015. CBGs have ranged from 127-173. Continue current dose of Lantus. Sliding scale insulin. Code Status: Full Family Communication: Updated patient. No family at bedside. Disposition Plan: Back to nursing facility hopefully tomorrow.   Consultants:  PCCM: Dr. Chase Caller 10/03/2015/Dr. Titus Mould 10/04/2015  Procedures:  CT head 10/03/2015  Chest x-ray 10/05/2015, 10/03/2015  Renal ultrasound 10/04/2015  Antibiotics:  IV vancomycin 12/20-present  IV Zosyn 12/20-12/23  IV ceftaz 12/23-present  HPI/Subjective: Patient states shortness of breath improved. No complaints.  Objective: Filed Vitals:   10/09/15 0646 10/09/15 1102  BP: 125/71 121/70  Pulse: 71 67  Temp: 98.5 F (36.9 C) 97.5 F (36.4 C)  Resp: 18 18    Intake/Output Summary (Last 24 hours) at 10/09/15 1115 Last data filed at 10/09/15 0915  Gross per 24 hour  Intake   1730 ml  Output   3925 ml  Net  -2195 ml   Filed Weights   10/07/15 0218 10/08/15 0528 10/09/15 0646  Weight: 144.697 kg (319 lb) 144.244 kg (318 lb) 140.615 kg (310 lb)    Exam:   General:  NAD  Cardiovascular: RRR  Respiratory: CTAB anterior lung fields.  Abdomen: Obese, soft, nontender, nondistended, positive bowel sounds.  Musculoskeletal: No clubbing no cyanosis, trace- 1+ LLE edema. Status post right BKA.  Data Reviewed: Basic Metabolic Panel:  Recent Labs Lab 10/04/15 0328 10/05/15 0433 10/06/15 0423 10/07/15 0331 10/07/15 1455 10/08/15 0434 10/09/15 0441  NA 144 141 140  --  137 140 143  K 4.0 4.0 4.2  --  3.9 4.9 3.5  CL 104 99* 99*  --  95* 96* 97*  CO2 32 32 30  --  32 36* 37*  GLUCOSE 240* 260* 382*  --  380* 208* 124*  BUN 29* 31* 27*  --  28* 26* 22*  CREATININE 1.81* 1.60* 1.36*  --  1.37* 1.26* 1.41*  CALCIUM 8.6* 8.6* 9.0  --  8.8* 8.8* 9.2  MG 2.1 2.1 2.1 2.0  --   --   --   PHOS 3.1 3.1 2.7 3.1  --   --   --     Liver Function Tests:  Recent Labs Lab 10/03/15 0149 10/03/15 0718  AST 22 23  ALT 18 17  ALKPHOS 97 80  BILITOT 0.8 1.0  PROT 7.7 6.6  ALBUMIN 2.7* 2.3*   No results for input(s): LIPASE, AMYLASE in the last 168 hours. No results for input(s): AMMONIA in the last 168 hours. CBC:  Recent Labs Lab 10/03/15 0149 10/03/15 0718 10/04/15 0328 10/05/15 0433 10/07/15 1455 10/08/15 0434  WBC 15.1* 18.6* 16.5* 17.2* 7.4 9.0  NEUTROABS 13.4* 17.4* 15.7*  --   --   --   HGB 12.8 10.3* 10.3* 10.1* 11.2* 11.6*  HCT 43.1 33.9* 34.2* 33.2* 36.8 39.4  MCV 92.7 92.1 92.9 91.5 90.0 89.7  PLT 188 188 166 180 193 214   Cardiac Enzymes:  Recent Labs Lab 10/03/15 0718 10/03/15 1225 10/03/15 1841  TROPONINI 0.05* 0.03 0.03   BNP (last 3 results) No results for input(s): BNP in the last 8760 hours.  ProBNP (last 3 results) No results for input(s): PROBNP in the last 8760 hours.  CBG:  Recent Labs Lab 10/08/15 0555 10/08/15 1156 10/08/15 1638 10/08/15 2145 10/09/15 0554  GLUCAP 165* 174* 334* 224* 127*    Recent Results (from the past 240 hour(s))  Blood Culture (routine x 2)     Status: None   Collection Time: 10/03/15  1:48 AM  Result Value Ref Range Status   Specimen Description BLOOD LEFT ARM  Final   Special Requests AEROBIC BOTTLE ONLY 5ML  Final   Culture NO GROWTH 5 DAYS  Final   Report Status 10/08/2015 FINAL  Final  Blood Culture (routine x 2)     Status: None   Collection Time: 10/03/15  2:00 AM  Result Value Ref Range Status   Specimen Description BLOOD RIGHT ARM  Final   Special Requests BOTTLES DRAWN AEROBIC AND ANAEROBIC 5ML  Final   Culture NO GROWTH 5 DAYS  Final   Report Status 10/08/2015 FINAL  Final  Urine culture     Status: None   Collection Time: 10/03/15  2:35 AM  Result Value Ref Range Status   Specimen Description URINE, RANDOM  Final   Special Requests NONE  Final   Culture MULTIPLE SPECIES PRESENT, SUGGEST RECOLLECTION  Final    Report Status 10/04/2015 FINAL  Final  MRSA PCR Screening     Status: Abnormal   Collection Time: 10/03/15  6:00 AM  Result Value Ref Range Status   MRSA by PCR POSITIVE (A) NEGATIVE Final    Comment:        The GeneXpert MRSA Assay (FDA approved for NASAL specimens only), is one component of a comprehensive MRSA colonization surveillance program. It is not intended to diagnose MRSA infection nor to guide or monitor treatment for MRSA infections. RESULT CALLED TO, READ BACK BY AND VERIFIED WITH: Ferrel Logan RN 10:15 10/03/15 ([email or web address])      Studies: No results found.  Scheduled Meds: . antiseptic oral rinse  7 mL Mouth Rinse BID  . cefTAZidime (FORTAZ)  IV  1 g Intravenous 3 times per day  . furosemide  40 mg Intravenous Q12H  . gabapentin  300 mg Oral BID  . heparin subcutaneous  5,000 Units Subcutaneous 3 times per day  . insulin aspart  0-15 Units Subcutaneous TID WC  . insulin aspart  0-5 Units Subcutaneous QHS  . insulin glargine  26 Units Subcutaneous QHS  . ipratropium-albuterol  3 mL Nebulization QID  . sodium chloride  3 mL Intravenous Q12H  . vancomycin  1,500 mg Intravenous Q24H   Continuous Infusions:   Principal Problem:   Acute on chronic respiratory failure with hypercapnia (HCC) Active Problems:   Acute encephalopathy   Sepsis (HCC)   CKD (chronic kidney disease) stage 3, GFR 30-59 ml/min   DM (diabetes mellitus), type 2 with renal complications (HCC)   AKI (acute kidney injury) ([place])   Acute on chronic diastolic CHF (congestive heart failure) (HCC)   Cellulitis of foot, left    Time spent: 33 minutes    Cathlin Buchan M.D. [facility] Pager [phone]. If 7PM-7AM, please contact night-coverage at [email or web address], password [facility] 10/09/2015, 11:15 AM  LOS: 6 days

## 2015-10-09 NOTE — Progress Notes (Signed)
Awaiting stability per MD for d/c to [facility].  Bed is available. MD- please advise asap re: d/c.  Thanks.  Lorie Phenix. Pauline Good, [place]
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
## 2015-10-10 DIAGNOSIS — Z89511 Acquired absence of right leg below knee: Secondary | ICD-10-CM | POA: Diagnosis not present

## 2015-10-10 DIAGNOSIS — B3749 Other urogenital candidiasis: Secondary | ICD-10-CM | POA: Diagnosis not present

## 2015-10-10 DIAGNOSIS — I7389 Other specified peripheral vascular diseases: Secondary | ICD-10-CM | POA: Diagnosis not present

## 2015-10-10 DIAGNOSIS — E1149 Type 2 diabetes mellitus with other diabetic neurological complication: Secondary | ICD-10-CM | POA: Diagnosis not present

## 2015-10-10 DIAGNOSIS — G4733 Obstructive sleep apnea (adult) (pediatric): Secondary | ICD-10-CM | POA: Diagnosis not present

## 2015-10-10 DIAGNOSIS — I130 Hypertensive heart and chronic kidney disease with heart failure and stage 1 through stage 4 chronic kidney disease, or unspecified chronic kidney disease: Secondary | ICD-10-CM | POA: Diagnosis not present

## 2015-10-10 DIAGNOSIS — K219 Gastro-esophageal reflux disease without esophagitis: Secondary | ICD-10-CM | POA: Diagnosis not present

## 2015-10-10 DIAGNOSIS — G8929 Other chronic pain: Secondary | ICD-10-CM | POA: Diagnosis not present

## 2015-10-10 DIAGNOSIS — J9622 Acute and chronic respiratory failure with hypercapnia: Secondary | ICD-10-CM | POA: Diagnosis not present

## 2015-10-10 DIAGNOSIS — I5020 Unspecified systolic (congestive) heart failure: Secondary | ICD-10-CM | POA: Diagnosis not present

## 2015-10-10 DIAGNOSIS — K210 Gastro-esophageal reflux disease with esophagitis: Secondary | ICD-10-CM | POA: Diagnosis not present

## 2015-10-10 DIAGNOSIS — N183 Chronic kidney disease, stage 3 (moderate): Secondary | ICD-10-CM | POA: Diagnosis not present

## 2015-10-10 DIAGNOSIS — G9340 Encephalopathy, unspecified: Secondary | ICD-10-CM | POA: Diagnosis not present

## 2015-10-10 DIAGNOSIS — L03116 Cellulitis of left lower limb: Secondary | ICD-10-CM | POA: Diagnosis not present

## 2015-10-10 DIAGNOSIS — I87312 Chronic venous hypertension (idiopathic) with ulcer of left lower extremity: Secondary | ICD-10-CM | POA: Diagnosis not present

## 2015-10-10 DIAGNOSIS — E1143 Type 2 diabetes mellitus with diabetic autonomic (poly)neuropathy: Secondary | ICD-10-CM | POA: Diagnosis not present

## 2015-10-10 DIAGNOSIS — J9621 Acute and chronic respiratory failure with hypoxia: Secondary | ICD-10-CM | POA: Diagnosis not present

## 2015-10-10 DIAGNOSIS — E119 Type 2 diabetes mellitus without complications: Secondary | ICD-10-CM | POA: Diagnosis not present

## 2015-10-10 DIAGNOSIS — I5032 Chronic diastolic (congestive) heart failure: Secondary | ICD-10-CM | POA: Diagnosis not present

## 2015-10-10 DIAGNOSIS — N179 Acute kidney failure, unspecified: Secondary | ICD-10-CM | POA: Diagnosis not present

## 2015-10-10 DIAGNOSIS — I10 Essential (primary) hypertension: Secondary | ICD-10-CM | POA: Diagnosis not present

## 2015-10-10 DIAGNOSIS — L97229 Non-pressure chronic ulcer of left calf with unspecified severity: Secondary | ICD-10-CM | POA: Diagnosis not present

## 2015-10-10 DIAGNOSIS — J9612 Chronic respiratory failure with hypercapnia: Secondary | ICD-10-CM | POA: Diagnosis not present

## 2015-10-10 DIAGNOSIS — I5033 Acute on chronic diastolic (congestive) heart failure: Secondary | ICD-10-CM | POA: Diagnosis not present

## 2015-10-10 DIAGNOSIS — I83022 Varicose veins of left lower extremity with ulcer of calf: Secondary | ICD-10-CM | POA: Diagnosis not present

## 2015-10-10 DIAGNOSIS — F329 Major depressive disorder, single episode, unspecified: Secondary | ICD-10-CM | POA: Diagnosis not present

## 2015-10-10 DIAGNOSIS — I83029 Varicose veins of left lower extremity with ulcer of unspecified site: Secondary | ICD-10-CM | POA: Diagnosis not present

## 2015-10-10 DIAGNOSIS — S20219D Contusion of unspecified front wall of thorax, subsequent encounter: Secondary | ICD-10-CM | POA: Diagnosis not present

## 2015-10-10 DIAGNOSIS — J9620 Acute and chronic respiratory failure, unspecified whether with hypoxia or hypercapnia: Secondary | ICD-10-CM | POA: Diagnosis not present

## 2015-10-10 LAB — GLUCOSE, CAPILLARY
GLUCOSE-CAPILLARY: 221 mg/dL — AB (ref 65–99)
GLUCOSE-CAPILLARY: 233 mg/dL — AB (ref 65–99)

## 2015-10-10 LAB — BASIC METABOLIC PANEL
ANION GAP: 9 (ref 5–15)
BUN: 22 mg/dL — ABNORMAL HIGH (ref 6–20)
CO2: 36 mmol/L — ABNORMAL HIGH (ref 22–32)
Calcium: 8.8 mg/dL — ABNORMAL LOW (ref 8.9–10.3)
Chloride: 97 mmol/L — ABNORMAL LOW (ref 101–111)
Creatinine, Ser: 1.23 mg/dL — ABNORMAL HIGH (ref 0.44–1.00)
GFR calc Af Amer: 50 mL/min — ABNORMAL LOW (ref >60)
GFR, EST NON AFRICAN AMERICAN: 43 mL/min — AB (ref >60)
Glucose, Bld: 219 mg/dL — ABNORMAL HIGH (ref 65–99)
POTASSIUM: 3.8 mmol/L (ref 3.5–5.1)
SODIUM: 142 mmol/L (ref 135–145)

## 2015-10-10 MED ORDER — DOXYCYCLINE HYCLATE 100 MG PO TABS
100.0000 mg | ORAL_TABLET | Freq: Two times a day (BID) | ORAL | Status: DC
  Administered 2015-10-10: 100 mg via ORAL
  Filled 2015-10-10: qty 1

## 2015-10-10 MED ORDER — CEPHALEXIN 500 MG PO CAPS: 500.0000 mg | ORAL_CAPSULE | Freq: Three times a day (TID) | ORAL | Status: DC

## 2015-10-10 MED ORDER — HYDROCODONE-ACETAMINOPHEN 5-325 MG PO TABS: 1.0000 | ORAL_TABLET | ORAL | Status: DC | PRN

## 2015-10-10 MED ORDER — INSULIN GLARGINE 100 UNIT/ML SOLOSTAR PEN: 26.0000 [IU] | PEN_INJECTOR | Freq: Every day | SUBCUTANEOUS | Status: DC

## 2015-10-10 MED ORDER — DOXYCYCLINE HYCLATE 100 MG PO TABS: 100.0000 mg | ORAL_TABLET | Freq: Two times a day (BID) | ORAL | Status: DC

## 2015-10-10 NOTE — Progress Notes (Signed)
Occupational Therapy Treatment Patient Details Name: Heather Zimmerman. Heather Zimmerman MRN: [id number] DOB: [birth year] Today's Date: 10/10/2015    History of present illness Heather Zimmerman is a 69 y.o. female with history of morbid obesity, R BKA, diabetes mellitus, CHF has brought from the nursing home after patient was found to have persistent fever and headache since yesterday. admitted for acute encephalopathy with possible developing sepsis source not clear could be UTI   OT comments  Pt performed Level 1 theraband exercises, tolerated well. Demonstrated knowledge of exercises Zimmerman she can perform outside of therapy sessions.  Follow Up Recommendations  SNF    Equipment Recommendations       Recommendations for Other Services      Precautions / Restrictions Precautions Precautions: Fall       Mobility Bed Mobility                  Transfers                      Balance                                   ADL                                                Vision                     Perception     Praxis      Cognition   Behavior During Therapy: WFL for tasks assessed/performed Overall Cognitive Status: Within Functional Limits for tasks assessed                       Extremity/Trunk Assessment               Exercises General Exercises - Upper Extremity Shoulder Flexion: Strengthening;Both;15 reps;Supine;Theraband Theraband Level (Shoulder Flexion): Level 1 (Yellow) Shoulder Extension: Strengthening;Both;15 reps;Supine;Theraband Theraband Level (Shoulder Extension): Level 1 (Yellow) Shoulder Horizontal ABduction: Strengthening;Both;15 reps;Supine;Theraband Theraband Level (Shoulder Horizontal Abduction): Level 1 (Yellow) Elbow Flexion: Strengthening;Both;15 reps;Supine;Theraband Theraband Level (Elbow Flexion): Level 1 (Yellow) Elbow Extension: Strengthening;Both;15 reps;Supine;Theraband Theraband  Level (Elbow Extension): Level 1 (Yellow)   Shoulder Instructions       General Comments      Pertinent Vitals/ Pain       Pain Assessment: Faces Faces Pain Scale: Hurts a little bit Pain Location: R shoulder with resistive exercise Pain Descriptors / Indicators:  (arthritic pain) Pain Intervention(s): Limited activity within patient's tolerance  Home Living                                          Prior Functioning/Environment              Frequency Min 2X/week     Progress Toward Goals  OT Goals(current goals can now be found in the care plan section)  Progress towards OT goals: Progressing toward goals  Acute Rehab OT Goals Patient Stated Goal: to get more therapy (per her report she was no longer getting [facility] at [facility]); move to a SNF in [place] to be near sister Time For Goal Achievement: 10/20/15  Plan Discharge plan remains appropriate    Co-evaluation                 End of Session Equipment Utilized During Treatment: Oxygen   Activity Tolerance Patient tolerated treatment well   Patient Left in bed;with call bell/phone within reach   Nurse Communication          Time: [id number] OT Time Calculation (min): 22 min  Charges: OT General Charges $OT Visit: 1 Procedure OT Treatments $Therapeutic Exercise: 8-22 mins  Heather Zimmerman 10/10/2015, 12:22 PM  [phone]

## 2015-10-10 NOTE — Discharge Summary (Signed)
Physician Discharge Summary  Diego Chahal. [name] [id number] DOB: 08/09/1946 DOA: 10/03/2015  PCP: Gildardo Cranker, DO  Admit date: 10/03/2015 Discharge date: 10/10/2015  Time spent: 65 minutes  Recommendations for Outpatient Follow-up:  1. Patient with discharge to skilled nursing facility. Patient will follow-up with M.D. at the skilled nursing facility. Patient will need a basic metabolic profile done in 1 week to follow-up on electrolytes and renal function.   Discharge Diagnoses:  Principal Problem:   Acute on chronic respiratory failure with hypercapnia (HCC) Active Problems:   Acute encephalopathy   Sepsis (HCC)   CKD (chronic kidney disease) stage 3, GFR 30-59 ml/min   DM (diabetes mellitus), type 2 with renal complications (HCC)   AKI (acute kidney injury) ([place])   Acute on chronic diastolic CHF (congestive heart failure) ([place])   Cellulitis of foot, left   Discharge Condition: Stable and improved.  Diet recommendation: Heart healthy.  Filed Weights   10/08/15 0528 10/09/15 0646 10/10/15 0711  Weight: 144.244 kg (318 lb) 140.615 kg (310 lb) 141.25 kg (311 lb 6.4 oz)    History of present illness:  Per Dr Daron Offer. Heather Zimmerman is a 69 y.o. female with history of morbid obesity, diabetes mellitus, CHF has brought from the nursing home after patient was found to have persistent fever and headache x 1 day. On admission patient appeared confused and lethargic but improved with antibiotics and fluids. On admitting MD exam patient was following commands and moving extremities but still confused. Chest x-ray in the ER showed congestion with UA showing possible UTI. Patient was given 4 L fluid bolus for sepsis protocol. Blood cultures were obtained and admitted for acute encephalopathy with possible developing sepsis source not clear could be UTI. Patient does have chronic left lower extremity venous stasis ulcers which did not look infected at this time. Admitting MD did  discuss with patient's brother who was not able to provide much history.    Hospital Course:   69 year old female with a history of diabetes mellitus, hypertension, diastolic CHF, chronic pain syndrome presented from a nursing home after the patient was found to have persistent fever and headache on the day prior to admission. Upon presentation, the patient was confused and lethargic with a fever of 104F. Because of concerns for sepsis, the patient was given antibiotics and 4 L of normal saline in the emergency department. Initial blood gas showed pH 7.26 with PCO2 78. The patient was placed on BiPAP and subsequently moved to the ICU secondary to hypercarbic respiratory failure. She was noted to have WBC 18.6 and procalcitonin peaked at 5.20. Notably, the patient was recently discharged from the hospital on 07/21/2015 after treatment for sepsis secondary to left lower extremity cellulitis. After transfer out of the ICU, the patient still. Fluid overload. Intravenous furosemide was restarted, and intravenous antibiotics were continued.    #1 acute on chronic respiratory failure with hypercarbia and hypoxemia secondary to acute on chronic diastolic heart failure and obstructive sleep apnea Patient was initially admitted to the ICU where she was placed on the BiPAP and noted to be volume overloaded and was on empiric antibiotics. As patient improved and was transferred to the floor K was transferred to [facility]. Patient was subsequently maintained on IV diuretics with clinical improvement. Patient diuresed well. Patient is -10.2 L during this hospitalization and -2.6 L over the past 24 hours. Current weight is 311 pounds. Patient sats 95% on 2 L nasal cannula. Patient improved clinically and was subsequently transitioned  to oral Lasix. Patient will be discharged home on a home regimen of Demadex and is to follow-up with M.D. at skilled nursing facility. Patient was also maintained on a   CPAP. Patient was discharged in stable and improved condition.   #2 acute on chronic kidney disease stage III Likely secondary to prerenal azotemia secondary to volume overload. Improved with diuresis. Currently at baseline. Follow.  #3 acute encephalopathy Likely secondary to cellulitis. Resolved. At baseline.  #4 left lower extremity cellulitis Patient has been seen by wound care. Patient was initially placed empirically on IV vancomycin and Ceftazadime. Patient improved clinically leukocytosis trended down and leukocytosis had resolved by day of discharge. Patient be discharged on oral Keflex and oral doxycycline for 3 more days to complete a ten-day course of antibiotic therapy. Outpatient follow-up.  #5 diabetes mellitus type 2 Hemoglobin A1c 7.3 on 10/06/2015. Patient was maintained on Lantus and dose adjusted to 26 units daily as well as sliding scale insulin during the hospitalization. Outpatient follow-up.  Procedures:  CT head 10/03/2015  Chest x-ray 10/05/2015, 10/03/2015  Renal ultrasound 10/04/2015    Consultations:  PCCM: Dr. Chase Caller 10/03/2015/Dr. Titus Mould 10/04/2015    Discharge Exam: Filed Vitals:   10/10/15 0711 10/10/15 1132  BP: 104/55 115/63  Pulse: 77 72  Temp: 98.6 F (37 C) 98.6 F (37 C)  Resp: 18 18    General: NAD Cardiovascular: RRR Respiratory: CTAB anterior lung fields.  Discharge Instructions   Discharge Instructions    Diet - low sodium heart healthy    Complete by:  As directed      Increase activity slowly    Complete by:  As directed           Current Discharge Medication List    START taking these medications   Details  cephALEXin (KEFLEX) 500 MG capsule Take 1 capsule (500 mg total) by mouth every 8 (eight) hours. TAKE FOR 3 DAYS THEN STOP. Qty: 9 capsule, Refills: 0    doxycycline (VIBRA-TABS) 100 MG tablet Take 1 tablet (100 mg total) by mouth every 12 (twelve) hours. Take for 3 days then stop. Qty: 6 tablet,  Refills: 0      CONTINUE these medications which have CHANGED   Details  HYDROcodone-acetaminophen (NORCO/VICODIN) 5-325 MG tablet Take 1 tablet by mouth every 4 (four) hours as needed for moderate pain or severe pain. Qty: 20 tablet, Refills: 0    Insulin Glargine (LANTUS SOLOSTAR) 100 UNIT/ML Solostar Pen Inject 26 Units into the skin at bedtime. Qty: 15 mL, Refills: 0      CONTINUE these medications which have NOT CHANGED   Details  acetaminophen (TYLENOL) 650 MG CR tablet Take 650 mg by mouth every 6 (six) hours as needed for pain.    antiseptic oral rinse (BIOTENE) LIQD 15 mLs by Mouth Rinse route as needed for dry mouth.    aspirin 81 MG tablet Take 81 mg by mouth daily.    camphor-menthol (SARNA) lotion Apply topically as needed for itching. Qty: 222 mL, Refills: 0    cetirizine-pseudoephedrine (ZYRTEC-D) 5-120 MG per tablet Take 1 tablet by mouth 2 (two) times daily.    cholecalciferol (VITAMIN D) 1000 UNITS tablet Take 2,000 Units by mouth daily.    DULoxetine (CYMBALTA) 20 MG capsule Take 40 mg by mouth daily.     Fluticasone Propionate (FLONASE NA) Place 1 spray into the nose daily as needed. For nasal congestion    Fluticasone-Salmeterol (ADVAIR) 250-50 MCG/DOSE AEPB Inhale 1 puff into the  lungs 2 (two) times daily.    gabapentin (NEURONTIN) 600 MG tablet Take 600 mg by mouth 2 (two) times daily.    insulin aspart (NOVOLOG) 100 UNIT/ML injection Inject 5 Units into the skin 3 (three) times daily before meals. With an additional 5 units for cg >=150 Qty: 10 mL, Refills: prn   Associated Diagnoses: Type II diabetes mellitus with neurological manifestations ([place])    ipratropium-albuterol (DUONEB) 0.5-2.5 (3) MG/3ML SOLN Take 3 mLs by nebulization every 6 (six) hours as needed (shortness of breath). Qty: 360 mL    magnesium oxide (MAG-OX) 400 MG tablet Take 400 mg by mouth every 8 (eight) hours.     omeprazole (PRILOSEC) 20 MG capsule Take 20 mg by mouth daily.     potassium chloride (K-DUR,KLOR-CON) 10 MEQ tablet Take 20 mEq by mouth daily.    torsemide (DEMADEX) 20 MG tablet Take 1 tablet (20 mg total) by mouth 2 (two) times daily. Qty: 90 tablet, Refills: 11   Associated Diagnoses: Chronic diastolic heart failure (HCC)      STOP taking these medications     acetaminophen (TYLENOL) 160 MG/5ML solution      doxycycline (VIBRAMYCIN) 100 MG capsule        Allergies  Allergen Reactions  . Ace Inhibitors     unknown   Follow-up Information    Please follow up.   Why:  f/u MD at SNF       The results of significant diagnostics from this hospitalization (including imaging, microbiology, ancillary and laboratory) are listed below for reference.    Significant Diagnostic Studies: Ct Head Wo Contrast  10/03/2015  CLINICAL DATA:  Fever and headache. Evaluate for intracranial hemorrhage. EXAM: CT HEAD WITHOUT CONTRAST TECHNIQUE: Contiguous axial images were obtained from the base of the skull through the vertex without intravenous contrast. COMPARISON:  None. FINDINGS: Motion degraded study which could obscure pathology. Skull and Sinuses:No evidence of acute fracture or destructive process. Right maxillary sinus opacification and atelectasis compatible with chronic obstruction. Visualized orbits: Remote blowout fracture of the medial wall on the right. No acute finding. Brain: No evidence of acute infarction, hemorrhage, hydrocephalus, or mass lesion/mass effect. IMPRESSION: 1. No acute finding. 2. Moderate limitation due to motion. 3. Chronic right maxillary sinus obstruction. Electronically Signed   By: Monte Fantasia M.D.   On: 10/03/2015 03:27   US Renal  10/04/2015  CLINICAL DATA:  Acute renal failure, diabetes, hypertension EXAM: RENAL / URINARY TRACT ULTRASOUND COMPLETE COMPARISON:  None. FINDINGS: Right Kidney: Length: 12.7 cm. Echogenicity within normal limits. There is a lower pole cyst measures 3.7 x 3.5 cm. Left Kidney: Length: 10.9 cm  in length. Limited visualization of the left kidney due to patient's large body habitus. No hydronephrosis. Bladder: A Foley catheter is noted in decompressed urinary bladder. IMPRESSION: No hydronephrosis. There is a cyst in lower pole of the right kidney measures 3.7 x 3.5 cm. Suboptimal visualization of the left kidney due to patient's large body habitus. Decompressed urinary bladder with Foley catheter. Electronically Signed   By: Lahoma Crocker M.D.   On: 10/04/2015 12:39   Dg Chest Port 1 View  10/05/2015  CLINICAL DATA:  69 year old female with shortness of breath, acute on chronic respiratory failure, sepsis. Initial encounter. EXAM: PORTABLE CHEST 1 VIEW COMPARISON:  10/03/2015 and earlier. FINDINGS: Portable AP semi upright view at 0626 hours. Continued low lung volumes. Stable cardiac size and mediastinal contours. Mildly increased perihilar interstitial opacity since 10/03/2015. No superimposed pneumothorax, pleural effusion,  or consolidation. IMPRESSION: Continued low lung volumes. Mildly increased interstitial opacity since 10/03/2015, favor due to increased pulmonary vascularity. Electronically Signed   By: Genevie Ann M.D.   On: 10/05/2015 07:37   Dg Chest Port 1 View  10/03/2015  CLINICAL DATA:  Patient with respiratory failure, ventilator dependent. EXAM: PORTABLE CHEST 1 VIEW COMPARISON:  Chest radiograph 10/03/2015. FINDINGS: Monitoring leads overlie the patient. Stable enlarged cardiac and mediastinal contours. Low lung volumes. Unchanged pulmonary vascular redistribution and interstitial pulmonary edema. No large area of pulmonary consolidation or pleural effusion. IMPRESSION: Cardiomegaly with mild interstitial pulmonary edema, grossly unchanged from recent prior exam. Electronically Signed   By: Lovey Newcomer M.D.   On: 10/03/2015 07:15   Dg Chest Port 1 View  10/03/2015  CLINICAL DATA:  26-year-old female with sepsis and fever. EXAM: PORTABLE CHEST 1 VIEW COMPARISON:  Radiograph dated  07/18/2015 FINDINGS: Single portable view of the chest demonstrate cardiomegaly with central vascular prominence compatible with congestive changes. There is no focal consolidation, pleural effusion, or pneumothorax. There is degenerative changes of the shoulders. No acute fracture. IMPRESSION: Cardiomegaly with congestive changes.  No focal consolidation. Electronically Signed   By: Anner Crete M.D.   On: 10/03/2015 02:24    Microbiology: Recent Results (from the past 240 hour(s))  Blood Culture (routine x 2)     Status: None   Collection Time: 10/03/15  1:48 AM  Result Value Ref Range Status   Specimen Description BLOOD LEFT ARM  Final   Special Requests AEROBIC BOTTLE ONLY 5ML  Final   Culture NO GROWTH 5 DAYS  Final   Report Status 10/08/2015 FINAL  Final  Blood Culture (routine x 2)     Status: None   Collection Time: 10/03/15  2:00 AM  Result Value Ref Range Status   Specimen Description BLOOD RIGHT ARM  Final   Special Requests BOTTLES DRAWN AEROBIC AND ANAEROBIC 5ML  Final   Culture NO GROWTH 5 DAYS  Final   Report Status 10/08/2015 FINAL  Final  Urine culture     Status: None   Collection Time: 10/03/15  2:35 AM  Result Value Ref Range Status   Specimen Description URINE, RANDOM  Final   Special Requests NONE  Final   Culture MULTIPLE SPECIES PRESENT, SUGGEST RECOLLECTION  Final   Report Status 10/04/2015 FINAL  Final  MRSA PCR Screening     Status: Abnormal   Collection Time: 10/03/15  6:00 AM  Result Value Ref Range Status   MRSA by PCR POSITIVE (A) NEGATIVE Final    Comment:        The GeneXpert MRSA Assay (FDA approved for NASAL specimens only), is one component of a comprehensive MRSA colonization surveillance program. It is not intended to diagnose MRSA infection nor to guide or monitor treatment for MRSA infections. RESULT CALLED TO, READ BACK BY AND VERIFIED WITH: MChrisandra Carota RN 10:15 10/03/15 ([email or web address])      Labs: Basic Metabolic Panel:  Recent  Labs Lab 10/04/15 0328 10/05/15 0433 10/06/15 0423 10/07/15 0331 10/07/15 1455 10/08/15 0434 10/09/15 0441 10/10/15 0715  NA 144 141 140  --  137 140 143 142  K 4.0 4.0 4.2  --  3.9 4.9 3.5 3.8  CL 104 99* 99*  --  95* 96* 97* 97*  CO2 32 32 30  --  32 36* 37* 36*  GLUCOSE 240* 260* 382*  --  380* 208* 124* 219*  BUN 29* 31* 27*  --  28* 26* 22*  22*  CREATININE 1.81* 1.60* 1.36*  --  1.37* 1.26* 1.41* 1.23*  CALCIUM 8.6* 8.6* 9.0  --  8.8* 8.8* 9.2 8.8*  MG 2.1 2.1 2.1 2.0  --   --   --   --   PHOS 3.1 3.1 2.7 3.1  --   --   --   --    Liver Function Tests: No results for input(s): AST, ALT, ALKPHOS, BILITOT, PROT, ALBUMIN in the last 168 hours. No results for input(s): LIPASE, AMYLASE in the last 168 hours. No results for input(s): AMMONIA in the last 168 hours. CBC:  Recent Labs Lab 10/04/15 0328 10/05/15 0433 10/07/15 1455 10/08/15 0434  WBC 16.5* 17.2* 7.4 9.0  NEUTROABS 15.7*  --   --   --   HGB 10.3* 10.1* 11.2* 11.6*  HCT 34.2* 33.2* 36.8 39.4  MCV 92.9 91.5 90.0 89.7  PLT 166 180 193 214   Cardiac Enzymes:  Recent Labs Lab 10/03/15 1225 10/03/15 1841  TROPONINI 0.03 0.03   BNP: BNP (last 3 results) No results for input(s): BNP in the last 8760 hours.  ProBNP (last 3 results) No results for input(s): PROBNP in the last 8760 hours.  CBG:  Recent Labs Lab 10/09/15 1122 10/09/15 1651 10/09/15 2122 10/10/15 0624 10/10/15 1059  GLUCAP 173* 273* 216* 221* 233*       Signed:  Marialy Urbanczyk MD  FACP  [facility] 10/10/2015, 11:42 AM

## 2015-10-10 NOTE — Progress Notes (Signed)
Report given to [employer].

## 2015-10-10 NOTE — Progress Notes (Signed)
Patient will discharge to [facility] Anticipated discharge date: 12/27 Transportation by [facility]- scheduled for 1:30pm  CSW signing off.  Domenica Reamer, [place] Social Worker [phone]

## 2015-10-10 NOTE — Progress Notes (Signed)
Inpatient Diabetes Program Recommendations  AACE/ADA: New Consensus Statement on Inpatient Glycemic Control (2015)  Target Ranges:  Prepandial:   less than 140 mg/dL      Peak postprandial:   less than 180 mg/dL (1-2 hours)      Critically ill patients:  140 - 180 mg/dL   Review of Glycemic Control:  Results for Cantrell, ELLIAH DERING (MRN [id number]) as of 10/10/2015 11:21  Ref. Range 10/09/2015 11:22 10/09/2015 16:51 10/09/2015 21:22 10/10/2015 06:24 10/10/2015 10:59  Glucose-Capillary Latest Ref Range: 65-99 mg/dL 173 (H) 273 (H) 216 (H) 221 (H) 233 (H)   Results for Worley, DOMITILA SAILER (MRN [id number]) as of 10/10/2015 11:21  Ref. Range 10/06/2015 04:23  Hemoglobin A1C Latest Ref Range: 4.8-5.6 % 7.3 (H)   Diabetes history: Type 2 diabetes Outpatient Diabetes medications: Lantus 20 units daily, Novolog 5 units tid with meals Current orders for Inpatient glycemic control:  Lantus 26 units q HS, Novolog moderate tid with meals and HS  Inpatient Diabetes Program Recommendations:   May consider adding Novolog meal coverage 4 units tid with meals-Hold if patient eats less than 50%.  Thanks, Adah Perl, RN, BC-ADM Inpatient Diabetes Coordinator Pager [phone] (8a-5p)

## 2015-10-12 ENCOUNTER — Encounter: Payer: Self-pay | Admitting: Internal Medicine

## 2015-10-12 ENCOUNTER — Non-Acute Institutional Stay (SKILLED_NURSING_FACILITY): Payer: Medicare Other | Admitting: Internal Medicine

## 2015-10-12 DIAGNOSIS — G8929 Other chronic pain: Secondary | ICD-10-CM | POA: Diagnosis not present

## 2015-10-12 DIAGNOSIS — E1149 Type 2 diabetes mellitus with other diabetic neurological complication: Secondary | ICD-10-CM

## 2015-10-12 DIAGNOSIS — F329 Major depressive disorder, single episode, unspecified: Secondary | ICD-10-CM

## 2015-10-12 DIAGNOSIS — I5032 Chronic diastolic (congestive) heart failure: Secondary | ICD-10-CM

## 2015-10-12 DIAGNOSIS — F32A Depression, unspecified: Secondary | ICD-10-CM

## 2015-10-12 DIAGNOSIS — Z89511 Acquired absence of right leg below knee: Secondary | ICD-10-CM

## 2015-10-12 DIAGNOSIS — I10 Essential (primary) hypertension: Secondary | ICD-10-CM

## 2015-10-12 DIAGNOSIS — G4733 Obstructive sleep apnea (adult) (pediatric): Secondary | ICD-10-CM | POA: Diagnosis not present

## 2015-10-12 DIAGNOSIS — J9612 Chronic respiratory failure with hypercapnia: Secondary | ICD-10-CM

## 2015-10-12 DIAGNOSIS — L03116 Cellulitis of left lower limb: Secondary | ICD-10-CM | POA: Diagnosis not present

## 2015-10-12 NOTE — Progress Notes (Signed)
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 restriction as ordered  GOAL: complete abx. PT/OT as indicated. Communicated with pt and nursing.  Will follow  Kila Godina S. Perlie Gold  [facility] [address] [place], [place] [address] [phone] Cell (Monday-Friday 8 AM - 5 PM) [phone] After 5 PM and follow prompts

## 2015-10-14 ENCOUNTER — Encounter: Payer: Self-pay | Admitting: Adult Health
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
## 2015-10-18 ENCOUNTER — Non-Acute Institutional Stay (SKILLED_NURSING_FACILITY): Payer: Medicare Other | Admitting: Adult Health

## 2015-10-18 DIAGNOSIS — B3749 Other urogenital candidiasis: Secondary | ICD-10-CM | POA: Diagnosis not present

## 2015-10-18 DIAGNOSIS — I83029 Varicose veins of left lower extremity with ulcer of unspecified site: Secondary | ICD-10-CM | POA: Diagnosis not present

## 2015-10-18 DIAGNOSIS — L97929 Non-pressure chronic ulcer of unspecified part of left lower leg with unspecified severity: Principal | ICD-10-CM

## 2015-10-26 ENCOUNTER — Encounter: Payer: Self-pay | Admitting: Adult Health

## 2015-10-26 DIAGNOSIS — B3749 Other urogenital candidiasis: Secondary | ICD-10-CM | POA: Insufficient documentation
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
## 2015-11-09 ENCOUNTER — Non-Acute Institutional Stay (SKILLED_NURSING_FACILITY): Payer: Medicare Other | Admitting: Adult Health

## 2015-11-09 DIAGNOSIS — J9622 Acute and chronic respiratory failure with hypercapnia: Secondary | ICD-10-CM | POA: Diagnosis not present

## 2015-11-09 DIAGNOSIS — K2100 Gastro-esophageal reflux disease with esophagitis, without bleeding: Secondary | ICD-10-CM

## 2015-11-09 DIAGNOSIS — E1143 Type 2 diabetes mellitus with diabetic autonomic (poly)neuropathy: Secondary | ICD-10-CM | POA: Diagnosis not present

## 2015-11-09 DIAGNOSIS — N183 Chronic kidney disease, stage 3 unspecified: Secondary | ICD-10-CM

## 2015-11-09 DIAGNOSIS — G8929 Other chronic pain: Secondary | ICD-10-CM

## 2015-11-09 DIAGNOSIS — F329 Major depressive disorder, single episode, unspecified: Secondary | ICD-10-CM

## 2015-11-09 DIAGNOSIS — I83029 Varicose veins of left lower extremity with ulcer of unspecified site: Secondary | ICD-10-CM

## 2015-11-09 DIAGNOSIS — I5032 Chronic diastolic (congestive) heart failure: Secondary | ICD-10-CM

## 2015-11-09 DIAGNOSIS — F32A Depression, unspecified: Secondary | ICD-10-CM

## 2015-11-09 DIAGNOSIS — E1149 Type 2 diabetes mellitus with other diabetic neurological complication: Secondary | ICD-10-CM

## 2015-11-09 DIAGNOSIS — I509 Heart failure, unspecified: Secondary | ICD-10-CM

## 2015-11-09 DIAGNOSIS — I130 Hypertensive heart and chronic kidney disease with heart failure and stage 1 through stage 4 chronic kidney disease, or unspecified chronic kidney disease: Secondary | ICD-10-CM | POA: Diagnosis not present

## 2015-11-09 DIAGNOSIS — G4733 Obstructive sleep apnea (adult) (pediatric): Secondary | ICD-10-CM | POA: Diagnosis not present

## 2015-11-09 DIAGNOSIS — L97929 Non-pressure chronic ulcer of unspecified part of left lower leg with unspecified severity: Secondary | ICD-10-CM

## 2015-11-10 DIAGNOSIS — S20219D Contusion of unspecified front wall of thorax, subsequent encounter: Secondary | ICD-10-CM | POA: Diagnosis not present

## 2015-11-10 DIAGNOSIS — I83022 Varicose veins of left lower extremity with ulcer of calf: Secondary | ICD-10-CM | POA: Diagnosis not present

## 2015-11-15 DIAGNOSIS — I5032 Chronic diastolic (congestive) heart failure: Secondary | ICD-10-CM | POA: Diagnosis not present

## 2015-11-15 DIAGNOSIS — I83014 Varicose veins of right lower extremity with ulcer of heel and midfoot: Secondary | ICD-10-CM | POA: Diagnosis not present

## 2015-11-15 DIAGNOSIS — G8929 Other chronic pain: Secondary | ICD-10-CM | POA: Diagnosis not present

## 2015-11-15 DIAGNOSIS — I83022 Varicose veins of left lower extremity with ulcer of calf: Secondary | ICD-10-CM | POA: Diagnosis not present

## 2015-11-15 DIAGNOSIS — N183 Chronic kidney disease, stage 3 (moderate): Secondary | ICD-10-CM | POA: Diagnosis not present

## 2015-11-15 DIAGNOSIS — K219 Gastro-esophageal reflux disease without esophagitis: Secondary | ICD-10-CM | POA: Diagnosis not present

## 2015-11-15 DIAGNOSIS — J9620 Acute and chronic respiratory failure, unspecified whether with hypoxia or hypercapnia: Secondary | ICD-10-CM | POA: Diagnosis not present

## 2015-11-15 DIAGNOSIS — F329 Major depressive disorder, single episode, unspecified: Secondary | ICD-10-CM | POA: Diagnosis not present

## 2015-11-15 DIAGNOSIS — S300XXD Contusion of lower back and pelvis, subsequent encounter: Secondary | ICD-10-CM | POA: Diagnosis not present

## 2015-11-15 DIAGNOSIS — E119 Type 2 diabetes mellitus without complications: Secondary | ICD-10-CM | POA: Diagnosis not present

## 2015-11-15 DIAGNOSIS — L97229 Non-pressure chronic ulcer of left calf with unspecified severity: Secondary | ICD-10-CM | POA: Diagnosis not present

## 2015-11-15 DIAGNOSIS — I7389 Other specified peripheral vascular diseases: Secondary | ICD-10-CM | POA: Diagnosis not present

## 2015-11-15 DIAGNOSIS — L03116 Cellulitis of left lower limb: Secondary | ICD-10-CM | POA: Diagnosis not present

## 2015-11-15 DIAGNOSIS — I83023 Varicose veins of left lower extremity with ulcer of ankle: Secondary | ICD-10-CM | POA: Diagnosis not present

## 2015-11-15 DIAGNOSIS — J9621 Acute and chronic respiratory failure with hypoxia: Secondary | ICD-10-CM | POA: Diagnosis not present

## 2015-11-15 DIAGNOSIS — I87312 Chronic venous hypertension (idiopathic) with ulcer of left lower extremity: Secondary | ICD-10-CM | POA: Diagnosis not present

## 2015-11-17 DIAGNOSIS — I83022 Varicose veins of left lower extremity with ulcer of calf: Secondary | ICD-10-CM | POA: Diagnosis not present

## 2015-11-17 DIAGNOSIS — S300XXD Contusion of lower back and pelvis, subsequent encounter: Secondary | ICD-10-CM | POA: Diagnosis not present

## 2015-11-24 DIAGNOSIS — I83014 Varicose veins of right lower extremity with ulcer of heel and midfoot: Secondary | ICD-10-CM | POA: Diagnosis not present

## 2015-11-24 DIAGNOSIS — I83022 Varicose veins of left lower extremity with ulcer of calf: Secondary | ICD-10-CM | POA: Diagnosis not present

## 2015-11-28 LAB — LIPID PANEL
Cholesterol: 164 mg/dL (ref 0–200)
HDL: 43 mg/dL (ref 35–70)
LDL Cholesterol: 97 mg/dL
Triglycerides: 118 mg/dL (ref 40–160)

## 2015-12-01 DIAGNOSIS — I83022 Varicose veins of left lower extremity with ulcer of calf: Secondary | ICD-10-CM | POA: Diagnosis not present

## 2015-12-01 DIAGNOSIS — I83023 Varicose veins of left lower extremity with ulcer of ankle: Secondary | ICD-10-CM | POA: Diagnosis not present

## 2015-12-05 ENCOUNTER — Encounter: Payer: Self-pay | Admitting: Adult Health
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
## 2015-12-07 ENCOUNTER — Encounter: Payer: Self-pay | Admitting: Adult Health

## 2015-12-07 ENCOUNTER — Non-Acute Institutional Stay (SKILLED_NURSING_FACILITY): Payer: Medicare Other | Admitting: Adult Health

## 2015-12-07 DIAGNOSIS — I83029 Varicose veins of left lower extremity with ulcer of unspecified site: Secondary | ICD-10-CM

## 2015-12-07 DIAGNOSIS — G4733 Obstructive sleep apnea (adult) (pediatric): Secondary | ICD-10-CM | POA: Diagnosis not present

## 2015-12-07 DIAGNOSIS — E1121 Type 2 diabetes mellitus with diabetic nephropathy: Secondary | ICD-10-CM

## 2015-12-07 DIAGNOSIS — J9622 Acute and chronic respiratory failure with hypercapnia: Secondary | ICD-10-CM

## 2015-12-07 DIAGNOSIS — K2100 Gastro-esophageal reflux disease with esophagitis, without bleeding: Secondary | ICD-10-CM

## 2015-12-07 DIAGNOSIS — N183 Chronic kidney disease, stage 3 unspecified: Secondary | ICD-10-CM

## 2015-12-07 DIAGNOSIS — I509 Heart failure, unspecified: Secondary | ICD-10-CM

## 2015-12-07 DIAGNOSIS — I5032 Chronic diastolic (congestive) heart failure: Secondary | ICD-10-CM | POA: Diagnosis not present

## 2015-12-07 DIAGNOSIS — I130 Hypertensive heart and chronic kidney disease with heart failure and stage 1 through stage 4 chronic kidney disease, or unspecified chronic kidney disease: Secondary | ICD-10-CM

## 2015-12-07 DIAGNOSIS — Z89511 Acquired absence of right leg below knee: Secondary | ICD-10-CM

## 2015-12-07 DIAGNOSIS — L97929 Non-pressure chronic ulcer of unspecified part of left lower leg with unspecified severity: Secondary | ICD-10-CM
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
## 2015-12-08 DIAGNOSIS — I739 Peripheral vascular disease, unspecified: Secondary | ICD-10-CM | POA: Diagnosis not present

## 2015-12-08 DIAGNOSIS — I83022 Varicose veins of left lower extremity with ulcer of calf: Secondary | ICD-10-CM | POA: Diagnosis not present

## 2015-12-08 DIAGNOSIS — B351 Tinea unguium: Secondary | ICD-10-CM | POA: Diagnosis not present

## 2015-12-08 DIAGNOSIS — M79672 Pain in left foot: Secondary | ICD-10-CM | POA: Diagnosis not present

## 2015-12-08 DIAGNOSIS — E0821 Diabetes mellitus due to underlying condition with diabetic nephropathy: Secondary | ICD-10-CM | POA: Diagnosis not present

## 2015-12-08 DIAGNOSIS — E785 Hyperlipidemia, unspecified: Secondary | ICD-10-CM | POA: Diagnosis not present

## 2015-12-08 DIAGNOSIS — I83023 Varicose veins of left lower extremity with ulcer of ankle: Secondary | ICD-10-CM | POA: Diagnosis not present

## 2015-12-08 DIAGNOSIS — E119 Type 2 diabetes mellitus without complications: Secondary | ICD-10-CM | POA: Diagnosis not present

## 2015-12-08 LAB — BASIC METABOLIC PANEL: GLUCOSE: 171 mg/dL

## 2015-12-08 LAB — HEMOGLOBIN A1C: HEMOGLOBIN A1C: 7.6

## 2015-12-13 DIAGNOSIS — E0821 Diabetes mellitus due to underlying condition with diabetic nephropathy: Secondary | ICD-10-CM | POA: Diagnosis not present

## 2015-12-15 DIAGNOSIS — I83028 Varicose veins of left lower extremity with ulcer other part of lower leg: Secondary | ICD-10-CM | POA: Diagnosis not present

## 2015-12-15 DIAGNOSIS — I83022 Varicose veins of left lower extremity with ulcer of calf: Secondary | ICD-10-CM | POA: Diagnosis not present

## 2015-12-15 LAB — MICROALBUMIN, URINE: Microalb, Ur: 3.5

## 2015-12-17 LAB — BASIC METABOLIC PANEL: Glucose: 226 mg/dL

## 2015-12-18 ENCOUNTER — Encounter: Payer: Self-pay | Admitting: Adult Health

## 2015-12-18 ENCOUNTER — Non-Acute Institutional Stay (SKILLED_NURSING_FACILITY): Payer: Medicare Other | Admitting: Adult Health

## 2015-12-18 DIAGNOSIS — R0989 Other specified symptoms and signs involving the circulatory and respiratory systems: Secondary | ICD-10-CM | POA: Diagnosis not present

## 2015-12-18 DIAGNOSIS — F329 Major depressive disorder, single episode, unspecified: Secondary | ICD-10-CM | POA: Diagnosis not present

## 2015-12-18 DIAGNOSIS — J0110 Acute frontal sinusitis, unspecified: Secondary | ICD-10-CM | POA: Diagnosis not present

## 2015-12-18 DIAGNOSIS — R05 Cough: Secondary | ICD-10-CM | POA: Diagnosis not present
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
## 2015-12-19 ENCOUNTER — Inpatient Hospital Stay (HOSPITAL_COMMUNITY)
Admission: EM | Admit: 2015-12-19 | Discharge: 2015-12-25 | DRG: 871 | Disposition: A | Discharge status: Skilled Nursing Facility | Payer: Medicare Other | Attending: Internal Medicine | Admitting: Internal Medicine

## 2015-12-19 ENCOUNTER — Encounter (HOSPITAL_COMMUNITY): Payer: Self-pay

## 2015-12-19 ENCOUNTER — Emergency Department (HOSPITAL_COMMUNITY): Payer: Medicare Other

## 2015-12-19 DIAGNOSIS — Z6841 Body Mass Index (BMI) 40.0 and over, adult: Secondary | ICD-10-CM | POA: Diagnosis not present

## 2015-12-19 DIAGNOSIS — I83009 Varicose veins of unspecified lower extremity with ulcer of unspecified site: Secondary | ICD-10-CM | POA: Diagnosis present

## 2015-12-19 DIAGNOSIS — J101 Influenza due to other identified influenza virus with other respiratory manifestations: Secondary | ICD-10-CM | POA: Diagnosis not present

## 2015-12-19 DIAGNOSIS — Z888 Allergy status to other drugs, medicaments and biological substances status: Secondary | ICD-10-CM | POA: Diagnosis not present

## 2015-12-19 DIAGNOSIS — E1165 Type 2 diabetes mellitus with hyperglycemia: Secondary | ICD-10-CM | POA: Diagnosis present

## 2015-12-19 DIAGNOSIS — Z7982 Long term (current) use of aspirin: Secondary | ICD-10-CM

## 2015-12-19 DIAGNOSIS — Z89511 Acquired absence of right leg below knee: Secondary | ICD-10-CM

## 2015-12-19 DIAGNOSIS — E872 Acidosis: Secondary | ICD-10-CM | POA: Diagnosis present

## 2015-12-19 DIAGNOSIS — J9600 Acute respiratory failure, unspecified whether with hypoxia or hypercapnia: Secondary | ICD-10-CM | POA: Diagnosis not present

## 2015-12-19 DIAGNOSIS — E1151 Type 2 diabetes mellitus with diabetic peripheral angiopathy without gangrene: Secondary | ICD-10-CM | POA: Diagnosis present

## 2015-12-19 DIAGNOSIS — D631 Anemia in chronic kidney disease: Secondary | ICD-10-CM | POA: Diagnosis not present

## 2015-12-19 DIAGNOSIS — J9621 Acute and chronic respiratory failure with hypoxia: Secondary | ICD-10-CM | POA: Diagnosis present

## 2015-12-19 DIAGNOSIS — R0902 Hypoxemia: Secondary | ICD-10-CM

## 2015-12-19 DIAGNOSIS — G4733 Obstructive sleep apnea (adult) (pediatric): Secondary | ICD-10-CM | POA: Diagnosis present

## 2015-12-19 DIAGNOSIS — J00 Acute nasopharyngitis [common cold]: Secondary | ICD-10-CM | POA: Diagnosis present

## 2015-12-19 DIAGNOSIS — J9601 Acute respiratory failure with hypoxia: Secondary | ICD-10-CM | POA: Diagnosis not present

## 2015-12-19 DIAGNOSIS — E662 Morbid (severe) obesity with alveolar hypoventilation: Secondary | ICD-10-CM | POA: Diagnosis present

## 2015-12-19 DIAGNOSIS — L03116 Cellulitis of left lower limb: Secondary | ICD-10-CM | POA: Diagnosis not present

## 2015-12-19 DIAGNOSIS — L97229 Non-pressure chronic ulcer of left calf with unspecified severity: Secondary | ICD-10-CM | POA: Diagnosis not present

## 2015-12-19 DIAGNOSIS — E11622 Type 2 diabetes mellitus with other skin ulcer: Secondary | ICD-10-CM | POA: Diagnosis present

## 2015-12-19 DIAGNOSIS — J1100 Influenza due to unidentified influenza virus with unspecified type of pneumonia: Secondary | ICD-10-CM | POA: Diagnosis not present

## 2015-12-19 DIAGNOSIS — J9622 Acute and chronic respiratory failure with hypercapnia: Secondary | ICD-10-CM | POA: Diagnosis not present

## 2015-12-19 DIAGNOSIS — Z79899 Other long term (current) drug therapy: Secondary | ICD-10-CM | POA: Diagnosis not present

## 2015-12-19 DIAGNOSIS — N183 Chronic kidney disease, stage 3 unspecified: Secondary | ICD-10-CM | POA: Diagnosis present

## 2015-12-19 DIAGNOSIS — I5032 Chronic diastolic (congestive) heart failure: Secondary | ICD-10-CM | POA: Diagnosis present

## 2015-12-19 DIAGNOSIS — T380X5A Adverse effect of glucocorticoids and synthetic analogues, initial encounter: Secondary | ICD-10-CM | POA: Diagnosis present

## 2015-12-19 DIAGNOSIS — Y95 Nosocomial condition: Secondary | ICD-10-CM | POA: Diagnosis present

## 2015-12-19 DIAGNOSIS — F329 Major depressive disorder, single episode, unspecified: Secondary | ICD-10-CM | POA: Diagnosis not present

## 2015-12-19 DIAGNOSIS — L97929 Non-pressure chronic ulcer of unspecified part of left lower leg with unspecified severity: Secondary | ICD-10-CM | POA: Diagnosis present

## 2015-12-19 DIAGNOSIS — J9620 Acute and chronic respiratory failure, unspecified whether with hypoxia or hypercapnia: Secondary | ICD-10-CM | POA: Diagnosis not present

## 2015-12-19 DIAGNOSIS — I7389 Other specified peripheral vascular diseases: Secondary | ICD-10-CM | POA: Diagnosis not present

## 2015-12-19 DIAGNOSIS — K219 Gastro-esophageal reflux disease without esophagitis: Secondary | ICD-10-CM | POA: Diagnosis not present

## 2015-12-19 DIAGNOSIS — Z794 Long term (current) use of insulin: Secondary | ICD-10-CM

## 2015-12-19 DIAGNOSIS — E1149 Type 2 diabetes mellitus with other diabetic neurological complication: Secondary | ICD-10-CM | POA: Diagnosis present

## 2015-12-19 DIAGNOSIS — J9602 Acute respiratory failure with hypercapnia: Secondary | ICD-10-CM | POA: Diagnosis not present

## 2015-12-19 DIAGNOSIS — I5041 Acute combined systolic (congestive) and diastolic (congestive) heart failure: Secondary | ICD-10-CM | POA: Diagnosis not present

## 2015-12-19 DIAGNOSIS — I130 Hypertensive heart and chronic kidney disease with heart failure and stage 1 through stage 4 chronic kidney disease, or unspecified chronic kidney disease: Secondary | ICD-10-CM | POA: Diagnosis present

## 2015-12-19 DIAGNOSIS — A419 Sepsis, unspecified organism: Secondary | ICD-10-CM | POA: Diagnosis not present

## 2015-12-19 DIAGNOSIS — R0602 Shortness of breath: Secondary | ICD-10-CM | POA: Diagnosis not present

## 2015-12-19 DIAGNOSIS — J1000 Influenza due to other identified influenza virus with unspecified type of pneumonia: Secondary | ICD-10-CM | POA: Diagnosis present

## 2015-12-19 DIAGNOSIS — E1122 Type 2 diabetes mellitus with diabetic chronic kidney disease: Secondary | ICD-10-CM | POA: Diagnosis present

## 2015-12-19 DIAGNOSIS — R05 Cough: Secondary | ICD-10-CM | POA: Diagnosis not present

## 2015-12-19 DIAGNOSIS — D649 Anemia, unspecified: Secondary | ICD-10-CM | POA: Diagnosis present

## 2015-12-19 DIAGNOSIS — I5033 Acute on chronic diastolic (congestive) heart failure: Secondary | ICD-10-CM | POA: Diagnosis present

## 2015-12-19 DIAGNOSIS — J111 Influenza due to unidentified influenza virus with other respiratory manifestations: Secondary | ICD-10-CM | POA: Diagnosis not present

## 2015-12-19 DIAGNOSIS — J189 Pneumonia, unspecified organism: Secondary | ICD-10-CM | POA: Diagnosis not present

## 2015-12-19 DIAGNOSIS — R062 Wheezing: Secondary | ICD-10-CM | POA: Diagnosis not present

## 2015-12-19 DIAGNOSIS — I509 Heart failure, unspecified: Secondary | ICD-10-CM | POA: Diagnosis not present

## 2015-12-19 DIAGNOSIS — R069 Unspecified abnormalities of breathing: Secondary | ICD-10-CM | POA: Diagnosis not present

## 2015-12-19 DIAGNOSIS — J9690 Respiratory failure, unspecified, unspecified whether with hypoxia or hypercapnia: Secondary | ICD-10-CM

## 2015-12-19 DIAGNOSIS — E0781 Sick-euthyroid syndrome: Secondary | ICD-10-CM | POA: Diagnosis present

## 2015-12-19 DIAGNOSIS — G8929 Other chronic pain: Secondary | ICD-10-CM | POA: Diagnosis not present

## 2015-12-19 DIAGNOSIS — E119 Type 2 diabetes mellitus without complications: Secondary | ICD-10-CM | POA: Diagnosis not present

## 2015-12-19 DIAGNOSIS — I87312 Chronic venous hypertension (idiopathic) with ulcer of left lower extremity: Secondary | ICD-10-CM | POA: Diagnosis not present

## 2015-12-19 LAB — CBC WITH DIFFERENTIAL/PLATELET
Basophils Absolute: 0 10*3/uL (ref 0.0–0.1)
Basophils Relative: 0 %
Eosinophils Absolute: 0.1 10*3/uL (ref 0.0–0.7)
Eosinophils Relative: 1 %
HEMATOCRIT: 40.1 % (ref 36.0–46.0)
HEMOGLOBIN: 11.4 g/dL — AB (ref 12.0–15.0)
LYMPHS ABS: 0.8 10*3/uL (ref 0.7–4.0)
LYMPHS PCT: 12 %
MCH: 26.3 pg (ref 26.0–34.0)
MCHC: 28.4 g/dL — AB (ref 30.0–36.0)
MCV: 92.6 fL (ref 78.0–100.0)
MONOS PCT: 10 %
Monocytes Absolute: 0.6 10*3/uL (ref 0.1–1.0)
NEUTROS ABS: 5.1 10*3/uL (ref 1.7–7.7)
NEUTROS PCT: 77 %
Platelets: 190 10*3/uL (ref 150–400)
RBC: 4.33 MIL/uL (ref 3.87–5.11)
RDW: 17 % — ABNORMAL HIGH (ref 11.5–15.5)
WBC: 6.6 10*3/uL (ref 4.0–10.5)

## 2015-12-19 LAB — BLOOD GAS, ARTERIAL
ACID-BASE EXCESS: 9.3 mmol/L — AB (ref 0.0–2.0)
ACID-BASE EXCESS: 8.8 mmol/L — AB (ref 0.0–2.0)
Acid-Base Excess: 8.8 mmol/L — ABNORMAL HIGH (ref 0.0–2.0)
BICARBONATE: 37.9 meq/L — AB (ref 20.0–24.0)
BICARBONATE: 36.6 meq/L — AB (ref 20.0–24.0)
Bicarbonate: 38.6 mEq/L — ABNORMAL HIGH (ref 20.0–24.0)
DRAWN BY: 441261
DRAWN BY: 295031
Delivery systems: POSITIVE
Delivery systems: POSITIVE
Drawn by: 232811
EXPIRATORY PAP: 5
Expiratory PAP: 5
FIO2: 1
FIO2: 0.4
FIO2: 0.3
INSPIRATORY PAP: 18
Inspiratory PAP: 15
MODE: POSITIVE
O2 SAT: 91.2 %
O2 SAT: 98.7 %
O2 Saturation: 99.3 %
PCO2 ART: 90.7 mmHg — AB (ref 35.0–45.0)
PCO2 ART: 72.9 mmHg — AB (ref 35.0–45.0)
PH ART: 7.324 — AB (ref 7.350–7.450)
PO2 ART: 75.7 mmHg — AB (ref 80.0–100.0)
PO2 ART: 132 mmHg — AB (ref 80.0–100.0)
Patient temperature: 98.6
Patient temperature: 37
Patient temperature: 99.5
TCO2: 35 mmol/L (ref 0–100)
TCO2: 36.5 mmol/L (ref 0–100)
TCO2: 33.8 mmol/L (ref 0–100)
pCO2 arterial: 77.6 mmHg (ref 35.0–45.0)
pH, Arterial: 7.31 — ABNORMAL LOW (ref 7.350–7.450)
pH, Arterial: 7.252 — ABNORMAL LOW (ref 7.350–7.450)
pO2, Arterial: 210 mmHg — ABNORMAL HIGH (ref 80.0–100.0)

## 2015-12-19 LAB — GLUCOSE, CAPILLARY
Glucose-Capillary: 113 mg/dL — ABNORMAL HIGH (ref 65–99)
Glucose-Capillary: 111 mg/dL — ABNORMAL HIGH (ref 65–99)
Glucose-Capillary: 140 mg/dL — ABNORMAL HIGH (ref 65–99)

## 2015-12-19 LAB — BASIC METABOLIC PANEL
Anion gap: 10 (ref 5–15)
BUN: 21 mg/dL — AB (ref 6–20)
CHLORIDE: 101 mmol/L (ref 101–111)
CO2: 38 mmol/L — AB (ref 22–32)
CREATININE: 1.34 mg/dL — AB (ref 0.44–1.00)
Calcium: 9.2 mg/dL (ref 8.9–10.3)
GFR calc Af Amer: 45 mL/min — ABNORMAL LOW (ref >60)
GFR calc non Af Amer: 39 mL/min — ABNORMAL LOW (ref >60)
Glucose, Bld: 120 mg/dL — ABNORMAL HIGH (ref 65–99)
Potassium: 4.6 mmol/L (ref 3.5–5.1)
SODIUM: 149 mmol/L — AB (ref 135–145)

## 2015-12-19 LAB — TSH: TSH: 0.459 u[IU]/mL (ref 0.350–4.500)

## 2015-12-19 LAB — APTT: aPTT: 35 seconds (ref 24–37)

## 2015-12-19 LAB — URINALYSIS, ROUTINE W REFLEX MICROSCOPIC
BILIRUBIN URINE: NEGATIVE
Glucose, UA: NEGATIVE mg/dL
HGB URINE DIPSTICK: NEGATIVE
Ketones, ur: NEGATIVE mg/dL
Leukocytes, UA: NEGATIVE
Nitrite: NEGATIVE
PH: 8 (ref 5.0–8.0)
Protein, ur: NEGATIVE mg/dL
SPECIFIC GRAVITY, URINE: 1.011 (ref 1.005–1.030)

## 2015-12-19 LAB — LACTIC ACID, PLASMA
LACTIC ACID, VENOUS: 0.8 mmol/L (ref 0.5–2.0)
LACTIC ACID, VENOUS: 0.7 mmol/L (ref 0.5–2.0)

## 2015-12-19 LAB — BRAIN NATRIURETIC PEPTIDE: B Natriuretic Peptide: 36.7 pg/mL (ref 0.0–100.0)

## 2015-12-19 LAB — INFLUENZA PANEL BY PCR (TYPE A & B)
H1N1FLUPCR: NOT DETECTED
INFLAPCR: POSITIVE — AB
INFLBPCR: NEGATIVE

## 2015-12-19 LAB — MRSA PCR SCREENING: MRSA BY PCR: POSITIVE — AB

## 2015-12-19 LAB — I-STAT CG4 LACTIC ACID, ED: LACTIC ACID, VENOUS: 0.72 mmol/L (ref 0.5–2.0)

## 2015-12-19 LAB — PROCALCITONIN

## 2015-12-19 MED ORDER — ENOXAPARIN SODIUM 40 MG/0.4ML ~~LOC~~ SOLN: 40.0000 mg | SUBCUTANEOUS | Status: DC

## 2015-12-19 MED ORDER — MUPIROCIN 2 % EX OINT: 1.0000 "application " | TOPICAL_OINTMENT | Freq: Two times a day (BID) | CUTANEOUS | Status: DC

## 2015-12-19 MED ORDER — CHLORHEXIDINE GLUCONATE CLOTH 2 % EX PADS
6.0000 | MEDICATED_PAD | Freq: Every day | CUTANEOUS | Status: AC
  Administered 2015-12-20 – 2015-12-23 (×4): 6 via TOPICAL

## 2015-12-19 MED ORDER — INSULIN ASPART 100 UNIT/ML ~~LOC~~ SOLN
0.0000 [IU] | SUBCUTANEOUS | Status: DC
  Administered 2015-12-19: 1 [IU] via SUBCUTANEOUS
  Administered 2015-12-20 (×5): 2 [IU] via SUBCUTANEOUS
  Administered 2015-12-21: 1 [IU] via SUBCUTANEOUS
  Administered 2015-12-21: 5 [IU] via SUBCUTANEOUS
  Administered 2015-12-21: 7 [IU] via SUBCUTANEOUS
  Administered 2015-12-21 (×2): 2 [IU] via SUBCUTANEOUS
  Administered 2015-12-21: 3 [IU] via SUBCUTANEOUS
  Administered 2015-12-22 (×2): 5 [IU] via SUBCUTANEOUS

## 2015-12-19 MED ORDER — CHLORHEXIDINE GLUCONATE CLOTH 2 % EX PADS: 6.0000 | MEDICATED_PAD | Freq: Every day | CUTANEOUS | Status: DC

## 2015-12-19 MED ORDER — HEPARIN SODIUM (PORCINE) 5000 UNIT/ML IJ SOLN
5000.0000 [IU] | Freq: Three times a day (TID) | INTRAMUSCULAR | Status: DC
  Administered 2015-12-19 – 2015-12-22 (×10): 5000 [IU] via SUBCUTANEOUS
  Filled 2015-12-19 (×10): qty 1

## 2015-12-19 MED ORDER — IPRATROPIUM-ALBUTEROL 0.5-2.5 (3) MG/3ML IN SOLN
3.0000 mL | RESPIRATORY_TRACT | Status: DC
  Administered 2015-12-19 – 2015-12-22 (×19): 3 mL via RESPIRATORY_TRACT
  Filled 2015-12-19 (×18): qty 3

## 2015-12-19 MED ORDER — ASPIRIN 81 MG PO CHEW: 324.0000 mg | CHEWABLE_TABLET | ORAL | Status: AC

## 2015-12-19 MED ORDER — VANCOMYCIN HCL 10 G IV SOLR
2500.0000 mg | Freq: Once | INTRAVENOUS | Status: AC
  Administered 2015-12-19: 2500 mg via INTRAVENOUS
  Filled 2015-12-19: qty 2500

## 2015-12-19 MED ORDER — GABAPENTIN 300 MG PO CAPS
600.0000 mg | ORAL_CAPSULE | Freq: Two times a day (BID) | ORAL | Status: DC
  Administered 2015-12-19 – 2015-12-25 (×12): 600 mg via ORAL
  Filled 2015-12-19 (×12): qty 2

## 2015-12-19 MED ORDER — ALBUTEROL SULFATE (2.5 MG/3ML) 0.083% IN NEBU
2.5000 mg | INHALATION_SOLUTION | RESPIRATORY_TRACT | Status: DC | PRN
  Administered 2015-12-21: 2.5 mg via RESPIRATORY_TRACT
  Filled 2015-12-19 (×2): qty 3

## 2015-12-19 MED ORDER — ONDANSETRON HCL 4 MG/2ML IJ SOLN: 4.0000 mg | Freq: Four times a day (QID) | INTRAMUSCULAR | Status: DC | PRN

## 2015-12-19 MED ORDER — ONDANSETRON HCL 4 MG PO TABS: 4.0000 mg | ORAL_TABLET | Freq: Four times a day (QID) | ORAL | Status: DC | PRN

## 2015-12-19 MED ORDER — MUPIROCIN 2 % EX OINT
1.0000 "application " | TOPICAL_OINTMENT | Freq: Two times a day (BID) | CUTANEOUS | Status: AC
  Administered 2015-12-19 – 2015-12-24 (×10): 1 via NASAL
  Filled 2015-12-19 (×2): qty 22

## 2015-12-19 MED ORDER — SODIUM CHLORIDE 0.9 % IV SOLN: Freq: Once | INTRAVENOUS | Status: DC

## 2015-12-19 MED ORDER — DEXTROSE 5 % IV SOLN
1.0000 g | Freq: Three times a day (TID) | INTRAVENOUS | Status: DC
  Administered 2015-12-19 – 2015-12-20 (×2): 1 g via INTRAVENOUS
  Filled 2015-12-19 (×3): qty 1

## 2015-12-19 MED ORDER — VANCOMYCIN HCL 10 G IV SOLR
1750.0000 mg | INTRAVENOUS | Status: DC
  Filled 2015-12-19: qty 1750

## 2015-12-19 MED ORDER — SODIUM CHLORIDE 0.9 % IV SOLN: 250.0000 mL | INTRAVENOUS | Status: DC | PRN

## 2015-12-19 MED ORDER — ASPIRIN 300 MG RE SUPP
300.0000 mg | RECTAL | Status: AC
  Administered 2015-12-19: 300 mg via RECTAL
  Filled 2015-12-19: qty 1

## 2015-12-19 MED ORDER — DEXTROSE 5 % IV SOLN
2.0000 g | Freq: Once | INTRAVENOUS | Status: AC
  Administered 2015-12-19: 2 g via INTRAVENOUS
  Filled 2015-12-19: qty 2

## 2015-12-19 MED ORDER — MOMETASONE FURO-FORMOTEROL FUM 200-5 MCG/ACT IN AERO
2.0000 | INHALATION_SPRAY | Freq: Two times a day (BID) | RESPIRATORY_TRACT | Status: DC
  Filled 2015-12-19: qty 8.8

## 2015-12-19 MED ORDER — FAMOTIDINE IN NACL 20-0.9 MG/50ML-% IV SOLN: 20.0000 mg | Freq: Two times a day (BID) | INTRAVENOUS | Status: DC

## 2015-12-19 MED ORDER — METHYLPREDNISOLONE SODIUM SUCC 125 MG IJ SOLR
60.0000 mg | Freq: Four times a day (QID) | INTRAMUSCULAR | Status: DC
  Administered 2015-12-19 – 2015-12-20 (×3): 60 mg via INTRAVENOUS
  Filled 2015-12-19 (×3): qty 2

## 2015-12-19 MED ORDER — SODIUM CHLORIDE 0.9 % IV BOLUS (SEPSIS)
500.0000 mL | Freq: Once | INTRAVENOUS | Status: AC
  Administered 2015-12-19: 500 mL via INTRAVENOUS

## 2015-12-19 MED ORDER — IPRATROPIUM-ALBUTEROL 0.5-2.5 (3) MG/3ML IN SOLN
3.0000 mL | Freq: Four times a day (QID) | RESPIRATORY_TRACT | Status: DC
Start: 2015-12-19 — End: 2015-12-19

## 2015-12-19 MED ORDER — ACETAMINOPHEN 325 MG PO TABS
650.0000 mg | ORAL_TABLET | Freq: Once | ORAL | Status: AC
  Administered 2015-12-19: 650 mg via ORAL
  Filled 2015-12-19: qty 2

## 2015-12-19 MED ORDER — PANTOPRAZOLE SODIUM 40 MG PO TBEC
40.0000 mg | DELAYED_RELEASE_TABLET | Freq: Every day | ORAL | Status: DC
Start: 2015-12-19 — End: 2015-12-26
  Administered 2015-12-20 – 2015-12-25 (×6): 40 mg via ORAL
  Filled 2015-12-19 (×6): qty 1

## 2015-12-19 MED ORDER — FLUTICASONE PROPIONATE 50 MCG/ACT NA SUSP
2.0000 | Freq: Every day | NASAL | Status: DC
  Administered 2015-12-19 – 2015-12-25 (×7): 2 via NASAL
  Filled 2015-12-19: qty 16

## 2015-12-19 NOTE — Progress Notes (Signed)
eLink Physician-Brief Progress Note Patient Name: Heather Zimmerman. Heather Zimmerman DOB: [birth year] MRN: [id number]   Date of Service  12/19/2015  HPI/Events of Note  Camera check on patient with acute hypercarbic respiratory failure and clinical worsening on repeat ABG. Patient laying comfortably in bed. Opens eyes and responds with minimal stimulation from bedside nurse. Respiratory therapist at bedside adjusted BiPAP settings to increase minute ventilation to 12 L/m.  eICU Interventions  1. Repeat ABG at 1945 hour 2. Continue BiPAP at current settings 3. Low threshold for intubation although this will be an extremely difficult intubation     Intervention Category Major Interventions: Respiratory failure - evaluation and management  Tera Partridge 12/19/2015, 6:42 PM

## 2015-12-19 NOTE — Progress Notes (Signed)
Utilization Review completed.  Khiree Bukhari RN CM  

## 2015-12-19 NOTE — ED Notes (Signed)
Per EMS- Patient is a resident of [facility]. Patient c/o "head cold", Upon arrival EMS had room air sats -79%. Patient was placed on O2 4L/min via  and sats increased to 91%. Patient was then placed on a NRB-15L/min. Patient has a productive cough with green/brown sputum.

## 2015-12-19 NOTE — ED Notes (Signed)
EMS sats 91% with O2 4L/min via [place].
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
## 2015-12-19 NOTE — Progress Notes (Addendum)
Pharmacy Antibiotic Note  Heather Zimmerman. Heather Zimmerman is a 70 y.o. female admitted on 12/19/2015 with pneumonia.  Patient presented with headache, fever, productive cough and shortness of breath.  Pharmacy has been consulted for Vancomycin & Cefepime dosing.  Plan:  Vancomycin 2500mg  IV x 1 followed by 1750mg  IV q24h  Cefepime 2gm IV x 1 followed by 1gm IV q8h  F/U cultures & sensitivities  Check Vancomycin trough level when appropriate    Temp (24hrs), Avg:100.3 F (37.9 C), Min:99.3 F (37.4 C), Max:101.2 F (38.4 C)   Recent Labs Lab 12/19/15 1314 12/19/15 1326  WBC  --  6.6  CREATININE  --  1.34*  LATICACIDVEN 0.72  --     Estimated Creatinine Clearance: 51 mL/min (by C-G formula based on Cr of 1.34).    Allergies  Allergen Reactions  . Ace Inhibitors     unknown    Antimicrobials this admission: 3/7 Vanc >>   3/7 Cefepime >>    Dose adjustments this admission:   Goal: Vancomycin Trough : 15-20 mcg/ml  Microbiology results: 3/7 BCx:   3/7 UCx:     Thank you for allowing pharmacy to be a part of this patient's care.  Everette Rank, PharmD 12/19/2015 3:15 PM

## 2015-12-19 NOTE — Progress Notes (Signed)
Notified Elink of ABG results.  

## 2015-12-19 NOTE — ED Notes (Signed)
Bed: [id number] Expected date:  Expected time:  Means of arrival:  Comments: Respiratory distress

## 2015-12-19 NOTE — H&P (Addendum)
PULMONARY / CRITICAL CARE MEDICINE   Name: Heather Zimmerman MRN: [id number] DOB: [birth year]    ADMISSION DATE:  12/19/2015 CONSULTATION DATE:  3/7  REFERRING MD:  ER, Dr Jeneen Rinks  CHIEF COMPLAINT:  Acute hypercapnic resp failure  HISTORY OF PRESENT ILLNESS:  She presents for evaluation via EMS from [facility]. Chief complaint is headache, fever, shortness of breath. Describes about a 3 or four-day illness starting with a "head cold". States now she has a cough productive of some green and brown sputum and was more short of breath today. Is typically on 2 L nasal cannula. She arrived with some desaturation. ABg showed acute on chronic resp acidosis. BIPAP was started. She is awake, she describes weakness. Denies flu exposure in NH visitors , did receive her vaccination. She is awake and speaking full sentences. Denies CP, N/V/ Abdo pain. hospitalist was called but requested appropriately pccm evaluation.  PAST MEDICAL HISTORY :  She  has a past medical history of Type II or unspecified type diabetes mellitus with peripheral circulatory disorders, not stated as uncontrolled(250.70) (12/31/2012); Essential hypertension, benign (12/31/2012); Chronic diastolic heart failure ([place]) (12/31/2012); GERD (12/31/2012); Unspecified vitamin D deficiency (12/31/2012); Chronic pain (12/31/2012); Venous stasis ulcers ([place]); Venous insufficiency (chronic) (peripheral); Diabetes mellitus without complication ([place]); and Shortness of breath dyspnea.  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  Allergies  Allergen Reactions  . Ace Inhibitors     unknown    No current facility-administered medications on file prior to encounter.   Current Outpatient Prescriptions on File Prior to Encounter  Medication Sig  . acetaminophen (TYLENOL) 650 MG CR tablet Take 650 mg by mouth every 6 (six) hours as needed for  pain.  Marland Kitchen antiseptic oral rinse (BIOTENE) LIQD 15 mLs by Mouth Rinse route as needed for dry mouth.  Marland Kitchen aspirin 81 MG tablet Take 81 mg by mouth daily.  . camphor-menthol (SARNA) lotion Apply topically as needed for itching.  . cetirizine-pseudoephedrine (ZYRTEC-D) 5-120 MG per tablet Take 1 tablet by mouth 2 (two) times daily.  . cholecalciferol (VITAMIN D) 1000 UNITS tablet Take 2,000 Units by mouth daily.  . DULoxetine (CYMBALTA) 20 MG capsule Take 40 mg by mouth daily.   . Fluticasone Propionate (FLONASE NA) Place 2 sprays into the nose at bedtime. For nasal congestion  . Fluticasone-Salmeterol (ADVAIR) 250-50 MCG/DOSE AEPB Inhale 1 puff into the lungs daily.   Marland Kitchen gabapentin (NEURONTIN) 600 MG tablet Take 600 mg by mouth 2 (two) times daily.  Marland Kitchen HYDROcodone-acetaminophen (NORCO/VICODIN) 5-325 MG tablet Take 1 tablet by mouth every 4 (four) hours as needed for moderate pain or severe pain.  Marland Kitchen insulin aspart (NOVOLOG) 100 UNIT/ML injection Inject 5 Units into the skin 3 (three) times daily before meals. With an additional 5 units for cg >=150  . Insulin Glargine (LANTUS SOLOSTAR) 100 UNIT/ML Solostar Pen Inject 26 Units into the skin daily at 10 pm.   . ipratropium-albuterol (DUONEB) 0.5-2.5 (3) MG/3ML SOLN Take 3 mLs by nebulization every 6 (six) hours as needed (shortness of breath).  . magnesium oxide (MAG-OX) 400 MG tablet Take 400 mg by mouth every 8 (eight) hours.   Marland Kitchen omeprazole (PRILOSEC) 20 MG capsule Take 20 mg by mouth daily.  . potassium chloride (K-DUR,KLOR-CON) 10 MEQ tablet Take 20 mEq by mouth daily.  Marland Kitchen torsemide (DEMADEX) 20 MG tablet Take 1 tablet (20 mg total) by mouth 2 (two)  times daily.    FAMILY HISTORY:  Her has no family status information on file.  she is unable to voice her family history  SOCIAL HISTORY: She  reports that she has never smoked. She has never used smokeless tobacco. She reports that she does not drink alcohol or use illicit drugs.  REVIEW OF SYSTEMS:    She cannot provide, intermittent WOB increase without ability to direct a long history  SUBJECTIVE:  Denies SOB now On BIPAP  VITAL SIGNS: BP 146/73 mmHg  Pulse 91  Temp(Src) 101.2 F (38.4 C) (Rectal)  Resp 22  SpO2 97%  HEMODYNAMICS:    VENTILATOR SETTINGS: Vent Mode:  [-]  FiO2 (%):  [40 %] 40 %  INTAKE / OUTPUT:    PHYSICAL EXAMINATION: General:  On NIMV, increase WOB Neuro:  Alert, O x 3 HEENT: Obese neck, jvd unable to note, no stridor Cardiovascular:  s1 s2 RRR distant Lungs:  Distant Abdomen:  Soft, massive obesity, BS low, no r/g Musculoskeletal:  Rt aka, wound wrapped left lower dry Skin:  Wound left lower ulcerations, chronic  LABS:  BMET  Recent Labs Lab 12/19/15 1326  NA 149*  K 4.6  CL 101  CO2 38*  BUN 21*  CREATININE 1.34*  GLUCOSE 120*    Electrolytes  Recent Labs Lab 12/19/15 1326  CALCIUM 9.2    CBC  Recent Labs Lab 12/19/15 1326  WBC 6.6  HGB 11.4*  HCT 40.1  PLT 190    Coag's No results for input(s): APTT, INR in the last 168 hours.  Sepsis Markers  Recent Labs Lab 12/19/15 1314  LATICACIDVEN 0.72    ABG  Recent Labs Lab 12/19/15 1306  PHART 7.310*  PCO2ART 77.6*  PO2ART 210*    Liver Enzymes No results for input(s): AST, ALT, ALKPHOS, BILITOT, ALBUMIN in the last 168 hours.  Cardiac Enzymes No results for input(s): TROPONINI, PROBNP in the last 168 hours.  Glucose No results for input(s): GLUCAP in the last 168 hours.  Imaging Dg Chest Port 1 View  12/19/2015  CLINICAL DATA:  Shortness of breath and productive cough congestion. History of COPD. EXAM: PORTABLE CHEST 1 VIEW COMPARISON:  Single-view of the chest 10/05/2015 and 06/27/2014. FINDINGS: There is cardiomegaly and interstitial pulmonary edema. No pneumothorax or pleural effusion. IMPRESSION: Cardiomegaly and interstitial edema. Electronically Signed   By: Inge Rise M.D.   On: 12/19/2015 13:27     STUDIES:  pcxr 3/7- diffuse  int prominence  CULTURES: Sputum 3/7>>> Flu>>> BC 3/7>>>  ANTIBIOTICS: ceftaz 3/7>>> vanc 3/7>>>   SIGNIFICANT EVENTS: 3/7- resp failure, NIMV, fever  LINES/TUBES:   ASSESSMENT / PLAN:  PULMONARY A: HCAP, r/o component diastolic HF P:   kvo NIMV, repeat abg in 2-3 hours, appears to be tolerating NIMV well Repeat pcxr in am  Low threshold for lasix, given int changes She is speaking full sentences now and tolerating NIMV, her initial ABG is mild co2 retention from baseline thus far  CARDIOVASCULAR A:  R/o Acute diastolic HF exacerbation  P:  Low threshold lasix Tele kvo at minimum  RENAL A:   R/o edema P:   kvo Chem in am  May need diuresis  GASTROINTESTINAL A:   Super Morbid Obesity P:   Npo for distress Add ppi , home prilosec  HEMATOLOGIC A:   DVT prevention P:  Add sub q hep Cbc in am   INFECTIOUS A:   HCAP P:   Cefepime, vanc Get sputum BC Pct assessment trend,  may be able to limit ABX duration Isolate, get flu ag  ENDOCRINE A:   DM, obesity  P:   ssi tsh  NEUROLOGIC A:   NIMV P:   RASS goal: 0 Limit ANY benzo If needed add low dose fent   FAMILY  - Updates: to pt, no family  - Inter-disciplinary family meet or Palliative Care meeting due by: 3/14  Ccm time 35 min   Lavon Paganini. Titus Mould, MD, [place] Pgr: [facility]  Pulmonary and [place] Pager: [phone]  12/19/2015, 3:24 PM

## 2015-12-19 NOTE — ED Notes (Signed)
Resp called for transportation to assigned room due to Bipap

## 2015-12-19 NOTE — Progress Notes (Addendum)
eLink Physician-Brief Progress Note Patient Name: Heather Zimmerman DOB: 12-07-1945 MRN: [id number]   Date of Service  12/19/2015  HPI/Events of Note  Repeat ABG reportedly shows improving ventilation. Patient continuing on DuoNeb every 4 hours & Solu-Medrol IV every 6.  eICU Interventions  1. Close Monitoring for mental status changes 2. Placed second peripheral IV 3. Stat VBG to provide baseline for further monitoring     Intervention Category Major Interventions: Respiratory failure - evaluation and management  Tera Partridge 12/19/2015, 8:25 PM

## 2015-12-20 DIAGNOSIS — I5041 Acute combined systolic (congestive) and diastolic (congestive) heart failure: Secondary | ICD-10-CM

## 2015-12-20 DIAGNOSIS — J101 Influenza due to other identified influenza virus with other respiratory manifestations: Secondary | ICD-10-CM

## 2015-12-20 LAB — CBC
HEMATOCRIT: 38 % (ref 36.0–46.0)
Hemoglobin: 11.2 g/dL — ABNORMAL LOW (ref 12.0–15.0)
MCH: 27.2 pg (ref 26.0–34.0)
MCHC: 29.5 g/dL — ABNORMAL LOW (ref 30.0–36.0)
MCV: 92.2 fL (ref 78.0–100.0)
PLATELETS: 183 10*3/uL (ref 150–400)
RBC: 4.12 MIL/uL (ref 3.87–5.11)
RDW: 17 % — ABNORMAL HIGH (ref 11.5–15.5)
WBC: 8.2 10*3/uL (ref 4.0–10.5)

## 2015-12-20 LAB — BLOOD GAS, VENOUS
Acid-Base Excess: 10.6 mmol/L — ABNORMAL HIGH (ref 0.0–2.0)
BICARBONATE: 40 meq/L — AB (ref 20.0–24.0)
DELIVERY SYSTEMS: POSITIVE
EXPIRATORY PAP: 5
FIO2: 0.25
INSPIRATORY PAP: 18
O2 SAT: 69.9 %
PATIENT TEMPERATURE: 99.5
PCO2 VEN: 89.9 mmHg — AB (ref 45.0–50.0)
PH VEN: 7.274 (ref 7.250–7.300)
PO2 VEN: 42.8 mmHg (ref 31.0–45.0)
TCO2: 37.8 mmol/L (ref 0–100)

## 2015-12-20 LAB — BASIC METABOLIC PANEL
Anion gap: 8 (ref 5–15)
BUN: 22 mg/dL — ABNORMAL HIGH (ref 6–20)
CHLORIDE: 100 mmol/L — AB (ref 101–111)
CO2: 35 mmol/L — AB (ref 22–32)
CREATININE: 1.33 mg/dL — AB (ref 0.44–1.00)
Calcium: 8.7 mg/dL — ABNORMAL LOW (ref 8.9–10.3)
GFR calc non Af Amer: 39 mL/min — ABNORMAL LOW (ref >60)
GFR, EST AFRICAN AMERICAN: 46 mL/min — AB (ref >60)
Glucose, Bld: 169 mg/dL — ABNORMAL HIGH (ref 65–99)
POTASSIUM: 4.8 mmol/L (ref 3.5–5.1)
Sodium: 143 mmol/L (ref 135–145)

## 2015-12-20 LAB — BLOOD GAS, ARTERIAL
ACID-BASE EXCESS: 8.2 mmol/L — AB (ref 0.0–2.0)
BICARBONATE: 35.6 meq/L — AB (ref 20.0–24.0)
DRAWN BY: 308601
Delivery systems: POSITIVE
Expiratory PAP: 5
FIO2: 0.25
Inspiratory PAP: 18
MODE: POSITIVE
O2 SAT: 86.6 %
PATIENT TEMPERATURE: 98.6
PCO2 ART: 67.9 mmHg — AB (ref 35.0–45.0)
PH ART: 7.34 — AB (ref 7.350–7.450)
PO2 ART: 55 mmHg — AB (ref 80.0–100.0)
TCO2: 32.9 mmol/L (ref 0–100)

## 2015-12-20 LAB — GLUCOSE, CAPILLARY
GLUCOSE-CAPILLARY: 152 mg/dL — AB (ref 65–99)
GLUCOSE-CAPILLARY: 177 mg/dL — AB (ref 65–99)
GLUCOSE-CAPILLARY: 191 mg/dL — AB (ref 65–99)
GLUCOSE-CAPILLARY: 164 mg/dL — AB (ref 65–99)
Glucose-Capillary: 195 mg/dL — ABNORMAL HIGH (ref 65–99)
Glucose-Capillary: 187 mg/dL — ABNORMAL HIGH (ref 65–99)

## 2015-12-20 LAB — PHOSPHORUS: PHOSPHORUS: 3.6 mg/dL (ref 2.5–4.6)

## 2015-12-20 LAB — EXPECTORATED SPUTUM ASSESSMENT W GRAM STAIN, RFLX TO RESP C: Special Requests: NORMAL

## 2015-12-20 LAB — PROCALCITONIN: Procalcitonin: <0.1 ng/mL

## 2015-12-20 LAB — TSH: TSH: 0.305 u[IU]/mL — ABNORMAL LOW (ref 0.350–4.500)

## 2015-12-20 LAB — STREP PNEUMONIAE URINARY ANTIGEN: Strep Pneumo Urinary Antigen: NEGATIVE

## 2015-12-20 LAB — EXPECTORATED SPUTUM ASSESSMENT W REFEX TO RESP CULTURE

## 2015-12-20 LAB — MAGNESIUM: MAGNESIUM: 2.4 mg/dL (ref 1.7–2.4)

## 2015-12-20 MED ORDER — OSELTAMIVIR PHOSPHATE 30 MG PO CAPS
30.0000 mg | ORAL_CAPSULE | Freq: Two times a day (BID) | ORAL | Status: AC
Start: 2015-12-20 — End: 2015-12-24
  Administered 2015-12-20 – 2015-12-24 (×10): 30 mg via ORAL
  Filled 2015-12-20 (×12): qty 1

## 2015-12-20 MED ORDER — FUROSEMIDE 10 MG/ML IJ SOLN
40.0000 mg | Freq: Four times a day (QID) | INTRAMUSCULAR | Status: AC
  Administered 2015-12-20 (×2): 40 mg via INTRAVENOUS
  Filled 2015-12-20 (×2): qty 4

## 2015-12-20 MED ORDER — METHYLPREDNISOLONE SODIUM SUCC 125 MG IJ SOLR
60.0000 mg | Freq: Two times a day (BID) | INTRAMUSCULAR | Status: DC
  Administered 2015-12-20 – 2015-12-22 (×4): 60 mg via INTRAVENOUS
  Filled 2015-12-20 (×4): qty 2

## 2015-12-20 MED ORDER — GUAIFENESIN ER 600 MG PO TB12
600.0000 mg | ORAL_TABLET | Freq: Two times a day (BID) | ORAL | Status: DC
Start: 2015-12-20 — End: 2015-12-25
  Administered 2015-12-21 – 2015-12-24 (×9): 600 mg via ORAL
  Filled 2015-12-20 (×9): qty 1

## 2015-12-20 NOTE — Consult Note (Signed)
WOC wound consult note Reason for Consult:Chronic venous stasis ulcers to LLE, rt BKA, intact Wound type:Chronic venous Pressure Ulcer POA: N/A Measurement:Multiple full thickness wounds to left lower extremity with denuded areas. Posterior leg wound measures 3 cm x 2 cm x 0.2 cm, left lowe leg wound measures 1.5 cm x 3.1 cm x 0.2 cm. Total measurement is 19 cm x 25 cm circumferentially  Wound bed: Pink and moist, bleeding noted with cleaning Drainage (amount, consistency, odor) minimal serosanguinous  Periwound: Chronic skin changes Dressing procedure/placement/frequency: Clean left lower leg with normal saline, pat dry, cover open areas with xeroform gauze, cover with zinc layer from below toes to below knee, and secure with coban. Change weekly   Re consult if needed, will not follow at this time. Thanks, Melba Coon MSN, RN, [employer]

## 2015-12-20 NOTE — Clinical Social Work Note (Signed)
Clinical Social Work Assessment  Patient Details  Name: Heather Zimmerman MRN: [id number] Date of Birth: August 02, 1946  Date of referral:  12/20/15               Reason for consult:  Facility Placement, Discharge Planning                Permission sought to share information with:  Facility Art therapist granted to share information::  Yes, Verbal Permission Granted  Name::        Agency::     Relationship::     Contact Information:     Housing/Transportation Living arrangements for the past 2 months:  [place] of Information:  Patient, Facility Patient Interpreter Needed:  None Criminal Activity/Legal Involvement Pertinent to Current Situation/Hospitalization:  No - Comment as needed Significant Relationships:  Other Family Members Lives with:  Facility Resident Do you feel safe going back to the place where you live?  Yes Need for family participation in patient care:  No (Coment)  Care giving concerns:  No concerns reported at this time.   Social Worker assessment / plan:  Pt hospitalized on 12/19/15 with acute on chronic respiratory failure with hypercapnea due to influenza B. Pt is a LTC resident from [facility]. CSW met with pt to assist with d/c planning. Pt reports that she would like to return to SNF at d/c. Clinicals have been sent to [facility] and d/c plan confirmed. Pt requested CSW to contact her family to let them know she is in the hospital. Pt's brother, Kathlene Cote [phone], has been contacted and will visit with pt this afternoon. CSW will continue to follow to assist with d/c planning to SNF.  Employment status:  Retired Forensic scientist:  Information systems manager, Medicaid In [place] PT Recommendations:  Not assessed at this time [place] / Referral to community resources:     Patient/Family's Response to care:  Pt plans to return to [facility] at d/c.  Patient/Family's Understanding of and Emotional  Response to Diagnosis, Current Treatment, and Prognosis:   Pt reports , " I'm not feeling that good. I want my family to know I'm here." Pt's brother contacted and is aware pt is hospitalized. Support / reassurance provided.  Emotional Assessment Appearance:    Attitude/Demeanor/Rapport:  Other (cooperative) Affect (typically observed):  Calm, Appropriate, Pleasant Orientation:  Oriented to Self, Oriented to Place, Oriented to  Time, Oriented to Situation Alcohol / Substance use:  Not Applicable Psych involvement (Current and /or in the community):  No (Comment)  Discharge Needs  Concerns to be addressed:  Discharge Planning Concerns Readmission within the last 30 days:  No Current discharge risk:  None Barriers to Discharge:  No Barriers Identified   Luretha Rued, [place] 12/20/2015, 2:54 PM

## 2015-12-20 NOTE — Progress Notes (Signed)
PULMONARY / CRITICAL CARE MEDICINE   Name: Heather Zimmerman MRN: [id number] DOB: Jun 13, 1946    ADMISSION DATE:  12/19/2015 CONSULTATION DATE:  3/7  REFERRING MD:  ER, Dr Jeneen Rinks  CHIEF COMPLAINT:  Acute hypercapnic resp failure  Significant Studies:  06/21/14: Echo  EF 60-65%  Study Conclusions  - Left ventricle: The cavity size was normal. Systolic function was normal. The estimated ejection fraction was in the range of 60% to 65%. Wall motion was normal; there were no regional wall motion abnormalities. Doppler parameters are consistent with abnormal left ventricular relaxation (grade 1 diastolic dysfunction). - Right ventricle: The cavity size was mildly dilated. Wall thickness was normal.  REVIEW OF SYSTEMS:   Constitutional:  No night sweats,  Fevers, chills,+ fatigue, or  lassitude.  HEENT:   No headaches,  Difficulty swallowing,  Tooth/dental problems, or  Sore throat,   No sneezing, itching, ear ache, nasal congestion, post nasal drip,   CV:  No chest pain,  Orthopnea, PND, swelling in lower extremities ( R BKA) anasarca, dizziness, palpitations, syncope.   GI  No heartburn, indigestion, abdominal pain, nausea, vomiting, diarrhea, change in bowel habits, loss of appetite, bloody stools.   Resp: denies shortness of breath  at rest.  + excess mucus, + productive cough,  No non-productive cough,  No coughing up of blood.  + change in color of mucus.  No wheezing.  No chest wall deformity  Skin: no rash or lesions.  GU: no dysuria, change in color of urine, no urgency or frequency.  No flank pain, no hematuria   MS:  No joint pain or swelling.  No decreased range of motion.  No back pain.  Psych:  No change in mood or affect. No depression or anxiety.  No memory loss.       SUBJECTIVE:  States less SOB On BIPAP with short term breaks on [place] at 2L. Requesting something to [place]: BP 127/50 mmHg  Pulse 80  Temp(Src) 98 F (36.7 C)  (Axillary)  Resp 26  Ht 4\' 9"  (1.448 m)  Wt 328 lb 11.3 oz (149.1 kg)  BMI 71.11 kg/m2  SpO2 94%  HEMODYNAMICS:  Hemodynamically stable  VENTILATOR SETTINGS: Vent Mode:  [-]  FiO2 (%):  [25 %-40 %] 30 %  INTAKE / OUTPUT: I/O last 3 completed shifts: In: 710 [P.O.:50; I.V.:10; IV Piggyback:650] Out: -   PHYSICAL EXAMINATION: General:  Currently on 2L [place] for short break from BiPap.( 1 hour or less) Neuro:  Alert, O x 3 HEENT: Obese neck, jvd unable to note, no stridor Cardiovascular:  s1 s2 RRR distant Lungs:  Distant, diminished per bases Abdomen:  Soft, massive obesity, BS low, no r/g Musculoskeletal:  Rt aka, wound wrapped left lower dry, scant edema Skin:  Wound left lower ulcerations, chronic  LABS:  BMET  Recent Labs Lab 12/19/15 1326 12/20/15 0306  NA 149* 143  K 4.6 4.8  CL 101 100*  CO2 38* 35*  BUN 21* 22*  CREATININE 1.34* 1.33*  GLUCOSE 120* 169*    Electrolytes  Recent Labs Lab 12/19/15 1326 12/20/15 0306  CALCIUM 9.2 8.7*  MG  --  2.4  PHOS  --  3.6    CBC  Recent Labs Lab 12/19/15 1326 12/20/15 0306  WBC 6.6 8.2  HGB 11.4* 11.2*  HCT 40.1 38.0  PLT 190 183    Coag's  Recent Labs Lab 12/19/15 1727  APTT 35    Sepsis Markers  Recent Labs  Lab 12/19/15 1314 12/19/15 1622 12/19/15 1728 12/19/15 2030 12/20/15 0306  LATICACIDVEN 0.72  --  0.8 0.7  --   PROCALCITON  --  <0.10  --   --  <0.10    ABG  Recent Labs Lab 12/19/15 1628 12/19/15 2010 12/20/15 0404  PHART 7.252* 7.324* 7.340*  PCO2ART 90.7* 72.9* 67.9*  PO2ART 75.7* 132* 55.0*    Liver Enzymes No results for input(s): AST, ALT, ALKPHOS, BILITOT, ALBUMIN in the last 168 hours.  Cardiac Enzymes No results for input(s): TROPONINI, PROBNP in the last 168 hours.  Glucose  Recent Labs Lab 12/19/15 1717 12/19/15 2012 12/19/15 2314 12/20/15 0353 12/20/15 0735  GLUCAP 113* 111* 140* 152* 195*    Imaging Dg Chest Port 1 View  12/19/2015   CLINICAL DATA:  Shortness of breath and productive cough congestion. History of COPD. EXAM: PORTABLE CHEST 1 VIEW COMPARISON:  Single-view of the chest 10/05/2015 and 06/27/2014. FINDINGS: There is cardiomegaly and interstitial pulmonary edema. No pneumothorax or pleural effusion. IMPRESSION: Cardiomegaly and interstitial edema. Electronically Signed   By: Inge Rise M.D.   On: 12/19/2015 13:27     STUDIES:  pcxr 3/7- diffuse int prominence  CULTURES: Sputum 3/7>>> Flu>>>Flu A + BC 3/7>>>  ANTIBIOTICS: ceftaz 3/7>>> vanc 3/7>>> Tamiflu>>>12/20/15  SIGNIFICANT EVENTS: 3/7- resp failure, NIMV, fever 12/20/15: Afebrile since MN, CO2 down trending  LINES/TUBES: 12/19/15: PIV's x 2  ASSESSMENT / PLAN:  PULMONARY A: HCAP, r/o component diastolic HF P:   kvo NIMV with short breaks/ on at HS without fail Repeat pcxr today and in am  Evaluate need for Lasix based on todays CXR ( Pending) She is speaking full sentences and tolerating NIMV, her ABG remains slightly acidotic, not quite compensated. Continues to desaturate below 88% on 2L ( Baseline home oxygen) CARDIOVASCULAR A:  R/o Acute diastolic HF exacerbation  P:  Low threshold lasix Consider Echo if CXR continues to show interstitial edema to RO multifactorial etiology. Tele kvo at minimum  RENAL A:   R/o edema P:   kvo Strict I&O Chem in am  CXR today to determine need for diuresis  GASTROINTESTINAL A:   Super Morbid Obesity P:   Npo for distress May have few ice chips only while off BiPap Add ppi , home prilosec  HEMATOLOGIC A:   DVT prevention P:  Add sub q hep Cbc in am, trend WBC  INFECTIOUS A:   HCAP/ + Flu A P:   Cefepime, vanc Sputum collected 12/20/15 BC pending Pct assessment trend, may be able to limit ABX duration Isolate, get flu ag Start Tamiflu tx dose ENDOCRINE A:   DM, obesity  P:   ssi TSH slightly low ( 0.305 uIU/mL) Follow up with PCP for recheck upon  discharge.   NEUROLOGIC A:   NIMV P:   RASS goal: 0 Limit ANY benzo If needed add low dose fent   FAMILY  - No Family at bedside  - Inter-disciplinary family meet or Palliative Care meeting due by: 3/14    Magdalen Spatz, AGACNP-BC [place] Pager # [phone]  Pulmonary and [place] Pager: [phone]  12/20/2015, 9:37 AM

## 2015-12-20 NOTE — Progress Notes (Signed)
Pt is on NIV at this time tolerating it well no complications noted.

## 2015-12-21 ENCOUNTER — Inpatient Hospital Stay (HOSPITAL_COMMUNITY): Payer: Medicare Other

## 2015-12-21 DIAGNOSIS — J9601 Acute respiratory failure with hypoxia: Secondary | ICD-10-CM

## 2015-12-21 DIAGNOSIS — J1100 Influenza due to unidentified influenza virus with unspecified type of pneumonia: Secondary | ICD-10-CM

## 2015-12-21 DIAGNOSIS — J9602 Acute respiratory failure with hypercapnia: Secondary | ICD-10-CM

## 2015-12-21 DIAGNOSIS — R062 Wheezing: Secondary | ICD-10-CM

## 2015-12-21 LAB — PROCALCITONIN

## 2015-12-21 LAB — GLUCOSE, CAPILLARY
GLUCOSE-CAPILLARY: 145 mg/dL — AB (ref 65–99)
GLUCOSE-CAPILLARY: 222 mg/dL — AB (ref 65–99)
Glucose-Capillary: 162 mg/dL — ABNORMAL HIGH (ref 65–99)
Glucose-Capillary: 165 mg/dL — ABNORMAL HIGH (ref 65–99)

## 2015-12-21 LAB — ECHOCARDIOGRAM COMPLETE
Height: 57 in
Weight: 5118.2 oz

## 2015-12-21 LAB — URINE CULTURE: CULTURE: NO GROWTH

## 2015-12-21 MED ORDER — PERFLUTREN LIPID MICROSPHERE
INTRAVENOUS | Status: AC
  Filled 2015-12-21: qty 10

## 2015-12-21 NOTE — Progress Notes (Signed)
Echocardiogram 2D Echocardiogram with Definity has been performed.  Tresa Res 12/21/2015, 2:43 PM

## 2015-12-21 NOTE — Progress Notes (Signed)
Date:  December 21, 2015 Chart reviewed for concurrent status and case management needs. Will continue to follow patient for changes and needs: continues to require bipap at night weaning o2 requirements. Velva Harman, BSN, [place], [place]   [phone]

## 2015-12-21 NOTE — Progress Notes (Signed)
Pt doesn't tolerate back up rate well last night at 26bpm, pt stated to much air. I backed the set RR to 16 to patient toleartion. Pt tolerated it well . No complications or distress noted.

## 2015-12-21 NOTE — Progress Notes (Signed)
PULMONARY / CRITICAL CARE MEDICINE   Name: Heather Zimmerman MRN: [id number] DOB: 10-25-1945    ADMISSION DATE:  12/19/2015 CONSULTATION DATE:  3/7  REFERRING MD:  ER, Dr Jeneen Rinks  CHIEF COMPLAINT:  Acute hypercapnic resp failure  Significant Studies:  06/21/14: Echo  EF 60-65%  Study Conclusions  - Left ventricle: Systolic Fxn NML. Grade 1 diastolic dysfunction  -Right ventricle: The cavity size was mildly dilated. Wall thickness was normal.  REVIEW OF SYSTEMS:   Constitutional:  No night sweats,  Fevers, chills,+ fatigue, or  lassitude.  HEENT:   No headaches,  Difficulty swallowing,  Tooth/dental problems, or  Sore throat,   No sneezing, itching, ear ache, nasal congestion, post nasal drip, + hoarseness.  CV:  No chest pain,  Orthopnea, PND, swelling in lower extremities ( R BKA) anasarca, dizziness, palpitations, syncope.   GI  No heartburn, indigestion, abdominal pain, nausea, vomiting, diarrhea, change in bowel habits, loss of appetite, bloody stools.   Resp: denies shortness of breath  at rest.  + excess mucus, + productive cough,  No non-productive cough,  No coughing up of blood.  + change in color of mucus.  + wheezing.  Morbidly obese/ thick neck.  Skin: no rash or lesions.  GU: no dysuria, change in color of urine, no urgency or frequency.  No flank pain, no hematuria   MS:  No joint pain or swelling.  No decreased range of motion.  No back pain.  Psych:  No change in mood or affect. No depression or anxiety.  No memory loss.       SUBJECTIVE:  States less SOB, but is hoarse and feels she needs to cough up mucus. Currently on 2L [place]/ BiPAP HS Requesting something to eat/drink  VITAL SIGNS: BP 117/56 mmHg  Pulse 64  Temp(Src) 98.3 F (36.8 C) (Axillary)  Resp 17  Ht 4\' 9"  (1.448 m)  Wt 319 lb 14.2 oz (145.1 kg)  BMI 69.20 kg/m2  SpO2 98%  HEMODYNAMICS:  Hemodynamically stable  VENTILATOR SETTINGS: Vent Mode:  [-]  FiO2 (%):  [30 %] 30  %  INTAKE / OUTPUT: I/O last 3 completed shifts: In: 560 [P.O.:300; I.V.:160; IV Piggyback:100] Out: 900 [Urine:900]  PHYSICAL EXAMINATION: General: Morbidly obese female currently on oxygen at 2L [place] Neuro:  Alert, O x 3 HEENT: Obese neck, jvd unable to note, no stridor Cardiovascular:  s1 s2 RRR distant Lungs:  Distant, Expiratory wheezes throughout,diminished per bases Abdomen:  Soft, massive obesity, BS low, no r/g Musculoskeletal:  Rt aka, wound wrapped left lower dry, scant edema Skin:  Wound left lower ulcerations, chronic  LABS:  BMET  Recent Labs Lab 12/19/15 1326 12/20/15 0306  NA 149* 143  K 4.6 4.8  CL 101 100*  CO2 38* 35*  BUN 21* 22*  CREATININE 1.34* 1.33*  GLUCOSE 120* 169*    Electrolytes  Recent Labs Lab 12/19/15 1326 12/20/15 0306  CALCIUM 9.2 8.7*  MG  --  2.4  PHOS  --  3.6    CBC  Recent Labs Lab 12/19/15 1326 12/20/15 0306  WBC 6.6 8.2  HGB 11.4* 11.2*  HCT 40.1 38.0  PLT 190 183    Coag's  Recent Labs Lab 12/19/15 1727  APTT 35    Sepsis Markers  Recent Labs Lab 12/19/15 1314 12/19/15 1622 12/19/15 1728 12/19/15 2030 12/20/15 0306 12/21/15 0319  LATICACIDVEN 0.72  --  0.8 0.7  --   --   PROCALCITON  --  <0.10  --   --  <  0.10 <0.10    ABG  Recent Labs Lab 12/19/15 1628 12/19/15 2010 12/20/15 0404  PHART 7.252* 7.324* 7.340*  PCO2ART 90.7* 72.9* 67.9*  PO2ART 75.7* 132* 55.0*    Liver Enzymes No results for input(s): AST, ALT, ALKPHOS, BILITOT, ALBUMIN in the last 168 hours.  Cardiac Enzymes No results for input(s): TROPONINI, PROBNP in the last 168 hours.  Glucose  Recent Labs Lab 12/20/15 1205 12/20/15 1622 12/20/15 2007 12/21/15 0001 12/21/15 0447 12/21/15 0744  GLUCAP 191* 187* 164* 145* 162* 165*    Imaging No results found.   STUDIES:  pcxr 3/7- diffuse int prominence  CULTURES: Sputum 3/7>>> Flu>>>Flu A + BC 3/7>>> Urine 3/7>>> ANTIBIOTICS: ceftaz 3/7>>>12/20/15 vanc  3/7>>>12/20/15 Tamiflu>>>12/20/15  SIGNIFICANT EVENTS: 3/7- resp failure, NIMV, fever 12/20/15: Afebrile since MN, CO2 down trending as of 12/20/15 12/21/15: Afebrile LINES/TUBES: 12/19/15: PIV's x 2  ASSESSMENT / PLAN:  PULMONARY A: HCAP, r/o component diastolic HF P:   kvo 2 L [place] with BiPAP as needed. Mandatory BiPAP for HS More  Expiratory wheezing noted today. Net negative fluid balance ( -490) after Lasix 12/20/15. She is speaking full sentences and tolerating NIMV, voice is hoarse. Continues to desaturate below 88% on 2L with sleep ( Baseline home oxygen) Transfer to SDU and transfer care to [facility]. CARDIOVASCULAR A:  R/o Acute diastolic HF exacerbation  P:  Low threshold lasix Repeat Echo Transfer SDU kvo at minimum  RENAL A:   R/o edema P:   kvo Strict I&O Chem in am  CXR in am  GASTROINTESTINAL A:   Super Morbid Obesity P:    Start Clear Liquid diet, advance as tolerated to heart smart diet. Add ppi , home prilosec  HEMATOLOGIC A:   DVT prevention P:  Add sub q hep Cbc in am, trend WBC  INFECTIOUS A:   HCAP/ + Flu A P:   ABX d/c'd 12/20/15. Sputum collected 12/20/15 BC pending Isolate, get flu ag Start Tamiflu tx dose 12/20/15 ENDOCRINE A:   DM, obesity  P:   ssi TSH slightly low ( 0.305 uIU/mL) Follow up with PCP for recheck upon discharge. Sleep Study Eval/ CPAP at discharge  NEUROLOGIC A:   NIMV P:   RASS goal: 0 Limit ANY benzo    FAMILY  - No Family at bedside  - Inter-disciplinary family meet or Palliative Care meeting due by: 3/14    Magdalen Spatz, AGACNP-BC [place] Pager # [phone]  Pulmonary and [place] Pager: [phone]  12/21/2015, 8:33 AM

## 2015-12-22 ENCOUNTER — Inpatient Hospital Stay (HOSPITAL_COMMUNITY): Payer: Medicare Other

## 2015-12-22 DIAGNOSIS — N183 Chronic kidney disease, stage 3 (moderate): Secondary | ICD-10-CM

## 2015-12-22 DIAGNOSIS — I509 Heart failure, unspecified: Secondary | ICD-10-CM

## 2015-12-22 DIAGNOSIS — E1149 Type 2 diabetes mellitus with other diabetic neurological complication: Secondary | ICD-10-CM

## 2015-12-22 DIAGNOSIS — I130 Hypertensive heart and chronic kidney disease with heart failure and stage 1 through stage 4 chronic kidney disease, or unspecified chronic kidney disease: Secondary | ICD-10-CM

## 2015-12-22 DIAGNOSIS — I5033 Acute on chronic diastolic (congestive) heart failure: Secondary | ICD-10-CM

## 2015-12-22 DIAGNOSIS — J9622 Acute and chronic respiratory failure with hypercapnia: Secondary | ICD-10-CM

## 2015-12-22 LAB — CULTURE, RESPIRATORY W GRAM STAIN: Culture: NORMAL

## 2015-12-22 LAB — BASIC METABOLIC PANEL
Anion gap: 5 (ref 5–15)
BUN: 47 mg/dL — AB (ref 6–20)
CALCIUM: 8.4 mg/dL — AB (ref 8.9–10.3)
CO2: 35 mmol/L — ABNORMAL HIGH (ref 22–32)
CREATININE: 1.64 mg/dL — AB (ref 0.44–1.00)
Chloride: 97 mmol/L — ABNORMAL LOW (ref 101–111)
GFR calc Af Amer: 36 mL/min — ABNORMAL LOW (ref >60)
GFR, EST NON AFRICAN AMERICAN: 31 mL/min — AB (ref >60)
Glucose, Bld: 286 mg/dL — ABNORMAL HIGH (ref 65–99)
Potassium: 5.3 mmol/L — ABNORMAL HIGH (ref 3.5–5.1)
SODIUM: 144 mmol/L (ref 135–145)

## 2015-12-22 LAB — CBC
HCT: 37.8 % (ref 36.0–46.0)
Hemoglobin: 10.8 g/dL — ABNORMAL LOW (ref 12.0–15.0)
MCH: 26.6 pg (ref 26.0–34.0)
MCHC: 28.6 g/dL — ABNORMAL LOW (ref 30.0–36.0)
MCV: 93.1 fL (ref 78.0–100.0)
PLATELETS: 189 10*3/uL (ref 150–400)
RBC: 4.06 MIL/uL (ref 3.87–5.11)
RDW: 17.2 % — AB (ref 11.5–15.5)
WBC: 3 10*3/uL — AB (ref 4.0–10.5)

## 2015-12-22 LAB — CULTURE, RESPIRATORY

## 2015-12-22 LAB — GLUCOSE, CAPILLARY
Glucose-Capillary: 225 mg/dL — ABNORMAL HIGH (ref 65–99)
Glucose-Capillary: 269 mg/dL — ABNORMAL HIGH (ref 65–99)
Glucose-Capillary: 279 mg/dL — ABNORMAL HIGH (ref 65–99)
Glucose-Capillary: 308 mg/dL — ABNORMAL HIGH (ref 65–99)

## 2015-12-22 MED ORDER — CETYLPYRIDINIUM CHLORIDE 0.05 % MT LIQD
7.0000 mL | Freq: Two times a day (BID) | OROMUCOSAL | Status: DC
  Administered 2015-12-22 – 2015-12-25 (×6): 7 mL via OROMUCOSAL

## 2015-12-22 MED ORDER — DIPHENHYDRAMINE HCL 25 MG PO CAPS
ORAL_CAPSULE | ORAL | Status: AC
  Filled 2015-12-22: qty 1

## 2015-12-22 MED ORDER — INSULIN ASPART 100 UNIT/ML ~~LOC~~ SOLN
0.0000 [IU] | Freq: Three times a day (TID) | SUBCUTANEOUS | Status: DC
  Administered 2015-12-22: 11 [IU] via SUBCUTANEOUS
  Administered 2015-12-22: 5 [IU] via SUBCUTANEOUS
  Administered 2015-12-22: 8 [IU] via SUBCUTANEOUS
  Administered 2015-12-23: 3 [IU] via SUBCUTANEOUS
  Administered 2015-12-23: 11 [IU] via SUBCUTANEOUS
  Administered 2015-12-23: 3 [IU] via SUBCUTANEOUS
  Administered 2015-12-24: 8 [IU] via SUBCUTANEOUS
  Administered 2015-12-24: 5 [IU] via SUBCUTANEOUS
  Administered 2015-12-24: 8 [IU] via SUBCUTANEOUS
  Administered 2015-12-25: 3 [IU] via SUBCUTANEOUS
  Administered 2015-12-25: 11 [IU] via SUBCUTANEOUS
  Administered 2015-12-25: 3 [IU] via SUBCUTANEOUS

## 2015-12-22 MED ORDER — INSULIN ASPART 100 UNIT/ML ~~LOC~~ SOLN
0.0000 [IU] | Freq: Every day | SUBCUTANEOUS | Status: DC
  Administered 2015-12-22 – 2015-12-24 (×3): 3 [IU] via SUBCUTANEOUS

## 2015-12-22 MED ORDER — PREDNISONE 20 MG PO TABS
40.0000 mg | ORAL_TABLET | Freq: Every day | ORAL | Status: DC
  Administered 2015-12-23 – 2015-12-24 (×2): 40 mg via ORAL
  Filled 2015-12-22 (×2): qty 2

## 2015-12-22 MED ORDER — CHLORHEXIDINE GLUCONATE 0.12 % MT SOLN
15.0000 mL | Freq: Two times a day (BID) | OROMUCOSAL | Status: DC
  Administered 2015-12-23 – 2015-12-25 (×5): 15 mL via OROMUCOSAL
  Filled 2015-12-22 (×5): qty 15

## 2015-12-22 MED ORDER — INSULIN ASPART 100 UNIT/ML ~~LOC~~ SOLN
3.0000 [IU] | Freq: Three times a day (TID) | SUBCUTANEOUS | Status: DC
  Administered 2015-12-22 – 2015-12-23 (×6): 3 [IU] via SUBCUTANEOUS

## 2015-12-22 MED ORDER — DIPHENHYDRAMINE HCL 25 MG PO CAPS
25.0000 mg | ORAL_CAPSULE | Freq: Once | ORAL | Status: AC
  Administered 2015-12-22: 25 mg via ORAL

## 2015-12-22 MED ORDER — INSULIN NPH (HUMAN) (ISOPHANE) 100 UNIT/ML ~~LOC~~ SUSP
10.0000 [IU] | Freq: Once | SUBCUTANEOUS | Status: AC
  Administered 2015-12-22: 10 [IU] via SUBCUTANEOUS
  Filled 2015-12-22: qty 10

## 2015-12-22 MED ORDER — FUROSEMIDE 10 MG/ML IJ SOLN
40.0000 mg | Freq: Two times a day (BID) | INTRAMUSCULAR | Status: DC
  Administered 2015-12-22 – 2015-12-24 (×6): 40 mg via INTRAVENOUS
  Filled 2015-12-22 (×6): qty 4

## 2015-12-22 MED ORDER — INSULIN DETEMIR 100 UNIT/ML ~~LOC~~ SOLN
15.0000 [IU] | Freq: Every day | SUBCUTANEOUS | Status: DC
  Administered 2015-12-22 – 2015-12-23 (×2): 15 [IU] via SUBCUTANEOUS
  Filled 2015-12-22 (×4): qty 0.15

## 2015-12-22 MED ORDER — ENOXAPARIN SODIUM 80 MG/0.8ML ~~LOC~~ SOLN
0.5000 mg/kg | SUBCUTANEOUS | Status: DC
  Administered 2015-12-22 – 2015-12-24 (×3): 75 mg via SUBCUTANEOUS
  Filled 2015-12-22 (×4): qty 0.8

## 2015-12-22 NOTE — Progress Notes (Signed)
OT Cancellation Note  Patient Details Name: Heather Zimmerman. Szewczyk MRN: [id number] DOB: 1945/11/23   Cancelled Treatment:    Reason Eval/Treat Not Completed: Other (comment).  Noted pt is a LTC resident at [facility].  Will defer OT evaluation to that venue.  Deshawn Witty 12/22/2015, 2:41 PM  Lesle Chris, OTR/L [phone] 12/22/2015

## 2015-12-22 NOTE — Progress Notes (Addendum)
[facility] PROGRESS NOTE  Amir Witz. [name] [id number] DOB: Oct 29, 1945 DOA: 12/19/2015 PCP: Gildardo Cranker, DO  Brief Summary  The patient is a 70 year old female with history of morbid obesity, chronic respiratory failure, essential hypertension, chronic diastolic heart failure, diabetes mellitus type 2, peripheral vascular disease, venous stasis ulcers who presented from [facility] with a 3-4 day history of a head cold. She then developed a cough productive of green sputum and became increasingly Avontae Burkhead of breath. In the emergency department, ABG showed acute on chronic respiratory acidosis and she was started on BiPAP.  She was admitted to the intensive care unit for careful monitoring. She was flu positive. Her chest x-ray demonstrated bilateral interstitial opacities and pulmonary edema. She was given some as needed Lasix dose twice a day Lasix. For wheezing, she was started on steroids although the pulmonologist did not feel that she has underlying asthma or COPD.  Assessment/Plan  Wheezing, likely secondary to reactive airways from influenza pneumonia and from acute on chronic diastolic heart failure.   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  -  Continue BIPAP qHS -  Will need outpatient sleep study  Chronic kidney disease stage 3, creatinine rising slightly -  Monitor creatinine closely while  diuresing  Morbid obesity, will try to taper steroids more rapidly if possible, likely deconditioned from pneumonia -  Recommend increased activity and possibly some weight loss if possible -  PT/OT assessments -  SW consult as anticipate discharge back to SNF when ready  Diabetes mellitus type 2, hyperglycemic due to steroids -  Start levemir 15 units (one time dose of NPH this morning to cover until this evening) -  Start aspart 3 units with meals -  Increase to moderate dose SSI  Probable sick euthyroid and from steroids -  Repeat TFTs in 2-4 weeks  Normocytic anemia -  Occult stool -  Repeat hgb in AM  Diet:  diabetic Access:  PIV IVF:  off Proph:  lovenox  Code Status: Full code Family Communication: Patient alone Disposition Plan: Transfer to medical surgical bed with ongoing continuous pulse oximetry   Consultants:  PCCM  CULTURES: Sputum 3/7>>> normal flora Flu>>>Flu A + BC 3/7>>> NGTD Urine 3/7>>> neg ANTIBIOTICS: ceftaz 3/7>>>12/20/15 vanc 3/7>>>12/20/15 Tamiflu 12/20/15 >>>  SIGNIFICANT EVENTS: 3/7- resp failure, NIMV, fever 12/20/15: Afebrile since MN, CO2 down trending as of 12/20/15 12/21/15: Afebrile 12/22/15:  Transfer out to floor  HPI/Subjective:  States she continues to have cough and wheeze.  The bipap machine is noisy and uncomfortable but she was able to wear it overnight, all night.  Still SOB at rest.    Objective: Filed Vitals:   12/22/15 0725 12/22/15 0800 12/22/15 1000 12/22/15 1200  BP:  144/63 128/43 111/58  Pulse:  63 71 69  Temp: 97.6 F (36.4 C)     TempSrc: Oral     Resp:  21 17 19  Height:      Weight:      SpO2: 97% 94% 95% 97%    Intake/Output Summary (Last 24 hours) at 12/22/15 1246 Last data filed at 12/22/15 1241  Gross per 24 hour  Intake   1320 ml  Output   1400 ml  Net    -80 ml   Filed Weights   12/20/15 2000 12/21/15 0600 12/22/15 0500  Weight: 145.1 kg (319 lb 14.2 oz) 146.9 kg (323 lb 13.7 oz) 145.2 kg (320  lb 1.7 oz)   Body mass index is 69.25 kg/(m^2).  Exam:   General:  Adult female, mild respiratory distress with wheezing and tachypnea and cough while resting.   HEENT:  NCAT, MMM  Cardiovascular:  RRR, nl S1, S2 no mrg, 2+ pulses, warm extremities  Respiratory:   Moderate pitched full expiratory wheeze, no focal rales or rhonchi  Abdomen:   NABS, soft, NT/ND  MSK:   Normal tone and bulk, 1+ edema of hands, left leg in unna boot, right leg amputation  Neuro:  Grossly moves all extremities  Data Reviewed: Basic Metabolic Panel:  Recent Labs Lab 12/19/15 1326 12/20/15 0306 12/22/15 0307  NA 149* 143 144  K 4.6 4.8 5.3*  CL 101 100* 97*  CO2 38* 35* 35*  GLUCOSE 120* 169* 286*  BUN 21* 22* 47*  CREATININE 1.34* 1.33* 1.64*  CALCIUM 9.2 8.7* 8.4*  MG  --  2.4  --   PHOS  --  3.6  --    Liver Function Tests: No results for input(s): AST, ALT, ALKPHOS, BILITOT, PROT, ALBUMIN in the last 168 hours. No results for input(s): LIPASE, AMYLASE in the last 168 hours. No results for input(s): AMMONIA in the last 168 hours. CBC:  Recent Labs Lab 12/19/15 1326 12/20/15 0306 12/22/15 0306  WBC 6.6 8.2 3.0*  NEUTROABS 5.1  --   --   HGB 11.4* 11.2* 10.8*  HCT 40.1 38.0 37.8  MCV 92.6 92.2 93.1  PLT 190 183 189    Recent Results (from the past 240 hour(s))  Urine culture     Status: None   Collection Time: 12/19/15 12:41 PM  Result Value Ref Range Status   Specimen Description URINE, CATHETERIZED  Final   Special Requests NONE  Final   Culture   Final    NO GROWTH 2 DAYS Performed at [facility]    Report Status 12/21/2015 FINAL  Final  Culture, blood (Routine X 2) w Reflex to ID Panel     Status: None (Preliminary result)   Collection Time: 12/19/15 12:58 PM  Result Value Ref Range Status   Specimen Description BLOOD RIGHT FOREARM  Final   Special Requests BOTTLES DRAWN AEROBIC AND ANAEROBIC 5CC EACH  Final   Culture   Final    NO GROWTH 3  DAYS Performed at [facility]    Report Status PENDING  Incomplete  Culture, blood (Routine X 2) w Reflex to ID Panel     Status: None (Preliminary result)   Collection Time: 12/19/15  1:09 PM  Result Value Ref Range Status   Specimen Description BLOOD LEFT FOREARM  Final   Special Requests BOTTLES DRAWN AEROBIC AND ANAEROBIC 5CC EACH  Final   Culture   Final    NO GROWTH 3 DAYS Performed at [facility]    Report Status PENDING  Incomplete  MRSA PCR Screening     Status: Abnormal   Collection Time: 12/19/15  4:48 PM  Result Value Ref Range Status   MRSA by PCR POSITIVE (A) NEGATIVE Final    Comment:        The GeneXpert MRSA Assay (FDA approved for NASAL specimens only), is one component of a comprehensive MRSA colonization surveillance program. It is not intended to diagnose MRSA infection nor to guide or monitor treatment for MRSA infections. RESULT CALLED TO, READ BACK BY AND VERIFIED WITH: HANS JOHNSON,RN [id number] @ [id number] BY J SCOTTON   Culture, expectorated sputum-assessment     Status: None   Collection Time: 12/20/15 10:53 AM  Result Value Ref Range Status   Specimen Description SPUTUM  Final   Special Requests Normal  Final   Sputum evaluation   Final    THIS SPECIMEN IS ACCEPTABLE. RESPIRATORY CULTURE REPORT TO FOLLOW.   Report Status 12/20/2015 FINAL  Final  Culture, respiratory (NON-Expectorated)     Status: None   Collection Time: 12/20/15 10:53 AM  Result Value Ref Range Status   Specimen Description SPUTUM  Final   Special Requests NONE  Final   Gram Stain   Final    ABUNDANT WBC PRESENT,BOTH PMN AND MONONUCLEAR FEW SQUAMOUS EPITHELIAL CELLS PRESENT MODERATE GRAM POSITIVE COCCI IN PAIRS MODERATE GRAM NEGATIVE RODS FEW GRAM POSITIVE RODS THIS SPECIMEN IS ACCEPTABLE FOR SPUTUM CULTURE Performed at [facility]    Culture   Final    NORMAL OROPHARYNGEAL FLORA Performed at [facility]    Report Status 12/22/2015 FINAL   Final     Studies: Dg Chest Port 1 View  12/22/2015  CLINICAL DATA:  Respiratory failure, history of diabetes and hypertension EXAM: PORTABLE CHEST 1 VIEW COMPARISON:  12/19/2015 FINDINGS: Limited inspiratory effect. Mild to moderate cardiac enlargement. Significant vascular congestion with moderate interstitial prominence. No consolidation or significant effusion. IMPRESSION: No significant change from 12/19/2015 with findings again consistent with moderately severe pulmonary edema Electronically Signed   By: Skipper Cliche M.D.   On: 12/22/2015 07:13    Scheduled Meds: . antiseptic oral rinse  7 mL Mouth Rinse q12n4p  . [START ON 12/23/2015] chlorhexidine  15 mL Mouth Rinse BID  . Chlorhexidine Gluconate Cloth  6 each Topical Q0600  . fluticasone  2 spray Each Nare Daily  . furosemide  40 mg Intravenous BID  . gabapentin  600 mg Oral BID  . guaiFENesin  600 mg Oral BID  . insulin aspart  0-15 Units Subcutaneous TID WC  . insulin aspart  0-5 Units Subcutaneous QHS  . insulin aspart  3 Units Subcutaneous TID WC  . insulin detemir  15 Units Subcutaneous QHS  . ipratropium-albuterol  3 mL Nebulization Q4H  . mupirocin ointment  1 application Nasal BID  . oseltamivir  30 mg Oral BID  . pantoprazole  40 mg Oral Daily  . predniSONE  40 mg Oral Q breakfast   Continuous Infusions:   Active Problems:   Acute on chronic respiratory failure with hypercapnia (HCC)   Type II diabetes mellitus with neurological manifestations (HCC)   Acute respiratory failure (HCC)   Sepsis ([place])   Hypertensive heart disease with congestive heart failure and stage 3 kidney disease (HCC)   Acute on chronic diastolic CHF (congestive heart failure) ([place])    Time spent: 30 min    Mellody Masri, [facility] Pager [phone]. If 7PM-7AM, please contact night-coverage at [email or web address], password [facility] 12/22/2015, 12:46 PM  LOS: 3 days

## 2015-12-22 NOTE — Progress Notes (Signed)
Pt transferred to 1325 via bed. Report given to [facility] and all questions answered. Pt will call family and update.

## 2015-12-22 NOTE — Progress Notes (Signed)
PHARMACY CONSULT: Lovenox for VTE prophylaxis   Wt: 145 kg BMI:  69 Scr:  1.64 CrCl >30 ml/hr  H/H: 10.8/37.8 Pltc: 189K  A/P:  Due to morbid obesity, begin weight-adjusted lovenox 0.5mg /kg (75 mg) sq q24h  Monitor CBC and renal function, adjust as needed. SCr increased today. Will delay first dose of prophylactic Lovenox to this evening since recently received SQ heparin.   Hershal Coria, PharmD, BCPS Pager: [phone] 12/22/2015 12:47 PM

## 2015-12-23 DIAGNOSIS — J111 Influenza due to unidentified influenza virus with other respiratory manifestations: Secondary | ICD-10-CM

## 2015-12-23 LAB — BASIC METABOLIC PANEL
ANION GAP: 9 (ref 5–15)
BUN: 40 mg/dL — AB (ref 6–20)
CALCIUM: 9.1 mg/dL (ref 8.9–10.3)
CO2: 34 mmol/L — ABNORMAL HIGH (ref 22–32)
CREATININE: 1.45 mg/dL — AB (ref 0.44–1.00)
Chloride: 98 mmol/L — ABNORMAL LOW (ref 101–111)
GFR calc Af Amer: 41 mL/min — ABNORMAL LOW (ref >60)
GFR, EST NON AFRICAN AMERICAN: 36 mL/min — AB (ref >60)
GLUCOSE: 247 mg/dL — AB (ref 65–99)
Potassium: 4.5 mmol/L (ref 3.5–5.1)
Sodium: 141 mmol/L (ref 135–145)

## 2015-12-23 LAB — CBC
HCT: 40.4 % (ref 36.0–46.0)
Hemoglobin: 12.2 g/dL (ref 12.0–15.0)
MCH: 26.9 pg (ref 26.0–34.0)
MCHC: 30.2 g/dL (ref 30.0–36.0)
MCV: 89 fL (ref 78.0–100.0)
PLATELETS: 163 10*3/uL (ref 150–400)
RBC: 4.54 MIL/uL (ref 3.87–5.11)
RDW: 16.8 % — AB (ref 11.5–15.5)
WBC: 3.6 10*3/uL — ABNORMAL LOW (ref 4.0–10.5)

## 2015-12-23 LAB — GLUCOSE, CAPILLARY
GLUCOSE-CAPILLARY: 175 mg/dL — AB (ref 65–99)
GLUCOSE-CAPILLARY: 219 mg/dL — AB (ref 65–99)
GLUCOSE-CAPILLARY: 286 mg/dL — AB (ref 65–99)
Glucose-Capillary: 294 mg/dL — ABNORMAL HIGH (ref 65–99)
Glucose-Capillary: 285 mg/dL — ABNORMAL HIGH (ref 65–99)
Glucose-Capillary: 323 mg/dL — ABNORMAL HIGH (ref 65–99)

## 2015-12-23 LAB — OCCULT BLOOD X 1 CARD TO LAB, STOOL: FECAL OCCULT BLD: POSITIVE — AB

## 2015-12-23 MED ORDER — ACETAMINOPHEN 325 MG PO TABS
650.0000 mg | ORAL_TABLET | Freq: Four times a day (QID) | ORAL | Status: DC | PRN
  Administered 2015-12-23 – 2015-12-24 (×3): 650 mg via ORAL
  Filled 2015-12-23 (×3): qty 2

## 2015-12-23 MED ORDER — IPRATROPIUM-ALBUTEROL 0.5-2.5 (3) MG/3ML IN SOLN
3.0000 mL | RESPIRATORY_TRACT | Status: DC
  Administered 2015-12-23 – 2015-12-25 (×8): 3 mL via RESPIRATORY_TRACT
  Filled 2015-12-23 (×10): qty 3

## 2015-12-23 NOTE — Progress Notes (Signed)
PT Note  Patient Details Name: Heather Zimmerman MRN: [id number] DOB: [birth year]   Reviewed chart and spoke with the patient at length today. Pt with a headache and deferred OOB today anyway. Pt with RAKA, and L LE in unna boot. Pt states they used a lift at [employer] for [place] activities. She had been working with PT at one time for sliding board over to her speciality WC. In the speciality Bari bed she is currently it, it would make sliding from one service to another very challenging and would recommend a lift for OOB to chair at this time. Pt states she will likely return to PT once back to the facility. Will defer until SNF. Please call if you need our assessment for patient's return.    Clide Dales 12/23/2015, 1:30 PM  Clide Dales, PT Pager: [phone] 12/23/2015

## 2015-12-23 NOTE — Progress Notes (Signed)
[facility] PROGRESS NOTE  Heather Zimmerman. Heather Zimmerman [id number] DOB: 10-20-1945 DOA: 12/19/2015 PCP: Gildardo Cranker, DO  Brief Summary  The patient is a 70 year old female with history of morbid obesity, chronic respiratory failure, essential hypertension, chronic diastolic heart failure, diabetes mellitus type 2, peripheral vascular disease, venous stasis ulcers who presented from [facility] skilled nursing facility with a 3-4 day history of a head cold. She then developed a cough productive of green sputum and became increasingly Jyll Tomaro of breath. In the emergency department, ABG showed acute on chronic respiratory acidosis and she was started on BiPAP.  She was admitted to the intensive care unit for careful monitoring. She was flu positive. Her chest x-ray demonstrated bilateral interstitial opacities and pulmonary edema. She was given some as needed Lasix dose twice a day Lasix. For wheezing, she was started on steroids although the pulmonologist did not feel that she has underlying asthma or COPD.  Assessment/Plan  Wheezing, likely secondary to reactive airways from influenza pneumonia and from acute on chronic diastolic heart failure.   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  -  Continue BIPAP qHS -  Will need outpatient sleep study  Chronic kidney disease stage 3, creatinine stable -  Monitor creatinine closely while  diuresing  Morbid obesity, will try to taper steroids more rapidly if possible, likely deconditioned from pneumonia -  Recommend increased activity and possibly some weight loss if possible -  PT/OT assessments -  SW consult as anticipate discharge back to SNF when ready  Diabetes mellitus type 2, hyperglycemic due to steroids -  Tapering steroids -  Continue levemir 15 units  -  Continue aspart 3 units with meals -  continue moderate dose SSI  Probable sick euthyroid and from steroids -  Repeat TFTs in 2-4 weeks  Normocytic anemia -  Occult stool positive -  Repeat hgb in AM -  Needs GI follow  Diet:  diabetic Access:  PIV IVF:  off Proph:  lovenox  Code Status: Full code Family Communication: Patient alone Disposition Plan:  Pending improvement in dyspnea   Consultants:  PCCM  CULTURES: Sputum 3/7>>> normal flora Flu>>>Flu A + BC 3/7>>> NGTD Urine 3/7>>> neg ANTIBIOTICS: ceftaz 3/7>>>12/20/15 vanc 3/7>>>12/20/15 Tamiflu 12/20/15 >>>  SIGNIFICANT EVENTS: 3/7- resp failure, NIMV, fever 12/20/15: Afebrile since MN, CO2 down trending as of 12/20/15 12/21/15: Afebrile 12/22/15:  Transferred out to floor  HPI/Subjective:  Persistent cough and wheeze.  Tolerated bipap machine last night.  Still SOB at rest and with minimal exertion, very different from her baseline.    Objective: Filed Vitals:   12/23/15 0615 12/23/15 0727 12/23/15 1223 12/23/15 1407  BP: 137/80   125/66  Pulse: 74   65  Temp: 98.1 F (36.7 C)   97.6 F (36.4 C)  TempSrc: Oral   Oral  Resp: 18   18  Height:      Weight:  145.332 kg (320 lb 6.4 oz)    SpO2: 97%  98% 93%    Intake/Output Summary (Last 24 hours) at 12/23/15 1507 Last data filed at 12/23/15 1300  Gross per 24 hour  Intake    955 ml  Output   2200 ml  Net  -1245 ml   Filed Weights   12/21/15 0600 12/22/15 0500 12/23/15 0727  Weight: 146.9 kg (323 lb 13.7 oz) 145.2 kg (320 lb 1.7 oz) 145.332 kg (320 lb 6.4 oz)   Body mass  index is 69.31 kg/(m^2).  Exam:   General:  Adult female, mild respiratory distress with wheezing and tachypnea, nasal flaring, SCM retractions  HEENT:  NCAT, MMM  Cardiovascular:  RRR, nl S1, S2 no mrg, 2+ pulses, warm extremities  Respiratory:   Moderate pitched full expiratory wheeze, no focal rales or rhonchi  Abdomen:   NABS, soft, NT/ND  MSK:   Normal tone and bulk, hand edema resolve, left leg in unna boot, right leg amputation  Neuro:  Grossly moves all extremities  Data Reviewed: Basic Metabolic Panel:  Recent Labs Lab 12/19/15 1326 12/20/15 0306 12/22/15 0307 12/23/15 0919  NA 149* 143 144 141  K 4.6 4.8 5.3* 4.5  CL 101 100* 97* 98*  CO2 38* 35* 35* 34*  GLUCOSE 120* 169* 286* 247*  BUN 21* 22* 47* 40*  CREATININE 1.34* 1.33* 1.64* 1.45*  CALCIUM 9.2 8.7* 8.4* 9.1  MG  --  2.4  --   --   PHOS  --  3.6  --   --    Liver Function Tests: No results for input(s): AST, ALT, ALKPHOS, BILITOT, PROT, ALBUMIN in the last 168 hours. No results for input(s): LIPASE, AMYLASE in the last 168 hours. No results for input(s): AMMONIA in the last 168 hours. CBC:  Recent Labs Lab 12/19/15 1326 12/20/15 0306 12/22/15 0306 12/23/15 0919  WBC 6.6 8.2 3.0* 3.6*  NEUTROABS 5.1  --   --   --   HGB 11.4* 11.2* 10.8* 12.2  HCT 40.1 38.0 37.8 40.4  MCV 92.6 92.2 93.1 89.0  PLT 190 183 189 163    Recent Results (from the past 240 hour(s))  Urine culture     Status: None   Collection Time: 12/19/15 12:41 PM  Result Value Ref Range Status   Specimen Description URINE, CATHETERIZED  Final   Special Requests NONE  Final   Culture   Final    NO GROWTH 2 DAYS Performed at [facility]    Report Status 12/21/2015 FINAL  Final  Culture, blood (Routine X 2) w Reflex to ID Panel     Status: None (Preliminary result)   Collection Time: 12/19/15 12:58 PM  Result Value Ref Range Status   Specimen Description BLOOD RIGHT FOREARM  Final   Special Requests BOTTLES  DRAWN AEROBIC AND ANAEROBIC 5CC EACH  Final   Culture   Final    NO GROWTH 3 DAYS Performed at [facility]    Report Status PENDING  Incomplete  Culture, blood (Routine X 2) w Reflex to ID Panel     Status: None (Preliminary result)   Collection Time: 12/19/15  1:09 PM  Result Value Ref Range Status   Specimen Description BLOOD LEFT FOREARM  Final   Special Requests BOTTLES DRAWN AEROBIC AND ANAEROBIC 5CC EACH  Final   Culture   Final    NO GROWTH 3 DAYS Performed at [facility]    Report Status PENDING  Incomplete  MRSA PCR Screening     Status: Abnormal   Collection Time: 12/19/15  4:48 PM  Result Value Ref Range Status   MRSA by PCR POSITIVE (A) NEGATIVE Final    Comment:        The GeneXpert MRSA Assay (FDA approved for NASAL specimens only), is one component of a comprehensive MRSA colonization surveillance program. It is not intended to diagnose MRSA infection nor to guide or monitor treatment for MRSA infections. RESULT CALLED TO, READ BACK BY AND VERIFIED WITH: HANS JOHNSON,RN [id number] @ [id number] BY J SCOTTON   Culture, expectorated sputum-assessment     Status: None   Collection Time: 12/20/15 10:53 AM  Result Value Ref Range Status   Specimen Description SPUTUM  Final   Special Requests Normal  Final   Sputum evaluation   Final    THIS SPECIMEN IS ACCEPTABLE. RESPIRATORY CULTURE REPORT TO FOLLOW.   Report Status 12/20/2015 FINAL  Final  Culture, respiratory (NON-Expectorated)     Status: None   Collection Time: 12/20/15 10:53 AM  Result Value Ref Range Status   Specimen Description SPUTUM  Final   Special Requests NONE  Final   Gram Stain   Final    ABUNDANT WBC PRESENT,BOTH PMN AND MONONUCLEAR FEW SQUAMOUS EPITHELIAL CELLS PRESENT MODERATE GRAM POSITIVE COCCI IN PAIRS MODERATE GRAM NEGATIVE RODS FEW GRAM POSITIVE RODS THIS SPECIMEN IS ACCEPTABLE FOR SPUTUM CULTURE Performed at [facility]    Culture   Final    NORMAL OROPHARYNGEAL  FLORA Performed at [facility]    Report Status 12/22/2015 FINAL  Final     Studies: Dg Chest Port 1 View  12/22/2015  CLINICAL DATA:  Respiratory failure, history of diabetes and hypertension EXAM: PORTABLE CHEST 1 VIEW COMPARISON:  12/19/2015 FINDINGS: Limited inspiratory effect. Mild to moderate cardiac enlargement. Significant vascular congestion with moderate interstitial prominence. No consolidation or significant effusion. IMPRESSION: No significant change from 12/19/2015 with findings again consistent with moderately severe pulmonary edema Electronically Signed   By: Skipper Cliche M.D.   On: 12/22/2015 07:13    Scheduled Meds: . antiseptic oral rinse  7 mL Mouth Rinse q12n4p  . chlorhexidine  15 mL Mouth Rinse BID  . Chlorhexidine Gluconate Cloth  6 each Topical Q0600  . enoxaparin (LOVENOX) injection  0.5 mg/kg Subcutaneous Q24H  . fluticasone  2 spray Each Nare Daily  . furosemide  40 mg Intravenous BID  . gabapentin  600 mg Oral BID  . guaiFENesin  600 mg Oral BID  . insulin aspart  0-15 Units Subcutaneous TID WC  . insulin aspart  0-5 Units Subcutaneous QHS  . insulin aspart  3 Units Subcutaneous TID WC  . insulin detemir  15 Units Subcutaneous QHS  . ipratropium-albuterol  3 mL Nebulization Q4H WA  . mupirocin ointment  1 application Nasal BID  . oseltamivir  30 mg Oral BID  . pantoprazole  40 mg Oral Daily  . predniSONE  40 mg Oral Q breakfast   Continuous Infusions:   Active Problems:   Acute on chronic respiratory failure with hypercapnia (HCC)   Type II diabetes mellitus with neurological manifestations (HCC)   Acute respiratory failure (HCC)   Sepsis ([place])   Hypertensive heart disease with congestive heart failure and stage 3 kidney disease (HCC)   Acute on chronic diastolic CHF (congestive heart failure) ([place])    Time spent: 30 min    Akaila Rambo, [place] [facility] Pager [phone]. If 7PM-7AM, please contact  night-coverage at  [email or web address], password [facility] 12/23/2015, 3:07 PM  LOS: 4 days

## 2015-12-23 NOTE — Progress Notes (Signed)
Utilization review completed.  

## 2015-12-24 LAB — GLUCOSE, CAPILLARY
GLUCOSE-CAPILLARY: 279 mg/dL — AB (ref 65–99)
GLUCOSE-CAPILLARY: 305 mg/dL — AB (ref 65–99)
GLUCOSE-CAPILLARY: 280 mg/dL — AB (ref 65–99)
GLUCOSE-CAPILLARY: 281 mg/dL — AB (ref 65–99)
Glucose-Capillary: 201 mg/dL — ABNORMAL HIGH (ref 65–99)
Glucose-Capillary: 261 mg/dL — ABNORMAL HIGH (ref 65–99)

## 2015-12-24 LAB — CULTURE, BLOOD (ROUTINE X 2)
Culture: NO GROWTH
Culture: NO GROWTH

## 2015-12-24 LAB — CBC
HCT: 38 % (ref 36.0–46.0)
Hemoglobin: 11.3 g/dL — ABNORMAL LOW (ref 12.0–15.0)
MCH: 27.3 pg (ref 26.0–34.0)
MCHC: 29.7 g/dL — AB (ref 30.0–36.0)
MCV: 91.8 fL (ref 78.0–100.0)
PLATELETS: 188 10*3/uL (ref 150–400)
RBC: 4.14 MIL/uL (ref 3.87–5.11)
RDW: 16.6 % — AB (ref 11.5–15.5)
WBC: 4.2 10*3/uL (ref 4.0–10.5)

## 2015-12-24 LAB — BASIC METABOLIC PANEL
ANION GAP: 8 (ref 5–15)
BUN: 40 mg/dL — AB (ref 6–20)
CHLORIDE: 97 mmol/L — AB (ref 101–111)
CO2: 38 mmol/L — ABNORMAL HIGH (ref 22–32)
CREATININE: 1.33 mg/dL — AB (ref 0.44–1.00)
Calcium: 9.1 mg/dL (ref 8.9–10.3)
GFR calc non Af Amer: 39 mL/min — ABNORMAL LOW (ref >60)
GFR, EST AFRICAN AMERICAN: 46 mL/min — AB (ref >60)
GLUCOSE: 254 mg/dL — AB (ref 65–99)
Potassium: 4.5 mmol/L (ref 3.5–5.1)
SODIUM: 143 mmol/L (ref 135–145)

## 2015-12-24 MED ORDER — INSULIN ASPART 100 UNIT/ML ~~LOC~~ SOLN
5.0000 [IU] | Freq: Three times a day (TID) | SUBCUTANEOUS | Status: DC
  Administered 2015-12-24 (×3): 5 [IU] via SUBCUTANEOUS

## 2015-12-24 MED ORDER — PREDNISONE 5 MG PO TABS
30.0000 mg | ORAL_TABLET | Freq: Every day | ORAL | Status: DC
  Administered 2015-12-25: 30 mg via ORAL
  Filled 2015-12-24 (×2): qty 1

## 2015-12-24 MED ORDER — INSULIN DETEMIR 100 UNIT/ML ~~LOC~~ SOLN
20.0000 [IU] | Freq: Every day | SUBCUTANEOUS | Status: DC
  Administered 2015-12-24: 20 [IU] via SUBCUTANEOUS
  Filled 2015-12-24: qty 0.2

## 2015-12-24 NOTE — Progress Notes (Signed)
Per RN/MD notes, bipap held tonight.  RN will monitor o2 sats on 2lnc overnight and will call RT if bipap needed.

## 2015-12-24 NOTE — Progress Notes (Signed)
[facility] PROGRESS NOTE  Heather Zimmerman. Heather Zimmerman DOB: January 16, 1946 DOA: 12/19/2015 PCP: Gildardo Cranker, DO  Brief Summary  The patient is a 70 year old female with history of morbid obesity, chronic respiratory failure, essential hypertension, chronic diastolic heart failure, diabetes mellitus type 2, peripheral vascular disease, venous stasis ulcers who presented from [facility] facility with a 3-4 day history of a head cold. She then developed a cough productive of green sputum and became increasingly [name] of breath. In the emergency department, ABG showed acute on chronic respiratory acidosis and she was started on BiPAP.  She was admitted to the intensive care unit for careful monitoring. She was flu positive. Her chest x-ray demonstrated bilateral interstitial opacities and pulmonary edema. She was given some as needed Lasix dose twice a day Lasix. For wheezing, she was started on steroids although the pulmonologist did not feel that she has underlying asthma or COPD.  Assessment/Plan  Wheezing, likely secondary to reactive airways from influenza pneumonia and from acute on chronic diastolic heart failure.  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  -  Continue BIPAP qHS -  Will need outpatient sleep study -  Will do trial on 2L [place] at night and document hypoxemia > if so, will qualify for BIPAP  at discharge  Chronic kidney disease stage 3, creatinine trending down slightly with diuresis -  Monitor creatinine closely while diuresing  Morbid obesity, will try to taper steroids more rapidly if possible, likely deconditioned from pneumonia -  Recommend increased activity and possibly some weight loss if possible -  PT/OT > SNF -  SW consult > patient thinks she may want to change SNF   Diabetes mellitus type 2, hyperglycemic due to steroids -  Tapering steroids -  Continue levemir 15 units  -  Increase to aspart 5 units with meals -  continue moderate dose SSI  Probable sick euthyroid and from steroids -  Repeat TFTs in 2-4 weeks  Normocytic anemia, mild, hemoglobin stable -  Occult stool positive -  Needs GI follow  Diet:  diabetic Access:  PIV IVF:  off Proph:  lovenox  Code Status: Full code Family Communication: Patient alone Disposition Plan:  Pending improvement in dyspnea > still volume overloaded with wheezing.  Probable discharge to SNF soon, however.   Consultants:  PCCM  CULTURES: Sputum 3/7>>> normal flora Flu>>>Flu A + BC 3/7>>> NGTD Urine 3/7>>> neg ANTIBIOTICS: ceftaz 3/7>>>12/20/15 vanc 3/7>>>12/20/15 Tamiflu 12/20/15 >>>  SIGNIFICANT EVENTS: 3/7- resp failure, NIMV, fever 12/20/15: Afebrile since MN, CO2 down trending as of 12/20/15 12/21/15: Afebrile 12/22/15:  Transferred out to floor  HPI/Subjective:  Persistent cough and wheeze, but finally noticing a small improvement.  Tolerated bipap machine last night.    Objective: Filed Vitals:   12/23/15 2321 12/24/15 0332 12/24/15 0552 12/24/15 1107  BP:   108/66   Pulse: 73  63 64  Temp:   97.4  F (36.3 C)   TempSrc:   Axillary   Resp: 16  16 18   Height:      Weight:      SpO2: 97% 100% 98% 91%    Intake/Output Summary (Last 24 hours) at 12/24/15 1258 Last data filed at 12/24/15 0602  Gross per 24 hour  Intake    835 ml  Output   2250 ml  Net  -1415 ml   Filed Weights   12/21/15  0600 12/22/15 0500 12/23/15 0727  Weight: 146.9 kg (323 lb 13.7 oz) 145.2 kg (320 lb 1.7 oz) 145.332 kg (320 lb 6.4 oz)   Body mass index is 69.31 kg/(m^2).  Exam:   General:  Adult female, still has nasal flaring, SCM retractions, however, less severe than before  HEENT:  NCAT, MMM  Cardiovascular:  RRR, nl S1, S2 no mrg, 2+ pulses, warm extremities  Respiratory:   Decreased wheeze, no focal rales or rhonchi  Abdomen:   NABS, NT/ND, abdomen is overall softer with diuresis   MSK:   Normal tone and bulk, left leg in unna boot, right leg amputation  Neuro:  Grossly moves all extremities  Data Reviewed: Basic Metabolic Panel:  Recent Labs Lab 12/19/15 1326 12/20/15 0306 12/22/15 0307 12/23/15 0919 12/24/15 0448  NA 149* 143 144 141 143  K 4.6 4.8 5.3* 4.5 4.5  CL 101 100* 97* 98* 97*  CO2 38* 35* 35* 34* 38*  GLUCOSE 120* 169* 286* 247* 254*  BUN 21* 22* 47* 40* 40*  CREATININE 1.34* 1.33* 1.64* 1.45* 1.33*  CALCIUM 9.2 8.7* 8.4* 9.1 9.1  MG  --  2.4  --   --   --   PHOS  --  3.6  --   --   --    Liver Function Tests: No results for input(s): AST, ALT, ALKPHOS, BILITOT, PROT, ALBUMIN in the last 168 hours. No results for input(s): LIPASE, AMYLASE in the last 168 hours. No results for input(s): AMMONIA in the last 168 hours. CBC:  Recent Labs Lab 12/19/15 1326 12/20/15 0306 12/22/15 0306 12/23/15 0919 12/24/15 0448  WBC 6.6 8.2 3.0* 3.6* 4.2  NEUTROABS 5.1  --   --   --   --   HGB 11.4* 11.2* 10.8* 12.2 11.3*  HCT 40.1 38.0 37.8 40.4 38.0  MCV 92.6 92.2 93.1 89.0 91.8  PLT 190 183 189 163 188    Recent Results (from the past 240 hour(s))  Urine culture     Status: None   Collection Time: 12/19/15 12:41 PM  Result Value Ref Range Status   Specimen Description URINE, CATHETERIZED  Final   Special Requests NONE  Final   Culture   Final    NO GROWTH 2 DAYS Performed at [facility]    Report Status 12/21/2015 FINAL  Final  Culture, blood  (Routine X 2) w Reflex to ID Panel     Status: None   Collection Time: 12/19/15 12:58 PM  Result Value Ref Range Status   Specimen Description BLOOD RIGHT FOREARM  Final   Special Requests BOTTLES DRAWN AEROBIC AND ANAEROBIC 5CC EACH  Final   Culture   Final    NO GROWTH 5 DAYS Performed at [facility]    Report Status 12/24/2015 FINAL  Final  Culture, blood (Routine X 2) w Reflex to ID Panel     Status: None   Collection Time: 12/19/15  1:09 PM  Result Value Ref Range Status   Specimen Description  BLOOD LEFT FOREARM  Final   Special Requests BOTTLES DRAWN AEROBIC AND ANAEROBIC 5CC EACH  Final   Culture   Final    NO GROWTH 5 DAYS Performed at [facility]    Report Status 12/24/2015 FINAL  Final  MRSA PCR Screening     Status: Abnormal   Collection Time: 12/19/15  4:48 PM  Result Value Ref Range Status   MRSA by PCR POSITIVE (A) NEGATIVE Final    Comment:        The GeneXpert MRSA Assay (FDA approved for NASAL specimens only), is one component of a comprehensive MRSA colonization surveillance program. It is not intended to diagnose MRSA infection nor to guide or monitor treatment for MRSA infections. RESULT CALLED TO, READ BACK BY AND VERIFIED WITH: HANS JOHNSON,RN [id number] @ [id number] BY J SCOTTON   Culture, expectorated sputum-assessment     Status: None   Collection Time: 12/20/15 10:53 AM  Result Value Ref Range Status   Specimen Description SPUTUM  Final   Special Requests Normal  Final   Sputum evaluation   Final    THIS SPECIMEN IS ACCEPTABLE. RESPIRATORY CULTURE REPORT TO FOLLOW.   Report Status 12/20/2015 FINAL  Final  Culture, respiratory (NON-Expectorated)     Status: None   Collection Time: 12/20/15 10:53 AM  Result Value Ref Range Status   Specimen Description SPUTUM  Final   Special Requests NONE  Final   Gram Stain   Final    ABUNDANT WBC PRESENT,BOTH PMN AND MONONUCLEAR FEW SQUAMOUS EPITHELIAL CELLS PRESENT MODERATE GRAM POSITIVE COCCI IN  PAIRS MODERATE GRAM NEGATIVE RODS FEW GRAM POSITIVE RODS THIS SPECIMEN IS ACCEPTABLE FOR SPUTUM CULTURE Performed at [facility]    Culture   Final    NORMAL OROPHARYNGEAL FLORA Performed at [facility]    Report Status 12/22/2015 FINAL  Final     Studies: No results found.  Scheduled Meds: . antiseptic oral rinse  7 mL Mouth Rinse q12n4p  . chlorhexidine  15 mL Mouth Rinse BID  . Chlorhexidine Gluconate Cloth  6 each Topical Q0600  . enoxaparin (LOVENOX) injection  0.5 mg/kg Subcutaneous Q24H  . fluticasone  2 spray Each Nare Daily  . furosemide  40 mg Intravenous BID  . gabapentin  600 mg Oral BID  . guaiFENesin  600 mg Oral BID  . insulin aspart  0-15 Units Subcutaneous TID WC  . insulin aspart  0-5 Units Subcutaneous QHS  . insulin aspart  5 Units Subcutaneous TID WC  . insulin detemir  20 Units Subcutaneous QHS  . ipratropium-albuterol  3 mL Nebulization Q4H WA  . oseltamivir  30 mg Oral BID  . pantoprazole  40 mg Oral Daily  . predniSONE  40 mg Oral Q breakfast   Continuous Infusions:   Active Problems:   Acute on chronic respiratory failure with hypercapnia (HCC)   Type II diabetes mellitus with neurological manifestations (HCC)   Acute respiratory failure (HCC)   Sepsis ([place])   Hypertensive heart disease with congestive heart failure and stage 3 kidney disease (HCC)   Acute on chronic diastolic CHF (congestive heart failure) ([place])    Time spent: 30 min    Addylin Manke, [facility] Pager [phone]. If 7PM-7AM, please contact night-coverage at [email or web address], password [facility] 12/24/2015, 12:58 PM  LOS: 5 days

## 2015-12-25 DIAGNOSIS — E1149 Type 2 diabetes mellitus with other diabetic neurological complication: Secondary | ICD-10-CM | POA: Diagnosis not present

## 2015-12-25 DIAGNOSIS — J189 Pneumonia, unspecified organism: Secondary | ICD-10-CM | POA: Diagnosis not present

## 2015-12-25 DIAGNOSIS — J9600 Acute respiratory failure, unspecified whether with hypoxia or hypercapnia: Secondary | ICD-10-CM | POA: Diagnosis not present

## 2015-12-25 DIAGNOSIS — L03116 Cellulitis of left lower limb: Secondary | ICD-10-CM | POA: Diagnosis not present

## 2015-12-25 DIAGNOSIS — H40013 Open angle with borderline findings, low risk, bilateral: Secondary | ICD-10-CM | POA: Diagnosis not present

## 2015-12-25 DIAGNOSIS — J9612 Chronic respiratory failure with hypercapnia: Secondary | ICD-10-CM | POA: Diagnosis not present

## 2015-12-25 DIAGNOSIS — G8929 Other chronic pain: Secondary | ICD-10-CM | POA: Diagnosis not present

## 2015-12-25 DIAGNOSIS — R195 Other fecal abnormalities: Secondary | ICD-10-CM | POA: Diagnosis not present

## 2015-12-25 DIAGNOSIS — I7389 Other specified peripheral vascular diseases: Secondary | ICD-10-CM | POA: Diagnosis not present

## 2015-12-25 DIAGNOSIS — R498 Other voice and resonance disorders: Secondary | ICD-10-CM | POA: Diagnosis not present

## 2015-12-25 DIAGNOSIS — J9621 Acute and chronic respiratory failure with hypoxia: Secondary | ICD-10-CM | POA: Diagnosis not present

## 2015-12-25 DIAGNOSIS — I83023 Varicose veins of left lower extremity with ulcer of ankle: Secondary | ICD-10-CM | POA: Diagnosis not present

## 2015-12-25 DIAGNOSIS — Z89511 Acquired absence of right leg below knee: Secondary | ICD-10-CM | POA: Diagnosis not present

## 2015-12-25 DIAGNOSIS — J1100 Influenza due to unidentified influenza virus with unspecified type of pneumonia: Secondary | ICD-10-CM | POA: Diagnosis not present

## 2015-12-25 DIAGNOSIS — E1121 Type 2 diabetes mellitus with diabetic nephropathy: Secondary | ICD-10-CM | POA: Diagnosis not present

## 2015-12-25 DIAGNOSIS — G4733 Obstructive sleep apnea (adult) (pediatric): Secondary | ICD-10-CM

## 2015-12-25 DIAGNOSIS — I83022 Varicose veins of left lower extremity with ulcer of calf: Secondary | ICD-10-CM | POA: Diagnosis not present

## 2015-12-25 DIAGNOSIS — E1143 Type 2 diabetes mellitus with diabetic autonomic (poly)neuropathy: Secondary | ICD-10-CM | POA: Diagnosis not present

## 2015-12-25 DIAGNOSIS — E119 Type 2 diabetes mellitus without complications: Secondary | ICD-10-CM | POA: Diagnosis not present

## 2015-12-25 DIAGNOSIS — K219 Gastro-esophageal reflux disease without esophagitis: Secondary | ICD-10-CM | POA: Diagnosis not present

## 2015-12-25 DIAGNOSIS — I509 Heart failure, unspecified: Secondary | ICD-10-CM | POA: Diagnosis not present

## 2015-12-25 DIAGNOSIS — R4781 Slurred speech: Secondary | ICD-10-CM | POA: Diagnosis not present

## 2015-12-25 DIAGNOSIS — I83029 Varicose veins of left lower extremity with ulcer of unspecified site: Secondary | ICD-10-CM | POA: Diagnosis not present

## 2015-12-25 DIAGNOSIS — I10 Essential (primary) hypertension: Secondary | ICD-10-CM | POA: Diagnosis not present

## 2015-12-25 DIAGNOSIS — I5033 Acute on chronic diastolic (congestive) heart failure: Secondary | ICD-10-CM | POA: Diagnosis not present

## 2015-12-25 DIAGNOSIS — I83028 Varicose veins of left lower extremity with ulcer other part of lower leg: Secondary | ICD-10-CM | POA: Diagnosis not present

## 2015-12-25 DIAGNOSIS — Z794 Long term (current) use of insulin: Secondary | ICD-10-CM | POA: Diagnosis not present

## 2015-12-25 DIAGNOSIS — I130 Hypertensive heart and chronic kidney disease with heart failure and stage 1 through stage 4 chronic kidney disease, or unspecified chronic kidney disease: Secondary | ICD-10-CM | POA: Diagnosis not present

## 2015-12-25 DIAGNOSIS — N183 Chronic kidney disease, stage 3 (moderate): Secondary | ICD-10-CM | POA: Diagnosis not present

## 2015-12-25 DIAGNOSIS — J9622 Acute and chronic respiratory failure with hypercapnia: Secondary | ICD-10-CM | POA: Diagnosis not present

## 2015-12-25 DIAGNOSIS — F329 Major depressive disorder, single episode, unspecified: Secondary | ICD-10-CM | POA: Diagnosis not present

## 2015-12-25 DIAGNOSIS — L97229 Non-pressure chronic ulcer of left calf with unspecified severity: Secondary | ICD-10-CM | POA: Diagnosis not present

## 2015-12-25 DIAGNOSIS — I87312 Chronic venous hypertension (idiopathic) with ulcer of left lower extremity: Secondary | ICD-10-CM | POA: Diagnosis not present

## 2015-12-25 DIAGNOSIS — J9620 Acute and chronic respiratory failure, unspecified whether with hypoxia or hypercapnia: Secondary | ICD-10-CM | POA: Diagnosis not present

## 2015-12-25 DIAGNOSIS — I5032 Chronic diastolic (congestive) heart failure: Secondary | ICD-10-CM | POA: Diagnosis not present

## 2015-12-25 DIAGNOSIS — K210 Gastro-esophageal reflux disease with esophagitis: Secondary | ICD-10-CM | POA: Diagnosis not present

## 2015-12-25 DIAGNOSIS — D631 Anemia in chronic kidney disease: Secondary | ICD-10-CM | POA: Diagnosis not present

## 2015-12-25 DIAGNOSIS — E0821 Diabetes mellitus due to underlying condition with diabetic nephropathy: Secondary | ICD-10-CM | POA: Diagnosis not present

## 2015-12-25 DIAGNOSIS — E662 Morbid (severe) obesity with alveolar hypoventilation: Secondary | ICD-10-CM | POA: Diagnosis not present

## 2015-12-25 DIAGNOSIS — J111 Influenza due to unidentified influenza virus with other respiratory manifestations: Secondary | ICD-10-CM | POA: Diagnosis not present

## 2015-12-25 DIAGNOSIS — H2513 Age-related nuclear cataract, bilateral: Secondary | ICD-10-CM | POA: Diagnosis not present

## 2015-12-25 LAB — BASIC METABOLIC PANEL
Anion gap: 7 (ref 5–15)
BUN: 39 mg/dL — AB (ref 6–20)
CALCIUM: 9.1 mg/dL (ref 8.9–10.3)
CHLORIDE: 96 mmol/L — AB (ref 101–111)
CO2: 40 mmol/L — AB (ref 22–32)
CREATININE: 1.59 mg/dL — AB (ref 0.44–1.00)
GFR calc Af Amer: 37 mL/min — ABNORMAL LOW (ref >60)
GFR calc non Af Amer: 32 mL/min — ABNORMAL LOW (ref >60)
GLUCOSE: 315 mg/dL — AB (ref 65–99)
Potassium: 4.2 mmol/L (ref 3.5–5.1)
Sodium: 143 mmol/L (ref 135–145)

## 2015-12-25 LAB — GLUCOSE, CAPILLARY
GLUCOSE-CAPILLARY: 198 mg/dL — AB (ref 65–99)
GLUCOSE-CAPILLARY: 308 mg/dL — AB (ref 65–99)
GLUCOSE-CAPILLARY: 198 mg/dL — AB (ref 65–99)

## 2015-12-25 MED ORDER — INSULIN ASPART 100 UNIT/ML ~~LOC~~ SOLN
7.0000 [IU] | Freq: Three times a day (TID) | SUBCUTANEOUS | Status: DC
  Administered 2015-12-25 (×3): 7 [IU] via SUBCUTANEOUS

## 2015-12-25 MED ORDER — ALBUTEROL SULFATE (2.5 MG/3ML) 0.083% IN NEBU: 2.5000 mg | INHALATION_SOLUTION | RESPIRATORY_TRACT | Status: DC | PRN

## 2015-12-25 MED ORDER — TORSEMIDE 20 MG PO TABS: ORAL_TABLET | ORAL | Status: DC

## 2015-12-25 MED ORDER — DULOXETINE HCL 20 MG PO CPEP
20.0000 mg | ORAL_CAPSULE | Freq: Every day | ORAL | Status: DC
  Administered 2015-12-25: 20 mg via ORAL
  Filled 2015-12-25: qty 1

## 2015-12-25 MED ORDER — PREDNISONE 10 MG PO TABS: ORAL_TABLET | ORAL | Status: DC

## 2015-12-25 MED ORDER — TORSEMIDE 20 MG PO TABS
20.0000 mg | ORAL_TABLET | Freq: Two times a day (BID) | ORAL | Status: DC
  Administered 2015-12-25 (×2): 20 mg via ORAL
  Filled 2015-12-25 (×3): qty 1

## 2015-12-25 MED ORDER — IPRATROPIUM-ALBUTEROL 0.5-2.5 (3) MG/3ML IN SOLN
3.0000 mL | Freq: Three times a day (TID) | RESPIRATORY_TRACT | Status: DC
  Administered 2015-12-25 (×2): 3 mL via RESPIRATORY_TRACT
  Filled 2015-12-25 (×2): qty 3

## 2015-12-25 MED ORDER — INSULIN DETEMIR 100 UNIT/ML ~~LOC~~ SOLN
26.0000 [IU] | Freq: Every day | SUBCUTANEOUS | Status: DC
  Filled 2015-12-25: qty 0.26

## 2015-12-25 MED ORDER — GUAIFENESIN ER 600 MG PO TB12
1200.0000 mg | ORAL_TABLET | Freq: Two times a day (BID) | ORAL | Status: DC
  Administered 2015-12-25: 1200 mg via ORAL
  Filled 2015-12-25: qty 2

## 2015-12-25 NOTE — Discharge Summary (Signed)
Physician Discharge Summary  Heather Zimmerman. [name] [id number] DOB: 1945-10-25 DOA: 12/19/2015  PCP: Gildardo Cranker, DO  Admit date: 12/19/2015 Discharge date: 12/25/2015  Recommendations for Outpatient Follow-up:  1.  Transfer to SNF for ongoing PT/OT 2.  Recommend A1c in 3 months 3.  Daily weights and increase torsemide if she gains more than 5-lbs 4.  Cardiology follow up within 2-3 weeks or sooner if needed for diastolic heart failure 5.  Completed tamiflu in hospital 6.  2L nasal cannula during the day and bipap at night IP 18, EP 5, backup rage 26, FIO2 0.3 7.  Continue steroid taper: prednisone 20mg  on 3/14-3/15, then 10mg  3/16-3/17, then stop 8.  Repeat BMP in 1 week 9.  Repeat TFTs in late March or early April 10.  Referral to outpatient GI once breathing has improved for mild anemia with occult positive stool 11.  Weekly unna boot changes to the left leg  Discharge Diagnoses:  Principal Problem:   Influenza with pneumonia Active Problems:   Acute on chronic respiratory failure with hypercapnia (HCC)   Obstructive sleep apnea   Type II diabetes mellitus with neurological manifestations (HCC)   Acute respiratory failure (HCC)   Sepsis (HCC)   CKD (chronic kidney disease) stage 3, GFR 30-59 ml/min   Hypertensive heart disease with congestive heart failure and stage 3 kidney disease (HCC)   Acute on chronic diastolic CHF (congestive heart failure) ([place])   Discharge Condition: stable, improved  Diet recommendation: diabetic diet  Wt Readings from Last 3 Encounters:  12/25/15 144.244 kg (318 lb)  12/18/15 148.78 kg (328 lb)  12/07/15 148.78 kg (328 lb)    History of present illness:   The patient is a 70 year old female with history of morbid obesity, chronic respiratory failure, essential hypertension, chronic diastolic heart failure, diabetes mellitus type 2, peripheral vascular disease, venous stasis ulcers who presented from [place] [facility] skilled nursing facility with a  3-4 day history of a head cold. She then developed a cough productive of green sputum and became increasingly Kalecia Hartney of breath. In the emergency department, ABG showed acute on chronic respiratory acidosis and she was started on BiPAP. She was admitted to the intensive care unit for careful monitoring. She was flu positive. Her chest x-ray demonstrated bilateral interstitial opacities and pulmonary edema. She was given some as needed Lasix dose twice a day Lasix. For wheezing, she was started on steroids although the pulmonologist did not feel that she has underlying asthma or COPD.  Hospital Course:   Wheezing, likely secondary to reactive airways from influenza A pneumonia and from acute on chronic diastolic heart failure. Wheezing improving with diuresis.  She was admitted to the ICU and required bipap for several days.  She was started on tamiflu, steroids, duonebs, and diuresis and has had a slow recovery.  She completed her tamiflu in the hospital.  Pulmonology recommended a tapering dose of steroids for influenza inflammation, but they do not feel that she has underlying asthma or COPD.  See above for steroid taper recommendations.  She was diuresed as below.  Continue advair (home medications) and duonebs as needed.    Acute on chronic diastolic heart failure, ECHO demonstrated LVH with a dilated left atrium suggesting diastolic heart failure (known previous diagnosis).  EF was preserved.  CXR showed pulmonary edema and vascular congestion and she was diuresed with lasix 40mg  IV BID with excellent diuresis of one to sometimes more than 2L per day.  Her CO2 started to rise and her  uop has started to decrease (although still very adequate) suggesting that we need to slow her diuresis some.  Recommend increasing her home torsemide to 40mg  in the morning and 20mg  in the evening.  Repeat BMP in 1 week or sooner if needed.    Acute on chronic respiratory failure with hypercapnea, likely obesity  hypoventilation syndrome and sleep apnea.  Performed trial on 2L at night, but still had oxygen desaturations to the mid-80s.  Recommend BIPAP nightly and 2L nasal cannula during the day.     Chronic kidney disease stage 3, creatinine stable at baseline of 1.6-1.8  Morbid obesity, taper steroids, deconditioned from pneumonia.  Recommend ongoing PT/OT.    Diabetes mellitus type 2, hyperglycemic due to steroids, tapering steroids.  Continue home insulin at discharge of 26 units long acting insulin with meal coverage.    Probable sick euthyroid and from steroids.  TSH was 0.305 on high dose steroids.  Repeat TFTs in late March or early April  Normocytic anemia, mild, hemoglobin stable.  Occult stool positive.  Recommend outpatient GI referral once breathing has improved.   Consultants:  PCCM  CULTURES: Sputum 3/7>>> normal flora Flu>>>Flu A + BC 3/7>>> NGTD Urine 3/7>>> neg ANTIBIOTICS: ceftaz 3/7>>>12/20/15 vanc 3/7>>>12/20/15 Tamiflu 12/20/15 >>> 3/13  SIGNIFICANT EVENTS: 3/7- resp failure, NIMV, fever 12/20/15: Afebrile since MN, CO2 down trending as of 12/20/15 12/21/15: Afebrile 12/22/15: Transferred out to floor 12/24/15:  Ongoing diuresis  Discharge Exam: Filed Vitals:   12/25/15 0131 12/25/15 0554  BP:  145/69  Pulse: 69 61  Temp:  98.9 F (37.2 C)  Resp: 20 20   Filed Vitals:   12/25/15 0131 12/25/15 0452 12/25/15 0554 12/25/15 0912  BP:   145/69   Pulse: 69  61   Temp:   98.9 F (37.2 C)   TempSrc:   Oral   Resp: 20  20   Height:      Weight:  144.244 kg (318 lb)    SpO2: 96%  100% 92%     General: Adult female, NAD  HEENT: NCAT, MMM  Cardiovascular: RRR, nl S1, S2 no mrg, 2+ pulses, warm extremities  Respiratory: moderate pitched wheeze, no focal rales or rhonchi, no increased WOB  Abdomen: NABS, NT/ND, abdomen is softer with diuresis   MSK: Normal tone and bulk, left leg in unna boot, right leg amputation  Neuro: Grossly moves all  extremities  Discharge Instructions      Discharge Instructions    (HEART FAILURE PATIENTS) Call MD:  Anytime you have any of the following symptoms: 1) 3 pound weight gain in 24 hours or 5 pounds in 1 week 2) shortness of breath, with or without a dry hacking cough 3) swelling in the hands, feet or stomach 4) if you have to sleep on extra pillows at night in order to breathe.    Complete by:  As directed      Call MD for:  difficulty breathing, headache or visual disturbances    Complete by:  As directed      Call MD for:  extreme fatigue    Complete by:  As directed      Call MD for:  hives    Complete by:  As directed      Call MD for:  persistant dizziness or light-headedness    Complete by:  As directed      Call MD for:  persistant nausea and vomiting    Complete by:  As directed  Call MD for:  severe uncontrolled pain    Complete by:  As directed      Call MD for:  temperature >100.4    Complete by:  As directed      Diet - low sodium heart healthy    Complete by:  As directed      Diet Carb Modified    Complete by:  As directed      Increase activity slowly    Complete by:  As directed             Medication List    STOP taking these medications        azithromycin 250 MG tablet  Commonly known as:  ZITHROMAX     camphor-menthol lotion  Commonly known as:  SARNA     cetirizine-pseudoephedrine 5-120 MG tablet  Commonly known as:  ZYRTEC-D     HYDROcodone-acetaminophen 5-325 MG tablet  Commonly known as:  NORCO/VICODIN      TAKE these medications        acetaminophen 650 MG CR tablet  Commonly known as:  TYLENOL  Take 650 mg by mouth every 6 (six) hours as needed for pain.     antiseptic oral rinse Liqd  15 mLs by Mouth Rinse route as needed for dry mouth.     aspirin 81 MG tablet  Take 81 mg by mouth daily.     cholecalciferol 1000 units tablet  Commonly known as:  VITAMIN D  Take 2,000 Units by mouth daily.     DULoxetine 20 MG capsule   Commonly known as:  CYMBALTA  Take 40 mg by mouth daily.     FLONASE NA  Place 2 sprays into the nose at bedtime. For nasal congestion     Fluticasone-Salmeterol 250-50 MCG/DOSE Aepb  Commonly known as:  ADVAIR  Inhale 1 puff into the lungs daily.     gabapentin 600 MG tablet  Commonly known as:  NEURONTIN  Take 600 mg by mouth 2 (two) times daily.     insulin aspart 100 UNIT/ML injection  Commonly known as:  novoLOG  Inject 5 Units into the skin 3 (three) times daily before meals. With an additional 5 units for cg >=150     ipratropium-albuterol 0.5-2.5 (3) MG/3ML Soln  Commonly known as:  DUONEB  Take 3 mLs by nebulization every 6 (six) hours as needed (shortness of breath).     LANTUS SOLOSTAR 100 UNIT/ML Solostar Pen  Generic drug:  Insulin Glargine  Inject 26 Units into the skin daily at 10 pm.     magnesium oxide 400 MG tablet  Commonly known as:  MAG-OX  Take 400 mg by mouth every 8 (eight) hours.     omeprazole 20 MG capsule  Commonly known as:  PRILOSEC  Take 20 mg by mouth daily.     potassium chloride 10 MEQ tablet  Commonly known as:  K-DUR,KLOR-CON  Take 20 mEq by mouth daily.     predniSONE 10 MG tablet  Commonly known as:  DELTASONE  20mg  daily for two days, then 10mg  daily for two days, then stop     saccharomyces boulardii 250 MG capsule  Commonly known as:  FLORASTOR  Take 250 mg by mouth 2 (two) times daily. For 10 days.     torsemide 20 MG tablet  Commonly known as:  DEMADEX  Take 2 tabs (40mg ) in the morning and 1 tab (20mg ) in the afternoon  The results of significant diagnostics from this hospitalization (including imaging, microbiology, ancillary and laboratory) are listed below for reference.    Significant Diagnostic Studies: Dg Chest Port 1 View  12/22/2015  CLINICAL DATA:  Respiratory failure, history of diabetes and hypertension EXAM: PORTABLE CHEST 1 VIEW COMPARISON:  12/19/2015 FINDINGS: Limited inspiratory effect.  Mild to moderate cardiac enlargement. Significant vascular congestion with moderate interstitial prominence. No consolidation or significant effusion. IMPRESSION: No significant change from 12/19/2015 with findings again consistent with moderately severe pulmonary edema Electronically Signed   By: Skipper Cliche M.D.   On: 12/22/2015 07:13   Dg Chest Port 1 View  12/19/2015  CLINICAL DATA:  Shortness of breath and productive cough congestion. History of COPD. EXAM: PORTABLE CHEST 1 VIEW COMPARISON:  Single-view of the chest 10/05/2015 and 06/27/2014. FINDINGS: There is cardiomegaly and interstitial pulmonary edema. No pneumothorax or pleural effusion. IMPRESSION: Cardiomegaly and interstitial edema. Electronically Signed   By: Inge Rise M.D.   On: 12/19/2015 13:27    Microbiology: Recent Results (from the past 240 hour(s))  Urine culture     Status: None   Collection Time: 12/19/15 12:41 PM  Result Value Ref Range Status   Specimen Description URINE, CATHETERIZED  Final   Special Requests NONE  Final   Culture   Final    NO GROWTH 2 DAYS Performed at [facility]    Report Status 12/21/2015 FINAL  Final  Culture, blood (Routine X 2) w Reflex to ID Panel     Status: None   Collection Time: 12/19/15 12:58 PM  Result Value Ref Range Status   Specimen Description BLOOD RIGHT FOREARM  Final   Special Requests BOTTLES DRAWN AEROBIC AND ANAEROBIC 5CC EACH  Final   Culture   Final    NO GROWTH 5 DAYS Performed at [facility]    Report Status 12/24/2015 FINAL  Final  Culture, blood (Routine X 2) w Reflex to ID Panel     Status: None   Collection Time: 12/19/15  1:09 PM  Result Value Ref Range Status   Specimen Description BLOOD LEFT FOREARM  Final   Special Requests BOTTLES DRAWN AEROBIC AND ANAEROBIC 5CC EACH  Final   Culture   Final    NO GROWTH 5 DAYS Performed at [facility]    Report Status 12/24/2015 FINAL  Final  MRSA PCR Screening     Status:  Abnormal   Collection Time: 12/19/15  4:48 PM  Result Value Ref Range Status   MRSA by PCR POSITIVE (A) NEGATIVE Final    Comment:        The GeneXpert MRSA Assay (FDA approved for NASAL specimens only), is one component of a comprehensive MRSA colonization surveillance program. It is not intended to diagnose MRSA infection nor to guide or monitor treatment for MRSA infections. RESULT CALLED TO, READ BACK BY AND VERIFIED WITH: HANS JOHNSON,RN [id number] @ [id number] BY J SCOTTON   Culture, expectorated sputum-assessment     Status: None   Collection Time: 12/20/15 10:53 AM  Result Value Ref Range Status   Specimen Description SPUTUM  Final   Special Requests Normal  Final   Sputum evaluation   Final    THIS SPECIMEN IS ACCEPTABLE. RESPIRATORY CULTURE REPORT TO FOLLOW.   Report Status 12/20/2015 FINAL  Final  Culture, respiratory (NON-Expectorated)     Status: None   Collection Time: 12/20/15 10:53 AM  Result Value Ref Range Status   Specimen Description SPUTUM  Final  Special Requests NONE  Final   Gram Stain   Final    ABUNDANT WBC PRESENT,BOTH PMN AND MONONUCLEAR FEW SQUAMOUS EPITHELIAL CELLS PRESENT MODERATE GRAM POSITIVE COCCI IN PAIRS MODERATE GRAM NEGATIVE RODS FEW GRAM POSITIVE RODS THIS SPECIMEN IS ACCEPTABLE FOR SPUTUM CULTURE Performed at [facility]    Culture   Final    NORMAL OROPHARYNGEAL FLORA Performed at [facility]    Report Status 12/22/2015 FINAL  Final     Labs: Basic Metabolic Panel:  Recent Labs Lab 12/20/15 0306 12/22/15 0307 12/23/15 0919 12/24/15 0448 12/25/15 0330  NA 143 144 141 143 143  K 4.8 5.3* 4.5 4.5 4.2  CL 100* 97* 98* 97* 96*  CO2 35* 35* 34* 38* 40*  GLUCOSE 169* 286* 247* 254* 315*  BUN 22* 47* 40* 40* 39*  CREATININE 1.33* 1.64* 1.45* 1.33* 1.59*  CALCIUM 8.7* 8.4* 9.1 9.1 9.1  MG 2.4  --   --   --   --   PHOS 3.6  --   --   --   --    Liver Function Tests: No results for input(s): AST, ALT, ALKPHOS,  BILITOT, PROT, ALBUMIN in the last 168 hours. No results for input(s): LIPASE, AMYLASE in the last 168 hours. No results for input(s): AMMONIA in the last 168 hours. CBC:  Recent Labs Lab 12/19/15 1326 12/20/15 0306 12/22/15 0306 12/23/15 0919 12/24/15 0448  WBC 6.6 8.2 3.0* 3.6* 4.2  NEUTROABS 5.1  --   --   --   --   HGB 11.4* 11.2* 10.8* 12.2 11.3*  HCT 40.1 38.0 37.8 40.4 38.0  MCV 92.6 92.2 93.1 89.0 91.8  PLT 190 183 189 163 188   Cardiac Enzymes: No results for input(s): CKTOTAL, CKMB, CKMBINDEX, TROPONINI in the last 168 hours. BNP: BNP (last 3 results)  Recent Labs  12/19/15 1308  BNP 36.7    ProBNP (last 3 results) No results for input(s): PROBNP in the last 8760 hours.  CBG:  Recent Labs Lab 12/24/15 0815 12/24/15 1247 12/24/15 1721 12/24/15 2142 12/25/15 0756  GLUCAP 201* 261* 280* 281* 198*    Time coordinating discharge: 35 minutes  Signed:  Katelynd Blauvelt  [facility] 12/25/2015, 11:36 AM

## 2015-12-25 NOTE — Progress Notes (Addendum)
Nursing Note: Pt picked up for transport back to [place].Vitals stable.Pt awake,alert and moved to stretcher and transported w/ Oxygen @ 2L via n/c.Pt"s belongings and cell phone sent with her.wbb

## 2015-12-25 NOTE — Progress Notes (Signed)
PHARMACY CONSULT: Lovenox for VTE prophylaxis  12/25/2015  Wt: 145 kg BMI:  69 Scr:  1.59 CrCl >30 ml/hr  H/H: 11.3/38 Pltc: 189K  A/P:  Due to morbid obesity, continue weight-adjusted lovenox 0.5mg /kg (75 mg) sq q24h  Monitor CBC and renal function, adjust as needed.  Will sign off and follow peripherally  Dolly Rias RPh 12/25/2015, 8:05 AM Pager [phone]

## 2015-12-25 NOTE — Progress Notes (Signed)
Trialed pt without bipap tonight, with 2L O2 via [place]. O2 sats dropped to 84-85% when pt sleeping, sound of pulse ox monitor woke her up and sats would quickly return to mid 90s. Paged respiratory to place bipap on pt. Hortencia Conradi RN

## 2015-12-25 NOTE — Care Management Important Message (Signed)
Important Message  Patient Details IM Letter given to Nora/Case Manager to present to Patient Name: Heather Zimmerman. [place] MRN: [id number] Date of Birth: [birth year]   Medicare Important Message Given:  Yes    Camillo Flaming 12/25/2015, [place] Message  Patient Details  Name: Heather Zimmerman MRN: [id number] Date of Birth: 12-05-1945   Medicare Important Message Given:  Yes    Camillo Flaming 12/25/2015, 9:12 AM

## 2015-12-25 NOTE — NC FL2 (Signed)
[place] LEVEL OF CARE SCREENING TOOL     IDENTIFICATION  Patient Name: Heather Zimmerman. Aug Birthdate: 01-02-1946 Sex: female Admission Date (Current Location): 12/19/2015  [facility] and [place] Number:  Herbalist and Address:  [facility],  [place] [address], [place]      Provider Number: [id number]  Attending Physician Name and Address:  Janece Canterbury, MD  Relative Name and Phone Number:       Current Level of Care: Hospital Recommended Level of Care: [place] Prior Approval Number:    Date Approved/Denied:   PASRR Number: [id number]  Discharge Plan: SNF    Current Diagnoses: Patient Active Problem List   Diagnosis Date Noted  . Hypoxemia   . Candidiasis of perineum 10/26/2015  . Acute on chronic diastolic CHF (congestive heart failure) ([place]) 10/05/2015  . Cellulitis of foot, left 10/05/2015  . AKI (acute kidney injury) ([place])   . DM (diabetes mellitus), type 2 with renal complications ([place]) [id number]  . 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 CKD (chronic kidney disease) stage 3, GFR 30-59 ml/min 07/21/2015  . Acute on chronic respiratory failure ([place]) 07/21/2015  . Cellulitis of leg, left 07/19/2015  . Acute encephalopathy 07/18/2015  . Diabetes mellitus with complication ([place]) 0000000  . Open wound of left lower extremity 07/18/2015  . Acute respiratory failure ([place]) 07/18/2015  . Sepsis ([place]) 07/18/2015  . Urinary retention 07/18/2015  . HCAP (healthcare-associated pneumonia) 07/17/2015  . Influenza with pneumonia 07/17/2015  . Venous ulcer of left leg ([place]) 12/27/2014  . Type II diabetes mellitus with neurological manifestations ([place]) 11/22/2014  . Hx of right BKA ([place]) 11/22/2014  . Peripheral autonomic neuropathy due to diabetes mellitus ([place]) 10/09/2014  . Obstructive sleep apnea 07/05/2014  . Acute on chronic respiratory  failure with hypercapnia ([place]) 06/21/2014  . Pruritic disorder 02/13/2014  . Morbid obesity ([place]) 08/30/2013  . Unspecified vitamin D deficiency 12/31/2012  . Disorder of magnesium metabolism 12/31/2012  . Depression 12/31/2012  . Chronic pain 12/31/2012  . Chronic diastolic heart failure ([place]) 12/31/2012  . Allergic rhinitis due to pollen 12/31/2012  . GERD 12/31/2012    Orientation RESPIRATION BLADDER Height & Weight     Self, Time, Situation, Place  O2, Other (Comment) (2L nasal cannula during the day and bipap at night IP 18, EP 5, backup rage 26, FIO2 0.3) Continent Weight: (!) 318 lb (144.244 kg) Height:  4\' 9"  (144.8 cm)  BEHAVIORAL SYMPTOMS/MOOD NEUROLOGICAL BOWEL NUTRITION STATUS     (NONE) Continent Diet (Diet heart healthy/carb modified )  AMBULATORY STATUS COMMUNICATION OF NEEDS Skin   Extensive Assist Verbally Other (Comment) (diabetic L leg ulcer-dry, scaley, missing skin, bleeding, pink tissue visible, -unna boot)                       Personal Care Assistance Level of Assistance  Bathing, Feeding, Dressing Bathing Assistance: Maximum assistance Feeding assistance: Limited assistance Dressing Assistance: Maximum assistance     Functional Limitations Info             SPECIAL CARE FACTORS FREQUENCY  PT (By licensed PT), OT (By licensed OT)     PT Frequency: 5 x a week OT Frequency: 5 x a week            Contractures Contractures Info: Not present    Additional Factors Info  Code Status, Allergies, Isolation Precautions Code  Status Info: FULL code status Allergies Info: Ace Inhibitors     Isolation Precautions Info: Droplet Precautions: Influenza with pneumonia     Current Medications (12/25/2015):  This is the current hospital active medication list Current Facility-Administered Medications  Medication Dose Route Frequency Provider Last Rate Last Dose  . 0.9 %  sodium chloride infusion  250 mL Intravenous PRN Raylene Miyamoto, MD      .  acetaminophen (TYLENOL) tablet 650 mg  650 mg Oral Q6H PRN Janece Canterbury, MD   650 mg at 12/24/15 2157  . albuterol (PROVENTIL) (2.5 MG/3ML) 0.083% nebulizer solution 2.5 mg  2.5 mg Nebulization Q4H PRN Janece Canterbury, MD      . antiseptic oral rinse (CPC / CETYLPYRIDINIUM CHLORIDE 0.05%) solution 7 mL  7 mL Mouth Rinse q12n4p Raylene Miyamoto, MD   7 mL at 12/23/15 1736  . chlorhexidine (PERIDEX) 0.12 % solution 15 mL  15 mL Mouth Rinse BID Raylene Miyamoto, MD   15 mL at 12/25/15 1131  . DULoxetine (CYMBALTA) DR capsule 20 mg  20 mg Oral Daily Janece Canterbury, MD   20 mg at 12/25/15 1131  . enoxaparin (LOVENOX) injection 75 mg  0.5 mg/kg Subcutaneous Q24H Dara Hoyer, RPH   75 mg at 12/24/15 2025  . fluticasone (FLONASE) 50 MCG/ACT nasal spray 2 spray  2 spray Each Nare Daily Raylene Miyamoto, MD   2 spray at 12/25/15 1135  . gabapentin (NEURONTIN) capsule 600 mg  600 mg Oral BID Raylene Miyamoto, MD   600 mg at 12/25/15 1132  . guaiFENesin (MUCINEX) 12 hr tablet 1,200 mg  1,200 mg Oral BID Janece Canterbury, MD   1,200 mg at 12/25/15 1131  . insulin aspart (novoLOG) injection 0-15 Units  0-15 Units Subcutaneous TID WC Janece Canterbury, MD   3 Units at 12/25/15 0800  . insulin aspart (novoLOG) injection 0-5 Units  0-5 Units Subcutaneous QHS Janece Canterbury, MD   3 Units at 12/24/15 2155  . insulin aspart (novoLOG) injection 7 Units  7 Units Subcutaneous TID WC Janece Canterbury, MD   7 Units at 12/25/15 0800  . insulin detemir (LEVEMIR) injection 26 Units  26 Units Subcutaneous QHS Janece Canterbury, MD      . ipratropium-albuterol (DUONEB) 0.5-2.5 (3) MG/3ML nebulizer solution 3 mL  3 mL Nebulization TID Janece Canterbury, MD   3 mL at 12/25/15 0912  . ondansetron (ZOFRAN) tablet 4 mg  4 mg Oral Q6H PRN Theodis Blaze, MD       Or  . ondansetron [facility]) injection 4 mg  4 mg Intravenous Q6H PRN Theodis Blaze, MD      . pantoprazole (PROTONIX) EC tablet 40 mg  40 mg Oral Daily Raylene Miyamoto, MD   40 mg at 12/25/15 1132  . predniSONE (DELTASONE) tablet 30 mg  30 mg Oral Q breakfast Janece Canterbury, MD   30 mg at 12/25/15 0858  . torsemide (DEMADEX) tablet 20 mg  20 mg Oral BID Janece Canterbury, MD   20 mg at 12/25/15 [id number]     Discharge Medications: Please see discharge summary for a list of discharge medications.  Relevant Imaging Results:  Relevant Lab Results:   Additional Information SS#: [id number]  KIDD, SUZANNA A, LCSW

## 2015-12-25 NOTE — Progress Notes (Signed)
Pt for discharge to [facility].   CSW facilitated pt discharge needs including contacting facility, faxing pt discharge information via epic hub, confirming with [place] that facility could manage bipap at night, providing RN phone number to call report, discussing with pt at bedside, and arranging ambulance transport for pt to [facility] and [place].  CSW noted consult from 3/12 stating pt wanted to explore facilities near [place]. CSW explained to pt that pt would have to explore facilities from [facility] and [place] as pt medically ready for discharge and pt unable to stay in the hospital to seek alternative placement. Pt expressed understanding, but disappointed that she could not remain in the hospital until tomorrow. Pt did not wish for CSW to contact pt brother to notify of discharge.  No further social work needs identified at this time.  CSW signing off.   Alison Murray, MSW, [place] Work [phone]

## 2015-12-26 ENCOUNTER — Non-Acute Institutional Stay (SKILLED_NURSING_FACILITY): Payer: Medicare Other | Admitting: Internal Medicine

## 2015-12-26 ENCOUNTER — Encounter: Payer: Self-pay | Admitting: Internal Medicine

## 2015-12-26 DIAGNOSIS — E662 Morbid (severe) obesity with alveolar hypoventilation: Secondary | ICD-10-CM | POA: Diagnosis not present

## 2015-12-26 DIAGNOSIS — I83029 Varicose veins of left lower extremity with ulcer of unspecified site: Secondary | ICD-10-CM

## 2015-12-26 DIAGNOSIS — L97929 Non-pressure chronic ulcer of unspecified part of left lower leg with unspecified severity: Secondary | ICD-10-CM

## 2015-12-26 DIAGNOSIS — I130 Hypertensive heart and chronic kidney disease with heart failure and stage 1 through stage 4 chronic kidney disease, or unspecified chronic kidney disease: Secondary | ICD-10-CM

## 2015-12-26 DIAGNOSIS — J9612 Chronic respiratory failure with hypercapnia: Secondary | ICD-10-CM | POA: Diagnosis not present

## 2015-12-26 DIAGNOSIS — E1121 Type 2 diabetes mellitus with diabetic nephropathy: Secondary | ICD-10-CM

## 2015-12-26 DIAGNOSIS — R195 Other fecal abnormalities: Secondary | ICD-10-CM | POA: Diagnosis not present

## 2015-12-26 DIAGNOSIS — F32A Depression, unspecified: Secondary | ICD-10-CM

## 2015-12-26 DIAGNOSIS — N183 Chronic kidney disease, stage 3 unspecified: Secondary | ICD-10-CM

## 2015-12-26 DIAGNOSIS — Z794 Long term (current) use of insulin: Secondary | ICD-10-CM | POA: Diagnosis not present

## 2015-12-26 DIAGNOSIS — Z89511 Acquired absence of right leg below knee: Secondary | ICD-10-CM | POA: Diagnosis not present

## 2015-12-26 DIAGNOSIS — G4733 Obstructive sleep apnea (adult) (pediatric): Secondary | ICD-10-CM

## 2015-12-26 DIAGNOSIS — I5032 Chronic diastolic (congestive) heart failure: Secondary | ICD-10-CM

## 2015-12-26 DIAGNOSIS — I509 Heart failure, unspecified: Secondary | ICD-10-CM

## 2015-12-26 DIAGNOSIS — I10 Essential (primary) hypertension: Secondary | ICD-10-CM

## 2015-12-26 DIAGNOSIS — F329 Major depressive disorder, single episode, unspecified: Secondary | ICD-10-CM | POA: Diagnosis not present
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
## 2015-12-29 DIAGNOSIS — I83023 Varicose veins of left lower extremity with ulcer of ankle: Secondary | ICD-10-CM | POA: Diagnosis not present

## 2015-12-29 DIAGNOSIS — I83028 Varicose veins of left lower extremity with ulcer other part of lower leg: Secondary | ICD-10-CM | POA: Diagnosis not present

## 2016-01-03 LAB — BASIC METABOLIC PANEL
BUN: 35 mg/dL — AB (ref 4–21)
CREATININE: 1.7 mg/dL — AB (ref 0.5–1.1)
POTASSIUM: 3.8 mmol/L (ref 3.4–5.3)
SODIUM: 142 mmol/L (ref 137–147)

## 2016-01-05 DIAGNOSIS — I83028 Varicose veins of left lower extremity with ulcer other part of lower leg: Secondary | ICD-10-CM | POA: Diagnosis not present

## 2016-01-05 DIAGNOSIS — I83023 Varicose veins of left lower extremity with ulcer of ankle: Secondary | ICD-10-CM | POA: Diagnosis not present

## 2016-01-12 DIAGNOSIS — I83023 Varicose veins of left lower extremity with ulcer of ankle: Secondary | ICD-10-CM | POA: Diagnosis not present

## 2016-01-12 DIAGNOSIS — I83028 Varicose veins of left lower extremity with ulcer other part of lower leg: Secondary | ICD-10-CM | POA: Diagnosis not present

## 2016-01-15 DIAGNOSIS — Z794 Long term (current) use of insulin: Secondary | ICD-10-CM | POA: Diagnosis not present

## 2016-01-15 DIAGNOSIS — E119 Type 2 diabetes mellitus without complications: Secondary | ICD-10-CM | POA: Diagnosis not present

## 2016-01-15 DIAGNOSIS — H40013 Open angle with borderline findings, low risk, bilateral: Secondary | ICD-10-CM | POA: Diagnosis not present

## 2016-01-15 DIAGNOSIS — H2513 Age-related nuclear cataract, bilateral: Secondary | ICD-10-CM | POA: Diagnosis not present

## 2016-01-19 DIAGNOSIS — I83028 Varicose veins of left lower extremity with ulcer other part of lower leg: Secondary | ICD-10-CM | POA: Diagnosis not present

## 2016-01-19 DIAGNOSIS — I83023 Varicose veins of left lower extremity with ulcer of ankle: Secondary | ICD-10-CM | POA: Diagnosis not present

## 2016-01-24 ENCOUNTER — Encounter: Payer: Self-pay | Admitting: Adult Health

## 2016-01-24 ENCOUNTER — Non-Acute Institutional Stay (SKILLED_NURSING_FACILITY): Payer: Medicare Other | Admitting: Adult Health

## 2016-01-24 DIAGNOSIS — E1149 Type 2 diabetes mellitus with other diabetic neurological complication: Secondary | ICD-10-CM | POA: Diagnosis not present

## 2016-01-24 DIAGNOSIS — L97929 Non-pressure chronic ulcer of unspecified part of left lower leg with unspecified severity: Secondary | ICD-10-CM

## 2016-01-24 DIAGNOSIS — J9621 Acute and chronic respiratory failure with hypoxia: Secondary | ICD-10-CM | POA: Diagnosis not present

## 2016-01-24 DIAGNOSIS — N183 Chronic kidney disease, stage 3 unspecified: Secondary | ICD-10-CM

## 2016-01-24 DIAGNOSIS — Z89511 Acquired absence of right leg below knee: Secondary | ICD-10-CM

## 2016-01-24 DIAGNOSIS — K2100 Gastro-esophageal reflux disease with esophagitis, without bleeding: Secondary | ICD-10-CM

## 2016-01-24 DIAGNOSIS — E1143 Type 2 diabetes mellitus with diabetic autonomic (poly)neuropathy: Secondary | ICD-10-CM | POA: Diagnosis not present

## 2016-01-24 DIAGNOSIS — I5032 Chronic diastolic (congestive) heart failure: Secondary | ICD-10-CM | POA: Diagnosis not present

## 2016-01-24 DIAGNOSIS — I509 Heart failure, unspecified: Secondary | ICD-10-CM

## 2016-01-24 DIAGNOSIS — I83029 Varicose veins of left lower extremity with ulcer of unspecified site: Secondary | ICD-10-CM | POA: Diagnosis not present

## 2016-01-24 DIAGNOSIS — J9622 Acute and chronic respiratory failure with hypercapnia: Secondary | ICD-10-CM | POA: Diagnosis not present

## 2016-01-24 DIAGNOSIS — G4733 Obstructive sleep apnea (adult) (pediatric): Secondary | ICD-10-CM

## 2016-01-24 DIAGNOSIS — I130 Hypertensive heart and chronic kidney disease with heart failure and stage 1 through stage 4 chronic kidney disease, or unspecified chronic kidney disease: Secondary | ICD-10-CM

## 2016-01-24 LAB — BASIC METABOLIC PANEL
BUN: 35 mg/dL — AB (ref 4–21)
CREATININE: 1.7 mg/dL — AB (ref 0.5–1.1)
Glucose: 208 mg/dL
POTASSIUM: 3.8 mmol/L (ref 3.4–5.3)
SODIUM: 142 mmol/L (ref 137–147)
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
## 2016-01-26 DIAGNOSIS — I83022 Varicose veins of left lower extremity with ulcer of calf: Secondary | ICD-10-CM | POA: Diagnosis not present

## 2016-01-26 DIAGNOSIS — I83023 Varicose veins of left lower extremity with ulcer of ankle: Secondary | ICD-10-CM | POA: Diagnosis not present

## 2016-01-26 DIAGNOSIS — I83028 Varicose veins of left lower extremity with ulcer other part of lower leg: Secondary | ICD-10-CM | POA: Diagnosis not present

## 2016-02-02 DIAGNOSIS — I83023 Varicose veins of left lower extremity with ulcer of ankle: Secondary | ICD-10-CM | POA: Diagnosis not present

## 2016-02-02 DIAGNOSIS — I83022 Varicose veins of left lower extremity with ulcer of calf: Secondary | ICD-10-CM | POA: Diagnosis not present

## 2016-02-02 DIAGNOSIS — I83028 Varicose veins of left lower extremity with ulcer other part of lower leg: Secondary | ICD-10-CM | POA: Diagnosis not present

## 2016-02-02 LAB — BASIC METABOLIC PANEL
BUN: 27 mg/dL — AB (ref 4–21)
CREATININE: 1.3 mg/dL — AB (ref 0.5–1.1)
GLUCOSE: 204 mg/dL
POTASSIUM: 3.9 mmol/L (ref 3.4–5.3)
Sodium: 144 mmol/L (ref 137–147)

## 2016-02-05 LAB — TSH: TSH: 2.7 u[IU]/mL (ref 0.41–5.90)

## 2016-02-08 DIAGNOSIS — I83022 Varicose veins of left lower extremity with ulcer of calf: Secondary | ICD-10-CM | POA: Diagnosis not present

## 2016-02-08 DIAGNOSIS — I83028 Varicose veins of left lower extremity with ulcer other part of lower leg: Secondary | ICD-10-CM | POA: Diagnosis not present

## 2016-02-12 DIAGNOSIS — J9621 Acute and chronic respiratory failure with hypoxia: Secondary | ICD-10-CM | POA: Diagnosis not present

## 2016-02-12 DIAGNOSIS — R498 Other voice and resonance disorders: Secondary | ICD-10-CM | POA: Diagnosis not present

## 2016-02-12 DIAGNOSIS — R4781 Slurred speech: Secondary | ICD-10-CM | POA: Diagnosis not present

## 2016-02-13 DIAGNOSIS — R498 Other voice and resonance disorders: Secondary | ICD-10-CM | POA: Diagnosis not present

## 2016-02-13 DIAGNOSIS — R4781 Slurred speech: Secondary | ICD-10-CM | POA: Diagnosis not present

## 2016-02-13 DIAGNOSIS — J9621 Acute and chronic respiratory failure with hypoxia: Secondary | ICD-10-CM | POA: Diagnosis not present

## 2016-02-14 DIAGNOSIS — R4781 Slurred speech: Secondary | ICD-10-CM | POA: Diagnosis not present

## 2016-02-14 DIAGNOSIS — J9621 Acute and chronic respiratory failure with hypoxia: Secondary | ICD-10-CM | POA: Diagnosis not present

## 2016-02-14 DIAGNOSIS — R498 Other voice and resonance disorders: Secondary | ICD-10-CM | POA: Diagnosis not present

## 2016-02-15 DIAGNOSIS — J9621 Acute and chronic respiratory failure with hypoxia: Secondary | ICD-10-CM | POA: Diagnosis not present

## 2016-02-15 DIAGNOSIS — R4781 Slurred speech: Secondary | ICD-10-CM | POA: Diagnosis not present

## 2016-02-15 DIAGNOSIS — I83028 Varicose veins of left lower extremity with ulcer other part of lower leg: Secondary | ICD-10-CM | POA: Diagnosis not present

## 2016-02-15 DIAGNOSIS — R498 Other voice and resonance disorders: Secondary | ICD-10-CM | POA: Diagnosis not present

## 2016-02-20 ENCOUNTER — Encounter: Payer: Self-pay | Admitting: Internal Medicine

## 2016-02-20 ENCOUNTER — Non-Acute Institutional Stay (SKILLED_NURSING_FACILITY): Payer: Medicare Other | Admitting: Internal Medicine

## 2016-02-20 DIAGNOSIS — I83029 Varicose veins of left lower extremity with ulcer of unspecified site: Secondary | ICD-10-CM

## 2016-02-20 DIAGNOSIS — K439 Ventral hernia without obstruction or gangrene: Secondary | ICD-10-CM

## 2016-02-20 DIAGNOSIS — F32A Depression, unspecified: Secondary | ICD-10-CM

## 2016-02-20 DIAGNOSIS — I5032 Chronic diastolic (congestive) heart failure: Secondary | ICD-10-CM | POA: Diagnosis not present

## 2016-02-20 DIAGNOSIS — G8929 Other chronic pain: Secondary | ICD-10-CM

## 2016-02-20 DIAGNOSIS — J9612 Chronic respiratory failure with hypercapnia: Secondary | ICD-10-CM | POA: Diagnosis not present

## 2016-02-20 DIAGNOSIS — I10 Essential (primary) hypertension: Secondary | ICD-10-CM

## 2016-02-20 DIAGNOSIS — F329 Major depressive disorder, single episode, unspecified: Secondary | ICD-10-CM | POA: Diagnosis not present

## 2016-02-20 DIAGNOSIS — E1149 Type 2 diabetes mellitus with other diabetic neurological complication: Secondary | ICD-10-CM

## 2016-02-20 DIAGNOSIS — G4733 Obstructive sleep apnea (adult) (pediatric): Secondary | ICD-10-CM | POA: Diagnosis not present

## 2016-02-20 DIAGNOSIS — L97929 Non-pressure chronic ulcer of unspecified part of left lower leg with unspecified severity: Secondary | ICD-10-CM
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
## 2016-02-23 DIAGNOSIS — I83023 Varicose veins of left lower extremity with ulcer of ankle: Secondary | ICD-10-CM | POA: Diagnosis not present

## 2016-02-23 DIAGNOSIS — I83028 Varicose veins of left lower extremity with ulcer other part of lower leg: Secondary | ICD-10-CM | POA: Diagnosis not present

## 2016-02-24 DIAGNOSIS — I10 Essential (primary) hypertension: Secondary | ICD-10-CM | POA: Insufficient documentation

## 2016-02-24 DIAGNOSIS — J9612 Chronic respiratory failure with hypercapnia: Secondary | ICD-10-CM | POA: Insufficient documentation

## 2016-02-24 DIAGNOSIS — K439 Ventral hernia without obstruction or gangrene: Secondary | ICD-10-CM | POA: Insufficient documentation

## 2016-02-26 DIAGNOSIS — Z139 Encounter for screening, unspecified: Secondary | ICD-10-CM | POA: Diagnosis not present

## 2016-02-26 LAB — TSH: TSH: 2.34 u[IU]/mL (ref <=5.90)

## 2016-02-27 DIAGNOSIS — B351 Tinea unguium: Secondary | ICD-10-CM | POA: Diagnosis not present

## 2016-02-27 DIAGNOSIS — E11622 Type 2 diabetes mellitus with other skin ulcer: Secondary | ICD-10-CM | POA: Diagnosis not present

## 2016-02-27 DIAGNOSIS — Z89511 Acquired absence of right leg below knee: Secondary | ICD-10-CM | POA: Diagnosis not present

## 2016-02-27 DIAGNOSIS — I739 Peripheral vascular disease, unspecified: Secondary | ICD-10-CM | POA: Diagnosis not present

## 2016-03-01 DIAGNOSIS — I83028 Varicose veins of left lower extremity with ulcer other part of lower leg: Secondary | ICD-10-CM | POA: Diagnosis not present

## 2016-03-01 DIAGNOSIS — I83023 Varicose veins of left lower extremity with ulcer of ankle: Secondary | ICD-10-CM | POA: Diagnosis not present

## 2016-03-07 DIAGNOSIS — I83023 Varicose veins of left lower extremity with ulcer of ankle: Secondary | ICD-10-CM | POA: Diagnosis not present

## 2016-03-15 DIAGNOSIS — I83028 Varicose veins of left lower extremity with ulcer other part of lower leg: Secondary | ICD-10-CM | POA: Diagnosis not present

## 2016-03-21 ENCOUNTER — Encounter: Payer: Self-pay | Admitting: Adult Health

## 2016-03-21 ENCOUNTER — Non-Acute Institutional Stay (SKILLED_NURSING_FACILITY): Payer: Medicare Other | Admitting: Adult Health

## 2016-03-21 DIAGNOSIS — Z89511 Acquired absence of right leg below knee: Secondary | ICD-10-CM

## 2016-03-21 DIAGNOSIS — I83029 Varicose veins of left lower extremity with ulcer of unspecified site: Secondary | ICD-10-CM | POA: Diagnosis not present

## 2016-03-21 DIAGNOSIS — I130 Hypertensive heart and chronic kidney disease with heart failure and stage 1 through stage 4 chronic kidney disease, or unspecified chronic kidney disease: Secondary | ICD-10-CM

## 2016-03-21 DIAGNOSIS — G4733 Obstructive sleep apnea (adult) (pediatric): Secondary | ICD-10-CM

## 2016-03-21 DIAGNOSIS — E1149 Type 2 diabetes mellitus with other diabetic neurological complication: Secondary | ICD-10-CM | POA: Diagnosis not present

## 2016-03-21 DIAGNOSIS — E1143 Type 2 diabetes mellitus with diabetic autonomic (poly)neuropathy: Secondary | ICD-10-CM | POA: Diagnosis not present

## 2016-03-21 DIAGNOSIS — I5032 Chronic diastolic (congestive) heart failure: Secondary | ICD-10-CM

## 2016-03-21 DIAGNOSIS — I509 Heart failure, unspecified: Secondary | ICD-10-CM

## 2016-03-21 DIAGNOSIS — N183 Chronic kidney disease, stage 3 unspecified: Secondary | ICD-10-CM

## 2016-03-21 DIAGNOSIS — J9612 Chronic respiratory failure with hypercapnia: Secondary | ICD-10-CM

## 2016-03-21 DIAGNOSIS — L97929 Non-pressure chronic ulcer of unspecified part of left lower leg with unspecified severity: Secondary | ICD-10-CM

## 2016-03-22 DIAGNOSIS — I83028 Varicose veins of left lower extremity with ulcer other part of lower leg: Secondary | ICD-10-CM | POA: Diagnosis not present

## 2016-03-28 DIAGNOSIS — I83028 Varicose veins of left lower extremity with ulcer other part of lower leg: Secondary | ICD-10-CM | POA: Diagnosis not present

## 2016-04-01 ENCOUNTER — Non-Acute Institutional Stay (SKILLED_NURSING_FACILITY): Payer: Medicare Other | Admitting: Adult Health

## 2016-04-01 DIAGNOSIS — E1149 Type 2 diabetes mellitus with other diabetic neurological complication: Secondary | ICD-10-CM

## 2016-04-03 DIAGNOSIS — E119 Type 2 diabetes mellitus without complications: Secondary | ICD-10-CM | POA: Diagnosis not present

## 2016-04-03 DIAGNOSIS — I509 Heart failure, unspecified: Secondary | ICD-10-CM | POA: Diagnosis not present

## 2016-04-03 LAB — HEMOGLOBIN A1C: Hemoglobin A1C: 10.1

## 2016-04-04 ENCOUNTER — Encounter: Payer: Self-pay | Admitting: Adult Health

## 2016-04-04 ENCOUNTER — Non-Acute Institutional Stay (SKILLED_NURSING_FACILITY): Payer: Medicare Other | Admitting: Adult Health

## 2016-04-04 DIAGNOSIS — F329 Major depressive disorder, single episode, unspecified: Secondary | ICD-10-CM | POA: Diagnosis not present

## 2016-04-04 DIAGNOSIS — I83029 Varicose veins of left lower extremity with ulcer of unspecified site: Secondary | ICD-10-CM | POA: Diagnosis not present

## 2016-04-04 DIAGNOSIS — I83022 Varicose veins of left lower extremity with ulcer of calf: Secondary | ICD-10-CM | POA: Diagnosis not present

## 2016-04-04 DIAGNOSIS — L97929 Non-pressure chronic ulcer of unspecified part of left lower leg with unspecified severity: Principal | ICD-10-CM

## 2016-04-04 LAB — BASIC METABOLIC PANEL: GLUCOSE: 359 mg/dL
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
## 2016-04-10 DIAGNOSIS — I83028 Varicose veins of left lower extremity with ulcer other part of lower leg: Secondary | ICD-10-CM | POA: Diagnosis not present

## 2016-04-10 DIAGNOSIS — I83022 Varicose veins of left lower extremity with ulcer of calf: Secondary | ICD-10-CM | POA: Diagnosis not present
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
## 2016-04-11 ENCOUNTER — Encounter: Payer: Self-pay | Admitting: Adult Health
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
## 2016-04-15 ENCOUNTER — Non-Acute Institutional Stay (SKILLED_NURSING_FACILITY): Payer: Medicare Other | Admitting: Adult Health

## 2016-04-15 ENCOUNTER — Encounter: Payer: Self-pay | Admitting: Adult Health

## 2016-04-15 DIAGNOSIS — E1149 Type 2 diabetes mellitus with other diabetic neurological complication: Secondary | ICD-10-CM | POA: Diagnosis not present

## 2016-04-15 DIAGNOSIS — IMO0002 Reserved for concepts with insufficient information to code with codable children: Secondary | ICD-10-CM

## 2016-04-15 DIAGNOSIS — Z794 Long term (current) use of insulin: Secondary | ICD-10-CM

## 2016-04-15 DIAGNOSIS — E1165 Type 2 diabetes mellitus with hyperglycemia: Secondary | ICD-10-CM | POA: Diagnosis not present
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
## 2016-04-18 DIAGNOSIS — I83022 Varicose veins of left lower extremity with ulcer of calf: Secondary | ICD-10-CM | POA: Diagnosis not present

## 2016-04-18 DIAGNOSIS — I83028 Varicose veins of left lower extremity with ulcer other part of lower leg: Secondary | ICD-10-CM | POA: Diagnosis not present

## 2016-04-23 DIAGNOSIS — E1149 Type 2 diabetes mellitus with other diabetic neurological complication: Secondary | ICD-10-CM | POA: Insufficient documentation

## 2016-04-23 DIAGNOSIS — E1165 Type 2 diabetes mellitus with hyperglycemia: Secondary | ICD-10-CM

## 2016-04-23 DIAGNOSIS — IMO0002 Reserved for concepts with insufficient information to code with codable children: Secondary | ICD-10-CM | POA: Insufficient documentation

## 2016-04-25 ENCOUNTER — Non-Acute Institutional Stay (SKILLED_NURSING_FACILITY): Payer: Medicare Other | Admitting: Adult Health

## 2016-04-25 ENCOUNTER — Encounter: Payer: Self-pay | Admitting: Adult Health

## 2016-04-25 DIAGNOSIS — IMO0002 Reserved for concepts with insufficient information to code with codable children: Secondary | ICD-10-CM

## 2016-04-25 DIAGNOSIS — E1143 Type 2 diabetes mellitus with diabetic autonomic (poly)neuropathy: Secondary | ICD-10-CM | POA: Diagnosis not present

## 2016-04-25 DIAGNOSIS — I5032 Chronic diastolic (congestive) heart failure: Secondary | ICD-10-CM

## 2016-04-25 DIAGNOSIS — I130 Hypertensive heart and chronic kidney disease with heart failure and stage 1 through stage 4 chronic kidney disease, or unspecified chronic kidney disease: Secondary | ICD-10-CM | POA: Diagnosis not present

## 2016-04-25 DIAGNOSIS — Z89511 Acquired absence of right leg below knee: Secondary | ICD-10-CM | POA: Diagnosis not present

## 2016-04-25 DIAGNOSIS — I83029 Varicose veins of left lower extremity with ulcer of unspecified site: Secondary | ICD-10-CM

## 2016-04-25 DIAGNOSIS — I509 Heart failure, unspecified: Secondary | ICD-10-CM | POA: Diagnosis not present

## 2016-04-25 DIAGNOSIS — E1165 Type 2 diabetes mellitus with hyperglycemia: Secondary | ICD-10-CM

## 2016-04-25 DIAGNOSIS — J9612 Chronic respiratory failure with hypercapnia: Secondary | ICD-10-CM | POA: Diagnosis not present

## 2016-04-25 DIAGNOSIS — I83022 Varicose veins of left lower extremity with ulcer of calf: Secondary | ICD-10-CM | POA: Diagnosis not present

## 2016-04-25 DIAGNOSIS — Z794 Long term (current) use of insulin: Secondary | ICD-10-CM

## 2016-04-25 DIAGNOSIS — E1149 Type 2 diabetes mellitus with other diabetic neurological complication: Secondary | ICD-10-CM

## 2016-04-25 DIAGNOSIS — I83028 Varicose veins of left lower extremity with ulcer other part of lower leg: Secondary | ICD-10-CM | POA: Diagnosis not present

## 2016-04-25 DIAGNOSIS — N183 Chronic kidney disease, stage 3 (moderate): Secondary | ICD-10-CM | POA: Diagnosis not present

## 2016-04-25 DIAGNOSIS — L97929 Non-pressure chronic ulcer of unspecified part of left lower leg with unspecified severity: Secondary | ICD-10-CM
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
## 2016-05-03 DIAGNOSIS — I83022 Varicose veins of left lower extremity with ulcer of calf: Secondary | ICD-10-CM | POA: Diagnosis not present

## 2016-05-03 DIAGNOSIS — I83028 Varicose veins of left lower extremity with ulcer other part of lower leg: Secondary | ICD-10-CM | POA: Diagnosis not present

## 2016-05-10 ENCOUNTER — Encounter: Payer: Self-pay | Admitting: Adult Health

## 2016-05-10 DIAGNOSIS — I83028 Varicose veins of left lower extremity with ulcer other part of lower leg: Secondary | ICD-10-CM | POA: Diagnosis not present

## 2016-05-19 DIAGNOSIS — I83022 Varicose veins of left lower extremity with ulcer of calf: Secondary | ICD-10-CM | POA: Diagnosis not present

## 2016-05-23 ENCOUNTER — Encounter: Payer: Self-pay | Admitting: Adult Health

## 2016-05-23 ENCOUNTER — Non-Acute Institutional Stay (SKILLED_NURSING_FACILITY): Payer: Medicare Other | Admitting: Adult Health

## 2016-05-23 DIAGNOSIS — Z794 Long term (current) use of insulin: Secondary | ICD-10-CM | POA: Diagnosis not present

## 2016-05-23 DIAGNOSIS — I83022 Varicose veins of left lower extremity with ulcer of calf: Secondary | ICD-10-CM | POA: Diagnosis not present

## 2016-05-23 DIAGNOSIS — L97929 Non-pressure chronic ulcer of unspecified part of left lower leg with unspecified severity: Secondary | ICD-10-CM

## 2016-05-23 DIAGNOSIS — G4733 Obstructive sleep apnea (adult) (pediatric): Secondary | ICD-10-CM

## 2016-05-23 DIAGNOSIS — E1149 Type 2 diabetes mellitus with other diabetic neurological complication: Secondary | ICD-10-CM

## 2016-05-23 DIAGNOSIS — E1165 Type 2 diabetes mellitus with hyperglycemia: Secondary | ICD-10-CM

## 2016-05-23 DIAGNOSIS — I130 Hypertensive heart and chronic kidney disease with heart failure and stage 1 through stage 4 chronic kidney disease, or unspecified chronic kidney disease: Secondary | ICD-10-CM | POA: Diagnosis not present

## 2016-05-23 DIAGNOSIS — I83029 Varicose veins of left lower extremity with ulcer of unspecified site: Secondary | ICD-10-CM

## 2016-05-23 DIAGNOSIS — N183 Chronic kidney disease, stage 3 unspecified: Secondary | ICD-10-CM

## 2016-05-23 DIAGNOSIS — J9612 Chronic respiratory failure with hypercapnia: Secondary | ICD-10-CM

## 2016-05-23 DIAGNOSIS — I83028 Varicose veins of left lower extremity with ulcer other part of lower leg: Secondary | ICD-10-CM | POA: Diagnosis not present

## 2016-05-23 DIAGNOSIS — I509 Heart failure, unspecified: Secondary | ICD-10-CM | POA: Diagnosis not present

## 2016-05-23 DIAGNOSIS — I5032 Chronic diastolic (congestive) heart failure: Secondary | ICD-10-CM | POA: Diagnosis not present

## 2016-05-23 DIAGNOSIS — E1143 Type 2 diabetes mellitus with diabetic autonomic (poly)neuropathy: Secondary | ICD-10-CM | POA: Diagnosis not present

## 2016-05-23 DIAGNOSIS — IMO0002 Reserved for concepts with insufficient information to code with codable children: Secondary | ICD-10-CM
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
## 2016-05-24 DIAGNOSIS — I739 Peripheral vascular disease, unspecified: Secondary | ICD-10-CM | POA: Diagnosis not present

## 2016-05-24 DIAGNOSIS — B351 Tinea unguium: Secondary | ICD-10-CM | POA: Diagnosis not present

## 2016-05-24 DIAGNOSIS — M79672 Pain in left foot: Secondary | ICD-10-CM | POA: Diagnosis not present

## 2016-05-24 DIAGNOSIS — E119 Type 2 diabetes mellitus without complications: Secondary | ICD-10-CM | POA: Diagnosis not present

## 2016-05-24 DIAGNOSIS — E785 Hyperlipidemia, unspecified: Secondary | ICD-10-CM | POA: Diagnosis not present

## 2016-05-31 DIAGNOSIS — I83028 Varicose veins of left lower extremity with ulcer other part of lower leg: Secondary | ICD-10-CM | POA: Diagnosis not present

## 2016-05-31 DIAGNOSIS — I83023 Varicose veins of left lower extremity with ulcer of ankle: Secondary | ICD-10-CM | POA: Diagnosis not present

## 2016-06-06 DIAGNOSIS — I83022 Varicose veins of left lower extremity with ulcer of calf: Secondary | ICD-10-CM | POA: Diagnosis not present

## 2016-06-06 DIAGNOSIS — I83023 Varicose veins of left lower extremity with ulcer of ankle: Secondary | ICD-10-CM | POA: Diagnosis not present

## 2016-06-13 DIAGNOSIS — I83028 Varicose veins of left lower extremity with ulcer other part of lower leg: Secondary | ICD-10-CM | POA: Diagnosis not present

## 2016-06-13 DIAGNOSIS — I83023 Varicose veins of left lower extremity with ulcer of ankle: Secondary | ICD-10-CM | POA: Diagnosis not present

## 2016-06-20 DIAGNOSIS — I83022 Varicose veins of left lower extremity with ulcer of calf: Secondary | ICD-10-CM | POA: Diagnosis not present

## 2016-06-20 DIAGNOSIS — I83023 Varicose veins of left lower extremity with ulcer of ankle: Secondary | ICD-10-CM | POA: Diagnosis not present

## 2016-06-24 ENCOUNTER — Non-Acute Institutional Stay (SKILLED_NURSING_FACILITY): Payer: Medicare Other | Admitting: Adult Health

## 2016-06-24 ENCOUNTER — Encounter: Payer: Self-pay | Admitting: Adult Health

## 2016-06-24 DIAGNOSIS — I83029 Varicose veins of left lower extremity with ulcer of unspecified site: Secondary | ICD-10-CM | POA: Diagnosis not present

## 2016-06-24 DIAGNOSIS — J9612 Chronic respiratory failure with hypercapnia: Secondary | ICD-10-CM

## 2016-06-24 DIAGNOSIS — I130 Hypertensive heart and chronic kidney disease with heart failure and stage 1 through stage 4 chronic kidney disease, or unspecified chronic kidney disease: Secondary | ICD-10-CM | POA: Diagnosis not present

## 2016-06-24 DIAGNOSIS — I5032 Chronic diastolic (congestive) heart failure: Secondary | ICD-10-CM | POA: Diagnosis not present

## 2016-06-24 DIAGNOSIS — I509 Heart failure, unspecified: Secondary | ICD-10-CM

## 2016-06-24 DIAGNOSIS — Z794 Long term (current) use of insulin: Secondary | ICD-10-CM

## 2016-06-24 DIAGNOSIS — N183 Chronic kidney disease, stage 3 unspecified: Secondary | ICD-10-CM

## 2016-06-24 DIAGNOSIS — E1143 Type 2 diabetes mellitus with diabetic autonomic (poly)neuropathy: Secondary | ICD-10-CM | POA: Diagnosis not present

## 2016-06-24 DIAGNOSIS — E1165 Type 2 diabetes mellitus with hyperglycemia: Secondary | ICD-10-CM

## 2016-06-24 DIAGNOSIS — E1149 Type 2 diabetes mellitus with other diabetic neurological complication: Secondary | ICD-10-CM | POA: Diagnosis not present

## 2016-06-24 DIAGNOSIS — G4733 Obstructive sleep apnea (adult) (pediatric): Secondary | ICD-10-CM | POA: Diagnosis not present

## 2016-06-24 DIAGNOSIS — L97929 Non-pressure chronic ulcer of unspecified part of left lower leg with unspecified severity: Secondary | ICD-10-CM

## 2016-06-24 DIAGNOSIS — IMO0002 Reserved for concepts with insufficient information to code with codable children: Secondary | ICD-10-CM
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
## 2016-06-26 DIAGNOSIS — E089 Diabetes mellitus due to underlying condition without complications: Secondary | ICD-10-CM | POA: Diagnosis not present

## 2016-06-26 DIAGNOSIS — E785 Hyperlipidemia, unspecified: Secondary | ICD-10-CM | POA: Diagnosis not present

## 2016-06-26 LAB — CBC AND DIFFERENTIAL
HEMATOCRIT: 39 % (ref 36–46)
Hemoglobin: 11.7 g/dL — AB (ref 12.0–16.0)
PLATELETS: 217 10*3/uL (ref 150–399)
WBC: 5.3 10*3/mL

## 2016-06-26 LAB — HEPATIC FUNCTION PANEL
ALK PHOS: 0.5 U/L — AB (ref 25–125)
ALT: 14 U/L (ref 7–35)
AST: 18 U/L (ref 13–35)

## 2016-06-26 LAB — BASIC METABOLIC PANEL
BUN: 33 mg/dL — AB (ref 4–21)
CREATININE: 1.3 mg/dL — AB (ref 0.5–1.1)
Glucose: 104 mg/dL
Potassium: 4 mmol/L (ref 3.4–5.3)
Sodium: 146 mmol/L (ref 137–147)

## 2016-06-26 LAB — LIPID PANEL
CHOLESTEROL: 159 mg/dL (ref 0–200)
HDL: 45 mg/dL (ref 35–70)
LDL CALC: 91 mg/dL
TRIGLYCERIDES: 116 mg/dL (ref 40–160)

## 2016-06-26 LAB — HEMOGLOBIN A1C: Hemoglobin A1C: 7.2

## 2016-06-27 DIAGNOSIS — I83023 Varicose veins of left lower extremity with ulcer of ankle: Secondary | ICD-10-CM | POA: Diagnosis not present

## 2016-06-27 DIAGNOSIS — I83022 Varicose veins of left lower extremity with ulcer of calf: Secondary | ICD-10-CM | POA: Diagnosis not present

## 2016-07-04 DIAGNOSIS — I83028 Varicose veins of left lower extremity with ulcer other part of lower leg: Secondary | ICD-10-CM | POA: Diagnosis not present

## 2016-07-04 DIAGNOSIS — I83023 Varicose veins of left lower extremity with ulcer of ankle: Secondary | ICD-10-CM | POA: Diagnosis not present

## 2016-07-07 ENCOUNTER — Emergency Department (HOSPITAL_COMMUNITY): Payer: Medicare Other

## 2016-07-07 ENCOUNTER — Emergency Department (HOSPITAL_COMMUNITY)
Admission: EM | Admit: 2016-07-07 | Discharge: 2016-07-07 | Disposition: A | Discharge status: Home / Self Care | Payer: Medicare Other | Attending: Emergency Medicine | Admitting: Emergency Medicine

## 2016-07-07 ENCOUNTER — Encounter (HOSPITAL_COMMUNITY): Payer: Self-pay | Admitting: *Deleted

## 2016-07-07 DIAGNOSIS — Z79899 Other long term (current) drug therapy: Secondary | ICD-10-CM | POA: Insufficient documentation

## 2016-07-07 DIAGNOSIS — R069 Unspecified abnormalities of breathing: Secondary | ICD-10-CM | POA: Diagnosis not present

## 2016-07-07 DIAGNOSIS — E162 Hypoglycemia, unspecified: Secondary | ICD-10-CM

## 2016-07-07 DIAGNOSIS — Z7982 Long term (current) use of aspirin: Secondary | ICD-10-CM | POA: Diagnosis not present

## 2016-07-07 DIAGNOSIS — R0989 Other specified symptoms and signs involving the circulatory and respiratory systems: Secondary | ICD-10-CM

## 2016-07-07 DIAGNOSIS — E11649 Type 2 diabetes mellitus with hypoglycemia without coma: Secondary | ICD-10-CM | POA: Diagnosis not present

## 2016-07-07 DIAGNOSIS — I5032 Chronic diastolic (congestive) heart failure: Secondary | ICD-10-CM | POA: Insufficient documentation

## 2016-07-07 DIAGNOSIS — I509 Heart failure, unspecified: Secondary | ICD-10-CM | POA: Diagnosis not present

## 2016-07-07 DIAGNOSIS — N183 Chronic kidney disease, stage 3 (moderate): Secondary | ICD-10-CM | POA: Insufficient documentation

## 2016-07-07 DIAGNOSIS — R51 Headache: Secondary | ICD-10-CM | POA: Diagnosis not present

## 2016-07-07 DIAGNOSIS — I130 Hypertensive heart and chronic kidney disease with heart failure and stage 1 through stage 4 chronic kidney disease, or unspecified chronic kidney disease: Secondary | ICD-10-CM | POA: Insufficient documentation

## 2016-07-07 DIAGNOSIS — J80 Acute respiratory distress syndrome: Secondary | ICD-10-CM | POA: Diagnosis not present

## 2016-07-07 DIAGNOSIS — G4489 Other headache syndrome: Secondary | ICD-10-CM | POA: Diagnosis not present

## 2016-07-07 DIAGNOSIS — Z794 Long term (current) use of insulin: Secondary | ICD-10-CM | POA: Diagnosis not present

## 2016-07-07 LAB — COMPREHENSIVE METABOLIC PANEL
ALBUMIN: 3.1 g/dL — AB (ref 3.5–5.0)
ALK PHOS: 69 U/L (ref 38–126)
ALT: 14 U/L (ref 14–54)
ANION GAP: 7 (ref 5–15)
AST: 17 U/L (ref 15–41)
BILIRUBIN TOTAL: 0.7 mg/dL (ref 0.3–1.2)
BUN: 33 mg/dL — ABNORMAL HIGH (ref 6–20)
CALCIUM: 8.9 mg/dL (ref 8.9–10.3)
CO2: 48 mmol/L — ABNORMAL HIGH (ref 22–32)
Chloride: 93 mmol/L — ABNORMAL LOW (ref 101–111)
Creatinine, Ser: 1.46 mg/dL — ABNORMAL HIGH (ref 0.44–1.00)
GFR calc non Af Amer: 35 mL/min — ABNORMAL LOW (ref >60)
GFR, EST AFRICAN AMERICAN: 41 mL/min — AB (ref >60)
GLUCOSE: 46 mg/dL — AB (ref 65–99)
Potassium: 4 mmol/L (ref 3.5–5.1)
Sodium: 148 mmol/L — ABNORMAL HIGH (ref 135–145)
TOTAL PROTEIN: 8.3 g/dL — AB (ref 6.5–8.1)

## 2016-07-07 LAB — I-STAT CHEM 8, ED
BUN: 34 mg/dL — AB (ref 6–20)
CALCIUM ION: 1.01 mmol/L — AB (ref 1.15–1.40)
CREATININE: 1.5 mg/dL — AB (ref 0.44–1.00)
Chloride: 89 mmol/L — ABNORMAL LOW (ref 101–111)
Glucose, Bld: 46 mg/dL — ABNORMAL LOW (ref 65–99)
HCT: 42 % (ref 36.0–46.0)
Hemoglobin: 14.3 g/dL (ref 12.0–15.0)
Potassium: 3.8 mmol/L (ref 3.5–5.1)
Sodium: 146 mmol/L — ABNORMAL HIGH (ref 135–145)
TCO2: 48 mmol/L (ref 0–100)

## 2016-07-07 LAB — BRAIN NATRIURETIC PEPTIDE: B Natriuretic Peptide: 39.7 pg/mL (ref 0.0–100.0)

## 2016-07-07 LAB — CBC
HEMATOCRIT: 41.8 % (ref 36.0–46.0)
HEMOGLOBIN: 11.6 g/dL — AB (ref 12.0–15.0)
MCH: 26.7 pg (ref 26.0–34.0)
MCHC: 27.8 g/dL — ABNORMAL LOW (ref 30.0–36.0)
MCV: 96.1 fL (ref 78.0–100.0)
Platelets: 217 10*3/uL (ref 150–400)
RBC: 4.35 MIL/uL (ref 3.87–5.11)
RDW: 17.5 % — ABNORMAL HIGH (ref 11.5–15.5)
WBC: 6 10*3/uL (ref 4.0–10.5)

## 2016-07-07 LAB — DIFFERENTIAL
Basophils Absolute: 0 10*3/uL (ref 0.0–0.1)
Basophils Relative: 0 %
EOS PCT: 4 %
Eosinophils Absolute: 0.2 10*3/uL (ref 0.0–0.7)
LYMPHS ABS: 1.4 10*3/uL (ref 0.7–4.0)
LYMPHS PCT: 23 %
MONO ABS: 0.6 10*3/uL (ref 0.1–1.0)
MONOS PCT: 10 %
Neutro Abs: 3.8 10*3/uL (ref 1.7–7.7)
Neutrophils Relative %: 63 %

## 2016-07-07 LAB — URINALYSIS, ROUTINE W REFLEX MICROSCOPIC
Bilirubin Urine: NEGATIVE
Glucose, UA: NEGATIVE mg/dL
Hgb urine dipstick: NEGATIVE
KETONES UR: NEGATIVE mg/dL
LEUKOCYTES UA: NEGATIVE
NITRITE: NEGATIVE
PH: 6.5 (ref 5.0–8.0)
PROTEIN: NEGATIVE mg/dL
Specific Gravity, Urine: 1.01 (ref 1.005–1.030)

## 2016-07-07 LAB — PROTIME-INR
INR: 1.1
Prothrombin Time: 14.3 seconds (ref 11.4–15.2)

## 2016-07-07 LAB — RAPID URINE DRUG SCREEN, HOSP PERFORMED
Amphetamines: NOT DETECTED
Barbiturates: NOT DETECTED
Benzodiazepines: NOT DETECTED
Cocaine: NOT DETECTED
OPIATES: NOT DETECTED
TETRAHYDROCANNABINOL: NOT DETECTED

## 2016-07-07 LAB — I-STAT TROPONIN, ED: Troponin i, poc: 0.01 ng/mL (ref 0.00–0.08)

## 2016-07-07 LAB — CBG MONITORING, ED: GLUCOSE-CAPILLARY: 76 mg/dL (ref 65–99)

## 2016-07-07 LAB — APTT: aPTT: 36 seconds (ref 24–36)

## 2016-07-07 LAB — ETHANOL: Alcohol, Ethyl (B): <5 mg/dL (ref <5)

## 2016-07-07 MED ORDER — FUROSEMIDE 10 MG/ML IJ SOLN
40.0000 mg | INTRAMUSCULAR | Status: AC
  Administered 2016-07-07: 40 mg via INTRAVENOUS
  Filled 2016-07-07: qty 4

## 2016-07-07 MED ORDER — DEXTROSE 50 % IV SOLN
1.0000 | Freq: Once | INTRAVENOUS | Status: AC
  Administered 2016-07-07: 50 mL via INTRAVENOUS
  Filled 2016-07-07: qty 50

## 2016-07-07 NOTE — Discharge Instructions (Signed)
Your Emergency Department workup is complete.  Your CT scan is negative for stroke.  You chest x-ray shows signs of mild CHF (congestive heart failure), which is the probable cause for your congestion.  You were give some lasix in the ER to treat this.  Your blood sugar was also found to be slightly low, you were give glucose for this.  Please follow-up with your doctor in 3 days.  Please carefully monitor you blood sugar.

## 2016-07-07 NOTE — ED Notes (Signed)
PT DISCHARGED. INSTRUCTIONS GIVEN. AAOX4. PT IN NO APPARENT DISTRESS OR PAIN. THE OPPORTUNITY TO ASK QUESTIONS WAS PROVIDED. 

## 2016-07-07 NOTE — ED Notes (Addendum)
I stat specimen bad, system is asking for new sample.

## 2016-07-07 NOTE — ED Notes (Signed)
Bed: [facility] Expected date:  Expected time:  Means of arrival:  Comments: EMS 70 yo, nausea

## 2016-07-07 NOTE — ED Triage Notes (Signed)
Pt resting with eyes closed, pt showing no signes of distress.
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
## 2016-07-07 NOTE — ED Notes (Addendum)
IV ATTEMPT X1 SUCCESSFUL, HOWEVER, NOT ENOUGH BLOOD COLLECTED FOR LABS. PHLEBOTOMY MADE AWARE.

## 2016-07-07 NOTE — ED Triage Notes (Signed)
Pt brought form EMS from [facility] and [place]. Family pt did not like what facility MD told her, wants 2nd opinion. Congestion reason of concern and headache.

## 2016-07-08 ENCOUNTER — Non-Acute Institutional Stay (SKILLED_NURSING_FACILITY): Payer: Medicare Other | Admitting: Adult Health

## 2016-07-08 ENCOUNTER — Encounter: Payer: Self-pay | Admitting: Adult Health

## 2016-07-08 DIAGNOSIS — R29898 Other symptoms and signs involving the musculoskeletal system: Secondary | ICD-10-CM | POA: Diagnosis not present

## 2016-07-08 DIAGNOSIS — I5032 Chronic diastolic (congestive) heart failure: Secondary | ICD-10-CM

## 2016-07-08 LAB — BASIC METABOLIC PANEL: Glucose: 181 mg/dL
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
## 2016-07-09 DIAGNOSIS — R29898 Other symptoms and signs involving the musculoskeletal system: Secondary | ICD-10-CM | POA: Insufficient documentation

## 2016-07-18 DIAGNOSIS — I83028 Varicose veins of left lower extremity with ulcer other part of lower leg: Secondary | ICD-10-CM | POA: Diagnosis not present

## 2016-07-18 DIAGNOSIS — I83023 Varicose veins of left lower extremity with ulcer of ankle: Secondary | ICD-10-CM | POA: Diagnosis not present

## 2016-07-19 ENCOUNTER — Inpatient Hospital Stay (HOSPITAL_COMMUNITY)
Admission: EM | Admit: 2016-07-19 | Discharge: 2016-08-01 | DRG: 870 | Disposition: A | Discharge status: Skilled Nursing Facility | Payer: Medicare Other | Attending: Internal Medicine | Admitting: Internal Medicine

## 2016-07-19 ENCOUNTER — Emergency Department (HOSPITAL_COMMUNITY): Payer: Medicare Other

## 2016-07-19 ENCOUNTER — Encounter (HOSPITAL_COMMUNITY): Payer: Self-pay | Admitting: Emergency Medicine

## 2016-07-19 DIAGNOSIS — I83009 Varicose veins of unspecified lower extremity with ulcer of unspecified site: Secondary | ICD-10-CM | POA: Diagnosis present

## 2016-07-19 DIAGNOSIS — E11649 Type 2 diabetes mellitus with hypoglycemia without coma: Secondary | ICD-10-CM | POA: Diagnosis present

## 2016-07-19 DIAGNOSIS — Z7401 Bed confinement status: Secondary | ICD-10-CM

## 2016-07-19 DIAGNOSIS — R1312 Dysphagia, oropharyngeal phase: Secondary | ICD-10-CM | POA: Diagnosis present

## 2016-07-19 DIAGNOSIS — J9602 Acute respiratory failure with hypercapnia: Secondary | ICD-10-CM | POA: Diagnosis present

## 2016-07-19 DIAGNOSIS — Z794 Long term (current) use of insulin: Secondary | ICD-10-CM

## 2016-07-19 DIAGNOSIS — R069 Unspecified abnormalities of breathing: Secondary | ICD-10-CM | POA: Diagnosis not present

## 2016-07-19 DIAGNOSIS — J9621 Acute and chronic respiratory failure with hypoxia: Secondary | ICD-10-CM | POA: Diagnosis present

## 2016-07-19 DIAGNOSIS — I5033 Acute on chronic diastolic (congestive) heart failure: Secondary | ICD-10-CM | POA: Diagnosis present

## 2016-07-19 DIAGNOSIS — G9341 Metabolic encephalopathy: Secondary | ICD-10-CM | POA: Diagnosis present

## 2016-07-19 DIAGNOSIS — K219 Gastro-esophageal reflux disease without esophagitis: Secondary | ICD-10-CM | POA: Diagnosis present

## 2016-07-19 DIAGNOSIS — R509 Fever, unspecified: Secondary | ICD-10-CM

## 2016-07-19 DIAGNOSIS — E1151 Type 2 diabetes mellitus with diabetic peripheral angiopathy without gangrene: Secondary | ICD-10-CM | POA: Diagnosis present

## 2016-07-19 DIAGNOSIS — E162 Hypoglycemia, unspecified: Secondary | ICD-10-CM

## 2016-07-19 DIAGNOSIS — J9690 Respiratory failure, unspecified, unspecified whether with hypoxia or hypercapnia: Secondary | ICD-10-CM | POA: Diagnosis present

## 2016-07-19 DIAGNOSIS — D6489 Other specified anemias: Secondary | ICD-10-CM | POA: Diagnosis present

## 2016-07-19 DIAGNOSIS — L03116 Cellulitis of left lower limb: Secondary | ICD-10-CM | POA: Diagnosis present

## 2016-07-19 DIAGNOSIS — E872 Acidosis: Secondary | ICD-10-CM | POA: Diagnosis present

## 2016-07-19 DIAGNOSIS — E11622 Type 2 diabetes mellitus with other skin ulcer: Secondary | ICD-10-CM | POA: Diagnosis present

## 2016-07-19 DIAGNOSIS — J9622 Acute and chronic respiratory failure with hypercapnia: Secondary | ICD-10-CM | POA: Diagnosis not present

## 2016-07-19 DIAGNOSIS — B957 Other staphylococcus as the cause of diseases classified elsewhere: Secondary | ICD-10-CM

## 2016-07-19 DIAGNOSIS — F329 Major depressive disorder, single episode, unspecified: Secondary | ICD-10-CM | POA: Diagnosis present

## 2016-07-19 DIAGNOSIS — R498 Other voice and resonance disorders: Secondary | ICD-10-CM | POA: Diagnosis not present

## 2016-07-19 DIAGNOSIS — A411 Sepsis due to other specified staphylococcus: Principal | ICD-10-CM | POA: Diagnosis present

## 2016-07-19 DIAGNOSIS — Z89511 Acquired absence of right leg below knee: Secondary | ICD-10-CM

## 2016-07-19 DIAGNOSIS — Z7982 Long term (current) use of aspirin: Secondary | ICD-10-CM

## 2016-07-19 DIAGNOSIS — Z79899 Other long term (current) drug therapy: Secondary | ICD-10-CM

## 2016-07-19 DIAGNOSIS — E1140 Type 2 diabetes mellitus with diabetic neuropathy, unspecified: Secondary | ICD-10-CM | POA: Diagnosis present

## 2016-07-19 DIAGNOSIS — I878 Other specified disorders of veins: Secondary | ICD-10-CM | POA: Diagnosis present

## 2016-07-19 DIAGNOSIS — I110 Hypertensive heart disease with heart failure: Secondary | ICD-10-CM | POA: Diagnosis present

## 2016-07-19 DIAGNOSIS — Z4682 Encounter for fitting and adjustment of non-vascular catheter: Secondary | ICD-10-CM | POA: Diagnosis not present

## 2016-07-19 DIAGNOSIS — L97929 Non-pressure chronic ulcer of unspecified part of left lower leg with unspecified severity: Secondary | ICD-10-CM | POA: Diagnosis present

## 2016-07-19 DIAGNOSIS — R0602 Shortness of breath: Secondary | ICD-10-CM | POA: Diagnosis not present

## 2016-07-19 DIAGNOSIS — N390 Urinary tract infection, site not specified: Secondary | ICD-10-CM

## 2016-07-19 DIAGNOSIS — G8929 Other chronic pain: Secondary | ICD-10-CM | POA: Diagnosis present

## 2016-07-19 DIAGNOSIS — Z6841 Body Mass Index (BMI) 40.0 and over, adult: Secondary | ICD-10-CM

## 2016-07-19 DIAGNOSIS — I872 Venous insufficiency (chronic) (peripheral): Secondary | ICD-10-CM

## 2016-07-19 DIAGNOSIS — R451 Restlessness and agitation: Secondary | ICD-10-CM | POA: Diagnosis not present

## 2016-07-19 DIAGNOSIS — E662 Morbid (severe) obesity with alveolar hypoventilation: Secondary | ICD-10-CM | POA: Diagnosis present

## 2016-07-19 DIAGNOSIS — E1165 Type 2 diabetes mellitus with hyperglycemia: Secondary | ICD-10-CM | POA: Diagnosis present

## 2016-07-19 DIAGNOSIS — N289 Disorder of kidney and ureter, unspecified: Secondary | ICD-10-CM | POA: Diagnosis present

## 2016-07-19 DIAGNOSIS — E870 Hyperosmolality and hypernatremia: Secondary | ICD-10-CM | POA: Diagnosis present

## 2016-07-19 DIAGNOSIS — I248 Other forms of acute ischemic heart disease: Secondary | ICD-10-CM | POA: Diagnosis present

## 2016-07-19 DIAGNOSIS — R4781 Slurred speech: Secondary | ICD-10-CM | POA: Diagnosis not present

## 2016-07-19 DIAGNOSIS — R7881 Bacteremia: Secondary | ICD-10-CM

## 2016-07-19 DIAGNOSIS — R40243 Glasgow coma scale score 3-8, unspecified time: Secondary | ICD-10-CM | POA: Diagnosis present

## 2016-07-19 DIAGNOSIS — Z23 Encounter for immunization: Secondary | ICD-10-CM

## 2016-07-19 DIAGNOSIS — J9601 Acute respiratory failure with hypoxia: Secondary | ICD-10-CM

## 2016-07-19 DIAGNOSIS — Z978 Presence of other specified devices: Secondary | ICD-10-CM

## 2016-07-19 DIAGNOSIS — I509 Heart failure, unspecified: Secondary | ICD-10-CM | POA: Diagnosis not present
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
## 2016-07-19 NOTE — ED Triage Notes (Signed)
Pt arrives to A12 at this via [facility] stretcher.  Per EMS report pt's initial oxygen saturation was noted to be 63%.  Pt placed on a non-rebreather and pt's oxygen saturation increased to 99%.  Pt's lung sounds are noted to be diminished at this time.   Chief Complaint  Patient presents with  . Shortness of Breath   Past Medical History:  Diagnosis Date  . Chronic diastolic heart failure ([place]) 12/31/2012  . Chronic pain 12/31/2012  . Diabetes mellitus without complication ([place])    TYPE 2  . Essential hypertension, benign 12/31/2012  . GERD 12/31/2012  . Obstructive sleep apnea 07/05/2014  . Shortness of breath dyspnea   . 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 Unspecified vitamin D deficiency 12/31/2012  . Venous insufficiency (chronic) (peripheral)   . Venous stasis ulcers ([place])

## 2016-07-20 ENCOUNTER — Encounter (HOSPITAL_COMMUNITY): Payer: Self-pay | Admitting: *Deleted

## 2016-07-20 ENCOUNTER — Emergency Department (HOSPITAL_COMMUNITY): Payer: Medicare Other

## 2016-07-20 DIAGNOSIS — I872 Venous insufficiency (chronic) (peripheral): Secondary | ICD-10-CM | POA: Diagnosis not present

## 2016-07-20 DIAGNOSIS — K219 Gastro-esophageal reflux disease without esophagitis: Secondary | ICD-10-CM | POA: Diagnosis present

## 2016-07-20 DIAGNOSIS — E872 Acidosis: Secondary | ICD-10-CM | POA: Diagnosis present

## 2016-07-20 DIAGNOSIS — J9602 Acute respiratory failure with hypercapnia: Secondary | ICD-10-CM | POA: Diagnosis present

## 2016-07-20 DIAGNOSIS — Z89511 Acquired absence of right leg below knee: Secondary | ICD-10-CM | POA: Diagnosis not present

## 2016-07-20 DIAGNOSIS — R7881 Bacteremia: Secondary | ICD-10-CM | POA: Diagnosis not present

## 2016-07-20 DIAGNOSIS — J9690 Respiratory failure, unspecified, unspecified whether with hypoxia or hypercapnia: Secondary | ICD-10-CM | POA: Diagnosis not present

## 2016-07-20 DIAGNOSIS — L97229 Non-pressure chronic ulcer of left calf with unspecified severity: Secondary | ICD-10-CM | POA: Diagnosis not present

## 2016-07-20 DIAGNOSIS — J9621 Acute and chronic respiratory failure with hypoxia: Secondary | ICD-10-CM | POA: Diagnosis not present

## 2016-07-20 DIAGNOSIS — I5032 Chronic diastolic (congestive) heart failure: Secondary | ICD-10-CM | POA: Diagnosis not present

## 2016-07-20 DIAGNOSIS — E1165 Type 2 diabetes mellitus with hyperglycemia: Secondary | ICD-10-CM | POA: Diagnosis present

## 2016-07-20 DIAGNOSIS — G9341 Metabolic encephalopathy: Secondary | ICD-10-CM | POA: Diagnosis present

## 2016-07-20 DIAGNOSIS — J9610 Chronic respiratory failure, unspecified whether with hypoxia or hypercapnia: Secondary | ICD-10-CM | POA: Diagnosis not present

## 2016-07-20 DIAGNOSIS — L03116 Cellulitis of left lower limb: Secondary | ICD-10-CM | POA: Diagnosis not present

## 2016-07-20 DIAGNOSIS — R498 Other voice and resonance disorders: Secondary | ICD-10-CM | POA: Diagnosis not present

## 2016-07-20 DIAGNOSIS — R0902 Hypoxemia: Secondary | ICD-10-CM | POA: Diagnosis not present

## 2016-07-20 DIAGNOSIS — Z79899 Other long term (current) drug therapy: Secondary | ICD-10-CM | POA: Diagnosis not present

## 2016-07-20 DIAGNOSIS — L97929 Non-pressure chronic ulcer of unspecified part of left lower leg with unspecified severity: Secondary | ICD-10-CM | POA: Diagnosis present

## 2016-07-20 DIAGNOSIS — R0602 Shortness of breath: Secondary | ICD-10-CM | POA: Diagnosis not present

## 2016-07-20 DIAGNOSIS — G8929 Other chronic pain: Secondary | ICD-10-CM | POA: Diagnosis present

## 2016-07-20 DIAGNOSIS — J9601 Acute respiratory failure with hypoxia: Secondary | ICD-10-CM | POA: Diagnosis not present

## 2016-07-20 DIAGNOSIS — I5033 Acute on chronic diastolic (congestive) heart failure: Secondary | ICD-10-CM | POA: Diagnosis not present

## 2016-07-20 DIAGNOSIS — B957 Other staphylococcus as the cause of diseases classified elsewhere: Secondary | ICD-10-CM | POA: Diagnosis not present

## 2016-07-20 DIAGNOSIS — I110 Hypertensive heart disease with heart failure: Secondary | ICD-10-CM | POA: Diagnosis present

## 2016-07-20 DIAGNOSIS — J9620 Acute and chronic respiratory failure, unspecified whether with hypoxia or hypercapnia: Secondary | ICD-10-CM | POA: Diagnosis not present

## 2016-07-20 DIAGNOSIS — E1151 Type 2 diabetes mellitus with diabetic peripheral angiopathy without gangrene: Secondary | ICD-10-CM | POA: Diagnosis present

## 2016-07-20 DIAGNOSIS — R4781 Slurred speech: Secondary | ICD-10-CM | POA: Diagnosis not present

## 2016-07-20 DIAGNOSIS — R197 Diarrhea, unspecified: Secondary | ICD-10-CM | POA: Diagnosis not present

## 2016-07-20 DIAGNOSIS — E662 Morbid (severe) obesity with alveolar hypoventilation: Secondary | ICD-10-CM | POA: Diagnosis present

## 2016-07-20 DIAGNOSIS — E119 Type 2 diabetes mellitus without complications: Secondary | ICD-10-CM | POA: Diagnosis not present

## 2016-07-20 DIAGNOSIS — I878 Other specified disorders of veins: Secondary | ICD-10-CM | POA: Diagnosis present

## 2016-07-20 DIAGNOSIS — F329 Major depressive disorder, single episode, unspecified: Secondary | ICD-10-CM | POA: Diagnosis not present

## 2016-07-20 DIAGNOSIS — A419 Sepsis, unspecified organism: Secondary | ICD-10-CM | POA: Diagnosis not present

## 2016-07-20 DIAGNOSIS — R1312 Dysphagia, oropharyngeal phase: Secondary | ICD-10-CM | POA: Diagnosis not present

## 2016-07-20 DIAGNOSIS — R40243 Glasgow coma scale score 3-8, unspecified time: Secondary | ICD-10-CM | POA: Diagnosis present

## 2016-07-20 DIAGNOSIS — Z9981 Dependence on supplemental oxygen: Secondary | ICD-10-CM | POA: Diagnosis not present

## 2016-07-20 DIAGNOSIS — E870 Hyperosmolality and hypernatremia: Secondary | ICD-10-CM | POA: Diagnosis present

## 2016-07-20 DIAGNOSIS — N183 Chronic kidney disease, stage 3 (moderate): Secondary | ICD-10-CM | POA: Diagnosis not present

## 2016-07-20 DIAGNOSIS — I509 Heart failure, unspecified: Secondary | ICD-10-CM | POA: Diagnosis not present

## 2016-07-20 DIAGNOSIS — J9600 Acute respiratory failure, unspecified whether with hypoxia or hypercapnia: Secondary | ICD-10-CM | POA: Diagnosis not present

## 2016-07-20 DIAGNOSIS — I7389 Other specified peripheral vascular diseases: Secondary | ICD-10-CM | POA: Diagnosis not present

## 2016-07-20 DIAGNOSIS — I87312 Chronic venous hypertension (idiopathic) with ulcer of left lower extremity: Secondary | ICD-10-CM | POA: Diagnosis not present

## 2016-07-20 DIAGNOSIS — J9622 Acute and chronic respiratory failure with hypercapnia: Secondary | ICD-10-CM | POA: Diagnosis not present

## 2016-07-20 DIAGNOSIS — J80 Acute respiratory distress syndrome: Secondary | ICD-10-CM | POA: Diagnosis not present

## 2016-07-20 DIAGNOSIS — M6281 Muscle weakness (generalized): Secondary | ICD-10-CM | POA: Diagnosis not present

## 2016-07-20 DIAGNOSIS — E1140 Type 2 diabetes mellitus with diabetic neuropathy, unspecified: Secondary | ICD-10-CM | POA: Diagnosis present

## 2016-07-20 DIAGNOSIS — Z23 Encounter for immunization: Secondary | ICD-10-CM | POA: Diagnosis not present

## 2016-07-20 DIAGNOSIS — Z6841 Body Mass Index (BMI) 40.0 and over, adult: Secondary | ICD-10-CM | POA: Diagnosis not present

## 2016-07-20 DIAGNOSIS — A411 Sepsis due to other specified staphylococcus: Secondary | ICD-10-CM | POA: Diagnosis present

## 2016-07-20 DIAGNOSIS — Z4682 Encounter for fitting and adjustment of non-vascular catheter: Secondary | ICD-10-CM | POA: Diagnosis not present

## 2016-07-20 DIAGNOSIS — I248 Other forms of acute ischemic heart disease: Secondary | ICD-10-CM | POA: Diagnosis present

## 2016-07-20 DIAGNOSIS — E11649 Type 2 diabetes mellitus with hypoglycemia without coma: Secondary | ICD-10-CM | POA: Diagnosis present

## 2016-07-20 DIAGNOSIS — D631 Anemia in chronic kidney disease: Secondary | ICD-10-CM | POA: Diagnosis not present

## 2016-07-20 DIAGNOSIS — E871 Hypo-osmolality and hyponatremia: Secondary | ICD-10-CM | POA: Diagnosis not present

## 2016-07-20 DIAGNOSIS — R509 Fever, unspecified: Secondary | ICD-10-CM | POA: Diagnosis not present

## 2016-07-20 DIAGNOSIS — R0989 Other specified symptoms and signs involving the circulatory and respiratory systems: Secondary | ICD-10-CM | POA: Diagnosis not present

## 2016-07-20 LAB — I-STAT ARTERIAL BLOOD GAS, ED
ACID-BASE EXCESS: 28 mmol/L — AB (ref 0.0–2.0)
BICARBONATE: 54.8 mmol/L — AB (ref 20.0–28.0)
O2 SAT: 97 %
PO2 ART: 88 mmHg (ref 83.0–108.0)
Patient temperature: 37.3
TCO2: >50 mmol/L (ref 0–100)
pCO2 arterial: 63.5 mmHg — ABNORMAL HIGH (ref 32.0–48.0)
pH, Arterial: 7.545 — ABNORMAL HIGH (ref 7.350–7.450)

## 2016-07-20 LAB — CBC WITH DIFFERENTIAL/PLATELET
Basophils Absolute: 0 10*3/uL (ref 0.0–0.1)
Basophils Relative: 0 %
EOS ABS: 0.2 10*3/uL (ref 0.0–0.7)
EOS PCT: 2 %
HCT: 44.4 % (ref 36.0–46.0)
Hemoglobin: 11.9 g/dL — ABNORMAL LOW (ref 12.0–15.0)
LYMPHS ABS: 1.6 10*3/uL (ref 0.7–4.0)
LYMPHS PCT: 16 %
MCH: 26.3 pg (ref 26.0–34.0)
MCHC: 26.8 g/dL — AB (ref 30.0–36.0)
MCV: 98.2 fL (ref 78.0–100.0)
MONO ABS: 1.2 10*3/uL — AB (ref 0.1–1.0)
Monocytes Relative: 12 %
NEUTROS PCT: 70 %
Neutro Abs: 6.8 10*3/uL (ref 1.7–7.7)
PLATELETS: 193 10*3/uL (ref 150–400)
RBC: 4.52 MIL/uL (ref 3.87–5.11)
RDW: 18.2 % — ABNORMAL HIGH (ref 11.5–15.5)
WBC: 9.8 10*3/uL (ref 4.0–10.5)

## 2016-07-20 LAB — BLOOD GAS, ARTERIAL
ACID-BASE EXCESS: 23.1 mmol/L — AB (ref 0.0–2.0)
Bicarbonate: 50.6 mmol/L — ABNORMAL HIGH (ref 20.0–28.0)
Drawn by: 345601
FIO2: 0.6
LHR: 26 {breaths}/min
O2 SAT: 94.3 %
PATIENT TEMPERATURE: 98.9
PCO2 ART: 106 mmHg — AB (ref 32.0–48.0)
PEEP/CPAP: 5 cmH2O
PH ART: 7.3 — AB (ref 7.350–7.450)
VT: 350 mL
pO2, Arterial: 77.2 mmHg — ABNORMAL LOW (ref 83.0–108.0)

## 2016-07-20 LAB — BLOOD CULTURE ID PANEL (REFLEXED)
Acinetobacter baumannii: NOT DETECTED
CANDIDA GLABRATA: NOT DETECTED
CANDIDA KRUSEI: NOT DETECTED
CANDIDA PARAPSILOSIS: NOT DETECTED
CANDIDA TROPICALIS: NOT DETECTED
Candida albicans: NOT DETECTED
ENTEROBACTER CLOACAE COMPLEX: NOT DETECTED
ESCHERICHIA COLI: NOT DETECTED
Enterobacteriaceae species: NOT DETECTED
Enterococcus species: NOT DETECTED
Haemophilus influenzae: NOT DETECTED
KLEBSIELLA PNEUMONIAE: NOT DETECTED
Klebsiella oxytoca: NOT DETECTED
Listeria monocytogenes: NOT DETECTED
Methicillin resistance: NOT DETECTED
Neisseria meningitidis: NOT DETECTED
PROTEUS SPECIES: NOT DETECTED
Pseudomonas aeruginosa: NOT DETECTED
STAPHYLOCOCCUS SPECIES: DETECTED — AB
Serratia marcescens: NOT DETECTED
Staphylococcus aureus (BCID): NOT DETECTED
Streptococcus agalactiae: NOT DETECTED
Streptococcus pneumoniae: NOT DETECTED
Streptococcus pyogenes: NOT DETECTED
Streptococcus species: NOT DETECTED

## 2016-07-20 LAB — BLOOD GAS, VENOUS
Acid-Base Excess: 21.8 mmol/L — ABNORMAL HIGH (ref 0.0–2.0)
Bicarbonate: 50.9 mmol/L — ABNORMAL HIGH (ref 20.0–28.0)
DRAWN BY: 441261
FIO2: 60
LHR: 18 {breaths}/min
O2 Saturation: 78.2 %
PCO2 VEN: 84.5 mmHg — AB (ref 44.0–60.0)
PEEP: 5 cmH2O
PO2 VEN: 47.2 mmHg — AB (ref 32.0–45.0)
Patient temperature: 37
VT: 350 mL
pH, Ven: 7.398 (ref 7.250–7.430)

## 2016-07-20 LAB — TRIGLYCERIDES: Triglycerides: 149 mg/dL (ref <150)

## 2016-07-20 LAB — URINE MICROSCOPIC-ADD ON

## 2016-07-20 LAB — AMMONIA: Ammonia: 32 umol/L (ref 9–35)

## 2016-07-20 LAB — GLUCOSE, CAPILLARY
GLUCOSE-CAPILLARY: 159 mg/dL — AB (ref 65–99)
GLUCOSE-CAPILLARY: 154 mg/dL — AB (ref 65–99)
GLUCOSE-CAPILLARY: 161 mg/dL — AB (ref 65–99)
Glucose-Capillary: 151 mg/dL — ABNORMAL HIGH (ref 65–99)

## 2016-07-20 LAB — URINALYSIS, ROUTINE W REFLEX MICROSCOPIC
Bilirubin Urine: NEGATIVE
Glucose, UA: NEGATIVE mg/dL
Ketones, ur: NEGATIVE mg/dL
NITRITE: NEGATIVE
PROTEIN: 100 mg/dL — AB
Specific Gravity, Urine: 1.015 (ref 1.005–1.030)
pH: 6 (ref 5.0–8.0)

## 2016-07-20 LAB — RAPID URINE DRUG SCREEN, HOSP PERFORMED
AMPHETAMINES: NOT DETECTED
BENZODIAZEPINES: NOT DETECTED
Barbiturates: NOT DETECTED
Cocaine: NOT DETECTED
OPIATES: NOT DETECTED
Tetrahydrocannabinol: NOT DETECTED

## 2016-07-20 LAB — COMPREHENSIVE METABOLIC PANEL
ALT: 16 U/L (ref 14–54)
ANION GAP: 7 (ref 5–15)
AST: 19 U/L (ref 15–41)
Albumin: 2.9 g/dL — ABNORMAL LOW (ref 3.5–5.0)
Alkaline Phosphatase: 71 U/L (ref 38–126)
BUN: 48 mg/dL — ABNORMAL HIGH (ref 6–20)
CHLORIDE: 88 mmol/L — AB (ref 101–111)
CO2: 45 mmol/L — AB (ref 22–32)
Calcium: 8.8 mg/dL — ABNORMAL LOW (ref 8.9–10.3)
Creatinine, Ser: 1.69 mg/dL — ABNORMAL HIGH (ref 0.44–1.00)
GFR calc non Af Amer: 29 mL/min — ABNORMAL LOW (ref >60)
GFR, EST AFRICAN AMERICAN: 34 mL/min — AB (ref >60)
Glucose, Bld: 52 mg/dL — ABNORMAL LOW (ref 65–99)
Potassium: 3.9 mmol/L (ref 3.5–5.1)
SODIUM: 140 mmol/L (ref 135–145)
Total Bilirubin: 0.6 mg/dL (ref 0.3–1.2)
Total Protein: 8.1 g/dL (ref 6.5–8.1)

## 2016-07-20 LAB — CBC
HCT: 40.8 % (ref 36.0–46.0)
HEMOGLOBIN: 10.6 g/dL — AB (ref 12.0–15.0)
MCH: 25.7 pg — AB (ref 26.0–34.0)
MCHC: 26 g/dL — ABNORMAL LOW (ref 30.0–36.0)
MCV: 99 fL (ref 78.0–100.0)
PLATELETS: 184 10*3/uL (ref 150–400)
RBC: 4.12 MIL/uL (ref 3.87–5.11)
RDW: 18.1 % — ABNORMAL HIGH (ref 11.5–15.5)
WBC: 7 10*3/uL (ref 4.0–10.5)

## 2016-07-20 LAB — I-STAT CG4 LACTIC ACID, ED: LACTIC ACID, VENOUS: 1.16 mmol/L (ref 0.5–1.9)

## 2016-07-20 LAB — BASIC METABOLIC PANEL
ANION GAP: 6 (ref 5–15)
BUN: 45 mg/dL — AB (ref 6–20)
CHLORIDE: 94 mmol/L — AB (ref 101–111)
CO2: 45 mmol/L — ABNORMAL HIGH (ref 22–32)
Calcium: 8.1 mg/dL — ABNORMAL LOW (ref 8.9–10.3)
Creatinine, Ser: 1.66 mg/dL — ABNORMAL HIGH (ref 0.44–1.00)
GFR, EST AFRICAN AMERICAN: 35 mL/min — AB (ref >60)
GFR, EST NON AFRICAN AMERICAN: 30 mL/min — AB (ref >60)
Glucose, Bld: 122 mg/dL — ABNORMAL HIGH (ref 65–99)
POTASSIUM: 4.1 mmol/L (ref 3.5–5.1)
SODIUM: 145 mmol/L (ref 135–145)

## 2016-07-20 LAB — MRSA PCR SCREENING: MRSA BY PCR: POSITIVE — AB

## 2016-07-20 LAB — CBG MONITORING, ED
GLUCOSE-CAPILLARY: 159 mg/dL — AB (ref 65–99)
Glucose-Capillary: 50 mg/dL — ABNORMAL LOW (ref 65–99)
Glucose-Capillary: 133 mg/dL — ABNORMAL HIGH (ref 65–99)

## 2016-07-20 LAB — TROPONIN I
Troponin I: 0.05 ng/mL (ref <0.03)
Troponin I: 0.05 ng/mL (ref <0.03)

## 2016-07-20 LAB — PROCALCITONIN: Procalcitonin: <0.1 ng/mL

## 2016-07-20 LAB — I-STAT TROPONIN, ED: TROPONIN I, POC: 0 ng/mL (ref 0.00–0.08)

## 2016-07-20 LAB — PHOSPHORUS: PHOSPHORUS: 3.5 mg/dL (ref 2.5–4.6)

## 2016-07-20 LAB — BRAIN NATRIURETIC PEPTIDE: B Natriuretic Peptide: 51.1 pg/mL (ref 0.0–100.0)

## 2016-07-20 LAB — MAGNESIUM: MAGNESIUM: 2.4 mg/dL (ref 1.7–2.4)

## 2016-07-20 MED ORDER — CHLORHEXIDINE GLUCONATE 0.12% ORAL RINSE (MEDLINE KIT)
15.0000 mL | Freq: Two times a day (BID) | OROMUCOSAL | Status: DC
  Administered 2016-07-20 – 2016-07-28 (×15): 15 mL via OROMUCOSAL

## 2016-07-20 MED ORDER — VITAMINS A & D EX OINT
TOPICAL_OINTMENT | CUTANEOUS | Status: AC
  Administered 2016-07-20: 5
  Filled 2016-07-20: qty 5

## 2016-07-20 MED ORDER — SUCCINYLCHOLINE CHLORIDE 20 MG/ML IJ SOLN
INTRAMUSCULAR | Status: AC | PRN
  Administered 2016-07-20: 150 mg via INTRAVENOUS

## 2016-07-20 MED ORDER — FAMOTIDINE IN NACL 20-0.9 MG/50ML-% IV SOLN
20.0000 mg | Freq: Every day | INTRAVENOUS | Status: DC
  Administered 2016-07-20 – 2016-07-21 (×2): 20 mg via INTRAVENOUS
  Filled 2016-07-20 (×2): qty 50

## 2016-07-20 MED ORDER — ETOMIDATE 2 MG/ML IV SOLN: 30.0000 mg | Freq: Once | INTRAVENOUS | Status: DC

## 2016-07-20 MED ORDER — FENTANYL BOLUS VIA INFUSION
25.0000 ug | INTRAVENOUS | Status: DC | PRN
  Filled 2016-07-20: qty 25

## 2016-07-20 MED ORDER — FENTANYL CITRATE (PF) 100 MCG/2ML IJ SOLN
25.0000 ug | INTRAMUSCULAR | Status: DC | PRN
  Administered 2016-07-22: 50 ug via INTRAVENOUS
  Filled 2016-07-20: qty 2

## 2016-07-20 MED ORDER — PROPOFOL 1000 MG/100ML IV EMUL
0.0000 ug/kg/min | INTRAVENOUS | Status: DC
  Administered 2016-07-20: 15 ug/kg/min via INTRAVENOUS
  Administered 2016-07-20: 11 ug/kg/min via INTRAVENOUS
  Administered 2016-07-21: 10 ug/kg/min via INTRAVENOUS
  Administered 2016-07-21: 13 ug/kg/min via INTRAVENOUS
  Administered 2016-07-21: 10 ug/kg/min via INTRAVENOUS
  Administered 2016-07-21: 6.3 ug/kg/min via INTRAVENOUS
  Administered 2016-07-22 (×2): 20 ug/kg/min via INTRAVENOUS
  Administered 2016-07-22: 8 ug/kg/min via INTRAVENOUS
  Administered 2016-07-23: 20 ug/kg/min via INTRAVENOUS
  Filled 2016-07-20 (×10): qty 100

## 2016-07-20 MED ORDER — DEXTROSE 50 % IV SOLN
1.0000 | Freq: Once | INTRAVENOUS | Status: AC
  Administered 2016-07-20: 50 mL via INTRAVENOUS
  Filled 2016-07-20: qty 50

## 2016-07-20 MED ORDER — PRO-STAT SUGAR FREE PO LIQD
30.0000 mL | Freq: Two times a day (BID) | ORAL | Status: DC
  Administered 2016-07-20 – 2016-07-21 (×3): 30 mL
  Filled 2016-07-20 (×3): qty 30

## 2016-07-20 MED ORDER — DEXTROSE 5 % IV SOLN
1.0000 g | Freq: Once | INTRAVENOUS | Status: AC
  Administered 2016-07-20: 1 g via INTRAVENOUS
  Filled 2016-07-20: qty 10

## 2016-07-20 MED ORDER — DEXTROSE-NACL 5-0.45 % IV SOLN
INTRAVENOUS | Status: DC
  Administered 2016-07-20 – 2016-07-21 (×2): via INTRAVENOUS

## 2016-07-20 MED ORDER — MUPIROCIN 2 % EX OINT
1.0000 "application " | TOPICAL_OINTMENT | Freq: Two times a day (BID) | CUTANEOUS | Status: AC
  Administered 2016-07-20 – 2016-07-24 (×10): 1 via NASAL
  Filled 2016-07-20 (×2): qty 22

## 2016-07-20 MED ORDER — FENTANYL 2500MCG IN NS 250ML (10MCG/ML) PREMIX INFUSION
10.0000 ug/h | INTRAVENOUS | Status: DC
  Administered 2016-07-20: 25 ug/h via INTRAVENOUS
  Filled 2016-07-20: qty 250

## 2016-07-20 MED ORDER — FENTANYL CITRATE (PF) 100 MCG/2ML IJ SOLN: 50.0000 ug | Freq: Once | INTRAMUSCULAR | Status: DC

## 2016-07-20 MED ORDER — SODIUM CHLORIDE 0.9% FLUSH
10.0000 mL | INTRAVENOUS | Status: DC | PRN
  Administered 2016-07-29 – 2016-07-30 (×2): 30 mL
  Filled 2016-07-20 (×2): qty 40

## 2016-07-20 MED ORDER — CHLORHEXIDINE GLUCONATE CLOTH 2 % EX PADS
6.0000 | MEDICATED_PAD | Freq: Every day | CUTANEOUS | Status: AC
  Administered 2016-07-20 – 2016-07-24 (×5): 6 via TOPICAL

## 2016-07-20 MED ORDER — ETOMIDATE 2 MG/ML IV SOLN
INTRAVENOUS | Status: AC | PRN
  Administered 2016-07-20: 30 mg via INTRAVENOUS

## 2016-07-20 MED ORDER — VITAL HIGH PROTEIN PO LIQD
1000.0000 mL | ORAL | Status: DC
  Administered 2016-07-20: 1000 mL
  Filled 2016-07-20 (×2): qty 1000

## 2016-07-20 MED ORDER — FUROSEMIDE 10 MG/ML IJ SOLN
40.0000 mg | Freq: Once | INTRAMUSCULAR | Status: AC
  Administered 2016-07-20: 40 mg via INTRAVENOUS
  Filled 2016-07-20: qty 4

## 2016-07-20 MED ORDER — SODIUM CHLORIDE 0.9 % IV SOLN: 250.0000 mL | INTRAVENOUS | Status: DC | PRN

## 2016-07-20 MED ORDER — SODIUM CHLORIDE 0.9 % IV SOLN: 25.0000 ug/h | INTRAVENOUS | Status: DC

## 2016-07-20 MED ORDER — INSULIN ASPART 100 UNIT/ML ~~LOC~~ SOLN
1.0000 [IU] | SUBCUTANEOUS | Status: DC
  Administered 2016-07-20 – 2016-07-22 (×12): 2 [IU] via SUBCUTANEOUS
  Administered 2016-07-22 (×2): 1 [IU] via SUBCUTANEOUS
  Administered 2016-07-22 – 2016-07-23 (×5): 2 [IU] via SUBCUTANEOUS

## 2016-07-20 MED ORDER — PROPOFOL 1000 MG/100ML IV EMUL
5.0000 ug/kg/min | Freq: Once | INTRAVENOUS | Status: AC
  Administered 2016-07-20: 5 ug/kg/min via INTRAVENOUS
  Filled 2016-07-20 (×2): qty 100

## 2016-07-20 MED ORDER — SUCCINYLCHOLINE CHLORIDE 20 MG/ML IJ SOLN
150.0000 mg | Freq: Once | INTRAMUSCULAR | Status: DC
  Filled 2016-07-20: qty 7.5

## 2016-07-20 MED ORDER — DEXTROSE 50 % IV SOLN
50.0000 mL | Freq: Once | INTRAVENOUS | Status: AC
  Administered 2016-07-20: 50 mL via INTRAVENOUS
  Filled 2016-07-20: qty 50

## 2016-07-20 MED ORDER — ORAL CARE MOUTH RINSE
15.0000 mL | Freq: Four times a day (QID) | OROMUCOSAL | Status: DC
  Administered 2016-07-20 – 2016-07-28 (×31): 15 mL via OROMUCOSAL

## 2016-07-20 MED ORDER — SODIUM CHLORIDE 0.9 % IV SOLN: 10.0000 ug/h | INTRAVENOUS | Status: DC

## 2016-07-20 MED ORDER — DEXTROSE 5 % IV SOLN
1.0000 g | Freq: Two times a day (BID) | INTRAVENOUS | Status: DC
  Administered 2016-07-20 (×2): 1 g via INTRAVENOUS
  Filled 2016-07-20 (×3): qty 1

## 2016-07-20 MED ORDER — DEXTROSE 10 % IV SOLN
INTRAVENOUS | Status: DC
  Administered 2016-07-20: 04:00:00 via INTRAVENOUS

## 2016-07-20 MED ORDER — SODIUM CHLORIDE 0.9% FLUSH
10.0000 mL | Freq: Two times a day (BID) | INTRAVENOUS | Status: DC
  Administered 2016-07-20 – 2016-07-23 (×7): 10 mL
  Administered 2016-07-24: 20 mL
  Administered 2016-07-24 – 2016-08-01 (×11): 10 mL

## 2016-07-20 MED ORDER — HEPARIN SODIUM (PORCINE) 5000 UNIT/ML IJ SOLN
5000.0000 [IU] | Freq: Three times a day (TID) | INTRAMUSCULAR | Status: DC
  Administered 2016-07-20 – 2016-08-01 (×37): 5000 [IU] via SUBCUTANEOUS
  Filled 2016-07-20 (×36): qty 1

## 2016-07-20 NOTE — H&P (Signed)
PULMONARY / CRITICAL CARE MEDICINE   Name: Heather Zimmerman MRN: [id number] DOB: 01-Feb-1946    ADMISSION DATE:  07/19/2016 CONSULTATION DATE:  07/20/2016  REFERRING MD:  Dr. Joseph Berkshire  CHIEF COMPLAINT:  AMS, SOB   HISTORY OF PRESENT ILLNESS:   Heather Zimmerman is a 70-y/o female with PMH significant for dCHF, T2DM, sleep apnea not on regular cpap, obesity hypoventilation syndrome on O2, PVD with chronic stasis ulcers and R BKA who presents from SNF with acute respiratory failure and lethargy. Duration of symptoms unknown. O2 sats 63% by EMS report but improved to 99% on non-rebreather. Per ED, initial GCS was 8, and patient was tachypneic with accessory muscle use. Per ED nurse, she initially was oriented at least to person and month but speech quickly began not to make sense. She was placed on bipap but continued to have increased WOB and decreased alertness so was intubated in the ED. Glucose was low at 52, so 1 amp of D50 given. CXR with evidence of pulmonary edema. 40 mg IV lasix given. No elevated LA or leukocytosis to suggest infection. ABG showed severe compensated respiratory acidosis with pH 7.3, pCO2 106, pO2 77.2, bicarb 50.6 (appears to have been obtained after intubation). BNP 51.1, slightly elevated from previous (39.7). SCr at baseline.   Of note, patient's insulin regimen had been increased significantly over the summer, from lantus 26 U daily with 5-10 U novolog with meals to lantus 42 U nightly, 26 U novolog with meals. Had ED visit 07/07/16 for episode of AMS and hypoglycemia to 46 that improved after administration of D50. She has had recent hospitalizations during which IV diuresis has improved respiratory status. Home meds include 20 mg torsemide.  PAST MEDICAL HISTORY :  She  has a past medical history of Chronic diastolic heart failure ([place]) (12/31/2012); Chronic pain (12/31/2012); Diabetes mellitus without complication ([place]); Essential hypertension, benign (12/31/2012);  GERD (12/31/2012); Obstructive sleep apnea (07/05/2014); Shortness of breath dyspnea; Type II or unspecified type diabetes mellitus with peripheral circulatory disorders, not stated as uncontrolled(250.70) (12/31/2012); Unspecified vitamin D deficiency (12/31/2012); Venous insufficiency (chronic) (peripheral); and Venous stasis ulcers ([place]).  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  Allergies  Allergen Reactions  . Ace Inhibitors     unknown    No current facility-administered medications on file prior to encounter.    Current Outpatient Prescriptions on File Prior to Encounter  Medication Sig  . acetaminophen (TYLENOL) 650 MG CR tablet Take 650 mg by mouth every 6 (six) hours as needed for pain.  . Amino Acids-Protein Hydrolys (FEEDING SUPPLEMENT, PRO-STAT 64,) LIQD Take 30 mLs by mouth 2 (two) times daily.  Marland Kitchen antiseptic oral rinse (BIOTENE) LIQD 15 mLs by Mouth Rinse route as needed for dry mouth.  Marland Kitchen aspirin 81 MG tablet Take 81 mg by mouth daily.  . camphor-menthol (SARNA) lotion Apply 1 application topically every 8 (eight) hours as needed for itching.  . cholecalciferol (VITAMIN D) 1000 UNITS tablet Take 2,000 Units by mouth daily.  . DULoxetine (CYMBALTA) 20 MG capsule Take 40 mg by mouth daily.   . Eyelid Cleansers (OCUSOFT EYELID CLEANSING) PADS Apply topically daily.   . fluticasone (FLONASE) 50 MCG/ACT nasal spray Place 2 sprays into both nostrils at bedtime.  . Fluticasone-Salmeterol (ADVAIR) 250-50 MCG/DOSE AEPB Inhale 1 puff into the lungs every 12 (twelve) hours. Reported on 04/25/2016  . gabapentin (NEURONTIN) 600 MG tablet  Take 600 mg by mouth 2 (two) times daily.  Marland Kitchen glucagon (GLUCAGON EMERGENCY) 1 MG injection Inject 1 mg into the vein once as needed (blood sugar).  . insulin aspart (NOVOLOG FLEXPEN) 100 UNIT/ML FlexPen 26 Units 3 (three) times daily with meals. Inject as per  sliding scale: if  0-149=0 ; Inject 7 units SQ for CBG > or = 150 150-250 = 7 units, subcutaneously before meals related to DM.   Marland Kitchen Insulin Glargine (LANTUS SOLOSTAR) 100 UNIT/ML Solostar Pen Inject 42 Units into the skin daily at 10 pm.   . ipratropium-albuterol (DUONEB) 0.5-2.5 (3) MG/3ML SOLN Take 3 mLs by nebulization every 6 (six) hours as needed (shortness of breath).  . magnesium hydroxide (MILK OF MAGNESIA) 400 MG/5ML suspension Take 30 mLs by mouth daily as needed for mild constipation.  . magnesium oxide (MAG-OX) 400 MG tablet Take 400 mg by mouth 3 (three) times daily.   Marland Kitchen torsemide (DEMADEX) 20 MG tablet Take 20 mg by mouth 2 (two) times daily.  . vitamin C (ASCORBIC ACID) 500 MG tablet Take 500 mg by mouth 2 (two) times daily.  . Zinc Sulfate 110 MG TABS Take 2 tablets by mouth daily.  . cetirizine (ZYRTEC) 10 MG tablet Take 10 mg by mouth as needed for allergies.    FAMILY HISTORY:  Her indicated that the status of her other is unknown.    SOCIAL HISTORY: She  reports that she has never smoked. She has never used smokeless tobacco. She reports that she does not drink alcohol or use drugs.  REVIEW OF SYSTEMS:   Unable to obtain due to sedation.    SUBJECTIVE:  Patient waking up to exam, about to receive fentanyl for sedation with intubation.   VITAL SIGNS: BP 114/85   Pulse 78   Temp 98.9 F (37.2 C) (Oral)   Resp 23   Ht 4\' 10"  (1.473 m)   Wt (!) 147 kg (324 lb)   SpO2 100%   BMI 67.72 kg/m   HEMODYNAMICS:    VENTILATOR SETTINGS: Vent Mode: PRVC FiO2 (%):  [60 %] 60 % Set Rate:  [24 bmp] 24 bmp Vt Set:  [350 mL] 350 mL PEEP:  [5 cmH20] 5 cmH20 Plateau Pressure:  [27 cmH20] 27 cmH20  INTAKE / OUTPUT: No intake/output data recorded.  PHYSICAL EXAMINATION: General:  Morbidly obese female, intubated Neuro:  Awakening to exam. In process of being sedated.  HEENT: ETT in place.  Cardiovascular: RRR, S1, S2, no m/r/g Lungs: Coarse breath sounds  throughout.  Abdomen: Obese, soft, +BS.  Musculoskeletal: R BKA.  Skin: Open weeping ulcers of L shin. Moist skin of pannus but no skin breakdown or discoloration.   LABS:  BMET  Recent Labs Lab 07/19/16 2320  NA 140  K 3.9  CL 88*  CO2 45*  BUN 48*  CREATININE 1.69*  GLUCOSE 52*    Electrolytes  Recent Labs Lab 07/19/16 2320  CALCIUM 8.8*    CBC  Recent Labs Lab 07/19/16 2320  WBC 9.8  HGB 11.9*  HCT 44.4  PLT 193    Coag's No results for input(s): APTT, INR in the last 168 hours.  Sepsis Markers  Recent Labs Lab 07/20/16 0006  LATICACIDVEN 1.16    ABG No results for input(s): PHART, PCO2ART, PO2ART in the last 168 hours.  Liver Enzymes  Recent Labs Lab 07/19/16 2320  AST 19  ALT 16  ALKPHOS 71  BILITOT 0.6  ALBUMIN 2.9*    Cardiac Enzymes No  results for input(s): TROPONINI, PROBNP in the last 168 hours.  Glucose No results for input(s): GLUCAP in the last 168 hours.  Imaging Dg Chest Port 1 View  Result Date: 07/20/2016 CLINICAL DATA:  Endotracheal tube placement EXAM: PORTABLE CHEST 1 VIEW COMPARISON:  07/19/2016 FINDINGS: Shallow inspiration. Cardiac enlargement without vascular congestion. Bilateral hilar prominence may represent infiltration or edema but hilar lymphadenopathy is not excluded. Follow-up with upright PA and lateral views of the chest when patient is able would be useful. No pleural effusions. No pneumothorax. Interval placement of endotracheal tube with tip measuring 2.6 cm above the carina. An enteric tube was placed with tip in the left upper quadrant consistent with location in the body of the stomach. IMPRESSION: Shallow inspiration. Cardiac enlargement. Infiltration or adenopathy demonstrated in the hilar regions. Appliances appear in satisfactory location. Electronically Signed   By: Lucienne Capers M.D.   On: 07/20/2016 02:17   Dg Chest Port 1 View  Result Date: 07/20/2016 CLINICAL DATA:  Acute onset of  shortness of breath. Initial encounter. EXAM: PORTABLE CHEST 1 VIEW COMPARISON:  Chest radiograph performed 07/07/2016 FINDINGS: The lungs are hypoexpanded. Fluffy bilateral central airspace opacities raise concern for pulmonary edema, though pneumonia could have a similar appearance. No pleural effusion or pneumothorax is seen. The cardiomediastinal silhouette is borderline enlarged. No acute osseous abnormalities are seen. IMPRESSION: Lungs hypoexpanded. Fluffy bilateral central airspace opacification raises concern for pulmonary edema, though pneumonia could have a similar appearance. This is worsened from the prior study. Borderline cardiomegaly. Electronically Signed   By: Garald Balding M.D.   On: 07/20/2016 00:44     STUDIES:  DG Chest 10/6 > Hypoexpanded lungs. Fluffy central airspace opacification suggestive of pulmonary edema. CT head 9/24 > No acute changes, intracranial mass or hemorrhage.   ECHO 12/21/15 > LVEF 60-65% with LVH and dilation of LA. PA pressure 29.   CULTURES: Blood cx x2 10/6 >  Urine cx 10/6 >   ANTIBIOTICS: Ceftriaxone x 1 10/7 > 10/7  SIGNIFICANT EVENTS: 10/6 - Admitted with acute respiratory failure.   LINES/TUBES: ETT 10/6 > PIVs x 2 10/6 > Foley cath 10/6 >  DISCUSSION: 70-y/o female with dCHF, obesity hypoventilation syndrome, T2DM, and PVD who presents with acute on chronic respiratory failure, requiring intubation for decreased alertness and increased WOB. CO2 45, increased from baseline ~ 35, suggesting worsening chronic CO2 retention. Not adherent to nightly bipap per available records. CXR consistent with pulmonary edema. Hypoglycemia likely also contributing to decreased level of alertness. Do not suspect infectious cause at this time.   ASSESSMENT / PLAN:  PULMONARY A: Acute on chronic respiratory failure Obesity hypoventilation syndrome Pulmonary edema on CXR P:   Full vent support Wean as able 10/7 Fentanyl and propofol for  sedation Repeat CXR 10/7 IV lasix Repeat ABG in am  CARDIOVASCULAR A:  dCHF, likely with exacerbation PVD P:  Repeat ECHO Obtain EKG IV lasix  RENAL A:   Impaired renal function, at baseline (SCr ~1.7) P:   Monitor with daily BMPs Size and poor renal function may contribute to poor clearance of [place] insulin  GASTROINTESTINAL A:   Intubated P:   Pepcid prophylaxis  HEMATOLOGIC A:   No leukocytosis Mild anemia, likely dilutional  P:  Monitor with daily CBCs  INFECTIOUS A:   No LA, leukocytosis Venous stasis ulcers L shin P:   Follow blood cx Follow urine cx Obtain Wound Consult, has been on unna boots  ENDOCRINE A:   T2DM, Poorly controlled  with hgb A1c of 10.1 Hypoglycemia, resolved (2nd reported episode); brittle? P:   SSI with dextrose per protocol  NEUROLOGIC A:   Acute encephalopathy in setting of hypercapnia, hypoglycemia  P:   Daily sedation interruption Correct hypoglycermia per protocol Wean off ventilator and sedation as able  RASS goal: 0 currently sedated  FAMILY  - Updates: No family at bedside 10/7  - Inter-disciplinary family meet or Palliative Care meeting due by: 10/12  Olene Floss, MD [place], PGY-2    _______________  I attest that I saw the patient and supervised the above resident in the care of this patient. I have reviewed and edited the documentation   Luz Brazen, MD [facility] [place] Pager: [phone]  07/20/2016, 2:59 AM   CRITICAL CARE Performed by: Luz Brazen   Total critical care time: 45 minutes  Critical care time was exclusive of separately billable procedures and treating other patients.  Critical care was necessary to treat or prevent imminent or life-threatening deterioration.  Critical care was time spent personally by me on the following activities: development of treatment plan with patient and/or surrogate as well as  nursing, discussions with consultants, evaluation of patient's response to treatment, examination of patient, obtaining history from patient or surrogate, ordering and performing treatments and interventions, ordering and review of laboratory studies, ordering and review of radiographic studies, pulse oximetry and re-evaluation of patient's condition.

## 2016-07-20 NOTE — Progress Notes (Signed)
Brief Nutrition Note  Consult received for enteral/tube feeding initiation and management.  Adult Enteral Nutrition Protocol initiated. Full assessment to follow.  Admitting Dx: Hypoglycemia [E16.2] Acute on chronic diastolic congestive heart failure (HCC) [I50.33] Acute on chronic respiratory failure with hypercapnia (HCC) [J96.22] Urinary tract infection without hematuria, site unspecified [N39.0]  Body mass index is 58.11 kg/m. Pt meets criteria for morbidly obese based on current BMI.  Labs:   Recent Labs Lab 07/19/16 2320 07/20/16 0310  NA 140 145  K 3.9 4.1  CL 88* 94*  CO2 45* 45*  BUN 48* 45*  CREATININE 1.69* 1.66*  CALCIUM 8.8* 8.1*  MG  --  2.4  PHOS  --  3.5  GLUCOSE 52* 122*    Burtis Junes RD, LDN, CNSC Clinical Nutrition Pager: [phone] 07/20/2016 10:20 AM

## 2016-07-20 NOTE — Progress Notes (Signed)
eLink Physician-Brief Progress Note Patient Name: Heather Zimmerman DOB: April 07, 1946 MRN: [id number]   Date of Service  07/20/2016  HPI/Events of Note  Ambulance transport on seeing for transition to [facility] intensive care unit. Hypoglycemia resolved on dextrose infusion. Currently with alkalosis combined respiratory and metabolic.   eICU Interventions  1. Decreased respiratory rate to 18 (from 26) 2. ABG at 7:30 AM      Intervention Category Major Interventions: Acid-Base disturbance - evaluation and management;Respiratory failure - evaluation and management  Heather Zimmerman 07/20/2016, 6:19 AM

## 2016-07-20 NOTE — Progress Notes (Signed)
RT assisted ED physician with intubation. ETCO2 positive color change. Bilateral breath sounds present. ABG and CXR pending.

## 2016-07-20 NOTE — Progress Notes (Signed)
Fentanyl discontinued, 140 cc drip wasted down sink witnessed by Lacinda Axon RN and Garment/textile technologist.

## 2016-07-20 NOTE — ED Notes (Signed)
Pt's left leg dressing removed at this time per Dr. Betsey Holiday.  Multiple wounds noted to the anterior aspect of pt's shin.  Dr. Betsey Holiday present at the bedside.

## 2016-07-20 NOTE — Progress Notes (Signed)
CRITICAL VALUE ALERT  Critical value received: Troponin 0.05 Date of notification:  07/20/2016  Time of notification:  1100  Critical value read back: Yes  Nurse who received alert:  Carylon Perches RN  MD notified (1st page):  Ramaswamey  Time of first page: 1100  MD notified (2nd page): Ramaswamey   Time of second page: 1115  Responding MD:  Onnie Graham  Time MD responded:  1118

## 2016-07-20 NOTE — ED Notes (Signed)
16 Fr orogastric tube inserted at this time.  + placement via auscultation and aspiration of gastric contents.

## 2016-07-20 NOTE — ED Notes (Signed)
Pt transported to [facility] at this time via [facility] transport.  Pt remains intubated with a 7 Fr ET tube that is noted to be 24 cm at the lip.  Pt's fentanyl infusion continues to infuse at 150 mcg/hr via pump at this time.  Pt continues to receive D10 at this time via pump at 100 mL/hr.  Continue pt's indwelling foley catheter, cardiac monitor, continuous pulse oximetry, and non-invasive BP monitor.   Chief Complaint  Patient presents with  . Shortness of Breath   Past Medical History:  Diagnosis Date  . Chronic diastolic heart failure ([place]) 12/31/2012  . Chronic pain 12/31/2012  . Diabetes mellitus without complication ([place])    TYPE 2  . Essential hypertension, benign 12/31/2012  . GERD 12/31/2012  . Obstructive sleep apnea 07/05/2014  . Shortness of breath dyspnea   . 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 Unspecified vitamin D deficiency 12/31/2012  . Venous insufficiency (chronic) (peripheral)   . Venous stasis ulcers ([place])

## 2016-07-20 NOTE — Progress Notes (Signed)
eLink Physician-Brief Progress Note Patient Name: Heather Zimmerman. Jeff DOB: 1946-03-15 MRN: [id number]   Date of Service  07/20/2016  HPI/Events of Note  Patient with persistent hypoglycemia after 1 ampule of dextrose.   eICU Interventions  1. Administering second amp of D50 2. Continuing Accu-Cheks every 4 hours with notification parameters 3. Initiating dextrose 10 infusion at 100 mL per hour      Intervention Category Intermediate Interventions: Other:  Tera Partridge 07/20/2016, 3:49 AM

## 2016-07-20 NOTE — Progress Notes (Signed)
Attempt to reach patient's brother for verbal consent for PICC line placement and to update on condition. Attempt unsuccessful no option to leave a voicemail.

## 2016-07-20 NOTE — Progress Notes (Signed)
PHARMACY - PHYSICIAN COMMUNICATION CRITICAL VALUE ALERT - BLOOD CULTURE IDENTIFICATION (BCID)  Results for orders placed or performed during the hospital encounter of 07/19/16  Blood Culture ID Panel (Reflexed) (Collected: 07/19/2016 11:18 PM)  Result Value Ref Range   Enterococcus species NOT DETECTED NOT DETECTED   Listeria monocytogenes NOT DETECTED NOT DETECTED   Staphylococcus species DETECTED (A) NOT DETECTED   Staphylococcus aureus NOT DETECTED NOT DETECTED   Methicillin resistance NOT DETECTED NOT DETECTED   Streptococcus species NOT DETECTED NOT DETECTED   Streptococcus agalactiae NOT DETECTED NOT DETECTED   Streptococcus pneumoniae NOT DETECTED NOT DETECTED   Streptococcus pyogenes NOT DETECTED NOT DETECTED   Acinetobacter baumannii NOT DETECTED NOT DETECTED   Enterobacteriaceae species NOT DETECTED NOT DETECTED   Enterobacter cloacae complex NOT DETECTED NOT DETECTED   Escherichia coli NOT DETECTED NOT DETECTED   Klebsiella oxytoca NOT DETECTED NOT DETECTED   Klebsiella pneumoniae NOT DETECTED NOT DETECTED   Proteus species NOT DETECTED NOT DETECTED   Serratia marcescens NOT DETECTED NOT DETECTED   Haemophilus influenzae NOT DETECTED NOT DETECTED   Neisseria meningitidis NOT DETECTED NOT DETECTED   Pseudomonas aeruginosa NOT DETECTED NOT DETECTED   Candida albicans NOT DETECTED NOT DETECTED   Candida glabrata NOT DETECTED NOT DETECTED   Candida krusei NOT DETECTED NOT DETECTED   Candida parapsilosis NOT DETECTED NOT DETECTED   Candida tropicalis NOT DETECTED NOT DETECTED    Name of physician (or Provider) Contacted: Dr Oletta Darter Changes to prescribed antibiotics required: Leave same for now  Dorrene German 07/20/2016  6:54 PM

## 2016-07-20 NOTE — H&P (Signed)
PULMONARY / CRITICAL CARE MEDICINE   Name: Heather Zimmerman. Zimmerman MRN: [id number] DOB: [birth year]    ADMISSION DATE:  07/19/2016 CONSULTATION DATE:  07/20/2016  REFERRING MD:  Dr. Joseph Berkshire  CHIEF COMPLAINT:  AMS, SOB   BRIEF Heather Zimmerman is a 70-y/o female with PMH significant for dCHF, T2DM, sleep apnea not on regular cpap, obesity hypoventilation syndrome on O2, PVD with chronic stasis ulcers and R BKA who presents from SNF with acute respiratory failure and lethargy. Duration of symptoms unknown. O2 sats 63% by EMS report but improved to 99% on non-rebreather. Per ED, initial GCS was 8, and patient was tachypneic with accessory muscle use. Per ED nurse, she initially was oriented at least to person and month but speech quickly began not to make sense. She was placed on bipap but continued to have increased WOB and decreased alertness so was intubated in the ED. Glucose was low at 52, so 1 amp of D50 given. CXR with evidence of pulmonary edema. 40 mg IV lasix given. No elevated LA or leukocytosis to suggest infection. ABG showed severe compensated respiratory acidosis with pH 7.3, pCO2 106, pO2 77.2, bicarb 50.6 (appears to have been obtained after intubation). BNP 51.1, slightly elevated from previous (39.7). SCr at baseline.   Of note, patient's insulin regimen had been increased significantly over the summer, from lantus 26 U daily with 5-10 U novolog with meals to lantus 42 U nightly, 26 U novolog with meals. Had ED visit 07/07/16 for episode of AMS and hypoglycemia to 46 that improved after administration of D50. She has had recent hospitalizations during which IV diuresis has improved respiratory status. Home meds include 20 mg torsemide.     STUDIES:  DG Chest 10/6 > Hypoexpanded lungs. Fluffy central airspace opacification suggestive of pulmonary edema. CT head 9/24 > No acute changes, intracranial mass or hemorrhage.   ECHO 12/21/15 > LVEF 60-65% with LVH and dilation of LA. PA  pressure 29.   CULTURES: Blood cx x2 10/6 >  Urine cx 10/6 >   ANTIBIOTICS: Ceftriaxone x 1 10/7 > 10/7  SIGNIFICANT EVENTS: 10/6 - Admitted with acute respiratory failure.   LINES/TUBES: ETT 10/6 > PIVs x 2 10/6 > Foley cath 10/6 >   SUBJECTIVE:  07/20/16 - Patient waking up to exam, on fent gtt. RN describes pus like urine   VITAL SIGNS: BP 118/63   Pulse 85   Temp 99.1 F (37.3 C) (Core (Comment))   Resp (!) 21   Ht 5\' 2"  (1.575 m)   Wt (!) 144.1 kg (317 lb 10.9 oz)   LMP  (LMP Unknown)   SpO2 99%   BMI 58.11 kg/m   HEMODYNAMICS:    VENTILATOR SETTINGS: Vent Mode: PRVC FiO2 (%):  [60 %] 60 % Set Rate:  [24 bmp-26 bmp] 26 bmp Vt Set:  [350 mL] 350 mL PEEP:  [5 cmH20] 5 cmH20 Plateau Pressure:  [27 cmH20-28 cmH20] 28 cmH20  INTAKE / OUTPUT: I/O last 3 completed shifts: In: -  Out: 900 [Urine:900]  PHYSICAL EXAMINATION: General:  Morbidly obese female, intubated Neuro:  Awakening to exam. In process of being sedated.  HEENT: ETT in place.  Cardiovascular: RRR, S1, S2, no m/r/g Lungs: Coarse breath sounds throughout.  Abdomen: Obese, soft, +BS.  Musculoskeletal: R BKA.  Skin: Open weeping ulcers of L shin. Moist skin of pannus but no skin breakdown or discoloration.   LABS:  PULMONARY  Recent Labs Lab 07/20/16 0250 07/20/16 0517  PHART 7.300* 7.545*  PCO2ART 106* 63.5*  PO2ART 77.2* 88.0  HCO3 50.6* 54.8*  TCO2  --  >50  O2SAT 94.3 97.0    CBC  Recent Labs Lab 07/19/16 2320 07/20/16 0310  HGB 11.9* 10.6*  HCT 44.4 40.8  WBC 9.8 7.0  PLT 193 184    COAGULATION No results for input(s): INR in the last 168 hours.  CARDIAC  No results for input(s): TROPONINI in the last 168 hours. No results for input(s): PROBNP in the last 168 hours.   CHEMISTRY  Recent Labs Lab 07/19/16 2320 07/20/16 0310  NA 140 145  K 3.9 4.1  CL 88* 94*  CO2 45* 45*  GLUCOSE 52* 122*  BUN 48* 45*  CREATININE 1.69* 1.66*  CALCIUM 8.8* 8.1*   MG  --  2.4  PHOS  --  3.5   Estimated Creatinine Clearance: 43 mL/min (by C-G formula based on SCr of 1.66 mg/dL (H)).   LIVER  Recent Labs Lab 07/19/16 2320  AST 19  ALT 16  ALKPHOS 71  BILITOT 0.6  PROT 8.1  ALBUMIN 2.9*     INFECTIOUS  Recent Labs Lab 07/20/16 0006  LATICACIDVEN 1.16     ENDOCRINE CBG (last 3)   Recent Labs  07/20/16 0407 07/20/16 0504 07/20/16 0806  GLUCAP 133* 159* 159*         IMAGING x48h  - image(s) personally visualized  -   highlighted in bold Dg Chest Port 1 View  Result Date: 07/20/2016 CLINICAL DATA:  Endotracheal tube placement EXAM: PORTABLE CHEST 1 VIEW COMPARISON:  07/19/2016 FINDINGS: Shallow inspiration. Cardiac enlargement without vascular congestion. Bilateral hilar prominence may represent infiltration or edema but hilar lymphadenopathy is not excluded. Follow-up with upright PA and lateral views of the chest when patient is able would be useful. No pleural effusions. No pneumothorax. Interval placement of endotracheal tube with tip measuring 2.6 cm above the carina. An enteric tube was placed with tip in the left upper quadrant consistent with location in the body of the stomach. IMPRESSION: Shallow inspiration. Cardiac enlargement. Infiltration or adenopathy demonstrated in the hilar regions. Appliances appear in satisfactory location. Electronically Signed   By: Lucienne Capers M.D.   On: 07/20/2016 02:17   Dg Chest Port 1 View  Result Date: 07/20/2016 CLINICAL DATA:  Acute onset of shortness of breath. Initial encounter. EXAM: PORTABLE CHEST 1 VIEW COMPARISON:  Chest radiograph performed 07/07/2016 FINDINGS: The lungs are hypoexpanded. Fluffy bilateral central airspace opacities raise concern for pulmonary edema, though pneumonia could have a similar appearance. No pleural effusion or pneumothorax is seen. The cardiomediastinal silhouette is borderline enlarged. No acute osseous abnormalities are seen. IMPRESSION:  Lungs hypoexpanded. Fluffy bilateral central airspace opacification raises concern for pulmonary edema, though pneumonia could have a similar appearance. This is worsened from the prior study. Borderline cardiomegaly. Electronically Signed   By: Garald Balding M.D.   On: 07/20/2016 00:44      DISCUSSION: 70-y/o female with dCHF, obesity hypoventilation syndrome, T2DM, and PVD who presents with acute on chronic respiratory failure, requiring intubation for decreased alertness and increased WOB. CO2 45, increased from baseline ~ 35, suggesting worsening chronic CO2 retention. Not adherent to nightly bipap per available records. CXR consistent with pulmonary edema. Hypoglycemia likely also contributing to decreased level of alertness. Do not suspect infectious cause at this time.   ASSESSMENT / PLAN:  PULMONARY A: Acute on chronic respiratory failure Obesity hypoventilation syndrome Pulmonary edema on CXR    - remains vent dependdent P:  Full vent support Dc Fentanyl gtt contnue  and propofol for sedation Repeat CXR 10/8 IV lasix Repeat ABG in am  CARDIOVASCULAR A:  dCHF, likely with exacerbation PVD   P:  Place picc line Check trop Repeat ECHO Obtain EKG IV lasix  RENAL A:   Impaired renal function, at baseline (SCr ~1.7)  - improving P:   Monitor with daily BMPs   GASTROINTESTINAL A:   Intubated P:   Pepcid prophylaxis  HEMATOLOGIC A:   No leukocytosis Mild anemia, likely dilutional  P:  Monitor with daily CBCs  INFECTIOUS A:   No LA, leukocytosis Venous stasis ulcers L shin P:   Follow blood cx Follow urine cx Obtain Wound Consult, has been on unna boots  ENDOCRINE A:   T2DM, Poorly controlled with hgb A1c of 10.1 Hypoglycemia, resolved (2nd reported episode); brittle? P:   SSI with dextrose per protocol - chagne d10 to d5  NEUROLOGIC A:   Acute encephalopathy in setting of hypercapnia, hypoglycemia    -slowly better P:   Daily  sedation interruption Change fent gtt to prn Continue diprivan gtt Correct hypoglycermia per protocol Wean off ventilator and sedation as able  RASS goal: 0 currently sedated  FAMILY  - Updates: No family at bedside 10/7,   - Inter-disciplinary family meet or Palliative Care meeting due by: 10/12      The patient is critically ill with multiple organ systems failure and requires high complexity decision making for assessment and support, frequent evaluation and titration of therapies, application of advanced monitoring technologies and extensive interpretation of multiple databases.   Critical Care Time devoted to patient care services described in this note is  30  Minutes. This time reflects time of care of this signee Dr Brand Males. This critical care time does not reflect procedure time, or teaching time or supervisory time of PA/NP/Med student/Med Resident etc but could involve care discussion time    Dr. Brand Males, M.D., [facility].C.P Pulmonary and Critical Care Medicine Staff Physician [facility] Pager: [phone], If no answer or between  15:00h - 7:00h: call [phone]  07/20/2016 9:53 AM

## 2016-07-20 NOTE — Progress Notes (Signed)
Pharmacy Antibiotic Note  Heather Zimmerman. Toal is a 70 y.o. female admitted on 07/19/2016 with UTI.  Pharmacy has been consulted for ceftazidime dosing.  Today, 07/20/2016  Renal : CKD  WBC WNL  afebrile  Plan:  Ceftazidime 1gm IV q12h based on renal function  Follow renal function and cultures  Height: 5\' 2"  (157.5 cm) Weight: (!) 317 lb 10.9 oz (144.1 kg) IBW/kg (Calculated) : 50.1  Temp (24hrs), Avg:99 F (37.2 C), Min:98.9 F (37.2 C), Max:99.1 F (37.3 C)   Recent Labs Lab 07/19/16 2320 07/20/16 0006 07/20/16 0310  WBC 9.8  --  7.0  CREATININE 1.69*  --  1.66*  LATICACIDVEN  --  1.16  --     Estimated Creatinine Clearance: 43 mL/min (by C-G formula based on SCr of 1.66 mg/dL (H)).    Allergies  Allergen Reactions  . Ace Inhibitors     unknown    Antimicrobials this admission: 10/7 ceftriaxone x 1 10/7 >> ceftazidime >>  Dose adjustments this admission:  Microbiology results: 10/6 BCx: pending 10/7 UCx: pending  10/7 MRSA PCR: positive  Thank you for allowing pharmacy to be a part of this patient's care.  Doreene Eland, PharmD, BCPS.   Pager: [phone] 07/20/2016 10:34 AM

## 2016-07-20 NOTE — ED Notes (Signed)
+   Color change to Co2 detector at this time.  Per Dr. Betsey Holiday equal breath sounds noted bilaterally.  ET tube secured at 24 cm at the lip line at this time.  ET tube oriented to the left side of pt's mouth and secured.

## 2016-07-20 NOTE — ED Notes (Signed)
Attempted to call report at this time 

## 2016-07-20 NOTE — Progress Notes (Signed)
Peripherally Inserted Central Catheter/Midline Placement  The IV Nurse has discussed with the patient and/or persons authorized to consent for the patient, the purpose of this procedure and the potential benefits and risks involved with this procedure.  The benefits include less needle sticks, lab draws from the catheter, and the patient may be discharged home with the catheter. Risks include, but not limited to, infection, bleeding, blood clot (thrombus formation), and puncture of an artery; nerve damage and irregular heartbeat and possibility to perform a PICC exchange if needed/ordered by physician.  Alternatives to this procedure were also discussed.  Bard Power PICC patient education guide, fact sheet on infection prevention and patient information card has been provided to patient /or left at bedside.  Phone consent obtain from brother Kathlene Cote.  PICC/Midline Placement Documentation  PICC Double Lumen [id number] PICC Right Basilic 46 cm 0 cm (Active)  Indication for Insertion or Continuance of Line Vasoactive infusions 07/20/2016  1:00 PM  Exposed Catheter (cm) 0 cm 07/20/2016  1:00 PM  Site Assessment Clean;Dry;Intact 07/20/2016  1:00 PM  Lumen #1 Status Flushed;Saline locked;Blood return noted 07/20/2016  1:00 PM  Lumen #2 Status Flushed;Saline locked;Blood return noted 07/20/2016  1:00 PM  Dressing Type Transparent 07/20/2016  1:00 PM  Dressing Status Clean;Dry;Intact;Antimicrobial disc in place 07/20/2016  1:00 PM  Dressing Change Due 07/27/16 07/20/2016  1:00 PM       Gordan Payment 07/20/2016, 1:09 PM

## 2016-07-21 ENCOUNTER — Inpatient Hospital Stay (HOSPITAL_COMMUNITY): Payer: Medicare Other

## 2016-07-21 DIAGNOSIS — J9602 Acute respiratory failure with hypercapnia: Secondary | ICD-10-CM

## 2016-07-21 LAB — GLUCOSE, CAPILLARY
GLUCOSE-CAPILLARY: 152 mg/dL — AB (ref 65–99)
GLUCOSE-CAPILLARY: 153 mg/dL — AB (ref 65–99)
GLUCOSE-CAPILLARY: 156 mg/dL — AB (ref 65–99)
GLUCOSE-CAPILLARY: 182 mg/dL — AB (ref 65–99)
GLUCOSE-CAPILLARY: 188 mg/dL — AB (ref 65–99)
Glucose-Capillary: 168 mg/dL — ABNORMAL HIGH (ref 65–99)
Glucose-Capillary: 174 mg/dL — ABNORMAL HIGH (ref 65–99)

## 2016-07-21 LAB — CBC WITH DIFFERENTIAL/PLATELET
BASOS ABS: 0 10*3/uL (ref 0.0–0.1)
BASOS PCT: 0 %
Eosinophils Absolute: 0.3 10*3/uL (ref 0.0–0.7)
Eosinophils Relative: 4 %
HEMATOCRIT: 36.5 % (ref 36.0–46.0)
HEMOGLOBIN: 10.1 g/dL — AB (ref 12.0–15.0)
Lymphocytes Relative: 22 %
Lymphs Abs: 1.5 10*3/uL (ref 0.7–4.0)
MCH: 26 pg (ref 26.0–34.0)
MCHC: 27.7 g/dL — ABNORMAL LOW (ref 30.0–36.0)
MCV: 94.1 fL (ref 78.0–100.0)
Monocytes Absolute: 0.7 10*3/uL (ref 0.1–1.0)
Monocytes Relative: 10 %
NEUTROS ABS: 4.5 10*3/uL (ref 1.7–7.7)
NEUTROS PCT: 64 %
Platelets: 185 10*3/uL (ref 150–400)
RBC: 3.88 MIL/uL (ref 3.87–5.11)
RDW: 18.8 % — ABNORMAL HIGH (ref 11.5–15.5)
WBC: 7 10*3/uL (ref 4.0–10.5)

## 2016-07-21 LAB — BLOOD GAS, ARTERIAL
Acid-Base Excess: 22.4 mmol/L — ABNORMAL HIGH (ref 0.0–2.0)
Bicarbonate: 50.6 mmol/L — ABNORMAL HIGH (ref 20.0–28.0)
DRAWN BY: 308601
FIO2: 40
O2 Saturation: 93 %
PATIENT TEMPERATURE: 38.1
PCO2 ART: 80.3 mmHg — AB (ref 32.0–48.0)
PEEP: 5 cmH2O
RATE: 18 resp/min
VT: 350 mL
pH, Arterial: 7.421 (ref 7.350–7.450)
pO2, Arterial: 71.5 mmHg — ABNORMAL LOW (ref 83.0–108.0)

## 2016-07-21 LAB — BASIC METABOLIC PANEL
ANION GAP: 6 (ref 5–15)
BUN: 47 mg/dL — ABNORMAL HIGH (ref 6–20)
CALCIUM: 8.2 mg/dL — AB (ref 8.9–10.3)
CHLORIDE: 94 mmol/L — AB (ref 101–111)
CO2: 45 mmol/L — AB (ref 22–32)
Creatinine, Ser: 1.54 mg/dL — ABNORMAL HIGH (ref 0.44–1.00)
GFR calc non Af Amer: 33 mL/min — ABNORMAL LOW (ref >60)
GFR, EST AFRICAN AMERICAN: 38 mL/min — AB (ref >60)
Glucose, Bld: 169 mg/dL — ABNORMAL HIGH (ref 65–99)
Potassium: 3.6 mmol/L (ref 3.5–5.1)
Sodium: 145 mmol/L (ref 135–145)

## 2016-07-21 LAB — URINE CULTURE

## 2016-07-21 LAB — TROPONIN I: TROPONIN I: 0.04 ng/mL — AB (ref <0.03)

## 2016-07-21 LAB — PROCALCITONIN

## 2016-07-21 MED ORDER — DEXTROSE 5 % IV SOLN
2.0000 g | INTRAVENOUS | Status: DC
  Administered 2016-07-21 – 2016-07-22 (×2): 2 g via INTRAVENOUS
  Filled 2016-07-21 (×3): qty 2

## 2016-07-21 MED ORDER — VITAL HIGH PROTEIN PO LIQD
1000.0000 mL | ORAL | Status: DC
  Administered 2016-07-21: 1000 mL
  Administered 2016-07-22 (×4)
  Administered 2016-07-22: 1000 mL
  Administered 2016-07-23 (×3)
  Filled 2016-07-21 (×3): qty 1000

## 2016-07-21 MED ORDER — RANITIDINE HCL 150 MG/10ML PO SYRP
150.0000 mg | ORAL_SOLUTION | Freq: Every day | ORAL | Status: DC
  Administered 2016-07-21 – 2016-07-24 (×4): 150 mg via ORAL
  Filled 2016-07-21 (×4): qty 10

## 2016-07-21 MED ORDER — PRO-STAT SUGAR FREE PO LIQD
30.0000 mL | Freq: Every day | ORAL | Status: DC
  Administered 2016-07-21 – 2016-07-23 (×9): 30 mL
  Filled 2016-07-21 (×9): qty 30

## 2016-07-21 MED ORDER — FAMOTIDINE 40 MG/5ML PO SUSR: 20.0000 mg | Freq: Every day | ORAL | Status: DC

## 2016-07-21 NOTE — Progress Notes (Signed)
eLink Physician-Brief Progress Note Patient Name: Heather Zimmerman. Chaviano DOB: [birth year] MRN: [id number]   Date of Service  07/21/2016  HPI/Events of Note  Camera check on patient with acute respiratory failure. Status post intubation. Sedated. FiO2 now 0.4 & PEEP 5. Saturating 97%.   eICU Interventions  1. Continuing current ventilator settings 2. ABG in a.m.      Intervention Category Major Interventions: Respiratory failure - evaluation and management  Tera Partridge 07/21/2016, 2:01 AM

## 2016-07-21 NOTE — Progress Notes (Signed)
Initial Nutrition Assessment  DOCUMENTATION CODES:   Morbid obesity  INTERVENTION:  Recommend changing TF regimen as follows: Reduce Vital HP to new goal of 20 ml/hr. Increase Pro-Stat to 5x/day.  Provides 1276 kcal, 117 grams protein, 403 ml H2O with current propofol rate.   RD will continue to monitor and assess for nutrition-related needs.  NUTRITION DIAGNOSIS:   Inadequate oral intake related to inability to eat as evidenced by NPO status.  GOAL:   Patient will meet greater than or equal to 90% of their needs  MONITOR:   Vent status, Labs, Weight trends, TF tolerance, I & O's, Skin  REASON FOR ASSESSMENT:   Consult Enteral/tube feeding initiation and management  ASSESSMENT:   70-y/o female with PMH significant for dCHF, T2DM, sleep apnea not on regular cpap, obesity hypoventilation syndrome on O2, PVD with chronic stasis ulcers and R BKA who presents from SNF with acute respiratory failure and lethargy. She was placed on bipap but continued to have increased WOB and decreased alertness so was intubated in the ED. Pulmonary edema on CXR.   Patient awake on assessment. RD attempted to ask patient questions she could answer by shaking head yes or no, but she was unable to respond. No family present at bedside. Discussed plan with RN. Plan is likely to extubate today or tomorrow.   ICU TF Protocol initiated 10/7. Vital HP running @ 40 ml/hr and pt ordered for 30 ml Pro-Stat BID.   Patient is currently intubated on ventilator support MV: 6 L/min Temp (24hrs), Avg:100 F (37.8 C), Min:99.3 F (37.4 C), Max:100.5 F (38.1 C)  Propofol: 11.2 ml/hr (provides 296 kcal daily)  Medications reviewed and include: famotidine, Novolog sliding scale Q4hrs.  Drips: D5-1/2NS @ 50 ml/hr (provides 60 grams dextrose, 204 kcal daily)  Labs reviewed: CBG 151-182 past 24 hrs, Chloride 94, Co2 45, BUN 47, Creatinine 1.54, TG 149 on 10/7.   BP: 116/56 MAP: 76  Access: Pt with OG  tube; terminates in stomach per chest x-ray  Nutrition-Focused physical exam completed. Findings are no fat depletion, no muscle depletion, and no edema. Patient with right BKA, left leg appears to have several wounds and left foot is very dark in color - only assessed this leg by looking.   Diet Order:  Diet NPO time specified  Skin:  Wound (see comment) (Open wounds left leg, Right BKA)  Last BM:  PTA  Height:   Ht Readings from Last 1 Encounters:  07/20/16 5\' 2"  (1.575 m)    Weight:   Wt Readings from Last 1 Encounters:  07/21/16 (!) 319 lb 7.1 oz (144.9 kg)    Ideal Body Weight:  46.75 kg  BMI:  Body mass index is 58.43 kg/m.  Estimated Nutritional Needs:   Kcal:  1029-1167 (22-25 kcal/kg IBW)  Protein:  >/= 117 grams (>/= 2.5 g/kg IBW)  Fluid:  3.3 L/day (Obese Adult Method)  EDUCATION NEEDS:   No education needs identified at this time  Willey Blade, MS, RD, LDN Pager: [phone] After Hours Pager: [phone]

## 2016-07-21 NOTE — Progress Notes (Signed)
PULMONARY / CRITICAL CARE MEDICINE   Name: Heather Zimmerman. Vanderwal MRN: [id number] DOB: 29-Dec-1945    ADMISSION DATE:  07/19/2016 CONSULTATION DATE:  07/20/2016  REFERRING MD:  Dr. Joseph Berkshire  CHIEF COMPLAINT:  AMS, SOB   BRIEF Heather Zimmerman is a 70-y/o female with PMH significant for dCHF, T2DM, sleep apnea not on regular cpap, obesity hypoventilation syndrome on O2, PVD with chronic stasis ulcers and R BKA who presents from SNF with acute respiratory failure and lethargy. Duration of symptoms unknown. O2 sats 63% by EMS report but improved to 99% on non-rebreather. Per ED, initial GCS was 8, and patient was tachypneic with accessory muscle use. Per ED nurse, she initially was oriented at least to person and month but speech quickly began not to make sense. She was placed on bipap but continued to have increased WOB and decreased alertness so was intubated in the ED. Glucose was low at 52, so 1 amp of D50 given. CXR with evidence of pulmonary edema. 40 mg IV lasix given. No elevated LA or leukocytosis to suggest infection. ABG showed severe compensated respiratory acidosis with pH 7.3, pCO2 106, pO2 77.2, bicarb 50.6 (appears to have been obtained after intubation). BNP 51.1, slightly elevated from previous (39.7). SCr at baseline.   Of note, patient's insulin regimen had been increased significantly over the summer, from lantus 26 U daily with 5-10 U novolog with meals to lantus 42 U nightly, 26 U novolog with meals. Had ED visit 07/07/16 for episode of AMS and hypoglycemia to 46 that improved after administration of D50. She has had recent hospitalizations during which IV diuresis has improved respiratory status. Home meds include 20 mg torsemide.     STUDIES:  DG Chest 10/6 > Hypoexpanded lungs. Fluffy central airspace opacification suggestive of pulmonary edema. CT head 9/24 > No acute changes, intracranial mass or hemorrhage.   ECHO 12/21/15 > LVEF 60-65% with LVH and dilation of LA. PA  pressure 29.   CULTURES: Blood cx x2 10/6 >  Urine cx 10/6 >   ANTIBIOTICS: Ceftriaxone x 1 10/7 > 10/7, 10/8 (coag neg staph) >> Ceftaz 07/20/16 > 10/8   LINES/TUBES: ETT 10/6 > PIVs x 2 10/6 > Foley cath 10/6 >  SIGNIFICANT EVENTS: 10/6 - Admitted with acute respiratory failure.  07/20/16 - Patient waking up to exam, on fent gtt. RN describes pus like urine   SUBJECTIVE/OVERNIGHT/INTERVAL HX 07/21/16 - doing SBT but needing diprivan for it. RN says wihtout diprivan very tachypneic. Pharmacy says true +ve coag neg staph.ETT at carina and retracted. RAdiology call felt cxr worse. Not in pressors    VITAL SIGNS: BP (!) 116/56 (BP Location: Left Arm)   Pulse 90   Temp 99.9 F (37.7 C)   Resp 14   Ht 5\' 2"  (1.575 m)   Wt (!) 144.9 kg (319 lb 7.1 oz)   LMP  (LMP Unknown)   SpO2 96%   BMI 58.43 kg/m   HEMODYNAMICS:    VENTILATOR SETTINGS: Vent Mode: PSV;CPAP FiO2 (%):  [40 %-50 %] 40 % Set Rate:  [18 bmp] 18 bmp Vt Set:  [350 mL-364 mL] 350 mL PEEP:  [5 cmH20] 5 cmH20 Pressure Support:  [5 cmH20] 5 cmH20 Plateau Pressure:  [21 cmH20-28 cmH20] 28 cmH20  INTAKE / OUTPUT: I/O last 3 completed shifts: In: 1915.7 [I.V.:1515.7; Other:100; NG/GT:200; IV Piggyback:100] Out: 2035 [Urine:2035]  PHYSICAL EXAMINATION: General:  Morbidly obese female, intubated Neuro:  Awake on diprivan. Trying to interact HEENT: ETT in place.  Cardiovascular: RRR, S1, S2, no m/r/g Lungs: Coarse breath sounds throughout.  Abdomen: Obese, soft, +BS.  Musculoskeletal: R BKA.  Skin: Open weeping ulcers of L shin. Moist skin of pannus but no skin breakdown or discoloration.   LABS:  PULMONARY  Recent Labs Lab 07/20/16 0250 07/20/16 0517 07/20/16 0915 07/21/16 0335  PHART 7.300* 7.545*  --  7.421  PCO2ART 106* 63.5*  --  80.3*  PO2ART 77.2* 88.0  --  71.5*  HCO3 50.6* 54.8* 50.9* 50.6*  TCO2  --  >50  --   --   O2SAT 94.3 97.0 78.2 93.0    CBC  Recent Labs Lab  07/19/16 2320 07/20/16 0310 07/21/16 0349  HGB 11.9* 10.6* 10.1*  HCT 44.4 40.8 36.5  WBC 9.8 7.0 7.0  PLT 193 184 185    COAGULATION No results for input(s): INR in the last 168 hours.  CARDIAC    Recent Labs Lab 07/20/16 1017 07/20/16 1430 07/21/16 0349  TROPONINI 0.05* 0.05* 0.04*   No results for input(s): PROBNP in the last 168 hours.   CHEMISTRY  Recent Labs Lab 07/19/16 2320 07/20/16 0310 07/21/16 0349  NA 140 145 145  K 3.9 4.1 3.6  CL 88* 94* 94*  CO2 45* 45* 45*  GLUCOSE 52* 122* 169*  BUN 48* 45* 47*  CREATININE 1.69* 1.66* 1.54*  CALCIUM 8.8* 8.1* 8.2*  MG  --  2.4  --   PHOS  --  3.5  --    Estimated Creatinine Clearance: 46.5 mL/min (by C-G formula based on SCr of 1.54 mg/dL (H)).   LIVER  Recent Labs Lab 07/19/16 2320  AST 19  ALT 16  ALKPHOS 71  BILITOT 0.6  PROT 8.1  ALBUMIN 2.9*     INFECTIOUS  Recent Labs Lab 07/20/16 0006 07/20/16 1017 07/21/16 0349  LATICACIDVEN 1.16  --   --   PROCALCITON  --  <0.10 <0.10     ENDOCRINE CBG (last 3)   Recent Labs  07/21/16 0045 07/21/16 0437 07/21/16 0820  GLUCAP 182* 168* 153*         IMAGING x48h  - image(s) personally visualized  -   highlighted in bold Dg Chest Port 1 View  Result Date: 07/21/2016 CLINICAL DATA:  Respiratory failure. EXAM: PORTABLE CHEST 1 VIEW COMPARISON:  07/20/2016. FINDINGS: Cardiomegaly. BILATERAL pulmonary opacities consistent with pulmonary edema. BILATERAL pneumonia not excluded, but considering the rapid worsening, edema is favored BILATERAL pleural effusions. Overall worsening aeration. ET tube has been advanced, the tip now lies at or near the carina. Tube should be withdrawn 2-3 cm and radiograph repeated. A PICC line has been placed, RIGHT arm approach, and lies at the cavoatrial junction. Orogastric tube is coiled in the stomach IMPRESSION: Cardiomegaly with suspected pulmonary edema. ETT too low.  Withdrawn 2-3 cm. A call has been  placed to the intensive care unit to speak to the patient's caregivers. Electronically Signed   By: Staci Righter M.D.   On: 07/21/2016 07:19   Dg Chest Port 1 View  Result Date: 07/20/2016 CLINICAL DATA:  Endotracheal tube placement EXAM: PORTABLE CHEST 1 VIEW COMPARISON:  07/19/2016 FINDINGS: Shallow inspiration. Cardiac enlargement without vascular congestion. Bilateral hilar prominence may represent infiltration or edema but hilar lymphadenopathy is not excluded. Follow-up with upright PA and lateral views of the chest when patient is able would be useful. No pleural effusions. No pneumothorax. Interval placement of endotracheal tube with tip measuring 2.6 cm above the carina. An enteric tube was placed  with tip in the left upper quadrant consistent with location in the body of the stomach. IMPRESSION: Shallow inspiration. Cardiac enlargement. Infiltration or adenopathy demonstrated in the hilar regions. Appliances appear in satisfactory location. Electronically Signed   By: Lucienne Capers M.D.   On: 07/20/2016 02:17   Dg Chest Port 1 View  Result Date: 07/20/2016 CLINICAL DATA:  Acute onset of shortness of breath. Initial encounter. EXAM: PORTABLE CHEST 1 VIEW COMPARISON:  Chest radiograph performed 07/07/2016 FINDINGS: The lungs are hypoexpanded. Fluffy bilateral central airspace opacities raise concern for pulmonary edema, though pneumonia could have a similar appearance. No pleural effusion or pneumothorax is seen. The cardiomediastinal silhouette is borderline enlarged. No acute osseous abnormalities are seen. IMPRESSION: Lungs hypoexpanded. Fluffy bilateral central airspace opacification raises concern for pulmonary edema, though pneumonia could have a similar appearance. This is worsened from the prior study. Borderline cardiomegaly. Electronically Signed   By: Garald Balding M.D.   On: 07/20/2016 00:44      DISCUSSION: 70-y/o female with dCHF, obesity hypoventilation syndrome, T2DM, and  PVD who presents with acute on chronic respiratory failure, requiring intubation for decreased alertness and increased WOB. CO2 45, increased from baseline ~ 35, suggesting worsening chronic CO2 retention. Not adherent to nightly bipap per available records. CXR consistent with pulmonary edema. Hypoglycemia likely also contributing to decreased level of alertness. Do not suspect infectious cause at this time.   ASSESSMENT / PLAN:  PULMONARY A: Acute on chronic respiratory failure Obesity hypoventilation syndrome Pulmonary edema on CXR    -doing sbt but diprivan need and pco2 80 preclude extubation    P:   Full vent support  propofol for sedation Repeat CXR 10/8 for et tube positoon IV lasix Repeat ABG in am  CARDIOVASCULAR A:  dCHF, likely with exacerbation PVD  - trop not significant  P:   picc line Await Repeat ECHO IV lasix -mihg need diamox  RENAL A:   Impaired renal function, at baseline (SCr ~1.7)  - improving P:   Monitor with daily BMPs Dc d5 at 50cc/h  GASTROINTESTINAL A:   Intubated P:   Pepcid prophylaxis  HEMATOLOGIC A:   No leukocytosis Mild anemia, likely dilutional  P:  Monitor with daily CBCs  INFECTIOUS A:   No LA, leukocytosis Venous stasis ulcers L shin   = true +ve coag neg staph bacteremic per BCID per pharm  P:   chjange back to ceftriazoxine Follow blood cx Follow urine cx Obtain Wound Consult, has been on unna boots  ENDOCRINE A:   T2DM, Poorly controlled with hgb A1c of 10.1 Hypoglycemia, resolved (2nd reported episode); brittle? P:   SSI with dextrose per protocol - chagne d10 to d5  NEUROLOGIC A:   Acute encephalopathy in setting of hypercapnia, hypoglycemia    -slowly better P:   Daily sedation interruption Change fent gtt to prn Continue diprivan gtt Correct hypoglycermia per protocol Wean off ventilator and sedation as able  RASS goal: 0 currently sedated  FAMILY  - Updates: No family at bedside  10/7,   - Inter-disciplinary family meet or Palliative Care meeting due by: 10/12      The patient is critically ill with multiple organ systems failure and requires high complexity decision making for assessment and support, frequent evaluation and titration of therapies, application of advanced monitoring technologies and extensive interpretation of multiple databases.   Critical Care Time devoted to patient care services described in this note is  30  Minutes. This time reflects time of  care of this signee Dr Brand Males. This critical care time does not reflect procedure time, or teaching time or supervisory time of PA/NP/Med student/Med Resident etc but could involve care discussion time    Dr. Brand Males, M.D., [facility].C.P Pulmonary and Critical Care Medicine Staff Physician [place] [facility] Pager: [phone], If no answer or between  15:00h - 7:00h: call [phone]  07/21/2016 10:35 AM

## 2016-07-22 ENCOUNTER — Inpatient Hospital Stay (HOSPITAL_COMMUNITY): Payer: Medicare Other

## 2016-07-22 DIAGNOSIS — J80 Acute respiratory distress syndrome: Secondary | ICD-10-CM

## 2016-07-22 LAB — CBC WITH DIFFERENTIAL/PLATELET
BASOS PCT: 0 %
Basophils Absolute: 0 10*3/uL (ref 0.0–0.1)
EOS ABS: 0.3 10*3/uL (ref 0.0–0.7)
Eosinophils Relative: 4 %
HEMATOCRIT: 36 % (ref 36.0–46.0)
HEMOGLOBIN: 10 g/dL — AB (ref 12.0–15.0)
LYMPHS ABS: 1.3 10*3/uL (ref 0.7–4.0)
Lymphocytes Relative: 18 %
MCH: 26.4 pg (ref 26.0–34.0)
MCHC: 27.8 g/dL — AB (ref 30.0–36.0)
MCV: 95 fL (ref 78.0–100.0)
MONO ABS: 1 10*3/uL (ref 0.1–1.0)
MONOS PCT: 13 %
NEUTROS ABS: 4.9 10*3/uL (ref 1.7–7.7)
Neutrophils Relative %: 65 %
Platelets: 191 10*3/uL (ref 150–400)
RBC: 3.79 MIL/uL — ABNORMAL LOW (ref 3.87–5.11)
RDW: 19.1 % — AB (ref 11.5–15.5)
WBC: 7.5 10*3/uL (ref 4.0–10.5)

## 2016-07-22 LAB — CULTURE, BLOOD (ROUTINE X 2)

## 2016-07-22 LAB — GLUCOSE, CAPILLARY
GLUCOSE-CAPILLARY: 140 mg/dL — AB (ref 65–99)
GLUCOSE-CAPILLARY: 141 mg/dL — AB (ref 65–99)
GLUCOSE-CAPILLARY: 171 mg/dL — AB (ref 65–99)
Glucose-Capillary: 172 mg/dL — ABNORMAL HIGH (ref 65–99)
Glucose-Capillary: 182 mg/dL — ABNORMAL HIGH (ref 65–99)
Glucose-Capillary: 180 mg/dL — ABNORMAL HIGH (ref 65–99)

## 2016-07-22 LAB — BASIC METABOLIC PANEL
Anion gap: 3 — ABNORMAL LOW (ref 5–15)
BUN: 40 mg/dL — AB (ref 6–20)
CALCIUM: 8.8 mg/dL — AB (ref 8.9–10.3)
CHLORIDE: 97 mmol/L — AB (ref 101–111)
CO2: 49 mmol/L — AB (ref 22–32)
CREATININE: 1.4 mg/dL — AB (ref 0.44–1.00)
GFR calc non Af Amer: 37 mL/min — ABNORMAL LOW (ref >60)
GFR, EST AFRICAN AMERICAN: 43 mL/min — AB (ref >60)
Glucose, Bld: 147 mg/dL — ABNORMAL HIGH (ref 65–99)
Potassium: 3.8 mmol/L (ref 3.5–5.1)
Sodium: 149 mmol/L — ABNORMAL HIGH (ref 135–145)

## 2016-07-22 LAB — ECHOCARDIOGRAM COMPLETE
HEIGHTINCHES: 62 in
WEIGHTICAEL: 5104 [oz_av]

## 2016-07-22 LAB — PROCALCITONIN: Procalcitonin: <0.1 ng/mL

## 2016-07-22 MED ORDER — VANCOMYCIN HCL 10 G IV SOLR
2500.0000 mg | Freq: Once | INTRAVENOUS | Status: AC
  Administered 2016-07-22: 2500 mg via INTRAVENOUS
  Filled 2016-07-22: qty 2500

## 2016-07-22 MED ORDER — SODIUM CHLORIDE 0.9 % IV SOLN
1500.0000 mg | INTRAVENOUS | Status: DC
  Filled 2016-07-22: qty 1500

## 2016-07-22 MED ORDER — FUROSEMIDE 10 MG/ML IJ SOLN
40.0000 mg | Freq: Every day | INTRAMUSCULAR | Status: DC
  Administered 2016-07-22 – 2016-07-24 (×3): 40 mg via INTRAVENOUS
  Filled 2016-07-22 (×3): qty 4

## 2016-07-22 MED ORDER — PERFLUTREN LIPID MICROSPHERE
1.0000 mL | INTRAVENOUS | Status: AC | PRN
  Administered 2016-07-22: 2 mL via INTRAVENOUS
  Filled 2016-07-22: qty 10

## 2016-07-22 MED ORDER — PERFLUTREN LIPID MICROSPHERE
INTRAVENOUS | Status: AC
  Filled 2016-07-22: qty 10

## 2016-07-22 MED ORDER — FREE WATER
50.0000 mL | Freq: Four times a day (QID) | Status: DC
  Administered 2016-07-22 – 2016-07-24 (×8): 50 mL

## 2016-07-22 NOTE — Progress Notes (Signed)
Pharmacy Antibiotic Note  Heather Zimmerman. [name] is a 70 y.o. female admitted on 07/19/2016 with UTI.  Blood cx +2/2 CoNS which is now showing methacillin resistance.    Today, 07/22/2016  Renal : CKD, NCrCl ~ 23ml/min  WBC WNL  Still spiking fevers, Tm 101.1  Plan:  Spoke with Dr Halford Chessman & will change abx to Vancomycin 2500mg  IV x1 now then 1500mg  IV q24h  Follow renal function and cultures  Recommend repeat blood cx tomorrow (after on appropriate tx x 24hrs)  Height: 5\' 2"  (157.5 cm) Weight: (!) 319 lb (144.7 kg) IBW/kg (Calculated) : 50.1  Temp (24hrs), Avg:100.4 F (38 C), Min:100 F (37.8 C), Max:101.1 F (38.4 C)   Recent Labs Lab 07/19/16 2320 07/20/16 0006 07/20/16 0310 07/21/16 0349 07/22/16 0400  WBC 9.8  --  7.0 7.0 7.5  CREATININE 1.69*  --  1.66* 1.54* 1.40*  LATICACIDVEN  --  1.16  --   --   --     Estimated Creatinine Clearance: 51.1 mL/min (by C-G formula based on SCr of 1.4 mg/dL (H)).    Allergies  Allergen Reactions  . Ace Inhibitors     unknown    Antimicrobials this admission: 10/7 ceftriaxone x 1, resume 10/8 >>10/9 10/7 >> ceftazidime >>10/8 10/9 Vanc>>  Dose adjustments this admission:  Microbiology results: 10/6 BCx: 2/2 CoNS - sens Cipro, gent, Vanc (BCID staph species with no resistance, but Blood Cx R-oxacillin) 10/7 UCx: multiple, suggest recollection 10/7 MRSA PCR: positive 10/9 UCx (repeat):   Thank you for allowing pharmacy to be a part of this patient's care.  Netta Cedars, PharmD, BCPS Pager: [phone] 07/22/2016 10:16 AM

## 2016-07-22 NOTE — Consult Note (Signed)
[facility] Nurse wound consult note Reason for Consult:Chronic venous stasis ulceration to left lower leg. Right BKA.  Acute respiratory failure.  Wound type:Chronic venous stasis Pressure Ulcer POA: N/A Measurement:22 cm x 20 cm x 0.1 cm circumferentially.  0.5 cm scabbed lesion present to anterior lower leg.  Legs are dry today with little edema.  Due to respiratory failure, will not apply compression today. Will keep legs moist to promote healing and monitor.  Wound [id number] and moist Drainage (amount, consistency, odor) None noted.  No edema.  No odor.  Periwound:Scarring from previous healed ulcers Dressing procedure/placement/frequency:Cleanse left lower leg with soap and water.  Apply vaseline gauze to nonintact skin.  Wrap with kerlix and secure with tape.  Change daily. Will not follow at this time.  Please re-consult if needed.  Domenic Moras RN BSN [place] Pager [phone]

## 2016-07-22 NOTE — Progress Notes (Signed)
  Echocardiogram 2D Echocardiogram with Definity has been performed.  Heather Zimmerman 07/22/2016, 1:48 PM

## 2016-07-22 NOTE — Progress Notes (Signed)
PULMONARY / CRITICAL CARE MEDICINE   Name: Heather Zimmerman. Pearl MRN: [id number] DOB: 06/15/1946    ADMISSION DATE:  07/19/2016 CONSULTATION DATE:  07/20/2016  REFERRING MD:  Dr. Joseph Berkshire  CHIEF COMPLAINT:  AMS, SOB   BRIEF SUMMARY: 70-y/o female with PMH significant for dCHF, T2DM, sleep apnea not on regular cpap, obesity hypoventilation syndrome on O2, PVD with chronic stasis ulcers and R BKA who presents from SNF with acute respiratory failure and lethargy. Duration of symptoms unknown. O2 sats 63% by EMS report but improved to 99% on non-rebreather. Per ED, initial GCS was 8, and patient was tachypneic with accessory muscle use. Per ED nurse, she initially was oriented at least to person and month but speech quickly began not to make sense. She was placed on bipap but continued to have increased WOB and decreased alertness so was intubated in the ED. Glucose was low at 52, so 1 amp of D50 given. CXR with evidence of pulmonary edema. 40 mg IV lasix given. No elevated LA or leukocytosis to suggest infection. ABG showed severe compensated respiratory acidosis with pH 7.3, pCO2 106, pO2 77.2, bicarb 50.6 (appears to have been obtained after intubation). BNP 51.1, slightly elevated from previous (39.7). SCr at baseline.   Of note, patient's insulin regimen had been increased significantly over the summer, from lantus 26 U daily with 5-10 U novolog with meals to lantus 42 U nightly, 26 U novolog with meals. Had ED visit 07/07/16 for episode of AMS and hypoglycemia to 46 that improved after administration of D50. She has had recent hospitalizations during which IV diuresis has improved respiratory status. Home meds include 20 mg torsemide.   SUBJECTIVE:  RN reports no acute events.  WUA pt became tachypneic off propofol, restarted at 8 mcg & pt appears comfortable.  PSV weaning pending. Tmax 100.2    VITAL SIGNS: BP (!) 110/47   Pulse 82   Temp 100.2 F (37.9 C)   Resp 18   Ht 5\' 2"  (1.575  m)   Wt (!) 319 lb (144.7 kg)   LMP  (LMP Unknown)   SpO2 94%   BMI 58.35 kg/m   HEMODYNAMICS:    VENTILATOR SETTINGS: Vent Mode: PRVC FiO2 (%):  [30 %-40 %] 30 % Set Rate:  [18 bmp] 18 bmp Vt Set:  [350 mL] 350 mL PEEP:  [5 cmH20] 5 cmH20 Plateau Pressure:  [22 cmH20-24 cmH20] 24 cmH20  INTAKE / OUTPUT: I/O last 3 completed shifts: In: 2569 [I.V.:1299; Other:100; NG/GT:1120; IV Piggyback:50] Out: 2480 [Urine:2480]  PHYSICAL EXAMINATION: General:  Morbidly obese female, intubated in NAD Neuro:  Awake, alert on diprovan 8 mcg, MAE, generalized weakness  HEENT: ETT in place, mm pink/moist, unable to appreciate JVD Cardiovascular: RRR, S1, S2, no m/r/g Lungs: non-labored, lungs bilaterally coarse  Abdomen: Obese, soft, +BS.  Musculoskeletal: R BKA.  Skin: Open weeping ulcers of L shin. Moist skin of pannus but no skin breakdown or discoloration.   LABS:  PULMONARY  Recent Labs Lab 07/20/16 0250 07/20/16 0517 07/20/16 0915 07/21/16 0335  PHART 7.300* 7.545*  --  7.421  PCO2ART 106* 63.5*  --  80.3*  PO2ART 77.2* 88.0  --  71.5*  HCO3 50.6* 54.8* 50.9* 50.6*  TCO2  --  >50  --   --   O2SAT 94.3 97.0 78.2 93.0    CBC  Recent Labs Lab 07/20/16 0310 07/21/16 0349 07/22/16 0400  HGB 10.6* 10.1* 10.0*  HCT 40.8 36.5 36.0  WBC 7.0 7.0 7.5  PLT 184 185 191    COAGULATION No results for input(s): INR in the last 168 hours.  CARDIAC    Recent Labs Lab 07/20/16 1017 07/20/16 1430 07/21/16 0349  TROPONINI 0.05* 0.05* 0.04*   No results for input(s): PROBNP in the last 168 hours.   CHEMISTRY  Recent Labs Lab 07/19/16 2320 07/20/16 0310 07/21/16 0349 07/22/16 0400  NA 140 145 145 149*  K 3.9 4.1 3.6 3.8  CL 88* 94* 94* 97*  CO2 45* 45* 45* 49*  GLUCOSE 52* 122* 169* 147*  BUN 48* 45* 47* 40*  CREATININE 1.69* 1.66* 1.54* 1.40*  CALCIUM 8.8* 8.1* 8.2* 8.8*  MG  --  2.4  --   --   PHOS  --  3.5  --   --    Estimated Creatinine Clearance:  51.1 mL/min (by C-G formula based on SCr of 1.4 mg/dL (H)).   LIVER  Recent Labs Lab 07/19/16 2320  AST 19  ALT 16  ALKPHOS 71  BILITOT 0.6  PROT 8.1  ALBUMIN 2.9*     INFECTIOUS  Recent Labs Lab 07/20/16 0006 07/20/16 1017 07/21/16 0349 07/22/16 0400  LATICACIDVEN 1.16  --   --   --   PROCALCITON  --  <0.10 <0.10 <0.10    ENDOCRINE CBG (last 3)   Recent Labs  07/21/16 2342 07/22/16 0302 07/22/16 0836  GLUCAP 174* 140* 141*     IMAGING x48h  - image(s) personally visualized  -   highlighted in bold Dg Chest Port 1 View  Result Date: 07/22/2016 CLINICAL DATA:  Endotracheal tube placement, CHF EXAM: PORTABLE CHEST 1 VIEW COMPARISON:  07/21/2016 FINDINGS: Endotracheal tube with the tip 2.2 cm above the carina. Nasogastric tube coursing below the diaphragm not well visualized. Right sided PICC line with the tip projecting over the SVC. Stable cardiomegaly. Bilateral interstitial thickening and enlargement of the central pulmonary vasculature. No right pleural effusion. Small left pleural effusion. No pneumothorax. No acute osseous abnormality. IMPRESSION: 1. Findings consistent with mild pulmonary edema. 2. Support lines and tubing in unchanged position. Electronically Signed   By: Kathreen Devoid   On: 07/22/2016 07:14   Dg Chest Port 1 View  Result Date: 07/21/2016 CLINICAL DATA:  Respiratory failure. EXAM: PORTABLE CHEST 1 VIEW COMPARISON:  07/20/2016. FINDINGS: Cardiomegaly. BILATERAL pulmonary opacities consistent with pulmonary edema. BILATERAL pneumonia not excluded, but considering the rapid worsening, edema is favored BILATERAL pleural effusions. Overall worsening aeration. ET tube has been advanced, the tip now lies at or near the carina. Tube should be withdrawn 2-3 cm and radiograph repeated. A PICC line has been placed, RIGHT arm approach, and lies at the cavoatrial junction. Orogastric tube is coiled in the stomach IMPRESSION: Cardiomegaly with suspected  pulmonary edema. ETT too low.  Withdrawn 2-3 cm. A call has been placed to the intensive care unit to speak to the patient's caregivers. Electronically Signed   By: Staci Righter M.D.   On: 07/21/2016 07:19    STUDIES:  DG Chest 10/6 > Hypoexpanded lungs. Fluffy central airspace opacification suggestive of pulmonary edema. CT head 9/24 > No acute changes, intracranial mass or hemorrhage.   ECHO 12/21/15 > LVEF 60-65% with LVH and dilation of LA. PA pressure 29.   CULTURES: Blood cx x2 10/6 >> coag neg staph 2/2 bottles >> R - oxaxillin, S- vanco Urine cx 10/6 >> multiple species, recollection suggested  ANTIBIOTICS: Ceftriaxone x 1 10/7 > 10/7, 10/8 (coag neg staph) >> 10/9 Ceftaz 07/20/16 > 10/8  Vancomycin 10/9 (coag neg staph) >>   LINES/TUBES: ETT 10/6 >> Foley cath 10/6 >> RUE PICC 10/7 >>  SIGNIFICANT EVENTS: 10/06  Admitted with acute respiratory failure.  10/07  Patient waking up to exam, on fent gtt. RN describes pus like urine 10/08  SBT but needing diprivan due to tachypnea. Pharmacy says true + coag neg staph. ETT at carina and retracted.  10/09  Mild fever persists, blood cultures positive for coag neg staph 2/2, sensitive to vanco  DISCUSSION: 70-y/o female with dCHF, obesity hypoventilation syndrome, T2DM, and PVD who presents with acute on chronic respiratory failure, requiring intubation for decreased alertness and increased WOB. CO2 45, increased from baseline ~ 35, suggesting worsening chronic CO2 retention. Not adherent to nightly bipap per available records. CXR consistent with pulmonary edema. Hypoglycemia likely also contributing to decreased level of alertness. Course notable for coag neg staph bacteremia.     ASSESSMENT / PLAN:  PULMONARY A: Acute on chronic hypercarbic respiratory failure Obesity hypoventilation syndrome with acute decompensation Pulmonary edema on CXR P:   PRVC 8 cc/kg  Wean PEEP / FiO2 for sats >92% Intermittent CXR Baseline pCO2  ~80 PSV as tolerated  CARDIOVASCULAR A:  dCHF -  with concern for exacerbation PVD Mild Elevation in Troponin - likely demand ischemia P:  Monitor PICC line site Repeat ECHO to ensure no endocarditis  Lasix 40 mg IV daily   RENAL A:   Impaired renal function, at baseline (SCr ~1.7) P:   Monitor BMP / UOP  NS @ KVO Replace electrolytes as indicated  GASTROINTESTINAL A:   Morbid Obesity  P:   Pepcid for SUP  TF per nutrition   HEMATOLOGIC A:   No leukocytosis Mild anemia, likely dilutional  P:  Monitor with daily CBCs Heparin for SQ DVT prophylaxis   INFECTIOUS A:   No LA, leukocytosis Venous stasis ulcers L shin COAG Neg Staph Bacteremia - note both bottles with staph P:   Change abx to vancomycin  Repeat blood cultures in am 10/10 after 24 hours on vancomycin to ensure clearance Repeat urine culture now Obtain Wound Consult, has had on unna boot Trend PCT   ENDOCRINE A:   T2DM - poorly controlled with hgb A1c of 10.1 Hypoglycemia, resolved (2nd reported episode); brittle? P:   ICU hyperglycemia SSI Monitor for hypoglycemia  NEUROLOGIC A:   Acute encephalopathy - in setting of hypercapnia, hypoglycemia, resolving P:   Daily WUA / SBT PRN fentanyl for pain  Continue diprivan gtt RASS goal: 0  FAMILY  - Updates: No family at bedside 10/9  - Inter-disciplinary family meet or Palliative Care meeting due by: 10/12   Noe Gens, NP-C [facility] Pgr: [phone] or if no answer [phone] 07/22/2016, 9:20 AM   Tolerating some pressure support, but still has increased RR.  Alert, follows simple commands.  B/l crackles.  HR regular.  Abd obese, soft.  2+ edema.  Rt BKA.  Ulcer on Lt lower leg.  PCO 80.3, pH 7.42, PO2 71.5.   Hb 10, WBC 7.5, Na 149, Creatinine 1.40  CXR - pulmonary edema pattern  Assessment/plan:  Acute on chronic respiratory failure. OSA/OHS. - pressure support wean >> not ready for extubation trial -  f/u CXR  Acute on chronic diastolic CHF. - negative fluid balance as tolerated  Fever with Coag negative Staph bacteremia. - f/u Echo - alter ABx due to resistance pattern for staph - repeat blood cx 10/10  Lt leg wounds. - wound  care - abx  Update pt's brother and niece at bedside.  CC time by me independent of APP time 33 minutes.  Chesley Mires, MD [facility] 07/22/2016, 11:47 AM Pager:  [phone] After 3pm call: [phone]

## 2016-07-22 NOTE — Clinical Social Work Note (Signed)
Clinical Social Work Assessment  Patient Details  Name: Heather Zimmerman MRN: [id number] Date of Birth: 01/01/1946  Date of referral:  07/22/16               Reason for consult:  Discharge Planning (admitted from SNF:  [facility])                Permission sought to share information with:  Case Manager, Customer service manager, Family Supports Permission granted to share information::     Name::        Agency::  [facility]  Relationship::  son:  Lorrene Reid Information:     Housing/Transportation Living arrangements for the past 2 months:  [place] of Information:  Medical Team, Adult Children, Facility Patient Interpreter Needed:  None Criminal Activity/Legal Involvement Pertinent to Current Situation/Hospitalization:  No - Comment as needed Significant Relationships:  Adult Children, Other Family Members, Community Support Lives with:  Facility Resident Do you feel safe going back to the place where you live?  Yes Need for family participation in patient care:  Yes (Comment) (patient currently on vent)  Care giving concerns:  Patient is currently on the vent and unable to complete assessment. LCSW made contact with facility and patient son, Mr. Charlesetta Garibaldi:  [phone]. Son reports plan will be to return to facility when patient is medically stable.  Reports he has also spoken to facility and they are in agreement.   Social Worker assessment / plan:  LCSW received consult that patient is admitted from SNF: [facility] as a long term resident. Reports plans to return per son when medically stable. LCSW educated son regarding role and services while in hospital. FL2 updated.  DC back to SNF once medically stable. Will follow acutely for emotional needs and disposition needs as well.  Employment status:  Retired Forensic scientist:  Medicare PT Recommendations:  Not assessed at this time [place] / Referral to community  resources:  [place]  Patient/Family's Response to care:  Son agreeable to plan  Patient/Family's Understanding of and Emotional Response to Diagnosis, Current Treatment, and Prognosis:  Patient unable to participate. Son has been available and coming to hospital for assistance in care of patient.  Emotional Assessment Appearance:  Appears stated age Attitude/Demeanor/Rapport:  Sedated, Unable to Assess (currently on vent) Affect (typically observed):  Unable to Assess Orientation:   (NA currently on vent, baseline she is alert/oriented) Alcohol / Substance use:    Psych involvement (Current and /or in the community):  No (Comment)  Discharge Needs  Concerns to be addressed:  No discharge needs identified Readmission within the last 30 days:  No Current discharge risk:  None Barriers to Discharge:  Continued Medical Work up   [place]illy [place], LCSW 07/22/2016, 1:08 PM

## 2016-07-22 NOTE — Progress Notes (Signed)
Nutrition Follow-up  DOCUMENTATION CODES:   Morbid obesity  INTERVENTION:  - Continue Vital High Protein @ 20 mL/hr with 30 mL Prostat x5/day. This regimen + kcal from Propofol provides 1162 kcal, 117 grams of protein, and 401 mL free water.  - PEPuP protocol. - Will order 50 mL free water QID (200 mL/day) - RD will follow-up 10/10.  NUTRITION DIAGNOSIS:   Inadequate oral intake related to inability to eat as evidenced by NPO status. -ongoing  GOAL:   Provide needs based on ASPEN/SCCM guidelines -met with current TF regimen.  MONITOR:   Vent status, TF tolerance, Weight trends, Labs, Skin, I & O's  ASSESSMENT:   70-y/o female with PMH significant for dCHF, T2DM, sleep apnea not on regular cpap, obesity hypoventilation syndrome on O2, PVD with chronic stasis ulcers and R BKA who presents from SNF with acute respiratory failure and lethargy. She was placed on bipap but continued to have increased WOB and decreased alertness so was intubated in the ED. Pulmonary edema on CXR.  10/9 Pt with OGT in place and is currently receiving TF at goal rate: Vital High Protein @ 20 mL/hr with 30 mL Prostat x5/day. This regimen is providing 980 kcal, 117 grams of protein, and 401 mL free water. CBW consistent with weight from yesterday and +0.6 kg since 10/7.  Patient is currently intubated on ventilator support MV: 7.1 L/min Temp (24hrs), Avg:100.4 F (38 C), Min:100 F (37.8 C), Max:101.1 F (38.4 C) Propofol: 6.9 ml/hr (182 kcal).  BP: 122/55 and MAP: 76  Medications reviewed; 40 mg IV Lasix/day, sliding scale Novolog.  Labs reviewed; CBGs: 140 and 141 mg/dL this AM, Na: 149 mmol/L, Cl: 97 mmol/L, BUN: 40 mg/dL, creatinine: 1.4 mg/dL, Ca: 8.8 mg/dL, GFR: 42 mL/min.   Drip: Propofol @ 8 mcg/kg/min.    10/8 - Patient awake on assessment.  - RD attempted to ask patient questions she could answer by shaking head yes or no.  - Discussed plan with RN. Plan is likely to extubate today or  tomorrow.  -ICU TF Protocol initiated 10/7. Vital HP running @ 40 ml/hr and pt ordered for 30 ml Pro-Stat BID.   Patient is currently intubated on ventilator support MV: 6 L/min Temp (24hrs), Avg:100 F (37.8 C), Max:100.5 F (38.1 C) Propofol: 11.2 ml/hr (provides 296 kcal daily) BP: 116/56 MAP: 76 Drips: D5-1/2NS @ 50 ml/hr (provides 60 grams dextrose, 204 kcal daily) Access: Pt with OG tube; terminates in stomach per chest x-ray  Nutrition-Focused physical exam completed. Findings are no fat depletion, no muscle depletion, and no edema. Patient with right BKA, left leg appears to have several wounds and left foot is very dark in color - only assessed this leg by looking.     Diet Order:  Diet NPO time specified  Skin:  Wound (see comment) (Open wounds left leg, Right BKA)  Last BM:  Unknown  Height:   Ht Readings from Last 1 Encounters:  07/20/16 '5\' 2"'$  (1.575 m)    Weight:   Wt Readings from Last 1 Encounters:  07/22/16 (!) 319 lb (144.7 kg)    Ideal Body Weight:  46.75 kg  BMI:  Body mass index is 58.35 kg/m.  Estimated Nutritional Needs:   Kcal:  1029-1167 (22-25 kcal/kg IBW)  Protein:  >/= 117 grams (>/= 2.5 g/kg IBW)  Fluid:  3.3 L/day (Obese Adult Method)  EDUCATION NEEDS:   No education needs identified at this time    Jarome Matin, MS, RD, LDN Inpatient  Clinical Dietitian Pager # [phone] After hours/weekend pager # [phone]

## 2016-07-23 ENCOUNTER — Inpatient Hospital Stay (HOSPITAL_COMMUNITY): Payer: Medicare Other

## 2016-07-23 DIAGNOSIS — J9622 Acute and chronic respiratory failure with hypercapnia: Secondary | ICD-10-CM

## 2016-07-23 DIAGNOSIS — J9621 Acute and chronic respiratory failure with hypoxia: Secondary | ICD-10-CM

## 2016-07-23 LAB — CBC WITH DIFFERENTIAL/PLATELET
Basophils Absolute: 0 10*3/uL (ref 0.0–0.1)
Basophils Relative: 0 %
EOS ABS: 0.4 10*3/uL (ref 0.0–0.7)
Eosinophils Relative: 5 %
HEMATOCRIT: 35.9 % — AB (ref 36.0–46.0)
HEMOGLOBIN: 10.1 g/dL — AB (ref 12.0–15.0)
LYMPHS ABS: 1.5 10*3/uL (ref 0.7–4.0)
LYMPHS PCT: 22 %
MCH: 26.2 pg (ref 26.0–34.0)
MCHC: 28.1 g/dL — AB (ref 30.0–36.0)
MCV: 93.2 fL (ref 78.0–100.0)
MONOS PCT: 12 %
Monocytes Absolute: 0.8 10*3/uL (ref 0.1–1.0)
NEUTROS PCT: 61 %
Neutro Abs: 4.1 10*3/uL (ref 1.7–7.7)
Platelets: 191 10*3/uL (ref 150–400)
RBC: 3.85 MIL/uL — AB (ref 3.87–5.11)
RDW: 19.1 % — ABNORMAL HIGH (ref 11.5–15.5)
WBC: 6.8 10*3/uL (ref 4.0–10.5)

## 2016-07-23 LAB — BASIC METABOLIC PANEL
Anion gap: 6 (ref 5–15)
BUN: 40 mg/dL — AB (ref 6–20)
CHLORIDE: 97 mmol/L — AB (ref 101–111)
CO2: 47 mmol/L — AB (ref 22–32)
CREATININE: 1.41 mg/dL — AB (ref 0.44–1.00)
Calcium: 9 mg/dL (ref 8.9–10.3)
GFR calc Af Amer: 42 mL/min — ABNORMAL LOW (ref >60)
GFR calc non Af Amer: 37 mL/min — ABNORMAL LOW (ref >60)
GLUCOSE: 159 mg/dL — AB (ref 65–99)
POTASSIUM: 3.7 mmol/L (ref 3.5–5.1)
SODIUM: 150 mmol/L — AB (ref 135–145)

## 2016-07-23 LAB — CULTURE, BLOOD (ROUTINE X 2)

## 2016-07-23 LAB — GLUCOSE, CAPILLARY
GLUCOSE-CAPILLARY: 182 mg/dL — AB (ref 65–99)
Glucose-Capillary: 157 mg/dL — ABNORMAL HIGH (ref 65–99)
Glucose-Capillary: 159 mg/dL — ABNORMAL HIGH (ref 65–99)
Glucose-Capillary: 168 mg/dL — ABNORMAL HIGH (ref 65–99)
Glucose-Capillary: 188 mg/dL — ABNORMAL HIGH (ref 65–99)
Glucose-Capillary: 221 mg/dL — ABNORMAL HIGH (ref 65–99)

## 2016-07-23 LAB — TRIGLYCERIDES: TRIGLYCERIDES: 229 mg/dL — AB (ref <150)

## 2016-07-23 LAB — URINE CULTURE: CULTURE: NO GROWTH

## 2016-07-23 MED ORDER — DULOXETINE HCL 20 MG PO CPEP
40.0000 mg | ORAL_CAPSULE | Freq: Every day | ORAL | Status: DC
  Filled 2016-07-23: qty 2

## 2016-07-23 MED ORDER — VANCOMYCIN HCL 10 G IV SOLR
1750.0000 mg | INTRAVENOUS | Status: DC
  Administered 2016-07-24: 1750 mg via INTRAVENOUS
  Filled 2016-07-23: qty 1750
  Filled 2016-07-23: qty 1000

## 2016-07-23 MED ORDER — MIDAZOLAM HCL 2 MG/2ML IJ SOLN: 1.0000 mg | INTRAMUSCULAR | Status: DC | PRN

## 2016-07-23 MED ORDER — VANCOMYCIN HCL 10 G IV SOLR
1750.0000 mg | INTRAVENOUS | Status: AC
  Administered 2016-07-23: 1750 mg via INTRAVENOUS
  Filled 2016-07-23: qty 1000

## 2016-07-23 MED ORDER — FENTANYL CITRATE (PF) 100 MCG/2ML IJ SOLN
50.0000 ug | INTRAMUSCULAR | Status: DC | PRN
  Administered 2016-07-23 – 2016-07-24 (×7): 50 ug via INTRAVENOUS
  Filled 2016-07-23 (×7): qty 2

## 2016-07-23 MED ORDER — FENTANYL CITRATE (PF) 100 MCG/2ML IJ SOLN: 50.0000 ug | INTRAMUSCULAR | Status: DC | PRN

## 2016-07-23 MED ORDER — MIDAZOLAM HCL 2 MG/2ML IJ SOLN
1.0000 mg | INTRAMUSCULAR | Status: DC | PRN
  Administered 2016-07-23 (×3): 1 mg via INTRAVENOUS
  Filled 2016-07-23 (×3): qty 2

## 2016-07-23 MED ORDER — VITAL HIGH PROTEIN PO LIQD
1000.0000 mL | ORAL | Status: DC
  Administered 2016-07-23: 23:00:00
  Administered 2016-07-23: 1000 mL
  Administered 2016-07-24
  Administered 2016-07-24: 1000 mL
  Administered 2016-07-24: 22:00:00
  Filled 2016-07-23 (×3): qty 1000

## 2016-07-23 MED ORDER — PRO-STAT SUGAR FREE PO LIQD
30.0000 mL | Freq: Three times a day (TID) | ORAL | Status: DC
  Administered 2016-07-23 – 2016-07-24 (×5): 30 mL
  Filled 2016-07-23 (×5): qty 30

## 2016-07-23 MED ORDER — INSULIN ASPART 100 UNIT/ML ~~LOC~~ SOLN
0.0000 [IU] | SUBCUTANEOUS | Status: DC
  Administered 2016-07-23: 5 [IU] via SUBCUTANEOUS
  Administered 2016-07-23 – 2016-07-24 (×3): 3 [IU] via SUBCUTANEOUS
  Administered 2016-07-24: 5 [IU] via SUBCUTANEOUS
  Administered 2016-07-24 – 2016-07-25 (×4): 3 [IU] via SUBCUTANEOUS
  Administered 2016-07-25: 5 [IU] via SUBCUTANEOUS
  Administered 2016-07-25: 3 [IU] via SUBCUTANEOUS
  Administered 2016-07-25: 5 [IU] via SUBCUTANEOUS
  Administered 2016-07-25 – 2016-07-26 (×5): 3 [IU] via SUBCUTANEOUS
  Administered 2016-07-26: 5 [IU] via SUBCUTANEOUS
  Administered 2016-07-26: 3 [IU] via SUBCUTANEOUS
  Administered 2016-07-27 (×2): 5 [IU] via SUBCUTANEOUS
  Administered 2016-07-27 – 2016-07-28 (×7): 3 [IU] via SUBCUTANEOUS
  Administered 2016-07-28 (×2): 2 [IU] via SUBCUTANEOUS
  Administered 2016-07-28: 3 [IU] via SUBCUTANEOUS
  Administered 2016-07-29 (×3): 2 [IU] via SUBCUTANEOUS
  Administered 2016-07-29 (×2): 3 [IU] via SUBCUTANEOUS
  Administered 2016-07-30 – 2016-07-31 (×3): 2 [IU] via SUBCUTANEOUS
  Administered 2016-07-31 – 2016-08-01 (×2): 3 [IU] via SUBCUTANEOUS
  Administered 2016-08-01: 5 [IU] via SUBCUTANEOUS
  Administered 2016-08-01: 2 [IU] via SUBCUTANEOUS

## 2016-07-23 NOTE — Progress Notes (Signed)
Pharmacy Antibiotic Note  Heather Zimmerman. Heather Zimmerman is a 70 y.o. female admitted on 07/19/2016 with AMS, SOB. Blood cx from 10/6 positive for 2/2 with CoNS, which is now showing methicillin resistance.    Today, 07/23/2016  Renal : CKD, CrCl (Normalized) ~ 41 ml/min/1.30m2  WBC WNL  Still spiking fevers, Tmax 100.6  Plan:  Adjust Vancomycin to 1750mg  IV q24h for current weight/renal function.  Plan for Vancomycin trough level at steady state. Goal trough level 15-20 mcg/mL.  Follow renal function, repeat blood cultures, clinical course.   Height: 5\' 2"  (157.5 cm) Weight: (!) 329 lb (149.2 kg) IBW/kg (Calculated) : 50.1  Temp (24hrs), Avg:100.1 F (37.8 C), Min:99.7 F (37.6 C), Max:100.6 F (38.1 C)   Recent Labs Lab 07/19/16 2320 07/20/16 0006 07/20/16 0310 07/21/16 0349 07/22/16 0400 07/23/16 0418  WBC 9.8  --  7.0 7.0 7.5 6.8  CREATININE 1.69*  --  1.66* 1.54* 1.40* 1.41*  LATICACIDVEN  --  1.16  --   --   --   --     Estimated Creatinine Clearance: 51.8 mL/min (by C-G formula based on SCr of 1.41 mg/dL (H)).    Allergies  Allergen Reactions  . Ace Inhibitors     unknown    Antimicrobials this admission: 10/7 >> Ceftriaxone x 1, resume 10/8 >>10/9 10/7 >> Ceftazidime >>10/8 10/9 >> Vanc>>  Microbiology results: 10/6 BCx: 2/2 CoNS - sens Cipro, Gent, Rifampin, Vanc (BCID staph species with no methicillin resistance, but Blood Cx R-oxacillin) 10/7 UCx: multiple, suggest recollection 10/7 MRSA PCR: positive 10/9 UCx (repeat): negative 10/10 BCx (repeat): sent   Thank you for allowing pharmacy to be a part of this patient's care.   Lindell Spar, PharmD, BCPS Pager: [phone] 07/23/2016 10:15 AM

## 2016-07-23 NOTE — Consult Note (Signed)
Reason for Consult:Chronic venous stasis ulceration to left lower leg. Right BKA.  Acute respiratory failure.  Seen yesterday and wounds were dry.  Noted as weeping today.  Will switch topical recommendations to Xeroform gauze.  Wound type:Chronic venous stasis Pressure Ulcer POA: N/A Measurement:22 cm x 20 cm x 0.1 cm circumferentially.  0.5 cm scabbed lesion present to anterior lower leg.  Legs are weeping today.  Due to respiratory failure, will not apply compression today. Will keep legs moist to promote healing and monitor.  Wound [id number] and moist Drainage (amount, consistency, odor) None noted.  No edema.  No odor.  Periwound:Scarring from previous healed ulcers Dressing procedure/placement/frequency:Cleanse left lower leg with soap and water.  Apply Xeroform gauze to nonintact skin.  Wrap with kerlix and secure with tape.  Change daily. Will not follow at this time.  Please re-consult if needed.  Domenic Moras RN BSN [place] Pager [phone]

## 2016-07-23 NOTE — Progress Notes (Signed)
PULMONARY / CRITICAL CARE MEDICINE   Name: Heather Zimmerman MRN: [id number] DOB: 06/04/1946    ADMISSION DATE:  07/19/2016 CONSULTATION DATE:  07/20/2016  REFERRING MD:  Dr. Joseph Berkshire  CHIEF COMPLAINT:  AMS, SOB   BRIEF SUMMARY: 70-y/o female with PMH significant for dCHF, T2DM, sleep apnea not on regular cpap, obesity hypoventilation syndrome on O2, PVD with chronic stasis ulcers and R BKA who presents from SNF with acute respiratory failure and lethargy. Duration of symptoms unknown. O2 sats 63% by EMS report but improved to 99% on non-rebreather. Per ED, initial GCS was 8, and patient was tachypneic with accessory muscle use. Per ED nurse, she initially was oriented at least to person and month but speech quickly began not to make sense. She was placed on bipap but continued to have increased WOB and decreased alertness so was intubated in the ED. Glucose was low at 52, so 1 amp of D50 given. CXR with evidence of pulmonary edema. 40 mg IV lasix given. No elevated LA or leukocytosis to suggest infection. ABG showed severe compensated respiratory acidosis with pH 7.3, pCO2 106, pO2 77.2, bicarb 50.6 (appears to have been obtained after intubation). BNP 51.1, slightly elevated from previous (39.7). SCr at baseline.   Of note, patient's insulin regimen had been increased significantly over the summer, from lantus 26 U daily with 5-10 U novolog with meals to lantus 42 U nightly, 26 U novolog with meals. Had ED visit 07/07/16 for episode of AMS and hypoglycemia to 46 that improved after administration of D50. She has had recent hospitalizations during which IV diuresis has improved respiratory status. Home meds include 20 mg torsemide.   SUBJECTIVE:  RN reports no acute events overnight.  Triglycerides elevated on propofol. Tmax [id number], BP [id number] systolic.  RT reports pt did not tolerate PSV wean due to tachypnea / low volumes.     VITAL SIGNS: BP (!) 98/41   Pulse 81   Temp 100.2 F (37.9  C)   Resp 17   Ht 5\' 2"  (1.575 m)   Wt (!) 329 lb (149.2 kg)   LMP  (LMP Unknown)   SpO2 96%   BMI 60.17 kg/m   HEMODYNAMICS:    VENTILATOR SETTINGS: Vent Mode: PRVC FiO2 (%):  [30 %] 30 % Set Rate:  [18 bmp] 18 bmp Vt Set:  [350 mL] 350 mL PEEP:  [5 cmH20] 5 cmH20 Pressure Support:  [5 cmH20-10 cmH20] 10 cmH20 Plateau Pressure:  [23 cmH20-28 cmH20] 27 cmH20  INTAKE / OUTPUT: I/O last 3 completed shifts: In: 2398.3 [I.V.:638.3; Other:130; NG/GT:1080; IV Piggyback:550] Out: [id number] [Urine:3530]  PHYSICAL EXAMINATION: General:  Morbidly obese female, intubated, lying in bed Neuro:  Awake, alert, MAE, generalized weakness, follows simple commands HEENT: ETT in place, mm pink/moist, unable to appreciate JVD Cardiovascular: RRR, S1, S2, no m/r/g Lungs: non-labored, lungs bilaterally diminished  Abdomen: Obese, soft, +BS.  Musculoskeletal: R BKA.  Skin: Open weeping ulcers of L shin. Moist skin of pannus but no skin breakdown or discoloration.   LABS:  PULMONARY  Recent Labs Lab 07/20/16 0250 07/20/16 0517 07/20/16 0915 07/21/16 0335  PHART 7.300* 7.545*  --  7.421  PCO2ART 106* 63.5*  --  80.3*  PO2ART 77.2* 88.0  --  71.5*  HCO3 50.6* 54.8* 50.9* 50.6*  TCO2  --  >50  --   --   O2SAT 94.3 97.0 78.2 93.0    CBC  Recent Labs Lab 07/21/16 0349 07/22/16 0400 07/23/16 0418  HGB 10.1*  10.0* 10.1*  HCT 36.5 36.0 35.9*  WBC 7.0 7.5 6.8  PLT 185 191 191    COAGULATION No results for input(s): INR in the last 168 hours.  CARDIAC    Recent Labs Lab 07/20/16 1017 07/20/16 1430 07/21/16 0349  TROPONINI 0.05* 0.05* 0.04*   No results for input(s): PROBNP in the last 168 hours.   CHEMISTRY  Recent Labs Lab 07/19/16 2320 07/20/16 0310 07/21/16 0349 07/22/16 0400 07/23/16 0418  NA 140 145 145 149* 150*  K 3.9 4.1 3.6 3.8 3.7  CL 88* 94* 94* 97* 97*  CO2 45* 45* 45* 49* 47*  GLUCOSE 52* 122* 169* 147* 159*  BUN 48* 45* 47* 40* 40*  CREATININE  1.69* 1.66* 1.54* 1.40* 1.41*  CALCIUM 8.8* 8.1* 8.2* 8.8* 9.0  MG  --  2.4  --   --   --   PHOS  --  3.5  --   --   --    Estimated Creatinine Clearance: 51.8 mL/min (by C-G formula based on SCr of 1.41 mg/dL (H)).   LIVER  Recent Labs Lab 07/19/16 2320  AST 19  ALT 16  ALKPHOS 71  BILITOT 0.6  PROT 8.1  ALBUMIN 2.9*     INFECTIOUS  Recent Labs Lab 07/20/16 0006 07/20/16 1017 07/21/16 0349 07/22/16 0400  LATICACIDVEN 1.16  --   --   --   PROCALCITON  --  <0.10 <0.10 <0.10    ENDOCRINE CBG (last 3)   Recent Labs  07/22/16 2300 07/23/16 0353 07/23/16 0754  GLUCAP 180* 157* 159*     IMAGING x48h  - image(s) personally visualized  -   highlighted in bold Dg Chest Port 1 View  Result Date: 07/23/2016 CLINICAL DATA:  70 year old female with respiratory failure. Subsequent encounter. EXAM: PORTABLE CHEST 1 VIEW COMPARISON:  07/22/2016. FINDINGS: Chin overlies the upper lung. Endotracheal tube tip appears to be 1.6 cm above the carina. Nasogastric tube curled within the stomach with the tip at the level of the gastric fundus. Cardiomegaly. Calcified tortuous aorta. Pulmonary vascular congestion/ pulmonary edema similar to prior exam. Poor inspiration without evidence gross pneumothorax. IMPRESSION: Endotracheal tube tip 1.6 cm above the carina. Poor inspiration with pulmonary vascular congestion/ pulmonary edema appearing similar to prior exam. Cardiomegaly. Aortic atherosclerosis. Electronically Signed   By: Genia Del M.D.   On: 07/23/2016 07:35   Dg Chest Port 1 View  Result Date: 07/22/2016 CLINICAL DATA:  Endotracheal tube placement, CHF EXAM: PORTABLE CHEST 1 VIEW COMPARISON:  07/21/2016 FINDINGS: Endotracheal tube with the tip 2.2 cm above the carina. Nasogastric tube coursing below the diaphragm not well visualized. Right sided PICC line with the tip projecting over the SVC. Stable cardiomegaly. Bilateral interstitial thickening and enlargement of the central  pulmonary vasculature. No right pleural effusion. Small left pleural effusion. No pneumothorax. No acute osseous abnormality. IMPRESSION: 1. Findings consistent with mild pulmonary edema. 2. Support lines and tubing in unchanged position. Electronically Signed   By: Kathreen Devoid   On: 07/22/2016 07:14    STUDIES:  DG Chest 10/6 > Hypoexpanded lungs. Fluffy central airspace opacification suggestive of pulmonary edema. CT head 9/24 > No acute changes, intracranial mass or hemorrhage.   ECHO 12/21/15 > LVEF 60-65% with LVH and dilation of LA. PA pressure 29.  ECHO 10/9 >> LVEF 60-65%, no RWMA, grade 1 diastolic dysfunction, PAS within normal range  CULTURES: Blood cx x2 10/6 >> coag neg staph 2/2 bottles >> R - oxaxillin, S- vanco Urine  cx 10/6 >> multiple species, recollection suggested UC 10/9 >> neg BCx2 10/10 >>   ANTIBIOTICS: Ceftriaxone x 1 10/7 > 10/7, 10/8 (coag neg staph) >> 10/9 Ceftaz 07/20/16 > 10/8 Vancomycin 10/9 (coag neg staph, oxacillin resistant) >>   LINES/TUBES: ETT 10/6 >> Foley cath 10/6 >> RUE PICC 10/7 >>  SIGNIFICANT EVENTS: 10/06  Admitted with acute respiratory failure.  10/07  Patient waking up to exam, on fent gtt. RN describes pus like urine 10/08  SBT but needing diprivan due to tachypnea. Pharmacy says true + coag neg staph. ETT at carina and retracted.  10/09  Mild fever persists, blood cultures positive for coag neg staph 2/2, sensitive to vanco  DISCUSSION: 70-y/o female with dCHF, obesity hypoventilation syndrome, T2DM, and PVD who presents with acute on chronic respiratory failure, requiring intubation for decreased alertness and increased WOB. CO2 45, increased from baseline ~ 35, suggesting worsening chronic CO2 retention. Not adherent to nightly bipap per available records. CXR consistent with pulmonary edema. Hypoglycemia likely also contributing to decreased level of alertness. Course notable for coag neg staph bacteremia.     ASSESSMENT /  PLAN:  PULMONARY A: Acute on chronic hypercarbic respiratory failure Obesity hypoventilation syndrome with acute decompensation Pulmonary edema on CXR P:   PRVC 8 cc/kg  Wean PEEP / FiO2 for sats >92% Intermittent CXR Baseline pCO2 ~80 PSV as tolerated Diuresis as renal function / BP permit  CARDIOVASCULAR A:  dCHF -  with concern for exacerbation PVD Mild Elevation in Troponin - likely demand ischemia P:  Repeat ECHO as above, no mention of vegetation Lasix 40 mg IV daily  ICU monitoring of hemodynamics  RENAL A:   Impaired renal function, at baseline (SCr ~1.7) P:   Monitor BMP / UOP  NS @ KVO Replace electrolytes as indicated  GASTROINTESTINAL A:   Morbid Obesity  P:   Pepcid for SUP  TF per nutrition   HEMATOLOGIC A:   Mild anemia, likely dilutional  P:  Monitor with daily CBCs Heparin for SQ DVT prophylaxis   INFECTIOUS A:   Venous stasis ulcers L shin COAG Neg Staph Bacteremia - note both bottles with staph P:   Continue vancomycin as above Repeat blood cultures in am 10/10  Wound Consult for LLE wounds / panus Trend PCT   ENDOCRINE A:   T2DM - poorly controlled with hgb A1c of 10.1 Hypoglycemia, resolved (2nd reported episode); brittle? P:   ICU hyperglycemia SSI Monitor for hypoglycemia  NEUROLOGIC A:   Acute encephalopathy - in setting of hypercapnia, hypoglycemia, resolving Depression  Chronic Pain P:   Daily WUA / SBT PRN fentanyl for pain  Discontinue diprivan gtt with rising triglycerides RASS goal: 0 Hold home gabapentin  Resume home cymbalta  FAMILY  - Updates: No family at bedside 10/9  - Inter-disciplinary family meet or Palliative Care meeting due by: 10/12   Noe Gens, NP-C [facility] Pgr: [phone] or if no answer [phone] 07/23/2016, 9:07 AM  Not able to tolerate pressure support >> low Vt, increased RR.  Denies chest/abd pain.  Alert.  Basilar crackles.  HR regular, no murmur.   Abd soft.  Lt lower leg dressing clean.  CXR - decreased lung volumes  Hb 10.1, WBC 6.8, Na 150, Creatinine 1.41  Assessment/plan:  Acute on chronic respiratory failure. OSA/OHS. - pressure support wean >> not ready for extubation trial; concerned she might need trach - f/u CXR  Acute on chronic diastolic CHF. - negative fluid  balance as tolerated >> monitor Na while getting lasix  Fever with Coag negative Staph bacteremia. - continue vancomycin - repeat blood cx 10/10  Lt leg wounds. - wound care - abx  CC time by me independent of APP time 31 minutes.  Chesley Mires, MD [facility] Pulmonary/Critical Care 07/23/2016, 10:01 AM Pager:  [phone] After 3pm call: [phone]

## 2016-07-23 NOTE — Progress Notes (Signed)
eLink Physician-Brief Progress Note Patient Name: Heather Zimmerman DOB: [birth year] MRN: [id number]   Date of Service  07/23/2016  HPI/Events of Note  Hyperglycemia   eICU Interventions  Restarted SSI     Intervention Category Intermediate Interventions: Hyperglycemia - evaluation and treatment  [name] S. 07/23/2016, 8:17 PM

## 2016-07-23 NOTE — Progress Notes (Signed)
Nutrition Follow-up  DOCUMENTATION CODES:   Morbid obesity  INTERVENTION:   - Will change TF regimen: Vital High Protein at 35 mL/hr and Pro-stat TID to provide 1140 kcal (100% estimated energy needs), 119 grams protein (100% estimated protein needs), and 706 mL free water. - PEPuP protocol. - Continue 50 mL free water QID (200 mL/day) - RD will follow-up 10/11.  NUTRITION DIAGNOSIS:   Inadequate oral intake related to inability to eat as evidenced by NPO status.  Ongoing.  GOAL:   Provide needs based on ASPEN/SCCM guidelines  Met via current TF regimen.  MONITOR:   Vent status, TF tolerance, Weight trends, Labs, Skin, I & O's, GOC  ASSESSMENT:   70-y/o female with PMH significant for dCHF, T2DM, sleep apnea not on regular cpap, obesity hypoventilation syndrome on O2, PVD with chronic stasis ulcers and R BKA who presents from SNF with acute respiratory failure and lethargy. She was placed on bipap but continued to have increased WOB and decreased alertness so was intubated in the ED. Pulmonary edema on CXR.  07/23/16 Vital High Protein @ 20 mL/hr infusing through OGT at time of visit. Pump programmed with free water flushes of 50 mL every 6 hours.  Per RN, propofol drip off at time of visit for wake-up assessment. Re-initiation of propofol dependent on pt weaning.  Wt +4.5 kg since yesterday.  Will change TF regimen to Vital High Protein @ 35 mL/hr with Pro-stat TID.  Pt is currently intubated on ventilator support. MV: 8.0 L/min Max temperature in 24 hours: 38.1 degrees Celsius BP: 99/31 MAP: 52  Drips: Propofol off at time of visit.  Medications reviewed and include 40 mg IV Lasix daily, 5,000 units IV heparin every 8 hours, sliding scale Novolog Labs reviewed and include elevated sodium (150 mmol/L), low chloride (97 mmol/L), elevated BUN (40 mg/dL), elevated creatinine (1.41 mg/dL), elevated triglycerides (229 mg/dL) CBGs: 157-182 mg/dL    07/22/16 - Pt  with OGT in place and is currently receiving TF at goal rate of Vital High Protein @ 20 mL/hr with 30 mL Prostat x5/day - This regimen is providing 980 kcal, 117 grams of protein, and 401 mL free water.  Patient is currently intubated on ventilator support MV: 7.1 L/min Temp (24hrs), Avg:100.4 F (38 C), Min:100 F (37.8 C), Max:101.1 F (38.4 C) Propofol: 6.9 ml/hr (182 kcal).  BP: 122/55 and MAP: 76 Drip: Propofol @ 8 mcg/kg/min.    07/21/16 - Patient awake on assessment.  - RD attempted to ask patient questions she could answer by shaking head yes or no.  - Discussed plan with RN. Plan is likely to extubate today or tomorrow.  - ICU TF Protocol initiated 10/7. Vital HP running @ 40 ml/hr and pt ordered for 30 ml Pro-Stat BID.   Patient is currently intubated on ventilator support MV: 6L/min Temp (24hrs), Avg:100 F (37.8 C), Max:100.5 F (38.1 C) Propofol:11.27m/hr (provides 296 kcal daily) BP:116/56 MAP:76 Drip: D5-1/2NS @ 50 ml/hr (provides 60 grams dextrose, 204 kcal daily) Access: Pt with OG tube; terminates in stomach per chest x-ray  Nutrition-Focused physical exam completed. Findings are nofat depletion, nomuscle depletion, and noedema. Patient with right BKA, left leg appears to have several wounds and left foot is very dark in color - only assessed this leg by looking.   Diet Order:  Diet NPO time specified  Skin:  Wound (see comment) (Open wounds left leg, Right BKA)  Last BM:  Unknown  Height:   Ht Readings from Last  1 Encounters:  07/20/16 '5\' 2"'$  (1.575 m)    Weight:   Wt Readings from Last 1 Encounters:  07/23/16 (!) 329 lb (149.2 kg)    Ideal Body Weight:  46.75 kg  BMI:  Body mass index is 60.17 kg/m.  Estimated Nutritional Needs:   Kcal:  1029-1167 (22-25 kcal/kg IBW)  Protein:  >/= 117 grams (>/= 2.5 g/kg IBW)  Fluid:  1.7-1.9 L/day   EDUCATION NEEDS:   No education needs identified at this time  Jeb Levering Dietetic Intern Pager Number: [phone]

## 2016-07-24 ENCOUNTER — Inpatient Hospital Stay (HOSPITAL_COMMUNITY): Payer: Medicare Other

## 2016-07-24 LAB — GLUCOSE, CAPILLARY
GLUCOSE-CAPILLARY: 186 mg/dL — AB (ref 65–99)
GLUCOSE-CAPILLARY: 217 mg/dL — AB (ref 65–99)
GLUCOSE-CAPILLARY: 170 mg/dL — AB (ref 65–99)
Glucose-Capillary: 189 mg/dL — ABNORMAL HIGH (ref 65–99)
Glucose-Capillary: 198 mg/dL — ABNORMAL HIGH (ref 65–99)

## 2016-07-24 LAB — CBC
HEMATOCRIT: 39.2 % (ref 36.0–46.0)
HEMOGLOBIN: 10.7 g/dL — AB (ref 12.0–15.0)
MCH: 26.4 pg (ref 26.0–34.0)
MCHC: 27.3 g/dL — ABNORMAL LOW (ref 30.0–36.0)
MCV: 96.6 fL (ref 78.0–100.0)
Platelets: 197 10*3/uL (ref 150–400)
RBC: 4.06 MIL/uL (ref 3.87–5.11)
RDW: 19.1 % — ABNORMAL HIGH (ref 11.5–15.5)
WBC: 7.2 10*3/uL (ref 4.0–10.5)

## 2016-07-24 LAB — BASIC METABOLIC PANEL
ANION GAP: 7 (ref 5–15)
BUN: 38 mg/dL — ABNORMAL HIGH (ref 6–20)
CALCIUM: 9.3 mg/dL (ref 8.9–10.3)
CO2: 45 mmol/L — AB (ref 22–32)
Chloride: 100 mmol/L — ABNORMAL LOW (ref 101–111)
Creatinine, Ser: 1.27 mg/dL — ABNORMAL HIGH (ref 0.44–1.00)
GFR, EST AFRICAN AMERICAN: 48 mL/min — AB (ref >60)
GFR, EST NON AFRICAN AMERICAN: 41 mL/min — AB (ref >60)
GLUCOSE: 192 mg/dL — AB (ref 65–99)
POTASSIUM: 3.6 mmol/L (ref 3.5–5.1)
Sodium: 152 mmol/L — ABNORMAL HIGH (ref 135–145)

## 2016-07-24 MED ORDER — FENTANYL BOLUS VIA INFUSION
25.0000 ug | INTRAVENOUS | Status: DC | PRN
  Filled 2016-07-24: qty 25

## 2016-07-24 MED ORDER — LIP MEDEX EX OINT
TOPICAL_OINTMENT | CUTANEOUS | Status: DC | PRN
  Filled 2016-07-24: qty 7

## 2016-07-24 MED ORDER — POTASSIUM CHLORIDE 20 MEQ/15ML (10%) PO SOLN
20.0000 meq | Freq: Once | ORAL | Status: AC
  Administered 2016-07-24: 20 meq

## 2016-07-24 MED ORDER — FENTANYL CITRATE (PF) 100 MCG/2ML IJ SOLN: 50.0000 ug | Freq: Once | INTRAMUSCULAR | Status: DC

## 2016-07-24 MED ORDER — SODIUM CHLORIDE 0.9 % IV SOLN
25.0000 ug/h | INTRAVENOUS | Status: DC
  Administered 2016-07-24: 25 ug/h via INTRAVENOUS
  Filled 2016-07-24: qty 50

## 2016-07-24 MED ORDER — FREE WATER
200.0000 mL | Freq: Four times a day (QID) | Status: DC
Start: 2016-07-24 — End: 2016-07-25
  Administered 2016-07-24 – 2016-07-25 (×4): 200 mL

## 2016-07-24 NOTE — Progress Notes (Signed)
Nutrition Follow-up  DOCUMENTATION CODES:   Morbid obesity  INTERVENTION:   - Continue TF regimen of Vital High Protein @ 35 mL/hr with Pro-stat TID. This regimen provides 1140 kcal, 119 grams protein, and 706 mL free water. - PEPuP protocol. - Continue free water flushes per MD/NP. Current order for 200 mL Q6. - RD will follow-up on 10/12.  NUTRITION DIAGNOSIS:   Inadequate oral intake related to inability to eat as evidenced by NPO status.  Ongoing.  GOAL:   Provide needs based on ASPEN/SCCM guidelines  Met with current TF regimen.  MONITOR:   Vent status, TF tolerance, Weight trends, Labs, Skin, I & O's  ASSESSMENT:   70-y/o female with PMH significant for dCHF, T2DM, sleep apnea not on regular cpap, obesity hypoventilation syndrome on O2, PVD with chronic stasis ulcers and R BKA who presents from SNF with acute respiratory failure and lethargy. She was placed on bipap but continued to have increased WOB and decreased alertness so was intubated in the ED. Pulmonary edema on CXR.  07/24/16 Vital High Protein infusing @ 35 mL/hr (goal rate) at time of visit. Pump programmed with free water flushes of 50 mL QID.  Weight -6.3 kg since yesterday. Estimated energy and protein needs remain the same. Continue current TF regimen.  Per NP note, increase free water flushes to 200 mL Q6.  Pt is currently intubated on ventilator support. MV: 6.5 L/min Max temperature in 24 hours: 38.1 degrees Celsius BP: 123/49 and MAP: 73  Drips: None at this time.  Medications reviewed and include 40 mg IV Lasix daily, 5,000 units IV heparin every 8 hrs, sliding scale Novolog  Labs reviewed and include elevated sodium (152 mmol/L), low chloride (100 mmol/L), elevated BUN (38 mg/dL), elevated creatinine (1.27 mg/dL) CBGs: 186, 217 mg/dL     07/23/16 - Vital High Protein @ 20 mL/hr infusing through OGT. - Free water flushes of 50 mL every 6 hours. - Propofol drip off for wake-up  assessment. - Change TF regimen to Vital High Protein @ 35 mL/hr with Pro-stat TID. - This regimen provides 1140 kcal, 119 grams protein, and 706 mL free water.  Pt is currently intubated on ventilator support. MV: 8.0 L/min Max temperature in 24 hours: 38.1 degrees Celsius BP: 99/31 and MAP: 52 Drips: Propofol off at time of visit.     07/22/16 - Pt with OGT in place and is currently receiving TF at goal rate of Vital High Protein @ 20 mL/hr with 30 mL Prostat x5/day - This regimen is providing 980 kcal, 117 grams of protein, and 401 mL free water.  Patient is currently intubated on ventilator support MV: 7.1L/min Temp (24hrs), Avg:100.4 F (38 C), Min:100 F (37.8 C), Max:101.1 F (38.4 C) Propofol: 6.39m/hr (182 kcal).  BP: 122/55 and MAP: 76 Drip: Propofol @ 8 mcg/kg/min.  Diet Order:  Diet NPO time specified  Skin:  Wound (see comment) (Open wounds left leg, Right BKA)  Last BM:  07/23/16  Height:   Ht Readings from Last 1 Encounters:  07/20/16 _0  (1.575 m)    Weight:   Wt Readings from Last 1 Encounters:  07/24/16 (!) 315 lb (142.9 kg)    Ideal Body Weight:  46.75 kg  BMI:  Body mass index is 57.61 kg/m.  Estimated Nutritional Needs:   Kcal:  1029-1167 (22-25 kcal/kg IBW)  Protein:  >/= 117 grams (>/= 2.5 g/kg IBW)  Fluid:  1.7-1.9 L/day  EDUCATION NEEDS:   No education needs identified  at this time  Jeb Levering Dietetic Intern Pager Number: [phone]

## 2016-07-24 NOTE — Progress Notes (Signed)
PULMONARY / CRITICAL CARE MEDICINE   Name: Heather Zimmerman MRN: [id number] DOB: Sep 11, 1946    ADMISSION DATE:  07/19/2016 CONSULTATION DATE:  07/20/2016  REFERRING MD:  Dr. Joseph Berkshire  CHIEF COMPLAINT:  AMS, SOB   BRIEF SUMMARY: 70-y/o female with PMH significant for dCHF, T2DM, sleep apnea not on regular cpap, obesity hypoventilation syndrome on O2, PVD with chronic stasis ulcers and R BKA who presents from SNF with acute respiratory failure and lethargy. Duration of symptoms unknown. O2 sats 63% by EMS report but improved to 99% on non-rebreather. Per ED, initial GCS was 8, and patient was tachypneic with accessory muscle use. Per ED nurse, she initially was oriented at least to person and month but speech quickly began not to make sense. She was placed on bipap but continued to have increased WOB and decreased alertness so was intubated in the ED. Glucose was low at 52, so 1 amp of D50 given. CXR with evidence of pulmonary edema. 40 mg IV lasix given. No elevated LA or leukocytosis to suggest infection. ABG showed severe compensated respiratory acidosis with pH 7.3, pCO2 106, pO2 77.2, bicarb 50.6 (appears to have been obtained after intubation). BNP 51.1, slightly elevated from previous (39.7). SCr at baseline.   Of note, patient's insulin regimen had been increased significantly over the summer, from lantus 26 U daily with 5-10 U novolog with meals to lantus 42 U nightly, 26 U novolog with meals. Had ED visit 07/07/16 for episode of AMS and hypoglycemia to 46 that improved after administration of D50. She has had recent hospitalizations during which IV diuresis has improved respiratory status. Home meds include 20 mg torsemide.   SUBJECTIVE:  RN reports pt failed SBT this am with tachypnea / increased WOB.  Tolerating PRN sedation.  Na rising.  Tmax 100.0, net neg 1.1L in last 24 hours   VITAL SIGNS: BP (!) 141/60 (BP Location: Left Arm)   Pulse 80   Temp 100 F (37.8 C) (Core  (Comment))   Resp (!) 21   Ht 5\' 2"  (1.575 m)   Wt (!) 315 lb (142.9 kg)   LMP  (LMP Unknown)   SpO2 96%   BMI 57.61 kg/m   HEMODYNAMICS:    VENTILATOR SETTINGS: Vent Mode: PSV;CPAP FiO2 (%):  [30 %] 30 % Set Rate:  [18 bmp] 18 bmp Vt Set:  [350 mL] 350 mL PEEP:  [5 cmH20] 5 cmH20 Pressure Support:  [5 cmH20-8 cmH20] 8 cmH20 Plateau Pressure:  [22 cmH20-27 cmH20] 27 cmH20  INTAKE / OUTPUT: I/O last 3 completed shifts: In: 2352.6 [I.V.:472.6; NG/GT:1380; IV Piggyback:500] Out: [id number] [Urine:3575]  PHYSICAL EXAMINATION: General:  Morbidly obese female, intubated, lying in bed in NAD Neuro:  Awake, alert, MAE, generalized weakness, follows simple commands HEENT: ETT in place, mm pink/moist, unable to appreciate JVD Cardiovascular: RRR, S1, S2, no m/r/g Lungs: non-labored, lungs bilaterally diminished  Abdomen: Obese, soft, +BS.  Musculoskeletal: R BKA.  Skin: Left lower extremity dressing c/d/i (stasis ulcers)  LABS:  PULMONARY  Recent Labs Lab 07/20/16 0250 07/20/16 0517 07/20/16 0915 07/21/16 0335  PHART 7.300* 7.545*  --  7.421  PCO2ART 106* 63.5*  --  80.3*  PO2ART 77.2* 88.0  --  71.5*  HCO3 50.6* 54.8* 50.9* 50.6*  TCO2  --  >50  --   --   O2SAT 94.3 97.0 78.2 93.0    CBC  Recent Labs Lab 07/22/16 0400 07/23/16 0418 07/24/16 0427  HGB 10.0* 10.1* 10.7*  HCT 36.0 35.9*  39.2  WBC 7.5 6.8 7.2  PLT 191 191 197    COAGULATION No results for input(s): INR in the last 168 hours.  CARDIAC    Recent Labs Lab 07/20/16 1017 07/20/16 1430 07/21/16 0349  TROPONINI 0.05* 0.05* 0.04*   No results for input(s): PROBNP in the last 168 hours.   CHEMISTRY  Recent Labs Lab 07/20/16 0310 07/21/16 0349 07/22/16 0400 07/23/16 0418 07/24/16 0427  NA 145 145 149* 150* 152*  K 4.1 3.6 3.8 3.7 3.6  CL 94* 94* 97* 97* 100*  CO2 45* 45* 49* 47* 45*  GLUCOSE 122* 169* 147* 159* 192*  BUN 45* 47* 40* 40* 38*  CREATININE 1.66* 1.54* 1.40* 1.41* 1.27*   CALCIUM 8.1* 8.2* 8.8* 9.0 9.3  MG 2.4  --   --   --   --   PHOS 3.5  --   --   --   --    Estimated Creatinine Clearance: 55.9 mL/min (by C-G formula based on SCr of 1.27 mg/dL (H)).   LIVER  Recent Labs Lab 07/19/16 2320  AST 19  ALT 16  ALKPHOS 71  BILITOT 0.6  PROT 8.1  ALBUMIN 2.9*     INFECTIOUS  Recent Labs Lab 07/20/16 0006 07/20/16 1017 07/21/16 0349 07/22/16 0400  LATICACIDVEN 1.16  --   --   --   PROCALCITON  --  <0.10 <0.10 <0.10    ENDOCRINE CBG (last 3)   Recent Labs  07/23/16 2350 07/24/16 0424 07/24/16 0755  GLUCAP 188* 186* 217*     IMAGING x48h  - image(s) personally visualized  -   highlighted in bold Dg Chest Port 1 View  Result Date: 07/24/2016 CLINICAL DATA:  Hypoxia EXAM: PORTABLE CHEST 1 VIEW COMPARISON:  July 23, 2016 FINDINGS: Endotracheal tube tip is 4.0 cm above the carina. Central catheter tip is in the superior vena cava. Nasogastric tube tip and side port are below the diaphragm. No pneumothorax. There is stable mild generalized interstitial pulmonary edema. There is no airspace consolidation. There is cardiomegaly with pulmonary venous hypertension. There is aortic atherosclerosis. No adenopathy. IMPRESSION: Tube and catheter positions as described without pneumothorax. Evidence of a degree of congestive heart failure, stable. No new opacity. Aortic atherosclerosis noted. Electronically Signed   By: Lowella Grip III M.D.   On: 07/24/2016 07:57   Dg Chest Port 1 View  Result Date: 07/23/2016 CLINICAL DATA:  70 year old female with respiratory failure. Subsequent encounter. EXAM: PORTABLE CHEST 1 VIEW COMPARISON:  07/22/2016. FINDINGS: Chin overlies the upper lung. Endotracheal tube tip appears to be 1.6 cm above the carina. Nasogastric tube curled within the stomach with the tip at the level of the gastric fundus. Cardiomegaly. Calcified tortuous aorta. Pulmonary vascular congestion/ pulmonary edema similar to prior exam.  Poor inspiration without evidence gross pneumothorax. IMPRESSION: Endotracheal tube tip 1.6 cm above the carina. Poor inspiration with pulmonary vascular congestion/ pulmonary edema appearing similar to prior exam. Cardiomegaly. Aortic atherosclerosis. Electronically Signed   By: Genia Del M.D.   On: 07/23/2016 07:35    STUDIES:  DG Chest 10/6 > Hypoexpanded lungs. Fluffy central airspace opacification suggestive of pulmonary edema. CT head 9/24 > No acute changes, intracranial mass or hemorrhage.   ECHO 12/21/15 > LVEF 60-65% with LVH and dilation of LA. PA pressure 29.  ECHO 10/9 >> LVEF 60-65%, no RWMA, grade 1 diastolic dysfunction, PAS within normal range  CULTURES: Blood cx x2 10/6 >> coag neg staph 2/2 bottles >> R - oxaxillin,  S- vanco Urine cx 10/6 >> multiple species, recollection suggested UC 10/9 >> neg BCx2 10/10 >>   ANTIBIOTICS: Ceftriaxone x 1 10/7 > 10/7, 10/8 (coag neg staph) >> 10/9 Ceftaz 07/20/16 > 10/8 Vancomycin 10/9 (coag neg staph, oxacillin resistant) >>   LINES/TUBES: ETT 10/6 >> Foley cath 10/6 >> RUE PICC 10/7 >>  SIGNIFICANT EVENTS: 10/06  Admitted with acute respiratory failure.  10/07  Patient waking up to exam, on fent gtt. RN describes pus like urine 10/08  SBT but needing diprivan due to tachypnea.  + coag neg staph. ETT at carina and retracted.  10/09  Mild fever persists, blood cultures positive for coag neg staph 2/2, sensitive to vanco 10/10  D/C propofol due to elevated triglycerides, failed SBT 10/11  Failed SBT   DISCUSSION: 70-y/o female with dCHF, obesity hypoventilation syndrome, T2DM, and PVD who presents with acute on chronic respiratory failure, requiring intubation for decreased alertness and increased WOB. CO2 45, increased from baseline ~ 35, suggesting worsening chronic CO2 retention. Not adherent to nightly bipap per available records. CXR consistent with pulmonary edema. Hypoglycemia likely also contributing to decreased level  of alertness. Course notable for coag neg staph bacteremia, difficulty weaning.     ASSESSMENT / PLAN:  PULMONARY A: Acute on chronic hypercarbic respiratory failure Obesity hypoventilation syndrome with acute decompensation Pulmonary edema on CXR P:   PRVC 8 cc/kg  Wean PEEP / FiO2 for sats >92% Intermittent CXR Baseline pCO2 ~80 PSV as tolerated Diuresis as renal function / BP permit Concerned patient may need tracheostomy given deconditioning / slow improvement / failure to wean.  She was essentially bed bound / facility living prior to admit.  ? If a tracheostomy would add meaningful benefit and would likely add to her overall suffering.   CARDIOVASCULAR A:  dCHF -  with concern for exacerbation PVD Mild Elevation in Troponin - likely demand ischemia P:  Repeat ECHO as above, no mention of vegetation Lasix 40 mg IV daily  ICU monitoring of hemodynamics  RENAL A:   Impaired renal function, at baseline (SCr ~1.7) Hypernatremia  P:   Monitor BMP / UOP  NS @ KVO Free water 200 ml Q6 Replace electrolytes as indicated  GASTROINTESTINAL A:   Morbid Obesity  P:   Pepcid for SUP  TF per nutrition   HEMATOLOGIC A:   Mild anemia, likely dilutional  P:  Monitor CBC Heparin for SQ DVT prophylaxis   INFECTIOUS A:   Venous stasis ulcers L shin COAG Neg Staph Bacteremia - note both bottles with staph P:   Continue vancomycin as above Follow repeat blood cultures to ensure clearance Wound Consult for LLE wounds / panus Trend PCT   ENDOCRINE A:   T2DM - poorly controlled with hgb A1c of 10.1 Hypoglycemia, resolved (2nd reported episode); brittle? P:   ICU hyperglycemia SSI Monitor for hypoglycemia  NEUROLOGIC A:   Acute encephalopathy - in setting of hypercapnia, hypoglycemia, resolving Depression  Chronic Pain P:   Daily WUA / SBT PRN fentanyl for pain  RASS goal: 0 Hold home gabapentin,cymbalta   FAMILY  - Updates: No family at bedside 10/11 am.   Niece Brayton Layman) called for update via phone.   - Inter-disciplinary family meet or Palliative Care meeting due by: 10/12   Noe Gens, NP-C [place] [facility] Pgr: [phone] or if no answer [phone] 07/24/2016, 9:13 AM  More alert.  Tolerated pressure support 12/5 >> low Vt with lower PS levels.  Alert. Decreased BS, no wheeze.  HR regular, no murmur.  Abd soft.  Lt lower leg dressing clean.  CXR - decreased lung volumes  Hb 10.7, WBC 7.2, Na 150, Creatinine 1.27  Assessment/plan:  Acute on chronic respiratory failure. OSA/OHS. - pressure support wean >> not ready for extubation trial; concerned she might need trach; will need to have further d/w family - f/u CXR  Acute on chronic diastolic CHF. - negative fluid balance as tolerated >> monitor Na while getting lasix  Fever with Coag negative Staph bacteremia. - continue vancomycin - f/u repeat blood cx from 10/10  Lt leg wounds. - wound care - abx  CC time by me independent of APP time 31 minutes.  Chesley Mires, MD [facility] Pulmonary/Critical Care 07/24/2016, 11:16 AM Pager:  [phone] After 3pm call: [phone]

## 2016-07-24 NOTE — Progress Notes (Signed)
Date:  July 24, 2016 Chart reviewed for concurrent status and case management needs. Will continue to follow the patient for status change: remains on full vent support. Discharge Planning: following for needs Expected discharge date: [id number] Velva Harman, BSN, [place], [place]

## 2016-07-24 NOTE — Progress Notes (Signed)
eLink Physician-Brief Progress Note Patient Name: Heather Zimmerman. [name] DOB: 09-20-1946 MRN: [id number]   Date of Service  07/24/2016  HPI/Events of Note  Called regarding agitation.  Concern that bolused versed and fentanyl have caused labile BP. Will start low dose fentanyl gtt, watch BP responce   eICU Interventions       Intervention Category Minor Interventions: Agitation / anxiety - evaluation and management  Ritchard Paragas S. 07/24/2016, 8:55 PM

## 2016-07-25 ENCOUNTER — Inpatient Hospital Stay (HOSPITAL_COMMUNITY): Payer: Medicare Other

## 2016-07-25 LAB — GLUCOSE, CAPILLARY
GLUCOSE-CAPILLARY: 198 mg/dL — AB (ref 65–99)
GLUCOSE-CAPILLARY: 175 mg/dL — AB (ref 65–99)
GLUCOSE-CAPILLARY: 191 mg/dL — AB (ref 65–99)
GLUCOSE-CAPILLARY: 201 mg/dL — AB (ref 65–99)
Glucose-Capillary: 180 mg/dL — ABNORMAL HIGH (ref 65–99)
Glucose-Capillary: 210 mg/dL — ABNORMAL HIGH (ref 65–99)

## 2016-07-25 LAB — BASIC METABOLIC PANEL
Anion gap: 6 (ref 5–15)
BUN: 37 mg/dL — AB (ref 6–20)
CALCIUM: 9.5 mg/dL (ref 8.9–10.3)
CO2: 45 mmol/L — AB (ref 22–32)
CREATININE: 1.27 mg/dL — AB (ref 0.44–1.00)
Chloride: 103 mmol/L (ref 101–111)
GFR calc Af Amer: 48 mL/min — ABNORMAL LOW (ref >60)
GFR calc non Af Amer: 41 mL/min — ABNORMAL LOW (ref >60)
Glucose, Bld: 213 mg/dL — ABNORMAL HIGH (ref 65–99)
Potassium: 3.7 mmol/L (ref 3.5–5.1)
Sodium: 154 mmol/L — ABNORMAL HIGH (ref 135–145)

## 2016-07-25 LAB — CBC
HCT: 38 % (ref 36.0–46.0)
HEMOGLOBIN: 10.3 g/dL — AB (ref 12.0–15.0)
MCH: 26.1 pg (ref 26.0–34.0)
MCHC: 27.1 g/dL — ABNORMAL LOW (ref 30.0–36.0)
MCV: 96.4 fL (ref 78.0–100.0)
PLATELETS: 200 10*3/uL (ref 150–400)
RBC: 3.94 MIL/uL (ref 3.87–5.11)
RDW: 19.1 % — AB (ref 11.5–15.5)
WBC: 7.4 10*3/uL (ref 4.0–10.5)

## 2016-07-25 LAB — VANCOMYCIN, TROUGH: VANCOMYCIN TR: 21 ug/mL — AB (ref 15–20)

## 2016-07-25 MED ORDER — PHENOL 1.4 % MT LIQD
1.0000 | OROMUCOSAL | Status: DC | PRN
  Administered 2016-07-25 – 2016-07-26 (×2): 1 via OROMUCOSAL
  Filled 2016-07-25: qty 177

## 2016-07-25 MED ORDER — STERILE WATER FOR INJECTION IJ SOLN
INTRAMUSCULAR | Status: AC
  Administered 2016-07-25: 11:00:00
  Filled 2016-07-25: qty 10

## 2016-07-25 MED ORDER — METHYLPREDNISOLONE SODIUM SUCC 40 MG IJ SOLR
INTRAMUSCULAR | Status: AC
  Filled 2016-07-25: qty 1

## 2016-07-25 MED ORDER — ASPIRIN 81 MG PO CHEW
81.0000 mg | CHEWABLE_TABLET | Freq: Every day | ORAL | Status: DC
  Administered 2016-07-25 – 2016-08-01 (×8): 81 mg via ORAL
  Filled 2016-07-25 (×8): qty 1

## 2016-07-25 MED ORDER — METHYLPREDNISOLONE SODIUM SUCC 40 MG IJ SOLR
40.0000 mg | Freq: Four times a day (QID) | INTRAMUSCULAR | Status: AC
  Administered 2016-07-25 – 2016-07-26 (×4): 40 mg via INTRAVENOUS
  Filled 2016-07-25 (×3): qty 1

## 2016-07-25 MED ORDER — DEXTROSE 5 % IV SOLN
INTRAVENOUS | Status: DC
  Administered 2016-07-25: 40 mL via INTRAVENOUS
  Administered 2016-07-27: 13:00:00 via INTRAVENOUS

## 2016-07-25 MED ORDER — ALTEPLASE 2 MG IJ SOLR
2.0000 mg | Freq: Once | INTRAMUSCULAR | Status: AC
  Administered 2016-07-25: 2 mg
  Filled 2016-07-25: qty 2

## 2016-07-25 MED ORDER — ACETAMINOPHEN 650 MG RE SUPP: 650.0000 mg | RECTAL | Status: DC | PRN

## 2016-07-25 MED ORDER — VANCOMYCIN HCL 10 G IV SOLR
1500.0000 mg | INTRAVENOUS | Status: DC
  Administered 2016-07-25 – 2016-07-28 (×4): 1500 mg via INTRAVENOUS
  Filled 2016-07-25 (×4): qty 1500

## 2016-07-25 MED ORDER — FAMOTIDINE IN NACL 20-0.9 MG/50ML-% IV SOLN
20.0000 mg | Freq: Two times a day (BID) | INTRAVENOUS | Status: DC
  Administered 2016-07-25 (×2): 20 mg via INTRAVENOUS
  Filled 2016-07-25 (×2): qty 50

## 2016-07-25 NOTE — Progress Notes (Signed)
Pharmacy Antibiotic Note  Heather Zimmerman. Heather Zimmerman is a 70 y.o. female admitted on 07/19/2016 with AMS, SOB. Blood cx from 10/6 positive for 2/2 with CoNS. BCID shows no resistance patterns. However, blood cultures do indicate oxacillin resistance. Repeat blood cultures on 10/10 with no growth to date.  Today, 07/25/2016: Day #4 Vancomycin  Renal : CKD, CrCl (Normalized) ~ 45 ml/min/1.15m2  WBC WNL  Tmax 99.23F  Vancomycin trough level = 21 mcg/mL, slightly supratherapeutic  Plan:  Adjust Vancomycin to 1500mg  IV q24h per trough level.  Follow renal function, repeat blood cultures, repeat VT as indicated, clinical course.   Height: 5\' 2"  (157.5 cm) Weight: (!) 308 lb (139.7 kg) IBW/kg (Calculated) : 50.1  Temp (24hrs), Avg:99.4 F (37.4 C), Min:98.1 F (36.7 C), Max:99.9 F (37.7 C)   Recent Labs Lab 07/20/16 0006  07/21/16 0349 07/22/16 0400 07/23/16 0418 07/24/16 0427 07/25/16 0300 07/25/16 0845  WBC  --   < > 7.0 7.5 6.8 7.2 7.4  --   CREATININE  --   < > 1.54* 1.40* 1.41* 1.27* 1.27*  --   LATICACIDVEN 1.16  --   --   --   --   --   --   --   VANCOTROUGH  --   --   --   --   --   --   --  21*  < > = values in this interval not displayed.  Estimated Creatinine Clearance: 55.1 mL/min (by C-G formula based on SCr of 1.27 mg/dL (H)).    Allergies  Allergen Reactions  . Ace Inhibitors     unknown    Antimicrobials this admission: 10/7 >> Ceftriaxone x 1, resume 10/8 >>10/9 10/7 >> Ceftazidime >> 10/8 10/9 >> Vanc >>  Microbiology results: 10/6 BCx: 2/2 CoNS - sens Cipro, Gent, Rifampin, Vanc (BCID staph species with no methicillin resistance, but Blood Cx R-oxacillin) 10/7 UCx: multiple, suggest recollection 10/7 MRSA PCR: positive 10/9 UCx (repeat): negative 10/10 BCx (repeat): NGTD   Thank you for allowing pharmacy to be a part of this patient's care.   Lindell Spar, PharmD, BCPS Pager: [phone] 07/25/2016 10:13 AM

## 2016-07-25 NOTE — Progress Notes (Signed)
Not much of cuff leak, but doing well otherwise with pressure support.  Will give solumedrol.  Will proceed with extubation trial.  Needs BiPAP qhs and prn.  Titrate oxygen to keep SpO2 90 to 95%.  Attempted to contact family to update about plan >> no answer.  Chesley Mires, MD [facility] Pulmonary/Critical Care 07/25/2016, 9:07 AM Pager:  [phone] After 3pm call: [phone]

## 2016-07-25 NOTE — Progress Notes (Signed)
Fentanyl 2500 mcg  In NaCL 0.9 % 250 ml ( 10 mcg/ml) 200 cc wasted in sink. Witnessed [facility]

## 2016-07-25 NOTE — Progress Notes (Signed)
RT Note: pt placed on PS trial by MD, pt on PS 10/5 on arrival to room, pt tolerating well.

## 2016-07-25 NOTE — Progress Notes (Signed)
PULMONARY / CRITICAL CARE MEDICINE   Name: Heather Sorell. Zimmerman MRN: [id number] DOB: [birth year]    ADMISSION DATE:  07/19/2016 CONSULTATION DATE:  07/20/2016  REFERRING MD:  Dr. Joseph Berkshire  CHIEF COMPLAINT:  AMS, SOB   SUBJECTIVE:  Denies chest pain.  Tolerating PS 10/5.  VITAL SIGNS: BP (!) 133/58   Pulse 85   Temp 99.1 F (37.3 C)   Resp 16   Ht 5\' 2"  (1.575 m)   Wt (!) 308 lb (139.7 kg)   LMP  (LMP Unknown)   SpO2 98%   BMI 56.33 kg/m   VENTILATOR SETTINGS: Vent Mode: PRVC FiO2 (%):  [30 %] 30 % Set Rate:  [18 bmp] 18 bmp Vt Set:  [350 mL] 350 mL PEEP:  [5 cmH20] 5 cmH20 Plateau Pressure:  [18 cmH20-28 cmH20] 18 cmH20  INTAKE / OUTPUT: I/O last 3 completed shifts: In: 2149.1 [I.V.:369.1; NG/GT:1280; IV Piggyback:500] Out: 3350 [Urine:3350]  PHYSICAL EXAMINATION: General: alert Neuro: follows commands HEENT: ETT in place Cardiac: regular, no murmur Chest: decreased BS, no wheeze Abd: soft, non tender Ext: Lt lower leg wound dressing clean, Rt BKA Skin: no rashes  LABS:  PULMONARY  Recent Labs Lab 07/20/16 0250 07/20/16 0517 07/20/16 0915 07/21/16 0335  PHART 7.300* 7.545*  --  7.421  PCO2ART 106* 63.5*  --  80.3*  PO2ART 77.2* 88.0  --  71.5*  HCO3 50.6* 54.8* 50.9* 50.6*  TCO2  --  >50  --   --   O2SAT 94.3 97.0 78.2 93.0    CBC  Recent Labs Lab 07/23/16 0418 07/24/16 0427 07/25/16 0300  HGB 10.1* 10.7* 10.3*  HCT 35.9* 39.2 38.0  WBC 6.8 7.2 7.4  PLT 191 197 200    COAGULATION No results for input(s): INR in the last 168 hours.  CARDIAC    Recent Labs Lab 07/20/16 1017 07/20/16 1430 07/21/16 0349  TROPONINI 0.05* 0.05* 0.04*   No results for input(s): PROBNP in the last 168 hours.   CHEMISTRY  Recent Labs Lab 07/20/16 0310 07/21/16 0349 07/22/16 0400 07/23/16 0418 07/24/16 0427 07/25/16 0300  NA 145 145 149* 150* 152* 154*  K 4.1 3.6 3.8 3.7 3.6 3.7  CL 94* 94* 97* 97* 100* 103  CO2 45* 45* 49* 47*  45* 45*  GLUCOSE 122* 169* 147* 159* 192* 213*  BUN 45* 47* 40* 40* 38* 37*  CREATININE 1.66* 1.54* 1.40* 1.41* 1.27* 1.27*  CALCIUM 8.1* 8.2* 8.8* 9.0 9.3 9.5  MG 2.4  --   --   --   --   --   PHOS 3.5  --   --   --   --   --    Estimated Creatinine Clearance: 55.1 mL/min (by C-G formula based on SCr of 1.27 mg/dL (H)).   LIVER  Recent Labs Lab 07/19/16 2320  AST 19  ALT 16  ALKPHOS 71  BILITOT 0.6  PROT 8.1  ALBUMIN 2.9*     INFECTIOUS  Recent Labs Lab 07/20/16 0006 07/20/16 1017 07/21/16 0349 07/22/16 0400  LATICACIDVEN 1.16  --   --   --   PROCALCITON  --  <0.10 <0.10 <0.10    ENDOCRINE CBG (last 3)   Recent Labs  07/25/16 0029 07/25/16 0337 07/25/16 0726  GLUCAP 210* 198* 180*     IMAGING Dg Chest Port 1 View  Result Date: 07/25/2016 CLINICAL DATA:  Hypoxia EXAM: PORTABLE CHEST 1 VIEW COMPARISON:  July 24, 2016 FINDINGS: Endotracheal tube tip is 3.3 cm  above the carina. Central catheter tip is at the cavoatrial junction. Nasogastric tube tip and side port are below the diaphragm. No pneumothorax. There is persistent mild generalized interstitial edema. There is no airspace consolidation. There is stable cardiomegaly with pulmonary venous hypertension. There is atherosclerotic calcification in the aorta. No adenopathy. IMPRESSION: Tube and catheter positions as described without pneumothorax. Persistent mild interstitial edema with cardiomegaly and pulmonary venous hypertension consistent with a degree of congestive heart failure. No new opacity. There is aortic atherosclerosis. Electronically Signed   By: Lowella Grip III M.D.   On: 07/25/2016 07:16   Dg Chest Port 1 View  Result Date: 07/24/2016 CLINICAL DATA:  Hypoxia EXAM: PORTABLE CHEST 1 VIEW COMPARISON:  July 23, 2016 FINDINGS: Endotracheal tube tip is 4.0 cm above the carina. Central catheter tip is in the superior vena cava. Nasogastric tube tip and side port are below the diaphragm.  No pneumothorax. There is stable mild generalized interstitial pulmonary edema. There is no airspace consolidation. There is cardiomegaly with pulmonary venous hypertension. There is aortic atherosclerosis. No adenopathy. IMPRESSION: Tube and catheter positions as described without pneumothorax. Evidence of a degree of congestive heart failure, stable. No new opacity. Aortic atherosclerosis noted. Electronically Signed   By: Lowella Grip III M.D.   On: 07/24/2016 07:57    STUDIES:  ECHO 10/9 >> LVEF 60-65%, no RWMA, grade 1 diastolic dysfunction, PAS within normal range  CULTURES: Blood cx x2 10/6 >> coag neg staph 2/2 bottles >> R - oxaxillin, S- vanco Urine cx 10/6 >> multiple species UC 10/9 >> negative BCx2 10/10 >>   ANTIBIOTICS: Ceftriaxone x 1 10/7 > 10/7, 10/8 (coag neg staph) >> 10/9 Ceftaz 07/20/16 > 10/8 Vancomycin 10/9 (coag neg staph, oxacillin resistant) >>   LINES/TUBES: ETT 10/6 >> RUE PICC 10/7 >>  SIGNIFICANT EVENTS: 10/06  Admitted with acute respiratory failure.  10/07  Patient waking up to exam, on fent gtt. RN describes pus like urine 10/08  SBT but needing diprivan due to tachypnea.  + coag neg staph. ETT at carina and retracted.  10/09  Mild fever persists, blood cultures positive for coag neg staph 2/2, sensitive to vanco 10/10  D/C propofol due to elevated triglycerides, failed SBT 10/11  Failed SBT   DISCUSSION: 70 yo female presented with dyspnea and altered mental status with VDRF from acute on chronic hypoxic/hypercapnic respiratory failure in setting of OSA/OHS, acute on chronic diastolic CHF.  Found to have sepsis with coag negative Staph bacteremia, Lt lower leg cellulitis.  ASSESSMENT / PLAN:  PULMONARY A: Acute on chronic hypercarbic respiratory failure. OSA/OHS. P:   Pressure support wean as tolerated >> might consider trial of extubation first, and if fails discuss with family about trach F/u CXR intermittently   CARDIOVASCULAR A:   Acute pulmonary edema >> improved. Acute on chronic diastolic CHF. Hx of PVD. P:  Hold lasix with elevated Na Monitor hemodynamics ASA  RENAL A:   Hypernatremia  P:   D5W at 40 ml/hr Hold lasix F/u BMET  GASTROINTESTINAL A:   Morbid Obesity. P:   Nutrition assessment once more stable Zantac for SUP while on vent   HEMATOLOGIC A:   Anemia of critical illness. P:  F/u CBC SQ heparin for DVT prophylaxis  INFECTIOUS A:   Sepsis from Lt lower leg cellulitis with coag negative Staph bacteremia. P:   Day 4 of vancomycin F/u repeat blood culture from 10/10 Wound care  ENDOCRINE A:   DM type II. P:  SSI  NEUROLOGIC A:   Acute metabolic encephalopathy. Hx of depression, chronic pain, DM neuropathy. P:   RASS goal 0 Hold outpt neurontin, cymbalta for now  CC time 32 minutes  Chesley Mires, MD [place] 07/25/2016, 8:38 AM Pager:  [phone] After 3pm call: [phone]

## 2016-07-25 NOTE — Procedures (Signed)
Extubation Procedure Note  Patient Details:   Name: Heather Zimmerman. Kollias DOB: 05/06/1946 MRN: [id number]   Airway Documentation:     Evaluation  O2 sats: stable throughout Complications: No apparent complications Patient did tolerate procedure well. Bilateral Breath Sounds: Diminished   Pt extubated per MD order at this time, placed on 4L [place] tolerating well.   Ciro Backer 07/25/2016, 9:16 AM

## 2016-07-25 NOTE — Progress Notes (Signed)
Nutrition Follow-up  DOCUMENTATION CODES:   Morbid obesity  INTERVENTION:  - Diet advancement if/when medically feasible. - RD will follow-up 10/13.  NUTRITION DIAGNOSIS:   Inadequate oral intake related to inability to eat as evidenced by NPO status. -ongoing  GOAL:   Patient will meet greater than or equal to 90% of their needs -updated, unable to meet.  MONITOR:   Diet advancement, Weight trends, Labs, Skin, I & O's  ASSESSMENT:   70-y/o female with PMH significant for dCHF, T2DM, sleep apnea not on regular cpap, obesity hypoventilation syndrome on O2, PVD with chronic stasis ulcers and R BKA who presents from SNF with acute respiratory failure and lethargy. She was placed on bipap but continued to have increased WOB and decreased alertness so was intubated in the ED. Pulmonary edema on CXR.  10/12 TF held and subsequently d/c'ed for extubation earlier this AM and OGT removed at that time. Pt currently on [place]. Estimated nutrition needs updated based on extubation; pt remains NPO. RD will follow-up tomorrow for POC and provide interventions as warranted. Weight -3.2 kg since yesterday.   Medications reviewed; 20 mg IV Pepcid BID, sliding scale Novolog, 40 mg IV Solu-medrol QID, 20 mEq KCl x1 dose yesterday. Labs reviewed; CBGs: 180-210 mg/dL this AM, Na: 154 mmol/L, BUN: 37 mg/dL, creatinine: 1.27 mg/dL, GFR: 48 mL/min.  IVF: D5 @ 40 mL/hr (163 kcal).     07/24/16 - Vital High Protein infusing @ 35 mL/hr (goal rate) at time of visit.  - Pump programmed with free water flushes of 50 mL QID. - Weight -6.3 kg since yesterday.  - Estimated energy and protein needs remain the same. Continue current TF regimen. - Per NP note, increase free water flushes to 200 mL Q6.  Pt is currently intubated on ventilator support. MV: 6.5 L/min Max temperature in 24 hours: 38.1 degrees Celsius BP: 123/49 and MAP: 73    07/23/16 - Vital High Protein @ 20 mL/hr infusing through OGT. -  Free water flushes of 50 mL every 6 hours. - Propofol drip off for wake-up assessment. - Change TF regimen to Vital High Protein @ 35 mL/hr with Pro-stat TID. - This regimen provides 1140 kcal, 119 grams protein, and 706 mL free water.  Pt is currently intubated on ventilator support. MV: 8.0 L/min Max temperature in 24 hours: 38.1 degrees Celsius BP:99/31 and MAP:52 Drips: Propofol off at time of visit.    Diet Order:  Diet NPO time specified  Skin:  Wound (see comment) (Open wounds left leg, Right BKA)  Last BM:  10/10  Height:   Ht Readings from Last 1 Encounters:  07/20/16 5\' 2"  (1.575 m)    Weight:   Wt Readings from Last 1 Encounters:  07/25/16 (!) 308 lb (139.7 kg)    Ideal Body Weight:  46.75 kg  BMI:  Body mass index is 56.33 kg/m.  Estimated Nutritional Needs:   Kcal:  1535-1815 (11-13 kcal/kg actual body weight)  Protein:  110-125 grams  Fluid:  1.5-1.8 L/day  EDUCATION NEEDS:   No education needs identified at this time    Jarome Matin, MS, RD, LDN Inpatient Clinical Dietitian Pager # [phone] After hours/weekend pager # [phone]
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
## 2016-07-26 LAB — BASIC METABOLIC PANEL
ANION GAP: 6 (ref 5–15)
BUN: 34 mg/dL — ABNORMAL HIGH (ref 6–20)
CHLORIDE: 105 mmol/L (ref 101–111)
CO2: 42 mmol/L — AB (ref 22–32)
Calcium: 9 mg/dL (ref 8.9–10.3)
Creatinine, Ser: 1.23 mg/dL — ABNORMAL HIGH (ref 0.44–1.00)
GFR calc non Af Amer: 43 mL/min — ABNORMAL LOW (ref >60)
GFR, EST AFRICAN AMERICAN: 50 mL/min — AB (ref >60)
GLUCOSE: 191 mg/dL — AB (ref 65–99)
Potassium: 4 mmol/L (ref 3.5–5.1)
Sodium: 153 mmol/L — ABNORMAL HIGH (ref 135–145)

## 2016-07-26 LAB — GLUCOSE, CAPILLARY
GLUCOSE-CAPILLARY: 188 mg/dL — AB (ref 65–99)
GLUCOSE-CAPILLARY: 169 mg/dL — AB (ref 65–99)
GLUCOSE-CAPILLARY: 182 mg/dL — AB (ref 65–99)
GLUCOSE-CAPILLARY: 176 mg/dL — AB (ref 65–99)
GLUCOSE-CAPILLARY: 198 mg/dL — AB (ref 65–99)
Glucose-Capillary: 202 mg/dL — ABNORMAL HIGH (ref 65–99)

## 2016-07-26 MED ORDER — ATROPINE SULFATE 1 MG/10ML IJ SOSY
PREFILLED_SYRINGE | INTRAMUSCULAR | Status: AC
  Filled 2016-07-26: qty 10

## 2016-07-26 NOTE — Progress Notes (Signed)
Speech Language Pathology Treatment: Dysphagia  Patient Details Name: Heather Zimmerman MRN: [id number] DOB: [birth year] Today's Date: 07/26/2016 Time: [id number] SLP Time Calculation (min) (ACUTE ONLY): 15 min  Assessment / Plan / Recommendation Clinical Impression  Pt demonstrates signs of improved airway protection with over 24 hours s/p extubation. Pt is alert in chair, tolerating ice chips through the day per RN. Vocal quality is soft with poor breath support but only mildly hoarse with cues for deep breath and sustained phonation. Pts articulation for speech intelligibility is impaired, also making her difficult to understand, though oral motor strength is WNL on exam. Suspect this may be a baseline speech pattern.   SLP offered 3 oz of continuous intake via straw x3 trials with no signs of aspiration during session. Pt did have soft belching after solids and liquids and reported mild globus in the sternal area with solids. Recommend pt initiate a dys 2 (ground) diet with thin liquids with basic aspiration and esophageal precautions. Encouraged small sips and following bites with sips which pt return demonstrated. Will f/u for tolerance and diet advancement.    HPI HPI: 70 yo female adm to [facility] with AMS, hypoxia, required intubation.  PMH + for CHF, DM type 2, sleep apnea, hypoventilation syndrome, obesity, chronic venous stasis.  Pt was extubated today at 0915 am and swallow evaluation ordered.  CXR showed no airspace consolidation.        SLP Plan  Continue with current plan of care     Recommendations  Diet recommendations: Dysphagia 2 (fine chop);Thin liquid Liquids provided via: Cup;Straw Medication Administration: Whole meds with liquid Supervision: Patient able to self feed Compensations: Slow rate;Small sips/bites;Follow solids with liquid Postural Changes and/or Swallow Maneuvers: Seated upright 90 degrees;Upright 30-60 min after meal                Oral Care  Recommendations: Oral care BID Follow up Recommendations: None Plan: Continue with current plan of care       [place] Clemence Lengyel, MA CCC-SLP [id number]  Lynann Beaver 07/26/2016, 1:18 PM

## 2016-07-26 NOTE — Progress Notes (Signed)
NUTRITION NOTE  Pt seen for full follow-up overnight. Spoke with RN who reports that pt is on [place] this AM but was on Bi-PAP over night. She states that pt has been taking ice chips this AM and that swallow evaluation today is pending. SLP had seen pt yesterday and note from that encounter reviewed.   RD will follow-up on 10/16. Estimated nutrition needs were updated yesterday based on extubation.   Estimated Nutritional Needs:  Kcal:  1535-1815 (11-13 kcal/kg actual body weight) Protein:  110-125 grams Fluid:  1.5-1.8 L/day    Jarome Matin, MS, RD, LDN Inpatient Clinical Dietitian Pager # [phone] After hours/weekend pager # [phone]

## 2016-07-26 NOTE — Progress Notes (Signed)
CSW following for return to [facility] when medically ready. CSW will complete FL2 closer to discharge & will continue to follow and assist with return.    Raynaldo Opitz, [facility] Clinical Social Worker cell #: [phone]

## 2016-07-26 NOTE — Progress Notes (Addendum)
PULMONARY / CRITICAL CARE MEDICINE   Name: Heather Zimmerman. [place] MRN: [id number] DOB: April 18, 1946    ADMISSION DATE:  07/19/2016 CONSULTATION DATE:  07/20/2016  REFERRING MD:  Dr. Joseph Berkshire  CHIEF COMPLAINT:  AMS, SOB   SUBJECTIVE:  Wants something to eat.  VITAL SIGNS: BP 139/87   Pulse 73   Temp 98.4 F (36.9 C)   Resp 20   Ht 5\' 2"  (1.575 m)   Wt (!) 316 lb (143.3 kg)   LMP  (LMP Unknown)   SpO2 100%   BMI 57.80 kg/m   INTAKE / OUTPUT: I/O last 3 completed shifts: In: 2228.1 [I.V.:943.9; JL:3343820; NG/GT:509.3; IV Piggyback:550] Out: 2030 [Urine:2030]  PHYSICAL EXAMINATION: General: alert Neuro: follows commands HEENT: no stridor Cardiac: regular, no murmur Chest: decreased BS, no wheeze Abd: soft, non tender Ext: Lt lower leg wound dressing clean, Rt BKA Skin: no rashes  LABS:  PULMONARY  Recent Labs Lab 07/20/16 0250 07/20/16 0517 07/20/16 0915 07/21/16 0335  PHART 7.300* 7.545*  --  7.421  PCO2ART 106* 63.5*  --  80.3*  PO2ART 77.2* 88.0  --  71.5*  HCO3 50.6* 54.8* 50.9* 50.6*  TCO2  --  >50  --   --   O2SAT 94.3 97.0 78.2 93.0    CBC  Recent Labs Lab 07/23/16 0418 07/24/16 0427 07/25/16 0300  HGB 10.1* 10.7* 10.3*  HCT 35.9* 39.2 38.0  WBC 6.8 7.2 7.4  PLT 191 197 200    COAGULATION No results for input(s): INR in the last 168 hours.  CARDIAC    Recent Labs Lab 07/20/16 1017 07/20/16 1430 07/21/16 0349  TROPONINI 0.05* 0.05* 0.04*   No results for input(s): PROBNP in the last 168 hours.   CHEMISTRY  Recent Labs Lab 07/20/16 0310  07/22/16 0400 07/23/16 0418 07/24/16 0427 07/25/16 0300 07/26/16 0428  NA 145  < > 149* 150* 152* 154* 153*  K 4.1  < > 3.8 3.7 3.6 3.7 4.0  CL 94*  < > 97* 97* 100* 103 105  CO2 45*  < > 49* 47* 45* 45* 42*  GLUCOSE 122*  < > 147* 159* 192* 213* 191*  BUN 45*  < > 40* 40* 38* 37* 34*  CREATININE 1.66*  < > 1.40* 1.41* 1.27* 1.27* 1.23*  CALCIUM 8.1*  < > 8.8* 9.0 9.3 9.5 9.0   MG 2.4  --   --   --   --   --   --   PHOS 3.5  --   --   --   --   --   --   < > = values in this interval not displayed. Estimated Creatinine Clearance: 57.9 mL/min (by C-G formula based on SCr of 1.23 mg/dL (H)).   LIVER  Recent Labs Lab 07/19/16 2320  AST 19  ALT 16  ALKPHOS 71  BILITOT 0.6  PROT 8.1  ALBUMIN 2.9*    INFECTIOUS  Recent Labs Lab 07/20/16 0006 07/20/16 1017 07/21/16 0349 07/22/16 0400  LATICACIDVEN 1.16  --   --   --   PROCALCITON  --  <0.10 <0.10 <0.10    ENDOCRINE CBG (last 3)   Recent Labs  07/25/16 2347 07/26/16 0411 07/26/16 0742  GLUCAP 202* 188* 169*     IMAGING Dg Chest Port 1 View  Result Date: 07/25/2016 CLINICAL DATA:  Hypoxia EXAM: PORTABLE CHEST 1 VIEW COMPARISON:  July 24, 2016 FINDINGS: Endotracheal tube tip is 3.3 cm above the carina. Central catheter tip is  at the cavoatrial junction. Nasogastric tube tip and side port are below the diaphragm. No pneumothorax. There is persistent mild generalized interstitial edema. There is no airspace consolidation. There is stable cardiomegaly with pulmonary venous hypertension. There is atherosclerotic calcification in the aorta. No adenopathy. IMPRESSION: Tube and catheter positions as described without pneumothorax. Persistent mild interstitial edema with cardiomegaly and pulmonary venous hypertension consistent with a degree of congestive heart failure. No new opacity. There is aortic atherosclerosis. Electronically Signed   By: Lowella Grip III M.D.   On: 07/25/2016 07:16    STUDIES:  ECHO 10/9 >> LVEF 60-65%, no RWMA, grade 1 diastolic dysfunction, PAS within normal range  CULTURES: Blood cx x2 10/6 >> coag neg staph 2/2 bottles >> R - oxaxillin, S- vanco Urine cx 10/6 >> multiple species UC 10/9 >> negative BCx2 10/10 >>   ANTIBIOTICS: Ceftriaxone x 1 10/7 > 10/7, 10/8 (coag neg staph) >> 10/9 Ceftaz 07/20/16 > 10/8 Vancomycin 10/9 (coag neg staph, oxacillin  resistant) >>   LINES/TUBES: ETT 10/6 >> 10/12 RUE PICC 10/7 >>  SIGNIFICANT EVENTS: 10/06 Admitted with acute respiratory failure.  10/07 Patient waking up to exam, on fent gtt. RN describes pus like urine 10/08 SBT but needing diprivan due to tachypnea.  + coag neg staph. ETT at carina and retracted.  10/09 Mild fever persists, blood cultures positive for coag neg staph 2/2, sensitive to vanco 10/10 D/C propofol due to elevated triglycerides, failed SBT 10/13 Change to SDU status  DISCUSSION: 70 yo female presented with dyspnea and altered mental status with VDRF from acute on chronic hypoxic/hypercapnic respiratory failure in setting of OSA/OHS, acute on chronic diastolic CHF.  Found to have sepsis with coag negative Staph bacteremia, Lt lower leg cellulitis.  ASSESSMENT / PLAN:  Acute on chronic hypercarbic respiratory failure. OSA/OHS. P:   Needs Bipap at night and prn during the day Oxygen to keep SpO2 90 to 95%   Acute pulmonary edema >> improved. Acute on chronic diastolic CHF. Hx of PVD. P:  Hold lasix with elevated Na Monitor hemodynamics ASA once able to swallow pills  Hypernatremia. P:   D5W at 40 ml/hr Hold lasix F/u BMET  Morbid Obesity. Dysphagia. P:   F/u with speech therapy >> NPO with ice chips for now Nutrition assessment once more stable D/c SUP  Anemia of critical illness. P:  F/u CBC intermittently SQ heparin for DVT prophylaxis  Sepsis from Lt lower leg cellulitis with coag negative Staph bacteremia (present on admission) P:   Day 5 of vancomycin F/u repeat blood culture from 10/10 Wound care  DM type II. P:   SSI  Acute metabolic encephalopathy >> improved. Hx of depression, chronic pain, DM neuropathy. P:   Hold outpt neurontin, cymbalta until able to swallow pills  Deconditioning. P: PT/OT assessment  Attempted to contact pt's family >> no answer at listed numbers.  Change to SDU 10/13 >> To [place] 10/14 and PCCM  off.  Chesley Mires, MD [facility] Pulmonary/Critical Care 07/26/2016, 9:56 AM Pager:  [phone] After 3pm call: [phone]

## 2016-07-26 NOTE — Progress Notes (Signed)
Inpatient Diabetes Program Recommendations  AACE/ADA: New Consensus Statement on Inpatient Glycemic Control (2015)  Target Ranges:  Prepandial:   less than 140 mg/dL      Peak postprandial:   less than 180 mg/dL (1-2 hours)      Critically ill patients:  140 - 180 mg/dL   Lab Results  Component Value Date   GLUCAP 169 (H) 07/26/2016   HGBA1C 10.1 04/03/2016    Review of Glycemic Control  Blood sugars > goal for inpatient.   Inpatient Diabetes Program Recommendations:     Please consider the ICU Glycemic Control Protocol for improving glycemic control.  Will continue to follow. Thank you. Lorenda Peck, RD, LDN, CDE Inpatient Diabetes Coordinator [phone]

## 2016-07-26 NOTE — Progress Notes (Addendum)
CCM notified of pt brady down to HR 27, Type 2 AV block Mobitz II, per CCM place Atropine at bedside. Will continue to monitor.

## 2016-07-27 LAB — CBC
HCT: 37.8 % (ref 36.0–46.0)
HEMOGLOBIN: 10.4 g/dL — AB (ref 12.0–15.0)
MCH: 25.9 pg — AB (ref 26.0–34.0)
MCHC: 27.5 g/dL — AB (ref 30.0–36.0)
MCV: 94.3 fL (ref 78.0–100.0)
PLATELETS: 210 10*3/uL (ref 150–400)
RBC: 4.01 MIL/uL (ref 3.87–5.11)
RDW: 19 % — AB (ref 11.5–15.5)
WBC: 6.8 10*3/uL (ref 4.0–10.5)

## 2016-07-27 LAB — GLUCOSE, CAPILLARY
GLUCOSE-CAPILLARY: 168 mg/dL — AB (ref 65–99)
GLUCOSE-CAPILLARY: 189 mg/dL — AB (ref 65–99)
GLUCOSE-CAPILLARY: 192 mg/dL — AB (ref 65–99)
Glucose-Capillary: 171 mg/dL — ABNORMAL HIGH (ref 65–99)
Glucose-Capillary: 218 mg/dL — ABNORMAL HIGH (ref 65–99)
Glucose-Capillary: 229 mg/dL — ABNORMAL HIGH (ref 65–99)

## 2016-07-27 LAB — BASIC METABOLIC PANEL
ANION GAP: 5 (ref 5–15)
BUN: 37 mg/dL — AB (ref 6–20)
CALCIUM: 9 mg/dL (ref 8.9–10.3)
CO2: 42 mmol/L — ABNORMAL HIGH (ref 22–32)
CREATININE: 1.36 mg/dL — AB (ref 0.44–1.00)
Chloride: 105 mmol/L (ref 101–111)
GFR calc Af Amer: 44 mL/min — ABNORMAL LOW (ref >60)
GFR, EST NON AFRICAN AMERICAN: 38 mL/min — AB (ref >60)
GLUCOSE: 182 mg/dL — AB (ref 65–99)
Potassium: 3.6 mmol/L (ref 3.5–5.1)
Sodium: 152 mmol/L — ABNORMAL HIGH (ref 135–145)

## 2016-07-27 MED ORDER — INFLUENZA VAC SPLIT QUAD 0.5 ML IM SUSY
0.5000 mL | PREFILLED_SYRINGE | INTRAMUSCULAR | Status: AC
  Administered 2016-07-28: 0.5 mL via INTRAMUSCULAR
  Filled 2016-07-27: qty 0.5

## 2016-07-27 NOTE — Progress Notes (Signed)
PT Cancellation Note  Patient Details Name: Heather Zimmerman. [place] MRN: [id number] DOB: [birth year]   Cancelled Treatment:     PT order received but eval deferred.  Pt just back to bed with nursing using lift equipment.  Will follow.   Lux Meaders 07/27/2016, 4:18 PM

## 2016-07-27 NOTE — Progress Notes (Signed)
RN called RT to let RT know that he took patient off BIPAP and placed patient on 3 LNC.

## 2016-07-27 NOTE — Progress Notes (Signed)
OT Cancellation Note  Patient Details Name: Heather Zimmerman. [place] MRN: [id number] DOB: 1946-09-07   Cancelled Treatment:    Reason Eval/Treat Not Completed: Other (comment)  Noted pt a long term resident at SNF- will defer OT eval to SNF. Kari Baars, [place]  Payton Mccallum D 07/27/2016, 10:19 AM

## 2016-07-27 NOTE — Progress Notes (Signed)
PROGRESS NOTE    Heather Zimmerman. [name]  DJS:[id number] DOB: 10/31/1945 DOA: 07/19/2016 PCP: Gildardo Cranker, DO    Brief Narrative:  70 yo female presented with dyspnea and altered mental status with VDRF from acute on chronic hypoxic/hypercapnic respiratory failure in setting of OSA/OHS, acute on chronic diastolic CHF.  Found to have sepsis with coag negative Staph bacteremia, Lt lower leg cellulitis.   Assessment & Plan:  Acute on chronic hypercarbic respiratory failure. OSA/OHS. P:   Needs Bipap at night and prn during the day Oxygen to keep SpO2 90 to 95%   Acute pulmonary edema >> improved. Acute on chronic diastolic CHF. Hx of PVD. P:  Continue holding lasix with elevated Na Monitor hemodynamics ASA once able to swallow pills  Hypernatremia. P:   D5W at 40 ml/hr, Sodium coming down slowly. Will continue gentle fluid rehydration. Continue holding lasix F/u BMET  Morbid Obesity. Dysphagia. P:   F/u with speech therapy >> NPO with ice chips for now Nutrition assessment once more stable D/c SUP  Anemia of critical illness. P:  F/u CBC intermittently SQ heparin for DVT prophylaxis  Sepsis from Lt lower leg cellulitis with coag negative Staph bacteremia (present on admission) P:   Day 6 of vancomycin F/u repeat blood culture from 10/10 Wound care Consult ID  DM type II. P:   SSI  Acute metabolic encephalopathy >> improved. Hx of depression, chronic pain, DM neuropathy. P:   Hold outpt neurontin, cymbalta until able to swallow pills  Deconditioning. P: PT/OT on board  DVT prophylaxis: Heparin Code Status: Full Family Communication:  Disposition Plan: Pending improvement in condition   Consultants:   Consult ID   Procedures: s/p intubation   Antimicrobials: Vancomycin   Subjective:   Objective: Vitals:   07/27/16 0436 07/27/16 0600 07/27/16 0700 07/27/16 0800  BP:  (!) 113/59  121/62  Pulse:      Resp:  (!) 23 (!) 22 (!) 24    Temp:  98.4 F (36.9 C) 98.1 F (36.7 C) 98.1 F (36.7 C)  TempSrc:   Core (Comment) Core (Comment)  SpO2:  100% 93% 100%  Weight: (!) 144.7 kg (319 lb)     Height:        Intake/Output Summary (Last 24 hours) at 07/27/16 0958 Last data filed at 07/27/16 0800  Gross per 24 hour  Intake             1420 ml  Output              975 ml  Net              445 ml   Filed Weights   07/26/16 0000 07/26/16 0500 07/27/16 0436  Weight: (!) 141.1 kg (311 lb) (!) 143.3 kg (316 lb) (!) 144.7 kg (319 lb)    Examination:  General exam: Appears calm and comfortable, in nad. Respiratory system: Clear to auscultation. Respiratory effort normal. Equal chest rise. Cardiovascular system: S1 & S2 heard, RRR. No JVD, murmurs, rubs, gallops or clicks. No pedal edema. Gastrointestinal system: Abdomen is nondistended, soft and nontender. No organomegaly or masses felt. Normal bowel sounds heard. Central nervous system: Alert and oriented. No focal neurological deficits. Extremities: Symmetric 5 x 5 power. Skin: No rashes, lesions or ulcers, on limited exam. Psychiatry: Judgement and insight appear normal. Mood & affect appropriate.   Data Reviewed: I have personally reviewed following labs and imaging studies  CBC:  Recent Labs Lab 07/21/16 0349 07/22/16 0400 07/23/16 0418  07/24/16 0427 07/25/16 0300 07/27/16 0400  WBC 7.0 7.5 6.8 7.2 7.4 6.8  NEUTROABS 4.5 4.9 4.1  --   --   --   HGB 10.1* 10.0* 10.1* 10.7* 10.3* 10.4*  HCT 36.5 36.0 35.9* 39.2 38.0 37.8  MCV 94.1 95.0 93.2 96.6 96.4 94.3  PLT 185 191 191 197 200 210   Basic Metabolic Panel:  Recent Labs Lab 07/23/16 0418 07/24/16 0427 07/25/16 0300 07/26/16 0428 07/27/16 0400  NA 150* 152* 154* 153* 152*  K 3.7 3.6 3.7 4.0 3.6  CL 97* 100* 103 105 105  CO2 47* 45* 45* 42* 42*  GLUCOSE 159* 192* 213* 191* 182*  BUN 40* 38* 37* 34* 37*  CREATININE 1.41* 1.27* 1.27* 1.23* 1.36*  CALCIUM 9.0 9.3 9.5 9.0 9.0    GFR: Estimated Creatinine Clearance: 52.6 mL/min (by C-G formula based on SCr of 1.36 mg/dL (H)). Liver Function Tests: No results for input(s): AST, ALT, ALKPHOS, BILITOT, PROT, ALBUMIN in the last 168 hours. No results for input(s): LIPASE, AMYLASE in the last 168 hours. No results for input(s): AMMONIA in the last 168 hours. Coagulation Profile: No results for input(s): INR, PROTIME in the last 168 hours. Cardiac Enzymes:  Recent Labs Lab 07/20/16 1017 07/20/16 1430 07/21/16 0349  TROPONINI 0.05* 0.05* 0.04*   BNP (last 3 results) No results for input(s): PROBNP in the last 8760 hours. HbA1C: No results for input(s): HGBA1C in the last 72 hours. CBG:  Recent Labs Lab 07/26/16 1640 07/26/16 2013 07/27/16 0042 07/27/16 0433 07/27/16 0740  GLUCAP 176* 198* 189* 168* 171*   Lipid Profile: No results for input(s): CHOL, HDL, LDLCALC, TRIG, CHOLHDL, LDLDIRECT in the last 72 hours. Thyroid Function Tests: No results for input(s): TSH, T4TOTAL, FREET4, T3FREE, THYROIDAB in the last 72 hours. Anemia Panel: No results for input(s): VITAMINB12, FOLATE, FERRITIN, TIBC, IRON, RETICCTPCT in the last 72 hours. Sepsis Labs:  Recent Labs Lab 07/20/16 1017 07/21/16 0349 07/22/16 0400  PROCALCITON <0.10 <0.10 <0.10    Recent Results (from the past 240 hour(s))  Culture, blood (Routine X 2) w Reflex to ID Panel     Status: Abnormal   Collection Time: 07/19/16 11:18 PM  Result Value Ref Range Status   Specimen Description BLOOD RIGHT FOREARM  Final   Special Requests BOTTLES DRAWN AEROBIC AND ANAEROBIC  Final   Culture  Setup Time   Final    GRAM POSITIVE COCCI IN CLUSTERS ANAEROBIC BOTTLE ONLY CRITICAL RESULT CALLED TO, READ BACK BY AND VERIFIED WITH: B GREEN,PHARMD AT 1833 07/20/16 BY L BENFIELD    Culture (A)  Final    STAPHYLOCOCCUS SPECIES (COAGULASE NEGATIVE) SUSCEPTIBILITIES PERFORMED ON PREVIOUS CULTURE WITHIN THE LAST 5 DAYS.    Report Status 07/23/2016  FINAL  Final  Blood Culture ID Panel (Reflexed)     Status: Abnormal   Collection Time: 07/19/16 11:18 PM  Result Value Ref Range Status   Enterococcus species NOT DETECTED NOT DETECTED Final   Listeria monocytogenes NOT DETECTED NOT DETECTED Final   Staphylococcus species DETECTED (A) NOT DETECTED Final    Comment: CRITICAL RESULT CALLED TO, READ BACK BY AND VERIFIED WITH: B GREEN,PHARMD AT 1833 07/20/16 BY L BENFIELD    Staphylococcus aureus NOT DETECTED NOT DETECTED Final   Methicillin resistance NOT DETECTED NOT DETECTED Final   Streptococcus species NOT DETECTED NOT DETECTED Final   Streptococcus agalactiae NOT DETECTED NOT DETECTED Final   Streptococcus pneumoniae NOT DETECTED NOT DETECTED Final   Streptococcus pyogenes NOT  DETECTED NOT DETECTED Final   Acinetobacter baumannii NOT DETECTED NOT DETECTED Final   Enterobacteriaceae species NOT DETECTED NOT DETECTED Final   Enterobacter cloacae complex NOT DETECTED NOT DETECTED Final   Escherichia coli NOT DETECTED NOT DETECTED Final   Klebsiella oxytoca NOT DETECTED NOT DETECTED Final   Klebsiella pneumoniae NOT DETECTED NOT DETECTED Final   Proteus species NOT DETECTED NOT DETECTED Final   Serratia marcescens NOT DETECTED NOT DETECTED Final   Haemophilus influenzae NOT DETECTED NOT DETECTED Final   Neisseria meningitidis NOT DETECTED NOT DETECTED Final   Pseudomonas aeruginosa NOT DETECTED NOT DETECTED Final   Candida albicans NOT DETECTED NOT DETECTED Final   Candida glabrata NOT DETECTED NOT DETECTED Final   Candida krusei NOT DETECTED NOT DETECTED Final   Candida parapsilosis NOT DETECTED NOT DETECTED Final   Candida tropicalis NOT DETECTED NOT DETECTED Final  Culture, blood (Routine X 2) w Reflex to ID Panel     Status: Abnormal   Collection Time: 07/19/16 11:20 PM  Result Value Ref Range Status   Specimen Description BLOOD LEFT ARM  Final   Special Requests BOTTLES DRAWN AEROBIC AND ANAEROBIC 5ML  Final   Culture  Setup  Time   Final    GRAM POSITIVE COCCI IN CLUSTERS AEROBIC BOTTLE ONLY CRITICAL VALUE NOTED.  VALUE IS CONSISTENT WITH PREVIOUSLY REPORTED AND CALLED VALUE.    Culture STAPHYLOCOCCUS SPECIES (COAGULASE NEGATIVE) (A)  Final   Report Status 07/22/2016 FINAL  Final   Organism ID, Bacteria STAPHYLOCOCCUS SPECIES (COAGULASE NEGATIVE)  Final      Susceptibility   Staphylococcus species (coagulase negative) - MIC*    CIPROFLOXACIN <=0.5 SENSITIVE Sensitive     ERYTHROMYCIN >=8 RESISTANT Resistant     GENTAMICIN <=0.5 SENSITIVE Sensitive     OXACILLIN 0.5 RESISTANT Resistant     TETRACYCLINE >=16 RESISTANT Resistant     VANCOMYCIN 1 SENSITIVE Sensitive     TRIMETH/SULFA 80 RESISTANT Resistant     CLINDAMYCIN <=0.25 RESISTANT Resistant     RIFAMPIN <=0.5 SENSITIVE Sensitive     Inducible Clindamycin POSITIVE Resistant     * STAPHYLOCOCCUS SPECIES (COAGULASE NEGATIVE)  Urine culture     Status: Abnormal   Collection Time: 07/20/16  1:45 AM  Result Value Ref Range Status   Specimen Description URINE, RANDOM  Final   Special Requests NONE  Final   Culture MULTIPLE SPECIES PRESENT, SUGGEST RECOLLECTION (A)  Final   Report Status 07/21/2016 FINAL  Final  MRSA PCR Screening     Status: Abnormal   Collection Time: 07/20/16  7:15 AM  Result Value Ref Range Status   MRSA by PCR POSITIVE (A) NEGATIVE Final    Comment:        The GeneXpert MRSA Assay (FDA approved for NASAL specimens only), is one component of a comprehensive MRSA colonization surveillance program. It is not intended to diagnose MRSA infection nor to guide or monitor treatment for MRSA infections. RESULT CALLED TO, READ BACK BY AND VERIFIED WITH: ARNOLD,A RN AT 1006 ON 10.7.17 BY EPPERSON,S   Culture, Urine     Status: None   Collection Time: 07/22/16 10:21 AM  Result Value Ref Range Status   Specimen Description URINE, CATHETERIZED  Final   Special Requests NONE  Final   Culture NO GROWTH Performed at [facility]   Final   Report Status 07/23/2016 FINAL  Final  Culture, blood (Routine X 2) w Reflex to ID Panel     Status: None (Preliminary result)  Collection Time: 07/23/16 10:17 AM  Result Value Ref Range Status   Specimen Description BLOOD LEFT ARM  Final   Special Requests IN PEDIATRIC BOTTLE 3CC  Final   Culture   Final    NO GROWTH 3 DAYS Performed at [facility]    Report Status PENDING  Incomplete  Culture, blood (Routine X 2) w Reflex to ID Panel     Status: None (Preliminary result)   Collection Time: 07/23/16 10:19 AM  Result Value Ref Range Status   Specimen Description BLOOD LEFT HAND  Final   Special Requests IN PEDIATRIC BOTTLE 4CC  Final   Culture   Final    NO GROWTH 3 DAYS Performed at [facility]    Report Status PENDING  Incomplete     Radiology Studies: No results found.   Scheduled Meds: . aspirin  81 mg Oral Daily  . chlorhexidine gluconate (MEDLINE KIT)  15 mL Mouth Rinse BID  . heparin  5,000 Units Subcutaneous Q8H  . insulin aspart  0-15 Units Subcutaneous Q4H  . mouth rinse  15 mL Mouth Rinse QID  . sodium chloride flush  10-40 mL Intracatheter Q12H  . vancomycin  1,500 mg Intravenous Q24H   Continuous Infusions: . dextrose 40 mL/hr at 07/27/16 0600     LOS: 7 days   Time spent: > 35 minutes  Velvet Bathe, MD [facility] Pager [phone]  If 7PM-7AM, please contact night-coverage [email or web address] Password [email or web address] 07/27/2016, 9:58 AM

## 2016-07-28 DIAGNOSIS — I872 Venous insufficiency (chronic) (peripheral): Secondary | ICD-10-CM

## 2016-07-28 DIAGNOSIS — Z888 Allergy status to other drugs, medicaments and biological substances status: Secondary | ICD-10-CM

## 2016-07-28 DIAGNOSIS — L03116 Cellulitis of left lower limb: Secondary | ICD-10-CM

## 2016-07-28 DIAGNOSIS — J9601 Acute respiratory failure with hypoxia: Secondary | ICD-10-CM

## 2016-07-28 DIAGNOSIS — Z9981 Dependence on supplemental oxygen: Secondary | ICD-10-CM

## 2016-07-28 DIAGNOSIS — Z794 Long term (current) use of insulin: Secondary | ICD-10-CM

## 2016-07-28 DIAGNOSIS — B957 Other staphylococcus as the cause of diseases classified elsewhere: Secondary | ICD-10-CM

## 2016-07-28 DIAGNOSIS — Z89511 Acquired absence of right leg below knee: Secondary | ICD-10-CM

## 2016-07-28 DIAGNOSIS — Z7982 Long term (current) use of aspirin: Secondary | ICD-10-CM

## 2016-07-28 DIAGNOSIS — Z9989 Dependence on other enabling machines and devices: Secondary | ICD-10-CM

## 2016-07-28 DIAGNOSIS — Z79899 Other long term (current) drug therapy: Secondary | ICD-10-CM

## 2016-07-28 DIAGNOSIS — R7881 Bacteremia: Secondary | ICD-10-CM

## 2016-07-28 LAB — GLUCOSE, CAPILLARY
GLUCOSE-CAPILLARY: 158 mg/dL — AB (ref 65–99)
GLUCOSE-CAPILLARY: 146 mg/dL — AB (ref 65–99)
GLUCOSE-CAPILLARY: 173 mg/dL — AB (ref 65–99)
Glucose-Capillary: 150 mg/dL — ABNORMAL HIGH (ref 65–99)
Glucose-Capillary: 170 mg/dL — ABNORMAL HIGH (ref 65–99)
Glucose-Capillary: 181 mg/dL — ABNORMAL HIGH (ref 65–99)

## 2016-07-28 LAB — CULTURE, BLOOD (ROUTINE X 2)
CULTURE: NO GROWTH
CULTURE: NO GROWTH

## 2016-07-28 LAB — BASIC METABOLIC PANEL
Anion gap: 4 — ABNORMAL LOW (ref 5–15)
BUN: 25 mg/dL — AB (ref 6–20)
CHLORIDE: 104 mmol/L (ref 101–111)
CO2: 39 mmol/L — ABNORMAL HIGH (ref 22–32)
Calcium: 8.7 mg/dL — ABNORMAL LOW (ref 8.9–10.3)
Creatinine, Ser: 1.12 mg/dL — ABNORMAL HIGH (ref 0.44–1.00)
GFR calc Af Amer: 56 mL/min — ABNORMAL LOW (ref >60)
GFR calc non Af Amer: 48 mL/min — ABNORMAL LOW (ref >60)
GLUCOSE: 160 mg/dL — AB (ref 65–99)
POTASSIUM: 3.5 mmol/L (ref 3.5–5.1)
Sodium: 147 mmol/L — ABNORMAL HIGH (ref 135–145)

## 2016-07-28 LAB — VANCOMYCIN, TROUGH: Vancomycin Tr: 21 ug/mL (ref 15–20)

## 2016-07-28 MED ORDER — DULOXETINE HCL 20 MG PO CPEP
40.0000 mg | ORAL_CAPSULE | Freq: Every day | ORAL | Status: DC
  Administered 2016-07-28 – 2016-08-01 (×5): 40 mg via ORAL
  Filled 2016-07-28 (×5): qty 2

## 2016-07-28 MED ORDER — VANCOMYCIN HCL 10 G IV SOLR
1250.0000 mg | INTRAVENOUS | Status: DC
  Filled 2016-07-28: qty 1250

## 2016-07-28 NOTE — Progress Notes (Signed)
Pt wore BiPAP qhs for approximately 10 minutes before pulling mask off and refusing to put it back on.  Education provided to the Pt but still refuses.  RT will continue to monitor as needed.

## 2016-07-28 NOTE — Progress Notes (Addendum)
Pharmacy Antibiotic Note  Heather Zimmerman is a 70 y.o. female admitted on 07/19/2016 with AMS, SOB. Blood cx from 10/6 positive for 2/2 with CoNS. BCID shows no resistance patterns. However, blood cultures do indicate oxacillin resistance. Repeat blood cultures on 10/10 with no growth to date.  Today, 07/28/2016: Day #7 Vancomycin  Renal : CKD, SCr has trended up. CrCl (Normalized) ~ 43 ml/min/1.74m2  WBC WNL  Afebrile  Plan:  Recheck Vanc trough today at 1130.   Per discussion with CCM, plan was to stop Vanc after 7 days.   Follow renal function, repeat blood cultures, and clinical course.   Height: 5\' 2"  (157.5 cm) Weight: (!) 315 lb (142.9 kg) IBW/kg (Calculated) : 50.1  Temp (24hrs), Avg:98.8 F (37.1 C), Min:98.2 F (36.8 C), Max:99.1 F (37.3 C)   Recent Labs Lab 07/22/16 0400 07/23/16 0418 07/24/16 0427 07/25/16 0300 07/25/16 0845 07/26/16 0428 07/27/16 0400  WBC 7.5 6.8 7.2 7.4  --   --  6.8  CREATININE 1.40* 1.41* 1.27* 1.27*  --  1.23* 1.36*  VANCOTROUGH  --   --   --   --  21*  --   --     Estimated Creatinine Clearance: 52.2 mL/min (by C-G formula based on SCr of 1.36 mg/dL (H)).    Allergies  Allergen Reactions  . Ace Inhibitors     unknown    Antimicrobials this admission: 10/7 >> Ceftriaxone x 1, resume 10/8 >>10/9 10/7 >> Ceftazidime >> 10/8 10/9 >> Vanc >>  Microbiology results: 10/6 BCx: 2/2 CoNS - sens Cipro, Gent, Rifampin, Vanc (BCID staph species with no resistance, but Blood Cx R-oxacillin) 10/7 UCx: multiple, suggest recollection 10/7 MRSA PCR: positive 10/9 UCx (repeat): NGF 10/10 BCx (repeat): NGTD  Thank you for allowing pharmacy to be a part of this patient's care.  Romeo Rabon, PharmD, pager [phone]. 07/28/2016,10:26 AM.  Addendum: Vanc Trough = 43mcg/ml on 1500mg  [id number]. Goal 15-49mcg/ml. Will reduce Vanc to 1250mg  q24h.  Romeo Rabon, PharmD, pager [phone]. 07/28/2016,1:06 PM.

## 2016-07-28 NOTE — Evaluation (Signed)
Physical Therapy Evaluation Patient Details Name: Heather Roark. Zimmerman MRN: [id number] DOB: [birth year] Today's Date: 07/28/2016   History of Present Illness  Pt admitted from SNF with acute resp failure and hx of CHF, DM, L BKA, and chronic LE venous stasis ulcers  Clinical Impression  Pt admitted as above and presenting with functional mobility limitations 2* generalized weakness, morbid obesity and balance deficits.  Pt will require follow up rehab at SNF level.    Follow Up Recommendations SNF    Equipment Recommendations  None recommended by PT    Recommendations for Other Services       Precautions / Restrictions Precautions Precautions: Fall Restrictions Weight Bearing Restrictions: No      Mobility  Bed Mobility Overal bed mobility: Needs Assistance Bed Mobility: Supine to Sit;Sit to Supine     Supine to sit: Min assist;Mod assist;+2 for safety/equipment;+2 for physical assistance Sit to supine: Min assist;Mod assist;+2 for physical assistance;+2 for safety/equipment   General bed mobility comments: cues for sequence and assist to manouver LEs and bring trunk to upright. Pt utilizing bed rails to self assist  Transfers                 General transfer comment: NT - Pt unable to stand 2* R BKA and ltd strength L LE combined with morbid obesity.  Pt utilizes mechanical lift at SNF and will require same continuing on  Ambulation/Gait             General Gait Details: Pt is nonambulatory  Stairs            Wheelchair Mobility    Modified Rankin (Stroke Patients Only)       Balance Overall balance assessment: Needs assistance Sitting-balance support: Feet supported;No upper extremity supported Sitting balance-Leahy Scale: Fair Sitting balance - Comments: Pt balanced at EOB with Sup and intermittent use of UEs x 15 min                                     Pertinent Vitals/Pain Pain Assessment: No/denies pain    Home Living  Family/patient expects to be discharged to:: Skilled nursing facility                      Prior Function Level of Independence: Needs assistance   Gait / Transfers Assistance Needed: Pt reports that the staff at SNF used [place] to get her up from bed to W/C; BKA from 2013, pt reports she never had prosthesis due to her weight  ADL's / Homemaking Assistance Needed: Pt reports she could do her UBB/D sitting EOB and that staff mainly helped her with LBB/D        Hand Dominance   Dominant Hand: Left    Extremity/Trunk Assessment   Upper Extremity Assessment: Generalized weakness           Lower Extremity Assessment: Generalized weakness;RLE deficits/detail RLE Deficits / Details: R BKA       Communication   Communication: Other (comment) (Speech difficult to understand at times)  Cognition Arousal/Alertness: Awake/alert Behavior During Therapy: WFL for tasks assessed/performed Overall Cognitive Status: Within Functional Limits for tasks assessed                      General Comments      Exercises     Assessment/Plan    PT Assessment Patient needs continued  PT services  PT Problem List Decreased strength;Decreased activity tolerance;Decreased balance;Decreased mobility;Obesity          PT Treatment Interventions Functional mobility training;Therapeutic activities;Therapeutic exercise;Balance training;Patient/family education    PT Goals (Current goals can be found in the Care Plan section)  Acute Rehab PT Goals Patient Stated Goal: Find a different SNF, "they dont treat people very nice there" PT Goal Formulation: With patient Time For Goal Achievement: 08/13/16 Potential to Achieve Goals: Fair    Frequency Min 3X/week   Barriers to discharge        Co-evaluation               End of Session Equipment Utilized During Treatment: Oxygen Activity Tolerance: Patient tolerated treatment well Patient left: in bed;with call  bell/phone within reach Nurse Communication: Mobility status         Time: [id number] PT Time Calculation (min) (ACUTE ONLY): 40 min   Charges:   PT Evaluation $PT Eval Low Complexity: 1 Procedure PT Treatments $Therapeutic Activity: 23-37 mins   PT G Codes:        Cheikh Bramble Aug 25, 2016, 1:26 PM

## 2016-07-28 NOTE — Progress Notes (Signed)
PROGRESS NOTE    Heather Zimmerman. Heather Zimmerman  ZWC:[id number] DOB: 03/31/1946 DOA: 07/19/2016 PCP: Gildardo Cranker, DO    Brief Narrative:  70 yo female presented with dyspnea and altered mental status with VDRF from acute on chronic hypoxic/hypercapnic respiratory failure in setting of OSA/OHS, acute on chronic diastolic CHF.  Found to have sepsis with coag negative Staph bacteremia, Lt lower leg cellulitis.   Assessment & Plan:  Acute on chronic hypercarbic respiratory failure. OSA/OHS. P:   Needs Bipap at night and prn during the day Continue Oxygen to keep SpO2 90 to 95%   Acute pulmonary edema >> improved. Acute on chronic diastolic CHF. Hx of PVD. P:  Continue holding lasix with elevated Na Monitor hemodynamics ASA continued  Hypernatremia. P:   D5W at 40 ml/hr, Sodium coming down slowly. Will continue gentle fluid rehydration. Continue holding lasix F/u BMET  Morbid Obesity. Dysphagia. P:   F/u with speech therapy >> dysphagia 2 diet. Nutrition assessment once more stable  Anemia of critical illness. P:  F/u CBC intermittently, on last check 10.4 (10/14) SQ heparin for DVT prophylaxis  Sepsis from Lt lower leg cellulitis with coag negative Staph bacteremia (present on admission) P:   Day 7 of vancomycin F/u repeat blood culture from 10/10 Wound care Consult ID  DM type II. P:   SSI  Acute metabolic encephalopathy >> improved. Hx of depression, chronic pain, DM neuropathy. P:   Hold outpt neurontin, continued cymbalta.  Deconditioning. P: PT/OT on board  DVT prophylaxis: Heparin Code Status: Full Family Communication:  Disposition Plan: transfer to telemetry   Consultants:   Consult ID   Procedures: s/p intubation   Antimicrobials: Vancomycin   Subjective: Pt states that she feels better today.  Objective: Vitals:   07/28/16 0400 07/28/16 0408 07/28/16 0500 07/28/16 0600  BP: 112/87   (!) 111/59  Pulse:  80    Resp: (!) _0 Temp: 98.8 F (37.1 C)  98.8 F (37.1 C) 99 F (37.2 C)  TempSrc:      SpO2: 100% 98% 100% 100%  Weight:   (!) 142.9 kg (315 lb)   Height:        Intake/Output Summary (Last 24 hours) at 07/28/16 1008 Last data filed at 07/28/16 0527  Gross per 24 hour  Intake             1545 ml  Output             1226 ml  Net              319 ml   Filed Weights   07/26/16 0500 07/27/16 0436 07/28/16 0500  Weight: (!) 143.3 kg (316 lb) (!) 144.7 kg (319 lb) (!) 142.9 kg (315 lb)    Examination:  General exam: Appears calm and comfortable, in nad. Respiratory system: Clear to auscultation. Respiratory effort normal. Equal chest rise. Cardiovascular system: S1 & S2 heard, RRR. No JVD, murmurs, rubs, gallops or clicks. No pedal edema. Gastrointestinal system: Abdomen is nondistended, soft and nontender. No organomegaly or masses felt. Normal bowel sounds heard. Central nervous system: Alert and oriented. No focal neurological deficits. Extremities: Symmetric 5 x 5 power. Skin: No rashes, lesions or ulcers, on limited exam. Psychiatry: Judgement and insight appear normal. Mood & affect appropriate.   Data Reviewed: I have personally reviewed following labs and imaging studies  CBC:  Recent Labs Lab 07/22/16 0400 07/23/16 0418 07/24/16 0427 07/25/16 0300 07/27/16 0400  WBC 7.5 6.8  7.2 7.4 6.8  NEUTROABS 4.9 4.1  --   --   --   HGB 10.0* 10.1* 10.7* 10.3* 10.4*  HCT 36.0 35.9* 39.2 38.0 37.8  MCV 95.0 93.2 96.6 96.4 94.3  PLT 191 191 197 200 210   Basic Metabolic Panel:  Recent Labs Lab 07/23/16 0418 07/24/16 0427 07/25/16 0300 07/26/16 0428 07/27/16 0400  NA 150* 152* 154* 153* 152*  K 3.7 3.6 3.7 4.0 3.6  CL 97* 100* 103 105 105  CO2 47* 45* 45* 42* 42*  GLUCOSE 159* 192* 213* 191* 182*  BUN 40* 38* 37* 34* 37*  CREATININE 1.41* 1.27* 1.27* 1.23* 1.36*  CALCIUM 9.0 9.3 9.5 9.0 9.0   GFR: Estimated Creatinine Clearance: 52.2 mL/min (by C-G formula based on SCr of  1.36 mg/dL (H)). Liver Function Tests: No results for input(s): AST, ALT, ALKPHOS, BILITOT, PROT, ALBUMIN in the last 168 hours. No results for input(s): LIPASE, AMYLASE in the last 168 hours. No results for input(s): AMMONIA in the last 168 hours. Coagulation Profile: No results for input(s): INR, PROTIME in the last 168 hours. Cardiac Enzymes: No results for input(s): CKTOTAL, CKMB, CKMBINDEX, TROPONINI in the last 168 hours. BNP (last 3 results) No results for input(s): PROBNP in the last 8760 hours. HbA1C: No results for input(s): HGBA1C in the last 72 hours. CBG:  Recent Labs Lab 07/27/16 1609 07/27/16 1959 07/28/16 0002 07/28/16 0510 07/28/16 0741  GLUCAP 229* 192* 170* 158* 146*   Lipid Profile: No results for input(s): CHOL, HDL, LDLCALC, TRIG, CHOLHDL, LDLDIRECT in the last 72 hours. Thyroid Function Tests: No results for input(s): TSH, T4TOTAL, FREET4, T3FREE, THYROIDAB in the last 72 hours. Anemia Panel: No results for input(s): VITAMINB12, FOLATE, FERRITIN, TIBC, IRON, RETICCTPCT in the last 72 hours. Sepsis Labs:  Recent Labs Lab 07/22/16 0400  PROCALCITON <0.10    Recent Results (from the past 240 hour(s))  Culture, blood (Routine X 2) w Reflex to ID Panel     Status: Abnormal   Collection Time: 07/19/16 11:18 PM  Result Value Ref Range Status   Specimen Description BLOOD RIGHT FOREARM  Final   Special Requests BOTTLES DRAWN AEROBIC AND ANAEROBIC  Final   Culture  Setup Time   Final    GRAM POSITIVE COCCI IN CLUSTERS ANAEROBIC BOTTLE ONLY CRITICAL RESULT CALLED TO, READ BACK BY AND VERIFIED WITH: B GREEN,PHARMD AT 1833 07/20/16 BY L BENFIELD    Culture (A)  Final    STAPHYLOCOCCUS SPECIES (COAGULASE NEGATIVE) SUSCEPTIBILITIES PERFORMED ON PREVIOUS CULTURE WITHIN THE LAST 5 DAYS.    Report Status 07/23/2016 FINAL  Final  Blood Culture ID Panel (Reflexed)     Status: Abnormal   Collection Time: 07/19/16 11:18 PM  Result Value Ref Range Status    Enterococcus species NOT DETECTED NOT DETECTED Final   Listeria monocytogenes NOT DETECTED NOT DETECTED Final   Staphylococcus species DETECTED (A) NOT DETECTED Final    Comment: CRITICAL RESULT CALLED TO, READ BACK BY AND VERIFIED WITH: B GREEN,PHARMD AT 1833 07/20/16 BY L BENFIELD    Staphylococcus aureus NOT DETECTED NOT DETECTED Final   Methicillin resistance NOT DETECTED NOT DETECTED Final   Streptococcus species NOT DETECTED NOT DETECTED Final   Streptococcus agalactiae NOT DETECTED NOT DETECTED Final   Streptococcus pneumoniae NOT DETECTED NOT DETECTED Final   Streptococcus pyogenes NOT DETECTED NOT DETECTED Final   Acinetobacter baumannii NOT DETECTED NOT DETECTED Final   Enterobacteriaceae species NOT DETECTED NOT DETECTED Final   Enterobacter cloacae  complex NOT DETECTED NOT DETECTED Final   Escherichia coli NOT DETECTED NOT DETECTED Final   Klebsiella oxytoca NOT DETECTED NOT DETECTED Final   Klebsiella pneumoniae NOT DETECTED NOT DETECTED Final   Proteus species NOT DETECTED NOT DETECTED Final   Serratia marcescens NOT DETECTED NOT DETECTED Final   Haemophilus influenzae NOT DETECTED NOT DETECTED Final   Neisseria meningitidis NOT DETECTED NOT DETECTED Final   Pseudomonas aeruginosa NOT DETECTED NOT DETECTED Final   Candida albicans NOT DETECTED NOT DETECTED Final   Candida glabrata NOT DETECTED NOT DETECTED Final   Candida krusei NOT DETECTED NOT DETECTED Final   Candida parapsilosis NOT DETECTED NOT DETECTED Final   Candida tropicalis NOT DETECTED NOT DETECTED Final  Culture, blood (Routine X 2) w Reflex to ID Panel     Status: Abnormal   Collection Time: 07/19/16 11:20 PM  Result Value Ref Range Status   Specimen Description BLOOD LEFT ARM  Final   Special Requests BOTTLES DRAWN AEROBIC AND ANAEROBIC 5ML  Final   Culture  Setup Time   Final    GRAM POSITIVE COCCI IN CLUSTERS AEROBIC BOTTLE ONLY CRITICAL VALUE NOTED.  VALUE IS CONSISTENT WITH PREVIOUSLY REPORTED AND  CALLED VALUE.    Culture STAPHYLOCOCCUS SPECIES (COAGULASE NEGATIVE) (A)  Final   Report Status 07/22/2016 FINAL  Final   Organism ID, Bacteria STAPHYLOCOCCUS SPECIES (COAGULASE NEGATIVE)  Final      Susceptibility   Staphylococcus species (coagulase negative) - MIC*    CIPROFLOXACIN <=0.5 SENSITIVE Sensitive     ERYTHROMYCIN >=8 RESISTANT Resistant     GENTAMICIN <=0.5 SENSITIVE Sensitive     OXACILLIN 0.5 RESISTANT Resistant     TETRACYCLINE >=16 RESISTANT Resistant     VANCOMYCIN 1 SENSITIVE Sensitive     TRIMETH/SULFA 80 RESISTANT Resistant     CLINDAMYCIN <=0.25 RESISTANT Resistant     RIFAMPIN <=0.5 SENSITIVE Sensitive     Inducible Clindamycin POSITIVE Resistant     * STAPHYLOCOCCUS SPECIES (COAGULASE NEGATIVE)  Urine culture     Status: Abnormal   Collection Time: 07/20/16  1:45 AM  Result Value Ref Range Status   Specimen Description URINE, RANDOM  Final   Special Requests NONE  Final   Culture MULTIPLE SPECIES PRESENT, SUGGEST RECOLLECTION (A)  Final   Report Status 07/21/2016 FINAL  Final  MRSA PCR Screening     Status: Abnormal   Collection Time: 07/20/16  7:15 AM  Result Value Ref Range Status   MRSA by PCR POSITIVE (A) NEGATIVE Final    Comment:        The GeneXpert MRSA Assay (FDA approved for NASAL specimens only), is one component of a comprehensive MRSA colonization surveillance program. It is not intended to diagnose MRSA infection nor to guide or monitor treatment for MRSA infections. RESULT CALLED TO, READ BACK BY AND VERIFIED WITH: ARNOLD,A RN AT 1006 ON 10.7.17 BY EPPERSON,S   Culture, Urine     Status: None   Collection Time: 07/22/16 10:21 AM  Result Value Ref Range Status   Specimen Description URINE, CATHETERIZED  Final   Special Requests NONE  Final   Culture NO GROWTH Performed at [facility]   Final   Report Status 07/23/2016 FINAL  Final  Culture, blood (Routine X 2) w Reflex to ID Panel     Status: None (Preliminary result)    Collection Time: 07/23/16 10:17 AM  Result Value Ref Range Status   Specimen Description BLOOD LEFT ARM  Final   Special Requests  IN PEDIATRIC BOTTLE 3CC  Final   Culture   Final    NO GROWTH 4 DAYS Performed at [facility]    Report Status PENDING  Incomplete  Culture, blood (Routine X 2) w Reflex to ID Panel     Status: None (Preliminary result)   Collection Time: 07/23/16 10:19 AM  Result Value Ref Range Status   Specimen Description BLOOD LEFT HAND  Final   Special Requests IN PEDIATRIC BOTTLE 4CC  Final   Culture   Final    NO GROWTH 4 DAYS Performed at [facility]    Report Status PENDING  Incomplete     Radiology Studies: No results found.   Scheduled Meds: . aspirin  81 mg Oral Daily  . chlorhexidine gluconate (MEDLINE KIT)  15 mL Mouth Rinse BID  . heparin  5,000 Units Subcutaneous Q8H  . insulin aspart  0-15 Units Subcutaneous Q4H  . mouth rinse  15 mL Mouth Rinse QID  . sodium chloride flush  10-40 mL Intracatheter Q12H  . vancomycin  1,500 mg Intravenous Q24H   Continuous Infusions: . dextrose 40 mL/hr at 07/27/16 1245     LOS: 8 days   Time spent: > 35 minutes  Velvet Bathe, MD [facility] Pager [phone]  If 7PM-7AM, please contact night-coverage [email or web address] Password [email or web address] 07/28/2016, 10:08 AM

## 2016-07-28 NOTE — Progress Notes (Signed)
Speech Language Pathology Treatment: Dysphagia  Patient Details Name: Shantanique Gambell. Retallick MRN: [id number] DOB: 1946-02-11 Today's Date: 07/28/2016 Time: [id number] SLP Time Calculation (min) (ACUTE ONLY): 30 min  Assessment / Plan / Recommendation Clinical Impression  This afternoon, Ms Demetrius Revel is drowsy but awoke to SlP verbal/tactile stimulation.  She can phonate well today thankfully but is significantly more dysarthric than observed on Thursday Oct 12th.  Intake listed in chart as 100%.  SLP assisted pt to consume soda, applesauce and crackers.  Slow mastication of solids observed due to weakness.   Good tolerance of liquids (mild weakness in forming suction on straw observed x2) but without overt airway compromise. Recommend to continue diet with very strict precautions - only feeding pt when full alert.  Suspect vocal fold closure is adequate and modification indicated for oral deficits from weakness.  Will follow for skilled SLP dysphagia treatment to maximize nutrition with Lease Restrict Diet and maximal airway protection, advised pt to plan and she agreed.    HPI HPI: 70 yo female adm to [facility] with AMS, hypoxia, required intubation.  PMH + for CHF, DM type 2, sleep apnea, hypoventilation syndrome, obesity, chronic venous stasis.  Pt was extubated today at 0915 am and swallow evaluation ordered.  CXR showed no airspace consolidation.        SLP Plan  Continue with current plan of care     Recommendations  Diet recommendations: Dysphagia 2 (fine chop);Thin liquid Liquids provided via: Cup;Straw Medication Administration: Whole meds with liquid (or with puree if problematic or lethargic) Supervision: Patient able to self feed Compensations: Slow rate;Small sips/bites;Follow solids with liquid Postural Changes and/or Swallow Maneuvers: Seated upright 90 degrees;Upright 30-60 min after meal                Oral Care Recommendations: Oral care BID Follow up Recommendations: None Plan:  Continue with current plan of care       [facility], [place], [place] [facility] SLP [phone]

## 2016-07-28 NOTE — H&P (Signed)
[facility] for Infectious Disease  Total days of antibiotics 9        Day 7 vancomycin               Reason for Consult: CoNS bacteremia   Referring Physician: vega  Active Problems:   Respiratory failure ([place])   Acute respiratory failure with hypercapnia (HCC)    HPI: Heather Zimmerman. Heather Zimmerman is a 70 y.o. 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 She was found to be hypoxic with O2 sats 63% by EMS report but improved to 99% on non-rebreather. Per ED, initial GCS was 8, and patient was tachypneic with accessory muscle use.  She was placed on bipap but continued to have increased WOB and decreased alertness so was intubated in the ED.  CXR with evidence of pulmonary edema. She received diuretics and D50 since BS were in the 40s. She was afebrile and no leukocytosis to suggest infection. ABG showed severe compensated respiratory acidosis with pH 7.3, pCO2 106, pO2 77.2, bicarb 50.6  After intubation. In addition to respiratory distress, her physical exam was also remarkable for weeping left leg ulcer, concern for cellulitis. She did initial have blood cultures drawn for 2/4 bottles (aerobic only) grew CoNS(oxa R). She was treated started on vancomycin in addition to management of VDRF.  She is now extubated receiving BIPAP at night and supplemental oxygen in the day time. She is vancomycin Day #7, and ID asked to weigh in on length of treatment course.  Past Medical History:  Diagnosis Date  . Chronic diastolic heart failure ([place]) 12/31/2012  . Chronic pain 12/31/2012  . Diabetes mellitus without complication ([place])    TYPE 2  . Essential hypertension, benign 12/31/2012  . GERD 12/31/2012  . Obstructive sleep apnea 07/05/2014  . Shortness of breath dyspnea   . Type II or unspecified type diabetes mellitus with peripheral circulatory disorders, not  stated as uncontrolled(250.70) 12/31/2012  . Unspecified vitamin D deficiency 12/31/2012  . Venous insufficiency (chronic) (peripheral)   . Venous stasis ulcers (HCC)     Allergies:  Allergies  Allergen Reactions  . Ace Inhibitors     unknown     MEDICATIONS: . aspirin  81 mg Oral Daily  . DULoxetine  40 mg Oral Daily  . heparin  5,000 Units Subcutaneous Q8H  . insulin aspart  0-15 Units Subcutaneous Q4H  . sodium chloride flush  10-40 mL Intracatheter Q12H  . [START ON 07/29/2016] vancomycin  1,250 mg Intravenous Q24H    Social History  Substance Use Topics  . Smoking status: Never Smoker  . Smokeless tobacco: Never Used  . Alcohol use No    Family History  Problem Relation Age of Onset  . Hypertension Other      Review of Systems  Constitutional: Negative for fever, chills, diaphoresis, activity change, appetite change, fatigue and unexpected weight change.  HENT: Negative for congestion, sore throat, rhinorrhea, sneezing, trouble swallowing and sinus pressure.  Eyes: Negative for photophobia and visual disturbance.  Respiratory: Negative for cough, chest tightness, shortness of breath, wheezing and stridor.  Cardiovascular: Negative for chest pain, palpitations and leg swelling.  Gastrointestinal: Negative for nausea, vomiting, abdominal pain, diarrhea, constipation, blood in stool, abdominal distention and anal bleeding.  Genitourinary: Negative for dysuria, hematuria, flank pain and difficulty urinating.  Musculoskeletal: Negative for  myalgias, back pain, joint swelling, arthralgias and gait problem.  Skin: Negative for color change, pallor, rash and wound.  Neurological: Negative for dizziness, tremors, weakness and light-headedness.  Hematological: Negative for adenopathy. Does not bruise/bleed easily.  Psychiatric/Behavioral: Negative for behavioral problems, confusion, sleep disturbance, dysphoric mood, decreased concentration and agitation.      OBJECTIVE: Temp:  [97.6 F (36.4 C)-99 F (37.2 C)] 97.6 F (36.4 C) (10/15 1413) Pulse Rate:  [69-80] 69 (10/15 1413) Resp:  [10-26] 16 (10/15 1413) BP: (103-127)/(44-110) 112/51 (10/15 1413) SpO2:  [96 %-100 %] 100 % (10/15 1413) FiO2 (%):  [30 %] 30 % (10/15 0408) Weight:  [315 lb (142.9 kg)-331 lb (150.1 kg)] 331 lb (150.1 kg) (10/15 1413) Physical Exam  Constitutional:  oriented to person, place, and time. appears well-developed and well-nourished. Obese female in No distress.  HENT: [place]/AT, PERRLA, no scleral icterus Mouth/Throat: Oropharynx is clear and moist. No oropharyngeal exudate.  Cardiovascular: Normal rate, regular rhythm and normal heart sounds. Exam reveals no gallop and no friction rub.  No murmur heard.  Pulmonary/Chest: Effort normal and breath sounds normal. No respiratory distress.  has no wheezes.  Neck = supple, no nuchal rigidity Abdominal: Soft. Bowel sounds are normal.  exhibits no distension. There is no tenderness.  Lymphadenopathy: no cervical adenopathy. No axillary adenopathy Ext: right BKA, left leg has signs of chronic lymphadema and venous stasis changes. Hypopigmentation to left lower leg with shallow wound bed ulcers. No purulent drainage Neurological: alert and oriented to person, place, and time.  Skin: Skin is warm and dry. No rash noted. No erythema.  Psychiatric: a normal mood and affect.  behavior is normal.    LABS: Results for orders placed or performed during the hospital encounter of 07/19/16 (from the past 48 hour(s))  Glucose, capillary     Status: Abnormal   Collection Time: 07/26/16  4:40 PM  Result Value Ref Range   Glucose-Capillary 176 (H) 65 - 99 mg/dL   Comment 1 Notify RN    Comment 2 Document in Chart   Glucose, capillary     Status: Abnormal   Collection Time: 07/26/16  8:13 PM  Result Value Ref Range   Glucose-Capillary 198 (H) 65 - 99 mg/dL  Glucose, capillary     Status: Abnormal   Collection Time: 07/27/16  12:42 AM  Result Value Ref Range   Glucose-Capillary 189 (H) 65 - 99 mg/dL  Basic metabolic panel     Status: Abnormal   Collection Time: 07/27/16  4:00 AM  Result Value Ref Range   Sodium 152 (H) 135 - 145 mmol/L   Potassium 3.6 3.5 - 5.1 mmol/L   Chloride 105 101 - 111 mmol/L   CO2 42 (H) 22 - 32 mmol/L   Glucose, Bld 182 (H) 65 - 99 mg/dL   BUN 37 (H) 6 - 20 mg/dL   Creatinine, Ser 1.36 (H) 0.44 - 1.00 mg/dL   Calcium 9.0 8.9 - 10.3 mg/dL   GFR calc non Af Amer 38 (L) >60 mL/min   GFR calc Af Amer 44 (L) >60 mL/min    Comment: (NOTE) The eGFR has been calculated using the CKD EPI equation. This calculation has not been validated in all clinical situations. eGFR's persistently <60 mL/min signify possible Chronic Kidney Disease.    Anion gap 5 5 - 15  CBC     Status: Abnormal   Collection Time: 07/27/16  4:00 AM  Result Value Ref Range   WBC 6.8 4.0 - 10.5  K/uL   RBC 4.01 3.87 - 5.11 MIL/uL   Hemoglobin 10.4 (L) 12.0 - 15.0 g/dL   HCT 37.8 36.0 - 46.0 %   MCV 94.3 78.0 - 100.0 fL   MCH 25.9 (L) 26.0 - 34.0 pg   MCHC 27.5 (L) 30.0 - 36.0 g/dL   RDW 19.0 (H) 11.5 - 15.5 %   Platelets 210 150 - 400 K/uL  Glucose, capillary     Status: Abnormal   Collection Time: 07/27/16  4:33 AM  Result Value Ref Range   Glucose-Capillary 168 (H) 65 - 99 mg/dL  Glucose, capillary     Status: Abnormal   Collection Time: 07/27/16  7:40 AM  Result Value Ref Range   Glucose-Capillary 171 (H) 65 - 99 mg/dL  Glucose, capillary     Status: Abnormal   Collection Time: 07/27/16 12:21 PM  Result Value Ref Range   Glucose-Capillary 218 (H) 65 - 99 mg/dL   Comment 1 Notify RN    Comment 2 Document in Chart   Glucose, capillary     Status: Abnormal   Collection Time: 07/27/16  4:09 PM  Result Value Ref Range   Glucose-Capillary 229 (H) 65 - 99 mg/dL  Glucose, capillary     Status: Abnormal   Collection Time: 07/27/16  7:59 PM  Result Value Ref Range   Glucose-Capillary 192 (H) 65 - 99 mg/dL   Glucose, capillary     Status: Abnormal   Collection Time: 07/28/16 12:02 AM  Result Value Ref Range   Glucose-Capillary 170 (H) 65 - 99 mg/dL   Comment 1 Document in Chart   Glucose, capillary     Status: Abnormal   Collection Time: 07/28/16  5:10 AM  Result Value Ref Range   Glucose-Capillary 158 (H) 65 - 99 mg/dL  Glucose, capillary     Status: Abnormal   Collection Time: 07/28/16  7:41 AM  Result Value Ref Range   Glucose-Capillary 146 (H) 65 - 99 mg/dL   Comment 1 Notify RN    Comment 2 Document in Chart   Basic metabolic panel     Status: Abnormal   Collection Time: 07/28/16  9:34 AM  Result Value Ref Range   Sodium 147 (H) 135 - 145 mmol/L   Potassium 3.5 3.5 - 5.1 mmol/L   Chloride 104 101 - 111 mmol/L   CO2 39 (H) 22 - 32 mmol/L   Glucose, Bld 160 (H) 65 - 99 mg/dL   BUN 25 (H) 6 - 20 mg/dL   Creatinine, Ser 1.12 (H) 0.44 - 1.00 mg/dL   Calcium 8.7 (L) 8.9 - 10.3 mg/dL   GFR calc non Af Amer 48 (L) >60 mL/min   GFR calc Af Amer 56 (L) >60 mL/min    Comment: (NOTE) The eGFR has been calculated using the CKD EPI equation. This calculation has not been validated in all clinical situations. eGFR's persistently <60 mL/min signify possible Chronic Kidney Disease.    Anion gap 4 (L) 5 - 15  Vancomycin, trough     Status: Abnormal   Collection Time: 07/28/16 11:16 AM  Result Value Ref Range   Vancomycin Tr 21 (HH) 15 - 20 ug/mL    Comment: CRITICAL RESULT CALLED TO, READ BACK BY AND VERIFIED WITH: COLLINS,A RN AT 1230 ON 10.15.17 BY EPPERSON,S   Glucose, capillary     Status: Abnormal   Collection Time: 07/28/16 11:45 AM  Result Value Ref Range   Glucose-Capillary 150 (H) 65 - 99 mg/dL  MICRO: 10/6 CoNS in 2 of 4 bottles 10/10 blood cx NGTD IMAGING: TTE negative for vegetation  Assessment/Plan:  70yo F with brittle diabetes, OSA, OHS, dCHF presents with respiratory failure requiring intubation. Concurrently found to have draining left leg wound and CoNS  bacteremia   bacteremia = finish course of abtx today.  supratherapeutic vancomycin = recommend to stop IV Vancomycin and she has sufficient amounts systemically to treat tail end for cellulitis  Chronic venous stasis changes/ulcers= continue with current wound care treatment  No need for further abtx  Will sign off. If further questions, please contact Dr. Linus Salmons who is on next week.

## 2016-07-29 LAB — GLUCOSE, CAPILLARY
GLUCOSE-CAPILLARY: 120 mg/dL — AB (ref 65–99)
GLUCOSE-CAPILLARY: 128 mg/dL — AB (ref 65–99)
GLUCOSE-CAPILLARY: 167 mg/dL — AB (ref 65–99)
GLUCOSE-CAPILLARY: 193 mg/dL — AB (ref 65–99)
GLUCOSE-CAPILLARY: 97 mg/dL (ref 65–99)
Glucose-Capillary: 134 mg/dL — ABNORMAL HIGH (ref 65–99)
Glucose-Capillary: 136 mg/dL — ABNORMAL HIGH (ref 65–99)

## 2016-07-29 MED ORDER — PREMIER PROTEIN SHAKE
11.0000 [oz_av] | Freq: Two times a day (BID) | ORAL | Status: DC
  Administered 2016-07-29 – 2016-08-01 (×2): 11 [oz_av] via ORAL
  Filled 2016-07-29: qty 325.31

## 2016-07-29 NOTE — Progress Notes (Signed)
Paged the oncall Dr. @ [phone] To report a Bp of 120/49 no call back @ this time. Will report to day shift RN.

## 2016-07-29 NOTE — Progress Notes (Signed)
PROGRESS NOTE    Heather Zimmerman. [name]  [id number] DOB: 1945/12/11 DOA: 07/19/2016 PCP: Gildardo Cranker, DO    Brief Narrative:  70 yo female presented with dyspnea and altered mental status with VDRF from acute on chronic hypoxic/hypercapnic respiratory failure in setting of OSA/OHS, acute on chronic diastolic CHF.  Found to have sepsis with coag negative Staph bacteremia, Lt lower leg cellulitis.   Assessment & Plan:  Acute on chronic hypercarbic respiratory failure. OSA/OHS. P:   Needs Bipap at night and prn during the day Continue Oxygen to keep SpO2 90 to 95%   Acute pulmonary edema >> improved. Acute on chronic diastolic CHF. Hx of PVD. P:  Continue holding lasix with elevated Na, trending down. Will reassess next am. May have to add again. Will reassess next am. Monitor hemodynamics ASA continued  Hypernatremia. P:   Saline lock and monitor Continue holding lasix F/u BMET  Morbid Obesity. Dysphagia. P:   F/u with speech therapy >> dysphagia 2 diet. Nutrition assessment once more stable  Anemia of critical illness. P:  F/u CBC intermittently, on last check 10.4 (10/14) SQ heparin for DVT prophylaxis  Sepsis from Lt lower leg cellulitis with coag negative Staph bacteremia (present on admission) P:   Antibiotics per ID. F/u repeat blood culture from 10/10 Wound care Consulted ID, further work up to be determined by them.  DM type II. P:   SSI  Acute metabolic encephalopathy >> improved. Hx of depression, chronic pain, DM neuropathy. P:   Hold outpt neurontin, continued cymbalta.  Deconditioning. P: PT/OT on board  DVT prophylaxis: Heparin Code Status: Full Family Communication:  Disposition Plan: transfer to telemetry   Consultants:   Consult ID   Procedures: s/p intubation   Antimicrobials: Vancomycin   Subjective: Pt states that she feels better today. No new complaints  Objective: Vitals:   07/28/16 2130 07/29/16 0415  07/29/16 0500 07/29/16 1512  BP: (!) 132/59 (!) 120/49  132/64  Pulse: 66 69  70  Resp: 16 17  18   Temp: 97.9 F (36.6 C) 98.6 F (37 C)  98.8 F (37.1 C)  TempSrc: Oral Oral  Oral  SpO2: 98% 99%  99%  Weight:    (!) 137.9 kg (304 lb)  Height:   4\' 9"  (1.448 m)     Intake/Output Summary (Last 24 hours) at 07/29/16 1515 Last data filed at 07/29/16 0954  Gross per 24 hour  Intake              360 ml  Output              401 ml  Net              -41 ml   Filed Weights   07/28/16 0500 07/28/16 1413 07/29/16 1512  Weight: (!) 142.9 kg (315 lb) (!) 150.1 kg (331 lb) (!) 137.9 kg (304 lb)    Examination:  General exam: Appears calm and comfortable, in nad. Respiratory system: Clear to auscultation. Respiratory effort normal. Equal chest rise. Cardiovascular system: S1 & S2 heard, RRR. No JVD, murmurs, rubs, gallops or clicks. No pedal edema. Gastrointestinal system: Abdomen is nondistended, soft and nontender. No organomegaly or masses felt. Normal bowel sounds heard. Central nervous system: Alert and oriented. No focal neurological deficits. Extremities: Symmetric 5 x 5 power. Skin: No rashes, lesions or ulcers, on limited exam. Psychiatry: Judgement and insight appear normal. Mood & affect appropriate.   Data Reviewed: I have personally reviewed following labs and imaging  studies  CBC:  Recent Labs Lab 07/23/16 0418 07/24/16 0427 07/25/16 0300 07/27/16 0400  WBC 6.8 7.2 7.4 6.8  NEUTROABS 4.1  --   --   --   HGB 10.1* 10.7* 10.3* 10.4*  HCT 35.9* 39.2 38.0 37.8  MCV 93.2 96.6 96.4 94.3  PLT 191 197 200 [id number]   Basic Metabolic Panel:  Recent Labs Lab 07/24/16 0427 07/25/16 0300 07/26/16 0428 07/27/16 0400 07/28/16 0934  NA 152* 154* 153* 152* 147*  K 3.6 3.7 4.0 3.6 3.5  CL 100* 103 105 105 104  CO2 45* 45* 42* 42* 39*  GLUCOSE 192* 213* 191* 182* 160*  BUN 38* 37* 34* 37* 25*  CREATININE 1.27* 1.27* 1.23* 1.36* 1.12*  CALCIUM 9.3 9.5 9.0 9.0 8.7*    GFR: Estimated Creatinine Clearance: 56.9 mL/min (by C-G formula based on SCr of 1.12 mg/dL (H)). Liver Function Tests: No results for input(s): AST, ALT, ALKPHOS, BILITOT, PROT, ALBUMIN in the last 168 hours. No results for input(s): LIPASE, AMYLASE in the last 168 hours. No results for input(s): AMMONIA in the last 168 hours. Coagulation Profile: No results for input(s): INR, PROTIME in the last 168 hours. Cardiac Enzymes: No results for input(s): CKTOTAL, CKMB, CKMBINDEX, TROPONINI in the last 168 hours. BNP (last 3 results) No results for input(s): PROBNP in the last 8760 hours. HbA1C: No results for input(s): HGBA1C in the last 72 hours. CBG:  Recent Labs Lab 07/28/16 2125 07/29/16 0031 07/29/16 0353 07/29/16 0756 07/29/16 1159  GLUCAP 173* 120* 134* 97 167*   Lipid Profile: No results for input(s): CHOL, HDL, LDLCALC, TRIG, CHOLHDL, LDLDIRECT in the last 72 hours. Thyroid Function Tests: No results for input(s): TSH, T4TOTAL, FREET4, T3FREE, THYROIDAB in the last 72 hours. Anemia Panel: No results for input(s): VITAMINB12, FOLATE, FERRITIN, TIBC, IRON, RETICCTPCT in the last 72 hours. Sepsis Labs: No results for input(s): PROCALCITON, LATICACIDVEN in the last 168 hours.  Recent Results (from the past 240 hour(s))  Culture, blood (Routine X 2) w Reflex to ID Panel     Status: Abnormal   Collection Time: 07/19/16 11:18 PM  Result Value Ref Range Status   Specimen Description BLOOD RIGHT FOREARM  Final   Special Requests BOTTLES DRAWN AEROBIC AND ANAEROBIC 5ML  Final   Culture  Setup Time   Final    GRAM POSITIVE COCCI IN CLUSTERS ANAEROBIC BOTTLE ONLY CRITICAL RESULT CALLED TO, READ BACK BY AND VERIFIED WITH: B GREEN,PHARMD AT 1833 07/20/16 BY L BENFIELD    Culture (A)  Final    STAPHYLOCOCCUS SPECIES (COAGULASE NEGATIVE) SUSCEPTIBILITIES PERFORMED ON PREVIOUS CULTURE WITHIN THE LAST 5 DAYS.    Report Status 07/23/2016 FINAL  Final  Blood Culture ID Panel  (Reflexed)     Status: Abnormal   Collection Time: 07/19/16 11:18 PM  Result Value Ref Range Status   Enterococcus species NOT DETECTED NOT DETECTED Final   Listeria monocytogenes NOT DETECTED NOT DETECTED Final   Staphylococcus species DETECTED (A) NOT DETECTED Final    Comment: CRITICAL RESULT CALLED TO, READ BACK BY AND VERIFIED WITH: B GREEN,PHARMD AT 1833 07/20/16 BY L BENFIELD    Staphylococcus aureus NOT DETECTED NOT DETECTED Final   Methicillin resistance NOT DETECTED NOT DETECTED Final   Streptococcus species NOT DETECTED NOT DETECTED Final   Streptococcus agalactiae NOT DETECTED NOT DETECTED Final   Streptococcus pneumoniae NOT DETECTED NOT DETECTED Final   Streptococcus pyogenes NOT DETECTED NOT DETECTED Final   Acinetobacter baumannii NOT DETECTED NOT DETECTED Final  Enterobacteriaceae species NOT DETECTED NOT DETECTED Final   Enterobacter cloacae complex NOT DETECTED NOT DETECTED Final   Escherichia coli NOT DETECTED NOT DETECTED Final   Klebsiella oxytoca NOT DETECTED NOT DETECTED Final   Klebsiella pneumoniae NOT DETECTED NOT DETECTED Final   Proteus species NOT DETECTED NOT DETECTED Final   Serratia marcescens NOT DETECTED NOT DETECTED Final   Haemophilus influenzae NOT DETECTED NOT DETECTED Final   Neisseria meningitidis NOT DETECTED NOT DETECTED Final   Pseudomonas aeruginosa NOT DETECTED NOT DETECTED Final   Candida albicans NOT DETECTED NOT DETECTED Final   Candida glabrata NOT DETECTED NOT DETECTED Final   Candida krusei NOT DETECTED NOT DETECTED Final   Candida parapsilosis NOT DETECTED NOT DETECTED Final   Candida tropicalis NOT DETECTED NOT DETECTED Final  Culture, blood (Routine X 2) w Reflex to ID Panel     Status: Abnormal   Collection Time: 07/19/16 11:20 PM  Result Value Ref Range Status   Specimen Description BLOOD LEFT ARM  Final   Special Requests BOTTLES DRAWN AEROBIC AND ANAEROBIC 5ML  Final   Culture  Setup Time   Final    GRAM POSITIVE COCCI IN  CLUSTERS AEROBIC BOTTLE ONLY CRITICAL VALUE NOTED.  VALUE IS CONSISTENT WITH PREVIOUSLY REPORTED AND CALLED VALUE.    Culture STAPHYLOCOCCUS SPECIES (COAGULASE NEGATIVE) (A)  Final   Report Status 07/22/2016 FINAL  Final   Organism ID, Bacteria STAPHYLOCOCCUS SPECIES (COAGULASE NEGATIVE)  Final      Susceptibility   Staphylococcus species (coagulase negative) - MIC*    CIPROFLOXACIN <=0.5 SENSITIVE Sensitive     ERYTHROMYCIN >=8 RESISTANT Resistant     GENTAMICIN <=0.5 SENSITIVE Sensitive     OXACILLIN 0.5 RESISTANT Resistant     TETRACYCLINE >=16 RESISTANT Resistant     VANCOMYCIN 1 SENSITIVE Sensitive     TRIMETH/SULFA 80 RESISTANT Resistant     CLINDAMYCIN <=0.25 RESISTANT Resistant     RIFAMPIN <=0.5 SENSITIVE Sensitive     Inducible Clindamycin POSITIVE Resistant     * STAPHYLOCOCCUS SPECIES (COAGULASE NEGATIVE)  Urine culture     Status: Abnormal   Collection Time: 07/20/16  1:45 AM  Result Value Ref Range Status   Specimen Description URINE, RANDOM  Final   Special Requests NONE  Final   Culture MULTIPLE SPECIES PRESENT, SUGGEST RECOLLECTION (A)  Final   Report Status 07/21/2016 FINAL  Final  MRSA PCR Screening     Status: Abnormal   Collection Time: 07/20/16  7:15 AM  Result Value Ref Range Status   MRSA by PCR POSITIVE (A) NEGATIVE Final    Comment:        The GeneXpert MRSA Assay (FDA approved for NASAL specimens only), is one component of a comprehensive MRSA colonization surveillance program. It is not intended to diagnose MRSA infection nor to guide or monitor treatment for MRSA infections. RESULT CALLED TO, READ BACK BY AND VERIFIED WITH: ARNOLD,A RN AT 1006 ON 10.7.17 BY EPPERSON,S   Culture, Urine     Status: None   Collection Time: 07/22/16 10:21 AM  Result Value Ref Range Status   Specimen Description URINE, CATHETERIZED  Final   Special Requests NONE  Final   Culture NO GROWTH Performed at [facility]   Final   Report Status 07/23/2016  FINAL  Final  Culture, blood (Routine X 2) w Reflex to ID Panel     Status: None   Collection Time: 07/23/16 10:17 AM  Result Value Ref Range Status   Specimen Description  BLOOD LEFT ARM  Final   Special Requests IN PEDIATRIC BOTTLE 3CC  Final   Culture   Final    NO GROWTH 5 DAYS Performed at [facility]    Report Status 07/28/2016 FINAL  Final  Culture, blood (Routine X 2) w Reflex to ID Panel     Status: None   Collection Time: 07/23/16 10:19 AM  Result Value Ref Range Status   Specimen Description BLOOD LEFT HAND  Final   Special Requests IN PEDIATRIC BOTTLE 4CC  Final   Culture   Final    NO GROWTH 5 DAYS Performed at [facility]    Report Status 07/28/2016 FINAL  Final     Radiology Studies: No results found.   Scheduled Meds: . aspirin  81 mg Oral Daily  . DULoxetine  40 mg Oral Daily  . heparin  5,000 Units Subcutaneous Q8H  . insulin aspart  0-15 Units Subcutaneous Q4H  . sodium chloride flush  10-40 mL Intracatheter Q12H   Continuous Infusions:     LOS: 9 days   Time spent: > 35 minutes  Velvet Bathe, MD [facility] Pager [phone]  If 7PM-7AM, please contact night-coverage [email or web address] Password [email or web address] 07/29/2016, 3:15 PM

## 2016-07-29 NOTE — Progress Notes (Signed)
Nutrition Follow-up  DOCUMENTATION CODES:   Morbid obesity  INTERVENTION:  Encouraged adequate intake of protein and calories through food and beverages.  Ordered Premier Protein po BID, each supplement contains 160 kcal and 30 grams protein.  RD will continue to follow and monitor intake.  NUTRITION DIAGNOSIS:   Inadequate oral intake related to poor appetite, nausea as evidenced by per patient/family report.  Ongoing.  GOAL:   Patient will meet greater than or equal to 90% of their needs  Not met.  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 She was placed on bipap but continued to have increased WOB and decreased alertness so was intubated in the ED. Pulmonary edema on CXR.  -Patient advanced to Dysphagia 2 diet with thin liquids on 10/13 per SLP recommendations.  Patient seemed to be slightly confused upon assessment. She reports that her appetite is not back to normal yet. Endorses nausea. Denies abdominal pain or difficulty chewing/swallowing. Patient is unsure of her UBW.  Patient amenable to drinking Premier Protein.   Meal Completion: 50-65% per chart. In the past 24 hours, patient has had 1257 kcal (82% minimum estimated needs) and 57.5 grams protein (52% minimum estimated needs).   Weight trend: CBW of 137.9 kg is -9.1 kg from admission. Updated estimated needs.  Medications reviewed and include: Novolog sliding scale Q4hrs.  Labs reviewed: CBG 97-181 past 24 hrs. Chem Profile on 10/15: Sodium 147, CO2 39, Bun 25, Creatinine 1.12.  Discussed plan with Tech. Patient is under full supervision at meals.  Diet  Order:  DIET DYS 2 Room service appropriate? Yes; Fluid consistency: Thin  Skin:  Wound (see comment) (Open wounds left leg, Right BKA)  Last BM:  10/10  Height:   Ht Readings from Last 1 Encounters:  07/29/16 4' 9" (1.448 m)    Weight:   Wt Readings from Last 1 Encounters:  07/29/16 (!) 304 lb (137.9 kg)    Ideal Body Weight:  46.75 kg  BMI:  Body mass index is 65.78 kg/m.  Estimated Nutritional Needs:   Kcal:  1515-1800 (11-13 kcal/kg actual body weight)  Protein:  100-115 grams  Fluid:  1.5-1.8 L/day  EDUCATION NEEDS:   No education needs identified at this time  Willey Blade, MS, RD, LDN Pager: [phone] After Hours Pager: [phone]

## 2016-07-30 ENCOUNTER — Inpatient Hospital Stay (HOSPITAL_COMMUNITY): Payer: Medicare Other

## 2016-07-30 DIAGNOSIS — J9610 Chronic respiratory failure, unspecified whether with hypoxia or hypercapnia: Secondary | ICD-10-CM

## 2016-07-30 DIAGNOSIS — Z89611 Acquired absence of right leg above knee: Secondary | ICD-10-CM

## 2016-07-30 DIAGNOSIS — R197 Diarrhea, unspecified: Secondary | ICD-10-CM

## 2016-07-30 DIAGNOSIS — R509 Fever, unspecified: Secondary | ICD-10-CM

## 2016-07-30 LAB — BASIC METABOLIC PANEL
ANION GAP: 2 — AB (ref 5–15)
BUN: 16 mg/dL (ref 6–20)
CO2: 38 mmol/L — AB (ref 22–32)
Calcium: 8.6 mg/dL — ABNORMAL LOW (ref 8.9–10.3)
Chloride: 105 mmol/L (ref 101–111)
Creatinine, Ser: 0.97 mg/dL (ref 0.44–1.00)
GFR calc Af Amer: >60 mL/min (ref >60)
GFR calc non Af Amer: 57 mL/min — ABNORMAL LOW (ref >60)
GLUCOSE: 125 mg/dL — AB (ref 65–99)
POTASSIUM: 3.9 mmol/L (ref 3.5–5.1)
Sodium: 145 mmol/L (ref 135–145)

## 2016-07-30 LAB — GLUCOSE, CAPILLARY
GLUCOSE-CAPILLARY: 131 mg/dL — AB (ref 65–99)
Glucose-Capillary: 102 mg/dL — ABNORMAL HIGH (ref 65–99)
Glucose-Capillary: 112 mg/dL — ABNORMAL HIGH (ref 65–99)
Glucose-Capillary: 115 mg/dL — ABNORMAL HIGH (ref 65–99)
Glucose-Capillary: 117 mg/dL — ABNORMAL HIGH (ref 65–99)
Glucose-Capillary: 125 mg/dL — ABNORMAL HIGH (ref 65–99)

## 2016-07-30 LAB — URINE MICROSCOPIC-ADD ON

## 2016-07-30 LAB — URINALYSIS, ROUTINE W REFLEX MICROSCOPIC
Bilirubin Urine: NEGATIVE
GLUCOSE, UA: NEGATIVE mg/dL
Hgb urine dipstick: NEGATIVE
Ketones, ur: NEGATIVE mg/dL
Nitrite: NEGATIVE
PH: 6 (ref 5.0–8.0)
PROTEIN: 30 mg/dL — AB
SPECIFIC GRAVITY, URINE: 1.019 (ref 1.005–1.030)

## 2016-07-30 MED ORDER — ACETAMINOPHEN 325 MG PO TABS
650.0000 mg | ORAL_TABLET | ORAL | Status: DC | PRN
  Administered 2016-07-30: 650 mg via ORAL
  Filled 2016-07-30: qty 2

## 2016-07-30 MED ORDER — FUROSEMIDE 40 MG PO TABS
40.0000 mg | ORAL_TABLET | Freq: Every day | ORAL | Status: DC
  Administered 2016-07-30 – 2016-08-01 (×3): 40 mg via ORAL
  Filled 2016-07-30 (×3): qty 1

## 2016-07-30 NOTE — Consult Note (Signed)
   [facility] CM Inpatient Consult   07/30/2016  [name]. [place] September 01, 1946 [id number]     Patient screened for potential [facility] Care Management services. Chart reviewed. Noted current discharge plan is for  SNF. Confirmed with inpatient RNCM. There are no identifiable [facility] Care Management needs at this time. If patient's post hospital needs change, please place a [facility] Care Management consult. For questions please contact:  Marthenia Rolling, [place], RN,BSN [facility] Liaison [phone]

## 2016-07-30 NOTE — NC FL2 (Signed)
[place] LEVEL OF CARE SCREENING TOOL     IDENTIFICATION  Patient Name: Heather Zimmerman. Heringer Birthdate: 05/24/1946 Sex: female Admission Date (Current Location): 07/19/2016  [place] and [place] Number:  Herbalist and Address:  [facility],  [place] [address], [place]      Provider Number: [id number]  Attending Physician Name and Address:  Velvet Bathe, MD  Relative Name and Phone Number:       Current Level of Care: Hospital Recommended Level of Care: [place] Prior Approval Number:    Date Approved/Denied:   PASRR Number:    Discharge Plan: SNF    Current Diagnoses: Patient Active Problem List   Diagnosis Date Noted  . Coag negative Staphylococcus bacteremia   . Chronic venous stasis dermatitis   . Respiratory failure ([place]) 07/20/2016  . Acute respiratory failure with hypercapnia ([place]) 07/20/2016  . Weakness of right upper extremity 07/09/2016  . Type II diabetes mellitus with neurological manifestations, uncontrolled ([place]) 04/23/2016  . Ventral hernia without obstruction or gangrene 02/24/2016  . Chronic respiratory failure with hypercapnia ([place]) 02/24/2016  . Essential hypertension, benign 02/24/2016  . Occult blood in stools 12/26/2015  . Hypoxemia   . Candidiasis of perineum 10/26/2015  . 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 CKD (chronic kidney disease) stage 3, GFR 30-59 ml/min 07/21/2015  . Cellulitis of leg, left 07/19/2015  . Open wound of left lower extremity 07/18/2015  . Urinary retention 07/18/2015  . Venous ulcer of left leg ([place]) 12/27/2014  . Hx of right BKA ([place]) 11/22/2014  . Peripheral autonomic neuropathy due to diabetes mellitus ([place]) 10/09/2014  . Obstructive sleep apnea 07/05/2014  . Acute on chronic respiratory failure with hypercapnia ([place]) 06/21/2014  . Morbid obesity ([place]) 08/30/2013  . Unspecified vitamin D  deficiency 12/31/2012  . Disorder of magnesium metabolism 12/31/2012  . Depression 12/31/2012  . Chronic pain 12/31/2012  . Chronic diastolic heart failure ([place]) 12/31/2012  . Allergic rhinitis due to pollen 12/31/2012  . GERD 12/31/2012    Orientation RESPIRATION BLADDER Height & Weight     Self, Time, Situation, Place  Normal Indwelling catheter (Chronic Catherer ) Weight: 274 lb (124.3 kg) Height:  4\' 9"  (144.8 cm)  BEHAVIORAL SYMPTOMS/MOOD NEUROLOGICAL BOWEL NUTRITION STATUS      Continent Diet  AMBULATORY STATUS COMMUNICATION OF NEEDS Skin   Extensive Assist Verbally Other (Comment) (Wound: Left lower leg/ Dressing are daily )                       Personal Care Assistance Level of Assistance  Bathing, Feeding, Dressing Bathing Assistance: Maximum assistance Feeding assistance: Independent Dressing Assistance: Maximum assistance     Functional Limitations Info  Sight, Hearing, Speech Sight Info: Adequate Hearing Info: Adequate Speech Info: Adequate    SPECIAL CARE FACTORS FREQUENCY  PT (By licensed PT), OT (By licensed OT), Speech therapy     PT Frequency: 5       Speech Therapy Frequency: 5      Contractures Contractures Info: Not present    Additional Factors Info  Code Status, Allergies, Isolation Precautions Code Status Info: Full Code Allergies Info: Ace Inhibitors     Isolation Precautions Info: MRSA      Current Medications (07/30/2016):  This is the current hospital active medication list Current Facility-Administered Medications  Medication Dose Route Frequency Provider Last Rate Last Dose  .  acetaminophen (TYLENOL) suppository 650 mg  650 mg Rectal Q4H PRN Chesley Mires, MD      . aspirin chewable tablet 81 mg  81 mg Oral Daily Chesley Mires, MD   81 mg at 07/29/16 1035  . DULoxetine (CYMBALTA) DR capsule 40 mg  40 mg Oral Daily Velvet Bathe, MD   40 mg at 07/29/16 1035  . heparin injection 5,000 Units  5,000 Units Subcutaneous Q8H Luz Brazen, MD   5,000 Units at 07/30/16 0516  . insulin aspart (novoLOG) injection 0-15 Units  0-15 Units Subcutaneous Q4H Collene Gobble, MD   2 Units at 07/29/16 2359  . lip balm (CARMEX) ointment   Topical PRN [name], NP      . phenol (CHLORASEPTIC) mouth spray 1 spray  1 spray Mouth/Throat PRN Chesley Mires, MD   1 spray at 07/26/16 0951  . protein supplement (PREMIER PROTEIN) liquid  11 oz Oral BID BM Velvet Bathe, MD   11 oz at 07/29/16 1623  . sodium chloride flush (NS) 0.9 % injection 10-40 mL  10-40 mL Intracatheter Q12H Javier Glazier, MD   10 mL at 07/29/16 2119  . sodium chloride flush (NS) 0.9 % injection 10-40 mL  10-40 mL Intracatheter PRN Javier Glazier, MD   30 mL at 07/30/16 0019     Discharge Medications: Please see discharge summary for a list of discharge medications.  Relevant Imaging Results:  Relevant Lab Results:   Additional Information SS#: [id number]  Lia Hopping, LCSW

## 2016-07-30 NOTE — Progress Notes (Signed)
    [facility] for Infectious Disease   Reason for visit: Follow up on fever  Interval History: previously seen by my partner Dr. Baxter Flattery about 3 days ago for cellulitis and CoNS bacteremia in 2/2 cultures.  Repeat blood cultures have remained negative.  Developed a fever today to 101.6.  Did not feel fever or any chills. Otherwise has not had any fever since 10/10 of 100.4.  No new symptoms including no sob, no urinary discomfort, no cough.  Loose stool x 2 today.  CXR independently reviewed and no new acute findings  Physical Exam: Constitutional:  Vitals:   07/30/16 0405 07/30/16 0457  BP: (!) 125/59 (!) 121/55  Pulse: 73 98  Resp: 18 18  Temp: 98.9 F (37.2 C) (!) 101.6 F (38.7 C)   patient appears in NAD Eyes: anicteric HENT: no thrush Respiratory: Normal respiratory effort; CTA B Cardiovascular: RRR GI: soft, nt, nd MS: right leg AKA; left leg with chronic vascular changes, no erythema, no warmth  Review of Systems: Constitutional: negative for chills and anorexia Genitourinary: negative for frequency, dysuria and hesitancy Musculoskeletal: negative for myalgias  Lab Results  Component Value Date   WBC 6.8 07/27/2016   HGB 10.4 (L) 07/27/2016   HCT 37.8 07/27/2016   MCV 94.3 07/27/2016   PLT 210 07/27/2016    Lab Results  Component Value Date   CREATININE 0.97 07/30/2016   BUN 16 07/30/2016   NA 145 07/30/2016   K 3.9 07/30/2016   CL 105 07/30/2016   CO2 38 (H) 07/30/2016    Lab Results  Component Value Date   ALT 16 07/19/2016   AST 19 07/19/2016   ALKPHOS 71 07/19/2016     Microbiology: Recent Results (from the past 240 hour(s))  Culture, Urine     Status: None   Collection Time: 07/22/16 10:21 AM  Result Value Ref Range Status   Specimen Description URINE, CATHETERIZED  Final   Special Requests NONE  Final   Culture NO GROWTH Performed at [facility]   Final   Report Status 07/23/2016 FINAL  Final  Culture, blood (Routine X 2) w  Reflex to ID Panel     Status: None   Collection Time: 07/23/16 10:17 AM  Result Value Ref Range Status   Specimen Description BLOOD LEFT ARM  Final   Special Requests IN PEDIATRIC BOTTLE 3CC  Final   Culture   Final    NO GROWTH 5 DAYS Performed at [facility]    Report Status 07/28/2016 FINAL  Final  Culture, blood (Routine X 2) w Reflex to ID Panel     Status: None   Collection Time: 07/23/16 10:19 AM  Result Value Ref Range Status   Specimen Description BLOOD LEFT HAND  Final   Special Requests IN PEDIATRIC BOTTLE 4CC  Final   Culture   Final    NO GROWTH 5 DAYS Performed at [facility]    Report Status 07/28/2016 FINAL  Final    Impression/Plan:  1. Fever - no etiology identified with no symptoms.  2 loose stools but this is not c/w C diff.  No other symptoms to suggest active infectious source. Continue to observe off of antibiotics 2. Bacteremia - resolved.  Repeat blood cultures sent again due to fever.  Will monitor.   3. Chronic respiratory failure - stable now, on chronic O2

## 2016-07-30 NOTE — Progress Notes (Signed)
PROGRESS NOTE    Heather Zimmerman. [name]  [id number] DOB: 10-Jul-1946 DOA: 07/19/2016 PCP: Gildardo Cranker, DO    Brief Narrative:  70 yo female presented with dyspnea and altered mental status with VDRF from acute on chronic hypoxic/hypercapnic respiratory failure in setting of OSA/OHS, acute on chronic diastolic CHF.  Found to have sepsis with coag negative Staph bacteremia, Lt lower leg cellulitis.  Assessment & Plan:  Acute on chronic hypercarbic respiratory failure. OSA/OHS. P:   Needs Bipap at night and prn during the day Continue Oxygen to keep SpO2 90 to 95%   Acute pulmonary edema >> improved. Acute on chronic diastolic CHF. Hx of PVD. P:  Continue oral lasix today Monitor hemodynamics ASA continued  Hypernatremia. P:   Saline lock and monitor Continue holding lasix F/u BMET  Morbid Obesity. Dysphagia. P:   F/u with speech therapy >> dysphagia 2 diet. Nutrition assessment once more stable  Anemia of critical illness. P:  F/u CBC intermittently, on last check 10.4 (10/14) SQ heparin for DVT prophylaxis  Sepsis from Lt lower leg cellulitis with coag negative Staph bacteremia (present on admission) P:   Antibiotics per ID. F/u repeat blood culture from 10/10 Wound care Pt spiked fever as such recultured and reconsulted ID 07/30/16  DM type II. P:   SSI  Acute metabolic encephalopathy >> improved. Hx of depression, chronic pain, DM neuropathy. P:   Hold outpt neurontin, continued cymbalta.  Deconditioning. P: PT/OT on board  DVT prophylaxis: Heparin Code Status: Full Family Communication:  Disposition Plan: transfer to telemetry   Consultants:   Consult ID   Procedures: s/p intubation   Antimicrobials: Vancomycin   Subjective: Pt does not report any new complaints.  Objective: Vitals:   07/29/16 2048 07/30/16 0405 07/30/16 0409 07/30/16 0457  BP: 122/64 (!) 125/59  (!) 121/55  Pulse: 68 73  98  Resp: 18 18  18   Temp: 97.5  F (36.4 C) 98.9 F (37.2 C)  (!) 101.6 F (38.7 C)  TempSrc: Oral Oral  Oral  SpO2: 100% 100%  96%  Weight:   124.3 kg (274 lb)   Height:        Intake/Output Summary (Last 24 hours) at 07/30/16 1417 Last data filed at 07/30/16 1037  Gross per 24 hour  Intake              240 ml  Output             1800 ml  Net            -1560 ml   Filed Weights   07/28/16 1413 07/29/16 1512 07/30/16 0409  Weight: (!) 150.1 kg (331 lb) (!) 137.9 kg (304 lb) 124.3 kg (274 lb)    Examination:  General exam: Appears calm and comfortable, in nad. Respiratory system: Clear to auscultation. Respiratory effort normal. Equal chest rise. Cardiovascular system: S1 & S2 heard, RRR. No JVD, murmurs, rubs, gallops or clicks. No pedal edema. Gastrointestinal system: Abdomen is nondistended, soft and nontender. No organomegaly or masses felt. Normal bowel sounds heard. Central nervous system: Alert and oriented. No focal neurological deficits. Extremities: Symmetric 5 x 5 power. Skin: No rashes, lesions or ulcers, on limited exam. Psychiatry: Judgement and insight appear normal. Mood & affect appropriate.   Data Reviewed: I have personally reviewed following labs and imaging studies  CBC:  Recent Labs Lab 07/24/16 0427 07/25/16 0300 07/27/16 0400  WBC 7.2 7.4 6.8  HGB 10.7* 10.3* 10.4*  HCT 39.2 38.0 37.8  MCV 96.6 96.4 94.3  PLT 197 200 [id number]   Basic Metabolic Panel:  Recent Labs Lab 07/25/16 0300 07/26/16 0428 07/27/16 0400 07/28/16 0934 07/30/16 0010  NA 154* 153* 152* 147* 145  K 3.7 4.0 3.6 3.5 3.9  CL 103 105 105 104 105  CO2 45* 42* 42* 39* 38*  GLUCOSE 213* 191* 182* 160* 125*  BUN 37* 34* 37* 25* 16  CREATININE 1.27* 1.23* 1.36* 1.12* 0.97  CALCIUM 9.5 9.0 9.0 8.7* 8.6*   GFR: Estimated Creatinine Clearance: 61.2 mL/min (by C-G formula based on SCr of 0.97 mg/dL). Liver Function Tests: No results for input(s): AST, ALT, ALKPHOS, BILITOT, PROT, ALBUMIN in the last 168  hours. No results for input(s): LIPASE, AMYLASE in the last 168 hours. No results for input(s): AMMONIA in the last 168 hours. Coagulation Profile: No results for input(s): INR, PROTIME in the last 168 hours. Cardiac Enzymes: No results for input(s): CKTOTAL, CKMB, CKMBINDEX, TROPONINI in the last 168 hours. BNP (last 3 results) No results for input(s): PROBNP in the last 8760 hours. HbA1C: No results for input(s): HGBA1C in the last 72 hours. CBG:  Recent Labs Lab 07/29/16 2044 07/29/16 2348 07/30/16 0401 07/30/16 0752 07/30/16 1204  GLUCAP 136* 128* 117* 102* 125*   Lipid Profile: No results for input(s): CHOL, HDL, LDLCALC, TRIG, CHOLHDL, LDLDIRECT in the last 72 hours. Thyroid Function Tests: No results for input(s): TSH, T4TOTAL, FREET4, T3FREE, THYROIDAB in the last 72 hours. Anemia Panel: No results for input(s): VITAMINB12, FOLATE, FERRITIN, TIBC, IRON, RETICCTPCT in the last 72 hours. Sepsis Labs: No results for input(s): PROCALCITON, LATICACIDVEN in the last 168 hours.  Recent Results (from the past 240 hour(s))  Culture, Urine     Status: None   Collection Time: 07/22/16 10:21 AM  Result Value Ref Range Status   Specimen Description URINE, CATHETERIZED  Final   Special Requests NONE  Final   Culture NO GROWTH Performed at [facility]   Final   Report Status 07/23/2016 FINAL  Final  Culture, blood (Routine X 2) w Reflex to ID Panel     Status: None   Collection Time: 07/23/16 10:17 AM  Result Value Ref Range Status   Specimen Description BLOOD LEFT ARM  Final   Special Requests IN PEDIATRIC BOTTLE 3CC  Final   Culture   Final    NO GROWTH 5 DAYS Performed at [facility]    Report Status 07/28/2016 FINAL  Final  Culture, blood (Routine X 2) w Reflex to ID Panel     Status: None   Collection Time: 07/23/16 10:19 AM  Result Value Ref Range Status   Specimen Description BLOOD LEFT HAND  Final   Special Requests IN PEDIATRIC BOTTLE 4CC   Final   Culture   Final    NO GROWTH 5 DAYS Performed at [facility]    Report Status 07/28/2016 FINAL  Final     Radiology Studies: Dg Chest Port 1 View  Result Date: 07/30/2016 CLINICAL DATA:  Fevers and weakness EXAM: PORTABLE CHEST 1 VIEW COMPARISON:  07/25/2016 FINDINGS: Endotracheal tube and nasogastric catheter been removed in the interval. Right-sided PICC line remains in place. Cardiac shadow remains enlarged. The overall inspiratory effort is poor. Persistent congestive failure is again noted. IMPRESSION: Stable vascular congestion. No new focal abnormality is seen. Electronically Signed   By: Inez Catalina M.D.   On: 07/30/2016 13:28     Scheduled Meds: . aspirin  81 mg Oral Daily  .  DULoxetine  40 mg Oral Daily  . heparin  5,000 Units Subcutaneous Q8H  . insulin aspart  0-15 Units Subcutaneous Q4H  . protein supplement shake  11 oz Oral BID BM  . sodium chloride flush  10-40 mL Intracatheter Q12H   Continuous Infusions:     LOS: 10 days   Time spent: > 35 minutes  Velvet Bathe, MD [facility] Pager [phone]  If 7PM-7AM, please contact night-coverage [email or web address] Password [email or web address] 07/30/2016, 2:17 PM

## 2016-07-30 NOTE — Care Management Important Message (Signed)
Important Message  Patient Details  Name: Heather Zimmerman MRN: [id number] Date of Birth: 1946-06-19   Medicare Important Message Given:  Yes    Camillo Flaming 07/30/2016, 11:19 AMImportant Message  Patient Details  Name: Heather Zimmerman MRN: [id number] Date of Birth: [birth year]   Medicare Important Message Given:  Yes    Camillo Flaming 07/30/2016, 11:19 AM

## 2016-07-30 NOTE — NC FL2 (Deleted)
[place] LEVEL OF CARE SCREENING TOOL     IDENTIFICATION  Patient Name: Heather Zimmerman. Galey Birthdate: September 06, 1946 Sex: female Admission Date (Current Location): 07/19/2016  [place] and [place] Number:  Herbalist and Address:  [facility],  [place] [address], [place]      Provider Number: [id number]  Attending Physician Name and Address:  Velvet Bathe, MD  Relative Name and Phone Number:       Current Level of Care: Hospital Recommended Level of Care: [place] Prior Approval Number:    Date Approved/Denied:   PASRR Number:    Discharge Plan: SNF    Current Diagnoses: Patient Active Problem List   Diagnosis Date Noted  . Coag negative Staphylococcus bacteremia   . Chronic venous stasis dermatitis   . Respiratory failure ([place]) 07/20/2016  . Acute respiratory failure with hypercapnia ([place]) 07/20/2016  . Weakness of right upper extremity 07/09/2016  . Type II diabetes mellitus with neurological manifestations, uncontrolled ([place]) 04/23/2016  . Ventral hernia without obstruction or gangrene 02/24/2016  . Chronic respiratory failure with hypercapnia ([place]) 02/24/2016  . Essential hypertension, benign 02/24/2016  . Occult blood in stools 12/26/2015  . Hypoxemia   . Candidiasis of perineum 10/26/2015  . 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 CKD (chronic kidney disease) stage 3, GFR 30-59 ml/min 07/21/2015  . Cellulitis of leg, left 07/19/2015  . Open wound of left lower extremity 07/18/2015  . Urinary retention 07/18/2015  . Venous ulcer of left leg ([place]) 12/27/2014  . Hx of right BKA ([place]) 11/22/2014  . Peripheral autonomic neuropathy due to diabetes mellitus ([place]) 10/09/2014  . Obstructive sleep apnea 07/05/2014  . Acute on chronic respiratory failure with hypercapnia ([place]) 06/21/2014  . Morbid obesity ([place]) 08/30/2013  . Unspecified vitamin D  deficiency 12/31/2012  . Disorder of magnesium metabolism 12/31/2012  . Depression 12/31/2012  . Chronic pain 12/31/2012  . Chronic diastolic heart failure ([place]) 12/31/2012  . Allergic rhinitis due to pollen 12/31/2012  . GERD 12/31/2012    Orientation RESPIRATION BLADDER Height & Weight     Self, Time, Situation, Place  Normal Continent Weight: 274 lb (124.3 kg) Height:  4\' 9"  (144.8 cm)  BEHAVIORAL SYMPTOMS/MOOD NEUROLOGICAL BOWEL NUTRITION STATUS      Continent Diet  AMBULATORY STATUS COMMUNICATION OF NEEDS Skin   Extensive Assist Verbally                         Personal Care Assistance Level of Assistance  Bathing, Feeding, Dressing Bathing Assistance: Maximum assistance Feeding assistance: Independent Dressing Assistance: Maximum assistance     Functional Limitations Info  Sight, Hearing, Speech Sight Info: Adequate Hearing Info: Adequate Speech Info: Adequate    SPECIAL CARE FACTORS FREQUENCY  PT (By licensed PT), OT (By licensed OT), Speech therapy     PT Frequency: 5       Speech Therapy Frequency: 5      Contractures Contractures Info: Not present    Additional Factors Info  Code Status, Allergies, Isolation Precautions Code Status Info: Full Code Allergies Info: Ace Inhibitors     Isolation Precautions Info: MRSA      Current Medications (07/30/2016):  This is the current hospital active medication list Current Facility-Administered Medications  Medication Dose Route Frequency Provider Last Rate Last Dose  . acetaminophen (TYLENOL) suppository 650 mg  650 mg Rectal Q4H PRN Vineet  Sood, MD      . aspirin chewable tablet 81 mg  81 mg Oral Daily Chesley Mires, MD   81 mg at 07/29/16 1035  . DULoxetine (CYMBALTA) DR capsule 40 mg  40 mg Oral Daily Velvet Bathe, MD   40 mg at 07/29/16 1035  . heparin injection 5,000 Units  5,000 Units Subcutaneous Q8H Luz Brazen, MD   5,000 Units at 07/30/16 0516  . insulin aspart (novoLOG) injection 0-15  Units  0-15 Units Subcutaneous Q4H Collene Gobble, MD   2 Units at 07/29/16 2359  . lip balm (CARMEX) ointment   Topical PRN [name], NP      . phenol (CHLORASEPTIC) mouth spray 1 spray  1 spray Mouth/Throat PRN Chesley Mires, MD   1 spray at 07/26/16 0951  . protein supplement (PREMIER PROTEIN) liquid  11 oz Oral BID BM Velvet Bathe, MD   11 oz at 07/29/16 1623  . sodium chloride flush (NS) 0.9 % injection 10-40 mL  10-40 mL Intracatheter Q12H Javier Glazier, MD   10 mL at 07/29/16 2119  . sodium chloride flush (NS) 0.9 % injection 10-40 mL  10-40 mL Intracatheter PRN Javier Glazier, MD   30 mL at 07/30/16 0019     Discharge Medications: Please see discharge summary for a list of discharge medications.  Relevant Imaging Results:  Relevant Lab Results:   Additional Information SS#: [id number]  Lia Hopping, LCSW

## 2016-07-31 DIAGNOSIS — A419 Sepsis, unspecified organism: Secondary | ICD-10-CM

## 2016-07-31 DIAGNOSIS — R7881 Bacteremia: Secondary | ICD-10-CM

## 2016-07-31 DIAGNOSIS — R1312 Dysphagia, oropharyngeal phase: Secondary | ICD-10-CM

## 2016-07-31 DIAGNOSIS — I5033 Acute on chronic diastolic (congestive) heart failure: Secondary | ICD-10-CM

## 2016-07-31 LAB — BASIC METABOLIC PANEL
Anion gap: 6 (ref 5–15)
BUN: 14 mg/dL (ref 6–20)
CHLORIDE: 100 mmol/L — AB (ref 101–111)
CO2: 38 mmol/L — ABNORMAL HIGH (ref 22–32)
CREATININE: 1.02 mg/dL — AB (ref 0.44–1.00)
Calcium: 8.8 mg/dL — ABNORMAL LOW (ref 8.9–10.3)
GFR, EST NON AFRICAN AMERICAN: 54 mL/min — AB (ref >60)
Glucose, Bld: 83 mg/dL (ref 65–99)
Potassium: 3.8 mmol/L (ref 3.5–5.1)
SODIUM: 144 mmol/L (ref 135–145)

## 2016-07-31 LAB — GLUCOSE, CAPILLARY
GLUCOSE-CAPILLARY: 86 mg/dL (ref 65–99)
Glucose-Capillary: 103 mg/dL — ABNORMAL HIGH (ref 65–99)
Glucose-Capillary: 91 mg/dL (ref 65–99)
Glucose-Capillary: 133 mg/dL — ABNORMAL HIGH (ref 65–99)
Glucose-Capillary: 181 mg/dL — ABNORMAL HIGH (ref 65–99)

## 2016-07-31 MED ORDER — ASPIRIN 81 MG PO TABS: 81.0000 mg | ORAL_TABLET | Freq: Every day | ORAL | Status: DC

## 2016-07-31 MED ORDER — MAGNESIUM OXIDE 400 (241.3 MG) MG PO TABS
400.0000 mg | ORAL_TABLET | Freq: Three times a day (TID) | ORAL | Status: DC
  Administered 2016-07-31 – 2016-08-01 (×5): 400 mg via ORAL
  Filled 2016-07-31 (×5): qty 1

## 2016-07-31 MED ORDER — LORATADINE 10 MG PO TABS
10.0000 mg | ORAL_TABLET | Freq: Every day | ORAL | Status: DC
  Administered 2016-07-31 – 2016-08-01 (×2): 10 mg via ORAL
  Filled 2016-07-31 (×2): qty 1

## 2016-07-31 MED ORDER — FLUTICASONE PROPIONATE 50 MCG/ACT NA SUSP
2.0000 | Freq: Every day | NASAL | Status: DC
  Administered 2016-07-31: 2 via NASAL
  Filled 2016-07-31: qty 16

## 2016-07-31 MED ORDER — MOMETASONE FURO-FORMOTEROL FUM 200-5 MCG/ACT IN AERO
2.0000 | INHALATION_SPRAY | Freq: Two times a day (BID) | RESPIRATORY_TRACT | Status: DC
  Administered 2016-07-31 – 2016-08-01 (×2): 2 via RESPIRATORY_TRACT
  Filled 2016-07-31: qty 8.8

## 2016-07-31 NOTE — Progress Notes (Signed)
PROGRESS NOTE    Montez Koetje. [name]  [id number] DOB: May 08, 1946 DOA: 07/19/2016 PCP: Gildardo Cranker, DO   Brief Narrative:  70 yo female presented with dyspnea and altered mental status with VDRF from acute on chronic hypoxic/hypercapnic respiratory failure in setting of OSA/OHS, acute on chronic diastolic CHF. Found to have sepsis with coag negative Staph bacteremia, Lt lower leg cellulitis.    Assessment & Plan:   Active Problems:   Respiratory failure (HCC)   Acute respiratory failure with hypercapnia (HCC)   Coag negative Staphylococcus bacteremia   Chronic venous stasis dermatitis  #1 acute on chronic hypercarbic respiratory failure secondary to OSA/OHS and acute on chronic diastolic CHF exacerbation Patient noted to be in acute on chronic hypercarbic respiratory failure on admission and was intubated secondary to altered mental status and shortness of breath. Patient improved with diuresis and currently on oral Lasix. Clinical improvement. Was recommended per pulmonary the patient needs BiPAP at night and when necessary during the day. Per nursing patient has been refusing IPAP. Continue current regimen. Follow.  #2 acute on chronic diastolic heart failure/history of peripheral vascular disease Patient with clinical improvement. Patient with good diuresis and currently on oral Lasix. Follow.  #3 hypernatremia Improved.  #4 sepsis from left lower extremity cellulitis and coagulase negative staph bacteremia Patient has been treated adequately with IV vancomycin, IV Fortaz, IV cefepime. Repeat blood cultures with no growth to date. Patient has been assessed by infectious diseases and feel patient has been adequately treated with antibiotics and require no further treatment. Follow blood cultures.  #5 diabetes mellitus type 2 Sliding scale insulin.  #6 acute metabolic encephalopathy Improved.  #7 deconditioning DT/OT.    DVT prophylaxis: Heparin Code Status:  Full Family Communication: Updated patient. No family at bedside. Disposition Plan: Skilled nursing facility in the next 24-48 hours.   Consultants:   Wound care  Infectious diseases Dr. Baxter Flattery 07/28/2016  Procedures:   Chest x-ray 07/19/2016, 07/20/2016, 07/21/2016, 07/22/2016, 07/23/2016, 07/24/2016, 07/25/2016, 07/30/2016  2-D echo 07/22/2016  Antimicrobials:   IV Fortaz 07/20/2016>>>> 07/21/2016  IV Rocephin 07/20/2016>>>>> 07/23/2016  IV vancomycin 07/22/2016>>>>> 07/29/2016   Subjective: Patient denies any chest pain. No shortness of breath. Patient currently afebrile.  Objective: Vitals:   07/30/16 1700 07/30/16 2137 07/31/16 0441 07/31/16 0500  BP: (!) 118/58 (!) 120/50 (!) 128/55   Pulse: 76 68 62   Resp: 16 18 18    Temp: 98 F (36.7 C) 98.1 F (36.7 C) 98.8 F (37.1 C)   TempSrc: Oral Oral Oral   SpO2: 100% 98% 99%   Weight:    118.8 kg (262 lb)  Height:        Intake/Output Summary (Last 24 hours) at 07/31/16 1206 Last data filed at 07/31/16 0409  Gross per 24 hour  Intake              340 ml  Output             1575 ml  Net            -1235 ml   Filed Weights   07/29/16 1512 07/30/16 0409 07/31/16 0500  Weight: (!) 137.9 kg (304 lb) 124.3 kg (274 lb) 118.8 kg (262 lb)    Examination:  General exam: Appears calm and comfortable  Respiratory system: Clear to auscultation anterior lung fields.Marland Kitchen Respiratory effort normal. Cardiovascular system: Distant HS secondary to body habitus. S1 & S2 heard, RRR. No JVD, murmurs, rubs, gallops or clicks. No pedal edema. Gastrointestinal system: Abdomen  is nondistended, soft and nontender. No organomegaly or masses felt. Normal bowel sounds heard. Central nervous system: Alert and oriented. No focal neurological deficits. Extremities: Symmetric 5 x 5 power. Skin: No rashes, lesions or ulcers Psychiatry: Judgement and insight appear normal. Mood & affect appropriate.     Data Reviewed: I have personally  reviewed following labs and imaging studies  CBC:  Recent Labs Lab 07/25/16 0300 07/27/16 0400  WBC 7.4 6.8  HGB 10.3* 10.4*  HCT 38.0 37.8  MCV 96.4 94.3  PLT 200 [id number]   Basic Metabolic Panel:  Recent Labs Lab 07/26/16 0428 07/27/16 0400 07/28/16 0934 07/30/16 0010 07/31/16 0945  NA 153* 152* 147* 145 144  K 4.0 3.6 3.5 3.9 3.8  CL 105 105 104 105 100*  CO2 42* 42* 39* 38* 38*  GLUCOSE 191* 182* 160* 125* 83  BUN 34* 37* 25* 16 14  CREATININE 1.23* 1.36* 1.12* 0.97 1.02*  CALCIUM 9.0 9.0 8.7* 8.6* 8.8*   GFR: Estimated Creatinine Clearance: 56.5 mL/min (by C-G formula based on SCr of 1.02 mg/dL (H)). Liver Function Tests: No results for input(s): AST, ALT, ALKPHOS, BILITOT, PROT, ALBUMIN in the last 168 hours. No results for input(s): LIPASE, AMYLASE in the last 168 hours. No results for input(s): AMMONIA in the last 168 hours. Coagulation Profile: No results for input(s): INR, PROTIME in the last 168 hours. Cardiac Enzymes: No results for input(s): CKTOTAL, CKMB, CKMBINDEX, TROPONINI in the last 168 hours. BNP (last 3 results) No results for input(s): PROBNP in the last 8760 hours. HbA1C: No results for input(s): HGBA1C in the last 72 hours. CBG:  Recent Labs Lab 07/30/16 1945 07/30/16 2352 07/31/16 0405 07/31/16 0806 07/31/16 1128  GLUCAP 112* 115* 103* 91 86   Lipid Profile: No results for input(s): CHOL, HDL, LDLCALC, TRIG, CHOLHDL, LDLDIRECT in the last 72 hours. Thyroid Function Tests: No results for input(s): TSH, T4TOTAL, FREET4, T3FREE, THYROIDAB in the last 72 hours. Anemia Panel: No results for input(s): VITAMINB12, FOLATE, FERRITIN, TIBC, IRON, RETICCTPCT in the last 72 hours. Sepsis Labs: No results for input(s): PROCALCITON, LATICACIDVEN in the last 168 hours.  Recent Results (from the past 240 hour(s))  Culture, Urine     Status: None   Collection Time: 07/22/16 10:21 AM  Result Value Ref Range Status   Specimen Description URINE,  CATHETERIZED  Final   Special Requests NONE  Final   Culture NO GROWTH Performed at [facility]   Final   Report Status 07/23/2016 FINAL  Final  Culture, blood (Routine X 2) w Reflex to ID Panel     Status: None   Collection Time: 07/23/16 10:17 AM  Result Value Ref Range Status   Specimen Description BLOOD LEFT ARM  Final   Special Requests IN PEDIATRIC BOTTLE 3CC  Final   Culture   Final    NO GROWTH 5 DAYS Performed at [facility]    Report Status 07/28/2016 FINAL  Final  Culture, blood (Routine X 2) w Reflex to ID Panel     Status: None   Collection Time: 07/23/16 10:19 AM  Result Value Ref Range Status   Specimen Description BLOOD LEFT HAND  Final   Special Requests IN PEDIATRIC BOTTLE 4CC  Final   Culture   Final    NO GROWTH 5 DAYS Performed at [facility]    Report Status 07/28/2016 FINAL  Final         Radiology Studies: Dg Chest [place] [address]  Result Date: 07/30/2016 CLINICAL DATA:  Fevers and weakness EXAM: PORTABLE CHEST 1 VIEW COMPARISON:  07/25/2016 FINDINGS: Endotracheal tube and nasogastric catheter been removed in the interval. Right-sided PICC line remains in place. Cardiac shadow remains enlarged. The overall inspiratory effort is poor. Persistent congestive failure is again noted. IMPRESSION: Stable vascular congestion. No new focal abnormality is seen. Electronically Signed   By: Inez Catalina M.D.   On: 07/30/2016 13:28        Scheduled Meds: . aspirin  81 mg Oral Daily  . DULoxetine  40 mg Oral Daily  . fluticasone  2 spray Each Nare QHS  . furosemide  40 mg Oral Daily  . heparin  5,000 Units Subcutaneous Q8H  . insulin aspart  0-15 Units Subcutaneous Q4H  . loratadine  10 mg Oral Daily  . magnesium oxide  400 mg Oral TID  . mometasone-formoterol  2 puff Inhalation BID  . protein supplement shake  11 oz Oral BID BM  . sodium chloride flush  10-40 mL Intracatheter Q12H   Continuous Infusions:    LOS: 11 days     Time spent: 47 minutes    THOMPSON,DANIEL, MD [facility] Pager [phone]  If 7PM-7AM, please contact night-coverage [email or web address] Password [email or web address] 07/31/2016, 12:06 PM

## 2016-07-31 NOTE — Progress Notes (Signed)
Physical Therapy Treatment Patient Details Name: Heather Zimmerman. [name] MRN: [id number] DOB: 07-07-1946 Today's Date: 07/31/2016    History of Present Illness Pt admitted from SNF with acute resp failure and hx of CHF, DM, L BKA, and chronic LE venous stasis ulcers    PT Comments    Performed side to side bed rolling and supine to sit.  Pt required + 2 assist but is able to self assist using rails and momentum.  Once upright, pt able to sit EOB Indep.  Tolerated EOB x 12 min.  Avg HR 87.  No distress.  Allowed pt to self wash her face and assisted with washing her back.  Pt stated she sits in a wheel chair most of the day.  Assisted back to supine and positioned to comfort.  Pt plans to D/C to SNF.   Follow Up Recommendations  SNF     Equipment Recommendations  None recommended by PT    Recommendations for Other Services       Precautions / Restrictions Precautions Precautions: Fall Restrictions Weight Bearing Restrictions: Yes RLE Weight Bearing: Non weight bearing Other Position/Activity Restrictions: R BKA    Mobility  Bed Mobility Overal bed mobility: Needs Assistance Bed Mobility: Rolling;Sidelying to Sit;Supine to Sit;Sit to Supine;Sit to Sidelying Rolling: +2 for physical assistance;Max assist Sidelying to sit: Max assist;+2 for physical assistance Supine to sit: Max assist Sit to supine: Max assist;+2 for physical assistance;HOB elevated Sit to sidelying: Max assist;Total assist;+2 for physical assistance General bed mobility comments: cues for sequence and assist to manouver LEs and bring trunk to upright. Pt utilizing bed rails to self assist44momentum)  Transfers                 General transfer comment: [employer] only  Ambulation/Gait                 Stairs            Wheelchair Mobility    Modified Rankin (Stroke Patients Only)       Balance                                    Cognition Arousal/Alertness:  Awake/alert Behavior During Therapy: WFL for tasks assessed/performed Overall Cognitive Status: Within Functional Limits for tasks assessed                      Exercises      General Comments        Pertinent Vitals/Pain Pain Assessment: No/denies pain    Home Living                      Prior Function            PT Goals (current goals can now be found in the care plan section) Progress towards PT goals: Progressing toward goals    Frequency    Min 3X/week      PT Plan Current plan remains appropriate    Co-evaluation             End of Session   Activity Tolerance: Patient tolerated treatment well Patient left: in bed;with call bell/phone within reach     Time: 1535-1600 PT Time Calculation (min) (ACUTE ONLY): 25 min  Charges:  $Therapeutic Activity: 23-37 mins  G Codes:      Heather Zimmerman  PTA [facility]  Acute  Rehab Pager      [phone]

## 2016-07-31 NOTE — Progress Notes (Signed)
[place] [facility]   Reason for visit: Follow up on fever  Interval History: no further fever since the one yesterday, no new symptoms  Physical Exam: Constitutional:  Vitals:   07/31/16 0441 07/31/16 1520  BP: (!) 128/55 (!) 122/55  Pulse: 62 73  Resp: 18 18  Temp: 98.8 F (37.1 C) 98.4 F (36.9 C)   patient appears in NAD Respiratory: Normal respiratory effort; CTA B MS: right leg AKA; left leg with chronic vascular changes, no erythema, no warmth  Review of Systems: Constitutional: negative for chills and anorexia Genitourinary: negative for frequency, dysuria and hesitancy Musculoskeletal: negative for myalgias  Lab Results  Component Value Date   WBC 6.8 07/27/2016   HGB 10.4 (L) 07/27/2016   HCT 37.8 07/27/2016   MCV 94.3 07/27/2016   PLT 210 07/27/2016    Lab Results  Component Value Date   CREATININE 1.02 (H) 07/31/2016   BUN 14 07/31/2016   NA 144 07/31/2016   K 3.8 07/31/2016   CL 100 (L) 07/31/2016   CO2 38 (H) 07/31/2016    Lab Results  Component Value Date   ALT 16 07/19/2016   AST 19 07/19/2016   ALKPHOS 71 07/19/2016     Microbiology: Recent Results (from the past 240 hour(s))  Culture, Urine     Status: None   Collection Time: 07/22/16 10:21 AM  Result Value Ref Range Status   Specimen Description URINE, CATHETERIZED  Final   Special Requests NONE  Final   Culture NO GROWTH Performed at [facility]   Final   Report Status 07/23/2016 FINAL  Final  Culture, blood (Routine X 2) w Reflex to ID Panel     Status: None   Collection Time: 07/23/16 10:17 AM  Result Value Ref Range Status   Specimen Description BLOOD LEFT ARM  Final   Special Requests IN PEDIATRIC BOTTLE 3CC  Final   Culture   Final    NO GROWTH 5 DAYS Performed at [facility]    Report Status 07/28/2016 FINAL  Final  Culture, blood (Routine X 2) w Reflex to ID Panel     Status: None   Collection Time: 07/23/16 10:19 AM  Result  Value Ref Range Status   Specimen Description BLOOD LEFT HAND  Final   Special Requests IN PEDIATRIC BOTTLE 4CC  Final   Culture   Final    NO GROWTH 5 DAYS Performed at [facility]    Report Status 07/28/2016 FINAL  Final  Culture, blood (routine x 2)     Status: None (Preliminary result)   Collection Time: 07/30/16 12:47 PM  Result Value Ref Range Status   Specimen Description BLOOD LEFT HAND  Final   Special Requests IN PEDIATRIC BOTTLE [place]  Final   Culture   Final    NO GROWTH < 24 HOURS Performed at [facility]    Report Status PENDING  Incomplete  Culture, blood (routine x 2)     Status: None (Preliminary result)   Collection Time: 07/30/16 12:47 PM  Result Value Ref Range Status   Specimen Description BLOOD LEFT ARM  Final   Special Requests BOTTLES DRAWN AEROBIC ONLY 5CC  Final   Culture   Final    NO GROWTH < 24 HOURS Performed at [facility]    Report Status PENDING  Incomplete    Impression/Plan:  1. Fever - resolved at this time.  No antibiotics.   2. Bacteremia -  resolved.  Repeat blood cultures sent again due to fever.  Will monitor.    I will sign off, please call with questions

## 2016-07-31 NOTE — Progress Notes (Addendum)
Patient does not want to return to [facility].  LCSWA met with patient at bedside and provided patient with new SNF list. The patient reports she is not familiar with any of the facilities listed. LCSWA contacted patient brother he has agreed to assist patient with facility choices. He plans to meet her at hospital today. The brother understands the patient may D/C today and patient will have to go to a SNF.  [employer] has agreed to meet with patient today.

## 2016-08-01 DIAGNOSIS — L03116 Cellulitis of left lower limb: Secondary | ICD-10-CM | POA: Diagnosis not present

## 2016-08-01 DIAGNOSIS — N183 Chronic kidney disease, stage 3 (moderate): Secondary | ICD-10-CM | POA: Diagnosis not present

## 2016-08-01 DIAGNOSIS — I509 Heart failure, unspecified: Secondary | ICD-10-CM | POA: Diagnosis not present

## 2016-08-01 DIAGNOSIS — Z7982 Long term (current) use of aspirin: Secondary | ICD-10-CM | POA: Diagnosis not present

## 2016-08-01 DIAGNOSIS — R0902 Hypoxemia: Secondary | ICD-10-CM | POA: Diagnosis not present

## 2016-08-01 DIAGNOSIS — N390 Urinary tract infection, site not specified: Secondary | ICD-10-CM | POA: Diagnosis present

## 2016-08-01 DIAGNOSIS — Z6841 Body Mass Index (BMI) 40.0 and over, adult: Secondary | ICD-10-CM | POA: Diagnosis not present

## 2016-08-01 DIAGNOSIS — E1143 Type 2 diabetes mellitus with diabetic autonomic (poly)neuropathy: Secondary | ICD-10-CM | POA: Diagnosis not present

## 2016-08-01 DIAGNOSIS — Z888 Allergy status to other drugs, medicaments and biological substances status: Secondary | ICD-10-CM | POA: Diagnosis not present

## 2016-08-01 DIAGNOSIS — G92 Toxic encephalopathy: Secondary | ICD-10-CM | POA: Diagnosis present

## 2016-08-01 DIAGNOSIS — L97229 Non-pressure chronic ulcer of left calf with unspecified severity: Secondary | ICD-10-CM | POA: Diagnosis not present

## 2016-08-01 DIAGNOSIS — I872 Venous insufficiency (chronic) (peripheral): Secondary | ICD-10-CM | POA: Diagnosis not present

## 2016-08-01 DIAGNOSIS — K219 Gastro-esophageal reflux disease without esophagitis: Secondary | ICD-10-CM | POA: Diagnosis present

## 2016-08-01 DIAGNOSIS — R7881 Bacteremia: Secondary | ICD-10-CM | POA: Diagnosis not present

## 2016-08-01 DIAGNOSIS — E119 Type 2 diabetes mellitus without complications: Secondary | ICD-10-CM | POA: Diagnosis not present

## 2016-08-01 DIAGNOSIS — R000 Tachycardia, unspecified: Secondary | ICD-10-CM | POA: Diagnosis present

## 2016-08-01 DIAGNOSIS — E1140 Type 2 diabetes mellitus with diabetic neuropathy, unspecified: Secondary | ICD-10-CM | POA: Diagnosis not present

## 2016-08-01 DIAGNOSIS — R1312 Dysphagia, oropharyngeal phase: Secondary | ICD-10-CM | POA: Diagnosis not present

## 2016-08-01 DIAGNOSIS — I83029 Varicose veins of left lower extremity with ulcer of unspecified site: Secondary | ICD-10-CM | POA: Diagnosis not present

## 2016-08-01 DIAGNOSIS — J9620 Acute and chronic respiratory failure, unspecified whether with hypoxia or hypercapnia: Secondary | ICD-10-CM | POA: Diagnosis not present

## 2016-08-01 DIAGNOSIS — I110 Hypertensive heart disease with heart failure: Secondary | ICD-10-CM | POA: Diagnosis present

## 2016-08-01 DIAGNOSIS — I878 Other specified disorders of veins: Secondary | ICD-10-CM | POA: Diagnosis present

## 2016-08-01 DIAGNOSIS — J9621 Acute and chronic respiratory failure with hypoxia: Secondary | ICD-10-CM | POA: Diagnosis not present

## 2016-08-01 DIAGNOSIS — R7889 Finding of other specified substances, not normally found in blood: Secondary | ICD-10-CM | POA: Diagnosis not present

## 2016-08-01 DIAGNOSIS — I130 Hypertensive heart and chronic kidney disease with heart failure and stage 1 through stage 4 chronic kidney disease, or unspecified chronic kidney disease: Secondary | ICD-10-CM | POA: Diagnosis not present

## 2016-08-01 DIAGNOSIS — I5033 Acute on chronic diastolic (congestive) heart failure: Secondary | ICD-10-CM | POA: Diagnosis not present

## 2016-08-01 DIAGNOSIS — G894 Chronic pain syndrome: Secondary | ICD-10-CM | POA: Diagnosis not present

## 2016-08-01 DIAGNOSIS — N179 Acute kidney failure, unspecified: Secondary | ICD-10-CM | POA: Diagnosis present

## 2016-08-01 DIAGNOSIS — E11649 Type 2 diabetes mellitus with hypoglycemia without coma: Secondary | ICD-10-CM | POA: Diagnosis present

## 2016-08-01 DIAGNOSIS — D631 Anemia in chronic kidney disease: Secondary | ICD-10-CM | POA: Diagnosis not present

## 2016-08-01 DIAGNOSIS — I7389 Other specified peripheral vascular diseases: Secondary | ICD-10-CM | POA: Diagnosis not present

## 2016-08-01 DIAGNOSIS — J9602 Acute respiratory failure with hypercapnia: Secondary | ICD-10-CM | POA: Diagnosis not present

## 2016-08-01 DIAGNOSIS — Z23 Encounter for immunization: Secondary | ICD-10-CM | POA: Diagnosis not present

## 2016-08-01 DIAGNOSIS — I248 Other forms of acute ischemic heart disease: Secondary | ICD-10-CM | POA: Diagnosis present

## 2016-08-01 DIAGNOSIS — E662 Morbid (severe) obesity with alveolar hypoventilation: Secondary | ICD-10-CM | POA: Diagnosis present

## 2016-08-01 DIAGNOSIS — G8929 Other chronic pain: Secondary | ICD-10-CM | POA: Diagnosis present

## 2016-08-01 DIAGNOSIS — J9622 Acute and chronic respiratory failure with hypercapnia: Secondary | ICD-10-CM | POA: Diagnosis not present

## 2016-08-01 DIAGNOSIS — G9341 Metabolic encephalopathy: Secondary | ICD-10-CM | POA: Diagnosis not present

## 2016-08-01 DIAGNOSIS — A419 Sepsis, unspecified organism: Secondary | ICD-10-CM | POA: Diagnosis not present

## 2016-08-01 DIAGNOSIS — G4733 Obstructive sleep apnea (adult) (pediatric): Secondary | ICD-10-CM | POA: Diagnosis not present

## 2016-08-01 DIAGNOSIS — E1122 Type 2 diabetes mellitus with diabetic chronic kidney disease: Secondary | ICD-10-CM | POA: Diagnosis present

## 2016-08-01 DIAGNOSIS — L97929 Non-pressure chronic ulcer of unspecified part of left lower leg with unspecified severity: Secondary | ICD-10-CM | POA: Diagnosis present

## 2016-08-01 DIAGNOSIS — Z794 Long term (current) use of insulin: Secondary | ICD-10-CM | POA: Diagnosis not present

## 2016-08-01 DIAGNOSIS — R4781 Slurred speech: Secondary | ICD-10-CM | POA: Diagnosis not present

## 2016-08-01 DIAGNOSIS — E871 Hypo-osmolality and hyponatremia: Secondary | ICD-10-CM | POA: Diagnosis not present

## 2016-08-01 DIAGNOSIS — R498 Other voice and resonance disorders: Secondary | ICD-10-CM | POA: Diagnosis not present

## 2016-08-01 DIAGNOSIS — J9600 Acute respiratory failure, unspecified whether with hypoxia or hypercapnia: Secondary | ICD-10-CM | POA: Diagnosis not present

## 2016-08-01 DIAGNOSIS — Z8249 Family history of ischemic heart disease and other diseases of the circulatory system: Secondary | ICD-10-CM | POA: Diagnosis not present

## 2016-08-01 DIAGNOSIS — I10 Essential (primary) hypertension: Secondary | ICD-10-CM | POA: Diagnosis not present

## 2016-08-01 DIAGNOSIS — I87312 Chronic venous hypertension (idiopathic) with ulcer of left lower extremity: Secondary | ICD-10-CM | POA: Diagnosis not present

## 2016-08-01 DIAGNOSIS — Z7951 Long term (current) use of inhaled steroids: Secondary | ICD-10-CM | POA: Diagnosis not present

## 2016-08-01 DIAGNOSIS — A411 Sepsis due to other specified staphylococcus: Secondary | ICD-10-CM | POA: Diagnosis present

## 2016-08-01 DIAGNOSIS — E1165 Type 2 diabetes mellitus with hyperglycemia: Secondary | ICD-10-CM | POA: Diagnosis not present

## 2016-08-01 DIAGNOSIS — I5032 Chronic diastolic (congestive) heart failure: Secondary | ICD-10-CM | POA: Diagnosis not present

## 2016-08-01 DIAGNOSIS — E1149 Type 2 diabetes mellitus with other diabetic neurological complication: Secondary | ICD-10-CM | POA: Diagnosis not present

## 2016-08-01 DIAGNOSIS — J9612 Chronic respiratory failure with hypercapnia: Secondary | ICD-10-CM | POA: Diagnosis not present

## 2016-08-01 DIAGNOSIS — J9611 Chronic respiratory failure with hypoxia: Secondary | ICD-10-CM | POA: Diagnosis present

## 2016-08-01 DIAGNOSIS — Z79899 Other long term (current) drug therapy: Secondary | ICD-10-CM | POA: Diagnosis not present

## 2016-08-01 DIAGNOSIS — R0602 Shortness of breath: Secondary | ICD-10-CM | POA: Diagnosis present

## 2016-08-01 DIAGNOSIS — E162 Hypoglycemia, unspecified: Secondary | ICD-10-CM

## 2016-08-01 DIAGNOSIS — F329 Major depressive disorder, single episode, unspecified: Secondary | ICD-10-CM | POA: Diagnosis not present

## 2016-08-01 DIAGNOSIS — J811 Chronic pulmonary edema: Secondary | ICD-10-CM | POA: Diagnosis not present

## 2016-08-01 DIAGNOSIS — J189 Pneumonia, unspecified organism: Secondary | ICD-10-CM | POA: Diagnosis not present

## 2016-08-01 DIAGNOSIS — E872 Acidosis: Secondary | ICD-10-CM | POA: Diagnosis present

## 2016-08-01 DIAGNOSIS — Z89511 Acquired absence of right leg below knee: Secondary | ICD-10-CM | POA: Diagnosis not present

## 2016-08-01 DIAGNOSIS — E1151 Type 2 diabetes mellitus with diabetic peripheral angiopathy without gangrene: Secondary | ICD-10-CM | POA: Diagnosis present

## 2016-08-01 DIAGNOSIS — E870 Hyperosmolality and hypernatremia: Secondary | ICD-10-CM | POA: Diagnosis present

## 2016-08-01 DIAGNOSIS — M6281 Muscle weakness (generalized): Secondary | ICD-10-CM | POA: Diagnosis not present

## 2016-08-01 LAB — BASIC METABOLIC PANEL
ANION GAP: 6 (ref 5–15)
BUN: 13 mg/dL (ref 6–20)
CHLORIDE: 97 mmol/L — AB (ref 101–111)
CO2: 37 mmol/L — ABNORMAL HIGH (ref 22–32)
Calcium: 8.7 mg/dL — ABNORMAL LOW (ref 8.9–10.3)
Creatinine, Ser: 1 mg/dL (ref 0.44–1.00)
GFR calc Af Amer: >60 mL/min (ref >60)
GFR, EST NON AFRICAN AMERICAN: 55 mL/min — AB (ref >60)
GLUCOSE: 118 mg/dL — AB (ref 65–99)
POTASSIUM: 4.3 mmol/L (ref 3.5–5.1)
Sodium: 140 mmol/L (ref 135–145)

## 2016-08-01 LAB — GLUCOSE, CAPILLARY
GLUCOSE-CAPILLARY: 140 mg/dL — AB (ref 65–99)
GLUCOSE-CAPILLARY: 164 mg/dL — AB (ref 65–99)
GLUCOSE-CAPILLARY: 215 mg/dL — AB (ref 65–99)
GLUCOSE-CAPILLARY: 96 mg/dL (ref 65–99)
Glucose-Capillary: 115 mg/dL — ABNORMAL HIGH (ref 65–99)

## 2016-08-01 MED ORDER — METOCLOPRAMIDE HCL 5 MG/ML IJ SOLN
10.0000 mg | Freq: Once | INTRAMUSCULAR | Status: AC
  Administered 2016-08-01: 10 mg via INTRAVENOUS
  Filled 2016-08-01: qty 2

## 2016-08-01 MED ORDER — INSULIN ASPART 100 UNIT/ML FLEXPEN: 0.0000 [IU] | PEN_INJECTOR | Freq: Three times a day (TID) | SUBCUTANEOUS | 3 refills | Status: DC

## 2016-08-01 MED ORDER — PREMIER PROTEIN SHAKE: 11.0000 [oz_av] | Freq: Two times a day (BID) | ORAL | 0 refills | Status: DC

## 2016-08-01 MED ORDER — TRAMADOL HCL 50 MG PO TABS
50.0000 mg | ORAL_TABLET | Freq: Four times a day (QID) | ORAL | Status: DC | PRN
  Administered 2016-08-01: 50 mg via ORAL
  Filled 2016-08-01: qty 1

## 2016-08-01 MED ORDER — INSULIN GLARGINE 100 UNIT/ML SOLOSTAR PEN: 26.0000 [IU] | PEN_INJECTOR | Freq: Every day | SUBCUTANEOUS | 0 refills | Status: DC

## 2016-08-01 NOTE — Progress Notes (Signed)
Speech Language Pathology Treatment:    Patient Details Name: Heather Zimmerman. Heather Zimmerman MRN: [id number] DOB: [birth year] Today's Date: 08/01/2016 Time: 9093-1121 SLP Time Calculation (min) (ACUTE ONLY): 21 min  Assessment / Plan / Recommendation Clinical Impression  Pt today with stable WBC, vitals and swallow function.  She is dysarthric today but was amenable to compensation strategies of overarticulation.  No indication of airway compromise with po observed including medicine with water, water alone.  Due to dysarthria, SLP recommends to continue Dys2/thin diet.  Informed pt of need to strengthen cough/phonation for maximal airway protection.   Education completed and pt tolerating po diet.  Recommend follow up at SNF for dysphagia management/pharyngeal/respiratory strengthening.     HPI HPI: 70 yo female adm to [facility] with AMS, hypoxia, required intubation.  PMH + for CHF, DM type 2, sleep apnea, hypoventilation syndrome, obesity, chronic venous stasis.  Pt was extubated today at 0915 am and swallow evaluation ordered.  CXR showed no airspace consolidation.        SLP Plan  All goals met     Recommendations  Diet recommendations: Dysphagia 2 (fine chop);Thin liquid Liquids provided via: Cup;Straw Medication Administration: Whole meds with liquid (or with puree if problematic or lethargic) Supervision: Patient able to self feed Compensations: Slow rate;Small sips/bites;Follow solids with liquid Postural Changes and/or Swallow Maneuvers: Seated upright 90 degrees;Upright 30-60 min after meal                Oral Care Recommendations: Oral care BID Follow up Recommendations: Skilled Nursing facility (no acute care SLP follow up, recommend follow up at SNF for muscle strength training to faciliate pulmonary clearance with dysphagia) Plan: All goals met       [place], [place], [place] [facility] SLP [phone]

## 2016-08-01 NOTE — Progress Notes (Addendum)
D/C Summary Faxed.  Nurse Given Report. PTAR called for transport

## 2016-08-01 NOTE — Discharge Summary (Signed)
Physician Discharge Summary  Heather Zimmerman. [name] [id number] DOB: September 06, 1946 DOA: 07/19/2016  PCP: Heather Cranker, DO  Admit date: 07/19/2016 Discharge date: 08/01/2016  Time spent: 65 minutes  Recommendations for Outpatient Follow-up:  1. Follow-up with M.D. at skilled nursing facility. Patient will need a basic metabolic profile done in 1 week to follow-up on electrolytes and renal function. Patient's CBGs will need to be reassessed more frequently and titration of patient's Lantus will be deferred to M.D. at the skilled nursing facility.   Discharge Diagnoses:  Active Problems:   Respiratory failure (HCC)   Acute respiratory failure with hypercapnia (HCC)   Coag negative Staphylococcus bacteremia   Chronic venous stasis dermatitis   Acute on chronic diastolic congestive heart failure (HCC)   Oropharyngeal dysphagia   Bacteremia   Hypoglycemia   Discharge Condition: Stable and improved  Diet recommendation: Heart healthy/dysphagia 2 diet with thin liquids  Filed Weights   07/30/16 0409 07/31/16 0500 08/01/16 0500  Weight: 124.3 kg (274 lb) 118.8 kg (262 lb) 118.5 kg (261 lb 4.8 oz)    History of present illness:  Per Dr Heather Zimmerman is a 70-y/o female with PMH significant for dCHF, T2DM, sleep apnea not on regular cpap, 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 Duration of symptoms unknown. O2 sats 63% by EMS report but improved to 99% on non-rebreather. Per ED, initial GCS was 8, and patient was tachypneic with accessory muscle use. Per ED nurse, she initially was oriented at least to person and month but speech quickly began not to make sense. She was placed on bipap but continued to have increased WOB and decreased alertness so was intubated in the ED. Glucose was low at 52, so 1 amp of D50 given. CXR with evidence of pulmonary edema. 40 mg IV lasix given. No  elevated LA or leukocytosis to suggest infection. ABG showed severe compensated respiratory acidosis with pH 7.3, pCO2 106, pO2 77.2, bicarb 50.6 (appears to have been obtained after intubation). BNP 51.1, slightly elevated from previous (39.7). SCr at baseline.   Of note, patient's insulin regimen had been increased significantly over the summer, from lantus 26 U daily with 5-10 U novolog with meals to lantus 42 U nightly, 26 U novolog with meals. Had ED visit 07/07/16 for episode of AMS and hypoglycemia to 46 that improved after administration of D50. She has had recent hospitalizations during which IV diuresis has improved respiratory status. Home meds included 20 mg torsemide 2 times daily.   Hospital Course:  #1 acute on chronic hypercarbic respiratory failure secondary to OSA/OHS and acute on chronic diastolic CHF exacerbation Patient noted to be in acute on chronic hypercarbic respiratory failure on admission and was intubated secondary to altered mental status and shortness of breath. Patient was admitted to the ICU and managed by the critical care. Patient improved with diureses and subsequently extubated and transferred to the floor. Patient was subsequent transferred [facility]. Patient improved on a daily basis and transitioned to oral Lasix. Was recommended per pulmonary the patient needs BiPAP at night and when necessary during the day. Per nursing patient has been refusing BIPAP. Patient remained in stable condition of patient be discharged in stable and improved condition. Outpatient follow-up with M.D. at skilled nursing facility.  #2 acute on chronic diastolic heart failure/history of peripheral vascular disease Patient noted on admission to be in acute on chronic diastolic  heart failure with also history of peripheral vascular disease. Patient was diuresed with IV Lasix and was -2.997 L during this hospitalization. Patient improved clinically and was subsequently  transitioned to oral Lasix. Patient be discharged home on home dose Demadex. Patient will follow-up in the outpatient setting.   #3 hypernatremia Improved.  #4 sepsis from left lower extremity cellulitis and coagulase negative staph bacteremia Patient was admitted with altered mental status and noted to be in hypercarbic respiratory failure. Patient was pancultured and noted to have a coagulase negative staph bacteremia. Patient was also noted to have a left lower extremity cellulitis. Patient was received IV vancomycin, IV Fortaz, IV cefepime during this hospitalization was adequately treated. Patient was seen in consultation by infectious diseases. Repeat blood cultures were obtained with no growth to date. Antibiotics was subsequently discontinued and patient monitored. Patient spiked a fever 2-3 days prior to discharge which resolved. Repeat blood cultures were obtained with no growth to date. Patient remained afebrile throughout the rest of the hospitalization be discharged in stable and improved condition.   #5 diabetes mellitus type 2 On admission patient was noted to have a hypoglycemic spell. It was noted per admitting physician that patient's insulin regimen was changed from Lantus 26 units daily with 5-10 units of NovoLog with meals to 42 units daily with 26 units of NovoLog with meals. Patient was noted to have a visit to the ED on 07/07/2016 with hypoglycemic spell with a CBG of 46 that improved after administration of D50. Patient's Lantus was held during the hospitalization and patient maintained on a sliding scale insulin. Patient be discharged back on half home dose of Lantus at 26 units daily with a sliding scale insulin. Outpatient follow-up.  #6 acute metabolic encephalopathy Resolved with treatment of bacteremia and acute CHF exacerbation and resolution of hypoglycemia.   #7 deconditioning  PT/OT. Patient will be discharged to skilled nursing  facility.   Procedures:  Chest x-ray 07/19/2016, 07/20/2016, 07/21/2016, 07/22/2016, 07/23/2016, 07/24/2016, 07/25/2016, 07/30/2016  2-D echo 07/22/2016  Consultations:  Wound care  Infectious diseases Dr. Baxter Flattery 07/28/2016  Discharge Exam: Vitals:   08/01/16 0010 08/01/16 0555  BP:  116/70  Pulse:  74  Resp: 18 18  Temp:  98.3 F (36.8 C)    General: NAD Cardiovascular: RRR Respiratory: CTAB anterior lung fileds.  Discharge Instructions   Discharge Instructions    Diet - low sodium heart healthy    Complete by:  As directed    Increase activity slowly    Complete by:  As directed      Current Discharge Medication List    START taking these medications   Details  protein supplement shake (PREMIER PROTEIN) LIQD Take 325 mLs (11 oz total) by mouth 2 (two) times daily between meals. Refills: 0      CONTINUE these medications which have CHANGED   Details  insulin aspart (NOVOLOG FLEXPEN) 100 UNIT/ML FlexPen Inject 0-15 Units into the skin 3 (three) times daily with meals. Inject as per sliding scale: if  0-120=0units ; 121-150 = 2 units; 151-200=3 units;201-250 = 5 units; 251-300= 8 units; 301-350= 11 units; 351-400=15 units. Qty: 15 mL, Refills: 3    Insulin Glargine (LANTUS SOLOSTAR) 100 UNIT/ML Solostar Pen Inject 26 Units into the skin daily at 10 pm. Qty: 15 mL, Refills: 0      CONTINUE these medications which have NOT CHANGED   Details  acetaminophen (TYLENOL) 650 MG CR tablet Take 650 mg by mouth every 6 (six) hours  as needed for pain.    Amino Acids-Protein Hydrolys (FEEDING SUPPLEMENT, PRO-STAT 64,) LIQD Take 30 mLs by mouth 2 (two) times daily.    antiseptic oral rinse (BIOTENE) LIQD 15 mLs by Mouth Rinse route as needed for dry mouth.    aspirin 81 MG tablet Take 81 mg by mouth daily.    camphor-menthol (SARNA) lotion Apply 1 application topically every 8 (eight) hours as needed for itching.    cholecalciferol (VITAMIN D) 1000 UNITS tablet  Take 2,000 Units by mouth daily.    DULoxetine (CYMBALTA) 20 MG capsule Take 40 mg by mouth daily.     Eyelid Cleansers (OCUSOFT EYELID CLEANSING) PADS Apply topically daily.     fluticasone (FLONASE) 50 MCG/ACT nasal spray Place 2 sprays into both nostrils at bedtime.    Fluticasone-Salmeterol (ADVAIR) 250-50 MCG/DOSE AEPB Inhale 1 puff into the lungs every 12 (twelve) hours. Reported on 04/25/2016    gabapentin (NEURONTIN) 600 MG tablet Take 600 mg by mouth 2 (two) times daily.    glucagon (GLUCAGON EMERGENCY) 1 MG injection Inject 1 mg into the vein once as needed (blood sugar).    ipratropium-albuterol (DUONEB) 0.5-2.5 (3) MG/3ML SOLN Take 3 mLs by nebulization every 6 (six) hours as needed (shortness of breath). Qty: 360 mL    magnesium hydroxide (MILK OF MAGNESIA) 400 MG/5ML suspension Take 30 mLs by mouth daily as needed for mild constipation.    magnesium oxide (MAG-OX) 400 MG tablet Take 400 mg by mouth 3 (three) times daily.     potassium chloride SA (K-DUR,KLOR-CON) 20 MEQ tablet Take 20 mEq by mouth daily.    torsemide (DEMADEX) 20 MG tablet Take 20 mg by mouth 2 (two) times daily.    vitamin C (ASCORBIC ACID) 500 MG tablet Take 500 mg by mouth 2 (two) times daily.    Zinc Sulfate 110 MG TABS Take 2 tablets by mouth daily.    cetirizine (ZYRTEC) 10 MG tablet Take 10 mg by mouth as needed for allergies.       Allergies  Allergen Reactions  . Ace Inhibitors     unknown   Follow-up Information    md at SNF .   Why:  F/U WITH MD AT SNF           The results of significant diagnostics from this hospitalization (including imaging, microbiology, ancillary and laboratory) are listed below for reference.    Significant Diagnostic Studies: Dg Chest 2 View  Result Date: 07/07/2016 CLINICAL DATA:  Dyspnea, nonsmoker.  Diabetic. EXAM: CHEST  2 VIEW COMPARISON:  12/22/2015 FINDINGS: Stable cardiomegaly. Atherosclerosis of the aortic arch. No thoracic aortic aneurysm.  Low lung volumes again noted. Pulmonary vascular engorgement consistent with mild to moderate CHF. No pleural effusion. Right glenohumeral joint osteoarthritis. IMPRESSION: Mild to moderate CHF.  Stable cardiomegaly. Electronically Signed   By: Ashley Royalty M.D.   On: 07/07/2016 17:14   Ct Head Wo Contrast  Result Date: 07/07/2016 CLINICAL DATA:  Headache and altered mental status EXAM: CT HEAD WITHOUT CONTRAST TECHNIQUE: Contiguous axial images were obtained from the base of the skull through the vertex without intravenous contrast. COMPARISON:  October 03, 2015 FINDINGS: Brain: The ventricles are normal in size and configuration. There is no intracranial mass, hemorrhage, extra-axial fluid collection, or midline shift. Gray-white compartments appear normal. No acute infarct evident. Vascular: No hyperdense vessel evident. No vascular calcification evident. Skull: The bony calvarium appears intact. Sinuses/Orbits: There is diffuse opacification of the right maxillary antrum. There is a  small retention cyst in the medial left maxillary antrum. Other paranasal sinuses are clear. There is medial bowing of the medial orbital wall on the right, likely of posttraumatic etiology. Orbits otherwise appear unremarkable. Other: Mastoid air cells are clear. IMPRESSION: No intracranial mass, hemorrhage, or extra-axial fluid collection. Gray-white compartments are normal. Diffuse opacification noted of the right maxillary antrum. Bowing of the right medial orbital wall may be secondary to prior trauma. Electronically Signed   By: Lowella Grip III M.D.   On: 07/07/2016 18:05   Dg Chest Port 1 View  Result Date: 07/30/2016 CLINICAL DATA:  Fevers and weakness EXAM: PORTABLE CHEST 1 VIEW COMPARISON:  07/25/2016 FINDINGS: Endotracheal tube and nasogastric catheter been removed in the interval. Right-sided PICC line remains in place. Cardiac shadow remains enlarged. The overall inspiratory effort is poor. Persistent  congestive failure is again noted. IMPRESSION: Stable vascular congestion. No new focal abnormality is seen. Electronically Signed   By: Inez Catalina M.D.   On: 07/30/2016 13:28   Dg Chest Port 1 View  Result Date: 07/25/2016 CLINICAL DATA:  Hypoxia EXAM: PORTABLE CHEST 1 VIEW COMPARISON:  July 24, 2016 FINDINGS: Endotracheal tube tip is 3.3 cm above the carina. Central catheter tip is at the cavoatrial junction. Nasogastric tube tip and side port are below the diaphragm. No pneumothorax. There is persistent mild generalized interstitial edema. There is no airspace consolidation. There is stable cardiomegaly with pulmonary venous hypertension. There is atherosclerotic calcification in the aorta. No adenopathy. IMPRESSION: Tube and catheter positions as described without pneumothorax. Persistent mild interstitial edema with cardiomegaly and pulmonary venous hypertension consistent with a degree of congestive heart failure. No new opacity. There is aortic atherosclerosis. Electronically Signed   By: Lowella Grip III M.D.   On: 07/25/2016 07:16   Dg Chest Port 1 View  Result Date: 07/24/2016 CLINICAL DATA:  Hypoxia EXAM: PORTABLE CHEST 1 VIEW COMPARISON:  July 23, 2016 FINDINGS: Endotracheal tube tip is 4.0 cm above the carina. Central catheter tip is in the superior vena cava. Nasogastric tube tip and side port are below the diaphragm. No pneumothorax. There is stable mild generalized interstitial pulmonary edema. There is no airspace consolidation. There is cardiomegaly with pulmonary venous hypertension. There is aortic atherosclerosis. No adenopathy. IMPRESSION: Tube and catheter positions as described without pneumothorax. Evidence of a degree of congestive heart failure, stable. No new opacity. Aortic atherosclerosis noted. Electronically Signed   By: Lowella Grip III M.D.   On: 07/24/2016 07:57   Dg Chest Port 1 View  Result Date: 07/23/2016 CLINICAL DATA:  70 year old female with  respiratory failure. Subsequent encounter. EXAM: PORTABLE CHEST 1 VIEW COMPARISON:  07/22/2016. FINDINGS: Chin overlies the upper lung. Endotracheal tube tip appears to be 1.6 cm above the carina. Nasogastric tube curled within the stomach with the tip at the level of the gastric fundus. Cardiomegaly. Calcified tortuous aorta. Pulmonary vascular congestion/ pulmonary edema similar to prior exam. Poor inspiration without evidence gross pneumothorax. IMPRESSION: Endotracheal tube tip 1.6 cm above the carina. Poor inspiration with pulmonary vascular congestion/ pulmonary edema appearing similar to prior exam. Cardiomegaly. Aortic atherosclerosis. Electronically Signed   By: Genia Del M.D.   On: 07/23/2016 07:35   Dg Chest Port 1 View  Result Date: 07/22/2016 CLINICAL DATA:  Endotracheal tube placement, CHF EXAM: PORTABLE CHEST 1 VIEW COMPARISON:  07/21/2016 FINDINGS: Endotracheal tube with the tip 2.2 cm above the carina. Nasogastric tube coursing below the diaphragm not well visualized. Right sided PICC line with the tip  projecting over the SVC. Stable cardiomegaly. Bilateral interstitial thickening and enlargement of the central pulmonary vasculature. No right pleural effusion. Small left pleural effusion. No pneumothorax. No acute osseous abnormality. IMPRESSION: 1. Findings consistent with mild pulmonary edema. 2. Support lines and tubing in unchanged position. Electronically Signed   By: Kathreen Devoid   On: 07/22/2016 07:14   Dg Chest Port 1 View  Result Date: 07/21/2016 CLINICAL DATA:  Respiratory failure. EXAM: PORTABLE CHEST 1 VIEW COMPARISON:  07/20/2016. FINDINGS: Cardiomegaly. BILATERAL pulmonary opacities consistent with pulmonary edema. BILATERAL pneumonia not excluded, but considering the rapid worsening, edema is favored BILATERAL pleural effusions. Overall worsening aeration. ET tube has been advanced, the tip now lies at or near the carina. Tube should be withdrawn 2-3 cm and radiograph  repeated. A PICC line has been placed, RIGHT arm approach, and lies at the cavoatrial junction. Orogastric tube is coiled in the stomach IMPRESSION: Cardiomegaly with suspected pulmonary edema. ETT too low.  Withdrawn 2-3 cm. A call has been placed to the intensive care unit to speak to the patient's caregivers. Electronically Signed   By: Staci Righter M.D.   On: 07/21/2016 07:19   Dg Chest Port 1 View  Result Date: 07/20/2016 CLINICAL DATA:  Endotracheal tube placement EXAM: PORTABLE CHEST 1 VIEW COMPARISON:  07/19/2016 FINDINGS: Shallow inspiration. Cardiac enlargement without vascular congestion. Bilateral hilar prominence may represent infiltration or edema but hilar lymphadenopathy is not excluded. Follow-up with upright PA and lateral views of the chest when patient is able would be useful. No pleural effusions. No pneumothorax. Interval placement of endotracheal tube with tip measuring 2.6 cm above the carina. An enteric tube was placed with tip in the left upper quadrant consistent with location in the body of the stomach. IMPRESSION: Shallow inspiration. Cardiac enlargement. Infiltration or adenopathy demonstrated in the hilar regions. Appliances appear in satisfactory location. Electronically Signed   By: Lucienne Capers M.D.   On: 07/20/2016 02:17   Dg Chest Port 1 View  Result Date: 07/20/2016 CLINICAL DATA:  Acute onset of shortness of breath. Initial encounter. EXAM: PORTABLE CHEST 1 VIEW COMPARISON:  Chest radiograph performed 07/07/2016 FINDINGS: The lungs are hypoexpanded. Fluffy bilateral central airspace opacities raise concern for pulmonary edema, though pneumonia could have a similar appearance. No pleural effusion or pneumothorax is seen. The cardiomediastinal silhouette is borderline enlarged. No acute osseous abnormalities are seen. IMPRESSION: Lungs hypoexpanded. Fluffy bilateral central airspace opacification raises concern for pulmonary edema, though pneumonia could have a  similar appearance. This is worsened from the prior study. Borderline cardiomegaly. Electronically Signed   By: Garald Balding M.D.   On: 07/20/2016 00:44    Microbiology: Recent Results (from the past 240 hour(s))  Culture, blood (Routine X 2) w Reflex to ID Panel     Status: None   Collection Time: 07/23/16 10:17 AM  Result Value Ref Range Status   Specimen Description BLOOD LEFT ARM  Final   Special Requests IN PEDIATRIC BOTTLE 3CC  Final   Culture   Final    NO GROWTH 5 DAYS Performed at [facility]    Report Status 07/28/2016 FINAL  Final  Culture, blood (Routine X 2) w Reflex to ID Panel     Status: None   Collection Time: 07/23/16 10:19 AM  Result Value Ref Range Status   Specimen Description BLOOD LEFT HAND  Final   Special Requests IN PEDIATRIC BOTTLE 4CC  Final   Culture   Final    NO GROWTH 5  DAYS Performed at [facility]    Report Status 07/28/2016 FINAL  Final  Culture, blood (routine x 2)     Status: None (Preliminary result)   Collection Time: 07/30/16 12:47 PM  Result Value Ref Range Status   Specimen Description BLOOD LEFT HAND  Final   Special Requests IN PEDIATRIC BOTTLE [place]  Final   Culture   Final    NO GROWTH < 24 HOURS Performed at [facility]    Report Status PENDING  Incomplete  Culture, blood (routine x 2)     Status: None (Preliminary result)   Collection Time: 07/30/16 12:47 PM  Result Value Ref Range Status   Specimen Description BLOOD LEFT ARM  Final   Special Requests BOTTLES DRAWN AEROBIC ONLY 5CC  Final   Culture   Final    NO GROWTH < 24 HOURS Performed at [facility]    Report Status PENDING  Incomplete     Labs: Basic Metabolic Panel:  Recent Labs Lab 07/27/16 0400 07/28/16 0934 07/30/16 0010 07/31/16 0945 08/01/16 0400  NA 152* 147* 145 144 140  K 3.6 3.5 3.9 3.8 4.3  CL 105 104 105 100* 97*  CO2 42* 39* 38* 38* 37*  GLUCOSE 182* 160* 125* 83 118*  BUN 37* 25* 16 14 13   CREATININE 1.36*  1.12* 0.97 1.02* 1.00  CALCIUM 9.0 8.7* 8.6* 8.8* 8.7*   Liver Function Tests: No results for input(s): AST, ALT, ALKPHOS, BILITOT, PROT, ALBUMIN in the last 168 hours. No results for input(s): LIPASE, AMYLASE in the last 168 hours. No results for input(s): AMMONIA in the last 168 hours. CBC:  Recent Labs Lab 07/27/16 0400  WBC 6.8  HGB 10.4*  HCT 37.8  MCV 94.3  PLT 210   Cardiac Enzymes: No results for input(s): CKTOTAL, CKMB, CKMBINDEX, TROPONINI in the last 168 hours. BNP: BNP (last 3 results)  Recent Labs  12/19/15 1308 07/07/16 1650 07/19/16 2320  BNP 36.7 39.7 51.1    ProBNP (last 3 results) No results for input(s): PROBNP in the last 8760 hours.  CBG:  Recent Labs Lab 07/31/16 2049 08/01/16 0013 08/01/16 0414 08/01/16 0756 08/01/16 1223  GLUCAP 181* 164* 115* 96 215*       Signed:  Slyvester Latona MD.  [facility] 08/01/2016, 3:21 PM

## 2016-08-01 NOTE — Clinical Social Work Placement (Signed)
   CLINICAL SOCIAL WORK PLACEMENT  NOTE  Date:  08/01/2016  Patient Details  Name: Heather Zimmerman MRN: [id number] Date of Birth: November 02, 1945  Clinical Social Work is seeking post-discharge placement for this patient at the   level of care (*CSW will initial, date and re-position this form in  chart as items are completed):  Yes   Patient/family provided with [place] Work Department's list of facilities offering this level of care within the geographic area requested by the patient (or if unable, by the patient's family).  Yes   Patient/family informed of their freedom to choose among providers that offer the needed level of care, that participate in Medicare, Medicaid or managed care program needed by the patient, have an available bed and are willing to accept the patient.  Yes   Patient/family informed of [facility]'s ownership interest in [facility], as well as of the fact that they are under no obligation to receive care at these facilities.  PASRR submitted to EDS on       PASRR number received on       Existing PASRR number confirmed on 08/01/16     FL2 transmitted to all facilities in geographic area requested by pt/family on       FL2 transmitted to all facilities within larger geographic area on     [employer]   Patient informed that his/her managed care company has contracts with or will negotiate with certain facilities, including the following:        Yes   Patient/family informed of bed offers received.  Patient chooses bed at    [facility]    Physician recommends and patient chooses bed at      Patient to be transferred to   on 08/01/16.  Patient to be transferred to facility by [facility]     Patient family notified on 08/01/16 of transfer.  Name of family member notified:      Anette Guarneri   PHYSICIAN Please prepare priority discharge summary, including medications     Additional Comment:     _______________________________________________ Lia Hopping, LCSW 08/01/2016, 3:51 PM

## 2016-08-01 NOTE — Progress Notes (Signed)
Report called to Olu, Therapist, sports at [employer] and [place]. No questions at this time

## 2016-08-04 LAB — CULTURE, BLOOD (ROUTINE X 2)
CULTURE: NO GROWTH
Culture: NO GROWTH

## 2016-08-05 ENCOUNTER — Non-Acute Institutional Stay (SKILLED_NURSING_FACILITY): Payer: Medicare Other | Admitting: Adult Health

## 2016-08-05 ENCOUNTER — Encounter: Payer: Self-pay | Admitting: Adult Health

## 2016-08-05 DIAGNOSIS — J9612 Chronic respiratory failure with hypercapnia: Secondary | ICD-10-CM | POA: Diagnosis not present

## 2016-08-05 DIAGNOSIS — G4733 Obstructive sleep apnea (adult) (pediatric): Secondary | ICD-10-CM | POA: Diagnosis not present

## 2016-08-05 DIAGNOSIS — I509 Heart failure, unspecified: Secondary | ICD-10-CM

## 2016-08-05 DIAGNOSIS — L97929 Non-pressure chronic ulcer of unspecified part of left lower leg with unspecified severity: Secondary | ICD-10-CM

## 2016-08-05 DIAGNOSIS — Z794 Long term (current) use of insulin: Secondary | ICD-10-CM | POA: Diagnosis not present

## 2016-08-05 DIAGNOSIS — IMO0002 Reserved for concepts with insufficient information to code with codable children: Secondary | ICD-10-CM

## 2016-08-05 DIAGNOSIS — I83029 Varicose veins of left lower extremity with ulcer of unspecified site: Secondary | ICD-10-CM | POA: Diagnosis not present

## 2016-08-05 DIAGNOSIS — E1149 Type 2 diabetes mellitus with other diabetic neurological complication: Secondary | ICD-10-CM | POA: Diagnosis not present

## 2016-08-05 DIAGNOSIS — I130 Hypertensive heart and chronic kidney disease with heart failure and stage 1 through stage 4 chronic kidney disease, or unspecified chronic kidney disease: Secondary | ICD-10-CM | POA: Diagnosis not present

## 2016-08-05 DIAGNOSIS — I5032 Chronic diastolic (congestive) heart failure: Secondary | ICD-10-CM

## 2016-08-05 DIAGNOSIS — N183 Chronic kidney disease, stage 3 (moderate): Secondary | ICD-10-CM

## 2016-08-05 DIAGNOSIS — E1143 Type 2 diabetes mellitus with diabetic autonomic (poly)neuropathy: Secondary | ICD-10-CM

## 2016-08-05 DIAGNOSIS — E1165 Type 2 diabetes mellitus with hyperglycemia: Secondary | ICD-10-CM
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
## 2016-08-06 ENCOUNTER — Encounter: Payer: Self-pay | Admitting: Internal Medicine

## 2016-08-06 ENCOUNTER — Non-Acute Institutional Stay (SKILLED_NURSING_FACILITY): Payer: Medicare Other | Admitting: Internal Medicine

## 2016-08-06 DIAGNOSIS — J9612 Chronic respiratory failure with hypercapnia: Secondary | ICD-10-CM | POA: Diagnosis not present

## 2016-08-06 DIAGNOSIS — E1143 Type 2 diabetes mellitus with diabetic autonomic (poly)neuropathy: Secondary | ICD-10-CM | POA: Diagnosis not present

## 2016-08-06 DIAGNOSIS — I5032 Chronic diastolic (congestive) heart failure: Secondary | ICD-10-CM | POA: Diagnosis not present

## 2016-08-06 DIAGNOSIS — I83029 Varicose veins of left lower extremity with ulcer of unspecified site: Secondary | ICD-10-CM

## 2016-08-06 DIAGNOSIS — N183 Chronic kidney disease, stage 3 unspecified: Secondary | ICD-10-CM

## 2016-08-06 DIAGNOSIS — L97929 Non-pressure chronic ulcer of unspecified part of left lower leg with unspecified severity: Secondary | ICD-10-CM

## 2016-08-06 DIAGNOSIS — G4733 Obstructive sleep apnea (adult) (pediatric): Secondary | ICD-10-CM | POA: Diagnosis not present

## 2016-08-06 DIAGNOSIS — G894 Chronic pain syndrome: Secondary | ICD-10-CM

## 2016-08-06 DIAGNOSIS — I10 Essential (primary) hypertension: Secondary | ICD-10-CM | POA: Diagnosis not present

## 2016-08-06 DIAGNOSIS — Z89511 Acquired absence of right leg below knee: Secondary | ICD-10-CM | POA: Diagnosis not present

## 2016-08-06 NOTE — Progress Notes (Signed)
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 prompts

## 2016-08-08 LAB — BASIC METABOLIC PANEL
BUN: 36 mg/dL — AB (ref 4–21)
Creatinine: 1.5 mg/dL — AB (ref 0.5–1.1)
Glucose: 132 mg/dL
Potassium: 4.1 mmol/L (ref 3.4–5.3)
SODIUM: 145 mmol/L (ref 137–147)

## 2016-08-23 DIAGNOSIS — F329 Major depressive disorder, single episode, unspecified: Secondary | ICD-10-CM | POA: Diagnosis not present

## 2016-09-02 ENCOUNTER — Non-Acute Institutional Stay (SKILLED_NURSING_FACILITY): Payer: Medicare Other | Admitting: Adult Health

## 2016-09-02 ENCOUNTER — Encounter: Payer: Self-pay | Admitting: Adult Health

## 2016-09-02 DIAGNOSIS — J9612 Chronic respiratory failure with hypercapnia: Secondary | ICD-10-CM | POA: Diagnosis not present

## 2016-09-02 DIAGNOSIS — Z794 Long term (current) use of insulin: Secondary | ICD-10-CM | POA: Diagnosis not present

## 2016-09-02 DIAGNOSIS — F329 Major depressive disorder, single episode, unspecified: Secondary | ICD-10-CM

## 2016-09-02 DIAGNOSIS — I872 Venous insufficiency (chronic) (peripheral): Secondary | ICD-10-CM | POA: Diagnosis not present

## 2016-09-02 DIAGNOSIS — I5032 Chronic diastolic (congestive) heart failure: Secondary | ICD-10-CM

## 2016-09-02 DIAGNOSIS — N183 Chronic kidney disease, stage 3 unspecified: Secondary | ICD-10-CM

## 2016-09-02 DIAGNOSIS — E1143 Type 2 diabetes mellitus with diabetic autonomic (poly)neuropathy: Secondary | ICD-10-CM | POA: Diagnosis not present

## 2016-09-02 DIAGNOSIS — E1165 Type 2 diabetes mellitus with hyperglycemia: Secondary | ICD-10-CM

## 2016-09-02 DIAGNOSIS — F32A Depression, unspecified: Secondary | ICD-10-CM

## 2016-09-02 DIAGNOSIS — G4733 Obstructive sleep apnea (adult) (pediatric): Secondary | ICD-10-CM

## 2016-09-02 DIAGNOSIS — I130 Hypertensive heart and chronic kidney disease with heart failure and stage 1 through stage 4 chronic kidney disease, or unspecified chronic kidney disease: Secondary | ICD-10-CM

## 2016-09-02 DIAGNOSIS — I509 Heart failure, unspecified: Secondary | ICD-10-CM | POA: Diagnosis not present

## 2016-09-02 DIAGNOSIS — E1149 Type 2 diabetes mellitus with other diabetic neurological complication: Secondary | ICD-10-CM | POA: Diagnosis not present

## 2016-09-02 DIAGNOSIS — IMO0002 Reserved for concepts with insufficient information to code with codable children: Secondary | ICD-10-CM

## 2016-09-02 LAB — MAGNESIUM: Magnesium: 2.1
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
## 2016-09-12 ENCOUNTER — Encounter: Payer: Self-pay | Admitting: Adult Health

## 2016-09-12 ENCOUNTER — Non-Acute Institutional Stay (SKILLED_NURSING_FACILITY): Payer: Medicare Other | Admitting: Adult Health

## 2016-09-12 DIAGNOSIS — N183 Chronic kidney disease, stage 3 unspecified: Secondary | ICD-10-CM

## 2016-09-12 DIAGNOSIS — J9612 Chronic respiratory failure with hypercapnia: Secondary | ICD-10-CM | POA: Diagnosis not present

## 2016-09-12 DIAGNOSIS — E1140 Type 2 diabetes mellitus with diabetic neuropathy, unspecified: Secondary | ICD-10-CM | POA: Diagnosis not present

## 2016-09-12 DIAGNOSIS — Z794 Long term (current) use of insulin: Secondary | ICD-10-CM

## 2016-09-12 DIAGNOSIS — IMO0002 Reserved for concepts with insufficient information to code with codable children: Secondary | ICD-10-CM

## 2016-09-12 DIAGNOSIS — E1143 Type 2 diabetes mellitus with diabetic autonomic (poly)neuropathy: Secondary | ICD-10-CM

## 2016-09-12 DIAGNOSIS — E1165 Type 2 diabetes mellitus with hyperglycemia: Secondary | ICD-10-CM

## 2016-09-12 DIAGNOSIS — I509 Heart failure, unspecified: Secondary | ICD-10-CM | POA: Diagnosis not present

## 2016-09-12 DIAGNOSIS — I5032 Chronic diastolic (congestive) heart failure: Secondary | ICD-10-CM | POA: Diagnosis not present

## 2016-09-12 DIAGNOSIS — I130 Hypertensive heart and chronic kidney disease with heart failure and stage 1 through stage 4 chronic kidney disease, or unspecified chronic kidney disease: Secondary | ICD-10-CM
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
## 2016-09-22 LAB — BASIC METABOLIC PANEL: GLUCOSE: 201 mg/dL

## 2016-09-23 ENCOUNTER — Encounter: Payer: Self-pay | Admitting: Adult Health

## 2016-09-23 ENCOUNTER — Encounter (HOSPITAL_COMMUNITY): Payer: Self-pay | Admitting: *Deleted

## 2016-09-23 ENCOUNTER — Emergency Department (HOSPITAL_COMMUNITY): Payer: Medicare Other

## 2016-09-23 ENCOUNTER — Inpatient Hospital Stay (HOSPITAL_COMMUNITY)
Admission: EM | Admit: 2016-09-23 | Discharge: 2016-09-26 | DRG: 871 | Disposition: A | Discharge status: Skilled Nursing Facility | Payer: Medicare Other | Attending: Internal Medicine | Admitting: Internal Medicine

## 2016-09-23 ENCOUNTER — Non-Acute Institutional Stay (SKILLED_NURSING_FACILITY): Payer: Medicare Other | Admitting: Adult Health

## 2016-09-23 DIAGNOSIS — E119 Type 2 diabetes mellitus without complications: Secondary | ICD-10-CM | POA: Diagnosis not present

## 2016-09-23 DIAGNOSIS — I130 Hypertensive heart and chronic kidney disease with heart failure and stage 1 through stage 4 chronic kidney disease, or unspecified chronic kidney disease: Secondary | ICD-10-CM | POA: Diagnosis present

## 2016-09-23 DIAGNOSIS — L03116 Cellulitis of left lower limb: Secondary | ICD-10-CM

## 2016-09-23 DIAGNOSIS — L03119 Cellulitis of unspecified part of limb: Secondary | ICD-10-CM | POA: Diagnosis not present

## 2016-09-23 DIAGNOSIS — L03115 Cellulitis of right lower limb: Secondary | ICD-10-CM | POA: Diagnosis not present

## 2016-09-23 DIAGNOSIS — I878 Other specified disorders of veins: Secondary | ICD-10-CM | POA: Diagnosis present

## 2016-09-23 DIAGNOSIS — R7889 Finding of other specified substances, not normally found in blood: Secondary | ICD-10-CM | POA: Diagnosis not present

## 2016-09-23 DIAGNOSIS — F32A Depression, unspecified: Secondary | ICD-10-CM | POA: Diagnosis present

## 2016-09-23 DIAGNOSIS — N179 Acute kidney failure, unspecified: Secondary | ICD-10-CM | POA: Diagnosis present

## 2016-09-23 DIAGNOSIS — E872 Acidosis: Secondary | ICD-10-CM | POA: Diagnosis present

## 2016-09-23 DIAGNOSIS — R000 Tachycardia, unspecified: Secondary | ICD-10-CM | POA: Diagnosis present

## 2016-09-23 DIAGNOSIS — E1165 Type 2 diabetes mellitus with hyperglycemia: Secondary | ICD-10-CM | POA: Diagnosis present

## 2016-09-23 DIAGNOSIS — J9620 Acute and chronic respiratory failure, unspecified whether with hypoxia or hypercapnia: Secondary | ICD-10-CM | POA: Diagnosis not present

## 2016-09-23 DIAGNOSIS — J9611 Chronic respiratory failure with hypoxia: Secondary | ICD-10-CM | POA: Diagnosis present

## 2016-09-23 DIAGNOSIS — I872 Venous insufficiency (chronic) (peripheral): Secondary | ICD-10-CM | POA: Diagnosis present

## 2016-09-23 DIAGNOSIS — Z888 Allergy status to other drugs, medicaments and biological substances status: Secondary | ICD-10-CM | POA: Diagnosis not present

## 2016-09-23 DIAGNOSIS — N390 Urinary tract infection, site not specified: Secondary | ICD-10-CM | POA: Diagnosis present

## 2016-09-23 DIAGNOSIS — A419 Sepsis, unspecified organism: Secondary | ICD-10-CM | POA: Diagnosis not present

## 2016-09-23 DIAGNOSIS — Z8249 Family history of ischemic heart disease and other diseases of the circulatory system: Secondary | ICD-10-CM

## 2016-09-23 DIAGNOSIS — N183 Chronic kidney disease, stage 3 unspecified: Secondary | ICD-10-CM

## 2016-09-23 DIAGNOSIS — Z7951 Long term (current) use of inhaled steroids: Secondary | ICD-10-CM | POA: Diagnosis not present

## 2016-09-23 DIAGNOSIS — E1149 Type 2 diabetes mellitus with other diabetic neurological complication: Secondary | ICD-10-CM | POA: Diagnosis present

## 2016-09-23 DIAGNOSIS — K2100 Gastro-esophageal reflux disease with esophagitis, without bleeding: Secondary | ICD-10-CM | POA: Diagnosis present

## 2016-09-23 DIAGNOSIS — J9621 Acute and chronic respiratory failure with hypoxia: Secondary | ICD-10-CM | POA: Diagnosis not present

## 2016-09-23 DIAGNOSIS — I5032 Chronic diastolic (congestive) heart failure: Secondary | ICD-10-CM | POA: Diagnosis present

## 2016-09-23 DIAGNOSIS — Z7982 Long term (current) use of aspirin: Secondary | ICD-10-CM | POA: Diagnosis not present

## 2016-09-23 DIAGNOSIS — Z794 Long term (current) use of insulin: Secondary | ICD-10-CM

## 2016-09-23 DIAGNOSIS — F329 Major depressive disorder, single episode, unspecified: Secondary | ICD-10-CM | POA: Diagnosis present

## 2016-09-23 DIAGNOSIS — Z79899 Other long term (current) drug therapy: Secondary | ICD-10-CM

## 2016-09-23 DIAGNOSIS — IMO0002 Reserved for concepts with insufficient information to code with codable children: Secondary | ICD-10-CM | POA: Diagnosis present

## 2016-09-23 DIAGNOSIS — J189 Pneumonia, unspecified organism: Secondary | ICD-10-CM

## 2016-09-23 DIAGNOSIS — G4733 Obstructive sleep apnea (adult) (pediatric): Secondary | ICD-10-CM | POA: Diagnosis present

## 2016-09-23 DIAGNOSIS — K219 Gastro-esophageal reflux disease without esophagitis: Secondary | ICD-10-CM | POA: Diagnosis present

## 2016-09-23 DIAGNOSIS — M6281 Muscle weakness (generalized): Secondary | ICD-10-CM | POA: Diagnosis not present

## 2016-09-23 DIAGNOSIS — R0902 Hypoxemia: Secondary | ICD-10-CM | POA: Diagnosis not present

## 2016-09-23 DIAGNOSIS — E1122 Type 2 diabetes mellitus with diabetic chronic kidney disease: Secondary | ICD-10-CM | POA: Diagnosis present

## 2016-09-23 DIAGNOSIS — I509 Heart failure, unspecified: Secondary | ICD-10-CM | POA: Diagnosis not present

## 2016-09-23 DIAGNOSIS — G92 Toxic encephalopathy: Secondary | ICD-10-CM | POA: Diagnosis present

## 2016-09-23 DIAGNOSIS — M79606 Pain in leg, unspecified: Secondary | ICD-10-CM | POA: Diagnosis not present

## 2016-09-23 DIAGNOSIS — G8929 Other chronic pain: Secondary | ICD-10-CM | POA: Diagnosis present

## 2016-09-23 DIAGNOSIS — J9602 Acute respiratory failure with hypercapnia: Secondary | ICD-10-CM | POA: Diagnosis not present

## 2016-09-23 DIAGNOSIS — E860 Dehydration: Secondary | ICD-10-CM | POA: Diagnosis present

## 2016-09-23 DIAGNOSIS — D631 Anemia in chronic kidney disease: Secondary | ICD-10-CM | POA: Diagnosis not present

## 2016-09-23 DIAGNOSIS — Z89511 Acquired absence of right leg below knee: Secondary | ICD-10-CM

## 2016-09-23 DIAGNOSIS — I87312 Chronic venous hypertension (idiopathic) with ulcer of left lower extremity: Secondary | ICD-10-CM | POA: Diagnosis not present

## 2016-09-23 DIAGNOSIS — J811 Chronic pulmonary edema: Secondary | ICD-10-CM | POA: Diagnosis not present

## 2016-09-23 DIAGNOSIS — I7389 Other specified peripheral vascular diseases: Secondary | ICD-10-CM | POA: Diagnosis not present

## 2016-09-23 HISTORY — DX: Chronic kidney disease, stage 3 (moderate): N18.3

## 2016-09-23 HISTORY — DX: Chronic kidney disease, stage 3 unspecified: N18.30

## 2016-09-23 LAB — URINALYSIS, ROUTINE W REFLEX MICROSCOPIC
BILIRUBIN URINE: NEGATIVE
GLUCOSE, UA: NEGATIVE mg/dL
Ketones, ur: 5 mg/dL — AB
NITRITE: NEGATIVE
PH: 5 (ref 5.0–8.0)
Protein, ur: 100 mg/dL — AB
SPECIFIC GRAVITY, URINE: 1.016 (ref 1.005–1.030)
Squamous Epithelial / LPF: NONE SEEN

## 2016-09-23 LAB — COMPREHENSIVE METABOLIC PANEL
ALBUMIN: 2.8 g/dL — AB (ref 3.5–5.0)
ALT: 24 U/L (ref 14–54)
ANION GAP: 9 (ref 5–15)
AST: 27 U/L (ref 15–41)
Alkaline Phosphatase: 116 U/L (ref 38–126)
BILIRUBIN TOTAL: 0.7 mg/dL (ref 0.3–1.2)
BUN: 48 mg/dL — AB (ref 6–20)
CHLORIDE: 98 mmol/L — AB (ref 101–111)
CO2: 33 mmol/L — ABNORMAL HIGH (ref 22–32)
Calcium: 8.8 mg/dL — ABNORMAL LOW (ref 8.9–10.3)
Creatinine, Ser: 1.88 mg/dL — ABNORMAL HIGH (ref 0.44–1.00)
GFR calc Af Amer: 30 mL/min — ABNORMAL LOW (ref >60)
GFR calc non Af Amer: 26 mL/min — ABNORMAL LOW (ref >60)
GLUCOSE: 207 mg/dL — AB (ref 65–99)
POTASSIUM: 4 mmol/L (ref 3.5–5.1)
Sodium: 140 mmol/L (ref 135–145)
TOTAL PROTEIN: 7.8 g/dL (ref 6.5–8.1)

## 2016-09-23 LAB — CBC WITH DIFFERENTIAL/PLATELET
BASOS ABS: 0 10*3/uL (ref 0.0–0.1)
Basophils Relative: 0 %
EOS PCT: 0 %
Eosinophils Absolute: 0.1 10*3/uL (ref 0.0–0.7)
HCT: 40.4 % (ref 36.0–46.0)
HEMOGLOBIN: 12.2 g/dL (ref 12.0–15.0)
LYMPHS ABS: 1.7 10*3/uL (ref 0.7–4.0)
LYMPHS PCT: 8 %
MCH: 27.4 pg (ref 26.0–34.0)
MCHC: 30.2 g/dL (ref 30.0–36.0)
MCV: 90.8 fL (ref 78.0–100.0)
Monocytes Absolute: 0.2 10*3/uL (ref 0.1–1.0)
Monocytes Relative: 1 %
NEUTROS PCT: 91 %
Neutro Abs: 19.9 10*3/uL — ABNORMAL HIGH (ref 1.7–7.7)
Platelets: 161 10*3/uL (ref 150–400)
RBC: 4.45 MIL/uL (ref 3.87–5.11)
RDW: 18.9 % — ABNORMAL HIGH (ref 11.5–15.5)
WBC: 22 10*3/uL — ABNORMAL HIGH (ref 4.0–10.5)

## 2016-09-23 LAB — I-STAT CG4 LACTIC ACID, ED: LACTIC ACID, VENOUS: 1.82 mmol/L (ref 0.5–1.9)

## 2016-09-23 LAB — I-STAT BETA HCG BLOOD, ED (MC, WL, AP ONLY): HCG, QUANTITATIVE: 22.5 m[IU]/mL — AB (ref <5)

## 2016-09-23 LAB — PROTIME-INR
INR: 1.1
PROTHROMBIN TIME: 14.3 s (ref 11.4–15.2)

## 2016-09-23 LAB — CBG MONITORING, ED: Glucose-Capillary: 169 mg/dL — ABNORMAL HIGH (ref 65–99)

## 2016-09-23 MED ORDER — VITAMIN C 500 MG PO TABS
500.0000 mg | ORAL_TABLET | Freq: Two times a day (BID) | ORAL | Status: DC
  Administered 2016-09-24 – 2016-09-26 (×6): 500 mg via ORAL
  Filled 2016-09-23 (×6): qty 1

## 2016-09-23 MED ORDER — ASPIRIN EC 81 MG PO TBEC
81.0000 mg | DELAYED_RELEASE_TABLET | Freq: Every day | ORAL | Status: DC
  Administered 2016-09-24 – 2016-09-26 (×3): 81 mg via ORAL
  Filled 2016-09-23 (×3): qty 1

## 2016-09-23 MED ORDER — CAMPHOR-MENTHOL 0.5-0.5 % EX LOTN
1.0000 "application " | TOPICAL_LOTION | Freq: Three times a day (TID) | CUTANEOUS | Status: DC | PRN
  Filled 2016-09-23: qty 222

## 2016-09-23 MED ORDER — INSULIN GLARGINE 100 UNIT/ML ~~LOC~~ SOLN
20.0000 [IU] | Freq: Every day | SUBCUTANEOUS | Status: DC
  Administered 2016-09-24 – 2016-09-25 (×3): 20 [IU] via SUBCUTANEOUS
  Filled 2016-09-23 (×4): qty 0.2

## 2016-09-23 MED ORDER — MOMETASONE FURO-FORMOTEROL FUM 200-5 MCG/ACT IN AERO
2.0000 | INHALATION_SPRAY | Freq: Two times a day (BID) | RESPIRATORY_TRACT | Status: DC
  Administered 2016-09-24 – 2016-09-25 (×3): 2 via RESPIRATORY_TRACT
  Filled 2016-09-23: qty 8.8

## 2016-09-23 MED ORDER — SODIUM CHLORIDE 0.9 % IV BOLUS (SEPSIS): 1000.0000 mL | Freq: Once | INTRAVENOUS | Status: DC

## 2016-09-23 MED ORDER — SODIUM CHLORIDE 0.9 % IV BOLUS (SEPSIS): 500.0000 mL | Freq: Once | INTRAVENOUS | Status: DC

## 2016-09-23 MED ORDER — VANCOMYCIN HCL 10 G IV SOLR
1250.0000 mg | INTRAVENOUS | Status: DC
  Administered 2016-09-24: 1250 mg via INTRAVENOUS
  Filled 2016-09-23 (×3): qty 1250

## 2016-09-23 MED ORDER — VITAMIN D3 25 MCG (1000 UNIT) PO TABS
2000.0000 [IU] | ORAL_TABLET | Freq: Every day | ORAL | Status: DC
  Administered 2016-09-24 – 2016-09-26 (×3): 2000 [IU] via ORAL
  Filled 2016-09-23 (×3): qty 2

## 2016-09-23 MED ORDER — ZINC SULFATE 220 (50 ZN) MG PO CAPS
220.0000 mg | ORAL_CAPSULE | Freq: Every day | ORAL | Status: DC
  Administered 2016-09-24 – 2016-09-26 (×3): 220 mg via ORAL
  Filled 2016-09-23 (×4): qty 1

## 2016-09-23 MED ORDER — ACETAMINOPHEN 325 MG RE SUPP
975.0000 mg | Freq: Once | RECTAL | Status: AC
  Administered 2016-09-23: 975 mg via RECTAL

## 2016-09-23 MED ORDER — SODIUM CHLORIDE 0.9 % IV SOLN
2500.0000 mg | INTRAVENOUS | Status: AC
  Administered 2016-09-23: 2500 mg via INTRAVENOUS
  Filled 2016-09-23: qty 2000

## 2016-09-23 MED ORDER — GABAPENTIN 300 MG PO CAPS
600.0000 mg | ORAL_CAPSULE | Freq: Two times a day (BID) | ORAL | Status: DC
  Administered 2016-09-24 – 2016-09-26 (×6): 600 mg via ORAL
  Filled 2016-09-23 (×6): qty 2

## 2016-09-23 MED ORDER — ENOXAPARIN SODIUM 60 MG/0.6ML ~~LOC~~ SOLN
60.0000 mg | Freq: Every day | SUBCUTANEOUS | Status: DC
  Administered 2016-09-24 – 2016-09-25 (×3): 60 mg via SUBCUTANEOUS
  Filled 2016-09-23 (×3): qty 0.6

## 2016-09-23 MED ORDER — ACETAMINOPHEN 325 MG PO TABS
650.0000 mg | ORAL_TABLET | Freq: Four times a day (QID) | ORAL | Status: DC | PRN
  Administered 2016-09-26 (×2): 650 mg via ORAL
  Filled 2016-09-23 (×2): qty 2

## 2016-09-23 MED ORDER — PIPERACILLIN-TAZOBACTAM 3.375 G IVPB 30 MIN
3.3750 g | Freq: Once | INTRAVENOUS | Status: AC
  Administered 2016-09-23: 3.375 g via INTRAVENOUS
  Filled 2016-09-23: qty 50

## 2016-09-23 MED ORDER — ONDANSETRON HCL 4 MG/2ML IJ SOLN: 4.0000 mg | Freq: Four times a day (QID) | INTRAMUSCULAR | Status: DC | PRN

## 2016-09-23 MED ORDER — GLUCAGON HCL RDNA (DIAGNOSTIC) 1 MG IJ SOLR: 1.0000 mg | Freq: Once | INTRAMUSCULAR | Status: DC | PRN

## 2016-09-23 MED ORDER — ACETAMINOPHEN 650 MG RE SUPP
RECTAL | Status: AC
  Filled 2016-09-23: qty 2

## 2016-09-23 MED ORDER — ZOLPIDEM TARTRATE 5 MG PO TABS: 5.0000 mg | ORAL_TABLET | Freq: Every evening | ORAL | Status: DC | PRN

## 2016-09-23 MED ORDER — MAGNESIUM OXIDE 400 (241.3 MG) MG PO TABS
400.0000 mg | ORAL_TABLET | Freq: Three times a day (TID) | ORAL | Status: DC
  Administered 2016-09-24 – 2016-09-26 (×9): 400 mg via ORAL
  Filled 2016-09-23 (×9): qty 1

## 2016-09-23 MED ORDER — FLUTICASONE PROPIONATE 50 MCG/ACT NA SUSP
2.0000 | Freq: Every day | NASAL | Status: DC
  Administered 2016-09-24 – 2016-09-25 (×3): 2 via NASAL
  Filled 2016-09-23: qty 16

## 2016-09-23 MED ORDER — BIOTENE DRY MOUTH MT LIQD: 15.0000 mL | OROMUCOSAL | Status: DC | PRN

## 2016-09-23 MED ORDER — OCUSOFT EYELID CLEANSING EX PADS: MEDICATED_PAD | Freq: Every day | CUTANEOUS | Status: DC

## 2016-09-23 MED ORDER — ONDANSETRON HCL 4 MG PO TABS: 4.0000 mg | ORAL_TABLET | Freq: Four times a day (QID) | ORAL | Status: DC | PRN

## 2016-09-23 MED ORDER — DULOXETINE HCL 20 MG PO CPEP
40.0000 mg | ORAL_CAPSULE | Freq: Every day | ORAL | Status: DC
Start: 2016-09-23 — End: 2016-09-26
  Administered 2016-09-24 – 2016-09-26 (×3): 40 mg via ORAL
  Filled 2016-09-23 (×4): qty 2

## 2016-09-23 MED ORDER — VANCOMYCIN HCL IN DEXTROSE 1-5 GM/200ML-% IV SOLN: 1000.0000 mg | Freq: Once | INTRAVENOUS | Status: DC

## 2016-09-23 MED ORDER — MAGNESIUM HYDROXIDE 400 MG/5ML PO SUSP
30.0000 mL | Freq: Every day | ORAL | Status: DC | PRN
  Filled 2016-09-23: qty 30

## 2016-09-23 MED ORDER — SODIUM CHLORIDE 0.9 % IV SOLN
INTRAVENOUS | Status: DC
  Administered 2016-09-23 – 2016-09-25 (×3): via INTRAVENOUS

## 2016-09-23 MED ORDER — PIPERACILLIN-TAZOBACTAM 3.375 G IVPB
3.3750 g | Freq: Three times a day (TID) | INTRAVENOUS | Status: DC
  Administered 2016-09-24 – 2016-09-25 (×5): 3.375 g via INTRAVENOUS
  Filled 2016-09-23 (×5): qty 50

## 2016-09-23 MED ORDER — LORATADINE 10 MG PO TABS
10.0000 mg | ORAL_TABLET | Freq: Every day | ORAL | Status: DC
  Administered 2016-09-24 – 2016-09-26 (×3): 10 mg via ORAL
  Filled 2016-09-23 (×3): qty 1

## 2016-09-23 MED ORDER — SODIUM CHLORIDE 0.9 % IV BOLUS (SEPSIS)
1000.0000 mL | Freq: Once | INTRAVENOUS | Status: AC
  Administered 2016-09-23: 1000 mL via INTRAVENOUS

## 2016-09-23 MED ORDER — IPRATROPIUM-ALBUTEROL 0.5-2.5 (3) MG/3ML IN SOLN
3.0000 mL | Freq: Four times a day (QID) | RESPIRATORY_TRACT | Status: DC
  Administered 2016-09-23 – 2016-09-24 (×4): 3 mL via RESPIRATORY_TRACT
  Filled 2016-09-23 (×5): qty 3

## 2016-09-23 MED ORDER — INSULIN ASPART 100 UNIT/ML ~~LOC~~ SOLN
0.0000 [IU] | Freq: Three times a day (TID) | SUBCUTANEOUS | Status: DC
  Administered 2016-09-24: 1 [IU] via SUBCUTANEOUS
  Administered 2016-09-24 – 2016-09-25 (×3): 3 [IU] via SUBCUTANEOUS
  Administered 2016-09-25: 1 [IU] via SUBCUTANEOUS
  Administered 2016-09-25: 2 [IU] via SUBCUTANEOUS
  Administered 2016-09-26 (×2): 3 [IU] via SUBCUTANEOUS
  Administered 2016-09-26: 2 [IU] via SUBCUTANEOUS

## 2016-09-23 MED ORDER — IPRATROPIUM-ALBUTEROL 0.5-2.5 (3) MG/3ML IN SOLN
RESPIRATORY_TRACT | Status: AC
  Filled 2016-09-23: qty 3

## 2016-09-23 NOTE — Progress Notes (Signed)
Pt refuse NIV for the night. RN at bedside.

## 2016-09-23 NOTE — ED Notes (Signed)
Called floor timer started

## 2016-09-23 NOTE — Progress Notes (Signed)
Pharmacy Antibiotic Note  Heather Zimmerman. Heather Zimmerman is a 70 y.o. female admitted from SNF on 09/23/2016 with sepsis.  Pharmacy has been consulted for Vancomycin and Zosyn dosing.  Plan: Vancomycin 2500mg  IV x 1, then 1250mg  IV q24h.  Vancomycin trough level at steady state. Goal trough level 15-20 mcg/mL.  Zosyn 3.375g IV x 1 over 30 minutes, then Zosyn 3.375g IV q8h (infuse over 4 hours). Monitor renal function, cultures, clinical course.   Weight: 297 lb (134.7 kg)  Temp (24hrs), Avg:101.4 F (38.6 C), Min:99.7 F (37.6 C), Max:102.4 F (39.1 C)   Recent Labs Lab 09/23/16 1836 09/23/16 1851  WBC 22.0*  --   CREATININE 1.88*  --   LATICACIDVEN  --  1.82    Estimated Creatinine Clearance: 33.4 mL/min (by C-G formula based on SCr of 1.88 mg/dL (H)).    Allergies  Allergen Reactions  . Ace Inhibitors     unknown    Antimicrobials this admission: 12/11 >> Vancomycin >> 12/11 >> Zosyn >>  Dose adjustments this admission: --  Microbiology results: PTA 12/11 UCx (from SNF): 12/11 BCx: sent 12/11 UCx: sent   Thank you for allowing pharmacy to be a part of this patient's care.   Lindell Spar, PharmD, BCPS Pager: [phone] 09/23/2016 7:22 PM

## 2016-09-23 NOTE — ED Notes (Signed)
Timer restarted 10:29
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
## 2016-09-23 NOTE — Progress Notes (Signed)
Lovenox per Pharmacy for DVT Prophylaxis    Pharmacy has been consulted from dosing enoxaparin (lovenox) in this patient for DVT prophylaxis.  The pharmacist has reviewed pertinent labs (Hgb _12.2__; PLT__161_), patient weight (_134.7__kg) and renal function (CrCl_35__mL/min) and decided that enoxaparin _60_mg SQ Q_24_Hrs is appropriate for this patient.  The pharmacy department will sign off at this time.  Please reconsult pharmacy if status changes or for further issues.  Thank you  Cyndia Diver PharmD, BCPS  09/23/2016, 11:23 PM

## 2016-09-23 NOTE — ED Notes (Signed)
Pt had UA today already and results were included from the nursing home.  Culture is pending.  Paper with results (nitrate positive,3+bacteria are at the bedside)

## 2016-09-23 NOTE — ED Notes (Signed)
Bed: [id number] Expected date:  Expected time:  Means of arrival:  Comments: EMS- 71yo F, abnormal labs
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
## 2016-09-23 NOTE — ED Triage Notes (Signed)
Pt is here from SNF ([facility]).  Pt was sent here due to Fever and elevated white count, concern for sepsis.  Pt has cellulitis on right leg.  Pt is on 3L [place] all the time.  Pt is alert and oriented.  Pt is morbidly obese with hx of right BKA

## 2016-09-24 LAB — CBC
HCT: 39.2 % (ref 36.0–46.0)
HEMOGLOBIN: 11.9 g/dL — AB (ref 12.0–15.0)
MCH: 26.7 pg (ref 26.0–34.0)
MCHC: 30.4 g/dL (ref 30.0–36.0)
MCV: 88.1 fL (ref 78.0–100.0)
PLATELETS: 162 10*3/uL (ref 150–400)
RBC: 4.45 MIL/uL (ref 3.87–5.11)
RDW: 18.7 % — ABNORMAL HIGH (ref 11.5–15.5)
WBC: 19.2 10*3/uL — ABNORMAL HIGH (ref 4.0–10.5)

## 2016-09-24 LAB — GLUCOSE, CAPILLARY
GLUCOSE-CAPILLARY: 204 mg/dL — AB (ref 65–99)
Glucose-Capillary: 147 mg/dL — ABNORMAL HIGH (ref 65–99)
Glucose-Capillary: 241 mg/dL — ABNORMAL HIGH (ref 65–99)
Glucose-Capillary: 210 mg/dL — ABNORMAL HIGH (ref 65–99)

## 2016-09-24 LAB — BASIC METABOLIC PANEL
ANION GAP: 8 (ref 5–15)
BUN: 50 mg/dL — ABNORMAL HIGH (ref 6–20)
CO2: 35 mmol/L — AB (ref 22–32)
Calcium: 8.4 mg/dL — ABNORMAL LOW (ref 8.9–10.3)
Chloride: 100 mmol/L — ABNORMAL LOW (ref 101–111)
Creatinine, Ser: 1.82 mg/dL — ABNORMAL HIGH (ref 0.44–1.00)
GFR calc Af Amer: 31 mL/min — ABNORMAL LOW (ref >60)
GFR, EST NON AFRICAN AMERICAN: 27 mL/min — AB (ref >60)
GLUCOSE: 184 mg/dL — AB (ref 65–99)
POTASSIUM: 4.2 mmol/L (ref 3.5–5.1)
Sodium: 143 mmol/L (ref 135–145)

## 2016-09-24 LAB — HIV ANTIBODY (ROUTINE TESTING W REFLEX): HIV Screen 4th Generation wRfx: NONREACTIVE

## 2016-09-24 LAB — MRSA PCR SCREENING: MRSA BY PCR: NEGATIVE

## 2016-09-24 LAB — LACTIC ACID, PLASMA
Lactic Acid, Venous: 1.9 mmol/L (ref 0.5–1.9)
Lactic Acid, Venous: 2 mmol/L (ref 0.5–1.9)

## 2016-09-24 LAB — SEDIMENTATION RATE: SED RATE: 92 mm/h — AB (ref 0–22)

## 2016-09-24 LAB — BRAIN NATRIURETIC PEPTIDE: B Natriuretic Peptide: 124.6 pg/mL — ABNORMAL HIGH (ref 0.0–100.0)

## 2016-09-24 LAB — C-REACTIVE PROTEIN: CRP: 39 mg/dL — AB (ref <1.0)

## 2016-09-24 LAB — CREATININE, URINE, RANDOM: CREATININE, URINE: 116.72 mg/dL

## 2016-09-24 LAB — PROCALCITONIN: Procalcitonin: 12.33 ng/mL

## 2016-09-24 MED ORDER — VITAMINS A & D EX OINT
TOPICAL_OINTMENT | CUTANEOUS | Status: AC
  Administered 2016-09-24: 02:00:00
  Filled 2016-09-24: qty 5

## 2016-09-24 MED ORDER — SODIUM CHLORIDE 0.9 % IV BOLUS (SEPSIS)
1500.0000 mL | Freq: Once | INTRAVENOUS | Status: AC
  Administered 2016-09-24: 1500 mL via INTRAVENOUS

## 2016-09-24 MED ORDER — SODIUM CHLORIDE 0.9 % IV BOLUS (SEPSIS)
1000.0000 mL | Freq: Once | INTRAVENOUS | Status: AC
  Administered 2016-09-24: 1000 mL via INTRAVENOUS

## 2016-09-24 MED ORDER — ORAL CARE MOUTH RINSE
15.0000 mL | Freq: Two times a day (BID) | OROMUCOSAL | Status: DC
  Administered 2016-09-24 – 2016-09-26 (×6): 15 mL via OROMUCOSAL

## 2016-09-24 MED ORDER — SODIUM CHLORIDE 0.9 % IV BOLUS (SEPSIS): 500.0000 mL | Freq: Once | INTRAVENOUS | Status: DC

## 2016-09-24 NOTE — Clinical Social Work Note (Signed)
Clinical Social Work Assessment  Patient Details  Name: Heather Zimmerman. [place] MRN: [id number] Date of Birth: Jul 13, 1946  Date of referral:  09/24/16               Reason for consult:  Facility Placement                Permission sought to share information with:  Facility Art therapist granted to share information::  Yes, Verbal Permission Granted  Name::        Agency::     Relationship::     Contact Information:     Housing/Transportation Living arrangements for the past 2 months:  [place] of Information:  Patient Patient Interpreter Needed:  None Criminal Activity/Legal Involvement Pertinent to Current Situation/Hospitalization:  No - Comment as needed Significant Relationships:  Adult Children Lives with:  Facility Resident Do you feel safe going back to the place where you live?  Yes Need for family participation in patient care:  No (Coment)  Care giving concerns:  CSW received consult that patient was admitted from [facility].    Social Worker assessment / plan:  CSW confirmed with patient that she plans to return to [facility] SNF at discharge. CSW also confirmed with Tammy at [facility] that they would be able to take patient back at discharge.   Employment status:  Retired Forensic scientist:  Information systems manager, Medicaid In [place] PT Recommendations:  Not assessed at this time [place] / Referral to community resources:  [place]  Patient/Family's Response to care:  Patient states that she's been a resident of [facility] since March 2015 & has been happy with the care she's received while there.   Patient/Family's Understanding of and Emotional Response to Diagnosis, Current Treatment, and Prognosis:    Emotional Assessment Appearance:    Attitude/Demeanor/Rapport:    Affect (typically observed):    Orientation:  Oriented to Self, Oriented to Place, Oriented to  Time, Oriented to Situation Alcohol /  Substance use:    Psych involvement (Current and /or in the community):     Discharge Needs  Concerns to be addressed:    Readmission within the last 30 days:    Current discharge risk:    Barriers to Discharge:      Standley Brooking, LCSW 09/24/2016, 12:45 PM

## 2016-09-24 NOTE — Progress Notes (Signed)
CRITICAL VALUE ALERT  Critical value received: Lactic acid 2.0  Date of notification:  09/24/16  Time of notification:  0052  Critical value read back:Yes.    Nurse who received alert:  Thana Farr  MD notified (1st page):  K. Schoor  Time of first page:  [phone]  MD notified (2nd page):  Time of second page:  Responding MD:    Time MD responded:

## 2016-09-24 NOTE — NC FL2 (Signed)
[place] LEVEL OF CARE SCREENING TOOL     IDENTIFICATION  Patient Name: Heather Zimmerman Birthdate: 12/04/1945 Sex: female Admission Date (Current Location): 09/23/2016  [place] and [place] Number:  Heather Zimmerman [id number] [place] and Address:  [facility],  [place], [place]      Provider Number: [id number]  Attending Physician Name and Address:  Charlynne Cousins, MD  Relative Name and Phone Number:       Current Level of Care: Hospital Recommended Level of Care: [place] Prior Approval Number:    Date Approved/Denied:   PASRR Number: [id number]  Discharge Plan: SNF    Current Diagnoses: Patient Active Problem List   Diagnosis Date Noted  . Cellulitis of left thigh 09/23/2016  . UTI (urinary tract infection) 09/23/2016  . Hypoglycemia   . Acute on chronic diastolic congestive heart failure ([place])   . Oropharyngeal dysphagia   . Bacteremia   . Coag negative Staphylococcus bacteremia   . Chronic venous stasis dermatitis   . Respiratory failure ([place]) 07/20/2016  . Acute respiratory failure with hypercapnia ([place]) 07/20/2016  . Weakness of right upper extremity 07/09/2016  . Type II diabetes mellitus with neurological manifestations, uncontrolled ([place]) 04/23/2016  . Ventral hernia without obstruction or gangrene 02/24/2016  . Chronic respiratory failure with hypercapnia ([place]) 02/24/2016  . Essential hypertension, benign 02/24/2016  . Occult blood in stools 12/26/2015  . Hypoxemia   . Candidiasis of perineum 10/26/2015  . Hypertensive heart disease with congestive heart failure and stage 3 kidney disease ([place]) 07/24/2015  . CKD (chronic kidney disease) stage 3, GFR 30-59 ml/min 07/21/2015  . Cellulitis of leg, left 07/19/2015  . Open wound of left lower extremity 07/18/2015  . Sepsis ([place]) 07/18/2015  . Urinary retention 07/18/2015  . Venous ulcer of left leg ([place]) 12/27/2014  . Hx of right BKA ([place])  11/22/2014  . Peripheral autonomic neuropathy due to diabetes mellitus ([place]) 10/09/2014  . Obstructive sleep apnea 07/05/2014  . Acute on chronic respiratory failure with hypercapnia ([place]) 06/21/2014  . Morbid obesity ([place]) 08/30/2013  . Unspecified vitamin D deficiency 12/31/2012  . Disorder of magnesium metabolism 12/31/2012  . Depression 12/31/2012  . Chronic pain 12/31/2012  . Chronic diastolic heart failure ([place]) 12/31/2012  . GERD 12/31/2012    Orientation RESPIRATION BLADDER Height & Weight     Self, Time, Situation, Place  Normal Continent Weight: 271 lb (122.9 kg) Height:  5\' 1"  (154.9 cm)  BEHAVIORAL SYMPTOMS/MOOD NEUROLOGICAL BOWEL NUTRITION STATUS      Continent Diet (Heart Healthy/Carb Modified)  AMBULATORY STATUS COMMUNICATION OF NEEDS Skin   Extensive Assist Verbally Surgical wounds (Wound / Incision (Open or Dehisced) 10/03/15 Other (Comment) Leg Left;Lower;Circumferential)                       Personal Care Assistance Level of Assistance  Bathing, Dressing Bathing Assistance: Maximum assistance   Dressing Assistance: Maximum assistance     Functional Limitations Info             SPECIAL CARE FACTORS FREQUENCY                       Contractures      Additional Factors Info  Code Status, Allergies Code Status Info: Full Allergies Info: Ace Inhibitors           Current Medications (09/24/2016):  This is the current hospital active medication list Current Facility-Administered Medications  Medication Dose Route Frequency Provider Last Rate Last Dose  . 0.9 %  sodium chloride infusion   Intravenous Continuous Milton Ferguson, MD 75 mL/hr at 09/24/16 0040    . acetaminophen (TYLENOL) tablet 650 mg  650 mg Oral Q6H PRN Ivor Costa, MD      . antiseptic oral rinse (BIOTENE) solution 15 mL  15 mL Mouth Rinse PRN Ivor Costa, MD      . aspirin EC tablet 81 mg  81 mg Oral Daily Ivor Costa, MD   81 mg at 09/24/16 1121  . camphor-menthol (SARNA)  lotion 1 application  1 application Topical [id number] PRN Ivor Costa, MD      . cholecalciferol (VITAMIN D) tablet 2,000 Units  2,000 Units Oral Daily Ivor Costa, MD   2,000 Units at 09/24/16 1121  . DULoxetine (CYMBALTA) DR capsule 40 mg  40 mg Oral Daily Ivor Costa, MD   40 mg at 09/24/16 1122  . enoxaparin (LOVENOX) injection 60 mg  60 mg Subcutaneous QHS Ivor Costa, MD   60 mg at 09/24/16 0043  . fluticasone (FLONASE) 50 MCG/ACT nasal spray 2 spray  2 spray Each Nare QHS Ivor Costa, MD   2 spray at 09/24/16 0044  . gabapentin (NEURONTIN) capsule 600 mg  600 mg Oral BID Ivor Costa, MD   600 mg at 09/24/16 1121  . glucagon (human recombinant) (GLUCAGEN) injection 1 mg  1 mg Intravenous Once PRN Ivor Costa, MD      . insulin aspart (novoLOG) injection 0-9 Units  0-9 Units Subcutaneous TID WC Ivor Costa, MD   3 Units at 09/24/16 1132  . insulin glargine (LANTUS) injection 20 Units  20 Units Subcutaneous Q2200 Ivor Costa, MD   20 Units at 09/24/16 0043  . ipratropium-albuterol (DUONEB) 0.5-2.5 (3) MG/3ML nebulizer solution 3 mL  3 mL Nebulization Q6H Ivor Costa, MD   3 mL at 09/24/16 0222  . loratadine (CLARITIN) tablet 10 mg  10 mg Oral Daily Ivor Costa, MD   10 mg at 09/24/16 1120  . magnesium hydroxide (MILK OF MAGNESIA) suspension 30 mL  30 mL Oral Daily PRN Ivor Costa, MD      . magnesium oxide (MAG-OX) tablet 400 mg  400 mg Oral TID Ivor Costa, MD   400 mg at 09/24/16 1121  . MEDLINE mouth rinse  15 mL Mouth Rinse BID Ivor Costa, MD   15 mL at 09/24/16 1000  . mometasone-formoterol (DULERA) 200-5 MCG/ACT inhaler 2 puff  2 puff Inhalation BID Ivor Costa, MD      . ondansetron [facility]) tablet 4 mg  4 mg Oral Q6H PRN Ivor Costa, MD       Or  . ondansetron [facility]) injection 4 mg  4 mg Intravenous Q6H PRN Ivor Costa, MD      . piperacillin-tazobactam (ZOSYN) IVPB 3.375 g  3.375 g Intravenous Q8H Milton Ferguson, MD   3.375 g at 09/24/16 1121  . vancomycin (VANCOCIN) 1,250 mg in sodium chloride 0.9 % 250 mL IVPB  1,250 mg  Intravenous Q24H Milton Ferguson, MD      . vitamin C (ASCORBIC ACID) tablet 500 mg  500 mg Oral BID Ivor Costa, MD   500 mg at 09/24/16 1121  . zinc sulfate capsule 220 mg  220 mg Oral Daily Ivor Costa, MD   220 mg at 09/24/16 1122  . zolpidem (AMBIEN) tablet 5 mg  5 mg Oral QHS PRN Ivor Costa, MD         Discharge  Medications: Please see discharge summary for a list of discharge medications.  Relevant Imaging Results:  Relevant Lab Results:   Additional Information SS#: [id number]  Standley Brooking, LCSW

## 2016-09-24 NOTE — Progress Notes (Signed)
[facility] PROGRESS NOTE    Progress Note  Heather Zimmerman. [name]  [id number] DOB: 1945/11/08 DOA: 09/23/2016 PCP: Gildardo Cranker, DO     Brief Narrative:   Heather Zimmerman. [place] is an 70 y.o. female past medical history of diabetes mellitus status post right BKA, hypertension, chronic renal insufficiency stage III, chronic venous insufficiency who presents with fevers and left thigh pain  Assessment/Plan:   Sepsis due to Cellulitis of left thigh: She was started on sepsis pathway started on empiric antibiotics and fluid resuscitated her lactic acidosis resolved. I agree with IV empiric vancomycin and Zosyn, blood cultures are pending. She is slowly progressing, heart rate is improving with pressure stable. Sentences slowly improving. Blood cultures and urine cultures are pending.  Acute toxic encephalopathy: Likely due to infectious etiology, patient is still confused today, will limit sedative medications. As this can call the picture.  Acute on chronic kidney disease stage III: With a baseline creatinine of 1.0, on admission 1.8. Likely due to sepsis with ongoing diuretic use. We'll continue aggressive IV fluid hydration she does have a history of chronic diastolic heart failure so will have to readdress every 24 hours.  Chronic diastolic heart failure: 2-D echo in October 2017 that showed an EF of 55% with a grade 1 diastolic heart failure. Continue hold torsemide.  Depression: Nonsuicidal continue Cymbalta.  Diabetes mellitus type 2: Less A1c of 7.2 fairly controlled. Continue long-acting insulin plus sliding scale, she will require more insulin as her renal function improves.  Obstructive sleep apnea: Continue Cipro.  Morbid obesity ([place])  Hx of right BKA ([place])  Chronic venous stasis dermatitis Consult wound care nurse  DVT prophylaxis: lovenox Family Communication:none Disposition Plan/Barrier to D/C: unable to determine Code Status:     Code Status  Orders        Start     Ordered   09/23/16 2118  Full code  Continuous     09/23/16 2119    Code Status History    Date Active Date Inactive Code Status Order ID Comments User Context   07/20/2016  3:07 AM 08/01/2016 11:26 PM Full Code [id number]  Luz Brazen, MD ED   12/19/2015  4:34 PM 12/26/2015  2:26 AM Full Code [id number]  Theodis Blaze, MD Inpatient   12/19/2015  3:48 PM 12/19/2015  4:34 PM Full Code [id number]  Raylene Miyamoto, MD ED   10/03/2015  6:14 AM 10/10/2015  5:35 PM Full Code [id number]  Rise Patience, MD Inpatient   07/18/2015 12:23 AM 07/21/2015  7:13 PM Full Code [id number]  Waldemar Dickens, MD Inpatient   06/21/2014 12:32 AM 06/30/2014  8:26 PM Full Code [id number]  Rahul Dianna Rossetti, PA-C ED        IV Access:    Peripheral IV   Procedures and diagnostic studies:   Dg Chest 2 View  Result Date: 09/23/2016 CLINICAL DATA:  Fever and elevated white blood cell count EXAM: CHEST  2 VIEW COMPARISON:  07/30/2016 FINDINGS: Cardiac shadow is enlarged. Vascular congestion is again noted with some mild interstitial edema. No sizable effusion is seen. Increased density is noted projecting over the lower spine on the lateral film likely representing right lower lobe infiltrate. No other focal abnormality is seen. IMPRESSION: Vascular congestion and mild interstitial edema. Right lower lobe infiltrate. Electronically Signed   By: Inez Catalina M.D.   On: 09/23/2016 18:27     Medical Consultants:    None.  Anti-Infectives:   IV vancomycin and Zosyn  started on 09/23/2016  Subjective:    Heather Zimmerman. [place] patient is mildly confused and slow speech. She has no new complaints.  Objective:    Vitals:   09/23/16 2230 09/23/16 2300 09/23/16 2350 09/24/16 0538  BP:   (!) 107/42 (!) 131/57  Pulse:   99 (!) 105  Resp:   (!) 22 20  Temp:   98.7 F (37.1 C) (!) 100.4 F (38 C)  TempSrc:   Oral Oral  SpO2: 99%  97%   Weight:  122.9 kg (271 lb)    Height:         Intake/Output Summary (Last 24 hours) at 09/24/16 1320 Last data filed at 09/24/16 0200  Gross per 24 hour  Intake             1860 ml  Output                0 ml  Net             1860 ml   Filed Weights   09/23/16 1853 09/23/16 1933 09/23/16 2300  Weight: 134.7 kg (297 lb) 134.7 kg (297 lb) 122.9 kg (271 lb)    Exam: General exam: In no acute distress, Slow speech Respiratory system: Good air movement and clear to auscultation. Cardiovascular system: S1 & S2 heard, RRR. No JVD. Gastrointestinal system: Abdomen is nondistended, soft and nontender.  Central nervous system: Alert and oriented. No focal neurological deficits. Extremities: No pedal edema. Skin: No rashes, lesions or ulcers Psychiatry: Judgement and insight appears poor, she has a slow speech.   Data Reviewed:    Labs: Basic Metabolic Panel:  Recent Labs Lab 09/23/16 1836 09/24/16 0357  NA 140 143  K 4.0 4.2  CL 98* 100*  CO2 33* 35*  GLUCOSE 207* 184*  BUN 48* 50*  CREATININE 1.88* 1.82*  CALCIUM 8.8* 8.4*   GFR Estimated Creatinine Clearance: 34.8 mL/min (by C-G formula based on SCr of 1.82 mg/dL (H)). Liver Function Tests:  Recent Labs Lab 09/23/16 1836  AST 27  ALT 24  ALKPHOS 116  BILITOT 0.7  PROT 7.8  ALBUMIN 2.8*   No results for input(s): LIPASE, AMYLASE in the last 168 hours. No results for input(s): AMMONIA in the last 168 hours. Coagulation profile  Recent Labs Lab 09/23/16 1836  INR 1.10    CBC:  Recent Labs Lab 09/23/16 1836 09/24/16 0357  WBC 22.0* 19.2*  NEUTROABS 19.9*  --   HGB 12.2 11.9*  HCT 40.4 39.2  MCV 90.8 88.1  PLT 161 162   Cardiac Enzymes: No results for input(s): CKTOTAL, CKMB, CKMBINDEX, TROPONINI in the last 168 hours. BNP (last 3 results) No results for input(s): PROBNP in the last 8760 hours. CBG:  Recent Labs Lab 09/23/16 2206 09/24/16 0757 09/24/16 1131  GLUCAP 169* 147* 204*   D-Dimer: No results for input(s): DDIMER in  the last 72 hours. Hgb A1c: No results for input(s): HGBA1C in the last 72 hours. Lipid Profile: No results for input(s): CHOL, HDL, LDLCALC, TRIG, CHOLHDL, LDLDIRECT in the last 72 hours. Thyroid function studies: No results for input(s): TSH, T4TOTAL, T3FREE, THYROIDAB in the last 72 hours.  Invalid input(s): FREET3 Anemia work up: No results for input(s): VITAMINB12, FOLATE, FERRITIN, TIBC, IRON, RETICCTPCT in the last 72 hours. Sepsis Labs:  Recent Labs Lab 09/23/16 1836 09/23/16 1851 09/23/16 2354 09/24/16 0357  PROCALCITON  --   --  12.33  --   WBC 22.0*  --   --  19.2*  LATICACIDVEN  --  1.82 2.0* 1.9   Microbiology Recent Results (from the past 240 hour(s))  MRSA PCR Screening     Status: None   Collection Time: 09/24/16 12:12 AM  Result Value Ref Range Status   MRSA by PCR NEGATIVE NEGATIVE Final    Comment:        The GeneXpert MRSA Assay (FDA approved for NASAL specimens only), is one component of a comprehensive MRSA colonization surveillance program. It is not intended to diagnose MRSA infection nor to guide or monitor treatment for MRSA infections.      Medications:   . aspirin EC  81 mg Oral Daily  . cholecalciferol  2,000 Units Oral Daily  . DULoxetine  40 mg Oral Daily  . enoxaparin (LOVENOX) injection  60 mg Subcutaneous QHS  . fluticasone  2 spray Each Nare QHS  . gabapentin  600 mg Oral BID  . insulin aspart  0-9 Units Subcutaneous TID WC  . insulin glargine  20 Units Subcutaneous Q2200  . ipratropium-albuterol  3 mL Nebulization Q6H  . loratadine  10 mg Oral Daily  . magnesium oxide  400 mg Oral TID  . mouth rinse  15 mL Mouth Rinse BID  . mometasone-formoterol  2 puff Inhalation BID  . piperacillin-tazobactam (ZOSYN)  IV  3.375 g Intravenous Q8H  . vancomycin  1,250 mg Intravenous Q24H  . vitamin C  500 mg Oral BID  . zinc sulfate  220 mg Oral Daily   Continuous Infusions: . sodium chloride 75 mL/hr at 09/24/16 0040    Time  spent: 15 min   LOS: 1 day   Charlynne Cousins  [facility] Pager [phone]  *Please refer to [email or web address], password [email or web address] to get updated schedule on who will round on this patient, as hospitalists switch teams weekly. If 7PM-7AM, please contact night-coverage at [email or web address], password [email or web address] for any overnight needs.  09/24/2016, 1:20 PM

## 2016-09-24 NOTE — Consult Note (Signed)
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 Wound type: Venous insufficiency Pressure Ulcer POA: No Measurement: 4cm x 3cm area with 3cm x 2cm x 0.1cm area of tissue loss Wound [id number] red, moist Drainage (amount, consistency, odor) serosanguinous, small amount Periwound:intact with evidence of previous wound healing. Dressing procedure/placement/frequency: I will provide Nursing with Guidance via the Orders for conservative topical care using xeroform gauze for antimicrobial and astringent properties.  Foot to be placed in a pressure redistribution boot as she rotates laterally. [place] nursing team will not follow, but will remain available to this patient, the nursing and medical teams.  Please re-consult if needed. Thanks, Maudie Flakes, MSN, RN, [place], Arther Abbott  Pager# [phone]

## 2016-09-24 NOTE — Progress Notes (Signed)
When ask for readiness for NIV for the night. Pt refuse NIV for the night. Pt is stable at this time taken neb. SATs are stable no distress or complications noted.

## 2016-09-24 NOTE — Progress Notes (Signed)
I agree with the previous nurse's assessment 

## 2016-09-25 ENCOUNTER — Encounter (HOSPITAL_COMMUNITY): Payer: Self-pay

## 2016-09-25 DIAGNOSIS — E1165 Type 2 diabetes mellitus with hyperglycemia: Secondary | ICD-10-CM

## 2016-09-25 DIAGNOSIS — Z794 Long term (current) use of insulin: Secondary | ICD-10-CM

## 2016-09-25 DIAGNOSIS — F329 Major depressive disorder, single episode, unspecified: Secondary | ICD-10-CM

## 2016-09-25 DIAGNOSIS — J9611 Chronic respiratory failure with hypoxia: Secondary | ICD-10-CM

## 2016-09-25 DIAGNOSIS — Z89511 Acquired absence of right leg below knee: Secondary | ICD-10-CM

## 2016-09-25 DIAGNOSIS — L03116 Cellulitis of left lower limb: Secondary | ICD-10-CM

## 2016-09-25 DIAGNOSIS — N390 Urinary tract infection, site not specified: Secondary | ICD-10-CM

## 2016-09-25 DIAGNOSIS — E1140 Type 2 diabetes mellitus with diabetic neuropathy, unspecified: Secondary | ICD-10-CM

## 2016-09-25 DIAGNOSIS — A419 Sepsis, unspecified organism: Principal | ICD-10-CM

## 2016-09-25 DIAGNOSIS — N179 Acute kidney failure, unspecified: Secondary | ICD-10-CM

## 2016-09-25 DIAGNOSIS — N183 Chronic kidney disease, stage 3 (moderate): Secondary | ICD-10-CM

## 2016-09-25 DIAGNOSIS — I872 Venous insufficiency (chronic) (peripheral): Secondary | ICD-10-CM

## 2016-09-25 DIAGNOSIS — I5032 Chronic diastolic (congestive) heart failure: Secondary | ICD-10-CM

## 2016-09-25 LAB — GLUCOSE, CAPILLARY
GLUCOSE-CAPILLARY: 304 mg/dL — AB (ref 65–99)
Glucose-Capillary: 144 mg/dL — ABNORMAL HIGH (ref 65–99)
Glucose-Capillary: 188 mg/dL — ABNORMAL HIGH (ref 65–99)
Glucose-Capillary: 212 mg/dL — ABNORMAL HIGH (ref 65–99)

## 2016-09-25 LAB — UREA NITROGEN, URINE: UREA NITROGEN UR: 632 mg/dL

## 2016-09-25 MED ORDER — PREMIER PROTEIN SHAKE
11.0000 [oz_av] | Freq: Two times a day (BID) | ORAL | Status: DC
  Administered 2016-09-26 (×2): 11 [oz_av] via ORAL
  Filled 2016-09-25: qty 325.31

## 2016-09-25 MED ORDER — IPRATROPIUM-ALBUTEROL 0.5-2.5 (3) MG/3ML IN SOLN
3.0000 mL | Freq: Three times a day (TID) | RESPIRATORY_TRACT | Status: DC
  Administered 2016-09-25 – 2016-09-26 (×4): 3 mL via RESPIRATORY_TRACT
  Filled 2016-09-25 (×5): qty 3

## 2016-09-25 MED ORDER — DEXTROSE 5 % IV SOLN
1.0000 g | INTRAVENOUS | Status: DC
  Administered 2016-09-25: 1 g via INTRAVENOUS
  Filled 2016-09-25 (×2): qty 10

## 2016-09-25 MED ORDER — ADULT MULTIVITAMIN W/MINERALS CH
1.0000 | ORAL_TABLET | Freq: Every day | ORAL | Status: DC
  Administered 2016-09-25 – 2016-09-26 (×2): 1 via ORAL
  Filled 2016-09-25 (×2): qty 1

## 2016-09-25 NOTE — Progress Notes (Addendum)
Will follow for discharge needs.  

## 2016-09-25 NOTE — Progress Notes (Signed)
Initial Nutrition Assessment  DOCUMENTATION CODES:   Morbid obesity  INTERVENTION:  Provide Premier Protein po BID, each supplement provides 160 kcal and 30 grams of protein.   Encouraged adequate intake of calories and protein with meals to promote wound healing.  Provide multivitamin with minerals daily. Continue Vitamin C 500 mg BID and zinc sulfate 220 mg daily.  NUTRITION DIAGNOSIS:   Increased nutrient needs related to wound healing as evidenced by estimated needs.  GOAL:   Patient will meet greater than or equal to 90% of their needs  MONITOR:   PO intake, Supplement acceptance, Labs, Weight trends, I & O's, Skin  REASON FOR ASSESSMENT:   Malnutrition Screening Tool    ASSESSMENT:   70 y.o. female past medical history of diabetes mellitus status post right BKA, hypertension, chronic renal insufficiency stage III, chronic venous insufficiency who presents with fevers and left thigh pain. Patient started on sepsis pathway and found to have acute toxic encephalopathy.   Spoke with patient at bedside but she seemed to be a little confused still. She reports good appetite and that she has been eating 3 meals per day. Denied nausea/vomiting, abdominal pain, or constipation/diarrhea. Patient was unsure of UBW of if she has had any weight loss. If current weight is accurate in chart, patient has lost 26 lbs (9% body weight) over approximately 1 month which is significant for time frame.   Medications reviewed and include: Vitamin D 2000 units daily, Novolog sliding scale TID with meals, Lantus 20 units daily, magnesium oxide 400 mg TID, Vitamin C 500 mg BID, zinc sulfate 220 mg daily, NS @ 40 ml/hr.  Labs reviewed: CBG 144-241 past 24hrs. On 12/12: Chloride 100, CO2 35, Creatinine 1.82, BUN 50.  Nutrition-Focused physical exam completed. Findings are no fat depletion, no muscle depletion, and mild edema.   Diet Order:  Diet heart healthy/carb modified Room service  appropriate? Yes; Fluid consistency: Thin  Skin:  Wound (see comment) (open wound to left leg)  Last BM:  09/24/2016  Height:   Ht Readings from Last 1 Encounters:  09/23/16 5\' 1"  (1.549 m)    Weight:   Wt Readings from Last 1 Encounters:  09/23/16 271 lb (122.9 kg)    Ideal Body Weight:  45.5 kg  BMI:  Body mass index is 51.21 kg/m.  Estimated Nutritional Needs:   Kcal:  1850-2200 (MSJ x 1.1-1.3)  Protein:  100-110 grams (0.8-0.9 grams/kg)  Fluid:  >/= 1.8 L/day  EDUCATION NEEDS:   Education needs addressed  Willey Blade, MS, RD, LDN Pager: [phone] After Hours Pager: [phone]

## 2016-09-25 NOTE — Progress Notes (Addendum)
[facility] PROGRESS NOTE    Progress Note  Heather Zimmerman. Heather Zimmerman  [id number] DOB: [birth year] DOA: 09/23/2016 PCP: Gildardo Cranker, DO     Brief Narrative:   Heather Zimmerman is an 70 y.o. female past medical history of diabetes mellitus status post right BKA, hypertension, chronic renal insufficiency stage III, chronic venous insufficiency who presents with fevers and left thigh pain  Assessment/Plan:   Sepsis due to Cellulitis of left thigh and E. Coli UTI: She was started on sepsis pathway started on empiric antibiotics and fluid resuscitated her lactic acidosis has resolved now. Patient has received 3 days of IV broad spectrum abx's; now following cx's and improvement in her symptoms, will narrow abx's to rocephin. Sepsis features/cellulitis improving. Blood cultures pending Urine cx demonstrating E. Coli microorganisme  Acute toxic encephalopathy: Likely due to infectious etiology Mentation has improved and patient essentially at baseline Will monitor Encourage to wear her CPAP at bedtime   Acute on chronic kidney disease stage III: With a baseline creatinine of 1.0, on admission 1.8. Likely due to sepsis with ongoing diuretic use. Will continue now gentle hydration and follow renal function trend Holding minimizing nephrotoxic agents  Continue abx's for infection (especialy tx of her UTI)  Chronic diastolic heart failure: 2-D echo in October 2017 that showed an EF of 55% with a grade 1 diastolic heart failure. Continue to hold torsemide for another 24 hours (given AKI). Follow I's and O's  Depression: Mood stable Continue Cymbalta.  Diabetes mellitus type 2: Less A1c of 7.2 fairly controlled.  Continue long-acting insulin plus sliding scale Continue modified carb diet   Chronic resp failure with hypoxia and Obstructive sleep apnea: Continue oxygen supplementation (2L) and QHS CPAP Patient educated regarding importance of good compliance   Morbid obesity  ([place]) Body mass index is 51.21 kg/m. Low calorie diet and need for weight loss discussed with patient  Hx of right BKA ([place])  Chronic venous stasis dermatitis Consult wound care nurse Will follow rec's for care   DVT prophylaxis: lovenox Family Communication:none Disposition Plan/Barrier to D/C: will discharge back to SNF in the next 24-48 hours; will initiate narrowing abx's; follow culture results and sensitivity. Follow electrolytes and renal function.  Code Status:     Code Status Orders        Start     Ordered   09/23/16 2118  Full code  Continuous     09/23/16 2119    Code Status History    Date Active Date Inactive Code Status Order ID Comments User Context   07/20/2016  3:07 AM 08/01/2016 11:26 PM Full Code [id number]  Luz Brazen, MD ED   12/19/2015  4:34 PM 12/26/2015  2:26 AM Full Code [id number]  Theodis Blaze, MD Inpatient   12/19/2015  3:48 PM 12/19/2015  4:34 PM Full Code [id number]  Raylene Miyamoto, MD ED   10/03/2015  6:14 AM 10/10/2015  5:35 PM Full Code [id number]  Rise Patience, MD Inpatient   07/18/2015 12:23 AM 07/21/2015  7:13 PM Full Code [id number]  Waldemar Dickens, MD Inpatient   06/21/2014 12:32 AM 06/30/2014  8:26 PM Full Code [id number]  Rahul Dianna Rossetti, PA-C ED      IV Access:    Peripheral IV   Procedures and diagnostic studies:   No results found.   Medical Consultants:    None.  Anti-Infectives:   IV vancomycin and Zosyn started on 09/23/2016>>>12/13 Ceftriaxone 12/13  Subjective:   Heather Zimmerman afebrile, no  CP and no SOB. No complaints. Per RT, patient refusing CPAP at bedtime.  Objective:    Vitals:   09/25/16 0859 09/25/16 1300 09/25/16 1943 09/25/16 2129  BP:    (!) 127/56  Pulse:    89  Resp:    20  Temp:    98.3 F (36.8 C)  TempSrc:    Oral  SpO2: 91% 93% 96% 96%  Weight:      Height:        Intake/Output Summary (Last 24 hours) at 09/25/16 2149 Last data filed at 09/25/16 1800  Gross per 24 hour    Intake          2442.92 ml  Output                0 ml  Net          2442.92 ml   Filed Weights   09/23/16 1853 09/23/16 1933 09/23/16 2300  Weight: 134.7 kg (297 lb) 134.7 kg (297 lb) 122.9 kg (271 lb)    Exam: General exam: In no acute distress, no fever, no CP or SOB reported. Wearing chronic 2L [place]. Respiratory system: Good air movement and clear to auscultation. Cardiovascular system: S1 & S2 heard, RRR. No JVD. Gastrointestinal system: Abdomen is nondistended, soft and nontender.  Central nervous system: Alert and oriented. No focal neurological deficits. Extremities: right BKA, trace edema bilaterally Skin: LLE with thick, chronic non-healing venous insufficiency ulcer, mild serosanguineous drainage, no purulence. Psychiatry: stable mood, no hallucinations.   Data Reviewed:    Labs: Basic Metabolic Panel:  Recent Labs Lab 09/23/16 1836 09/24/16 0357  NA 140 143  K 4.0 4.2  CL 98* 100*  CO2 33* 35*  GLUCOSE 207* 184*  BUN 48* 50*  CREATININE 1.88* 1.82*  CALCIUM 8.8* 8.4*   GFR Estimated Creatinine Clearance: 34.8 mL/min (by C-G formula based on SCr of 1.82 mg/dL (H)).   Liver Function Tests:  Recent Labs Lab 09/23/16 1836  AST 27  ALT 24  ALKPHOS 116  BILITOT 0.7  PROT 7.8  ALBUMIN 2.8*   Coagulation profile  Recent Labs Lab 09/23/16 1836  INR 1.10    CBC:  Recent Labs Lab 09/23/16 1836 09/24/16 0357  WBC 22.0* 19.2*  NEUTROABS 19.9*  --   HGB 12.2 11.9*  HCT 40.4 39.2  MCV 90.8 88.1  PLT 161 162   CBG:  Recent Labs Lab 09/24/16 2043 09/25/16 0745 09/25/16 1146 09/25/16 1716 09/25/16 2126  GLUCAP 210* 144* 188* 212* 304*   Sepsis Labs:  Recent Labs Lab 09/23/16 1836 09/23/16 1851 09/23/16 2354 09/24/16 0357  PROCALCITON  --   --  12.33  --   WBC 22.0*  --   --  19.2*  LATICACIDVEN  --  1.82 2.0* 1.9   Microbiology Recent Results (from the past 240 hour(s))  Culture, blood (Routine x 2)     Status: None  (Preliminary result)   Collection Time: 09/23/16  6:35 PM  Result Value Ref Range Status   Specimen Description BLOOD LEFT ANTECUBITAL  Final   Special Requests IN PEDIATRIC BOTTLE 3CC  Final   Culture   Final    NO GROWTH 2 DAYS Performed at [facility]    Report Status PENDING  Incomplete  Culture, blood (Routine x 2)     Status: None (Preliminary result)   Collection Time: 09/23/16  6:36 PM  Result Value Ref Range Status   Specimen Description BLOOD RIGHT ANTECUBITAL  Final  Special Requests AEROBIC BOTTLE ONLY 8CC  Final   Culture   Final    NO GROWTH 2 DAYS Performed at [facility]    Report Status PENDING  Incomplete  Urine culture     Status: Abnormal (Preliminary result)   Collection Time: 09/23/16  8:24 PM  Result Value Ref Range Status   Specimen Description URINE, CATHETERIZED  Final   Special Requests NONE  Final   Culture >=100,000 COLONIES/mL ESCHERICHIA COLI (A)  Final   Report Status PENDING  Incomplete  MRSA PCR Screening     Status: None   Collection Time: 09/24/16 12:12 AM  Result Value Ref Range Status   MRSA by PCR NEGATIVE NEGATIVE Final    Comment:        The GeneXpert MRSA Assay (FDA approved for NASAL specimens only), is one component of a comprehensive MRSA colonization surveillance program. It is not intended to diagnose MRSA infection nor to guide or monitor treatment for MRSA infections.      Medications:   . aspirin EC  81 mg Oral Daily  . cefTRIAXone (ROCEPHIN)  IV  1 g Intravenous Q24H  . cholecalciferol  2,000 Units Oral Daily  . DULoxetine  40 mg Oral Daily  . enoxaparin (LOVENOX) injection  60 mg Subcutaneous QHS  . fluticasone  2 spray Each Nare QHS  . gabapentin  600 mg Oral BID  . insulin aspart  0-9 Units Subcutaneous TID WC  . insulin glargine  20 Units Subcutaneous Q2200  . ipratropium-albuterol  3 mL Nebulization TID  . loratadine  10 mg Oral Daily  . magnesium oxide  400 mg Oral TID  . mouth rinse   15 mL Mouth Rinse BID  . mometasone-formoterol  2 puff Inhalation BID  . multivitamin with minerals  1 tablet Oral Daily  . protein supplement shake  11 oz Oral BID BM  . vitamin C  500 mg Oral BID  . zinc sulfate  220 mg Oral Daily   Continuous Infusions: . sodium chloride 40 mL/hr at 09/25/16 0205    Time spent: 25 min   LOS: 2 days   Barton Dubois MD  [facility] Pager [phone]  *Please refer to [email or web address], password [email or web address] to get updated schedule on who will round on this patient, as hospitalists switch teams weekly. If 7PM-7AM, please contact night-coverage at [email or web address], password [email or web address] for any overnight needs.  09/25/2016, 9:49 PM

## 2016-09-25 NOTE — Progress Notes (Signed)
Patient refuses CPAP use for the night. RT will continue to monitor as needed.

## 2016-09-26 DIAGNOSIS — L03119 Cellulitis of unspecified part of limb: Secondary | ICD-10-CM | POA: Diagnosis not present

## 2016-09-26 DIAGNOSIS — I10 Essential (primary) hypertension: Secondary | ICD-10-CM | POA: Diagnosis not present

## 2016-09-26 DIAGNOSIS — G8929 Other chronic pain: Secondary | ICD-10-CM | POA: Diagnosis not present

## 2016-09-26 DIAGNOSIS — G4733 Obstructive sleep apnea (adult) (pediatric): Secondary | ICD-10-CM

## 2016-09-26 DIAGNOSIS — A419 Sepsis, unspecified organism: Secondary | ICD-10-CM | POA: Diagnosis not present

## 2016-09-26 DIAGNOSIS — I509 Heart failure, unspecified: Secondary | ICD-10-CM | POA: Diagnosis not present

## 2016-09-26 DIAGNOSIS — D631 Anemia in chronic kidney disease: Secondary | ICD-10-CM | POA: Diagnosis not present

## 2016-09-26 DIAGNOSIS — I5032 Chronic diastolic (congestive) heart failure: Secondary | ICD-10-CM | POA: Diagnosis not present

## 2016-09-26 DIAGNOSIS — N183 Chronic kidney disease, stage 3 unspecified: Secondary | ICD-10-CM

## 2016-09-26 DIAGNOSIS — L03116 Cellulitis of left lower limb: Secondary | ICD-10-CM | POA: Diagnosis not present

## 2016-09-26 DIAGNOSIS — I7389 Other specified peripheral vascular diseases: Secondary | ICD-10-CM | POA: Diagnosis not present

## 2016-09-26 DIAGNOSIS — F329 Major depressive disorder, single episode, unspecified: Secondary | ICD-10-CM | POA: Diagnosis not present

## 2016-09-26 DIAGNOSIS — I130 Hypertensive heart and chronic kidney disease with heart failure and stage 1 through stage 4 chronic kidney disease, or unspecified chronic kidney disease: Secondary | ICD-10-CM | POA: Diagnosis not present

## 2016-09-26 DIAGNOSIS — J9621 Acute and chronic respiratory failure with hypoxia: Secondary | ICD-10-CM | POA: Diagnosis not present

## 2016-09-26 DIAGNOSIS — M6281 Muscle weakness (generalized): Secondary | ICD-10-CM | POA: Diagnosis not present

## 2016-09-26 DIAGNOSIS — L03115 Cellulitis of right lower limb: Secondary | ICD-10-CM | POA: Diagnosis not present

## 2016-09-26 DIAGNOSIS — E1165 Type 2 diabetes mellitus with hyperglycemia: Secondary | ICD-10-CM | POA: Diagnosis not present

## 2016-09-26 DIAGNOSIS — I872 Venous insufficiency (chronic) (peripheral): Secondary | ICD-10-CM | POA: Diagnosis not present

## 2016-09-26 DIAGNOSIS — J9620 Acute and chronic respiratory failure, unspecified whether with hypoxia or hypercapnia: Secondary | ICD-10-CM | POA: Diagnosis not present

## 2016-09-26 DIAGNOSIS — J9611 Chronic respiratory failure with hypoxia: Secondary | ICD-10-CM | POA: Diagnosis not present

## 2016-09-26 DIAGNOSIS — E1140 Type 2 diabetes mellitus with diabetic neuropathy, unspecified: Secondary | ICD-10-CM | POA: Diagnosis not present

## 2016-09-26 DIAGNOSIS — I87312 Chronic venous hypertension (idiopathic) with ulcer of left lower extremity: Secondary | ICD-10-CM | POA: Diagnosis not present

## 2016-09-26 DIAGNOSIS — M79606 Pain in leg, unspecified: Secondary | ICD-10-CM | POA: Diagnosis not present

## 2016-09-26 DIAGNOSIS — G894 Chronic pain syndrome: Secondary | ICD-10-CM | POA: Diagnosis not present

## 2016-09-26 DIAGNOSIS — K219 Gastro-esophageal reflux disease without esophagitis: Secondary | ICD-10-CM | POA: Diagnosis not present

## 2016-09-26 DIAGNOSIS — N179 Acute kidney failure, unspecified: Secondary | ICD-10-CM

## 2016-09-26 DIAGNOSIS — Z794 Long term (current) use of insulin: Secondary | ICD-10-CM | POA: Diagnosis not present

## 2016-09-26 DIAGNOSIS — J9602 Acute respiratory failure with hypercapnia: Secondary | ICD-10-CM | POA: Diagnosis not present

## 2016-09-26 DIAGNOSIS — Z89511 Acquired absence of right leg below knee: Secondary | ICD-10-CM | POA: Diagnosis not present

## 2016-09-26 DIAGNOSIS — Z9989 Dependence on other enabling machines and devices: Secondary | ICD-10-CM

## 2016-09-26 DIAGNOSIS — E1143 Type 2 diabetes mellitus with diabetic autonomic (poly)neuropathy: Secondary | ICD-10-CM | POA: Diagnosis not present

## 2016-09-26 DIAGNOSIS — E119 Type 2 diabetes mellitus without complications: Secondary | ICD-10-CM | POA: Diagnosis not present

## 2016-09-26 DIAGNOSIS — N390 Urinary tract infection, site not specified: Secondary | ICD-10-CM | POA: Diagnosis not present

## 2016-09-26 LAB — BASIC METABOLIC PANEL
ANION GAP: 5 (ref 5–15)
BUN: 32 mg/dL — ABNORMAL HIGH (ref 6–20)
CALCIUM: 8.4 mg/dL — AB (ref 8.9–10.3)
CO2: 33 mmol/L — ABNORMAL HIGH (ref 22–32)
Chloride: 100 mmol/L — ABNORMAL LOW (ref 101–111)
Creatinine, Ser: 1.42 mg/dL — ABNORMAL HIGH (ref 0.44–1.00)
GFR, EST AFRICAN AMERICAN: 42 mL/min — AB (ref >60)
GFR, EST NON AFRICAN AMERICAN: 36 mL/min — AB (ref >60)
GLUCOSE: 224 mg/dL — AB (ref 65–99)
Potassium: 4.2 mmol/L (ref 3.5–5.1)
SODIUM: 138 mmol/L (ref 135–145)

## 2016-09-26 LAB — GLUCOSE, CAPILLARY
GLUCOSE-CAPILLARY: 234 mg/dL — AB (ref 65–99)
GLUCOSE-CAPILLARY: 198 mg/dL — AB (ref 65–99)
Glucose-Capillary: 207 mg/dL — ABNORMAL HIGH (ref 65–99)

## 2016-09-26 MED ORDER — TORSEMIDE 20 MG PO TABS: 30.0000 mg | ORAL_TABLET | Freq: Every day | ORAL | Status: DC

## 2016-09-26 MED ORDER — CEPHALEXIN 500 MG PO CAPS: 500.0000 mg | ORAL_CAPSULE | Freq: Three times a day (TID) | ORAL | Status: AC

## 2016-09-26 NOTE — Progress Notes (Signed)
Assessment unchanged, pt for d/c charge to nursing facility. SRP, RN

## 2016-09-26 NOTE — Progress Notes (Signed)
Patient is set to discharge back to [facility] today. Patient aware. Discharge packet given to RN, Sophia. [facility] called for transport.     Raynaldo Opitz, [facility] Clinical Social Worker cell #: [phone]

## 2016-09-26 NOTE — Progress Notes (Signed)
Attempt to call Nursing facility to give report, no answer to [phone], called twice. SRP, RN

## 2016-09-26 NOTE — Discharge Summary (Signed)
Physician Discharge Summary  Heather Zimmerman. [name] [id number] DOB: 03/13/1946 DOA: 09/23/2016  PCP: Heather Cranker, DO  Admit date: 09/23/2016 Discharge date: 09/26/2016  Time spent: 35 minutes  Recommendations for Outpatient Follow-up:  Keflex three times a day for 5 more days (last dose 10/01/16) Continue Wound care therapy and preventive measures for pressure ulcers  Make sure patient use her CPAP consistently  Daily weights and heart healthy diet (< 2 gram daily) Physical therapy and further nursing care as per Facility protocol  Check BMET in 1 week to follow electrolytes and renal function Repeat CBC in 1 week to follow Hgb and WBC's trend   Discharge Diagnoses:  Principal Problem:   Cellulitis of left thigh Active Problems:   Depression   Chronic diastolic heart failure (HCC)   GERD   Morbid obesity (HCC)   Hx of right BKA (HCC)   Sepsis ([place])   CKD (chronic kidney disease) stage 3, GFR 30-59 ml/min   Type II diabetes mellitus with neurological manifestations, uncontrolled (HCC)   Chronic venous stasis dermatitis   UTI (urinary tract infection)   Acute renal failure superimposed on stage 3 chronic kidney disease ([place])   Chronic respiratory failure with hypoxia ([place])   Discharge Condition: stable and improved. Discharge back to SNF for further care and rehabilitation. Follow in 10 days with PCP recommended.  Diet recommendation: heart healthy, low calorie and modified carbohydrates   Filed Weights   09/23/16 1853 09/23/16 1933 09/23/16 2300  Weight: 134.7 kg (297 lb) 134.7 kg (297 lb) 122.9 kg (271 lb)    History of present illness:  As per H&P written by Dr. Blaine Zimmerman on 09/23/16 70 y.o. female with medical history significant of diabetes mellitus, s/p of R BKA, hypertension, GERD, obesity, depression, chronic venous insufficiency, OSA on CPAP, dCHF, CKD-III, obesity, who presents with a fever, left thigh pain. Patient states that she has been having left thigh pain in  the past several days, which has been progressively getting worse. She also has fever and chills. The pain is constant, severe, non-radiating. No injury to the left thigh. Patient denies chest pain, shortness of breath, abdominal pain, diarrhea, nausea, vomiting, symptoms of UTI.  Hospital Course:  Sepsis due to Cellulitis of left thigh and E. Coli UTI: -She was started on sepsis pathway started on empiric antibiotics and fluid resuscitated her lactic acidosis has resolved now. -Patient has received 3 days of IV broad spectrum abx's and 2 days of Rocephin; now following cx's, ID recommendations and improvement in her symptoms, will narrow abx's to Keflex TID and treat for another 5 days at discharge. -Sepsis features/cellulitis improved/resolving. -Urine cx demonstrating E. Coli microorganism   Acute toxic encephalopathy: -Likely due to infectious etiology and lack of CPAP use -Mentation has improved and patient essentially at baseline -Encourage to wear her CPAP at bedtime   Acute on chronic kidney disease stage III: -With a baseline creatinine of 1.0 to 1.3; on admission was 1.8. Likely due to sepsis with ongoing diuretic use. -patient received IVF's and torsemide held for 48 hours -at discharge Cr 1.4 -will resume torsemide at adjusted dose (30mg  daily) -encourage to maintain adequate hydration -will recommend BMET in 1 week to follow electrolytes and renal function trend -Continue abx's for infection (especially tx of her UTI)  Chronic diastolic heart failure: -2-D echo in October 2017 that showed an EF of 55% with a grade 1 diastolic heart failure. -Continue home medication regimen (including torsemide at discharge) -Follow I's and O's, daily weight  and low sodium diet   Depression: -Mood stable -Continue Cymbalta.  Diabetes mellitus type 2: -LastA1c of 7.2 fairly controlled.  -will resume home hypoglycemic regimen  -Continue modified carb diet   Chronic resp failure  with hypoxia and Obstructive sleep apnea: -Continue oxygen supplementation (2L) and QHS CPAP -Patient educated regarding importance of good compliance   Morbid obesity ([place]) -Body mass index is 51.21 kg/m. -Low calorie diet and need for weight loss discussed with patient  Hx of right BKA ([place]) -stump w/o acute abnormalities   Chronic venous stasis dermatitis -wound care nurse consulted -Wound care to left lateral LE chronic, full thickness ulcer:  Cleanse withNS, pat gently dry.  Cover with xeroform gauze (folded).  Top with dry gauze 4x4s and secure with Kerlix roll gauze/tape.  Change twice daily.  Place foot into Prevalon boot. -will need reassessment on her wound and further care base on clinical response   Procedures:  See below for x-ray reports   Consultations:  WOC  ID (Dr. Linus Salmons)  Discharge Exam: Vitals:   09/26/16 1327 09/26/16 1340  BP: 138/66   Pulse: 100 90  Resp: 16 17  Temp: 99 F (37.2 C)    General exam: In no acute distress, no fever, no CP or SOB reported. Wearing chronic 2L [place]. Respiratory system: Good air movement and clear to auscultation. Cardiovascular system: S1 & S2 heard, RRR. No JVD. Gastrointestinal system: Abdomen is nondistended, soft and nontender.  Central nervous system: Alert and oriented. No focal neurological deficits. Extremities: right BKA, trace edema bilaterally Skin: LLE with thick, chronic non-healing venous insufficiency ulcer, mild serosanguineous drainage, no purulence. Psychiatry: stable mood, no hallucinations.   Discharge Instructions   Discharge Instructions    (HEART FAILURE PATIENTS) Call MD:  Anytime you have any of the following symptoms: 1) 3 pound weight gain in 24 hours or 5 pounds in 1 week 2) shortness of breath, with or without a dry hacking cough 3) swelling in the hands, feet or stomach 4) if you have to sleep on extra pillows at night in order to breathe.    Complete by:  As directed    Diet - low  sodium heart healthy    Complete by:  As directed    Discharge instructions    Complete by:  As directed    Maintain adequate hydration Keflex three times a day for 5 more days (last dose 10/01/16) Continue Wound care therapy and preventive measures for pressure ulcers  Make sure patient use her CPAP consistently  Daily weights and heart healthy diet (< 2 gram daily) Physical therapy and further nursing care as per Facility protocol  Check BMET in 1 week to follow electrolytes and renal function     Current Discharge Medication List    START taking these medications   Details  cephALEXin (KEFLEX) 500 MG capsule Take 1 capsule (500 mg total) by mouth 3 (three) times daily.      CONTINUE these medications which have CHANGED   Details  torsemide (DEMADEX) 20 MG tablet Take 1.5 tablets (30 mg total) by mouth daily.      CONTINUE these medications which have NOT CHANGED   Details  acetaminophen (TYLENOL) 650 MG CR tablet Take 650 mg by mouth every 6 (six) hours as needed for pain.    antiseptic oral rinse (BIOTENE) LIQD 15 mLs by Mouth Rinse route every hour as needed for dry mouth.     aspirin 81 MG tablet Take 81 mg by mouth  daily.    camphor-menthol (SARNA) lotion Apply 1 application topically every 8 (eight) hours as needed for itching.    cetirizine-pseudoephedrine (ZYRTEC-D) 5-120 MG tablet Take 1 tablet by mouth daily as needed for allergies ((ever 24 hours)).    cholecalciferol (VITAMIN D) 1000 UNITS tablet Take 2,000 Units by mouth daily.    DULoxetine (CYMBALTA) 20 MG capsule Take 40 mg by mouth daily.     Eyelid Cleansers (OCUSOFT EYELID CLEANSING) PADS Place 1 drop into both eyes daily.     fluticasone (FLONASE) 50 MCG/ACT nasal spray Place 2 sprays into both nostrils at bedtime.    Fluticasone-Salmeterol (ADVAIR) 250-50 MCG/DOSE AEPB Inhale 1 puff into the lungs every 12 (twelve) hours. Reported on 04/25/2016    gabapentin (NEURONTIN) 600 MG tablet Take 600 mg by  mouth 2 (two) times daily.    glucagon (GLUCAGON EMERGENCY) 1 MG injection Inject 1 mg into the vein once as needed (blood sugar).    insulin aspart (NOVOLOG FLEXPEN) 100 UNIT/ML FlexPen Inject 0-15 Units into the skin 3 (three) times daily with meals. Inject as per sliding scale: if  0-120=0units ; 121-150 = 2 units; 151-200=3 units;201-250 = 5 units; 251-300= 8 units; 301-350= 11 units; 351-400=15 units. Qty: 15 mL, Refills: 3    Insulin Glargine (LANTUS SOLOSTAR) 100 UNIT/ML Solostar Pen Inject 26 Units into the skin daily at 10 pm. Qty: 15 mL, Refills: 0    ipratropium-albuterol (DUONEB) 0.5-2.5 (3) MG/3ML SOLN Take 3 mLs by nebulization every 6 (six) hours as needed (shortness of breath). Qty: 360 mL    magnesium hydroxide (MILK OF MAGNESIA) 400 MG/5ML suspension Take 30 mLs by mouth daily as needed for mild constipation.    magnesium oxide (MAG-OX) 400 MG tablet Take 400 mg by mouth 3 (three) times daily.     potassium chloride SA (K-DUR,KLOR-CON) 20 MEQ tablet Take 20 mEq by mouth daily.    vitamin C (ASCORBIC ACID) 500 MG tablet Take 500 mg by mouth 2 (two) times daily.    Zinc Sulfate 110 MG TABS Take 220 mg by mouth daily.     PROTEIN PO Take 30 mLs by mouth 3 (three) times daily.      STOP taking these medications     cetirizine (ZYRTEC) 10 MG tablet        Allergies  Allergen Reactions  . Ace Inhibitors     unknown   Follow-up Information    Heather Cranker, DO. Schedule an appointment as soon as possible for a visit in 10 day(s).   Specialty:  Internal Medicine Contact information: [place] [phone]           The results of significant diagnostics from this hospitalization (including imaging, microbiology, ancillary and laboratory) are listed below for reference.    Significant Diagnostic Studies: Dg Chest 2 View  Result Date: 09/23/2016 CLINICAL DATA:  Fever and elevated white blood cell count EXAM: CHEST  2 VIEW  COMPARISON:  07/30/2016 FINDINGS: Cardiac shadow is enlarged. Vascular congestion is again noted with some mild interstitial edema. No sizable effusion is seen. Increased density is noted projecting over the lower spine on the lateral film likely representing right lower lobe infiltrate. No other focal abnormality is seen. IMPRESSION: Vascular congestion and mild interstitial edema. Right lower lobe infiltrate. Electronically Signed   By: Inez Catalina M.D.   On: 09/23/2016 18:27    Microbiology: Recent Results (from the past 240 hour(s))  Culture, blood (Routine x 2)  Status: None (Preliminary result)   Collection Time: 09/23/16  6:35 PM  Result Value Ref Range Status   Specimen Description BLOOD LEFT ANTECUBITAL  Final   Special Requests IN PEDIATRIC BOTTLE 3CC  Final   Culture   Final    NO GROWTH 3 DAYS Performed at [facility]    Report Status PENDING  Incomplete  Culture, blood (Routine x 2)     Status: None (Preliminary result)   Collection Time: 09/23/16  6:36 PM  Result Value Ref Range Status   Specimen Description BLOOD RIGHT ANTECUBITAL  Final   Special Requests AEROBIC BOTTLE ONLY [place]  Final   Culture   Final    NO GROWTH 3 DAYS Performed at [facility]    Report Status PENDING  Incomplete  Urine culture     Status: Abnormal (Preliminary result)   Collection Time: 09/23/16  8:24 PM  Result Value Ref Range Status   Specimen Description URINE, CATHETERIZED  Final   Special Requests NONE  Final   Culture (A)  Final    >=100,000 COLONIES/mL ESCHERICHIA COLI SUSCEPTIBILITIES TO FOLLOW Performed at [facility]    Report Status PENDING  Incomplete  MRSA PCR Screening     Status: None   Collection Time: 09/24/16 12:12 AM  Result Value Ref Range Status   MRSA by PCR NEGATIVE NEGATIVE Final    Comment:        The GeneXpert MRSA Assay (FDA approved for NASAL specimens only), is one component of a comprehensive MRSA colonization surveillance  program. It is not intended to diagnose MRSA infection nor to guide or monitor treatment for MRSA infections.      Labs: Basic Metabolic Panel:  Recent Labs Lab 09/23/16 1836 09/24/16 0357 09/26/16 0502  NA 140 143 138  K 4.0 4.2 4.2  CL 98* 100* 100*  CO2 33* 35* 33*  GLUCOSE 207* 184* 224*  BUN 48* 50* 32*  CREATININE 1.88* 1.82* 1.42*  CALCIUM 8.8* 8.4* 8.4*   Liver Function Tests:  Recent Labs Lab 09/23/16 1836  AST 27  ALT 24  ALKPHOS 116  BILITOT 0.7  PROT 7.8  ALBUMIN 2.8*   CBC:  Recent Labs Lab 09/23/16 1836 09/24/16 0357  WBC 22.0* 19.2*  NEUTROABS 19.9*  --   HGB 12.2 11.9*  HCT 40.4 39.2  MCV 90.8 88.1  PLT 161 162   BNP (last 3 results)  Recent Labs  07/07/16 1650 07/19/16 2320 09/23/16 2354  BNP 39.7 51.1 124.6*   CBG:  Recent Labs Lab 09/25/16 1146 09/25/16 1716 09/25/16 2126 09/26/16 0721 09/26/16 1202  GLUCAP 188* 212* 304* 207* 234*    Signed:  Barton Dubois MD.  [facility] 09/26/2016, 3:59 PM

## 2016-09-27 ENCOUNTER — Telehealth: Payer: Self-pay

## 2016-09-27 LAB — URINE CULTURE: Culture: >=100000 — AB

## 2016-09-27 NOTE — Telephone Encounter (Signed)
Possible re-admission to facility. This is a patient you were seeing at [facility] . [facility] F/U is needed if patient was re-admitted to facility upon discharge. Hospital discharge from [place] on 09/26/16.

## 2016-09-28 LAB — CULTURE, BLOOD (ROUTINE X 2)
CULTURE: NO GROWTH
CULTURE: NO GROWTH

## 2016-09-30 ENCOUNTER — Non-Acute Institutional Stay (SKILLED_NURSING_FACILITY): Payer: Medicare Other | Admitting: Adult Health

## 2016-09-30 ENCOUNTER — Encounter: Payer: Self-pay | Admitting: Adult Health

## 2016-09-30 DIAGNOSIS — E1143 Type 2 diabetes mellitus with diabetic autonomic (poly)neuropathy: Secondary | ICD-10-CM

## 2016-09-30 DIAGNOSIS — I509 Heart failure, unspecified: Secondary | ICD-10-CM

## 2016-09-30 DIAGNOSIS — IMO0002 Reserved for concepts with insufficient information to code with codable children: Secondary | ICD-10-CM

## 2016-09-30 DIAGNOSIS — E1140 Type 2 diabetes mellitus with diabetic neuropathy, unspecified: Secondary | ICD-10-CM | POA: Diagnosis not present

## 2016-09-30 DIAGNOSIS — L03116 Cellulitis of left lower limb: Secondary | ICD-10-CM | POA: Diagnosis not present

## 2016-09-30 DIAGNOSIS — I5032 Chronic diastolic (congestive) heart failure: Secondary | ICD-10-CM | POA: Diagnosis not present

## 2016-09-30 DIAGNOSIS — I130 Hypertensive heart and chronic kidney disease with heart failure and stage 1 through stage 4 chronic kidney disease, or unspecified chronic kidney disease: Secondary | ICD-10-CM | POA: Diagnosis not present

## 2016-09-30 DIAGNOSIS — J9611 Chronic respiratory failure with hypoxia: Secondary | ICD-10-CM

## 2016-09-30 DIAGNOSIS — N183 Hypertensive heart and chronic kidney disease with heart failure and stage 1 through stage 4 chronic kidney disease, or unspecified chronic kidney disease: Secondary | ICD-10-CM

## 2016-09-30 DIAGNOSIS — Z794 Long term (current) use of insulin: Secondary | ICD-10-CM | POA: Diagnosis not present

## 2016-09-30 DIAGNOSIS — E1165 Type 2 diabetes mellitus with hyperglycemia: Secondary | ICD-10-CM
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
## 2016-10-01 LAB — CBC AND DIFFERENTIAL
HEMATOCRIT: 37 % (ref 36–46)
Hemoglobin: 11.1 g/dL — AB (ref 12.0–16.0)
Platelets: 321 10*3/uL (ref 150–399)
WBC: 9.5 10^3/mL

## 2016-10-01 LAB — BASIC METABOLIC PANEL
BUN: 19 mg/dL (ref 4–21)
Creatinine: 1.2 mg/dL — AB (ref 0.5–1.1)
Glucose: 109 mg/dL
POTASSIUM: 4.2 mmol/L (ref 3.4–5.3)
SODIUM: 144 mmol/L (ref 137–147)

## 2016-10-17 ENCOUNTER — Non-Acute Institutional Stay (SKILLED_NURSING_FACILITY): Payer: Medicare Other | Admitting: Internal Medicine

## 2016-10-17 ENCOUNTER — Encounter: Payer: Self-pay | Admitting: Internal Medicine

## 2016-10-17 DIAGNOSIS — Z89511 Acquired absence of right leg below knee: Secondary | ICD-10-CM

## 2016-10-17 DIAGNOSIS — J9611 Chronic respiratory failure with hypoxia: Secondary | ICD-10-CM | POA: Diagnosis not present

## 2016-10-17 DIAGNOSIS — E1143 Type 2 diabetes mellitus with diabetic autonomic (poly)neuropathy: Secondary | ICD-10-CM | POA: Diagnosis not present

## 2016-10-17 DIAGNOSIS — L03116 Cellulitis of left lower limb: Secondary | ICD-10-CM | POA: Diagnosis not present

## 2016-10-17 DIAGNOSIS — I10 Essential (primary) hypertension: Secondary | ICD-10-CM

## 2016-10-17 DIAGNOSIS — G894 Chronic pain syndrome: Secondary | ICD-10-CM

## 2016-10-17 DIAGNOSIS — I5032 Chronic diastolic (congestive) heart failure: Secondary | ICD-10-CM
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
## 2016-10-29 DIAGNOSIS — Z89611 Acquired absence of right leg above knee: Secondary | ICD-10-CM | POA: Diagnosis not present

## 2016-10-29 DIAGNOSIS — E119 Type 2 diabetes mellitus without complications: Secondary | ICD-10-CM | POA: Diagnosis not present

## 2016-10-29 DIAGNOSIS — I739 Peripheral vascular disease, unspecified: Secondary | ICD-10-CM | POA: Diagnosis not present

## 2016-10-29 DIAGNOSIS — B351 Tinea unguium: Secondary | ICD-10-CM | POA: Diagnosis not present

## 2016-11-12 ENCOUNTER — Inpatient Hospital Stay (HOSPITAL_COMMUNITY): Payer: Medicare Other

## 2016-11-12 ENCOUNTER — Encounter (HOSPITAL_COMMUNITY): Payer: Self-pay | Admitting: Emergency Medicine

## 2016-11-12 ENCOUNTER — Inpatient Hospital Stay (HOSPITAL_COMMUNITY)
Admission: EM | Admit: 2016-11-12 | Discharge: 2016-11-19 | DRG: 871 | Disposition: A | Discharge status: Home / Self Care | Payer: Medicare Other | Attending: Internal Medicine | Admitting: Internal Medicine

## 2016-11-12 ENCOUNTER — Emergency Department (HOSPITAL_COMMUNITY): Payer: Medicare Other

## 2016-11-12 DIAGNOSIS — G92 Toxic encephalopathy: Secondary | ICD-10-CM | POA: Diagnosis present

## 2016-11-12 DIAGNOSIS — A419 Sepsis, unspecified organism: Secondary | ICD-10-CM | POA: Diagnosis not present

## 2016-11-12 DIAGNOSIS — G4733 Obstructive sleep apnea (adult) (pediatric): Secondary | ICD-10-CM | POA: Diagnosis not present

## 2016-11-12 DIAGNOSIS — J9621 Acute and chronic respiratory failure with hypoxia: Secondary | ICD-10-CM | POA: Diagnosis present

## 2016-11-12 DIAGNOSIS — J9612 Chronic respiratory failure with hypercapnia: Secondary | ICD-10-CM | POA: Diagnosis not present

## 2016-11-12 DIAGNOSIS — E1149 Type 2 diabetes mellitus with other diabetic neurological complication: Secondary | ICD-10-CM | POA: Diagnosis present

## 2016-11-12 DIAGNOSIS — J9622 Acute and chronic respiratory failure with hypercapnia: Secondary | ICD-10-CM | POA: Diagnosis present

## 2016-11-12 DIAGNOSIS — E1165 Type 2 diabetes mellitus with hyperglycemia: Secondary | ICD-10-CM

## 2016-11-12 DIAGNOSIS — I5032 Chronic diastolic (congestive) heart failure: Secondary | ICD-10-CM | POA: Diagnosis present

## 2016-11-12 DIAGNOSIS — E872 Acidosis: Secondary | ICD-10-CM | POA: Diagnosis present

## 2016-11-12 DIAGNOSIS — F4489 Other dissociative and conversion disorders: Secondary | ICD-10-CM | POA: Diagnosis not present

## 2016-11-12 DIAGNOSIS — R509 Fever, unspecified: Secondary | ICD-10-CM

## 2016-11-12 DIAGNOSIS — Z8249 Family history of ischemic heart disease and other diseases of the circulatory system: Secondary | ICD-10-CM

## 2016-11-12 DIAGNOSIS — Z6841 Body Mass Index (BMI) 40.0 and over, adult: Secondary | ICD-10-CM | POA: Diagnosis not present

## 2016-11-12 DIAGNOSIS — J9620 Acute and chronic respiratory failure, unspecified whether with hypoxia or hypercapnia: Secondary | ICD-10-CM | POA: Diagnosis not present

## 2016-11-12 DIAGNOSIS — Z993 Dependence on wheelchair: Secondary | ICD-10-CM

## 2016-11-12 DIAGNOSIS — D631 Anemia in chronic kidney disease: Secondary | ICD-10-CM | POA: Diagnosis not present

## 2016-11-12 DIAGNOSIS — R6520 Severe sepsis without septic shock: Secondary | ICD-10-CM | POA: Diagnosis present

## 2016-11-12 DIAGNOSIS — L8992 Pressure ulcer of unspecified site, stage 2: Secondary | ICD-10-CM | POA: Diagnosis not present

## 2016-11-12 DIAGNOSIS — L97529 Non-pressure chronic ulcer of other part of left foot with unspecified severity: Secondary | ICD-10-CM | POA: Diagnosis not present

## 2016-11-12 DIAGNOSIS — Z79899 Other long term (current) drug therapy: Secondary | ICD-10-CM

## 2016-11-12 DIAGNOSIS — Z794 Long term (current) use of insulin: Secondary | ICD-10-CM | POA: Diagnosis not present

## 2016-11-12 DIAGNOSIS — Z89611 Acquired absence of right leg above knee: Secondary | ICD-10-CM | POA: Diagnosis not present

## 2016-11-12 DIAGNOSIS — R0902 Hypoxemia: Secondary | ICD-10-CM

## 2016-11-12 DIAGNOSIS — Z89511 Acquired absence of right leg below knee: Secondary | ICD-10-CM

## 2016-11-12 DIAGNOSIS — R600 Localized edema: Secondary | ICD-10-CM | POA: Diagnosis not present

## 2016-11-12 DIAGNOSIS — I87312 Chronic venous hypertension (idiopathic) with ulcer of left lower extremity: Secondary | ICD-10-CM | POA: Diagnosis not present

## 2016-11-12 DIAGNOSIS — Z9989 Dependence on other enabling machines and devices: Secondary | ICD-10-CM

## 2016-11-12 DIAGNOSIS — B373 Candidiasis of vulva and vagina: Secondary | ICD-10-CM | POA: Diagnosis present

## 2016-11-12 DIAGNOSIS — I130 Hypertensive heart and chronic kidney disease with heart failure and stage 1 through stage 4 chronic kidney disease, or unspecified chronic kidney disease: Secondary | ICD-10-CM | POA: Diagnosis present

## 2016-11-12 DIAGNOSIS — Z7951 Long term (current) use of inhaled steroids: Secondary | ICD-10-CM

## 2016-11-12 DIAGNOSIS — N179 Acute kidney failure, unspecified: Secondary | ICD-10-CM | POA: Diagnosis present

## 2016-11-12 DIAGNOSIS — N183 Chronic kidney disease, stage 3 unspecified: Secondary | ICD-10-CM | POA: Diagnosis present

## 2016-11-12 DIAGNOSIS — E119 Type 2 diabetes mellitus without complications: Secondary | ICD-10-CM | POA: Diagnosis not present

## 2016-11-12 DIAGNOSIS — I10 Essential (primary) hypertension: Secondary | ICD-10-CM | POA: Diagnosis not present

## 2016-11-12 DIAGNOSIS — L97929 Non-pressure chronic ulcer of unspecified part of left lower leg with unspecified severity: Secondary | ICD-10-CM | POA: Diagnosis not present

## 2016-11-12 DIAGNOSIS — A4151 Sepsis due to Escherichia coli [E. coli]: Secondary | ICD-10-CM | POA: Diagnosis not present

## 2016-11-12 DIAGNOSIS — M6281 Muscle weakness (generalized): Secondary | ICD-10-CM | POA: Diagnosis not present

## 2016-11-12 DIAGNOSIS — B9620 Unspecified Escherichia coli [E. coli] as the cause of diseases classified elsewhere: Secondary | ICD-10-CM | POA: Diagnosis present

## 2016-11-12 DIAGNOSIS — Z7401 Bed confinement status: Secondary | ICD-10-CM

## 2016-11-12 DIAGNOSIS — I959 Hypotension, unspecified: Secondary | ICD-10-CM | POA: Diagnosis present

## 2016-11-12 DIAGNOSIS — Z9119 Patient's noncompliance with other medical treatment and regimen: Secondary | ICD-10-CM | POA: Diagnosis not present

## 2016-11-12 DIAGNOSIS — E1122 Type 2 diabetes mellitus with diabetic chronic kidney disease: Secondary | ICD-10-CM | POA: Diagnosis present

## 2016-11-12 DIAGNOSIS — IMO0002 Reserved for concepts with insufficient information to code with codable children: Secondary | ICD-10-CM | POA: Diagnosis present

## 2016-11-12 DIAGNOSIS — Z7982 Long term (current) use of aspirin: Secondary | ICD-10-CM | POA: Diagnosis not present

## 2016-11-12 DIAGNOSIS — R0602 Shortness of breath: Secondary | ICD-10-CM | POA: Diagnosis not present

## 2016-11-12 DIAGNOSIS — J9602 Acute respiratory failure with hypercapnia: Secondary | ICD-10-CM | POA: Diagnosis not present

## 2016-11-12 DIAGNOSIS — Z888 Allergy status to other drugs, medicaments and biological substances status: Secondary | ICD-10-CM

## 2016-11-12 DIAGNOSIS — G8929 Other chronic pain: Secondary | ICD-10-CM | POA: Diagnosis present

## 2016-11-12 DIAGNOSIS — E1151 Type 2 diabetes mellitus with diabetic peripheral angiopathy without gangrene: Secondary | ICD-10-CM | POA: Diagnosis present

## 2016-11-12 DIAGNOSIS — G9340 Encephalopathy, unspecified: Secondary | ICD-10-CM | POA: Diagnosis not present

## 2016-11-12 DIAGNOSIS — L03116 Cellulitis of left lower limb: Secondary | ICD-10-CM | POA: Diagnosis present

## 2016-11-12 DIAGNOSIS — F329 Major depressive disorder, single episode, unspecified: Secondary | ICD-10-CM | POA: Diagnosis not present

## 2016-11-12 DIAGNOSIS — E1140 Type 2 diabetes mellitus with diabetic neuropathy, unspecified: Secondary | ICD-10-CM | POA: Diagnosis not present

## 2016-11-12 DIAGNOSIS — B9562 Methicillin resistant Staphylococcus aureus infection as the cause of diseases classified elsewhere: Secondary | ICD-10-CM | POA: Diagnosis present

## 2016-11-12 DIAGNOSIS — R319 Hematuria, unspecified: Secondary | ICD-10-CM

## 2016-11-12 DIAGNOSIS — K219 Gastro-esophageal reflux disease without esophagitis: Secondary | ICD-10-CM | POA: Diagnosis present

## 2016-11-12 DIAGNOSIS — L03115 Cellulitis of right lower limb: Secondary | ICD-10-CM | POA: Diagnosis not present

## 2016-11-12 DIAGNOSIS — N390 Urinary tract infection, site not specified: Secondary | ICD-10-CM | POA: Diagnosis not present

## 2016-11-12 DIAGNOSIS — I7389 Other specified peripheral vascular diseases: Secondary | ICD-10-CM | POA: Diagnosis not present

## 2016-11-12 DIAGNOSIS — I6789 Other cerebrovascular disease: Secondary | ICD-10-CM | POA: Diagnosis not present

## 2016-11-12 DIAGNOSIS — I872 Venous insufficiency (chronic) (peripheral): Secondary | ICD-10-CM | POA: Diagnosis present

## 2016-11-12 DIAGNOSIS — M79652 Pain in left thigh: Secondary | ICD-10-CM

## 2016-11-12 LAB — BLOOD GAS, ARTERIAL
ACID-BASE EXCESS: 7.4 mmol/L — AB (ref 0.0–2.0)
ACID-BASE EXCESS: 5.6 mmol/L — AB (ref 0.0–2.0)
Acid-Base Excess: 6 mmol/L — ABNORMAL HIGH (ref 0.0–2.0)
BICARBONATE: 34.9 mmol/L — AB (ref 20.0–28.0)
BICARBONATE: 32.9 mmol/L — AB (ref 20.0–28.0)
BICARBONATE: 31.5 mmol/L — AB (ref 20.0–28.0)
DRAWN BY: 232811
DRAWN BY: 11249
Delivery systems: POSITIVE
Drawn by: 103701
Expiratory PAP: 8
O2 CONTENT: 4 L/min
O2 CONTENT: 3 L/min
O2 Content: 3 L/min
O2 SAT: 94.8 %
O2 Saturation: 93.4 %
O2 Saturation: 91.6 %
PATIENT TEMPERATURE: 99.1
PATIENT TEMPERATURE: 99.6
PATIENT TEMPERATURE: 98.6
PCO2 ART: 55 mmHg — AB (ref 32.0–48.0)
PH ART: 7.376 (ref 7.350–7.450)
PO2 ART: 75.7 mmHg — AB (ref 83.0–108.0)
pCO2 arterial: 68.3 mmHg (ref 32.0–48.0)
pCO2 arterial: 63.8 mmHg — ABNORMAL HIGH (ref 32.0–48.0)
pH, Arterial: 7.33 — ABNORMAL LOW (ref 7.350–7.450)
pH, Arterial: 7.336 — ABNORMAL LOW (ref 7.350–7.450)
pO2, Arterial: 82.4 mmHg — ABNORMAL LOW (ref 83.0–108.0)
pO2, Arterial: 63.6 mmHg — ABNORMAL LOW (ref 83.0–108.0)

## 2016-11-12 LAB — CBC WITH DIFFERENTIAL/PLATELET
BASOS ABS: 0.1 10*3/uL (ref 0.0–0.1)
BASOS PCT: 0 %
Basophils Absolute: 0 10*3/uL (ref 0.0–0.1)
Basophils Relative: 0 %
EOS ABS: 0.2 10*3/uL (ref 0.0–0.7)
EOS PCT: 1 %
Eosinophils Absolute: 0 10*3/uL (ref 0.0–0.7)
Eosinophils Relative: 0 %
HCT: 40.6 % (ref 36.0–46.0)
HEMATOCRIT: 42.7 % (ref 36.0–46.0)
HEMOGLOBIN: 12.5 g/dL (ref 12.0–15.0)
HEMOGLOBIN: 13.6 g/dL (ref 12.0–15.0)
LYMPHS ABS: 1.4 10*3/uL (ref 0.7–4.0)
LYMPHS PCT: 9 %
Lymphocytes Relative: 3 %
Lymphs Abs: 0.5 10*3/uL — ABNORMAL LOW (ref 0.7–4.0)
MCH: 27.7 pg (ref 26.0–34.0)
MCH: 28.3 pg (ref 26.0–34.0)
MCHC: 30.8 g/dL (ref 30.0–36.0)
MCHC: 31.9 g/dL (ref 30.0–36.0)
MCV: 89.8 fL (ref 78.0–100.0)
MCV: 89 fL (ref 78.0–100.0)
MONOS PCT: 3 %
MONOS PCT: 3 %
Monocytes Absolute: 0.5 10*3/uL (ref 0.1–1.0)
Monocytes Absolute: 0.6 10*3/uL (ref 0.1–1.0)
NEUTROS ABS: 18.8 10*3/uL — AB (ref 1.7–7.7)
NEUTROS PCT: 94 %
Neutro Abs: 13.2 10*3/uL — ABNORMAL HIGH (ref 1.7–7.7)
Neutrophils Relative %: 87 %
PLATELETS: 231 10*3/uL (ref 150–400)
Platelets: 180 10*3/uL (ref 150–400)
RBC: 4.52 MIL/uL (ref 3.87–5.11)
RBC: 4.8 MIL/uL (ref 3.87–5.11)
RDW: 17.2 % — ABNORMAL HIGH (ref 11.5–15.5)
RDW: 17.5 % — ABNORMAL HIGH (ref 11.5–15.5)
WBC: 15.3 10*3/uL — AB (ref 4.0–10.5)
WBC: 19.9 10*3/uL — ABNORMAL HIGH (ref 4.0–10.5)

## 2016-11-12 LAB — COMPREHENSIVE METABOLIC PANEL
ALBUMIN: 3.1 g/dL — AB (ref 3.5–5.0)
ALBUMIN: UNDETERMINED g/dL (ref 3.5–5.0)
ALK PHOS: 170 U/L — AB (ref 38–126)
ALK PHOS: 118 U/L (ref 38–126)
ALT: 31 U/L (ref 14–54)
ALT: 25 U/L (ref 14–54)
AST: 36 U/L (ref 15–41)
AST: UNDETERMINED U/L (ref 15–41)
Anion gap: 9 (ref 5–15)
Anion gap: 9 (ref 5–15)
BILIRUBIN TOTAL: 0.8 mg/dL (ref 0.3–1.2)
BILIRUBIN TOTAL: UNDETERMINED mg/dL (ref 0.3–1.2)
BUN: 41 mg/dL — AB (ref 6–20)
BUN: UNDETERMINED mg/dL (ref 6–20)
CALCIUM: 9.4 mg/dL (ref 8.9–10.3)
CALCIUM: 8.6 mg/dL — AB (ref 8.9–10.3)
CO2: 37 mmol/L — ABNORMAL HIGH (ref 22–32)
CO2: 31 mmol/L (ref 22–32)
CREATININE: UNDETERMINED mg/dL (ref 0.44–1.00)
Chloride: 93 mmol/L — ABNORMAL LOW (ref 101–111)
Chloride: 102 mmol/L (ref 101–111)
Creatinine, Ser: 1.58 mg/dL — ABNORMAL HIGH (ref 0.44–1.00)
GFR calc Af Amer: 37 mL/min — ABNORMAL LOW (ref >60)
GFR calc non Af Amer: 32 mL/min — ABNORMAL LOW (ref >60)
GLUCOSE: 192 mg/dL — AB (ref 65–99)
GLUCOSE: 113 mg/dL — AB (ref 65–99)
Potassium: 4.6 mmol/L (ref 3.5–5.1)
Potassium: 4.5 mmol/L (ref 3.5–5.1)
SODIUM: 139 mmol/L (ref 135–145)
Sodium: 142 mmol/L (ref 135–145)
TOTAL PROTEIN: 8.9 g/dL — AB (ref 6.5–8.1)
TOTAL PROTEIN: UNDETERMINED g/dL (ref 6.5–8.1)

## 2016-11-12 LAB — GLUCOSE, CAPILLARY
GLUCOSE-CAPILLARY: 86 mg/dL (ref 65–99)
Glucose-Capillary: 108 mg/dL — ABNORMAL HIGH (ref 65–99)
Glucose-Capillary: 99 mg/dL (ref 65–99)

## 2016-11-12 LAB — INFLUENZA PANEL BY PCR (TYPE A & B)
INFLBPCR: NEGATIVE
Influenza A By PCR: NEGATIVE

## 2016-11-12 LAB — LACTIC ACID, PLASMA
LACTIC ACID, VENOUS: 2.3 mmol/L — AB (ref 0.5–1.9)
Lactic Acid, Venous: 3.2 mmol/L (ref 0.5–1.9)
Lactic Acid, Venous: 2.5 mmol/L (ref 0.5–1.9)

## 2016-11-12 LAB — URINALYSIS, ROUTINE W REFLEX MICROSCOPIC
Bilirubin Urine: NEGATIVE
Glucose, UA: NEGATIVE mg/dL
Ketones, ur: 5 mg/dL — AB
Nitrite: NEGATIVE
PROTEIN: 100 mg/dL — AB
SPECIFIC GRAVITY, URINE: 1.014 (ref 1.005–1.030)
pH: 5 (ref 5.0–8.0)

## 2016-11-12 LAB — APTT: aPTT: 27 seconds (ref 24–36)

## 2016-11-12 LAB — I-STAT CG4 LACTIC ACID, ED
LACTIC ACID, VENOUS: 2.68 mmol/L — AB (ref 0.5–1.9)
Lactic Acid, Venous: 2.95 mmol/L (ref 0.5–1.9)

## 2016-11-12 LAB — CBG MONITORING, ED
GLUCOSE-CAPILLARY: 193 mg/dL — AB (ref 65–99)
GLUCOSE-CAPILLARY: 132 mg/dL — AB (ref 65–99)
Glucose-Capillary: 200 mg/dL — ABNORMAL HIGH (ref 65–99)
Glucose-Capillary: 155 mg/dL — ABNORMAL HIGH (ref 65–99)

## 2016-11-12 LAB — AMMONIA: AMMONIA: 45 umol/L — AB (ref 9–35)

## 2016-11-12 LAB — PROTIME-INR
INR: 1.06
INR: 1.12
PROTHROMBIN TIME: 13.8 s (ref 11.4–15.2)
Prothrombin Time: 14.5 seconds (ref 11.4–15.2)

## 2016-11-12 LAB — MRSA PCR SCREENING: MRSA by PCR: POSITIVE — AB

## 2016-11-12 LAB — PROCALCITONIN: Procalcitonin: 12.76 ng/mL

## 2016-11-12 MED ORDER — SODIUM CHLORIDE 0.9 % IV SOLN
1.0000 g | INTRAVENOUS | Status: AC
  Administered 2016-11-12: 1 g via INTRAVENOUS
  Filled 2016-11-12: qty 1

## 2016-11-12 MED ORDER — SODIUM CHLORIDE 0.9 % IV SOLN
INTRAVENOUS | Status: DC
  Administered 2016-11-12: 125 mL/h via INTRAVENOUS
  Administered 2016-11-12: 16:00:00 via INTRAVENOUS

## 2016-11-12 MED ORDER — ONDANSETRON HCL 4 MG/2ML IJ SOLN: 4.0000 mg | Freq: Four times a day (QID) | INTRAMUSCULAR | Status: DC | PRN

## 2016-11-12 MED ORDER — ACETAMINOPHEN 650 MG RE SUPP
650.0000 mg | Freq: Four times a day (QID) | RECTAL | Status: DC | PRN
  Administered 2016-11-12: 650 mg via RECTAL
  Filled 2016-11-12: qty 1

## 2016-11-12 MED ORDER — IOPAMIDOL (ISOVUE-370) INJECTION 76%
INTRAVENOUS | Status: AC
  Filled 2016-11-12: qty 100

## 2016-11-12 MED ORDER — SODIUM CHLORIDE 0.9 % IV BOLUS (SEPSIS)
500.0000 mL | Freq: Once | INTRAVENOUS | Status: AC
  Administered 2016-11-12: 500 mL via INTRAVENOUS

## 2016-11-12 MED ORDER — MUPIROCIN 2 % EX OINT
1.0000 "application " | TOPICAL_OINTMENT | Freq: Two times a day (BID) | CUTANEOUS | Status: AC
  Administered 2016-11-12 – 2016-11-17 (×10): 1 via NASAL
  Filled 2016-11-12 (×2): qty 22

## 2016-11-12 MED ORDER — SODIUM CHLORIDE 0.9 % IV BOLUS (SEPSIS)
1000.0000 mL | Freq: Once | INTRAVENOUS | Status: AC
  Administered 2016-11-12: 1000 mL via INTRAVENOUS

## 2016-11-12 MED ORDER — SODIUM CHLORIDE 0.9 % IV BOLUS (SEPSIS): 1000.0000 mL | Freq: Once | INTRAVENOUS | Status: DC

## 2016-11-12 MED ORDER — BUDESONIDE 0.25 MG/2ML IN SUSP
0.2500 mg | Freq: Two times a day (BID) | RESPIRATORY_TRACT | Status: DC
  Administered 2016-11-12 – 2016-11-19 (×15): 0.25 mg via RESPIRATORY_TRACT
  Filled 2016-11-12 (×14): qty 2

## 2016-11-12 MED ORDER — IPRATROPIUM-ALBUTEROL 0.5-2.5 (3) MG/3ML IN SOLN
3.0000 mL | Freq: Four times a day (QID) | RESPIRATORY_TRACT | Status: DC
  Administered 2016-11-12 – 2016-11-13 (×5): 3 mL via RESPIRATORY_TRACT
  Filled 2016-11-12 (×5): qty 3

## 2016-11-12 MED ORDER — ONDANSETRON HCL 4 MG PO TABS: 4.0000 mg | ORAL_TABLET | Freq: Four times a day (QID) | ORAL | Status: DC | PRN

## 2016-11-12 MED ORDER — ACETAMINOPHEN 10 MG/ML IV SOLN
1000.0000 mg | Freq: Four times a day (QID) | INTRAVENOUS | Status: DC
  Filled 2016-11-12 (×2): qty 100

## 2016-11-12 MED ORDER — VANCOMYCIN HCL IN DEXTROSE 1-5 GM/200ML-% IV SOLN
1000.0000 mg | Freq: Once | INTRAVENOUS | Status: AC
  Administered 2016-11-12: 1000 mg via INTRAVENOUS
  Filled 2016-11-12: qty 200

## 2016-11-12 MED ORDER — SODIUM CHLORIDE 0.9 % IV BOLUS (SEPSIS)
1000.0000 mL | Freq: Once | INTRAVENOUS | Status: AC
Start: 2016-11-12 — End: 2016-11-12
  Administered 2016-11-12: 1000 mL via INTRAVENOUS

## 2016-11-12 MED ORDER — SODIUM CHLORIDE 0.9 % IJ SOLN
INTRAMUSCULAR | Status: AC
Start: 2016-11-12 — End: 2016-11-13
  Filled 2016-11-12: qty 50

## 2016-11-12 MED ORDER — ACETAMINOPHEN 10 MG/ML IV SOLN
1000.0000 mg | Freq: Four times a day (QID) | INTRAVENOUS | Status: AC
  Administered 2016-11-12 – 2016-11-13 (×4): 1000 mg via INTRAVENOUS
  Filled 2016-11-12 (×4): qty 100

## 2016-11-12 MED ORDER — ACETAMINOPHEN 325 MG PO TABS
650.0000 mg | ORAL_TABLET | Freq: Four times a day (QID) | ORAL | Status: DC | PRN
  Administered 2016-11-12: 650 mg via ORAL
  Filled 2016-11-12: qty 2

## 2016-11-12 MED ORDER — IPRATROPIUM-ALBUTEROL 0.5-2.5 (3) MG/3ML IN SOLN
3.0000 mL | RESPIRATORY_TRACT | Status: DC
  Administered 2016-11-12: 3 mL via RESPIRATORY_TRACT
  Filled 2016-11-12: qty 3

## 2016-11-12 MED ORDER — SODIUM CHLORIDE 0.9 % IV SOLN
1.0000 g | Freq: Two times a day (BID) | INTRAVENOUS | Status: DC
  Administered 2016-11-13 – 2016-11-15 (×5): 1 g via INTRAVENOUS
  Filled 2016-11-12 (×7): qty 1

## 2016-11-12 MED ORDER — INSULIN ASPART 100 UNIT/ML ~~LOC~~ SOLN
0.0000 [IU] | SUBCUTANEOUS | Status: DC
  Administered 2016-11-12 – 2016-11-14 (×5): 2 [IU] via SUBCUTANEOUS
  Administered 2016-11-15 (×3): 1 [IU] via SUBCUTANEOUS
  Administered 2016-11-15: 2 [IU] via SUBCUTANEOUS
  Administered 2016-11-16: 1 [IU] via SUBCUTANEOUS
  Administered 2016-11-16: 2 [IU] via SUBCUTANEOUS
  Administered 2016-11-16 – 2016-11-17 (×4): 1 [IU] via SUBCUTANEOUS
  Administered 2016-11-17: 3 [IU] via SUBCUTANEOUS
  Administered 2016-11-18 (×2): 2 [IU] via SUBCUTANEOUS
  Filled 2016-11-12 (×2): qty 1

## 2016-11-12 MED ORDER — ENOXAPARIN SODIUM 60 MG/0.6ML ~~LOC~~ SOLN
60.0000 mg | SUBCUTANEOUS | Status: DC
  Administered 2016-11-12 – 2016-11-15 (×4): 60 mg via SUBCUTANEOUS
  Filled 2016-11-12 (×5): qty 0.6

## 2016-11-12 MED ORDER — VANCOMYCIN HCL 10 G IV SOLR
1500.0000 mg | INTRAVENOUS | Status: DC
  Administered 2016-11-12 – 2016-11-14 (×3): 1500 mg via INTRAVENOUS
  Filled 2016-11-12 (×5): qty 1500

## 2016-11-12 MED ORDER — CHLORHEXIDINE GLUCONATE CLOTH 2 % EX PADS
6.0000 | MEDICATED_PAD | Freq: Every day | CUTANEOUS | Status: AC
  Administered 2016-11-13 – 2016-11-17 (×5): 6 via TOPICAL

## 2016-11-12 MED ORDER — PIPERACILLIN-TAZOBACTAM 3.375 G IVPB
3.3750 g | Freq: Three times a day (TID) | INTRAVENOUS | Status: DC
  Administered 2016-11-12: 3.375 g via INTRAVENOUS
  Filled 2016-11-12: qty 50

## 2016-11-12 MED ORDER — IPRATROPIUM-ALBUTEROL 0.5-2.5 (3) MG/3ML IN SOLN: 3.0000 mL | RESPIRATORY_TRACT | Status: DC | PRN

## 2016-11-12 MED ORDER — FLUTICASONE PROPIONATE 50 MCG/ACT NA SUSP
2.0000 | Freq: Every day | NASAL | Status: DC
  Administered 2016-11-13 – 2016-11-18 (×4): 2 via NASAL
  Filled 2016-11-12 (×2): qty 16

## 2016-11-12 MED ORDER — IOPAMIDOL (ISOVUE-370) INJECTION 76%
100.0000 mL | Freq: Once | INTRAVENOUS | Status: AC | PRN
  Administered 2016-11-12: 80 mL via INTRAVENOUS

## 2016-11-12 MED ORDER — PIPERACILLIN-TAZOBACTAM 3.375 G IVPB 30 MIN
3.3750 g | Freq: Once | INTRAVENOUS | Status: AC
  Administered 2016-11-12: 3.375 g via INTRAVENOUS
  Filled 2016-11-12: qty 50

## 2016-11-12 NOTE — ED Triage Notes (Addendum)
Pt BIB EMS from [facility] for fever and chills; pt alert but appears to have altered mental status; shivering is preventing pt from answering questions; nasal congestion is audible with respirations; hx of respiratory failure; 2L [place] at home  1g Tylenol given by EMS en route

## 2016-11-12 NOTE — ED Notes (Signed)
Bedside report completed with previous shift.

## 2016-11-12 NOTE — Progress Notes (Signed)
Pharmacy Antibiotic Note  Clarise Thielen. Moa is a 71 y.o. female c/o fever and chills admitted on 11/12/2016 with sepsis.  Pharmacy has been consulted for zosyn/vancomycin dosin then Zosyn changed to meropenem for concern for ESBL UTI  Plan: Meropenem 1g q12  Continue previous vancomycin dosing F/u Scr/cultures/levels    Temp (24hrs), Avg:102.3 F (39.1 C), Min:101.9 F (38.8 C), Max:102.6 F (39.2 C)   Recent Labs Lab 11/12/16 0200 11/12/16 0240 11/12/16 0510 11/12/16 0747  WBC 15.3*  --   --   --   CREATININE 1.58*  --   --   --   LATICACIDVEN  --  2.95* 2.68* 3.2*    CrCl cannot be calculated (Unknown ideal weight.).    Allergies  Allergen Reactions  . Ace Inhibitors     unknown    Antimicrobials this admission: 1/30 zosyn >> 1/30 1/30 meropenem >> 1/30 vancomycin >>   Dose adjustments this admission:   Microbiology results:  BCx:   UCx:    Sputum:    MRSA PCR:   Thank you for allowing pharmacy to be a part of this patient's care.  Adrian Saran, PharmD, BCPS Pager [phone] 11/12/2016 12:28 PM
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
## 2016-11-12 NOTE — ED Notes (Signed)
Discussed with charge nurse. Foley cath not to be inserted in the ED at this time.

## 2016-11-12 NOTE — Consult Note (Signed)
[facility] Nurse wound consult note Reason for Consult:Chronic, non-healing wounds on LLE Wound type:Venous insufficiency vs PAD. Etiology appears to be more consistent with arterial disease (perhaps mixed etiology) as they are punctate, pale. Pressure Injury POA: No Measurement:left medial LE: 2.5cm round x 0.2cm with red wound bed, moist Left anterior LE with 1.5cm x 1cm x 0.1cm red, moist Left lateral LE:  3cm round x 0.2cm with red, moist wound bed. Wound bed:red, moist Drainage (amount, consistency, odor) moderate amount of light yellow exudate on old dressing Periwound:WIth evidence of previous wound healing (scarring) Dressing procedure/placement/frequency: I will continue the dressings in place, I.e., xeroform with Kerlix to secure as the antimicrobial and astringent properties of the xeroform are indicated. [place] nursing team will not follow, but will remain available to this patient, the nursing and medical teams.  Please re-consult if needed. Thanks, Maudie Flakes, MSN, RN, [place], Arther Abbott  Pager# [phone]

## 2016-11-12 NOTE — Progress Notes (Signed)
Pt having repeated low BP readings.  Dr paged and 500 cc fluid bolus ordered.  Her blood pressure remains low and the Dr was paged.  Will continue to monitor

## 2016-11-12 NOTE — ED Notes (Signed)
Patient transported to CT 
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
## 2016-11-12 NOTE — Progress Notes (Signed)
Pharmacy Antibiotic Note  Heather Zimmerman is a 71 y.o. female c/o fever and chills admitted on 11/12/2016 with sepsis.  Pharmacy has been consulted for zosyn/vancomycin dosing.  Plan: Zosyn 3.375g IV q8h (4 hour infusion).  Vancomycin 1 Gm x1 then 1500 mg IV q24h (used old wt b/c ED never provided new wt, please f/u)  VT=15-20 mg/L F/u Scr/cultures/levels    Temp (24hrs), Avg:102.6 F (39.2 C), Min:102.6 F (39.2 C), Max:102.6 F (39.2 C)   Recent Labs Lab 11/12/16 0240  LATICACIDVEN 2.95*    CrCl cannot be calculated (Patient's most recent lab result is older than the maximum 21 days allowed.).    Allergies  Allergen Reactions  . Ace Inhibitors     unknown    Antimicrobials this admission: 1/30 zosyn >>  1/30 vancomycin >>   Dose adjustments this admission:   Microbiology results:  BCx:   UCx:    Sputum:    MRSA PCR:   Thank you for allowing pharmacy to be a part of this patient's care.  Dorrene German 11/12/2016 2:48 AM

## 2016-11-12 NOTE — Progress Notes (Signed)
RT placed patient on CPAP auto mode . Setting is 8-20 cmH2O. Patient is tolerating well and sats are maintaining at 100% on 4 liters. RT will decrease oxygen down to 2 liters. RT will continue to monitor.

## 2016-11-12 NOTE — Progress Notes (Signed)
[facility]  Interval Note   Patients admitted after midnight see H&P for full details.  Patient seen and examined. Patient is lethargic, but arousable to speak and touch Patient on BiPAP - last ABG shows some improvement.  Patient continues to have fever, Lactic acid remains elevated but trending down.   Plan  Sepsis 2/2 UTI -  UA grossly positive - concern for ESBL given multiple admissions for the same  Will switch Zosyn to [place] IVF - given aggressive hydration and hx of CHF will insert foley cath.  Follow up cultures  If unable to get accurate BP - will get A-line   Acute respiratory failure - given patient sedentary status will need to r/o PE - pO2 arterial Oxygenation continues to trend down and sinus tachy - Wells Score 3 intermediate probability  CTA Repeat ABG in AM   Chipper Oman, MD

## 2016-11-12 NOTE — ED Notes (Signed)
Blood draw delayed,  attempt unsuccessful. RN notified.  Pt currently receiving IV fluids.  RN sts "I believe only one of her IV's is working.  I will see if I can pull off the line once it is complete."

## 2016-11-12 NOTE — ED Notes (Signed)
Writer notified EDP [place] of abnormal I-stat lactic result

## 2016-11-13 ENCOUNTER — Inpatient Hospital Stay (HOSPITAL_COMMUNITY): Payer: Medicare Other

## 2016-11-13 DIAGNOSIS — E1140 Type 2 diabetes mellitus with diabetic neuropathy, unspecified: Secondary | ICD-10-CM

## 2016-11-13 DIAGNOSIS — N390 Urinary tract infection, site not specified: Secondary | ICD-10-CM

## 2016-11-13 DIAGNOSIS — Z794 Long term (current) use of insulin: Secondary | ICD-10-CM

## 2016-11-13 DIAGNOSIS — E1165 Type 2 diabetes mellitus with hyperglycemia: Secondary | ICD-10-CM

## 2016-11-13 DIAGNOSIS — J9612 Chronic respiratory failure with hypercapnia: Secondary | ICD-10-CM

## 2016-11-13 DIAGNOSIS — N183 Chronic kidney disease, stage 3 (moderate): Secondary | ICD-10-CM

## 2016-11-13 LAB — CBC WITH DIFFERENTIAL/PLATELET
BASOS ABS: 0 10*3/uL (ref 0.0–0.1)
BASOS PCT: 0 %
EOS PCT: 0 %
Eosinophils Absolute: 0.1 10*3/uL (ref 0.0–0.7)
HCT: 32.3 % — ABNORMAL LOW (ref 36.0–46.0)
Hemoglobin: 9.9 g/dL — ABNORMAL LOW (ref 12.0–15.0)
Lymphocytes Relative: 5 %
Lymphs Abs: 1 10*3/uL (ref 0.7–4.0)
MCH: 27.7 pg (ref 26.0–34.0)
MCHC: 30.7 g/dL (ref 30.0–36.0)
MCV: 90.5 fL (ref 78.0–100.0)
MONO ABS: 0.6 10*3/uL (ref 0.1–1.0)
MONOS PCT: 3 %
NEUTROS ABS: 16.7 10*3/uL — AB (ref 1.7–7.7)
Neutrophils Relative %: 91 %
PLATELETS: 160 10*3/uL (ref 150–400)
RBC: 3.57 MIL/uL — ABNORMAL LOW (ref 3.87–5.11)
RDW: 18.1 % — AB (ref 11.5–15.5)
WBC: 18.4 10*3/uL — ABNORMAL HIGH (ref 4.0–10.5)

## 2016-11-13 LAB — BLOOD GAS, ARTERIAL
Acid-Base Excess: 3.3 mmol/L — ABNORMAL HIGH (ref 0.0–2.0)
BICARBONATE: 29.3 mmol/L — AB (ref 20.0–28.0)
DRAWN BY: 232811
Delivery systems: POSITIVE
O2 CONTENT: 3 L/min
O2 SAT: 92.9 %
PCO2 ART: 56.7 mmHg — AB (ref 32.0–48.0)
PH ART: 7.337 — AB (ref 7.350–7.450)
Patient temperature: 99.5
pO2, Arterial: 70.5 mmHg — ABNORMAL LOW (ref 83.0–108.0)

## 2016-11-13 LAB — COMPREHENSIVE METABOLIC PANEL
ALBUMIN: 2.1 g/dL — AB (ref 3.5–5.0)
ALT: 21 U/L (ref 14–54)
ANION GAP: 7 (ref 5–15)
AST: 20 U/L (ref 15–41)
Alkaline Phosphatase: 88 U/L (ref 38–126)
BILIRUBIN TOTAL: 1 mg/dL (ref 0.3–1.2)
BUN: 33 mg/dL — AB (ref 6–20)
CHLORIDE: 107 mmol/L (ref 101–111)
CO2: 29 mmol/L (ref 22–32)
Calcium: 7.8 mg/dL — ABNORMAL LOW (ref 8.9–10.3)
Creatinine, Ser: 1.49 mg/dL — ABNORMAL HIGH (ref 0.44–1.00)
GFR calc Af Amer: 40 mL/min — ABNORMAL LOW (ref >60)
GFR calc non Af Amer: 34 mL/min — ABNORMAL LOW (ref >60)
GLUCOSE: 102 mg/dL — AB (ref 65–99)
POTASSIUM: 3.9 mmol/L (ref 3.5–5.1)
SODIUM: 143 mmol/L (ref 135–145)
TOTAL PROTEIN: 6.7 g/dL (ref 6.5–8.1)

## 2016-11-13 LAB — GLUCOSE, CAPILLARY
GLUCOSE-CAPILLARY: 91 mg/dL (ref 65–99)
GLUCOSE-CAPILLARY: 84 mg/dL (ref 65–99)
GLUCOSE-CAPILLARY: 109 mg/dL — AB (ref 65–99)
Glucose-Capillary: 79 mg/dL (ref 65–99)
Glucose-Capillary: 68 mg/dL (ref 65–99)
Glucose-Capillary: 88 mg/dL (ref 65–99)
Glucose-Capillary: 65 mg/dL (ref 65–99)

## 2016-11-13 LAB — LACTIC ACID, PLASMA: LACTIC ACID, VENOUS: 0.9 mmol/L (ref 0.5–1.9)

## 2016-11-13 LAB — CK: Total CK: 79 U/L (ref 38–234)

## 2016-11-13 LAB — PHOSPHORUS: Phosphorus: 2.8 mg/dL (ref 2.5–4.6)

## 2016-11-13 LAB — AMMONIA: Ammonia: 21 umol/L (ref 9–35)

## 2016-11-13 LAB — MAGNESIUM: MAGNESIUM: 1.8 mg/dL (ref 1.7–2.4)

## 2016-11-13 MED ORDER — SODIUM CHLORIDE 0.9 % IV SOLN: INTRAVENOUS | Status: DC | PRN

## 2016-11-13 MED ORDER — LIP MEDEX EX OINT
TOPICAL_OINTMENT | CUTANEOUS | Status: AC
  Administered 2016-11-13: 12:00:00
  Filled 2016-11-13: qty 7

## 2016-11-13 MED ORDER — SODIUM CHLORIDE 0.9 % IV SOLN: INTRAVENOUS | Status: DC

## 2016-11-13 MED ORDER — DEXTROSE 50 % IV SOLN
INTRAVENOUS | Status: AC
  Filled 2016-11-13: qty 50

## 2016-11-13 MED ORDER — DEXTROSE 50 % IV SOLN
INTRAVENOUS | Status: AC
  Administered 2016-11-13: 25 mL
  Filled 2016-11-13: qty 50

## 2016-11-13 MED ORDER — DEXTROSE-NACL 5-0.9 % IV SOLN
INTRAVENOUS | Status: DC
  Administered 2016-11-13: 22:00:00 via INTRAVENOUS

## 2016-11-13 NOTE — NC FL2 (Signed)
[place] LEVEL OF CARE SCREENING TOOL     IDENTIFICATION  Patient Name: Heather Zimmerman. [place]win Birthdate: Apr 20, 1946 Sex: female Admission Date (Current Location): 11/12/2016  [place] and [place] Number:  Herbalist and Address:  [facility],  [place], [place]      Provider Number: [id number]  Attending Physician Name and Address:  Doreatha Lew, MD  Relative Name and Phone Number:       Current Level of Care: Hospital Recommended Level of Care: [place] Prior Approval Number:    Date Approved/Denied:   PASRR Number:   [id number]   Discharge Plan: SNF    Current Diagnoses: Patient Active Problem List   Diagnosis Date Noted  . Acute renal failure superimposed on stage 3 chronic kidney disease ([place])   . Chronic respiratory failure with hypoxia ([place])   . Cellulitis of left thigh 09/23/2016  . UTI (urinary tract infection) 09/23/2016  . Hypoglycemia   . Acute on chronic diastolic congestive heart failure ([place])   . Oropharyngeal dysphagia   . Bacteremia   . Coag negative Staphylococcus bacteremia   . Chronic venous stasis dermatitis   . Respiratory failure ([place]) 07/20/2016  . Acute respiratory failure with hypercapnia ([place]) 07/20/2016  . Weakness of right upper extremity 07/09/2016  . Type II diabetes mellitus with neurological manifestations, uncontrolled ([place]) 04/23/2016  . Ventral hernia without obstruction or gangrene 02/24/2016  . Chronic respiratory failure with hypercapnia ([place]) 02/24/2016  . Essential hypertension, benign 02/24/2016  . Occult blood in stools 12/26/2015  . Hypoxemia   . Candidiasis of perineum 10/26/2015  . 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 CKD (chronic kidney disease) stage 3, GFR 30-59 ml/min 07/21/2015  . Cellulitis of left leg 07/19/2015  . Acute encephalopathy 07/18/2015  . Open wound of left  lower extremity 07/18/2015  . Sepsis ([place]) 07/18/2015  . Urinary retention 07/18/2015  . Venous ulcer of left leg ([place]) 12/27/2014  . Hx of right BKA ([place]) 11/22/2014  . Peripheral autonomic neuropathy due to diabetes mellitus ([place]) 10/09/2014  . OSA on CPAP 07/05/2014  . Acute on chronic respiratory failure with hypercapnia ([place]) 06/21/2014  . Morbid obesity ([place]) 08/30/2013  . Unspecified vitamin D deficiency 12/31/2012  . Disorder of magnesium metabolism 12/31/2012  . Depression 12/31/2012  . Chronic pain 12/31/2012  . Chronic diastolic heart failure ([place]) 12/31/2012  . GERD 12/31/2012    Orientation RESPIRATION BLADDER Height & Weight     Self, Time, Situation, Place  O2 (4L ) Incontinent Weight: (!) 300 lb 7.8 oz (136.3 kg) Height:  4\' 7"  (139.7 cm)  BEHAVIORAL SYMPTOMS/MOOD NEUROLOGICAL BOWEL NUTRITION STATUS        Diet (NPO)  AMBULATORY STATUS COMMUNICATION OF NEEDS Skin   Extensive Assist Verbally Other (Comment) (Incision- left leg 3)                       Personal Care Assistance Level of Assistance  Bathing, Feeding, Dressing Bathing Assistance: Maximum assistance Feeding assistance: Independent Dressing Assistance: Limited assistance     Functional Limitations Info             SPECIAL CARE FACTORS FREQUENCY                       Contractures      Additional Factors Info  Code Status, Allergies Code Status Info:  Full code Allergies Info: ACE INHIBITORS            Current Medications (11/13/2016):  This is the current hospital active medication list Current Facility-Administered Medications  Medication Dose Route Frequency Provider Last Rate Last Dose  . 0.9 %  sodium chloride infusion   Intra-arterial PRN Gardiner Barefoot, NP      . 0.9 %  sodium chloride infusion   Intravenous Continuous Doreatha Lew, MD 75 mL/hr at 11/13/16 1345    . acetaminophen (OFIRMEV) IV 1,000 mg  1,000 mg Intravenous Q6H Doreatha Lew, MD    1,000 mg at 11/13/16 1000  . budesonide (PULMICORT) nebulizer solution 0.25 mg  0.25 mg Nebulization BID Rise Patience, MD   0.25 mg at 11/13/16 0853  . Chlorhexidine Gluconate Cloth 2 % PADS 6 each  6 each Topical Q0600 Doreatha Lew, MD   6 each at 11/13/16 0600  . enoxaparin (LOVENOX) injection 60 mg  60 mg Subcutaneous Q24H Rise Patience, MD   60 mg at 11/12/16 1828  . fluticasone (FLONASE) 50 MCG/ACT nasal spray 2 spray  2 spray Each Nare QHS Rise Patience, MD      . insulin aspart (novoLOG) injection 0-9 Units  0-9 Units Subcutaneous Q4H Rise Patience, MD   2 Units at 11/12/16 1233  . ipratropium-albuterol (DUONEB) 0.5-2.5 (3) MG/3ML nebulizer solution 3 mL  3 mL Nebulization Q2H PRN Rise Patience, MD      . ipratropium-albuterol (DUONEB) 0.5-2.5 (3) MG/3ML nebulizer solution 3 mL  3 mL Nebulization Q6H Doreatha Lew, MD   3 mL at 11/13/16 1417  . meropenem (MERREM) 1 g in sodium chloride 0.9 % 100 mL IVPB  1 g Intravenous Q12H Adrian Saran, RPH   1 g at 11/13/16 1345  . mupirocin ointment (BACTROBAN) 2 % 1 application  1 application Nasal BID Doreatha Lew, MD   1 application at 0000000 1000  . ondansetron (ZOFRAN) tablet 4 mg  4 mg Oral Q6H PRN Rise Patience, MD       Or  . ondansetron [facility]) injection 4 mg  4 mg Intravenous Q6H PRN Rise Patience, MD      . vancomycin (VANCOCIN) 1,500 mg in sodium chloride 0.9 % 500 mL IVPB  1,500 mg Intravenous Q24H Dorrene German, RPH   1,500 mg at 11/13/16 1345     Discharge Medications: Please see discharge summary for a list of discharge medications.  Relevant Imaging Results:  Relevant Lab Results:   Additional Information SS#: [id number]  Weston Anna, LCSW

## 2016-11-13 NOTE — Clinical Social Work Note (Addendum)
Clinical Social Work Assessment  Patient Details  Name: Heather Zimmerman MRN: [id number] Date of Birth: 1945/12/22  Date of referral:                  Reason for consult:  Facility Placement                Permission sought to share information with:  Facility Art therapist granted to share information::  Yes, Verbal Permission Granted  Name::        Agency::     Relationship::     Contact Information:     Housing/Transportation Living arrangements for the past 2 months:  SNF- long term  Source of Information:  Patient Patient Interpreter Needed:  None Criminal Activity/Legal Involvement Pertinent to Current Situation/Hospitalization:    Significant Relationships:    Lives with:  Self Do you feel safe going back to the place where you live?  No Need for family participation in patient care:  Yes (Comment)  Care giving concerns:  Patient asked questions regarding plan of care.    Social Worker assessment / plan:  CSW spoke with patient via bedside regarding discharge plans. Patient has been a resident at [facility] as a long-term resident for 4 years. Patient prefer to go to different facility. CSW informed patient that information would be faxed to [facility] however patient states she would prefer closer to [facility]. CSW will fax information to all counties. CSW will update patient with bed offers.   Employment status:  Retired Forensic scientist:  [employer] PT Recommendations:  [place] / Referral to community resources:  [place]  Patient/Family's Response to care: Patient appreciated CSW.   Patient/Family's Understanding of and Emotional Response to Diagnosis, Current Treatment, and Prognosis:  Patient understands current diagnosis and treatment plan.   Emotional Assessment Appearance:  Appears stated age Attitude/Demeanor/Rapport:    Affect (typically observed):  Pleasant, Calm,  Appropriate Orientation:  Oriented to Self, Oriented to Place, Oriented to  Time, Oriented to Situation Alcohol / Substance use:    Psych involvement (Current and /or in the community):  No (Comment)  Discharge Needs  Concerns to be addressed:  Discharge Planning Concerns (unsure of discharge plan at this time) Readmission within the last 30 days:  Yes Current discharge risk:  None Barriers to Discharge:  No Barriers Identified   Weston Anna, LCSW 11/13/2016, 2:56 PM

## 2016-11-13 NOTE — Progress Notes (Signed)
RN paged NP due to pt being hypotensive. 2, .5L boluses were given. Pt admitted with sepsis secondary to UTI (ESBL suspected). Hx CHF. Hypoxic respiratory failure on bipap. After above boluses, BP still in the 70s. NP to bedside. S: Denies pain, dizziness, SOB.  O: Chronically ill appearing obese AAF in NAD. On Bipap. Awakens easily. Answers questions appropriately. Speech clear and appropirate. UO of 900cc since 1700 hrs on 11/12/16.  A/P: 1. Persistent hypotension. Pt's exam is not consistent with her BP reads. Pt has received IVF of approximately 6L since admission. Will place A line because pt is large and NP questions as to whether her BPs are accurate given the inconsistent exam and readings. A line placed by RT and first SBP 97. Continue to monitor BP, mental status, and urine output. Try to avoid further boluses given hx CHF.  KJKG, NP [facility]

## 2016-11-13 NOTE — Progress Notes (Signed)
RT assessed left radial aline per RN's request.  Unable to draw from aline.  Catheter removed and intact.  Pt tolerated well.  No swelling or redness noted, site cleaned with antiseptic.  RN aware.

## 2016-11-13 NOTE — Procedures (Signed)
Arterial Catheter Insertion Procedure Note Heather Zimmerman. [place] [id number] 21-Jun-1946  Procedure: Insertion of Arterial Catheter  Indications: Blood pressure monitoring  Procedure Details Consent: Risks of procedure as well as the alternatives and risks of each were explained to the (patient/caregiver).  Consent for procedure obtained. Time Out: Verified patient identification, verified procedure, site/side was marked, verified correct patient position, special equipment/implants available, medications/allergies/relevent history reviewed, required imaging and test results available.  Performed  Maximum sterile technique was used including antiseptics, cap, gloves, gown, hand hygiene and mask. Skin prep: Chlorhexidine; local anesthetic administered 20 gauge catheter was inserted into left radial artery using the Seldinger technique.  Evaluation Blood flow good; BP tracing good. Complications: No apparent complications.   Heather Zimmerman P 11/13/2016

## 2016-11-13 NOTE — Progress Notes (Signed)
PROGRESS NOTE [facility] [employer] [name]   [id number] DOB: April 05, 1946  DOA: 11/12/2016 PCP: Gildardo Cranker, DO   Brief Narrative:  Heather Zimmerman. Jeppsen is a 71 y.o. female with history of diastolic CHF, noncompliant with CPAP for sleep apnea, diabetes mellitus, peripheral vascular disease status post right BKA, morbid obesity was brought to the ER after patient was found to be confused and having fever and chills. Patient admitted for sepsis from UTI, Acute respiratory failure and hypotension. Started on IVF, IV abx and BiPAP.   Subjective: Patient seen and examined at bedside. Patient awake and alert, doing much better. Off BiPAP on [place] at 5L . Overnight patient remained hypotensive - had some boluses with good UOP. An A-line placed due to inaccurate readings.   Assessment & Plan: Severe Sepsis 2/2 to UTI - urine culture gram negative rods, High risk for ESBL  Continue Merrem  Follow up urine and blood cultures  Blood pressures stable with MAP above 65  Severe lactic acidemia - 2/2 infection and respiratory hypoxia - resolved Improved 6L boluses and continues IVF  Will decrease IVF rate given hx of CHF - so far good UOP  Acute respiratory failure with hypercapnia and hypoxia - 2/2 to sepsis and possible obesity hypoventilation syndrome Large PE ruled out  Continue CPAP qHS  Oxygen supplementation as needed - keep sats above 89%   Acute toxic  encephalopathy - infectious etiology - resolved   Chronic left lower wound  - non healing - 2/2 to PVD vs Venous insufficiency  Wound care consult appreciated  Xeroform with Kerlix  Acute on CKD stage III - prerenal - Cr baseline 1.2 Responding to IVF  Monitor Cr   DM type 2 - stable, A1C 7.2 (06/26/2016)  SSI  Check CBG's  Resume diet   Chronic diastolic CHF - ECHO on [id number] EF 60-65% G1DD No signs of fluid overload  BP soft holding Lasix  Monitor for fluid overload as patient on IVF   DVT prophylaxis: Lovenox  Code  Status: FULL  Family Communication: None at bedside Disposition Plan: Back to previous environment when medically stable    Consultants:   None   Procedures:   A line placed 11/12/16  Antimicrobials:  Zosyn 11/12/16 OTO  Merrem 11/12/16  Vancomycin 11/12/16   Objective: Vitals:   11/13/16 0700 11/13/16 0800 11/13/16 0846 11/13/16 1200  BP:      Pulse:      Resp: (!) 0     Temp:  98.4 F (36.9 C)  98.6 F (37 C)  TempSrc:  Oral  Oral  SpO2: 94%  96%   Weight:      Height:        Intake/Output Summary (Last 24 hours) at 11/13/16 1333 Last data filed at 11/13/16 0200  Gross per 24 hour  Intake          3870.83 ml  Output              900 ml  Net          2970.83 ml   Filed Weights   11/12/16 1400  Weight: (!) 136.3 kg (300 lb 7.8 oz)    Examination:  General exam: NAD - slow speech  HEENT: AC/AT, PERRLA, OP moist and clear Respiratory system: Clear to auscultation. No wheezes,crackle or rhonchi Cardiovascular system: S1 & S2 heard, RRR. No JVD, murmurs, rubs or gallops Gastrointestinal system: Abdomen is nondistended, soft and nontender. Normal bowel sounds heard. Central nervous  system: Alert and oriented. No focal neurological deficits. Extremities: Left LE with chronic wound light yellow exudate, R BKA Skin: No rashes  Data Reviewed: I have personally reviewed following labs and imaging studies  CBC:  Recent Labs Lab 11/12/16 0200 11/12/16 1515 11/13/16 0355  WBC 15.3* 19.9* 18.4*  NEUTROABS 13.2* 18.8* 16.7*  HGB 12.5 13.6 9.9*  HCT 40.6 42.7 32.3*  MCV 89.8 89.0 90.5  PLT 231 180 0000000   Basic Metabolic Panel:  Recent Labs Lab 11/12/16 0200 11/12/16 1515 11/13/16 0355  NA 139 142 143  K 4.6 4.5 3.9  CL 93* 102 107  CO2 37* 31 29  GLUCOSE 192* 113* 102*  BUN 41* QUANTITY NOT SUFFICIENT, UNABLE TO PERFORM TEST 33*  CREATININE 1.58* QUANTITY NOT SUFFICIENT, UNABLE TO PERFORM TEST 1.49*  CALCIUM 9.4 8.6* 7.8*  MG  --   --  1.8  PHOS   --   --  2.8   GFR: Estimated Creatinine Clearance: 40.9 mL/min (by C-G formula based on SCr of 1.49 mg/dL (H)). Liver Function Tests:  Recent Labs Lab 11/12/16 0200 11/12/16 1515 11/13/16 0355  AST 36 QUANTITY NOT SUFFICIENT, UNABLE TO PERFORM TEST 20  ALT 31 25 21   ALKPHOS 170* 118 88  BILITOT 0.8 QUANTITY NOT SUFFICIENT, UNABLE TO PERFORM TEST 1.0  PROT 8.9* QUANTITY NOT SUFFICIENT, UNABLE TO PERFORM TEST 6.7  ALBUMIN 3.1* QUANTITY NOT SUFFICIENT, UNABLE TO PERFORM TEST 2.1*   No results for input(s): LIPASE, AMYLASE in the last 168 hours.  Recent Labs Lab 11/12/16 0747 11/13/16 0355  AMMONIA 45* 21   Coagulation Profile:  Recent Labs Lab 11/12/16 0200 11/12/16 1515  INR 1.06 1.12   Cardiac Enzymes:  Recent Labs Lab 11/13/16 0355  CKTOTAL 79   BNP (last 3 results) No results for input(s): PROBNP in the last 8760 hours. HbA1C: No results for input(s): HGBA1C in the last 72 hours. CBG:  Recent Labs Lab 11/12/16 2035 11/12/16 2347 11/13/16 0331 11/13/16 0720 11/13/16 1114  GLUCAP 86 99 91 84 79   Lipid Profile: No results for input(s): CHOL, HDL, LDLCALC, TRIG, CHOLHDL, LDLDIRECT in the last 72 hours. Thyroid Function Tests: No results for input(s): TSH, T4TOTAL, FREET4, T3FREE, THYROIDAB in the last 72 hours. Anemia Panel: No results for input(s): VITAMINB12, FOLATE, FERRITIN, TIBC, IRON, RETICCTPCT in the last 72 hours. Sepsis Labs:  Recent Labs Lab 11/12/16 0747 11/12/16 1233 11/12/16 1514 11/12/16 1515 11/13/16 0355  PROCALCITON  --   --   --  12.76  --   LATICACIDVEN 3.2* 2.5* 2.3*  --  0.9    Recent Results (from the past 240 hour(s))  Culture, blood (Routine x 2)     Status: None (Preliminary result)   Collection Time: 11/12/16  2:00 AM  Result Value Ref Range Status   Specimen Description BLOOD LEFT ANTECUBITAL  Final   Special Requests BOTTLES DRAWN AEROBIC AND ANAEROBIC 5ML  Final   Culture   Final    NO GROWTH < 12  HOURS Performed at [facility], [place] [address] [place], [place] [address]    Report Status PENDING  Incomplete  Urine culture     Status: Abnormal (Preliminary result)   Collection Time: 11/12/16  2:29 AM  Result Value Ref Range Status   Specimen Description URINE, RANDOM  Final   Special Requests NONE  Final   Culture >=100,000 COLONIES/mL GRAM NEGATIVE RODS (A)  Final   Report Status PENDING  Incomplete  Culture, blood (Routine  x 2)     Status: None (Preliminary result)   Collection Time: 11/12/16  2:30 AM  Result Value Ref Range Status   Specimen Description BLOOD RIGHT SHOULDER  Final   Special Requests IN PEDIATRIC BOTTLE 3ML  Final   Culture   Final    NO GROWTH < 12 HOURS Performed at [facility], [place] [address] [place], [place] [address]    Report Status PENDING  Incomplete  MRSA PCR Screening     Status: Abnormal   Collection Time: 11/12/16  2:20 PM  Result Value Ref Range Status   MRSA by PCR POSITIVE (A) NEGATIVE Final    Comment:        The GeneXpert MRSA Assay (FDA approved for NASAL specimens only), is one component of a comprehensive MRSA colonization surveillance program. It is not intended to diagnose MRSA infection nor to guide or monitor treatment for MRSA infections. RESULT CALLED TO, READ BACK BY AND VERIFIED WITH: [place] RN AT 1823 ON 11/12/16 BY S.VANHOORNE      Radiology Studies: Dg Chest 2 View  Result Date: 11/12/2016 CLINICAL DATA:  Shortness of breath.  Fever. EXAM: CHEST  2 VIEW COMPARISON:  Most recent radiographs 09/23/2016 FINDINGS: Improved right lung base opacity without definite residual. No new focal airspace disease. Slight decrease in cardiomegaly. Again seen vascular congestion. Probable perihilar edema. No pleural fluid. Unchanged osseous structures with chronic changes about shoulders. IMPRESSION: 1. Persistent vascular congestion and mild perihilar edema. Slight decreased cardiomegaly. 2. Improved right lung base  opacity.  No new consolidation. Electronically Signed   By: Jeb Levering M.D.   On: 11/12/2016 03:01   Dg Tibia/fibula Left  Result Date: 11/12/2016 CLINICAL DATA:  Fever arm.  Diabetic ulcer. EXAM: LEFT TIBIA AND FIBULA - 2 VIEW COMPARISON:  Tibia/fibula radiographs 06/20/2014 FINDINGS: Tibia and fibula are intact. No bony destructive change to suggest osteomyelitis. Chronic smooth periosteal thickening of the fibula appears chronic and unchanged and may be related to venous stasis. Soft tissue calcifications about the anterior lateral calf again seen, unchanged. Scattered phleboliths. IMPRESSION: No acute osseous abnormality or radiographic evidence of osteomyelitis. Stable soft tissue calcifications. Electronically Signed   By: Jeb Levering M.D.   On: 11/12/2016 06:11   Ct Head Wo Contrast  Result Date: 11/12/2016 CLINICAL DATA:  Acute encephalopathy. Altered mental status. Fever. EXAM: CT HEAD WITHOUT CONTRAST TECHNIQUE: Contiguous axial images were obtained from the base of the skull through the vertex without intravenous contrast. COMPARISON:  Head CT 07/07/2016 FINDINGS: Brain: No evidence of acute infarction, hemorrhage, hydrocephalus, extra-axial collection or mass lesion/mass effect. Mild motion artifact. Vascular: Atherosclerosis of skullbase vasculature without hyperdense vessel or abnormal calcification. Skull: Normal. Negative for fracture or focal lesion. Sinuses/Orbits: Remote right orbital fracture. Chronic right maxillary sinus opacification. Other: None. IMPRESSION: No acute intracranial abnormality. Electronically Signed   By: Jeb Levering M.D.   On: 11/12/2016 06:29   Ct Angio Chest Pe W Or Wo Contrast  Result Date: 11/12/2016 CLINICAL DATA:  Sepsis, hypercarbia, acute encephalopathy. Probable UTI. EXAM: CT ANGIOGRAPHY CHEST WITH CONTRAST TECHNIQUE: Multidetector CT imaging of the chest was performed using the standard protocol during bolus administration of intravenous  contrast. Multiplanar CT image reconstructions and MIPs were obtained to evaluate the vascular anatomy. CONTRAST:  80 mL Isovue 370 intravenous COMPARISON:  Radiographs 11/12/2016 FINDINGS: Cardiovascular: Suboptimal opacification of the pulmonary vasculature for exclusion of small pulmonary emboli. No large central emboli are present. Mild cardiomegaly. No pericardial effusion. Thoracic  aorta is normal in caliber and intact. Mediastinum/Nodes: Moderately prominent nodes throughout the mediastinum and hila, nonspecific. Lungs/Pleura: Focal thickening of the minor fissure measuring 5 x 11 mm. No airspace consolidation. No pleural effusions. Airways are patent. Upper Abdomen: No acute abnormality. Musculoskeletal: No significant skeletal lesion. Review of the MIP images confirms the above findings. IMPRESSION: 1. No large central pulmonary embolus. Suboptimal evaluation for exclusion of small emboli. 2. Nonspecific mediastinal and hilar nodes. 3. No airspace consolidation.  No effusions. Electronically Signed   By: Andreas [place] M.D.   On: 11/12/2016 19:21   Dg Chest Port 1 View  Result Date: 11/13/2016 CLINICAL DATA:  Hypoxia. EXAM: PORTABLE CHEST 1 VIEW COMPARISON:  CT 11/12/2016.  Chest x-ray 11/12/2016 . FINDINGS: Cardiomegaly with pulmonary vascular prominence and mild bilateral interstitial prominence consistent with mild CHF. No pneumothorax. No acute bony abnormality. IMPRESSION: Cardiomegaly with pulmonary vascular prominence and bilateral mild interstitial prominence. Mild CHF cannot be excluded. Electronically Signed   By: Marcello Moores  Register   On: 11/13/2016 07:57   Dg Foot 2 Views Left  Result Date: 11/12/2016 CLINICAL DATA:  Fever.  Diabetic foot ulcer. EXAM: LEFT FOOT - 2 VIEW COMPARISON:  None. FINDINGS: No bony destructive change to suggest osteomyelitis. There is pes planus with loss of normal Boehler's angle. Mild hammertoe deformity of the digits. Mild scattered osteoarthritis. Mild soft  tissue edema. Site of clinical ulcers not well seen radiographically. IMPRESSION: No radiographic evidence of osteomyelitis. Pes planus and scattered osteoarthritis. Electronically Signed   By: Jeb Levering M.D.   On: 11/12/2016 06:13      Scheduled Meds: . acetaminophen  1,000 mg Intravenous Q6H  . budesonide (PULMICORT) nebulizer solution  0.25 mg Nebulization BID  . Chlorhexidine Gluconate Cloth  6 each Topical Q0600  . enoxaparin (LOVENOX) injection  60 mg Subcutaneous Q24H  . fluticasone  2 spray Each Nare QHS  . insulin aspart  0-9 Units Subcutaneous Q4H  . ipratropium-albuterol  3 mL Nebulization Q6H  . meropenem (MERREM) IV  1 g Intravenous Q12H  . mupirocin ointment  1 application Nasal BID  . vancomycin  1,500 mg Intravenous Q24H   Continuous Infusions: . sodium chloride       LOS: 1 day    Chipper Oman, MD [facility] Pager [phone]  If 7PM-7AM, please contact night-coverage [email or web address] Password [email or web address] 11/13/2016, 1:33 PM

## 2016-11-14 ENCOUNTER — Inpatient Hospital Stay (HOSPITAL_COMMUNITY): Payer: Medicare Other

## 2016-11-14 DIAGNOSIS — N179 Acute kidney failure, unspecified: Secondary | ICD-10-CM

## 2016-11-14 DIAGNOSIS — E119 Type 2 diabetes mellitus without complications: Secondary | ICD-10-CM

## 2016-11-14 DIAGNOSIS — I5032 Chronic diastolic (congestive) heart failure: Secondary | ICD-10-CM

## 2016-11-14 LAB — CBC WITH DIFFERENTIAL/PLATELET
BASOS ABS: 0 10*3/uL (ref 0.0–0.1)
BASOS PCT: 0 %
EOS PCT: 2 %
Eosinophils Absolute: 0.3 10*3/uL (ref 0.0–0.7)
HCT: 30.7 % — ABNORMAL LOW (ref 36.0–46.0)
Hemoglobin: 9.4 g/dL — ABNORMAL LOW (ref 12.0–15.0)
LYMPHS PCT: 9 %
Lymphs Abs: 1.1 10*3/uL (ref 0.7–4.0)
MCH: 27.8 pg (ref 26.0–34.0)
MCHC: 30.6 g/dL (ref 30.0–36.0)
MCV: 90.8 fL (ref 78.0–100.0)
MONO ABS: 0.6 10*3/uL (ref 0.1–1.0)
Monocytes Relative: 5 %
NEUTROS ABS: 9.9 10*3/uL — AB (ref 1.7–7.7)
Neutrophils Relative %: 84 %
PLATELETS: 162 10*3/uL (ref 150–400)
RBC: 3.38 MIL/uL — ABNORMAL LOW (ref 3.87–5.11)
RDW: 18.1 % — AB (ref 11.5–15.5)
WBC: 11.9 10*3/uL — AB (ref 4.0–10.5)

## 2016-11-14 LAB — BASIC METABOLIC PANEL
ANION GAP: 4 — AB (ref 5–15)
BUN: 25 mg/dL — ABNORMAL HIGH (ref 6–20)
CALCIUM: 8 mg/dL — AB (ref 8.9–10.3)
CO2: 29 mmol/L (ref 22–32)
Chloride: 113 mmol/L — ABNORMAL HIGH (ref 101–111)
Creatinine, Ser: 1.19 mg/dL — ABNORMAL HIGH (ref 0.44–1.00)
GFR calc Af Amer: 52 mL/min — ABNORMAL LOW (ref >60)
GFR, EST NON AFRICAN AMERICAN: 45 mL/min — AB (ref >60)
GLUCOSE: 127 mg/dL — AB (ref 65–99)
POTASSIUM: 3.9 mmol/L (ref 3.5–5.1)
SODIUM: 146 mmol/L — AB (ref 135–145)

## 2016-11-14 LAB — GLUCOSE, CAPILLARY
GLUCOSE-CAPILLARY: 119 mg/dL — AB (ref 65–99)
GLUCOSE-CAPILLARY: 163 mg/dL — AB (ref 65–99)
GLUCOSE-CAPILLARY: 166 mg/dL — AB (ref 65–99)
Glucose-Capillary: 113 mg/dL — ABNORMAL HIGH (ref 65–99)
Glucose-Capillary: 114 mg/dL — ABNORMAL HIGH (ref 65–99)

## 2016-11-14 MED ORDER — NYSTATIN 100000 UNIT/GM EX POWD
Freq: Two times a day (BID) | CUTANEOUS | Status: DC
  Administered 2016-11-14 – 2016-11-16 (×6): via TOPICAL
  Administered 2016-11-17: 1 g via TOPICAL
  Administered 2016-11-17 – 2016-11-19 (×4): via TOPICAL
  Filled 2016-11-14: qty 15

## 2016-11-14 MED ORDER — IPRATROPIUM-ALBUTEROL 0.5-2.5 (3) MG/3ML IN SOLN
3.0000 mL | Freq: Three times a day (TID) | RESPIRATORY_TRACT | Status: DC
  Administered 2016-11-14 – 2016-11-16 (×7): 3 mL via RESPIRATORY_TRACT
  Filled 2016-11-14 (×7): qty 3

## 2016-11-14 NOTE — Progress Notes (Signed)
Pharmacy Antibiotic Note  Heather Zimmerman is a 71 y.o. female admitted on 11/12/2016 with sepsis 2/2 UTI. Pharmacy has been consulted for vancomycin and meropenem dosing due to concern for ESBL.    Urine culture growing E.coli (sensitivities pending).  MD wishes to continue vancomycin for now due to concern of left thigh cellulitis.   Plan: Continue meropenem 1g q12h and narrow as soon as urine culture sensitivities result. Continue vancomycin 1500 mg IV q24h. Plan to check a trough level in next 1-2 days if continued.  Height: 4\' 7"  (139.7 cm) Weight: (!) 300 lb 7.8 oz (136.3 kg) IBW/kg (Calculated) : 34  Temp (24hrs), Avg:98.7 F (37.1 C), Min:98.2 F (36.8 C), Max:99.1 F (37.3 C)   Recent Labs Lab 11/12/16 0200  11/12/16 0510 11/12/16 0747 11/12/16 1233 11/12/16 1514 11/12/16 1515 11/13/16 0355 11/14/16 0358  WBC 15.3*  --   --   --   --   --  19.9* 18.4* 11.9*  CREATININE 1.58*  --   --   --   --   --  QUANTITY NOT SUFFICIENT, UNABLE TO PERFORM TEST 1.49* 1.19*  LATICACIDVEN  --   < > 2.68* 3.2* 2.5* 2.3*  --  0.9  --   < > = values in this interval not displayed.  Estimated Creatinine Clearance: 51.3 mL/min (by C-G formula based on SCr of 1.19 mg/dL (H)).    Allergies  Allergen Reactions  . Ace Inhibitors     unknown    Antimicrobials this admission: 1/30 zosyn >> 1/30 1/30 vancomycin >>  1/30 Meropenem >>   Dose adjustments this admission:   Microbiology results: 1/30 BCx: ngtd 1/30 UCx:  >100K E.coli (sens pending) 1/30 Flu PCR: neg 1/30 MRSA screen: positive  Thank you for allowing pharmacy to be a part of this patient's care.  Hershal Coria 11/14/2016 1:09 PM

## 2016-11-14 NOTE — Progress Notes (Signed)
LCSWA met with patient at bedside and provided bed offers. Patient reports she is not sure if she wants to go to another facility. Patient states, she does not like the new management at current SNF and feels going to a new SNF might help. Patient reports she thought about going to a SNF in [place]. She reports she has family there but does not know if family is willing to pay for her trip to [place] by non emergency-EMS.  Patient reports she is going to look up facilities on her phone and decide if she wants to go to another facility.  LCSWA will continue to su[pport/ assist patient with SNF placement.   Kathrin Greathouse, Latanya Presser, MSW Clinical Social Worker 5E and Psychiatric Service Line [phone] 11/14/2016  1:26 PM

## 2016-11-14 NOTE — Progress Notes (Signed)
PROGRESS NOTE [employer]. Heather Zimmerman   [id number] DOB: [birth year]  DOA: 11/12/2016 PCP: Gildardo Cranker, DO   Brief Narrative:  Heather Zimmerman. Degraff is a 71 y.o. female with history of diastolic CHF, noncompliant with CPAP for sleep apnea, diabetes mellitus, peripheral vascular disease status post right BKA, morbid obesity was brought to the ER after patient was found to be confused and having fever and chills. Patient admitted for sepsis from UTI, Acute respiratory failure and hypotension. Started on IVF, IV abx and BiPAP.   Subjective: Improving, blood pressure more stable, she is now much more alert and oriented x3, no fever, on 2liter oxygen ( on 2liter chronically), on cpap at night chronically She report left thigh being swallen.   Assessment & Plan:  Severe Sepsis 2/2 to UTI - sepsis presented on admission with  fever 102.6, hypotension, leukocytosis and lactic acidosis  blood cultures no growth, urine culture+ ecoli Improving, lactic acidosis normalized, wbc improving, bp better, fever resolved Continue Merrem to empirically cover ESBL Will continue vanc until rule out left thigh infection due to complain of left thigh pain with h/o left thigh infection   Acute  On chronic respiratory failure with hypercapnia and hypoxia - 2/2 to sepsis and possible obesity hypoventilation syndrome CTA chest negative for Large PE  Continue CPAP qHS  Improved , now back on baseline 2liter o2, continue qhs cpap  Acute toxic  encephalopathy - infectious etiology - resolved , now aaox3  Left thigh pain/edeam: She report left thigh is increasing in size and has pain. On exam Medial left thigh hard and tender to touch,no open wound  With h/o left thigh infection, Will get soft tissue US, continue vanc for now  Chronic left lower leg wound  - non healing - 2/2 to PVD vs Venous insufficiency  Wound care consult appreciated  Xeroform with Kerlix  Acute on CKD stage III - prerenal -  Cr baseline 1.2 Improved, cr back to baseline with hydration and treating infection Cr 1.58 on admission, cr 1.19 on 2/1, d/c ivf  Insulin dependent DM type 2 - stable, A1C 7.2 (06/26/2016)  Has hypoglycemia even when she was kept on npo due to encephalopahty, home med lantus held since admission She is on SSI and hypoglycemia protocol Mental status improving, resume diet, anticipate restart lantus soon    Chronic diastolic CHF - ECHO on Q000111Q EF 60-65% G1DD Home meds demadex held since admission due to sepsis/hypotension and aki She received ivf, now improving, d/c ivf, anticipate restart diuretics on 2/2 if her blood pressure and cr continue to be stable,  Close monitor volume status  Yeast under large abdominal panniculus Topical nystatin, skin care   Morbid obesity, osa on cpap Body mass index is 69.84 kg/m.  H/o right aka, baseline wheelchair to bed bound.    DVT prophylaxis: Lovenox  Code Status: FULL  Family Communication: None at bedside Disposition Plan:[facility] a [facility]   Consultants:   None   Procedures:   A line placed 11/12/16  Antimicrobials:  Zosyn 11/12/16 OTO  Merrem 11/12/16  Vancomycin 11/12/16   Objective: Vitals:   11/14/16 0400 11/14/16 0500 11/14/16 0516 11/14/16 0800  BP:    (!) 123/43  Pulse:      Resp: 12 13    Temp:   98.9 F (37.2 C)   TempSrc:   Axillary   SpO2: 92% 95%    Weight:      Height:  Intake/Output Summary (Last 24 hours) at 11/14/16 0818 Last data filed at 11/14/16 0300  Gross per 24 hour  Intake          1123.75 ml  Output                0 ml  Net          1123.75 ml   Filed Weights   11/12/16 1400  Weight: (!) 136.3 kg (300 lb 7.8 oz)    Examination:  General exam: obese, chronically ill, alert and oriented HEENT: AC/AT, PERRLA, OP moist and clear Respiratory system: Clear to auscultation. No wheezes,crackle or rhonchi Cardiovascular system: S1 & S2 heard, RRR. No JVD, murmurs,  rubs or gallops Gastrointestinal system: Abdomen is nondistended, soft and nontender. Normal bowel sounds heard. Large abdominal panniculus Central nervous system: Alert and oriented. No focal neurological deficits. Extremities: medial  left thigh hard and  Tender to touch,  Left lower leg with chronic wound light yellow exudate, R BKA Skin: No rashes  Data Reviewed: I have personally reviewed following labs and imaging studies  CBC:  Recent Labs Lab 11/12/16 0200 11/12/16 1515 11/13/16 0355 11/14/16 0358  WBC 15.3* 19.9* 18.4* 11.9*  NEUTROABS 13.2* 18.8* 16.7* 9.9*  HGB 12.5 13.6 9.9* 9.4*  HCT 40.6 42.7 32.3* 30.7*  MCV 89.8 89.0 90.5 90.8  PLT 231 180 160 0000000   Basic Metabolic Panel:  Recent Labs Lab 11/12/16 0200 11/12/16 1515 11/13/16 0355 11/14/16 0358  NA 139 142 143 146*  K 4.6 4.5 3.9 3.9  CL 93* 102 107 113*  CO2 37* 31 29 29   GLUCOSE 192* 113* 102* 127*  BUN 41* QUANTITY NOT SUFFICIENT, UNABLE TO PERFORM TEST 33* 25*  CREATININE 1.58* QUANTITY NOT SUFFICIENT, UNABLE TO PERFORM TEST 1.49* 1.19*  CALCIUM 9.4 8.6* 7.8* 8.0*  MG  --   --  1.8  --   PHOS  --   --  2.8  --    GFR: Estimated Creatinine Clearance: 51.3 mL/min (by C-G formula based on SCr of 1.19 mg/dL (H)). Liver Function Tests:  Recent Labs Lab 11/12/16 0200 11/12/16 1515 11/13/16 0355  AST 36 QUANTITY NOT SUFFICIENT, UNABLE TO PERFORM TEST 20  ALT 31 25 21   ALKPHOS 170* 118 88  BILITOT 0.8 QUANTITY NOT SUFFICIENT, UNABLE TO PERFORM TEST 1.0  PROT 8.9* QUANTITY NOT SUFFICIENT, UNABLE TO PERFORM TEST 6.7  ALBUMIN 3.1* QUANTITY NOT SUFFICIENT, UNABLE TO PERFORM TEST 2.1*   No results for input(s): LIPASE, AMYLASE in the last 168 hours.  Recent Labs Lab 11/12/16 0747 11/13/16 0355  AMMONIA 45* 21   Coagulation Profile:  Recent Labs Lab 11/12/16 0200 11/12/16 1515  INR 1.06 1.12   Cardiac Enzymes:  Recent Labs Lab 11/13/16 0355  CKTOTAL 79   BNP (last 3 results) No  results for input(s): PROBNP in the last 8760 hours. HbA1C: No results for input(s): HGBA1C in the last 72 hours. CBG:  Recent Labs Lab 11/13/16 1719 11/13/16 1950 11/13/16 2210 11/14/16 0024 11/14/16 0501  GLUCAP 88 65 109* 114* 119*   Lipid Profile: No results for input(s): CHOL, HDL, LDLCALC, TRIG, CHOLHDL, LDLDIRECT in the last 72 hours. Thyroid Function Tests: No results for input(s): TSH, T4TOTAL, FREET4, T3FREE, THYROIDAB in the last 72 hours. Anemia Panel: No results for input(s): VITAMINB12, FOLATE, FERRITIN, TIBC, IRON, RETICCTPCT in the last 72 hours. Sepsis Labs:  Recent Labs Lab 11/12/16 0747 11/12/16 1233 11/12/16 1514 11/12/16 1515 11/13/16 0355  PROCALCITON  --   --   --  12.76  --   LATICACIDVEN 3.2* 2.5* 2.3*  --  0.9    Recent Results (from the past 240 hour(s))  Culture, blood (Routine x 2)     Status: None (Preliminary result)   Collection Time: 11/12/16  2:00 AM  Result Value Ref Range Status   Specimen Description BLOOD LEFT ANTECUBITAL  Final   Special Requests BOTTLES DRAWN AEROBIC AND ANAEROBIC 5ML  Final   Culture   Final    NO GROWTH 1 DAY Performed at [facility], [place] [address] [place], [place] [address]    Report Status PENDING  Incomplete  Urine culture     Status: Abnormal (Preliminary result)   Collection Time: 11/12/16  2:29 AM  Result Value Ref Range Status   Specimen Description URINE, RANDOM  Final   Special Requests NONE  Final   Culture (A)  Final    >=100,000 COLONIES/mL ESCHERICHIA COLI SUSCEPTIBILITIES TO FOLLOW Performed at [facility], [place] [address], [place], [place] [address]    Report Status PENDING  Incomplete  Culture, blood (Routine x 2)     Status: None (Preliminary result)   Collection Time: 11/12/16  2:30 AM  Result Value Ref Range Status   Specimen Description BLOOD RIGHT SHOULDER  Final   Special Requests IN PEDIATRIC BOTTLE 3ML  Final   Culture   Final    NO GROWTH 1 DAY Performed at  [facility], [place] [address] [place], [place] [address]    Report Status PENDING  Incomplete  MRSA PCR Screening     Status: Abnormal   Collection Time: 11/12/16  2:20 PM  Result Value Ref Range Status   MRSA by PCR POSITIVE (A) NEGATIVE Final    Comment:        The GeneXpert MRSA Assay (FDA approved for NASAL specimens only), is one component of a comprehensive MRSA colonization surveillance program. It is not intended to diagnose MRSA infection nor to guide or monitor treatment for MRSA infections. RESULT CALLED TO, READ BACK BY AND VERIFIED WITH: SDeirdre Priest RN AT 1823 ON 11/12/16 BY S.VANHOORNE      Radiology Studies: Ct Angio Chest Pe W Or Wo Contrast  Result Date: 11/12/2016 CLINICAL DATA:  Sepsis, hypercarbia, acute encephalopathy. Probable UTI. EXAM: CT ANGIOGRAPHY CHEST WITH CONTRAST TECHNIQUE: Multidetector CT imaging of the chest was performed using the standard protocol during bolus administration of intravenous contrast. Multiplanar CT image reconstructions and MIPs were obtained to evaluate the vascular anatomy. CONTRAST:  80 mL Isovue 370 intravenous COMPARISON:  Radiographs 11/12/2016 FINDINGS: Cardiovascular: Suboptimal opacification of the pulmonary vasculature for exclusion of small pulmonary emboli. No large central emboli are present. Mild cardiomegaly. No pericardial effusion. Thoracic aorta is normal in caliber and intact. Mediastinum/Nodes: Moderately prominent nodes throughout the mediastinum and hila, nonspecific. Lungs/Pleura: Focal thickening of the minor fissure measuring 5 x 11 mm. No airspace consolidation. No pleural effusions. Airways are patent. Upper Abdomen: No acute abnormality. Musculoskeletal: No significant skeletal lesion. Review of the MIP images confirms the above findings. IMPRESSION: 1. No large central pulmonary embolus. Suboptimal evaluation for exclusion of small emboli. 2. Nonspecific mediastinal and hilar nodes. 3. No airspace  consolidation.  No effusions. Electronically Signed   By: Andreas Newport M.D.   On: 11/12/2016 19:21   Dg Chest Port 1 View  Result Date: 11/13/2016 CLINICAL DATA:  Hypoxia. EXAM: PORTABLE CHEST 1 VIEW COMPARISON:  CT 11/12/2016.  Chest x-ray 11/12/2016 . FINDINGS: Cardiomegaly with pulmonary vascular  prominence and mild bilateral interstitial prominence consistent with mild CHF. No pneumothorax. No acute bony abnormality. IMPRESSION: Cardiomegaly with pulmonary vascular prominence and bilateral mild interstitial prominence. Mild CHF cannot be excluded. Electronically Signed   By: Marcello Moores  Register   On: 11/13/2016 07:57      Scheduled Meds: . budesonide (PULMICORT) nebulizer solution  0.25 mg Nebulization BID  . Chlorhexidine Gluconate Cloth  6 each Topical Q0600  . enoxaparin (LOVENOX) injection  60 mg Subcutaneous Q24H  . fluticasone  2 spray Each Nare QHS  . insulin aspart  0-9 Units Subcutaneous Q4H  . ipratropium-albuterol  3 mL Nebulization TID  . meropenem (MERREM) IV  1 g Intravenous Q12H  . mupirocin ointment  1 application Nasal BID  . vancomycin  1,500 mg Intravenous Q24H   Continuous Infusions:    LOS: 2 days   Time spent > 41mins  Marcelline Temkin, MD PhD [facility] Pager [phone]  If 7PM-7AM, please contact night-coverage [email or web address] Password [facility] 11/14/2016, 8:18 AM

## 2016-11-15 ENCOUNTER — Encounter (HOSPITAL_COMMUNITY): Payer: Self-pay

## 2016-11-15 DIAGNOSIS — Z89611 Acquired absence of right leg above knee: Secondary | ICD-10-CM

## 2016-11-15 LAB — BASIC METABOLIC PANEL
Anion gap: 5 (ref 5–15)
BUN: 16 mg/dL (ref 6–20)
CHLORIDE: 111 mmol/L (ref 101–111)
CO2: 28 mmol/L (ref 22–32)
CREATININE: 1.1 mg/dL — AB (ref 0.44–1.00)
Calcium: 8.5 mg/dL — ABNORMAL LOW (ref 8.9–10.3)
GFR calc non Af Amer: 49 mL/min — ABNORMAL LOW (ref >60)
GFR, EST AFRICAN AMERICAN: 57 mL/min — AB (ref >60)
Glucose, Bld: 156 mg/dL — ABNORMAL HIGH (ref 65–99)
POTASSIUM: 4.1 mmol/L (ref 3.5–5.1)
SODIUM: 144 mmol/L (ref 135–145)

## 2016-11-15 LAB — GLUCOSE, CAPILLARY
GLUCOSE-CAPILLARY: 148 mg/dL — AB (ref 65–99)
GLUCOSE-CAPILLARY: 150 mg/dL — AB (ref 65–99)
GLUCOSE-CAPILLARY: 170 mg/dL — AB (ref 65–99)
GLUCOSE-CAPILLARY: 182 mg/dL — AB (ref 65–99)
Glucose-Capillary: 117 mg/dL — ABNORMAL HIGH (ref 65–99)
Glucose-Capillary: 133 mg/dL — ABNORMAL HIGH (ref 65–99)
Glucose-Capillary: 163 mg/dL — ABNORMAL HIGH (ref 65–99)

## 2016-11-15 LAB — CBC WITH DIFFERENTIAL/PLATELET
BASOS PCT: 0 %
Basophils Absolute: 0 10*3/uL (ref 0.0–0.1)
Eosinophils Absolute: 0.2 10*3/uL (ref 0.0–0.7)
Eosinophils Relative: 4 %
HEMATOCRIT: 28.7 % — AB (ref 36.0–46.0)
Hemoglobin: 8.8 g/dL — ABNORMAL LOW (ref 12.0–15.0)
LYMPHS ABS: 1.3 10*3/uL (ref 0.7–4.0)
Lymphocytes Relative: 20 %
MCH: 27.7 pg (ref 26.0–34.0)
MCHC: 30.7 g/dL (ref 30.0–36.0)
MCV: 90.3 fL (ref 78.0–100.0)
MONOS PCT: 8 %
Monocytes Absolute: 0.5 10*3/uL (ref 0.1–1.0)
NEUTROS ABS: 4.2 10*3/uL (ref 1.7–7.7)
NEUTROS PCT: 68 %
Platelets: 150 10*3/uL (ref 150–400)
RBC: 3.18 MIL/uL — AB (ref 3.87–5.11)
RDW: 17.8 % — ABNORMAL HIGH (ref 11.5–15.5)
WBC: 6.2 10*3/uL (ref 4.0–10.5)

## 2016-11-15 LAB — URINE CULTURE: Culture: >=100000 — AB

## 2016-11-15 MED ORDER — ACETAMINOPHEN 325 MG PO TABS
650.0000 mg | ORAL_TABLET | Freq: Four times a day (QID) | ORAL | Status: DC | PRN
  Administered 2016-11-15 – 2016-11-17 (×2): 650 mg via ORAL
  Filled 2016-11-15 (×2): qty 2

## 2016-11-15 MED ORDER — FUROSEMIDE 10 MG/ML IJ SOLN
40.0000 mg | Freq: Every day | INTRAMUSCULAR | Status: DC
  Administered 2016-11-15 – 2016-11-18 (×4): 40 mg via INTRAVENOUS
  Filled 2016-11-15 (×4): qty 4

## 2016-11-15 MED ORDER — INSULIN GLARGINE 100 UNIT/ML ~~LOC~~ SOLN
10.0000 [IU] | Freq: Every day | SUBCUTANEOUS | Status: DC
Start: 2016-11-15 — End: 2016-11-19
  Administered 2016-11-15 – 2016-11-18 (×4): 10 [IU] via SUBCUTANEOUS
  Filled 2016-11-15 (×5): qty 0.1

## 2016-11-15 MED ORDER — DOXYCYCLINE HYCLATE 100 MG PO TABS
100.0000 mg | ORAL_TABLET | Freq: Two times a day (BID) | ORAL | Status: DC
  Administered 2016-11-15 – 2016-11-19 (×9): 100 mg via ORAL
  Filled 2016-11-15 (×9): qty 1

## 2016-11-15 MED ORDER — CEPHALEXIN 500 MG PO CAPS
500.0000 mg | ORAL_CAPSULE | Freq: Two times a day (BID) | ORAL | Status: DC
  Administered 2016-11-15 – 2016-11-19 (×9): 500 mg via ORAL
  Filled 2016-11-15 (×10): qty 1

## 2016-11-15 MED ORDER — GABAPENTIN 300 MG PO CAPS
600.0000 mg | ORAL_CAPSULE | Freq: Two times a day (BID) | ORAL | Status: DC
  Administered 2016-11-15 – 2016-11-19 (×8): 600 mg via ORAL
  Filled 2016-11-15 (×8): qty 2

## 2016-11-15 NOTE — Procedures (Signed)
Placed patient on CPAP for the night.  Patient is tolerating well at this time. 

## 2016-11-15 NOTE — Progress Notes (Signed)
PROGRESS NOTE [employer]. [name]   VW:[id number] DOB: 1945-12-24  DOA: 11/12/2016 PCP: Gildardo Cranker, DO   Brief Narrative:  Heather Zimmerman. Heather Zimmerman is a 71 y.o. female with history of diastolic CHF, noncompliant with CPAP for sleep apnea, diabetes mellitus, peripheral vascular disease status post right BKA, morbid obesity was brought to the ER after patient was found to be confused and having fever and chills. Patient admitted for sepsis from UTI, Acute respiratory failure and hypotension. Started on IVF, IV abx and BiPAP.   Subjective: Improving, oriented x3, no fever, bp and heart rate stable, on 2liter oxygen ( on 2liter chronically), on cpap at night chronically She report left thigh being swollen/tender  Assessment & Plan:  Severe Sepsis 2/2 to UTI - sepsis presented on admission with  fever 102.6, hypotension, leukocytosis and lactic acidosis  blood cultures no growth, urine culture+ ecoli Improving, lactic acidosis and wbc normalized, fever resolved, bp and heart rate improved. She is started on  Merrem to empirically cover ESBL, urine culture resulted, abx changed to keflex according to sensitivity testing. She is stated on vanc to cover left thigh infection due to complain of left thigh pain with h/o left thigh infection. Changed to oral doxycycline with clinical improvement and soft tissue US to left thigh showed generalized edema, no fluids collection.   Acute  On chronic respiratory failure with hypercapnia and hypoxia - 2/2 to sepsis and possible obesity hypoventilation syndrome CTA chest negative for Large PE  Continue CPAP qHS  Improved , now back on baseline 2liter o2, continue qhs cpap  Acute toxic  encephalopathy - infectious etiology - resolved , now aaox3  Left thigh pain/edeam: She report left thigh is increasing in size and has pain. On exam Medial left thigh hard and tender to touch,no open wound  She is stated on vanc to cover left thigh  infection due to complain of left thigh pain with h/o left thigh infection. Changed to oral doxycycline with clinical improvement and soft tissue US to left thigh showed generalized edema, no fluids collection.  Chronic left lower leg wound  - non healing - 2/2 to PVD vs Venous insufficiency  Wound care consult appreciated  Xeroform with Kerlix  Acute on CKD stage III - prerenal - Cr baseline 1.2 Improved, cr back to baseline with hydration and treating infection. Cr 1.58 on admission, cr 1.10 on 2/1, off  ivf since 2/1  Insulin dependent DM type 2 - stable, A1C 7.2 (06/26/2016)  Has hypoglycemia even when she was kept on npo due to encephalopahty, home med lantus held since admission She is on SSI and hypoglycemia protocol Mental status improving, resume diet, anticipate restart lantus soon    Chronic diastolic CHF - ECHO on Q000111Q EF 60-65% G1DD Home meds demadex held since admission due to sepsis/hypotension and aki She received ivf, now improving, d/c ivf, anticipate restart diuretics on 2/2 if her blood pressure and cr continue to be stable,  Close monitor volume status  Yeast under large abdominal panniculus Topical nystatin, skin care   Morbid obesity, osa on cpap Body mass index is 69.84 kg/m.  H/o right aka, baseline wheelchair to bed bound.    DVT prophylaxis: Lovenox  Code Status: FULL  Family Communication: None at bedside Disposition Plan: will need lasix for another day or two, then [facility]    Consultants:   None   Procedures:   A line placed 11/12/16  Antimicrobials:  Zosyn 11/12/16 OTO  Merrem 11/12/16 to 2/2  Vancomycin 11/12/16 to 2/2   Objective: Vitals:   11/15/16 0700 11/15/16 0740 11/15/16 0800 11/15/16 0801  BP:  (!) 138/44    Pulse:      Resp: 15 17    Temp:  98.3 F (36.8 C)    TempSrc:  Oral    SpO2: 99% 100% 99% 100%  Weight:      Height:        Intake/Output Summary (Last 24 hours) at 11/15/16 0803 Last data  filed at 11/15/16 0730  Gross per 24 hour  Intake              600 ml  Output             1910 ml  Net            -1310 ml   Filed Weights   11/12/16 1400  Weight: (!) 136.3 kg (300 lb 7.8 oz)    Examination:  General exam: obese, chronically ill, alert and oriented HEENT: AC/AT, PERRLA, OP moist and clear Respiratory system: Clear to auscultation. No wheezes,crackle or rhonchi Cardiovascular system: S1 & S2 heard, RRR. No JVD, murmurs, rubs or gallops Gastrointestinal system: Abdomen is nondistended, soft and nontender. Normal bowel sounds heard. Large abdominal panniculus Central nervous system: Alert and oriented. No focal neurological deficits. Extremities: medial  left thigh hard and  Tender to touch,  Left lower leg with chronic wound light yellow exudate, R BKA Skin: No rashes  Data Reviewed: I have personally reviewed following labs and imaging studies  CBC:  Recent Labs Lab 11/12/16 0200 11/12/16 1515 11/13/16 0355 11/14/16 0358 11/15/16 0404  WBC 15.3* 19.9* 18.4* 11.9* 6.2  NEUTROABS 13.2* 18.8* 16.7* 9.9* 4.2  HGB 12.5 13.6 9.9* 9.4* 8.8*  HCT 40.6 42.7 32.3* 30.7* 28.7*  MCV 89.8 89.0 90.5 90.8 90.3  PLT 231 180 160 162 Q000111Q   Basic Metabolic Panel:  Recent Labs Lab 11/12/16 0200 11/12/16 1515 11/13/16 0355 11/14/16 0358 11/15/16 0404  NA 139 142 143 146* 144  K 4.6 4.5 3.9 3.9 4.1  CL 93* 102 107 113* 111  CO2 37* 31 29 29 28   GLUCOSE 192* 113* 102* 127* 156*  BUN 41* QUANTITY NOT SUFFICIENT, UNABLE TO PERFORM TEST 33* 25* 16  CREATININE 1.58* QUANTITY NOT SUFFICIENT, UNABLE TO PERFORM TEST 1.49* 1.19* 1.10*  CALCIUM 9.4 8.6* 7.8* 8.0* 8.5*  MG  --   --  1.8  --   --   PHOS  --   --  2.8  --   --    GFR: Estimated Creatinine Clearance: 55.5 mL/min (by C-G formula based on SCr of 1.1 mg/dL (H)). Liver Function Tests:  Recent Labs Lab 11/12/16 0200 11/12/16 1515 11/13/16 0355  AST 36 QUANTITY NOT SUFFICIENT, UNABLE TO PERFORM TEST 20    ALT 31 25 21   ALKPHOS 170* 118 88  BILITOT 0.8 QUANTITY NOT SUFFICIENT, UNABLE TO PERFORM TEST 1.0  PROT 8.9* QUANTITY NOT SUFFICIENT, UNABLE TO PERFORM TEST 6.7  ALBUMIN 3.1* QUANTITY NOT SUFFICIENT, UNABLE TO PERFORM TEST 2.1*   No results for input(s): LIPASE, AMYLASE in the last 168 hours.  Recent Labs Lab 11/12/16 0747 11/13/16 0355  AMMONIA 45* 21   Coagulation Profile:  Recent Labs Lab 11/12/16 0200 11/12/16 1515  INR 1.06 1.12   Cardiac Enzymes:  Recent Labs Lab 11/13/16 0355  CKTOTAL 79   BNP (last 3 results) No results for input(s): PROBNP in the last 8760 hours.  HbA1C: No results for input(s): HGBA1C in the last 72 hours. CBG:  Recent Labs Lab 11/14/16 1615 11/14/16 1936 11/14/16 2346 11/15/16 0341 11/15/16 0743  GLUCAP 166* 170* 182* 148* 117*   Lipid Profile: No results for input(s): CHOL, HDL, LDLCALC, TRIG, CHOLHDL, LDLDIRECT in the last 72 hours. Thyroid Function Tests: No results for input(s): TSH, T4TOTAL, FREET4, T3FREE, THYROIDAB in the last 72 hours. Anemia Panel: No results for input(s): VITAMINB12, FOLATE, FERRITIN, TIBC, IRON, RETICCTPCT in the last 72 hours. Sepsis Labs:  Recent Labs Lab 11/12/16 0747 11/12/16 1233 11/12/16 1514 11/12/16 1515 11/13/16 0355  PROCALCITON  --   --   --  12.76  --   LATICACIDVEN 3.2* 2.5* 2.3*  --  0.9    Recent Results (from the past 240 hour(s))  Culture, blood (Routine x 2)     Status: None (Preliminary result)   Collection Time: 11/12/16  2:00 AM  Result Value Ref Range Status   Specimen Description BLOOD LEFT ANTECUBITAL  Final   Special Requests BOTTLES DRAWN AEROBIC AND ANAEROBIC 5ML  Final   Culture   Final    NO GROWTH 2 DAYS Performed at [facility], [place] [address] [place], [place] [address]    Report Status PENDING  Incomplete  Urine culture     Status: Abnormal (Preliminary result)   Collection Time: 11/12/16  2:29 AM  Result Value Ref Range Status   Specimen  Description URINE, RANDOM  Final   Special Requests NONE  Final   Culture (A)  Final    >=100,000 COLONIES/mL ESCHERICHIA COLI REPEATING SENSITIVITIES Performed at [facility], [place] [address] [place], [place] [address]    Report Status PENDING  Incomplete  Culture, blood (Routine x 2)     Status: None (Preliminary result)   Collection Time: 11/12/16  2:30 AM  Result Value Ref Range Status   Specimen Description BLOOD RIGHT SHOULDER  Final   Special Requests IN PEDIATRIC BOTTLE 3ML  Final   Culture   Final    NO GROWTH 2 DAYS Performed at [facility], [place] [address] [place], [place] [address]    Report Status PENDING  Incomplete  MRSA PCR Screening     Status: Abnormal   Collection Time: 11/12/16  2:20 PM  Result Value Ref Range Status   MRSA by PCR POSITIVE (A) NEGATIVE Final    Comment:        The GeneXpert MRSA Assay (FDA approved for NASAL specimens only), is one component of a comprehensive MRSA colonization surveillance program. It is not intended to diagnose MRSA infection nor to guide or monitor treatment for MRSA infections. RESULT CALLED TO, READ BACK BY AND VERIFIED WITH: Recardo Evangelist RN AT 1823 ON 11/12/16 BY S.VANHOORNE      Radiology Studies: [place] Lt Lower Extrem Ltd Soft Tissue Non Vascular  Result Date: 11/14/2016 CLINICAL DATA:  Left thigh pain. EXAM: ULTRASOUND LEFT LOWER EXTREMITY LIMITED TECHNIQUE: Ultrasound examination of the lower extremity soft tissues was performed in the area of clinical concern. COMPARISON:  11/12/2016 . FINDINGS: Diffuse soft tissue edema. No focal definitive fluid collection or solid abnormality identified. IMPRESSION: Diffuse edema. No focal abnormality identified. Further evaluation with MRI can be obtained as needed . Electronically Signed   By: Marcello Moores  Register   On: 11/14/2016 13:50      Scheduled Meds: . budesonide (PULMICORT) nebulizer solution  0.25 mg Nebulization BID  . Chlorhexidine Gluconate Cloth  6  each Topical  Q0600  . enoxaparin (LOVENOX) injection  60 mg Subcutaneous Q24H  . fluticasone  2 spray Each Nare QHS  . insulin aspart  0-9 Units Subcutaneous Q4H  . ipratropium-albuterol  3 mL Nebulization TID  . meropenem (MERREM) IV  1 g Intravenous Q12H  . mupirocin ointment  1 application Nasal BID  . nystatin   Topical BID  . vancomycin  1,500 mg Intravenous Q24H   Continuous Infusions:    LOS: 3 days   Time spent > 78mins  Muzammil Bruins, MD PhD [facility] Pager [phone]  If 7PM-7AM, please contact night-coverage [email or web address] Password [email or web address] 11/15/2016, 8:03 AM

## 2016-11-15 NOTE — Consult Note (Signed)
   [facility] CM Inpatient Consult   11/15/2016  Heather Nora. Zimmerman 14-May-1946 [id number]    Patient screened for potential [facility] Care Management services. Chart reviewed. Noted current discharge plan is for SNF.  There are no identifiable [facility] Care Management needs at this time. If patient's post hospital needs change, please place a [facility] Care Management consult. For questions please contact:  Marthenia Rolling, [place], RN,BSN [facility] Liaison [phone]

## 2016-11-15 NOTE — Progress Notes (Signed)
Paged MD at 1500, about patient request for her gabapentin to be restarted. Will inform patient of communication with MD.

## 2016-11-16 LAB — BASIC METABOLIC PANEL
Anion gap: 6 (ref 5–15)
BUN: 11 mg/dL (ref 6–20)
CHLORIDE: 105 mmol/L (ref 101–111)
CO2: 31 mmol/L (ref 22–32)
CREATININE: 0.94 mg/dL (ref 0.44–1.00)
Calcium: 8.8 mg/dL — ABNORMAL LOW (ref 8.9–10.3)
GFR calc Af Amer: >60 mL/min (ref >60)
GFR calc non Af Amer: 60 mL/min — ABNORMAL LOW (ref >60)
Glucose, Bld: 123 mg/dL — ABNORMAL HIGH (ref 65–99)
Potassium: 3.7 mmol/L (ref 3.5–5.1)
Sodium: 142 mmol/L (ref 135–145)

## 2016-11-16 LAB — GLUCOSE, CAPILLARY
GLUCOSE-CAPILLARY: 112 mg/dL — AB (ref 65–99)
GLUCOSE-CAPILLARY: 133 mg/dL — AB (ref 65–99)
GLUCOSE-CAPILLARY: 152 mg/dL — AB (ref 65–99)
GLUCOSE-CAPILLARY: 123 mg/dL — AB (ref 65–99)
Glucose-Capillary: 135 mg/dL — ABNORMAL HIGH (ref 65–99)
Glucose-Capillary: 125 mg/dL — ABNORMAL HIGH (ref 65–99)

## 2016-11-16 LAB — CBC
HEMATOCRIT: 31.1 % — AB (ref 36.0–46.0)
HEMOGLOBIN: 9.8 g/dL — AB (ref 12.0–15.0)
MCH: 27.9 pg (ref 26.0–34.0)
MCHC: 31.5 g/dL (ref 30.0–36.0)
MCV: 88.6 fL (ref 78.0–100.0)
Platelets: 185 10*3/uL (ref 150–400)
RBC: 3.51 MIL/uL — ABNORMAL LOW (ref 3.87–5.11)
RDW: 16.8 % — ABNORMAL HIGH (ref 11.5–15.5)
WBC: 6.3 10*3/uL (ref 4.0–10.5)

## 2016-11-16 LAB — MAGNESIUM: Magnesium: 1.7 mg/dL (ref 1.7–2.4)

## 2016-11-16 MED ORDER — IPRATROPIUM-ALBUTEROL 0.5-2.5 (3) MG/3ML IN SOLN
3.0000 mL | Freq: Two times a day (BID) | RESPIRATORY_TRACT | Status: DC
  Administered 2016-11-16 – 2016-11-19 (×6): 3 mL via RESPIRATORY_TRACT
  Filled 2016-11-16 (×6): qty 3

## 2016-11-16 MED ORDER — ENOXAPARIN SODIUM 80 MG/0.8ML ~~LOC~~ SOLN
70.0000 mg | SUBCUTANEOUS | Status: DC
  Administered 2016-11-16 – 2016-11-18 (×3): 70 mg via SUBCUTANEOUS
  Filled 2016-11-16 (×3): qty 0.8

## 2016-11-16 NOTE — Progress Notes (Signed)
PROGRESS NOTE [name]   VW:[id number] DOB: 1946-06-11  DOA: 11/12/2016 PCP: Gildardo Cranker, DO   Brief Narrative:  Heather Zimmerman. Murnan is a 71 y.o. female with history of diastolic CHF, noncompliant with CPAP for sleep apnea, diabetes mellitus, peripheral vascular disease status post right BKA, morbid obesity was brought to the ER after patient was found to be confused and having fever and chills. Patient admitted for sepsis from UTI, Acute respiratory failure and hypotension. Started on IVF, IV abx and BiPAP.   Subjective: Improving, oriented x3, no fever, bp and heart rate stable, on 2liter oxygen ( on 2liter chronically), on cpap at night chronically She report left thigh being swollen/tender has improved with diuresis  Assessment & Plan:  Severe Sepsis 2/2 to UTI - sepsis presented on admission with  fever 102.6, hypotension ( required aline for accurate blood pressure reading initially at stepdown unit), leukocytosis and lactic acidosis  blood cultures no growth, urine culture+ ecoli Improving, lactic acidosis and wbc normalized, fever resolved, bp and heart rate improved. She is started on  Merrem to empirically cover ESBL on admission, urine culture resulted, abx changed to keflex according to sensitivity testing. She is also stated on vanc on admission to cover left thigh infection due to complain of left thigh pain with h/o left thigh infection. Changed to oral doxycycline with clinical improvement and soft tissue US to left thigh showed generalized edema, no fluids collection.   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 On exam Medial left thigh hard and tender to touch,no open wound  She is stated on vanc to cover left thigh infection due to complain of left thigh pain with h/o left thigh infection. Changed to oral doxycycline with clinical improvement and soft tissue US to left thigh showed generalized edema, no fluids collection. Diurese, improving.   Chronic left lower leg wound  - non healing - 2/2 to PVD vs Venous insufficiency  Wound care consult appreciated  Xeroform with Kerlix  Chronic diastolic CHF - ECHO on Q000111Q EF 60-65% G1DD Home meds demadex held since admission due to sepsis/hypotension and aki She received ivf for sepsis treatment, +6.9 liters with lower extremity edema, d/c ivf on 2/1, restart diuresis with iv lasix on 2/2. cr continue to be stable,  Good urine output ,still 3.6 liter positive on 2/3  , continue iv lasix  Acute on CKD stage III - prerenal - Cr baseline 1.2 Improved, cr back to baseline with hydration and treating infection. Cr 1.58 on admission, cr 0.9 on 2/1, off  ivf since 2/1, back on lasix since 2/2.  Insulin dependent DM type 2 - stable, A1C 7.2 (06/26/2016)  Has hypoglycemia even when she was kept on npo due to encephalopahty, home med lantus held since admission She is on SSI and hypoglycemia protocol Mental status improving, resume diet, restart lantus at lower dose since 2/2   Yeast under large abdominal panniculus Topical nystatin, skin care   Morbid obesity, osa on cpap Body mass index is 72.5 kg/m.  H/o right aka, baseline wheelchair to bed bound.    DVT prophylaxis: Lovenox  Code Status: FULL  Family Communication: None at bedside Disposition Plan:  [facility] with 1-2 more days of lasix, pssible Sunday or Monday discharge    Consultants:   None   Procedures:   A line placed 11/12/16  Antimicrobials:  Zosyn 11/12/16 OTO  Merrem 11/12/16 to 2/2  Vancomycin 11/12/16 to 2/2  Keflex and doxycycline from  2/2   Objective: Vitals:   11/15/16 2217 11/16/16 0540 11/16/16 0727 11/16/16 0902  BP:  (!) 144/71 (!) 155/72   Pulse: 73 71 71   Resp: 18 20 (!) 22   Temp:  98.7 F (37.1 C) 98.6 F (37 C)   TempSrc:  Axillary Oral   SpO2: 100% 99% 99% 99%  Weight:      Height:        Intake/Output Summary (Last 24 hours) at 11/16/16 1151 Last data filed at 11/16/16 0915  Gross per 24 hour  Intake              530 ml  Output             37 00 ml  Net            -3170 ml   Filed Weights   11/12/16 1400 11/15/16 0945  Weight: (!) 136.3 kg (300 lb 7.8 oz) (!) 141.5 kg (311 lb 15.2 oz)    Examination:  General exam: obese, chronically ill, alert and oriented HEENT: AC/AT, PERRLA, OP moist and clear Respiratory system: Clear to auscultation. No wheezes,crackle or rhonchi Cardiovascular system: S1 & S2 heard, RRR. No JVD, murmurs, rubs or gallops Gastrointestinal system: Abdomen is nondistended, soft and nontender. Normal bowel sounds heard. Large abdominal panniculus Central nervous system: Alert and oriented. No focal neurological deficits. Extremities: medial  left thigh less hard , less  Tender ,  Left lower leg with chronic wound light yellow exudate, dressing in place,  R BKA Skin: No rashes  Data Reviewed: I have personally reviewed following labs and imaging studies  CBC:  Recent Labs Lab 11/12/16 0200 11/12/16 1515 11/13/16 0355 11/14/16 0358 11/15/16 0404 11/16/16 0604  WBC 15.3* 19.9* 18.4* 11.9* 6.2 6.3  NEUTROABS 13.2* 18.8* 16.7* 9.9* 4.2  --   HGB 12.5 13.6 9.9* 9.4* 8.8* 9.8*  HCT 40.6 42.7 32.3* 30.7* 28.7* 31.1*  MCV 89.8 89.0 90.5 90.8 90.3 88.6  PLT 231 180 160 162 150 [id number]   Basic Metabolic Panel:  Recent Labs Lab 11/12/16 1515 11/13/16 0355 11/14/16 0358 11/15/16 0404 11/16/16 0604  NA 142 143 146* 144 142  K 4.5 3.9 3.9 4.1 3.7  CL 102 107 113* 111 105  CO2 31 29 29 28 31   GLUCOSE 113* 102* 127* 156* 123*  BUN QUANTITY NOT SUFFICIENT, UNABLE  TO PERFORM TEST 33* 25* 16 11  CREATININE QUANTITY NOT SUFFICIENT, UNABLE TO PERFORM TEST 1.49* 1.19* 1.10* 0.94  CALCIUM 8.6* 7.8* 8.0* 8.5* 8.8*  MG  --  1.8  --   --  1.7  PHOS  --  2.8  --   --   --    GFR: Estimated Creatinine Clearance: 66.7 mL/min (by C-G formula based on SCr of 0.94 mg/dL). Liver Function Tests:  Recent Labs Lab 11/12/16 0200 11/12/16 1515 11/13/16 0355  AST 36 QUANTITY NOT SUFFICIENT, UNABLE TO PERFORM TEST 20  ALT 31 25 21   ALKPHOS 170* 118 88  BILITOT 0.8 QUANTITY NOT SUFFICIENT, UNABLE TO PERFORM TEST 1.0  PROT 8.9* QUANTITY NOT SUFFICIENT, UNABLE TO PERFORM TEST 6.7  ALBUMIN 3.1* QUANTITY NOT SUFFICIENT, UNABLE TO PERFORM TEST 2.1*   No results for input(s): LIPASE, AMYLASE in the last 168 hours.  Recent Labs Lab 11/12/16 0747 11/13/16 0355  AMMONIA 45* 21   Coagulation Profile:  Recent Labs Lab 11/12/16 0200 11/12/16 1515  INR 1.06 1.12   Cardiac Enzymes:  Recent Labs Lab 11/13/16 0355  CKTOTAL 79   BNP (last 3 results) No results for input(s): PROBNP in the last 8760 hours. HbA1C: No results for input(s): HGBA1C in the last 72 hours. CBG:  Recent Labs Lab 11/15/16 1644 11/15/16 2021 11/16/16 0053 11/16/16 0418 11/16/16 0749  GLUCAP 133* 163* 135* 125* 112*   Lipid Profile: No results for input(s): CHOL, HDL, LDLCALC, TRIG, CHOLHDL, LDLDIRECT in the last 72 hours. Thyroid Function Tests: No results for input(s): TSH, T4TOTAL, FREET4, T3FREE, THYROIDAB in the last 72 hours. Anemia Panel: No results for input(s): VITAMINB12, FOLATE, FERRITIN, TIBC, IRON, RETICCTPCT in the last 72 hours. Sepsis Labs:  Recent Labs Lab 11/12/16 0747 11/12/16 1233 11/12/16 1514 11/12/16 1515 11/13/16 0355  PROCALCITON  --   --   --  12.76  --   LATICACIDVEN 3.2* 2.5* 2.3*  --  0.9    Recent Results (from the past 240 hour(s))  Culture, blood (Routine x 2)     Status: None (Preliminary result)   Collection Time: 11/12/16  2:00 AM   Result Value Ref Range Status   Specimen Description BLOOD LEFT ANTECUBITAL  Final   Special Requests BOTTLES DRAWN AEROBIC AND ANAEROBIC 5ML  Final   Culture   Final    NO GROWTH 3 DAYS Performed at [facility], [place] [address], [place], [place] [address]    Report Status PENDING  Incomplete  Urine culture     Status: Abnormal   Collection Time: 11/12/16  2:29 AM  Result Value Ref Range Status   Specimen Description URINE, RANDOM  Final   Special Requests NONE  Final   Culture >=100,000 COLONIES/mL ESCHERICHIA COLI (A)  Final   Report Status 11/15/2016 FINAL  Final   Organism ID, Bacteria ESCHERICHIA COLI (A)  Final      Susceptibility   Escherichia coli - MIC*    AMPICILLIN >=32 RESISTANT Resistant     CEFAZOLIN <=4 SENSITIVE Sensitive     CEFTRIAXONE <=1 SENSITIVE Sensitive     CIPROFLOXACIN >=4 RESISTANT Resistant     GENTAMICIN <=1 SENSITIVE Sensitive     IMIPENEM <=0.25 SENSITIVE Sensitive     NITROFURANTOIN <=16 SENSITIVE Sensitive     TRIMETH/SULFA >=320 RESISTANT Resistant     AMPICILLIN/SULBACTAM 16 INTERMEDIATE Intermediate     PIP/TAZO <=4 SENSITIVE Sensitive     Extended ESBL NEGATIVE Sensitive     * >=100,000 COLONIES/mL ESCHERICHIA COLI  Culture, blood (Routine x 2)     Status: None (Preliminary result)   Collection Time: 11/12/16  2:30 AM  Result Value Ref Range Status   Specimen Description BLOOD RIGHT SHOULDER  Final   Special Requests IN PEDIATRIC BOTTLE 3ML  Final   Culture   Final    NO GROWTH 3 DAYS Performed at [facility], [address], [place], [place] [address]    Report Status PENDING  Incomplete  MRSA PCR Screening     Status: Abnormal   Collection Time: 11/12/16  2:20 PM  Result Value Ref Range Status   MRSA by PCR POSITIVE (A) NEGATIVE Final    Comment:        The  GeneXpert MRSA Assay (FDA approved for NASAL specimens only), is one component of a comprehensive MRSA colonization surveillance program. It is not intended to  diagnose MRSA infection nor to guide or monitor treatment for MRSA infections. RESULT CALLED TO, READ BACK BY AND VERIFIED WITH: Recardo Evangelist RN AT 1823 ON 11/12/16 BY S.VANHOORNE      Radiology Studies: [place] Lt Lower Extrem Ltd Soft Tissue Non Vascular  Result Date: 11/14/2016 CLINICAL DATA:  Left thigh pain. EXAM: ULTRASOUND LEFT LOWER EXTREMITY LIMITED TECHNIQUE: Ultrasound examination of the lower extremity soft tissues was performed in the area of clinical concern. COMPARISON:  11/12/2016 . FINDINGS: Diffuse soft tissue edema. No focal definitive fluid collection or solid abnormality identified. IMPRESSION: Diffuse edema. No focal abnormality identified. Further evaluation with MRI can be obtained as needed . Electronically Signed   By: Marcello Moores  Register   On: 11/14/2016 13:50      Scheduled Meds: . budesonide (PULMICORT) nebulizer solution  0.25 mg Nebulization BID  . cephALEXin  500 mg Oral Q12H  . Chlorhexidine Gluconate Cloth  6 each Topical Q0600  . doxycycline  100 mg Oral Q12H  . enoxaparin (LOVENOX) injection  70 mg Subcutaneous Q24H  . fluticasone  2 spray Each Nare QHS  . furosemide  40 mg Intravenous Daily  . gabapentin  600 mg Oral BID  . insulin aspart  0-9 Units Subcutaneous Q4H  . insulin glargine  10 Units Subcutaneous QHS  . ipratropium-albuterol  3 mL Nebulization BID  . mupirocin ointment  1 application Nasal BID  . nystatin   Topical BID   Continuous Infusions:    LOS: 4 days   Time spent > 70mins  Amere Iott, MD PhD [facility] Pager [phone]  If 7PM-7AM, please contact night-coverage [email or web address] Password [email or web address] 11/16/2016, 11:51 AM

## 2016-11-16 NOTE — Progress Notes (Signed)
CPAP removed at 0745 for patient to eat breakfast

## 2016-11-16 NOTE — Procedures (Signed)
Placed patient on CPAP for the night.  Patient is tolerating well at this time. 

## 2016-11-16 NOTE — Clinical Social Work Note (Signed)
Patient discussed with Dr. Erlinda Hong. Is hopeful that patient will be stable for d/c in 1-2 more days (Sunday vs Monday).  CSW services will continue to monitor.  Plan d/c back to [facility] when stable.  Lorie Phenix. Pauline Good, [place] (weekend coverage)

## 2016-11-17 LAB — BASIC METABOLIC PANEL
Anion gap: 5 (ref 5–15)
BUN: 11 mg/dL (ref 6–20)
CALCIUM: 8.8 mg/dL — AB (ref 8.9–10.3)
CO2: 31 mmol/L (ref 22–32)
CREATININE: 0.96 mg/dL (ref 0.44–1.00)
Chloride: 103 mmol/L (ref 101–111)
GFR calc Af Amer: >60 mL/min (ref >60)
GFR, EST NON AFRICAN AMERICAN: 58 mL/min — AB (ref >60)
Glucose, Bld: 112 mg/dL — ABNORMAL HIGH (ref 65–99)
POTASSIUM: 3.8 mmol/L (ref 3.5–5.1)
SODIUM: 139 mmol/L (ref 135–145)

## 2016-11-17 LAB — MAGNESIUM: MAGNESIUM: 1.5 mg/dL — AB (ref 1.7–2.4)

## 2016-11-17 LAB — CULTURE, BLOOD (ROUTINE X 2)
CULTURE: NO GROWTH
CULTURE: NO GROWTH

## 2016-11-17 LAB — GLUCOSE, CAPILLARY
Glucose-Capillary: 103 mg/dL — ABNORMAL HIGH (ref 65–99)
Glucose-Capillary: 147 mg/dL — ABNORMAL HIGH (ref 65–99)
Glucose-Capillary: 133 mg/dL — ABNORMAL HIGH (ref 65–99)
Glucose-Capillary: 223 mg/dL — ABNORMAL HIGH (ref 65–99)

## 2016-11-17 NOTE — Progress Notes (Signed)
PROGRESS NOTE [employer] [name]   [id number] DOB: [birth year]  DOA: 11/12/2016 PCP: Gildardo Cranker, DO   Brief Narrative:  Heather Zimmerman. Richarson is a 71 y.o. female with history of diastolic CHF, noncompliant with CPAP for sleep apnea, diabetes mellitus, peripheral vascular disease status post right BKA, morbid obesity who presented with fever, chills, confusion, hypoxia/hypercarbia and hypotension.   Patient was started on IVF, IV Abx and CPAP, and admitted for admitt Severe Sepsis of Urinary source, and Acute Respiratory Failure.  Subjective: No acute events noted overnight, comfortable on 2liter oxygen ( on 2liter chronically), on cpap at night chronically   Assessment & Plan:  1.Severe Sepsis of urinary source: Improving. Blood cultures no growth, urine culture+ ecoli Switched from [place] to keflex based on C/S results.    2.Acute  On chronic respiratory failure: Improving. On  O2 2 l/min by [place];  continue qhs cpap  Secondary to Sepsis and possible obesity hypoventilation syndrome CTA chest negative for Large PE  Continue CPAP qHS     3.Left thigh pain/edema:   She was stated on vanc to cover left thigh infection due to complain of left thigh pain with h/o left thigh infection. Changed to oral doxycycline with clinical improvement and soft tissue ultrasound to left thigh showed generalized edema, no fluids collection.Improving.    4.Chronic left lower leg wound  - non healing - 2/2 to PVD vs Venous insufficiency  Wound care consult seen pt  Xeroform with Kerlix  5.Chronic diastolic CHF - ECHO on Q000111Q EF 60-65% G1DD Home meds demadex held since admission due to sepsis/hypotension and aki  6.Acute on CKD stage III - prerenal - Cr baseline 1.2 Improved with hydration and treating infection  7.Insulin dependent DM type 2 - stable, A1C 7.2 (06/26/2016)  She is on SSI and hypoglycemia protocol Lower dose lantus restarted   8.Yeast under large  abdominal panniculus Topical nystatin, skin care  9.Acute toxic  encephalopathy - infectious etiology - resolved  Morbid obesity, osa on cpap Body mass index is 72.5 kg/m.  H/o right aka, baseline wheelchair to bed bound.    DVT prophylaxis: Lovenox  Code Status: FULL  Family Communication: None at bedside Disposition Plan:  return [facility] with 1-2 more days of lasix, pssible Sunday or Monday discharge    Consultants:   None   Procedures:   A line placed 11/12/16  Antimicrobials:  Zosyn 11/12/16 OTO  Merrem 11/12/16 to 2/2  Vancomycin 11/12/16 to 2/2  Keflex and doxycycline from 2/2   Objective: Vitals:   11/16/16 2159 11/16/16 2318 11/17/16 0500 11/17/16 0817  BP: 128/62  (!) 141/79   Pulse: 77 71 71   Resp: 20 18 20   Temp: 97.7 F (36.5 C)  98.6 F (37 C)   TempSrc: Oral  Tympanic   SpO2: 94% 99% 99% 99%  Weight:      Height:        Intake/Output Summary (Last 24 hours) at 11/17/16 1017 Last data filed at 11/17/16 0525  Gross per 24 hour  Intake             1560 ml  Output             35 50 ml  Net            -1990 ml   Filed Weights   11/12/16 1400 11/15/16 0945  Weight: (!) 136.3 kg (300 lb 7.8 oz) (!) 141.5 kg (311 lb 15.2 oz)  Examination:  General exam: morbidly obese, NAD HEENT: NCAT, PERRLA, OP moist and clear Respiratory system: Mildly Diminished ,otherwise CTA Cardiovascular system: S1 & S2 heard, RRR. No JVD, murmurs, rubs or gallops Gastrointestinal system: Abdomen is soft and nontender. Normal bowel sounds   Central nervous system: Alert and oriented. Extremities: medial  left thigh less hard , less  Tender ,  Left lower leg with chronic wound light yellow exudate, dressing in place,  R BKA Skin: No rashes  Data Reviewed: I have personally reviewed following labs and imaging studies  CBC:  Recent Labs Lab 11/12/16 0200 11/12/16 1515 11/13/16 0355 11/14/16 0358 11/15/16 0404 11/16/16 0604  WBC 15.3* 19.9* 18.4*  11.9* 6.2 6.3  NEUTROABS 13.2* 18.8* 16.7* 9.9* 4.2  --   HGB 12.5 13.6 9.9* 9.4* 8.8* 9.8*  HCT 40.6 42.7 32.3* 30.7* 28.7* 31.1*  MCV 89.8 89.0 90.5 90.8 90.3 88.6  PLT 231 180 160 162 150 [id number]   Basic Metabolic Panel:  Recent Labs Lab 11/13/16 0355 11/14/16 0358 11/15/16 0404 11/16/16 0604 11/17/16 0523  NA 143 146* 144 142 139  K 3.9 3.9 4.1 3.7 3.8  CL 107 113* 111 105 103  CO2 29 29 28 31 31   GLUCOSE 102* 127* 156* 123* 112*  BUN 33* 25* 16 11 11   CREATININE 1.49* 1.19* 1.10* 0.94 0.96  CALCIUM 7.8* 8.0* 8.5* 8.8* 8.8*  MG 1.8  --   --  1.7 1.5*  PHOS 2.8  --   --   --   --    GFR: Estimated Creatinine Clearance: 65.3 mL/min (by C-G formula based on SCr of 0.96 mg/dL). Liver Function Tests:  Recent Labs Lab 11/12/16 0200 11/12/16 1515 11/13/16 0355  AST 36 QUANTITY NOT SUFFICIENT, UNABLE TO PERFORM TEST 20  ALT 31 25 21   ALKPHOS 170* 118 88  BILITOT 0.8 QUANTITY NOT SUFFICIENT, UNABLE TO PERFORM TEST 1.0  PROT 8.9* QUANTITY NOT SUFFICIENT, UNABLE TO PERFORM TEST 6.7  ALBUMIN 3.1* QUANTITY NOT SUFFICIENT, UNABLE TO PERFORM TEST 2.1*   No results for input(s): LIPASE, AMYLASE in the last 168 hours.  Recent Labs Lab 11/12/16 0747 11/13/16 0355  AMMONIA 45* 21   Coagulation Profile:  Recent Labs Lab 11/12/16 0200 11/12/16 1515  INR 1.06 1.12   Cardiac Enzymes:  Recent Labs Lab 11/13/16 0355  CKTOTAL 79   BNP (last 3 results) No results for input(s): PROBNP in the last 8760 hours. HbA1C: No results for input(s): HGBA1C in the last 72 hours. CBG:  Recent Labs Lab 11/16/16 0749 11/16/16 1154 11/16/16 1634 11/16/16 2156 11/17/16 0730  GLUCAP 112* 133* 152* 123* 103*   Lipid Profile: No results for input(s): CHOL, HDL, LDLCALC, TRIG, CHOLHDL, LDLDIRECT in the last 72 hours. Thyroid Function Tests: No results for input(s): TSH, T4TOTAL, FREET4, T3FREE, THYROIDAB in the last 72 hours. Anemia Panel: No results for input(s): VITAMINB12,  FOLATE, FERRITIN, TIBC, IRON, RETICCTPCT in the last 72 hours. Sepsis Labs:  Recent Labs Lab 11/12/16 0747 11/12/16 1233 11/12/16 1514 11/12/16 1515 11/13/16 0355  PROCALCITON  --   --   --  12.76  --   LATICACIDVEN 3.2* 2.5* 2.3*  --  0.9    Recent Results (from the past 240 hour(s))  Culture, blood (Routine x 2)     Status: None (Preliminary result)   Collection Time: 11/12/16  2:00 AM  Result Value Ref Range Status   Specimen Description BLOOD LEFT ANTECUBITAL  Final   Special Requests BOTTLES DRAWN AEROBIC AND  ANAEROBIC 5ML  Final   Culture   Final    NO GROWTH 4 DAYS Performed at [facility], [place] [address], [place], [place] [address]    Report Status PENDING  Incomplete  Urine culture     Status: Abnormal   Collection Time: 11/12/16  2:29 AM  Result Value Ref Range Status   Specimen Description URINE, RANDOM  Final   Special Requests NONE  Final   Culture >=100,000 COLONIES/mL ESCHERICHIA COLI (A)  Final   Report Status 11/15/2016 FINAL  Final   Organism ID, Bacteria ESCHERICHIA COLI (A)  Final      Susceptibility   Escherichia coli - MIC*    AMPICILLIN >=32 RESISTANT Resistant     CEFAZOLIN <=4 SENSITIVE Sensitive     CEFTRIAXONE <=1 SENSITIVE Sensitive     CIPROFLOXACIN >=4 RESISTANT Resistant     GENTAMICIN <=1 SENSITIVE Sensitive     IMIPENEM <=0.25 SENSITIVE Sensitive     NITROFURANTOIN <=16 SENSITIVE Sensitive     TRIMETH/SULFA >=320 RESISTANT Resistant     AMPICILLIN/SULBACTAM 16 INTERMEDIATE Intermediate     PIP/TAZO <=4 SENSITIVE Sensitive     Extended ESBL NEGATIVE Sensitive     * >=100,000 COLONIES/mL ESCHERICHIA COLI  Culture, blood (Routine x 2)     Status: None (Preliminary result)   Collection Time: 11/12/16  2:30 AM  Result Value Ref Range Status   Specimen Description BLOOD RIGHT SHOULDER  Final   Special Requests IN PEDIATRIC BOTTLE 3ML  Final   Culture   Final    NO GROWTH 4 DAYS Performed at [facility], [address] [place], [place] [address]    Report Status PENDING  Incomplete  MRSA PCR Screening     Status: Abnormal   Collection Time: 11/12/16  2:20 PM  Result Value Ref Range Status   MRSA by PCR POSITIVE (A) NEGATIVE Final    Comment:        The GeneXpert MRSA Assay (FDA approved for NASAL specimens only), is one component of a comprehensive MRSA colonization surveillance program. It is not intended to diagnose MRSA infection nor to guide or monitor treatment for MRSA infections. RESULT CALLED TO, READ BACK BY AND VERIFIED WITH: [place] RN AT 1823 ON 11/12/16 BY S.VANHOORNE      Radiology Studies: No results found.    Scheduled Meds: . budesonide (PULMICORT) nebulizer solution  0.25 mg Nebulization BID  . cephALEXin  500 mg Oral Q12H  . doxycycline  100 mg Oral Q12H  . enoxaparin (LOVENOX) injection  70 mg Subcutaneous Q24H  . fluticasone  2 spray Each Nare QHS  . furosemide  40 mg Intravenous Daily  . gabapentin  600 mg Oral BID  . insulin aspart  0-9 Units Subcutaneous Q4H  . insulin glargine  10 Units Subcutaneous QHS  . ipratropium-albuterol  3 mL Nebulization BID  . nystatin   Topical BID   Continuous Infusions:    LOS: 5 days   Time spent > 31mins  OSEI-BONSU,Edwinna Rochette, MD PhD [facility] Pager [phone]  If 7PM-7AM, please contact night-coverage [email or web address] Password [email or web address] 11/17/2016, 10:17 AM

## 2016-11-18 DIAGNOSIS — L03116 Cellulitis of left lower limb: Secondary | ICD-10-CM

## 2016-11-18 DIAGNOSIS — A4151 Sepsis due to Escherichia coli [E. coli]: Secondary | ICD-10-CM

## 2016-11-18 LAB — GLUCOSE, CAPILLARY
GLUCOSE-CAPILLARY: 99 mg/dL (ref 65–99)
GLUCOSE-CAPILLARY: 157 mg/dL — AB (ref 65–99)
GLUCOSE-CAPILLARY: 151 mg/dL — AB (ref 65–99)
GLUCOSE-CAPILLARY: 126 mg/dL — AB (ref 65–99)

## 2016-11-18 MED ORDER — TORSEMIDE 20 MG PO TABS
30.0000 mg | ORAL_TABLET | Freq: Every day | ORAL | Status: DC
  Administered 2016-11-19: 30 mg via ORAL
  Filled 2016-11-18: qty 1

## 2016-11-18 MED ORDER — MAGNESIUM SULFATE 2 GM/50ML IV SOLN
2.0000 g | Freq: Once | INTRAVENOUS | Status: AC
  Administered 2016-11-18: 2 g via INTRAVENOUS
  Filled 2016-11-18: qty 50

## 2016-11-18 MED ORDER — INSULIN ASPART 100 UNIT/ML ~~LOC~~ SOLN
0.0000 [IU] | Freq: Three times a day (TID) | SUBCUTANEOUS | Status: DC
  Administered 2016-11-19: 2 [IU] via SUBCUTANEOUS

## 2016-11-18 MED ORDER — FLUCONAZOLE 100 MG PO TABS
150.0000 mg | ORAL_TABLET | Freq: Once | ORAL | Status: AC
  Administered 2016-11-18: 150 mg via ORAL
  Filled 2016-11-18: qty 2

## 2016-11-18 NOTE — Progress Notes (Signed)
Has thick white discharge from vagina and reports itching.

## 2016-11-18 NOTE — Progress Notes (Signed)
PROGRESS NOTE [employer]. [name]   [id number] DOB: 11/17/1945  DOA: 11/12/2016 PCP: Gildardo Cranker, DO   Brief Narrative:  Heather Zimmerman. Nissen is a 71 y.o. female with history of diastolic CHF, noncompliant with CPAP for sleep apnea, diabetes mellitus, peripheral vascular disease status post right BKA, morbid obesity who presented with fever, chills, confusion, hypoxia/hypercarbia and hypotension.   Patient was started on IVF, IV Abx and CPAP, and admitted for admitt Severe Sepsis of Urinary source, and Acute Respiratory Failure.  Subjective: No acute events noted overnight, comfortable on 2liter oxygen ( on 2liter chronically), on cpap at night chronically   Assessment & Plan:  1.Severe Sepsis of urinary source:  Improving. Blood cultures no growth, urine culture+ ecoli Switched from [place] to keflex based on C/S results.  2.Acute  On chronic respiratory failure: Improving. On  O2 2 l/min by [place];  continue qhs cpap  Secondary to Sepsis and possible obesity hypoventilation syndrome CTA chest negative for Large PE  Continue CPAP qHS    3.Left thigh pain/edema: likely celluitis. Pain improving. MRSA positive. - Cont doxy  - Vanc d/c   4.Chronic left lower leg wound  - non healing - 2/2 to PVD vs Venous insufficiency  Wound care consult seen pt  Xeroform with Kerlix  5.Chronic diastolic CHF - ECHO on [id number] EF 60-65% G1DD - will switch to home torsemide- 30mg  daily.  - D/c Iv lasix 40 daily.  6.Acute on CKD stage III - prerenal - Cr baseline 1.2 Improved with hydration and treating infection  7.Insulin dependent DM type 2 - stable, A1C 7.2 (06/26/2016)  She is on SSI and hypoglycemia protocol Lower dose lantus restarted  8. Vaginal yeast infection, Yeast under large abdominal panniculus. VAginitis  likley 2/2 to antibiotic use. - Topical nystatin, skin care - Fluconazole x1 dose, might require more, considering DM  9.Acute toxic  encephalopathy -  infectious etiology - resolved  Morbid obesity, osa on cpap Body mass index is 72.5 kg/m.  H/o right aka, baseline wheelchair to bed bound.    DVT prophylaxis: Lovenox  Code Status: FULL  Family Communication: None at bedside Disposition Plan:  return to [facility] with 1-2 more days of lasix, pssible Sunday or Monday discharge    Consultants:   None   Procedures:   A line placed 11/12/16  Antimicrobials:  Zosyn 11/12/16 OTO  Merrem 11/12/16 to 2/2  Vancomycin 11/12/16 to 2/2  Keflex and doxycycline from 2/2   Objective: Vitals:   11/17/16 2330 11/18/16 0402 11/18/16 0828 11/18/16 1600  BP:  125/69  128/64  Pulse:  75 78 80  Resp: 18 20 18 20  Temp:  98 F (36.7 C)  98.6 F (37 C)  TempSrc:  Oral  Oral  SpO2:  100% 99% 98%  Weight:      Height:        Intake/Output Summary (Last 24 hours) at 11/18/16 1933 Last data filed at 11/18/16 1645  Gross per 24 hour  Intake             1320 ml  Output             26 50 ml  Net            -1330 ml   Filed Weights   11/12/16 1400 11/15/16 0945  Weight: (!) 136.3 kg (300 lb 7.8 oz) (!) 141.5 kg (311 lb 15.2 oz)    Examination:  General exam: morbidly obese, NAD HEENT: NCAT,  PERRLA, OP moist and clear Respiratory system: Mildly Diminished ,otherwise CTA Cardiovascular system: S1 & S2 heard, RRR. No JVD, murmurs, rubs or gallops Gastrointestinal system: Abdomen is soft and nontender. Normal bowel sounds   Central nervous system: Alert and oriented. Extremities: medial  left thigh less hard , less  Tender ,  Left lower leg with chronic wound light yellow exudate, dressing in place,  R BKA Skin: No rashes  Data Reviewed: I have personally reviewed following labs and imaging studies  CBC:  Recent Labs Lab 11/12/16 0200 11/12/16 1515 11/13/16 0355 11/14/16 0358 11/15/16 0404 11/16/16 0604  WBC 15.3* 19.9* 18.4* 11.9* 6.2 6.3  NEUTROABS 13.2* 18.8* 16.7* 9.9* 4.2  --   HGB 12.5 13.6 9.9* 9.4* 8.8* 9.8*    HCT 40.6 42.7 32.3* 30.7* 28.7* 31.1*  MCV 89.8 89.0 90.5 90.8 90.3 88.6  PLT 231 180 160 162 150 [id number]   Basic Metabolic Panel:  Recent Labs Lab 11/13/16 0355 11/14/16 0358 11/15/16 0404 11/16/16 0604 11/17/16 0523  NA 143 146* 144 142 139  K 3.9 3.9 4.1 3.7 3.8  CL 107 113* 111 105 103  CO2 29 29 28 31 31   GLUCOSE 102* 127* 156* 123* 112*  BUN 33* 25* 16 11 11   CREATININE 1.49* 1.19* 1.10* 0.94 0.96  CALCIUM 7.8* 8.0* 8.5* 8.8* 8.8*  MG 1.8  --   --  1.7 1.5*  PHOS 2.8  --   --   --   --    GFR: Estimated Creatinine Clearance: 65.3 mL/min (by C-G formula based on SCr of 0.96 mg/dL). Liver Function Tests:  Recent Labs Lab 11/12/16 0200 11/12/16 1515 11/13/16 0355  AST 36 QUANTITY NOT SUFFICIENT, UNABLE TO PERFORM TEST 20  ALT 31 25 21   ALKPHOS 170* 118 88  BILITOT 0.8 QUANTITY NOT SUFFICIENT, UNABLE TO PERFORM TEST 1.0  PROT 8.9* QUANTITY NOT SUFFICIENT, UNABLE TO PERFORM TEST 6.7  ALBUMIN 3.1* QUANTITY NOT SUFFICIENT, UNABLE TO PERFORM TEST 2.1*   No results for input(s): LIPASE, AMYLASE in the last 168 hours.  Recent Labs Lab 11/12/16 0747 11/13/16 0355  AMMONIA 45* 21   Coagulation Profile:  Recent Labs Lab 11/12/16 0200 11/12/16 1515  INR 1.06 1.12   Cardiac Enzymes:  Recent Labs Lab 11/13/16 0355  CKTOTAL 79   BNP (last 3 results) No results for input(s): PROBNP in the last 8760 hours. HbA1C: No results for input(s): HGBA1C in the last 72 hours. CBG:  Recent Labs Lab 11/17/16 1711 11/17/16 2041 11/18/16 0824 11/18/16 1247 11/18/16 1701  GLUCAP 133* 223* 99 157* 151*   Lipid Profile: No results for input(s): CHOL, HDL, LDLCALC, TRIG, CHOLHDL, LDLDIRECT in the last 72 hours. Thyroid Function Tests: No results for input(s): TSH, T4TOTAL, FREET4, T3FREE, THYROIDAB in the last 72 hours. Anemia Panel: No results for input(s): VITAMINB12, FOLATE, FERRITIN, TIBC, IRON, RETICCTPCT in the last 72 hours. Sepsis Labs:  Recent Labs Lab  11/12/16 0747 11/12/16 1233 11/12/16 1514 11/12/16 1515 11/13/16 0355  PROCALCITON  --   --   --  12.76  --   LATICACIDVEN 3.2* 2.5* 2.3*  --  0.9    Recent Results (from the past 240 hour(s))  Culture, blood (Routine x 2)     Status: None   Collection Time: 11/12/16  2:00 AM  Result Value Ref Range Status   Specimen Description BLOOD LEFT ANTECUBITAL  Final   Special Requests BOTTLES DRAWN AEROBIC AND ANAEROBIC 5ML  Final   Culture   Final  NO GROWTH 5 DAYS Performed at [facility], [place] [address] [place], [place] [address]    Report Status 11/17/2016 FINAL  Final  Urine culture     Status: Abnormal   Collection Time: 11/12/16  2:29 AM  Result Value Ref Range Status   Specimen Description URINE, RANDOM  Final   Special Requests NONE  Final   Culture >=100,000 COLONIES/mL ESCHERICHIA COLI (A)  Final   Report Status 11/15/2016 FINAL  Final   Organism ID, Bacteria ESCHERICHIA COLI (A)  Final      Susceptibility   Escherichia coli - MIC*    AMPICILLIN >=32 RESISTANT Resistant     CEFAZOLIN <=4 SENSITIVE Sensitive     CEFTRIAXONE <=1 SENSITIVE Sensitive     CIPROFLOXACIN >=4 RESISTANT Resistant     GENTAMICIN <=1 SENSITIVE Sensitive     IMIPENEM <=0.25 SENSITIVE Sensitive     NITROFURANTOIN <=16 SENSITIVE Sensitive     TRIMETH/SULFA >=320 RESISTANT Resistant     AMPICILLIN/SULBACTAM 16 INTERMEDIATE Intermediate     PIP/TAZO <=4 SENSITIVE Sensitive     Extended ESBL NEGATIVE Sensitive     * >=100,000 COLONIES/mL ESCHERICHIA COLI  Culture, blood (Routine x 2)     Status: None   Collection Time: 11/12/16  2:30 AM  Result Value Ref Range Status   Specimen Description BLOOD RIGHT SHOULDER  Final   Special Requests IN PEDIATRIC BOTTLE 3ML  Final   Culture   Final    NO GROWTH 5 DAYS Performed at [facility], [address] [place], [place] [address]    Report Status 11/17/2016 FINAL  Final  MRSA PCR Screening     Status: Abnormal   Collection Time:  11/12/16  2:20 PM  Result Value Ref Range Status   MRSA by PCR POSITIVE (A) NEGATIVE Final    Comment:        The GeneXpert MRSA Assay (FDA approved for NASAL specimens only), is one component of a comprehensive MRSA colonization surveillance program. It is not intended to diagnose MRSA infection nor to guide or monitor treatment for MRSA infections. RESULT CALLED TO, READ BACK BY AND VERIFIED WITH: Nobles RN AT 1823 ON 11/12/16 BY S.VANHOORNE      Radiology Studies: No results found.    Scheduled Meds: . budesonide (PULMICORT) nebulizer solution  0.25 mg Nebulization BID  . cephALEXin  500 mg Oral Q12H  . doxycycline  100 mg Oral Q12H  . enoxaparin (LOVENOX) injection  70 mg Subcutaneous Q24H  . fluticasone  2 spray Each Nare QHS  . furosemide  40 mg Intravenous Daily  . gabapentin  600 mg Oral BID  . insulin aspart  0-9 Units Subcutaneous Q4H  . insulin glargine  10 Units Subcutaneous QHS  . ipratropium-albuterol  3 mL Nebulization BID  . nystatin   Topical BID   Continuous Infusions:    LOS: 6 days   Time spent > 65mins  Bethena Roys, MD PhD [facility] Pager [phone]  If 7PM-7AM, please contact night-coverage [email or web address] Password [facility] 11/18/2016, 7:33 PM

## 2016-11-18 NOTE — Progress Notes (Signed)
Patient had mixed stool with urine, approx 258ml of urine.

## 2016-11-18 NOTE — Clinical Social Work Note (Signed)
MSW met with patient at bedside to discuss discharge planning. Patient/family unable to pay out of pocket for ambulance transport to [place], [place]. Patient is agreeable to returning to [facility]. Possible dc for tomorrow, 2/6, facility aware.   No further concerns reported at this time. MSW remains available as needed.   Glendon Axe, MSW [phone] 11/18/2016 2:15 PM

## 2016-11-18 NOTE — Care Management Important Message (Addendum)
Important Message  Patient Details IM Letter given to Cookie/Case Manager to present to Patient Name: Taylea Lathon. [place] MRN: [id number] Date of Birth: January 10, 1946   Medicare Important Message Given:  Yes    Kerin Salen 11/18/2016, 12:49 [place] Message  Patient Details  Name: Altheia Shadwell. Ott[place] MRN: [id number] Date of Birth: 07/08/1946   Medicare Important Message Given:  Yes    Kerin Salen 11/18/2016, 12:49 PM

## 2016-11-19 DIAGNOSIS — J9612 Chronic respiratory failure with hypercapnia: Secondary | ICD-10-CM | POA: Diagnosis not present

## 2016-11-19 DIAGNOSIS — G9340 Encephalopathy, unspecified: Secondary | ICD-10-CM | POA: Diagnosis present

## 2016-11-19 DIAGNOSIS — L03116 Cellulitis of left lower limb: Secondary | ICD-10-CM | POA: Diagnosis not present

## 2016-11-19 DIAGNOSIS — I739 Peripheral vascular disease, unspecified: Secondary | ICD-10-CM | POA: Diagnosis present

## 2016-11-19 DIAGNOSIS — G8929 Other chronic pain: Secondary | ICD-10-CM | POA: Diagnosis not present

## 2016-11-19 DIAGNOSIS — I7389 Other specified peripheral vascular diseases: Secondary | ICD-10-CM | POA: Diagnosis not present

## 2016-11-19 DIAGNOSIS — I872 Venous insufficiency (chronic) (peripheral): Secondary | ICD-10-CM | POA: Diagnosis not present

## 2016-11-19 DIAGNOSIS — I83029 Varicose veins of left lower extremity with ulcer of unspecified site: Secondary | ICD-10-CM | POA: Diagnosis not present

## 2016-11-19 DIAGNOSIS — J45909 Unspecified asthma, uncomplicated: Secondary | ICD-10-CM | POA: Diagnosis present

## 2016-11-19 DIAGNOSIS — N390 Urinary tract infection, site not specified: Secondary | ICD-10-CM | POA: Diagnosis not present

## 2016-11-19 DIAGNOSIS — E662 Morbid (severe) obesity with alveolar hypoventilation: Secondary | ICD-10-CM | POA: Diagnosis present

## 2016-11-19 DIAGNOSIS — A419 Sepsis, unspecified organism: Secondary | ICD-10-CM | POA: Diagnosis present

## 2016-11-19 DIAGNOSIS — I87312 Chronic venous hypertension (idiopathic) with ulcer of left lower extremity: Secondary | ICD-10-CM | POA: Diagnosis not present

## 2016-11-19 DIAGNOSIS — R791 Abnormal coagulation profile: Secondary | ICD-10-CM | POA: Diagnosis not present

## 2016-11-19 DIAGNOSIS — D631 Anemia in chronic kidney disease: Secondary | ICD-10-CM | POA: Diagnosis present

## 2016-11-19 DIAGNOSIS — E1149 Type 2 diabetes mellitus with other diabetic neurological complication: Secondary | ICD-10-CM | POA: Diagnosis present

## 2016-11-19 DIAGNOSIS — L89151 Pressure ulcer of sacral region, stage 1: Secondary | ICD-10-CM | POA: Diagnosis present

## 2016-11-19 DIAGNOSIS — Z6841 Body Mass Index (BMI) 40.0 and over, adult: Secondary | ICD-10-CM | POA: Diagnosis not present

## 2016-11-19 DIAGNOSIS — Z9989 Dependence on other enabling machines and devices: Secondary | ICD-10-CM | POA: Diagnosis not present

## 2016-11-19 DIAGNOSIS — L8992 Pressure ulcer of unspecified site, stage 2: Secondary | ICD-10-CM | POA: Diagnosis not present

## 2016-11-19 DIAGNOSIS — I10 Essential (primary) hypertension: Secondary | ICD-10-CM | POA: Diagnosis not present

## 2016-11-19 DIAGNOSIS — D509 Iron deficiency anemia, unspecified: Secondary | ICD-10-CM | POA: Diagnosis not present

## 2016-11-19 DIAGNOSIS — I130 Hypertensive heart and chronic kidney disease with heart failure and stage 1 through stage 4 chronic kidney disease, or unspecified chronic kidney disease: Secondary | ICD-10-CM | POA: Diagnosis not present

## 2016-11-19 DIAGNOSIS — J9602 Acute respiratory failure with hypercapnia: Secondary | ICD-10-CM | POA: Diagnosis not present

## 2016-11-19 DIAGNOSIS — E08618 Diabetes mellitus due to underlying condition with other diabetic arthropathy: Secondary | ICD-10-CM | POA: Diagnosis not present

## 2016-11-19 DIAGNOSIS — M6281 Muscle weakness (generalized): Secondary | ICD-10-CM | POA: Diagnosis not present

## 2016-11-19 DIAGNOSIS — E1122 Type 2 diabetes mellitus with diabetic chronic kidney disease: Secondary | ICD-10-CM | POA: Diagnosis present

## 2016-11-19 DIAGNOSIS — E1143 Type 2 diabetes mellitus with diabetic autonomic (poly)neuropathy: Secondary | ICD-10-CM | POA: Diagnosis not present

## 2016-11-19 DIAGNOSIS — J9620 Acute and chronic respiratory failure, unspecified whether with hypoxia or hypercapnia: Secondary | ICD-10-CM | POA: Diagnosis not present

## 2016-11-19 DIAGNOSIS — J9621 Acute and chronic respiratory failure with hypoxia: Secondary | ICD-10-CM | POA: Diagnosis not present

## 2016-11-19 DIAGNOSIS — E119 Type 2 diabetes mellitus without complications: Secondary | ICD-10-CM | POA: Diagnosis not present

## 2016-11-19 DIAGNOSIS — E872 Acidosis: Secondary | ICD-10-CM | POA: Diagnosis present

## 2016-11-19 DIAGNOSIS — E1151 Type 2 diabetes mellitus with diabetic peripheral angiopathy without gangrene: Secondary | ICD-10-CM | POA: Diagnosis present

## 2016-11-19 DIAGNOSIS — L03115 Cellulitis of right lower limb: Secondary | ICD-10-CM | POA: Diagnosis not present

## 2016-11-19 DIAGNOSIS — J9622 Acute and chronic respiratory failure with hypercapnia: Secondary | ICD-10-CM | POA: Diagnosis not present

## 2016-11-19 DIAGNOSIS — R0603 Acute respiratory distress: Secondary | ICD-10-CM | POA: Diagnosis not present

## 2016-11-19 DIAGNOSIS — R4781 Slurred speech: Secondary | ICD-10-CM | POA: Diagnosis not present

## 2016-11-19 DIAGNOSIS — R404 Transient alteration of awareness: Secondary | ICD-10-CM | POA: Diagnosis not present

## 2016-11-19 DIAGNOSIS — I83023 Varicose veins of left lower extremity with ulcer of ankle: Secondary | ICD-10-CM | POA: Diagnosis not present

## 2016-11-19 DIAGNOSIS — I83028 Varicose veins of left lower extremity with ulcer other part of lower leg: Secondary | ICD-10-CM | POA: Diagnosis not present

## 2016-11-19 DIAGNOSIS — R6521 Severe sepsis with septic shock: Secondary | ICD-10-CM | POA: Diagnosis present

## 2016-11-19 DIAGNOSIS — A4151 Sepsis due to Escherichia coli [E. coli]: Secondary | ICD-10-CM | POA: Diagnosis not present

## 2016-11-19 DIAGNOSIS — J9611 Chronic respiratory failure with hypoxia: Secondary | ICD-10-CM | POA: Diagnosis not present

## 2016-11-19 DIAGNOSIS — F329 Major depressive disorder, single episode, unspecified: Secondary | ICD-10-CM | POA: Diagnosis present

## 2016-11-19 DIAGNOSIS — R6520 Severe sepsis without septic shock: Secondary | ICD-10-CM | POA: Diagnosis not present

## 2016-11-19 DIAGNOSIS — Z794 Long term (current) use of insulin: Secondary | ICD-10-CM | POA: Diagnosis not present

## 2016-11-19 DIAGNOSIS — R930 Abnormal findings on diagnostic imaging of skull and head, not elsewhere classified: Secondary | ICD-10-CM | POA: Diagnosis not present

## 2016-11-19 DIAGNOSIS — E08311 Diabetes mellitus due to underlying condition with unspecified diabetic retinopathy with macular edema: Secondary | ICD-10-CM | POA: Diagnosis not present

## 2016-11-19 DIAGNOSIS — I2781 Cor pulmonale (chronic): Secondary | ICD-10-CM | POA: Diagnosis present

## 2016-11-19 DIAGNOSIS — I5032 Chronic diastolic (congestive) heart failure: Secondary | ICD-10-CM | POA: Diagnosis not present

## 2016-11-19 DIAGNOSIS — N179 Acute kidney failure, unspecified: Secondary | ICD-10-CM | POA: Diagnosis present

## 2016-11-19 DIAGNOSIS — Z79899 Other long term (current) drug therapy: Secondary | ICD-10-CM | POA: Diagnosis not present

## 2016-11-19 DIAGNOSIS — I6789 Other cerebrovascular disease: Secondary | ICD-10-CM | POA: Diagnosis not present

## 2016-11-19 DIAGNOSIS — N183 Chronic kidney disease, stage 3 (moderate): Secondary | ICD-10-CM | POA: Diagnosis not present

## 2016-11-19 DIAGNOSIS — I509 Heart failure, unspecified: Secondary | ICD-10-CM | POA: Diagnosis not present

## 2016-11-19 DIAGNOSIS — I83022 Varicose veins of left lower extremity with ulcer of calf: Secondary | ICD-10-CM | POA: Diagnosis not present

## 2016-11-19 DIAGNOSIS — K219 Gastro-esophageal reflux disease without esophagitis: Secondary | ICD-10-CM | POA: Diagnosis not present

## 2016-11-19 DIAGNOSIS — G4733 Obstructive sleep apnea (adult) (pediatric): Secondary | ICD-10-CM | POA: Diagnosis not present

## 2016-11-19 DIAGNOSIS — E1140 Type 2 diabetes mellitus with diabetic neuropathy, unspecified: Secondary | ICD-10-CM | POA: Diagnosis not present

## 2016-11-19 DIAGNOSIS — G894 Chronic pain syndrome: Secondary | ICD-10-CM | POA: Diagnosis not present

## 2016-11-19 DIAGNOSIS — E1165 Type 2 diabetes mellitus with hyperglycemia: Secondary | ICD-10-CM | POA: Diagnosis present

## 2016-11-19 DIAGNOSIS — Z89511 Acquired absence of right leg below knee: Secondary | ICD-10-CM | POA: Diagnosis not present

## 2016-11-19 LAB — MAGNESIUM: MAGNESIUM: 1.6 mg/dL — AB (ref 1.7–2.4)

## 2016-11-19 LAB — CREATININE, SERUM
Creatinine, Ser: 1.07 mg/dL — ABNORMAL HIGH (ref 0.44–1.00)
GFR calc Af Amer: 59 mL/min — ABNORMAL LOW (ref >60)
GFR calc non Af Amer: 51 mL/min — ABNORMAL LOW (ref >60)

## 2016-11-19 LAB — GLUCOSE, CAPILLARY
GLUCOSE-CAPILLARY: 102 mg/dL — AB (ref 65–99)
GLUCOSE-CAPILLARY: 175 mg/dL — AB (ref 65–99)
Glucose-Capillary: 161 mg/dL — ABNORMAL HIGH (ref 65–99)

## 2016-11-19 MED ORDER — MAGNESIUM SULFATE 2 GM/50ML IV SOLN
2.0000 g | Freq: Once | INTRAVENOUS | Status: AC
Start: 2016-11-19 — End: 2016-11-19
  Administered 2016-11-19: 2 g via INTRAVENOUS
  Filled 2016-11-19: qty 50

## 2016-11-19 MED ORDER — CEPHALEXIN 500 MG PO CAPS: 500.0000 mg | ORAL_CAPSULE | Freq: Two times a day (BID) | ORAL | 0 refills | Status: AC

## 2016-11-19 MED ORDER — DOXYCYCLINE HYCLATE 100 MG PO TABS: 100.0000 mg | ORAL_TABLET | Freq: Two times a day (BID) | ORAL | 0 refills | Status: DC

## 2016-11-19 NOTE — Clinical Social Work Note (Signed)
Medical Social Worker facilitated patient discharge including contacting patient family and facility to confirm patient discharge plans.  Clinical information faxed to facility and family agreeable with plan.  MSW arranged ambulance transport via [facility] to [place] and [place].  RN to call report prior to discharge.  Medical Social Worker will sign off for now as social work intervention is no longer needed. Please consult [place] again if new need arises.  Glendon Axe, MSW [phone] 11/19/2016 3:00 PM

## 2016-11-19 NOTE — Discharge Summary (Signed)
Physician Discharge Summary  Heather Zimmerman. [name] VW:[id number] DOB: 1946-10-14 DOA: 11/12/2016  PCP: Gildardo Cranker, DO  Admit date: 11/12/2016 Discharge date: 11/19/2016  Admitted From: SNF  Disposition:  SNF   Recommendations for Outpatient Follow-up:  1. Follow up with PCP  2. Resolution of infection.  3. Check Mag levels, this was supplemented on admission.  Home Health: NO Equipment/Devices: No   Discharge Condition: Stable CODE STATUS: Full  Diet recommendation: Heart Healthy / Carb Modified  HPI-  Chief Complaint: Confusion and fever and chills.  HPI: Heather Zimmerman. Howatt is a 71 y.o. female with history of diastolic CHF, noncompliant with CPAP for sleep apnea, diabetes mellitus, peripheral vascular disease status post right BKA, morbid obesity was brought to the ER after patient was found to be increasingly confused and having fever and chills. In the ER patient was initially hypotensive lactate levels were elevated and patient was febrile. Patient was started on antibiotics and fluids for sepsis protocol. UA shows features consistent with UTI. ABG shows hypercarbia and patient was placed on CPAP. Patient is only responding to her name at this time and does not follow commands. Chest x-ray does not show any definite infiltrates. Patient has a chronic wound on the left lower extremity which is dressed at this time. Patient is being admitted for acute encephalopathy with sepsis and hypercarbia.  ED Course: Patient was started on empiric antibiotics for sepsis and placed on CPAP for hypercarbic respiratory failure. Patient was given fluid bolus for sepsis.   Discharge Diagnoses:  Principal Problem:   Sepsis ([place]) Active Problems:   Chronic diastolic heart failure (HCC)   OSA on CPAP   Acute encephalopathy   CKD (chronic kidney disease) stage 3, GFR 30-59 ml/min   Chronic respiratory failure with hypercapnia (HCC)   Essential hypertension, benign   Type II diabetes mellitus with  neurological manifestations, uncontrolled ([place])   UTI (urinary tract infection)  Severe Sepsis of urinary source:  With hypotension, systolic in the Q000111Q. urine culture+ ecoli. Improved with hydration.  Blood cultures no growth. Started on broad spectrum antibiotiocs- Me[place]- 11/13/16. This was switched to keflex-11/15/16 based on urine culture and sensitivity results to complete at least 7 days of antibiotics. But patient will continue Keflex for cellulitis of left lower extremity.  2.Acute  On chronic respiratory failure: continue qhs cpap  Secondary to Sepsis and possible obesity hypoventilation syndrome CTA chest negative for Large PE  Consider outpatient sleep study patient doesn't have this already    3.Left thigh pain/edema: likely celluitis, likely secondary to left lower extremity venous insufficiency.  Pain improving. MRSA positive. Patient was started on Doxy 11/15/2016, to complete a total of 10 days of antibiotics, will also continue with Keflex for cellulitis.   4.Chronic left lower leg wound  - non healing - 2/2 to PVD vs Venous insufficiency  Wound care consult seen pt  Xeroform with Kerlix  5.Chronic diastolic CHF - ECHO on Q000111Q EF 60-65% G1DD - continue home torsemide- 30mg  daily, discharge. Check magnesium on follow-up, patient required supplemention on admission  6.Acute on CKD stage III - prerenal - elevated at 1.58 on admission with BUN- 41.  Cr baseline 1.2. Creatinine on discharge 0.96.  Improved with hydration and treating infection. Recheck magnesium and electrolytes on follow-up.  7.Insulin dependent DM type 2 - stable, A1C 7.2 (06/26/2016)  She is on SSI and hypoglycemia protocol Lower dose lantus restarted  8. Vaginal yeast infection, Yeast under large abdominal panniculus. VAginitis  likley 2/2 to antibiotic  use. - Topical nystatin, skin care - Fluconazole x1 dose, might require more, considering DM  9.Acute toxic  encephalopathy - infectious etiology -  resolved  Morbid obesity, osa on cpap Body mass index is 72.5 kg/m.  H/o right aka, baseline wheelchair to bed bound.   Discharge Instructions  Discharge Instructions    Diet - low sodium heart healthy    Complete by:  As directed    Increase activity slowly    Complete by:  As directed      Allergies as of 11/19/2016      Reactions   Ace Inhibitors    unknown      Medication List    TAKE these medications   acetaminophen 650 MG CR tablet Commonly known as:  TYLENOL Take 650 mg by mouth every 6 (six) hours as needed for pain. What changed:  Another medication with the same name was removed. Continue taking this medication, and follow the directions you see here.   antiseptic oral rinse Liqd 15 mLs by Mouth Rinse route every hour as needed for dry mouth.   aspirin 81 MG tablet Take 81 mg by mouth daily.   camphor-menthol lotion Commonly known as:  SARNA Apply 1 application topically every 8 (eight) hours as needed for itching.   cephALEXin 500 MG capsule Commonly known as:  KEFLEX Take 1 capsule (500 mg total) by mouth every 12 (twelve) hours.   cetirizine-pseudoephedrine 5-120 MG tablet Commonly known as:  ZYRTEC-D Take 1 tablet by mouth daily as needed for allergies ((ever 24 hours)).   cholecalciferol 1000 units tablet Commonly known as:  VITAMIN D Take 2,000 Units by mouth daily.   doxycycline 100 MG tablet Commonly known as:  VIBRA-TABS Take 1 tablet (100 mg total) by mouth 2 (two) times daily.   DULoxetine 20 MG capsule Commonly known as:  CYMBALTA Take 40 mg by mouth daily.   feeding supplement (PRO-STAT SUGAR FREE 64) Liqd Take 30 mLs by mouth 3 (three) times daily with meals. For wound healing and skin integrity   fluticasone 50 MCG/ACT nasal spray Commonly known as:  FLONASE Place 2 sprays into both nostrils at bedtime.   Fluticasone-Salmeterol 250-50 MCG/DOSE Aepb Commonly known as:  ADVAIR Inhale 1 puff into the lungs every 12 (twelve)  hours. Reported on 04/25/2016   gabapentin 600 MG tablet Commonly known as:  NEURONTIN Take 600 mg by mouth 2 (two) times daily.   GLUCAGON EMERGENCY 1 MG injection Generic drug:  glucagon Inject 1 mg into the vein once as needed (blood sugar).   insulin aspart 100 UNIT/ML FlexPen Commonly known as:  NOVOLOG FLEXPEN Inject 0-15 Units into the skin 3 (three) times daily with meals. Inject as per sliding scale: if  0-120=0units ; 121-150 = 2 units; 151-200=3 units;201-250 = 5 units; 251-300= 8 units; 301-350= 11 units; 351-400=15 units. What changed:  when to take this  additional instructions   Insulin Glargine 100 UNIT/ML Solostar Pen Commonly known as:  LANTUS SOLOSTAR Inject 26 Units into the skin daily at 10 pm.   ipratropium-albuterol 0.5-2.5 (3) MG/3ML Soln Commonly known as:  DUONEB Take 3 mLs by nebulization every 6 (six) hours as needed (shortness of breath).   magnesium oxide 400 MG tablet Commonly known as:  MAG-OX Take 400 mg by mouth 3 (three) times daily.   MILK OF MAGNESIA 400 MG/5ML suspension Generic drug:  magnesium hydroxide Take 30 mLs by mouth daily as needed for mild constipation.   Crockett  Place 1 drop into both eyes daily.   potassium chloride SA 20 MEQ tablet Commonly known as:  K-DUR,KLOR-CON Take 20 mEq by mouth daily.   torsemide 20 MG tablet Commonly known as:  DEMADEX Take 1.5 tablets (30 mg total) by mouth daily.   vitamin C 500 MG tablet Commonly known as:  ASCORBIC ACID Take 500 mg by mouth 2 (two) times daily.   Zinc Sulfate 110 MG Tabs Take 220 mg by mouth daily.       Allergies  Allergen Reactions  . Ace Inhibitors     unknown    Consultations:  None   Procedures/Studies: Dg Chest 2 View  Result Date: 11/12/2016 CLINICAL DATA:  Shortness of breath.  Fever. EXAM: CHEST  2 VIEW COMPARISON:  Most recent radiographs 09/23/2016 FINDINGS: Improved right lung base opacity without definite residual.  No new focal airspace disease. Slight decrease in cardiomegaly. Again seen vascular congestion. Probable perihilar edema. No pleural fluid. Unchanged osseous structures with chronic changes about shoulders. IMPRESSION: 1. Persistent vascular congestion and mild perihilar edema. Slight decreased cardiomegaly. 2. Improved right lung base opacity.  No new consolidation. Electronically Signed   By: Jeb Levering M.D.   On: 11/12/2016 03:01   Dg Tibia/fibula Left  Result Date: 11/12/2016 CLINICAL DATA:  Fever arm.  Diabetic ulcer. EXAM: LEFT TIBIA AND FIBULA - 2 VIEW COMPARISON:  Tibia/fibula radiographs 06/20/2014 FINDINGS: Tibia and fibula are intact. No bony destructive change to suggest osteomyelitis. Chronic smooth periosteal thickening of the fibula appears chronic and unchanged and may be related to venous stasis. Soft tissue calcifications about the anterior lateral calf again seen, unchanged. Scattered phleboliths. IMPRESSION: No acute osseous abnormality or radiographic evidence of osteomyelitis. Stable soft tissue calcifications. Electronically Signed   By: Jeb Levering M.D.   On: 11/12/2016 06:11   Ct Head Wo Contrast  Result Date: 11/12/2016 CLINICAL DATA:  Acute encephalopathy. Altered mental status. Fever. EXAM: CT HEAD WITHOUT CONTRAST TECHNIQUE: Contiguous axial images were obtained from the base of the skull through the vertex without intravenous contrast. COMPARISON:  Head CT 07/07/2016 FINDINGS: Brain: No evidence of acute infarction, hemorrhage, hydrocephalus, extra-axial collection or mass lesion/mass effect. Mild motion artifact. Vascular: Atherosclerosis of skullbase vasculature without hyperdense vessel or abnormal calcification. Skull: Normal. Negative for fracture or focal lesion. Sinuses/Orbits: Remote right orbital fracture. Chronic right maxillary sinus opacification. Other: None. IMPRESSION: No acute intracranial abnormality. Electronically Signed   By: Jeb Levering M.D.    On: 11/12/2016 06:29   Ct Angio Chest Pe W Or Wo Contrast  Result Date: 11/12/2016 CLINICAL DATA:  Sepsis, hypercarbia, acute encephalopathy. Probable UTI. EXAM: CT ANGIOGRAPHY CHEST WITH CONTRAST TECHNIQUE: Multidetector CT imaging of the chest was performed using the standard protocol during bolus administration of intravenous contrast. Multiplanar CT image reconstructions and MIPs were obtained to evaluate the vascular anatomy. CONTRAST:  80 mL Isovue 370 intravenous COMPARISON:  Radiographs 11/12/2016 FINDINGS: Cardiovascular: Suboptimal opacification of the pulmonary vasculature for exclusion of small pulmonary emboli. No large central emboli are present. Mild cardiomegaly. No pericardial effusion. Thoracic aorta is normal in caliber and intact. Mediastinum/Nodes: Moderately prominent nodes throughout the mediastinum and hila, nonspecific. Lungs/Pleura: Focal thickening of the minor fissure measuring 5 x 11 mm. No airspace consolidation. No pleural effusions. Airways are patent. Upper Abdomen: No acute abnormality. Musculoskeletal: No significant skeletal lesion. Review of the MIP images confirms the above findings. IMPRESSION: 1. No large central pulmonary embolus. Suboptimal evaluation for exclusion of small emboli. 2. Nonspecific mediastinal and  hilar nodes. 3. No airspace consolidation.  No effusions. Electronically Signed   By: Andreas Newport M.D.   On: 11/12/2016 19:21   Dg Chest Port 1 View  Result Date: 11/13/2016 CLINICAL DATA:  Hypoxia. EXAM: PORTABLE CHEST 1 VIEW COMPARISON:  CT 11/12/2016.  Chest x-ray 11/12/2016 . FINDINGS: Cardiomegaly with pulmonary vascular prominence and mild bilateral interstitial prominence consistent with mild CHF. No pneumothorax. No acute bony abnormality. IMPRESSION: Cardiomegaly with pulmonary vascular prominence and bilateral mild interstitial prominence. Mild CHF cannot be excluded. Electronically Signed   By: Marcello Moores  Register   On: 11/13/2016 07:57   Dg  Foot 2 Views Left  Result Date: 11/12/2016 CLINICAL DATA:  Fever.  Diabetic foot ulcer. EXAM: LEFT FOOT - 2 VIEW COMPARISON:  None. FINDINGS: No bony destructive change to suggest osteomyelitis. There is pes planus with loss of normal Boehler's angle. Mild hammertoe deformity of the digits. Mild scattered osteoarthritis. Mild soft tissue edema. Site of clinical ulcers not well seen radiographically. IMPRESSION: No radiographic evidence of osteomyelitis. Pes planus and scattered osteoarthritis. Electronically Signed   By: Jeb Levering M.D.   On: 11/12/2016 06:13   [place] Lt Lower Extrem Ltd Soft Tissue Non Vascular  Result Date: 11/14/2016 CLINICAL DATA:  Left thigh pain. EXAM: ULTRASOUND LEFT LOWER EXTREMITY LIMITED TECHNIQUE: Ultrasound examination of the lower extremity soft tissues was performed in the area of clinical concern. COMPARISON:  11/12/2016 . FINDINGS: Diffuse soft tissue edema. No focal definitive fluid collection or solid abnormality identified. IMPRESSION: Diffuse edema. No focal abnormality identified. Further evaluation with MRI can be obtained as needed . Electronically Signed   By: Marcello Moores  Register   On: 11/14/2016 13:50      Subjective: left lower extremity swelling and pain has improved. Minimal tenderness on palpation. Ready for discharge   Discharge Exam: Vitals:   11/19/16 0552 11/19/16 1302  BP: 133/73 (P) 131/66  Pulse: 71 (P) 72  Resp: 18 (P) 19  Temp: 98.6 F (37 C)    Vitals:   11/18/16 2048 11/19/16 0552 11/19/16 0929 11/19/16 1302  BP: 111/66 133/73  (P) 131/66  Pulse: 78 71  (P) 72  Resp: 20 18  (P) 19  Temp: 98 F (36.7 C) 98.6 F (37 C)    TempSrc: Oral Axillary  (P) Oral  SpO2: 96% 98% 100% (P) 100%  Weight:      Height:        General: Pt is alert, awake, not in acute distress Cardiovascular: RRR, S1/S2 +, no rubs, no gallops Respiratory: CTA bilaterally, no wheezing, no rhonchi Abdominal: Soft, NT, ND, bowel sounds + Extremities:  bilateral venostasis dermatitis was on the left. Induration with nondrainingchronic healing ulcers on the left lower extremity  The results of significant diagnostics from this hospitalization (including imaging, microbiology, ancillary and laboratory) are listed below for reference.     Microbiology: Recent Results (from the past 240 hour(s))  Culture, blood (Routine x 2)     Status: None   Collection Time: 11/12/16  2:00 AM  Result Value Ref Range Status   Specimen Description BLOOD LEFT ANTECUBITAL  Final   Special Requests BOTTLES DRAWN AEROBIC AND ANAEROBIC 5ML  Final   Culture   Final    NO GROWTH 5 DAYS Performed at [facility], [address], [place], [place] [address]    Report Status 11/17/2016 FINAL  Final  Urine culture     Status: Abnormal   Collection Time: 11/12/16  2:29 AM  Result Value  Ref Range Status   Specimen Description URINE, RANDOM  Final   Special Requests NONE  Final   Culture >=100,000 COLONIES/mL ESCHERICHIA COLI (A)  Final   Report Status 11/15/2016 FINAL  Final   Organism ID, Bacteria ESCHERICHIA COLI (A)  Final      Susceptibility   Escherichia coli - MIC*    AMPICILLIN >=32 RESISTANT Resistant     CEFAZOLIN <=4 SENSITIVE Sensitive     CEFTRIAXONE <=1 SENSITIVE Sensitive     CIPROFLOXACIN >=4 RESISTANT Resistant     GENTAMICIN <=1 SENSITIVE Sensitive     IMIPENEM <=0.25 SENSITIVE Sensitive     NITROFURANTOIN <=16 SENSITIVE Sensitive     TRIMETH/SULFA >=320 RESISTANT Resistant     AMPICILLIN/SULBACTAM 16 INTERMEDIATE Intermediate     PIP/TAZO <=4 SENSITIVE Sensitive     Extended ESBL NEGATIVE Sensitive     * >=100,000 COLONIES/mL ESCHERICHIA COLI  Culture, blood (Routine x 2)     Status: None   Collection Time: 11/12/16  2:30 AM  Result Value Ref Range Status   Specimen Description BLOOD RIGHT SHOULDER  Final   Special Requests IN PEDIATRIC BOTTLE 3ML  Final   Culture   Final    NO GROWTH 5 DAYS Performed at [facility],  [address], [place], [place] [address]    Report Status 11/17/2016 FINAL  Final  MRSA PCR Screening     Status: Abnormal   Collection Time: 11/12/16  2:20 PM  Result Value Ref Range Status   MRSA by PCR POSITIVE (A) NEGATIVE Final    Comment:        The GeneXpert MRSA Assay (FDA approved for NASAL specimens only), is one component of a comprehensive MRSA colonization surveillance program. It is not intended to diagnose MRSA infection nor to guide or monitor treatment for MRSA infections. RESULT CALLED TO, READ BACK BY AND VERIFIED WITH: [place] RN AT 1823 ON 11/12/16 BY S.VANHOORNE      Labs: BNP (last 3 results)  Recent Labs  07/07/16 1650 07/19/16 2320 09/23/16 2354  BNP 39.7 51.1 0000000*   Basic Metabolic Panel:  Recent Labs Lab 11/13/16 0355 11/14/16 0358 11/15/16 0404 11/16/16 0604 11/17/16 0523 11/19/16 0525  NA 143 146* 144 142 139  --   K 3.9 3.9 4.1 3.7 3.8  --   CL 107 113* 111 105 103  --   CO2 29 29 28 31 31   --   GLUCOSE 102* 127* 156* 123* 112*  --   BUN 33* 25* 16 11 11   --   CREATININE 1.49* 1.19* 1.10* 0.94 0.96 1.07*  CALCIUM 7.8* 8.0* 8.5* 8.8* 8.8*  --   MG 1.8  --   --  1.7 1.5* 1.6*  PHOS 2.8  --   --   --   --   --    Liver Function Tests:  Recent Labs Lab 11/12/16 1515 11/13/16 0355  AST QUANTITY NOT SUFFICIENT, UNABLE TO PERFORM TEST 20  ALT 25 21  ALKPHOS 118 88  BILITOT QUANTITY NOT SUFFICIENT, UNABLE TO PERFORM TEST 1.0  PROT QUANTITY NOT SUFFICIENT, UNABLE TO PERFORM TEST 6.7  ALBUMIN QUANTITY NOT SUFFICIENT, UNABLE TO PERFORM TEST 2.1*    Recent Labs Lab 11/13/16 0355  AMMONIA 21   CBC:  Recent Labs Lab 11/12/16 1515 11/13/16 0355 11/14/16 0358 11/15/16 0404 11/16/16 0604  WBC 19.9* 18.4* 11.9* 6.2 6.3  NEUTROABS 18.8* 16.7* 9.9* 4.2  --   HGB 13.6 9.9* 9.4* 8.8* 9.8*  HCT 42.7 32.3* 30.7* 28.7* 31.1*  MCV 89.0 90.5 90.8 90.3 88.6  PLT 180 160 162 150 185   Cardiac Enzymes:  Recent Labs Lab  11/13/16 0355  CKTOTAL 79   CBG:  Recent Labs Lab 11/18/16 1247 11/18/16 1701 11/18/16 2042 11/19/16 0730 11/19/16 1226  GLUCAP 157* 151* 126* 102* 161*   Urinalysis    Component Value Date/Time   COLORURINE YELLOW 11/12/2016 0229   APPEARANCEUR TURBID (A) 11/12/2016 0229   LABSPEC 1.014 11/12/2016 0229   PHURINE 5.0 11/12/2016 0229   GLUCOSEU NEGATIVE 11/12/2016 0229   HGBUR SMALL (A) 11/12/2016 0229   BILIRUBINUR NEGATIVE 11/12/2016 0229   KETONESUR 5 (A) 11/12/2016 0229   PROTEINUR 100 (A) 11/12/2016 0229   UROBILINOGEN 0.2 07/18/2015 0609   NITRITE NEGATIVE 11/12/2016 0229   LEUKOCYTESUR MODERATE (A) 11/12/2016 0229   Sepsis Labs Invalid input(s): PROCALCITONIN,  WBC,  LACTICIDVEN Microbiology Recent Results (from the past 240 hour(s))  Culture, blood (Routine x 2)     Status: None   Collection Time: 11/12/16  2:00 AM  Result Value Ref Range Status   Specimen Description BLOOD LEFT ANTECUBITAL  Final   Special Requests BOTTLES DRAWN AEROBIC AND ANAEROBIC 5ML  Final   Culture   Final    NO GROWTH 5 DAYS Performed at [facility], [place] [address] [place], [place] [address]    Report Status 11/17/2016 FINAL  Final  Urine culture     Status: Abnormal   Collection Time: 11/12/16  2:29 AM  Result Value Ref Range Status   Specimen Description URINE, RANDOM  Final   Special Requests NONE  Final   Culture >=100,000 COLONIES/mL ESCHERICHIA COLI (A)  Final   Report Status 11/15/2016 FINAL  Final   Organism ID, Bacteria ESCHERICHIA COLI (A)  Final      Susceptibility   Escherichia coli - MIC*    AMPICILLIN >=32 RESISTANT Resistant     CEFAZOLIN <=4 SENSITIVE Sensitive     CEFTRIAXONE <=1 SENSITIVE Sensitive     CIPROFLOXACIN >=4 RESISTANT Resistant     GENTAMICIN <=1 SENSITIVE Sensitive     IMIPENEM <=0.25 SENSITIVE Sensitive     NITROFURANTOIN <=16 SENSITIVE Sensitive     TRIMETH/SULFA >=320 RESISTANT Resistant     AMPICILLIN/SULBACTAM 16 INTERMEDIATE  Intermediate     PIP/TAZO <=4 SENSITIVE Sensitive     Extended ESBL NEGATIVE Sensitive     * >=100,000 COLONIES/mL ESCHERICHIA COLI  Culture, blood (Routine x 2)     Status: None   Collection Time: 11/12/16  2:30 AM  Result Value Ref Range Status   Specimen Description BLOOD RIGHT SHOULDER  Final   Special Requests IN PEDIATRIC BOTTLE 3ML  Final   Culture   Final    NO GROWTH 5 DAYS Performed at [facility], [address], [place], [place] [address]    Report Status 11/17/2016 FINAL  Final  MRSA PCR Screening     Status: Abnormal   Collection Time: 11/12/16  2:20 PM  Result Value Ref Range Status   MRSA by PCR POSITIVE (A) NEGATIVE Final    Comment:        The GeneXpert MRSA Assay (FDA approved for NASAL specimens only), is one component of a comprehensive MRSA colonization surveillance program. It is not intended to diagnose MRSA infection nor to guide or monitor treatment for MRSA infections. RESULT CALLED TO, READ BACK BY AND VERIFIED WITH: [place] RN AT [id number] ON 11/12/16 BY S.VANHOORNE  Time coordinating discharge: Over 30 minutes  SIGNED:   Bethena Roys, MD  [facility] 11/19/2016, 2:38 PM Pager [phone]  If 7PM-7AM, please contact night-coverage [email or web address] Password [email or web address]

## 2016-11-19 NOTE — Progress Notes (Signed)
Report called to [facility], spoke with Amalia Hailey. She is familiar with pt and is ready for her to return.  Pt updated as well.  Coolidge Breeze, RN 11/19/2016

## 2016-11-19 NOTE — Progress Notes (Signed)
Assumed care of patient at this time.  Agree with previously charted assessment from this shift. Will continue to monitor and assess.  [place] Breeze, RN 11/19/2016

## 2016-11-20 ENCOUNTER — Telehealth: Payer: Self-pay

## 2016-11-20 ENCOUNTER — Other Ambulatory Visit: Payer: Self-pay | Admitting: *Deleted

## 2016-11-20 MED ORDER — CETIRIZINE-PSEUDOEPHEDRINE ER 5-120 MG PO TB12: ORAL_TABLET | ORAL | 0 refills | Status: DC

## 2016-11-20 NOTE — Telephone Encounter (Signed)
[facility] #: [phone] Fax#: [phone]  

## 2016-11-20 NOTE — Telephone Encounter (Signed)
Possible re-admission to facility. This is a patient you were seeing at [facility]. [facility] F/U is needed if patient was re-admitted to facility upon discharge. Hospital discharge from [facility] on 11/19/16.

## 2016-11-21 ENCOUNTER — Non-Acute Institutional Stay (SKILLED_NURSING_FACILITY): Payer: Medicare Other | Admitting: Adult Health

## 2016-11-21 DIAGNOSIS — I509 Heart failure, unspecified: Secondary | ICD-10-CM

## 2016-11-21 DIAGNOSIS — G4733 Obstructive sleep apnea (adult) (pediatric): Secondary | ICD-10-CM

## 2016-11-21 DIAGNOSIS — I130 Hypertensive heart and chronic kidney disease with heart failure and stage 1 through stage 4 chronic kidney disease, or unspecified chronic kidney disease: Secondary | ICD-10-CM

## 2016-11-21 DIAGNOSIS — I5032 Chronic diastolic (congestive) heart failure: Secondary | ICD-10-CM

## 2016-11-21 DIAGNOSIS — J9611 Chronic respiratory failure with hypoxia: Secondary | ICD-10-CM | POA: Diagnosis not present

## 2016-11-21 DIAGNOSIS — I83029 Varicose veins of left lower extremity with ulcer of unspecified site: Secondary | ICD-10-CM | POA: Diagnosis not present

## 2016-11-21 DIAGNOSIS — N183 Chronic kidney disease, stage 3 (moderate): Secondary | ICD-10-CM

## 2016-11-21 DIAGNOSIS — L03116 Cellulitis of left lower limb: Secondary | ICD-10-CM | POA: Diagnosis not present

## 2016-11-21 DIAGNOSIS — E1140 Type 2 diabetes mellitus with diabetic neuropathy, unspecified: Secondary | ICD-10-CM

## 2016-11-21 DIAGNOSIS — L97929 Non-pressure chronic ulcer of unspecified part of left lower leg with unspecified severity: Secondary | ICD-10-CM

## 2016-11-21 DIAGNOSIS — E1143 Type 2 diabetes mellitus with diabetic autonomic (poly)neuropathy: Secondary | ICD-10-CM

## 2016-11-21 DIAGNOSIS — J9622 Acute and chronic respiratory failure with hypercapnia: Secondary | ICD-10-CM

## 2016-11-21 DIAGNOSIS — Z89511 Acquired absence of right leg below knee: Secondary | ICD-10-CM | POA: Diagnosis not present

## 2016-11-21 DIAGNOSIS — IMO0002 Reserved for concepts with insufficient information to code with codable children: Secondary | ICD-10-CM

## 2016-11-21 DIAGNOSIS — Z9989 Dependence on other enabling machines and devices: Secondary | ICD-10-CM | POA: Diagnosis not present

## 2016-11-21 DIAGNOSIS — Z794 Long term (current) use of insulin: Secondary | ICD-10-CM

## 2016-11-21 DIAGNOSIS — E1165 Type 2 diabetes mellitus with hyperglycemia: Secondary | ICD-10-CM

## 2016-11-22 LAB — BASIC METABOLIC PANEL
BUN: 35 mg/dL — AB (ref 4–21)
CREATININE: 1.1 mg/dL (ref 0.5–1.1)
Glucose: 103 mg/dL
Potassium: 4.5 mmol/L (ref 3.4–5.3)
Sodium: 141 mmol/L (ref 137–147)

## 2016-11-22 LAB — HEPATIC FUNCTION PANEL
ALK PHOS: 109 U/L (ref 25–125)
ALT: 16 U/L (ref 7–35)
AST: 19 U/L (ref 13–35)
Bilirubin, Total: 0.4 mg/dL

## 2016-11-22 LAB — LIPID PANEL
CHOLESTEROL: 157 mg/dL (ref 0–200)
HDL: 34 mg/dL — AB (ref 35–70)
LDL CALC: 101 mg/dL
TRIGLYCERIDES: 108 mg/dL (ref 40–160)

## 2016-11-22 LAB — CBC AND DIFFERENTIAL
HEMATOCRIT: 36 % (ref 36–46)
HEMOGLOBIN: 10.9 g/dL — AB (ref 12.0–16.0)
PLATELETS: 257 10*3/uL (ref 150–399)
WBC: 4.6 10*3/mL

## 2016-11-26 ENCOUNTER — Encounter: Payer: Self-pay | Admitting: Internal Medicine

## 2016-11-26 ENCOUNTER — Non-Acute Institutional Stay (SKILLED_NURSING_FACILITY): Payer: Medicare Other | Admitting: Internal Medicine

## 2016-11-26 DIAGNOSIS — I872 Venous insufficiency (chronic) (peripheral): Secondary | ICD-10-CM | POA: Diagnosis not present

## 2016-11-26 DIAGNOSIS — L97929 Non-pressure chronic ulcer of unspecified part of left lower leg with unspecified severity: Secondary | ICD-10-CM

## 2016-11-26 DIAGNOSIS — J9612 Chronic respiratory failure with hypercapnia: Secondary | ICD-10-CM

## 2016-11-26 DIAGNOSIS — I5032 Chronic diastolic (congestive) heart failure: Secondary | ICD-10-CM

## 2016-11-26 DIAGNOSIS — G4733 Obstructive sleep apnea (adult) (pediatric): Secondary | ICD-10-CM

## 2016-11-26 DIAGNOSIS — Z89511 Acquired absence of right leg below knee: Secondary | ICD-10-CM

## 2016-11-26 DIAGNOSIS — E1143 Type 2 diabetes mellitus with diabetic autonomic (poly)neuropathy: Secondary | ICD-10-CM | POA: Diagnosis not present

## 2016-11-26 DIAGNOSIS — I83029 Varicose veins of left lower extremity with ulcer of unspecified site: Secondary | ICD-10-CM | POA: Diagnosis not present

## 2016-11-26 DIAGNOSIS — I10 Essential (primary) hypertension: Secondary | ICD-10-CM | POA: Diagnosis not present

## 2016-11-26 NOTE — Progress Notes (Signed)
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 11/12/2016 CLINICAL DATA:  Sepsis, hypercarbia, acute encephalopathy. Probable UTI. EXAM: CT ANGIOGRAPHY CHEST WITH CONTRAST TECHNIQUE: Multidetector CT imaging of the chest was performed using the standard protocol during bolus administration of intravenous contrast. Multiplanar CT image reconstructions and MIPs were obtained to evaluate the vascular anatomy. CONTRAST:  80 mL Isovue 370 intravenous COMPARISON:  Radiographs 11/12/2016 FINDINGS: Cardiovascular: Suboptimal opacification of the pulmonary vasculature for exclusion of small pulmonary emboli. No large central emboli are present. Mild cardiomegaly. No pericardial effusion. Thoracic aorta is normal in caliber and intact.  Mediastinum/Nodes: Moderately prominent nodes throughout the mediastinum and hila, nonspecific. Lungs/Pleura: Focal thickening of the minor fissure measuring 5 x 11 mm. No airspace consolidation. No pleural effusions. Airways are patent. Upper Abdomen: No acute abnormality. Musculoskeletal: No significant skeletal lesion. Review of the MIP images confirms the above findings. IMPRESSION: 1. No large central pulmonary embolus. Suboptimal evaluation for exclusion of small emboli. 2. Nonspecific mediastinal and hilar nodes. 3. No airspace consolidation.  No effusions. Electronically Signed   By: Andreas Newport M.D.   On: 11/12/2016 19:21   Dg Chest Port 1 View  Result Date: 11/13/2016 CLINICAL DATA:  Hypoxia. EXAM: PORTABLE CHEST 1 VIEW COMPARISON:  CT 11/12/2016.  Chest x-ray 11/12/2016 . FINDINGS: Cardiomegaly with pulmonary vascular prominence and mild bilateral interstitial prominence consistent with mild CHF. No pneumothorax. No acute bony abnormality. IMPRESSION: Cardiomegaly with pulmonary vascular prominence and bilateral mild interstitial prominence. Mild CHF cannot be excluded. Electronically Signed   By: Marcello Moores  Register   On: 11/13/2016 07:57   Dg Foot 2 Views Left  Result Date: 11/12/2016 CLINICAL DATA:  Fever.  Diabetic foot ulcer. EXAM: LEFT FOOT - 2 VIEW COMPARISON:  None. FINDINGS: No bony destructive change to suggest osteomyelitis. There is pes planus with loss of normal Boehler's angle. Mild hammertoe deformity of the digits. Mild scattered osteoarthritis. Mild soft tissue edema. Site of clinical ulcers not well seen radiographically. IMPRESSION: No radiographic evidence of osteomyelitis. Pes planus and scattered osteoarthritis. Electronically Signed   By: Jeb Levering M.D.   On: 11/12/2016 06:13   [place] Lt Lower Extrem Ltd Soft Tissue Non Vascular  Result Date: 11/14/2016 CLINICAL DATA:  Left thigh pain. EXAM: ULTRASOUND LEFT LOWER EXTREMITY LIMITED TECHNIQUE: Ultrasound examination  of the lower extremity soft tissues was performed in the area of clinical concern. COMPARISON:  11/12/2016 . FINDINGS: Diffuse soft tissue edema. No focal definitive fluid collection or solid abnormality identified. IMPRESSION: Diffuse edema. No focal abnormality identified. Further evaluation with MRI can be obtained as needed . Electronically Signed   By: Marcello Moores  Register   On: 11/14/2016 13:50     Assessment/Plan   ICD-9-CM ICD-10-CM   1. Chronic diastolic heart failure (HCC) - improved volume status but has hx noncompliance with fluid restriction 428.32 I50.32   2. Diabetic autonomic neuropathy associated  with type 2 diabetes mellitus ([place]) 250.60 E11.43    337.1    3. Chronic venous stasis dermatitis 454.1 I87.2   4. Chronic respiratory failure with hypercapnia (HCC) 518.83 J96.12   5. Essential hypertension, benign 401.1 I10   6. Hx of right BKA ([place]) V49.75 Z89.511   7. Venous ulcer of left leg (HCC) 454.0 I83.029   8. Morbid obesity, unspecified obesity type ([place]) 278.01 E66.01   9. Obstructive sleep apnea - noncompliant with CPAP 327.23 G47.33    Check Mg level and A1c  Fluid restrict 1800 cc per day due to CHF  Daily weights and record  Cont current meds as ordered  Encouraged her to use CPAP qHS as ordered  PT/OT/ST as ordered  F/u with specialists as scheduled  GOAL: short term rehab then continue long term care. Communicated with pt and nursing.  Will follow   Carlos Heber S. Perlie Gold  [facility] [address] [place], [place] [address] [phone] Cell (Monday-Friday 8 AM - 5 PM) [phone] After 5 PM and follow prompts

## 2016-11-27 LAB — HEMOGLOBIN A1C: HEMOGLOBIN A1C: 7

## 2016-12-03 DIAGNOSIS — I83028 Varicose veins of left lower extremity with ulcer other part of lower leg: Secondary | ICD-10-CM | POA: Diagnosis not present

## 2016-12-03 DIAGNOSIS — I83023 Varicose veins of left lower extremity with ulcer of ankle: Secondary | ICD-10-CM | POA: Diagnosis not present

## 2016-12-10 DIAGNOSIS — I83028 Varicose veins of left lower extremity with ulcer other part of lower leg: Secondary | ICD-10-CM | POA: Diagnosis not present

## 2016-12-17 DIAGNOSIS — I83022 Varicose veins of left lower extremity with ulcer of calf: Secondary | ICD-10-CM | POA: Diagnosis not present

## 2016-12-19 ENCOUNTER — Non-Acute Institutional Stay (SKILLED_NURSING_FACILITY): Payer: Medicare Other | Admitting: Adult Health

## 2016-12-19 ENCOUNTER — Encounter: Payer: Self-pay | Admitting: Adult Health

## 2016-12-19 DIAGNOSIS — Z89511 Acquired absence of right leg below knee: Secondary | ICD-10-CM | POA: Diagnosis not present

## 2016-12-19 DIAGNOSIS — G4733 Obstructive sleep apnea (adult) (pediatric): Secondary | ICD-10-CM

## 2016-12-19 DIAGNOSIS — I5032 Chronic diastolic (congestive) heart failure: Secondary | ICD-10-CM | POA: Diagnosis not present

## 2016-12-19 DIAGNOSIS — I130 Hypertensive heart and chronic kidney disease with heart failure and stage 1 through stage 4 chronic kidney disease, or unspecified chronic kidney disease: Secondary | ICD-10-CM | POA: Diagnosis not present

## 2016-12-19 DIAGNOSIS — J9612 Chronic respiratory failure with hypercapnia: Secondary | ICD-10-CM

## 2016-12-19 DIAGNOSIS — G894 Chronic pain syndrome: Secondary | ICD-10-CM

## 2016-12-19 DIAGNOSIS — I509 Heart failure, unspecified: Secondary | ICD-10-CM

## 2016-12-19 DIAGNOSIS — N183 Chronic kidney disease, stage 3 unspecified: Secondary | ICD-10-CM

## 2016-12-19 DIAGNOSIS — E1165 Type 2 diabetes mellitus with hyperglycemia: Secondary | ICD-10-CM

## 2016-12-19 DIAGNOSIS — E1140 Type 2 diabetes mellitus with diabetic neuropathy, unspecified: Secondary | ICD-10-CM | POA: Diagnosis not present

## 2016-12-19 DIAGNOSIS — Z794 Long term (current) use of insulin: Secondary | ICD-10-CM

## 2016-12-19 DIAGNOSIS — E1143 Type 2 diabetes mellitus with diabetic autonomic (poly)neuropathy: Secondary | ICD-10-CM

## 2016-12-19 DIAGNOSIS — IMO0002 Reserved for concepts with insufficient information to code with codable children: Secondary | ICD-10-CM
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
## 2016-12-24 DIAGNOSIS — I83023 Varicose veins of left lower extremity with ulcer of ankle: Secondary | ICD-10-CM | POA: Diagnosis not present

## 2016-12-28 ENCOUNTER — Emergency Department (HOSPITAL_COMMUNITY): Payer: Medicare Other

## 2016-12-28 ENCOUNTER — Inpatient Hospital Stay (HOSPITAL_COMMUNITY)
Admission: EM | Admit: 2016-12-28 | Discharge: 2017-01-03 | DRG: 871 | Disposition: A | Discharge status: Skilled Nursing Facility | Payer: Medicare Other | Attending: Family Medicine | Admitting: Family Medicine

## 2016-12-28 ENCOUNTER — Encounter (HOSPITAL_COMMUNITY): Payer: Self-pay

## 2016-12-28 DIAGNOSIS — L03116 Cellulitis of left lower limb: Secondary | ICD-10-CM

## 2016-12-28 DIAGNOSIS — R930 Abnormal findings on diagnostic imaging of skull and head, not elsewhere classified: Secondary | ICD-10-CM | POA: Diagnosis not present

## 2016-12-28 DIAGNOSIS — E1151 Type 2 diabetes mellitus with diabetic peripheral angiopathy without gangrene: Secondary | ICD-10-CM | POA: Diagnosis present

## 2016-12-28 DIAGNOSIS — Z7951 Long term (current) use of inhaled steroids: Secondary | ICD-10-CM

## 2016-12-28 DIAGNOSIS — K219 Gastro-esophageal reflux disease without esophagitis: Secondary | ICD-10-CM | POA: Diagnosis not present

## 2016-12-28 DIAGNOSIS — J9622 Acute and chronic respiratory failure with hypercapnia: Secondary | ICD-10-CM | POA: Diagnosis not present

## 2016-12-28 DIAGNOSIS — D631 Anemia in chronic kidney disease: Secondary | ICD-10-CM | POA: Diagnosis present

## 2016-12-28 DIAGNOSIS — Z6841 Body Mass Index (BMI) 40.0 and over, adult: Secondary | ICD-10-CM

## 2016-12-28 DIAGNOSIS — I739 Peripheral vascular disease, unspecified: Secondary | ICD-10-CM | POA: Diagnosis present

## 2016-12-28 DIAGNOSIS — E872 Acidosis: Secondary | ICD-10-CM | POA: Diagnosis present

## 2016-12-28 DIAGNOSIS — I509 Heart failure, unspecified: Secondary | ICD-10-CM | POA: Diagnosis present

## 2016-12-28 DIAGNOSIS — E662 Morbid (severe) obesity with alveolar hypoventilation: Secondary | ICD-10-CM | POA: Diagnosis present

## 2016-12-28 DIAGNOSIS — L03115 Cellulitis of right lower limb: Secondary | ICD-10-CM | POA: Diagnosis not present

## 2016-12-28 DIAGNOSIS — E1122 Type 2 diabetes mellitus with diabetic chronic kidney disease: Secondary | ICD-10-CM | POA: Diagnosis present

## 2016-12-28 DIAGNOSIS — E1143 Type 2 diabetes mellitus with diabetic autonomic (poly)neuropathy: Secondary | ICD-10-CM | POA: Diagnosis present

## 2016-12-28 DIAGNOSIS — E1165 Type 2 diabetes mellitus with hyperglycemia: Secondary | ICD-10-CM | POA: Diagnosis present

## 2016-12-28 DIAGNOSIS — E1149 Type 2 diabetes mellitus with other diabetic neurological complication: Secondary | ICD-10-CM | POA: Diagnosis present

## 2016-12-28 DIAGNOSIS — G9340 Encephalopathy, unspecified: Secondary | ICD-10-CM | POA: Diagnosis present

## 2016-12-28 DIAGNOSIS — A419 Sepsis, unspecified organism: Secondary | ICD-10-CM | POA: Diagnosis not present

## 2016-12-28 DIAGNOSIS — R404 Transient alteration of awareness: Secondary | ICD-10-CM

## 2016-12-28 DIAGNOSIS — Z794 Long term (current) use of insulin: Secondary | ICD-10-CM

## 2016-12-28 DIAGNOSIS — L0390 Cellulitis, unspecified: Secondary | ICD-10-CM

## 2016-12-28 DIAGNOSIS — I872 Venous insufficiency (chronic) (peripheral): Secondary | ICD-10-CM | POA: Diagnosis present

## 2016-12-28 DIAGNOSIS — I130 Hypertensive heart and chronic kidney disease with heart failure and stage 1 through stage 4 chronic kidney disease, or unspecified chronic kidney disease: Secondary | ICD-10-CM | POA: Diagnosis present

## 2016-12-28 DIAGNOSIS — F329 Major depressive disorder, single episode, unspecified: Secondary | ICD-10-CM | POA: Diagnosis present

## 2016-12-28 DIAGNOSIS — R4182 Altered mental status, unspecified: Secondary | ICD-10-CM | POA: Diagnosis not present

## 2016-12-28 DIAGNOSIS — R7989 Other specified abnormal findings of blood chemistry: Secondary | ICD-10-CM | POA: Diagnosis not present

## 2016-12-28 DIAGNOSIS — I2781 Cor pulmonale (chronic): Secondary | ICD-10-CM | POA: Diagnosis not present

## 2016-12-28 DIAGNOSIS — J9621 Acute and chronic respiratory failure with hypoxia: Secondary | ICD-10-CM

## 2016-12-28 DIAGNOSIS — J45909 Unspecified asthma, uncomplicated: Secondary | ICD-10-CM | POA: Diagnosis present

## 2016-12-28 DIAGNOSIS — N179 Acute kidney failure, unspecified: Secondary | ICD-10-CM | POA: Diagnosis present

## 2016-12-28 DIAGNOSIS — L89151 Pressure ulcer of sacral region, stage 1: Secondary | ICD-10-CM | POA: Diagnosis present

## 2016-12-28 DIAGNOSIS — Z9119 Patient's noncompliance with other medical treatment and regimen: Secondary | ICD-10-CM

## 2016-12-28 DIAGNOSIS — Z9981 Dependence on supplemental oxygen: Secondary | ICD-10-CM

## 2016-12-28 DIAGNOSIS — R0603 Acute respiratory distress: Secondary | ICD-10-CM | POA: Diagnosis not present

## 2016-12-28 DIAGNOSIS — R4781 Slurred speech: Secondary | ICD-10-CM | POA: Diagnosis not present

## 2016-12-28 DIAGNOSIS — R6521 Severe sepsis with septic shock: Secondary | ICD-10-CM | POA: Diagnosis present

## 2016-12-28 DIAGNOSIS — Z89511 Acquired absence of right leg below knee: Secondary | ICD-10-CM

## 2016-12-28 DIAGNOSIS — J9601 Acute respiratory failure with hypoxia: Secondary | ICD-10-CM | POA: Diagnosis not present

## 2016-12-28 DIAGNOSIS — Z7982 Long term (current) use of aspirin: Secondary | ICD-10-CM

## 2016-12-28 DIAGNOSIS — I6789 Other cerebrovascular disease: Secondary | ICD-10-CM | POA: Diagnosis not present

## 2016-12-28 DIAGNOSIS — E119 Type 2 diabetes mellitus without complications: Secondary | ICD-10-CM | POA: Diagnosis not present

## 2016-12-28 DIAGNOSIS — R6520 Severe sepsis without septic shock: Secondary | ICD-10-CM | POA: Diagnosis not present

## 2016-12-28 DIAGNOSIS — M6281 Muscle weakness (generalized): Secondary | ICD-10-CM | POA: Diagnosis not present

## 2016-12-28 DIAGNOSIS — J9690 Respiratory failure, unspecified, unspecified whether with hypoxia or hypercapnia: Secondary | ICD-10-CM | POA: Diagnosis present

## 2016-12-28 DIAGNOSIS — I5032 Chronic diastolic (congestive) heart failure: Secondary | ICD-10-CM | POA: Diagnosis not present

## 2016-12-28 DIAGNOSIS — I7389 Other specified peripheral vascular diseases: Secondary | ICD-10-CM | POA: Diagnosis not present

## 2016-12-28 DIAGNOSIS — R791 Abnormal coagulation profile: Secondary | ICD-10-CM | POA: Diagnosis not present

## 2016-12-28 DIAGNOSIS — N183 Chronic kidney disease, stage 3 (moderate): Secondary | ICD-10-CM | POA: Diagnosis present

## 2016-12-28 DIAGNOSIS — J80 Acute respiratory distress syndrome: Secondary | ICD-10-CM | POA: Diagnosis not present

## 2016-12-28 DIAGNOSIS — I87312 Chronic venous hypertension (idiopathic) with ulcer of left lower extremity: Secondary | ICD-10-CM | POA: Diagnosis not present

## 2016-12-28 DIAGNOSIS — J9602 Acute respiratory failure with hypercapnia: Secondary | ICD-10-CM | POA: Diagnosis not present

## 2016-12-28 DIAGNOSIS — J9620 Acute and chronic respiratory failure, unspecified whether with hypoxia or hypercapnia: Secondary | ICD-10-CM | POA: Diagnosis not present

## 2016-12-28 DIAGNOSIS — G8929 Other chronic pain: Secondary | ICD-10-CM | POA: Diagnosis not present

## 2016-12-28 DIAGNOSIS — N390 Urinary tract infection, site not specified: Secondary | ICD-10-CM | POA: Diagnosis not present

## 2016-12-28 LAB — URINALYSIS, ROUTINE W REFLEX MICROSCOPIC
BILIRUBIN URINE: NEGATIVE
GLUCOSE, UA: NEGATIVE mg/dL
HGB URINE DIPSTICK: NEGATIVE
Ketones, ur: NEGATIVE mg/dL
Nitrite: NEGATIVE
PH: 5 (ref 5.0–8.0)
Protein, ur: NEGATIVE mg/dL
SPECIFIC GRAVITY, URINE: 1.009 (ref 1.005–1.030)

## 2016-12-28 LAB — COMPREHENSIVE METABOLIC PANEL
ALK PHOS: 167 U/L — AB (ref 38–126)
ALT: 22 U/L (ref 14–54)
AST: 34 U/L (ref 15–41)
Albumin: 3.1 g/dL — ABNORMAL LOW (ref 3.5–5.0)
Anion gap: 11 (ref 5–15)
BILIRUBIN TOTAL: 1.2 mg/dL (ref 0.3–1.2)
BUN: 33 mg/dL — AB (ref 6–20)
CALCIUM: 9.5 mg/dL (ref 8.9–10.3)
CO2: 32 mmol/L (ref 22–32)
CREATININE: 1.51 mg/dL — AB (ref 0.44–1.00)
Chloride: 98 mmol/L — ABNORMAL LOW (ref 101–111)
GFR calc Af Amer: 39 mL/min — ABNORMAL LOW (ref >60)
GFR, EST NON AFRICAN AMERICAN: 34 mL/min — AB (ref >60)
Glucose, Bld: 183 mg/dL — ABNORMAL HIGH (ref 65–99)
Potassium: 4.8 mmol/L (ref 3.5–5.1)
Sodium: 141 mmol/L (ref 135–145)
TOTAL PROTEIN: 8.7 g/dL — AB (ref 6.5–8.1)

## 2016-12-28 LAB — I-STAT CHEM 8, ED
BUN: 46 mg/dL — AB (ref 6–20)
BUN: 36 mg/dL — AB (ref 6–20)
CALCIUM ION: 1.14 mmol/L — AB (ref 1.15–1.40)
CHLORIDE: 100 mmol/L — AB (ref 101–111)
CREATININE: 1.5 mg/dL — AB (ref 0.44–1.00)
Calcium, Ion: 1.08 mmol/L — ABNORMAL LOW (ref 1.15–1.40)
Chloride: 98 mmol/L — ABNORMAL LOW (ref 101–111)
Creatinine, Ser: 1.6 mg/dL — ABNORMAL HIGH (ref 0.44–1.00)
GLUCOSE: 194 mg/dL — AB (ref 65–99)
GLUCOSE: 173 mg/dL — AB (ref 65–99)
HCT: 42 % (ref 36.0–46.0)
HCT: 38 % (ref 36.0–46.0)
Hemoglobin: 14.3 g/dL (ref 12.0–15.0)
Hemoglobin: 12.9 g/dL (ref 12.0–15.0)
POTASSIUM: 4.7 mmol/L (ref 3.5–5.1)
Potassium: 4.8 mmol/L (ref 3.5–5.1)
Sodium: 142 mmol/L (ref 135–145)
Sodium: 143 mmol/L (ref 135–145)
TCO2: 38 mmol/L (ref 0–100)
TCO2: 35 mmol/L (ref 0–100)

## 2016-12-28 LAB — I-STAT VENOUS BLOOD GAS, ED
ACID-BASE EXCESS: 11 mmol/L — AB (ref 0.0–2.0)
BICARBONATE: 39.7 mmol/L — AB (ref 20.0–28.0)
O2 SAT: 83 %
TCO2: 42 mmol/L (ref 0–100)
pCO2, Ven: 68.5 mmHg — ABNORMAL HIGH (ref 44.0–60.0)
pH, Ven: 7.371 (ref 7.250–7.430)
pO2, Ven: 51 mmHg — ABNORMAL HIGH (ref 32.0–45.0)

## 2016-12-28 LAB — CBC WITH DIFFERENTIAL/PLATELET
BASOS ABS: 0 10*3/uL (ref 0.0–0.1)
Basophils Relative: 0 %
EOS ABS: 0.1 10*3/uL (ref 0.0–0.7)
EOS PCT: 0 %
HCT: 41.2 % (ref 36.0–46.0)
Hemoglobin: 13 g/dL (ref 12.0–15.0)
LYMPHS ABS: 1.4 10*3/uL (ref 0.7–4.0)
Lymphocytes Relative: 8 %
MCH: 28.8 pg (ref 26.0–34.0)
MCHC: 31.6 g/dL (ref 30.0–36.0)
MCV: 91.2 fL (ref 78.0–100.0)
Monocytes Absolute: 0.8 10*3/uL (ref 0.1–1.0)
Monocytes Relative: 4 %
Neutro Abs: 16 10*3/uL — ABNORMAL HIGH (ref 1.7–7.7)
Neutrophils Relative %: 88 %
PLATELETS: 197 10*3/uL (ref 150–400)
RBC: 4.52 MIL/uL (ref 3.87–5.11)
RDW: 16.6 % — ABNORMAL HIGH (ref 11.5–15.5)
WBC: 18.2 10*3/uL — ABNORMAL HIGH (ref 4.0–10.5)

## 2016-12-28 LAB — I-STAT CG4 LACTIC ACID, ED
Lactic Acid, Venous: 2.41 mmol/L (ref 0.5–1.9)
Lactic Acid, Venous: 1.94 mmol/L (ref 0.5–1.9)

## 2016-12-28 LAB — INFLUENZA PANEL BY PCR (TYPE A & B)
INFLBPCR: NEGATIVE
Influenza A By PCR: NEGATIVE

## 2016-12-28 LAB — CBG MONITORING, ED: Glucose-Capillary: 159 mg/dL — ABNORMAL HIGH (ref 65–99)

## 2016-12-28 MED ORDER — PIPERACILLIN-TAZOBACTAM 3.375 G IVPB 30 MIN
3.3750 g | Freq: Once | INTRAVENOUS | Status: AC
  Administered 2016-12-28: 3.375 g via INTRAVENOUS
  Filled 2016-12-28: qty 50

## 2016-12-28 MED ORDER — SODIUM CHLORIDE 0.9 % IV BOLUS (SEPSIS)
1000.0000 mL | Freq: Once | INTRAVENOUS | Status: AC
  Administered 2016-12-28: 1000 mL via INTRAVENOUS

## 2016-12-28 MED ORDER — PIPERACILLIN-TAZOBACTAM 3.375 G IVPB
3.3750 g | Freq: Three times a day (TID) | INTRAVENOUS | Status: DC
  Administered 2016-12-29 – 2017-01-02 (×13): 3.375 g via INTRAVENOUS
  Filled 2016-12-28 (×17): qty 50

## 2016-12-28 MED ORDER — ENOXAPARIN SODIUM 40 MG/0.4ML ~~LOC~~ SOLN
40.0000 mg | Freq: Every day | SUBCUTANEOUS | Status: DC
  Administered 2016-12-29 – 2017-01-02 (×5): 40 mg via SUBCUTANEOUS
  Filled 2016-12-28 (×5): qty 0.4

## 2016-12-28 MED ORDER — VANCOMYCIN HCL IN DEXTROSE 1-5 GM/200ML-% IV SOLN: 1000.0000 mg | Freq: Once | INTRAVENOUS | Status: DC

## 2016-12-28 MED ORDER — MOMETASONE FURO-FORMOTEROL FUM 200-5 MCG/ACT IN AERO
2.0000 | INHALATION_SPRAY | Freq: Two times a day (BID) | RESPIRATORY_TRACT | Status: DC
  Filled 2016-12-28: qty 8.8

## 2016-12-28 MED ORDER — VANCOMYCIN HCL 10 G IV SOLR
1500.0000 mg | INTRAVENOUS | Status: DC
  Administered 2016-12-29 – 2016-12-31 (×3): 1500 mg via INTRAVENOUS
  Filled 2016-12-28 (×5): qty 1500

## 2016-12-28 MED ORDER — SODIUM CHLORIDE 0.9 % IV SOLN
INTRAVENOUS | Status: AC
  Administered 2016-12-28: via INTRAVENOUS

## 2016-12-28 MED ORDER — ACETAMINOPHEN 325 MG PO TABS: 650.0000 mg | ORAL_TABLET | Freq: Four times a day (QID) | ORAL | Status: DC | PRN

## 2016-12-28 MED ORDER — LIDOCAINE-EPINEPHRINE (PF) 2 %-1:200000 IJ SOLN
INTRAMUSCULAR | Status: AC
  Filled 2016-12-28: qty 20

## 2016-12-28 MED ORDER — DEXTROSE 5 % IV SOLN: 1.0000 g | Freq: Once | INTRAVENOUS | Status: DC

## 2016-12-28 MED ORDER — IPRATROPIUM-ALBUTEROL 0.5-2.5 (3) MG/3ML IN SOLN
3.0000 mL | Freq: Four times a day (QID) | RESPIRATORY_TRACT | Status: DC
Start: 2016-12-29 — End: 2016-12-31
  Administered 2016-12-29 – 2016-12-31 (×10): 3 mL via RESPIRATORY_TRACT
  Filled 2016-12-28 (×10): qty 3

## 2016-12-28 MED ORDER — ACETAMINOPHEN 650 MG RE SUPP
650.0000 mg | Freq: Four times a day (QID) | RECTAL | Status: DC | PRN
  Administered 2016-12-29: 650 mg via RECTAL
  Filled 2016-12-28: qty 1

## 2016-12-28 MED ORDER — VANCOMYCIN HCL 10 G IV SOLR
2000.0000 mg | Freq: Once | INTRAVENOUS | Status: AC
  Administered 2016-12-28: 2000 mg via INTRAVENOUS
  Filled 2016-12-28: qty 2000

## 2016-12-28 MED ORDER — INSULIN ASPART 100 UNIT/ML ~~LOC~~ SOLN
0.0000 [IU] | SUBCUTANEOUS | Status: DC
  Administered 2016-12-29: 3 [IU] via SUBCUTANEOUS
  Administered 2016-12-29 – 2016-12-30 (×4): 2 [IU] via SUBCUTANEOUS
  Administered 2016-12-30 – 2016-12-31 (×2): 3 [IU] via SUBCUTANEOUS
  Administered 2016-12-31 (×3): 2 [IU] via SUBCUTANEOUS
  Administered 2017-01-01: 3 [IU] via SUBCUTANEOUS
  Administered 2017-01-01: 2 [IU] via SUBCUTANEOUS
  Administered 2017-01-01: 3 [IU] via SUBCUTANEOUS
  Administered 2017-01-01 – 2017-01-02 (×2): 2 [IU] via SUBCUTANEOUS
  Administered 2017-01-02: 5 [IU] via SUBCUTANEOUS
  Administered 2017-01-02 (×2): 3 [IU] via SUBCUTANEOUS
  Administered 2017-01-02: 2 [IU] via SUBCUTANEOUS
  Administered 2017-01-02: 3 [IU] via SUBCUTANEOUS
  Administered 2017-01-03: 8 [IU] via SUBCUTANEOUS

## 2016-12-28 MED ORDER — SODIUM CHLORIDE 0.9% FLUSH
3.0000 mL | Freq: Two times a day (BID) | INTRAVENOUS | Status: DC
  Administered 2016-12-29 – 2017-01-03 (×9): 3 mL via INTRAVENOUS

## 2016-12-28 MED ORDER — ALBUTEROL SULFATE (2.5 MG/3ML) 0.083% IN NEBU: 2.5000 mg | INHALATION_SOLUTION | RESPIRATORY_TRACT | Status: DC | PRN

## 2016-12-28 MED ORDER — ASPIRIN EC 81 MG PO TBEC
81.0000 mg | DELAYED_RELEASE_TABLET | Freq: Every day | ORAL | Status: DC
  Administered 2016-12-30 – 2017-01-03 (×5): 81 mg via ORAL
  Filled 2016-12-28 (×6): qty 1

## 2016-12-28 NOTE — ED Notes (Signed)
Patient placed on BiPaP by RT.  Patient remains lethargic

## 2016-12-28 NOTE — ED Notes (Signed)
Attempted to call family about patient being here at [facility].  Family did not answer

## 2016-12-28 NOTE — ED Notes (Signed)
Patient is stable and ready to be transport to the floor at this time.  Report was called to 4E RN.  Belongings taken with the patient to the floor.   
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
## 2016-12-28 NOTE — ED Notes (Signed)
Wrong I-STAT ran (chem 8).
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
## 2016-12-28 NOTE — ED Triage Notes (Signed)
Patient comes from nursing home for AMS.  Snoring respirations.  Febrile and lethargic.  Patient is BKA and non mobile.  MD floyd at bedside.

## 2016-12-28 NOTE — Progress Notes (Signed)
Pharmacy Antibiotic Note  Heather Zimmerman is a 71 y.o. female from NH with hx of R BKA and non-mobile admitted on 12/28/2016 with cellulitis.  Pharmacy has been consulted for vancomycin/zosyn dosing. WBC 18.2 on admit. SCr 1.51 on admit (up from 0.9-1.1 ~35mo ago), normalized CrCl~37.  Plan: Zosyn 3.375g IV (25min inf) x1; then 3.375g IV q8h (4h inf) Vancomycin 2g IV x1; then 1500mg  IV q24h Monitor clinical progress, c/s, renal function, abx plan/LOT Vancomycin trough as indicated      No data recorded.   Recent Labs Lab 12/28/16 1621 12/28/16 1657 12/28/16 1930 12/28/16 1938  WBC 18.2*  --   --   --   CREATININE 1.51* 1.50* 1.60*  --   LATICACIDVEN  --  2.41*  --  1.94*    Estimated Creatinine Clearance: 37 mL/min (A) (by C-G formula based on SCr of 1.6 mg/dL (H)).    Allergies  Allergen Reactions  . Ace Inhibitors     unknown    Elicia Lamp, PharmD, BCPS Clinical Pharmacist 12/28/2016 8:08 PM

## 2016-12-29 ENCOUNTER — Inpatient Hospital Stay (HOSPITAL_COMMUNITY): Payer: Medicare Other

## 2016-12-29 DIAGNOSIS — J9601 Acute respiratory failure with hypoxia: Secondary | ICD-10-CM

## 2016-12-29 DIAGNOSIS — L03116 Cellulitis of left lower limb: Secondary | ICD-10-CM

## 2016-12-29 DIAGNOSIS — R4182 Altered mental status, unspecified: Secondary | ICD-10-CM

## 2016-12-29 DIAGNOSIS — J9621 Acute and chronic respiratory failure with hypoxia: Secondary | ICD-10-CM

## 2016-12-29 DIAGNOSIS — J9622 Acute and chronic respiratory failure with hypercapnia: Secondary | ICD-10-CM

## 2016-12-29 DIAGNOSIS — R404 Transient alteration of awareness: Secondary | ICD-10-CM

## 2016-12-29 DIAGNOSIS — R7989 Other specified abnormal findings of blood chemistry: Secondary | ICD-10-CM

## 2016-12-29 DIAGNOSIS — J9602 Acute respiratory failure with hypercapnia: Secondary | ICD-10-CM

## 2016-12-29 LAB — GLUCOSE, CAPILLARY
GLUCOSE-CAPILLARY: 116 mg/dL — AB (ref 65–99)
GLUCOSE-CAPILLARY: 164 mg/dL — AB (ref 65–99)
Glucose-Capillary: 110 mg/dL — ABNORMAL HIGH (ref 65–99)
Glucose-Capillary: 119 mg/dL — ABNORMAL HIGH (ref 65–99)
Glucose-Capillary: 137 mg/dL — ABNORMAL HIGH (ref 65–99)
Glucose-Capillary: 144 mg/dL — ABNORMAL HIGH (ref 65–99)
Glucose-Capillary: 147 mg/dL — ABNORMAL HIGH (ref 65–99)
Glucose-Capillary: 195 mg/dL — ABNORMAL HIGH (ref 65–99)

## 2016-12-29 LAB — CBC
HCT: 33.4 % — ABNORMAL LOW (ref 36.0–46.0)
Hemoglobin: 10.1 g/dL — ABNORMAL LOW (ref 12.0–15.0)
MCH: 27.7 pg (ref 26.0–34.0)
MCHC: 30.2 g/dL (ref 30.0–36.0)
MCV: 91.5 fL (ref 78.0–100.0)
PLATELETS: 163 10*3/uL (ref 150–400)
RBC: 3.65 MIL/uL — ABNORMAL LOW (ref 3.87–5.11)
RDW: 16.7 % — ABNORMAL HIGH (ref 11.5–15.5)
WBC: 20.1 10*3/uL — ABNORMAL HIGH (ref 4.0–10.5)

## 2016-12-29 LAB — URINE CULTURE: Culture: NO GROWTH

## 2016-12-29 LAB — LACTIC ACID, PLASMA
LACTIC ACID, VENOUS: 1.6 mmol/L (ref 0.5–1.9)
LACTIC ACID, VENOUS: 2.4 mmol/L — AB (ref 0.5–1.9)
Lactic Acid, Venous: 2.7 mmol/L (ref 0.5–1.9)
Lactic Acid, Venous: 2.6 mmol/L (ref 0.5–1.9)
Lactic Acid, Venous: 3.6 mmol/L (ref 0.5–1.9)

## 2016-12-29 LAB — BLOOD GAS, ARTERIAL
ACID-BASE EXCESS: 8 mmol/L — AB (ref 0.0–2.0)
Bicarbonate: 33.3 mmol/L — ABNORMAL HIGH (ref 20.0–28.0)
DELIVERY SYSTEMS: POSITIVE
DRAWN BY: 252031
EXPIRATORY PAP: 6
FIO2: 40
Inspiratory PAP: 12
Mode: POSITIVE
O2 SAT: 95.5 %
PCO2 ART: 58.6 mmHg — AB (ref 32.0–48.0)
PO2 ART: 80 mmHg — AB (ref 83.0–108.0)
PRESSURE SUPPORT: 6 cmH2O
Patient temperature: 98.6
RATE: 15 resp/min
pH, Arterial: 7.373 (ref 7.350–7.450)

## 2016-12-29 LAB — BASIC METABOLIC PANEL
ANION GAP: 8 (ref 5–15)
Anion gap: 5 (ref 5–15)
BUN: 32 mg/dL — AB (ref 6–20)
BUN: 34 mg/dL — ABNORMAL HIGH (ref 6–20)
CALCIUM: 8 mg/dL — AB (ref 8.9–10.3)
CALCIUM: 7.8 mg/dL — AB (ref 8.9–10.3)
CHLORIDE: 106 mmol/L (ref 101–111)
CO2: 31 mmol/L (ref 22–32)
CO2: 28 mmol/L (ref 22–32)
CREATININE: 1.77 mg/dL — AB (ref 0.44–1.00)
Chloride: 109 mmol/L (ref 101–111)
Creatinine, Ser: 1.93 mg/dL — ABNORMAL HIGH (ref 0.44–1.00)
GFR calc Af Amer: 32 mL/min — ABNORMAL LOW (ref >60)
GFR calc Af Amer: 29 mL/min — ABNORMAL LOW (ref >60)
GFR calc non Af Amer: 28 mL/min — ABNORMAL LOW (ref >60)
GFR calc non Af Amer: 25 mL/min — ABNORMAL LOW (ref >60)
GLUCOSE: 154 mg/dL — AB (ref 65–99)
GLUCOSE: 123 mg/dL — AB (ref 65–99)
POTASSIUM: 4 mmol/L (ref 3.5–5.1)
Potassium: 3.9 mmol/L (ref 3.5–5.1)
Sodium: 145 mmol/L (ref 135–145)
Sodium: 142 mmol/L (ref 135–145)

## 2016-12-29 LAB — PROCALCITONIN
PROCALCITONIN: 3.23 ng/mL
Procalcitonin: 2.98 ng/mL

## 2016-12-29 LAB — MRSA PCR SCREENING: MRSA by PCR: NEGATIVE

## 2016-12-29 LAB — AMMONIA: AMMONIA: 28 umol/L (ref 9–35)

## 2016-12-29 LAB — TROPONIN I
TROPONIN I: 0.05 ng/mL — AB (ref <0.03)
TROPONIN I: 0.03 ng/mL — AB (ref <0.03)
Troponin I: <0.03 ng/mL (ref <0.03)

## 2016-12-29 LAB — ETHANOL: Alcohol, Ethyl (B): <5 mg/dL (ref <5)

## 2016-12-29 LAB — CK TOTAL AND CKMB (NOT AT ARMC)
CK, MB: 5.2 ng/mL — AB (ref 0.5–5.0)
Relative Index: INVALID (ref 0.0–2.5)
Total CK: 92 U/L (ref 38–234)

## 2016-12-29 LAB — D-DIMER, QUANTITATIVE: D-Dimer, Quant: 2.42 ug/mL-FEU — ABNORMAL HIGH (ref 0.00–0.50)

## 2016-12-29 LAB — MAGNESIUM: MAGNESIUM: 1.9 mg/dL (ref 1.7–2.4)

## 2016-12-29 LAB — VITAMIN B12: VITAMIN B 12: 597 pg/mL (ref 180–914)

## 2016-12-29 LAB — BRAIN NATRIURETIC PEPTIDE: B Natriuretic Peptide: 28.7 pg/mL (ref 0.0–100.0)

## 2016-12-29 LAB — TSH: TSH: 0.697 u[IU]/mL (ref 0.350–4.500)

## 2016-12-29 LAB — RPR: RPR Ser Ql: NONREACTIVE

## 2016-12-29 LAB — HIV ANTIBODY (ROUTINE TESTING W REFLEX): HIV Screen 4th Generation wRfx: NONREACTIVE

## 2016-12-29 MED ORDER — LACTATED RINGERS IV BOLUS (SEPSIS)
1000.0000 mL | Freq: Once | INTRAVENOUS | Status: AC
  Administered 2016-12-29: 1000 mL via INTRAVENOUS

## 2016-12-29 MED ORDER — BUDESONIDE 0.25 MG/2ML IN SUSP
0.2500 mg | Freq: Two times a day (BID) | RESPIRATORY_TRACT | Status: DC
  Administered 2016-12-29 – 2016-12-31 (×5): 0.25 mg via RESPIRATORY_TRACT
  Filled 2016-12-29 (×5): qty 2

## 2016-12-29 MED ORDER — SODIUM CHLORIDE 0.9 % IV BOLUS (SEPSIS)
500.0000 mL | Freq: Once | INTRAVENOUS | Status: AC
  Administered 2016-12-29: 500 mL via INTRAVENOUS

## 2016-12-29 MED ORDER — MAGNESIUM SULFATE IN D5W 1-5 GM/100ML-% IV SOLN
1.0000 g | Freq: Once | INTRAVENOUS | Status: AC
  Administered 2016-12-29: 1 g via INTRAVENOUS
  Filled 2016-12-29: qty 100

## 2016-12-29 MED ORDER — SODIUM CHLORIDE 0.9 % IV SOLN
0.0000 ug/min | INTRAVENOUS | Status: DC
  Administered 2016-12-29: 120 ug/min via INTRAVENOUS
  Administered 2016-12-29: 90 ug/min via INTRAVENOUS
  Administered 2016-12-29: 100 ug/min via INTRAVENOUS
  Administered 2016-12-29: 20 ug/min via INTRAVENOUS
  Administered 2016-12-29: 70 ug/min via INTRAVENOUS
  Administered 2016-12-30: 55 ug/min via INTRAVENOUS
  Administered 2016-12-30 (×2): 120 ug/min via INTRAVENOUS
  Administered 2016-12-30: 70 ug/min via INTRAVENOUS
  Administered 2016-12-30: 120 ug/min via INTRAVENOUS
  Filled 2016-12-29 (×11): qty 1

## 2016-12-29 MED ORDER — ORAL CARE MOUTH RINSE
15.0000 mL | Freq: Two times a day (BID) | OROMUCOSAL | Status: DC
  Administered 2016-12-29: 15 mL via OROMUCOSAL

## 2016-12-29 MED ORDER — SODIUM CHLORIDE 0.9 % IV BOLUS (SEPSIS)
1000.0000 mL | Freq: Once | INTRAVENOUS | Status: AC
  Administered 2016-12-29: 1000 mL via INTRAVENOUS

## 2016-12-29 NOTE — Consult Note (Signed)
[facility] Nurse wound consult note Reason for Consult:  Chronic venous insufficiency to left lower leg. Generalized edema present.   Presented to ED with Unnas boot on left leg per note.  LEft leg with chronic skin changes, dry cracked skin, peeling epithelium, no drainage and no erythema, increased warmth.  R BKA present.  Wound type:Chronic venous insufficiency with acute on chronic  respiratory failure Pressure Injury POA: N/A Measurement:Nonintact, peeling epithelium from left knee down.  No drainage noted.   Wound bed: Dry cracked skin with pink new epithelium present and scarring.  Drainage (amount, consistency, odor) None noted.   Periwound:Chronic skin changes.  Dressing procedure/placement/frequency: Cleanse left leg with soap and water.  Will begin Unnas boots Monday to allow for respiratory status to avoid overloading.   Unnas boot twice weekly.  Will not follow at this time.  Please re-consult if needed.  Domenic Moras RN BSN [place] Pager [phone]

## 2016-12-29 NOTE — Progress Notes (Signed)
PT Cancellation Note  Patient Details Name: Heather Zimmerman MRN: [id number] DOB: [birth year]   Cancelled Treatment:    Reason Eval/Treat Not Completed: Medical issues which prohibited therapy.  Patient transferred to ICU this am due to decreased responsiveness, sepsis, hypotension, requiring BiPAP.  Will hold PT today. Will return at later time for PT evaluation when appropriate for patient.   Despina Pole 12/29/2016, 10:19 AM Carita Pian. Sanjuana Kava, [place] Pager [phone]

## 2016-12-29 NOTE — Progress Notes (Signed)
Patient transported to room 4E10 to 2M10 without any apparent complications.

## 2016-12-29 NOTE — Progress Notes (Addendum)
MD notified about Lactic Acid 2.7 and elevated Ddimer. SBP soft 80-90's and had not voided bladder scanned done 28 74ml. Orders received NS bolus and I will continue to monitor.

## 2016-12-29 NOTE — Progress Notes (Signed)
eLink Physician-Brief Progress Note Patient Name: Heather Zimmerman. [name] DOB: 09-16-1946 MRN: [id number]   Date of Service  12/29/2016  HPI/Events of Note  Lactic acid rising BP persistently low despite neosynephrine use, but on minimal dosing  eICU Interventions  Instructed nurse to titrate neosynephrine to MAP > 65     Intervention Category Major Interventions: Shock - evaluation and management  Simonne Maffucci 12/29/2016, 4:54 PM

## 2016-12-29 NOTE — Consult Note (Signed)
PULMONARY / CRITICAL CARE MEDICINE   Name: Heather Zimmerman. Mccart MRN: [id number] DOB: April 03, 1946    ADMISSION DATE:  12/28/2016 CONSULTATION DATE:  3/18  REFERRING MD:  Dr. 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 Pt presents with fever-102 , altered mental status and LLE chronic wounds. Her LA was elevated 2.41 on admission , she was given IVF with 1L NS intially . b/p remains soft in 50s . She was given IVF overnight with b/p remaining soft in upper 80s-low 90s. She has had a total of 3.5 L of IV NS . She was placed on BIPAP support overnight with continued altered mental status. She was started on Vanc and Zosyn. She repsonds to voice this am and does follow some simple commands.  LA remains elevated 2.4>1.9 >2.7 .  ABG on BIPAP this am showed pH 7.3, PCO2 58, PO2 80.  Flu swab neg. CT head was neg. CXR w/ mild CHF pattern.  D Dimer was elevated but unable to do CTa Chest due to kidney fxn . Felt VQ would not be accurate due to morbid obesity. PCCM asked to consult .  Pt had similar admission last month.   PAST MEDICAL HISTORY :  She  has a past medical history of Chronic diastolic heart failure ([place]) (12/31/2012); Chronic pain (12/31/2012); CKD (chronic kidney disease), stage III; Diabetes mellitus without complication ([place]); Essential hypertension, benign (12/31/2012); GERD (12/31/2012); Obstructive sleep apnea (07/05/2014); Shortness of breath dyspnea; Type II or unspecified type diabetes mellitus with peripheral circulatory disorders, not stated as uncontrolled(250.70) (12/31/2012); Unspecified vitamin D deficiency (12/31/2012); Venous insufficiency (chronic) (peripheral); and Venous stasis ulcers ([place]).  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  Allergies  Allergen Reactions  . Ace Inhibitors     unknown    No current facility-administered medications on file prior to encounter.    Current Outpatient Prescriptions on File Prior to Encounter  Medication Sig  . acetaminophen (TYLENOL) 650 MG CR tablet Take 650 mg by mouth every 6 (six) hours as needed for pain.  . Amino Acids-Protein Hydrolys (FEEDING SUPPLEMENT, PRO-STAT SUGAR FREE 64,) LIQD Take 30 mLs by mouth 3 (three) times daily with meals. For wound healing and skin integrity  . antiseptic oral rinse (BIOTENE) LIQD 15 mLs by Mouth Rinse route every hour as needed for dry mouth.   Marland Kitchen aspirin 81 MG tablet Take 81 mg by mouth daily.  Marland Kitchen CALCIUM ALGINATE EX Apply to left distal calf topically every 2 days  . camphor-menthol (SARNA) lotion Apply 1 application topically every 8 (eight) hours as needed for itching.  . cetirizine-pseudoephedrine (ZYRTEC-D) 5-120 MG tablet Take one tablet by mouth once daily as needed for allergies  . cholecalciferol (VITAMIN D) 1000 UNITS tablet Take 2,000 Units by mouth daily.  . DULoxetine (CYMBALTA) 20 MG capsule Take 40 mg by mouth daily.   . Eyelid Cleansers (OCUSOFT EYELID CLEANSING) PADS Place 1 drop into both eyes daily.   . fluticasone (FLONASE) 50 MCG/ACT nasal spray Place 2 sprays into both nostrils at bedtime.  . Fluticasone-Salmeterol (ADVAIR) 250-50 MCG/DOSE AEPB Inhale 1 puff into the lungs every  12 (twelve) hours. Reported on 04/25/2016  . gabapentin (NEURONTIN) 600 MG tablet Take 600 mg by mouth 2 (two) times daily.  Marland Kitchen glucagon (GLUCAGON EMERGENCY) 1 MG injection Inject 1 mg into the vein once as needed (blood sugar).  . insulin aspart protamine- aspart (NOVOLOG MIX 70/30) (70-30) 100 UNIT/ML injection Inject into the skin. Inject as per sliding scale: 121-150=2u, 151-200=3u, 201-250=5u, 251-300=8u, 301-350=11u,  351-400=15u before meals and at bedtime  . Insulin Glargine (LANTUS SOLOSTAR) 100 UNIT/ML Solostar Pen Inject 26 Units into the skin daily at 10 pm.  . ipratropium-albuterol (DUONEB) 0.5-2.5 (3) MG/3ML SOLN Take 3 mLs by nebulization every 6 (six) hours as needed (shortness of breath).  . magnesium hydroxide (MILK OF MAGNESIA) 400 MG/5ML suspension Take 30 mLs by mouth daily as needed for mild constipation.  . magnesium oxide (MAG-OX) 400 MG tablet Take 400 mg by mouth 3 (three) times daily.   . OXYGEN Inhale into the lungs. 2 liter/min via nasal cannula  . potassium chloride SA (K-DUR,KLOR-CON) 20 MEQ tablet Take 20 mEq by mouth daily.  Marland Kitchen torsemide (DEMADEX) 20 MG tablet Take 1.5 tablets (30 mg total) by mouth daily.  . vitamin C (ASCORBIC ACID) 500 MG tablet Take 500 mg by mouth 2 (two) times daily.  . Zinc Sulfate 110 MG TABS Take 220 mg by mouth daily.     FAMILY HISTORY:  Her indicated that the status of her other is unknown.    SOCIAL HISTORY: She  reports that she has never smoked. She has never used smokeless tobacco. She reports that she does not drink alcohol or use drugs.  REVIEW OF SYSTEMS:   Unable to obtain as pt is confused on BIPAP   SUBJECTIVE:  Consult for hypotension /sepsis   VITAL SIGNS: BP (!) 86/46   Pulse (!) 115   Temp (!) 102.7 F (39.3 C) (Rectal)   Resp (!) 28   Ht 5' (1.524 m) Comment: approx.  Wt 290 lb 5.5 oz (131.7 kg)   LMP  (LMP Unknown)   SpO2 99%   BMI 56.70 kg/m   HEMODYNAMICS:    VENTILATOR SETTINGS: Vent Mode: BIPAP;PCV FiO2 (%):  [40 %] 40 % Set Rate:  [15 bmp] 15 bmp PEEP:  [6 cmH20] 6 cmH20  INTAKE / OUTPUT: I/O last 3 completed shifts: In: 2206.3 [I.V.:656.3; IV Piggyback:1550] Out: 0   PHYSICAL EXAMINATION: General:  Morbidly obese female on BIPAP , ill appearing  Neuro: confused, responds to name, followed simple commands with hand grips  HEENT: Dry mucosa, BIPAP in place Cardiovascular: ST , no m/r/g Lungs:  diminshed BS in bases  Abdomen: morbid obese, soft, hypoactive bs  Musculoskeletal:  s/p RBKA,, LLE with diffuse erythema , scattered wounds with stasis dermatitic changes  Skin:  LLE wounds , reported sacral stg I ulcer   LABS:  BMET  Recent Labs Lab 12/28/16 1621 12/28/16 1657 12/28/16 1930  NA 141 142 143  K 4.8 4.8 4.7  CL 98* 98* 100*  CO2 32  --   --   BUN 33* 46* 36*  CREATININE 1.51* 1.50* 1.60*  GLUCOSE 183* 194* 173*    Electrolytes  Recent Labs Lab 12/28/16 1621 12/29/16 0101  CALCIUM 9.5  --   MG  --  1.9    CBC  Recent Labs Lab 12/28/16 1621 12/28/16 1657 12/28/16 1930 12/29/16 0637  WBC 18.2*  --   --  20.1*  HGB 13.0 14.3 12.9 10.1*  HCT 41.2 42.0 38.0 33.4*  PLT  197  --   --  163    Coag's No results for input(s): APTT, INR in the last 168 hours.  Sepsis Markers  Recent Labs Lab 12/28/16 1657 12/28/16 1938 12/29/16 0101  LATICACIDVEN 2.41* 1.94* 2.7*    ABG  Recent Labs Lab 12/28/16 2354  PHART 7.373  PCO2ART 58.6*  PO2ART 80.0*    Liver Enzymes  Recent Labs Lab 12/28/16 1621  AST 34  ALT 22  ALKPHOS 167*  BILITOT 1.2  ALBUMIN 3.1*    Cardiac Enzymes  Recent Labs Lab 12/29/16 0101  TROPONINI 0.05*    Glucose  Recent Labs Lab 12/28/16 1618 12/29/16 0054 12/29/16 0330  GLUCAP 159* 164* 195*    Imaging Dg Chest 2 View  Result Date: 12/28/2016 CLINICAL DATA:  71 year old female with history of sepsis. Diastolic heart failure. Diabetes, diabetes and hypertension. EXAM: CHEST  2 VIEW COMPARISON:  Chest x-ray 11/13/2016. FINDINGS: Lung volumes are low. No consolidative airspace disease. Trace bilateral pleural effusions. There is cephalization of the pulmonary vasculature and slight indistinctness of the interstitial markings suggestive of mild pulmonary edema. Mild cardiomegaly. Upper mediastinal contours are within normal limits. Aortic atherosclerosis. IMPRESSION: 1. The appearance the chest suggests  mild congestive heart failure, as above. 2. Aortic atherosclerosis. Electronically Signed   By: Vinnie Langton M.D.   On: 12/28/2016 17:36   Ct Head Wo Contrast  Result Date: 12/28/2016 CLINICAL DATA:  Febrile an lethargic patient. EXAM: CT HEAD WITHOUT CONTRAST TECHNIQUE: Contiguous axial images were obtained from the base of the skull through the vertex without intravenous contrast. COMPARISON:  11/12/2016 FINDINGS: Brain: No evidence of acute infarction, hemorrhage, hydrocephalus, extra-axial collection or mass lesion/mass effect. Vascular: No hyperdense vessel or unexpected calcification. Skull: Normal. Negative for fracture or focal lesion. Sinuses/Orbits: Chronic right maxillary sinusitis with associated osseous changes of the maxillary walls. Remote right orbital fracture. Other: None. IMPRESSION: No acute intracranial abnormality. Chronic right maxillary sinusitis. Electronically Signed   By: Fidela Salisbury M.D.   On: 12/28/2016 19:43     STUDIES:  CT head 3/17 >neg   CULTURES: 3/17 BC >> 3/17 UC >>  ANTIBIOTICS: 3/17 Zosyn .> 3/17 Vanc >>  SIGNIFICANT EVENTS: 3/18 BIPAP started, transfer to ICU   LINES/TUBES:   DISCUSSION: 71 yo female SNF pt readmitted with Sepsis w/ suspected cellulitis source and acute on chronic hypercarbic /hypoxic respiratory failure with known OSA/OHS .   ASSESSMENT / PLAN:  PULMONARY A: Acute on chronic hypercarbic/hypoxic respiratory failure  OSA/OHS w/ morbid obesity -reported CPAP noncompliance Asthma on Advair   P:   Transfer to ICU  Continue BIPAP support  Continue on Duoneb .  Stop Dulera , change to pumicort neb Twice daily  If mentation worsens and can not protect airway , low threshold for intubation .   CARDIOVASCULAR A:  CHF w/ preserved EF , gr 1 DD -echo 07/2016  Septic Shock  P:  Diuretics on hold  Continue with IVF resuciatation, if unable to to MAP goal >65, then add pressor support  IV NS 1L bolus x 1 , then  125cc/hr   RENAL A:   Acute on Chronic Kidney (baseline 1-1.4 )  P:   Cont IVF  Avoid nephrotoxin Tr bmet, replace electrolytes as indicated.   GASTROINTESTINAL A:   Nutrition  P:   NPO for now while on BIPAP /AMS   HEMATOLOGIC A:   Anemia  Elevated D Dimer -unable to do CTa Chest / VQ ?accuracy with wt.  P:  Tr CBC  Cont Lovenox for DVT prophylaxis   INFECTIOUS A:   Sepsis w/ suspected LLE cellulitis as source (xray in 10/2016 neg for osteo)  P:   Cont IV abx w/ Zosyn/Vanc Follow cx data  Cont wound care   ENDOCRINE A:   DM  P:   Cont SSI   NEUROLOGIC A:   Acute Encephalopathy (nml Ammonia, B12, ethanol), CT head neg.  -most likely due to hypotension/sepsis /hypercarbia  P:   Cont to hold gabapentin and cymbalta  w/up pending with labs for HIV,RPR  Avoid sedating rx  Cont BIPAP support.   FAMILY  - Updates: none available   - Inter-disciplinary family meet or Palliative Care meeting due by:  01/05/17   [facility] ParrettNP-C  Pulmonary and [place] Pager: [phone]  12/29/2016, 7:55 AM

## 2016-12-29 NOTE — Progress Notes (Signed)
CRITICAL VALUE ALERT  Critical value received:  Lactic acid 3.6  Date of notification:  12/29/16  Time of notification:  11:01 PM  Critical value read back:Yes.    Nurse who received alert:  Leia Alf RN  MD notified (1st page):  Dr. Oletta Darter  Time of first page:  11:02 PM

## 2016-12-29 NOTE — Progress Notes (Addendum)
MD notified to clarify CT chest to be done now and it needs to be done in the morning after morning labs have been viewed. MD aware of soft SBP, elevated temp 102.7 and still no urine. I will continue to monitor.

## 2016-12-29 NOTE — Progress Notes (Signed)
Family Medicine Teaching Service Daily Progress Note Intern Pager: [phone]  Patient name: Heather Zimmerman. [place] record number: [id number] Date of birth: 14-Jul-1946 Age: 71 y.o. Gender: female  Primary Care Provider: Gildardo Cranker, DO Consultants: CCM Code Status: FULL  Pt Overview and Major Events to Date:  03/17 admitted to [facility] and Plan: Heather Zimmerman. Heather Zimmerman is a 71 y.o. female presenting with altered mental status found to have hypercarbic respiratory failure and meeting sepsis criteria. PMH is significant for HFpEF, OSA on CPAP, chronic respiratory failure on 2L O2 at home, chronic left lower extremity wounds, s/p right BKA, T2DM, depression, and CKD.   Sepsis: Suspected source: left lower extremity cellulitis. Meeting SIRS criteria with fever (Tmax 101.3), tachycardia, tachypnea and leukocytosis. Additionally now with hypotension (86/46). Lactic acid was improving, but increased overnight to 2.7. WBC increased further to 20. Qsofa score of 2 due to AMS and increased RR. CXR without consolidation or infiltrates suggestive of PNA. Flu neg. UA not concerning for infectious process. Now s/p 3.5L and requiring BiPAP.  -Telemetry with continuous pulse ox  -Cont vancomycin and Zosyn, consider narrowing for treatment of cellulitis  -Blood and urine cultures pending -Repeat lactic acid this AM -Repeat CXR this AM -CCM consulted and will transfer to ICU  Acute Encephalopathy: Most likely secondary to infection complicated by hypercarbic respiratory failure. Electrolytes stable at admission. BUN is mildly elevated to 33. Ammonia, magnesium, B12 WNL. Ethanol neg. Patient not hypoglycemic at presentation. CT head negative. EKG without acute ST segment changes. Repeat EKG this AM with sinus tach. Trop initially elevated at 0.05 but now WNL at <0.03.  -Neuro checks q2h for 24h -F/u HIV, folate, and RPR -F/u UDS  -Management of sepsis as above  -Avoid sedating agents; holding home  Gabapentin and Cymbalta   Acute on Chronic Hypercarbic Respiratory Failure: VBG at presentation: pH 7.37, pCO2 68.5, pO2 51.0, bicarb 39.7. ABG with pH 7.37, pCO2 58, pO2 80, bicarb 33. Has had elevated CO2 on previous blood gases to as high as 106. Has required intubation for acute respiratory failure within the past year. CXR with trace bilateral pleural effusions and mild pulmonary edema. Well's score for PE of 3 for tachycardia and suspected immobilization. BNP WNL and only mild pulmonary edema on CXR making CHF less likely cause of respiratory distress. D-dimer elevated at 2.24, though as patient is elderly obese female this may be falsely elevated. Patient is currently protecting her airway. At home on CPAP nightly and O2  throughout the daytime.Now requiring BiPAP.  -BiPAP prn  -Duonebs q6h/albuterol q2h prn  -Continue Dulera (substitute for home Advair)  -Repeat CXR this AM -CCM consulted and will transfer to ICU  Left LE Cellulitis: With chronic skin changes. No draining wounds or ulcers present on admission. Patient reportedly in [name] prior to arrival. Discharged with course of Doxycyline on 2/6 to treat cellulitis. Negative left LE X-rays from 1/30 for osteomyelitis.  -Abx as above  -Wound care consult  -Consider repeat Xrays on lower extremity to evaluate for osteo   AoCKD: Patient with history of CKD (bl difficult to access but appears to be between 1.0-1.4). Cr at presentation 1.6. Suspect pre-renal etiology from presumed poor PO intake as patient has apparently been altered for >24h prior to presentation. Cr increased this AM to 1.77 despite aggressive fluid resuscitation overnight.  -s/p 3.5L NS bolus, NS at 125 cc/hr x12h starting overnight -AM BMP -Avoid nephrotoxic agents   HFpEF: Most recent echo (Oct 2017) with  LVEF 60-65%, G1DD, and normal pulmonary artery pressure. Patient takes Torsemide 30 mg daily. CXR with trace bilateral pleural effusions and mild pulmonary  edema. Volume status difficult to assess clinically due to body habitus. BNP WNL.  -Holding home Torsemide due to AKI and while giving IVFs for sepsis  -Repeat CXR this AM   T2DM: Takes Lantus 26U qhs and Novolog SSI at home. CBGs 159-194 while in ER. Hyperglycemic this Am to 195.  -Holding long acting insulin while NPO  -Moderate SSI with CBGs q4h  -Hold gabapentin given acute encephalopathy   OSA:  -CPAP nightly   Depression: -Holding Cymbalta due to AMS   Morbid Obesity:  -PT/OT consult  -SW consult as anticipate return to SNF   FEN/GI: NPO, NS at 125 cc/hr x12h Prophylaxis: Lovenox   Disposition: pending medical improvement  Subjective:  Patient initially more alert this AM, answering simple "yes" and "no" questions. Upon evaluating patient a second time this AM, she was less responsive, with only minimal non-verbal response to sternal rub. CCM to transfer to ICU.   Objective: Temp:  [99.2 F (37.3 C)-102.7 F (39.3 C)] 99.4 F (37.4 C) (03/18 0717) Pulse Rate:  [92-117] 103 (03/18 0717) Resp:  [21-31] 23 (03/18 0717) BP: (86-156)/(34-111) 89/50 (03/18 0717) SpO2:  [93 %-100 %] 100 % (03/18 0910) FiO2 (%):  [40 %] 40 % (03/18 0910) Weight:  [290 lb 5.5 oz (131.7 kg)] 290 lb 5.5 oz (131.7 kg) (03/17 2145) Physical Exam: General: Obese elderly female lying in bed with BiPAP Cardiovascular: Tachycardic, regular rhythm, no murmurs appreciated.  Respiratory: Wearing BiPAP, breath sounds diminished bilaterally Abdomen: Rotund, +BS, soft, non-tender, non-distended Extremities: Right BKA with 1+ edema. Left LE extremity diffusely erythematous and warm to the touch with chronic wounds in various stages of healing, no draining wounds or deep ulcers present. Neuro: Minimally responsive to sternal rub, otherwise non-responsive Psych: Unable to assess  Laboratory:  Recent Labs Lab 12/28/16 1621 12/28/16 1657 12/28/16 1930 12/29/16 0637  WBC 18.2*  --   --  20.1*   HGB 13.0 14.3 12.9 10.1*  HCT 41.2 42.0 38.0 33.4*  PLT 197  --   --  163    Recent Labs Lab 12/28/16 1621 12/28/16 1657 12/28/16 1930 12/29/16 0637  NA 141 142 143 145  K 4.8 4.8 4.7 3.9  CL 98* 98* 100* 109  CO2 32  --   --  31  BUN 33* 46* 36* 32*  CREATININE 1.51* 1.50* 1.60* 1.77*  CALCIUM 9.5  --   --  8.0*  PROT 8.7*  --   --   --   BILITOT 1.2  --   --   --   ALKPHOS 167*  --   --   --   ALT 22  --   --   --   AST 34  --   --   --   GLUCOSE 183* 194* 173* 154*    Imaging/Diagnostic Tests: Dg Chest 2 View  Result Date: 12/28/2016 CLINICAL DATA:  71 year old female with history of sepsis. Diastolic heart failure. Diabetes, diabetes and hypertension. EXAM: CHEST  2 VIEW COMPARISON:  Chest x-ray 11/13/2016. FINDINGS: Lung volumes are low. No consolidative airspace disease. Trace bilateral pleural effusions. There is cephalization of the pulmonary vasculature and slight indistinctness of the interstitial markings suggestive of mild pulmonary edema. Mild cardiomegaly. Upper mediastinal contours are within normal limits. Aortic atherosclerosis. IMPRESSION: 1. The appearance the chest suggests mild congestive heart failure, as above. 2.  Aortic atherosclerosis. Electronically Signed   By: Vinnie Langton M.D.   On: 12/28/2016 17:36   Ct Head Wo Contrast  Result Date: 12/28/2016 CLINICAL DATA:  Febrile an lethargic patient. EXAM: CT HEAD WITHOUT CONTRAST TECHNIQUE: Contiguous axial images were obtained from the base of the skull through the vertex without intravenous contrast. COMPARISON:  11/12/2016 FINDINGS: Brain: No evidence of acute infarction, hemorrhage, hydrocephalus, extra-axial collection or mass lesion/mass effect. Vascular: No hyperdense vessel or unexpected calcification. Skull: Normal. Negative for fracture or focal lesion. Sinuses/Orbits: Chronic right maxillary sinusitis with associated osseous changes of the maxillary walls. Remote right orbital fracture. Other:  None. IMPRESSION: No acute intracranial abnormality. Chronic right maxillary sinusitis. Electronically Signed   By: Fidela Salisbury M.D.   On: 12/28/2016 19:43    [place], MD 12/29/2016, 9:16 AM PGY-2, [place] Intern pager: [phone], text pages welcome

## 2016-12-29 NOTE — Progress Notes (Signed)
Patient to transfer to 2M10 report given to receiving nurse Johnny, all questions answered at this time.  Pt. Belonging sent with patient.

## 2016-12-29 NOTE — Progress Notes (Signed)
Have now ordered total of 1.5L additional NS bolus for BPs 86-96/34-64 with MAP 55-72. Lactic acid noted to uptrend from 1.94 > 2.7 prior to administration of these additional fluids. Will monitor BPs and awaiting repeat lactic acid.   D dimer elevated to 2.4. Given AKI at presentation, will follow up Cr on morning labs. Hopeful that kidney injury will have improved and we will be able to proceed with CTA chest to evaluate for PE as suspect V/Q scan would be of limited yield in patient due to morbid obesity   Phill Myron, D.O. 12/29/2016, 4:29 AM PGY-2, [place]

## 2016-12-29 NOTE — Progress Notes (Signed)
eLink Physician-Brief Progress Note Patient Name: Heather Zimmerman DOB: 1946-05-13 MRN: [id number]   Date of Service  12/29/2016  HPI/Events of Note  Lactic Acid = 2.6 >> 3.6. Last LVEF = 60% to 65%. BP = 111/44 w/MAP = 62. No CVL.  eICU Interventions  Will order: 1. LR 1 liter IV over 1 hour now.    >>    Intervention Category Major Interventions: Acid-Base disturbance - evaluation and management  Qasim Diveley [place] 12/29/2016, 11:01 PM

## 2016-12-30 DIAGNOSIS — I2781 Cor pulmonale (chronic): Secondary | ICD-10-CM

## 2016-12-30 DIAGNOSIS — A419 Sepsis, unspecified organism: Principal | ICD-10-CM

## 2016-12-30 LAB — CBC WITH DIFFERENTIAL/PLATELET
Basophils Absolute: 0 10*3/uL (ref 0.0–0.1)
Basophils Relative: 0 %
Eosinophils Absolute: 0.5 10*3/uL (ref 0.0–0.7)
Eosinophils Relative: 4 %
HEMATOCRIT: 32.4 % — AB (ref 36.0–46.0)
HEMOGLOBIN: 9.7 g/dL — AB (ref 12.0–15.0)
LYMPHS ABS: 1.3 10*3/uL (ref 0.7–4.0)
Lymphocytes Relative: 10 %
MCH: 28 pg (ref 26.0–34.0)
MCHC: 29.9 g/dL — AB (ref 30.0–36.0)
MCV: 93.6 fL (ref 78.0–100.0)
MONOS PCT: 7 %
Monocytes Absolute: 0.9 10*3/uL (ref 0.1–1.0)
NEUTROS ABS: 10.1 10*3/uL — AB (ref 1.7–7.7)
NEUTROS PCT: 79 %
Platelets: 147 10*3/uL — ABNORMAL LOW (ref 150–400)
RBC: 3.46 MIL/uL — ABNORMAL LOW (ref 3.87–5.11)
RDW: 17.4 % — ABNORMAL HIGH (ref 11.5–15.5)
WBC: 12.8 10*3/uL — ABNORMAL HIGH (ref 4.0–10.5)

## 2016-12-30 LAB — GLUCOSE, CAPILLARY
GLUCOSE-CAPILLARY: 106 mg/dL — AB (ref 65–99)
GLUCOSE-CAPILLARY: 112 mg/dL — AB (ref 65–99)
Glucose-Capillary: 124 mg/dL — ABNORMAL HIGH (ref 65–99)
Glucose-Capillary: 126 mg/dL — ABNORMAL HIGH (ref 65–99)
Glucose-Capillary: 177 mg/dL — ABNORMAL HIGH (ref 65–99)
Glucose-Capillary: 83 mg/dL (ref 65–99)

## 2016-12-30 LAB — BASIC METABOLIC PANEL
Anion gap: 7 (ref 5–15)
BUN: 30 mg/dL — AB (ref 6–20)
CHLORIDE: 111 mmol/L (ref 101–111)
CO2: 26 mmol/L (ref 22–32)
CREATININE: 1.73 mg/dL — AB (ref 0.44–1.00)
Calcium: 8 mg/dL — ABNORMAL LOW (ref 8.9–10.3)
GFR calc Af Amer: 33 mL/min — ABNORMAL LOW (ref >60)
GFR calc non Af Amer: 29 mL/min — ABNORMAL LOW (ref >60)
Glucose, Bld: 135 mg/dL — ABNORMAL HIGH (ref 65–99)
Potassium: 3.9 mmol/L (ref 3.5–5.1)
Sodium: 144 mmol/L (ref 135–145)

## 2016-12-30 LAB — RAPID URINE DRUG SCREEN, HOSP PERFORMED
Amphetamines: NOT DETECTED
Barbiturates: NOT DETECTED
Benzodiazepines: NOT DETECTED
Cocaine: NOT DETECTED
Opiates: NOT DETECTED
Tetrahydrocannabinol: NOT DETECTED

## 2016-12-30 LAB — FOLATE RBC
Folate, Hemolysate: 528.1 ng/mL
Folate, RBC: 1330 ng/mL
Hematocrit: 39.7 % (ref 34.0–46.6)

## 2016-12-30 LAB — LACTIC ACID, PLASMA: Lactic Acid, Venous: 2.2 mmol/L (ref 0.5–1.9)

## 2016-12-30 LAB — PHOSPHORUS: Phosphorus: 2.9 mg/dL (ref 2.5–4.6)

## 2016-12-30 LAB — PROCALCITONIN: PROCALCITONIN: 2.1 ng/mL

## 2016-12-30 LAB — MAGNESIUM: Magnesium: 2.1 mg/dL (ref 1.7–2.4)

## 2016-12-30 LAB — TROPONIN I: Troponin I: 0.04 ng/mL (ref <0.03)

## 2016-12-30 MED ORDER — CHLORHEXIDINE GLUCONATE 0.12 % MT SOLN
15.0000 mL | Freq: Two times a day (BID) | OROMUCOSAL | Status: DC
  Administered 2016-12-30 – 2017-01-03 (×8): 15 mL via OROMUCOSAL
  Filled 2016-12-30 (×6): qty 15

## 2016-12-30 MED ORDER — PRO-STAT SUGAR FREE PO LIQD
30.0000 mL | Freq: Two times a day (BID) | ORAL | Status: DC
  Administered 2016-12-30 – 2017-01-03 (×7): 30 mL via ORAL
  Filled 2016-12-30 (×8): qty 30

## 2016-12-30 MED ORDER — JUVEN PO PACK
1.0000 | PACK | Freq: Two times a day (BID) | ORAL | Status: DC
  Administered 2016-12-31 – 2017-01-03 (×7): 1 via ORAL
  Filled 2016-12-30 (×9): qty 1

## 2016-12-30 MED ORDER — ORAL CARE MOUTH RINSE
15.0000 mL | Freq: Two times a day (BID) | OROMUCOSAL | Status: DC
  Administered 2016-12-30 – 2017-01-03 (×8): 15 mL via OROMUCOSAL

## 2016-12-30 NOTE — Progress Notes (Signed)
eLink Physician-Brief Progress Note Patient Name: Heather Zimmerman. Dimichele DOB: 1945/12/16 MRN: [id number]   Date of Service  12/30/2016  HPI/Events of Note  Camera check on the patient:   Patient seen, comfortable, sitting on the bed Not in distress. Just finished her lunch.   Blood pressure 102/46, heart rate 84, respiratory rate 16, sats 96% on nasal cannula.   eICU Interventions  Cont to observe.      Intervention Category Evaluation Type: Other  [place] 12/30/2016, 4:17 PM

## 2016-12-30 NOTE — Progress Notes (Signed)
PULMONARY / CRITICAL CARE MEDICINE   Name: Heather Zimmerman. Brozowski MRN: [id number] DOB: 20-Sep-1946    ADMISSION DATE:  12/28/2016 CONSULTATION DATE:  3/18  REFERRING MD:  Dr. 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 Pt presents with fever-102 , altered mental status and LLE chronic wounds. Her LA was elevated 2.41 on admission , she was given IVF with 1L NS intially . b/p remains soft in 43s . She was given IVF overnight with b/p remaining soft in upper 80s-low 90s. She has had a total of 3.5 L of IV NS . She was placed on BIPAP support overnight with continued altered mental status. She was started on Vanc and Zosyn. She repsonds to voice this am and does follow some simple commands. LA remains elevated 2.4>1.9 >2.7 .  ABG on BIPAP this am showed pH 7.3, PCO2 58, PO2 80.  Flu swab neg. CT head was neg. CXR w/ mild CHF pattern. D Dimer was elevated but unable to do CTA Chest due to kidney fxn . Felt VQ would not be accurate due to morbid obesity. PCCM asked to consult . Pt had similar admission last month.   SUBJECTIVE/INTERVAL:  Feeling better today. Tolerating nasal cannula and pressor requirements greatly improving.  VITAL SIGNS: BP (!) 103/57   Pulse 73   Temp 99.1 F (37.3 C) (Oral)   Resp (!) 21   Ht 5' (1.524 m)   Wt 134.8 kg (297 lb 2.9 oz)   LMP  (LMP Unknown)   SpO2 96%   BMI 58.04 kg/m   HEMODYNAMICS:    VENTILATOR SETTINGS: Vent Mode: BIPAP FiO2 (%):  [30 %-40 %] 30 % Set Rate:  [15 bmp] 15 bmp PEEP:  [6 cmH20] 6 cmH20  INTAKE / OUTPUT: I/O last 3 completed shifts: In: 7290.3 [I.V.:2980.3; Other:10; IV [id number]:[id number]] Out: 6734 [Urine:1650]  PHYSICAL EXAMINATION:  General:  Morbidly obese female  in NAD in ICU bed Neuro:  Alert, oriented with some mild confusion.  HEENT:  [place]/AT, No JVD noted Cardiovascular:  RRR, no MRG Lungs:  Distant, but sound clear Abdomen:  Soft, non-distended, non-tender Musculoskeletal:  No acute deformity, remote RLE amputation Skin:  Intact, MMM  LABS:  BMET  Recent Labs Lab 12/29/16 0637 12/29/16 1535 12/30/16 0059  NA 145 142 144  K 3.9 4.0 3.9  CL 109 106 111  CO2 31 28 26   BUN 32* 34* 30*  CREATININE 1.77* 1.93* 1.73*  GLUCOSE 154* 123* 135*    Electrolytes  Recent Labs Lab 12/29/16 0101 12/29/16 0637 12/29/16 1535 12/30/16 0059 12/30/16 0646  CALCIUM  --  8.0* 7.8* 8.0*  --   MG 1.9  --   --   --  2.1  PHOS  --   --   --   --  2.9    CBC  Recent Labs Lab 12/28/16 1621  12/28/16 1930 12/29/16 0637 12/30/16 0646  WBC 18.2*  --   --  20.1* 12.8*  HGB 13.0  < > 12.9 10.1* 9.7*  HCT 41.2  < > 38.0 33.4* 32.4*  PLT 197  --   --  163 147*  < > = values in this interval not displayed.  Coag's No results  for input(s): APTT, INR in the last 168 hours.  Sepsis Markers  Recent Labs Lab 12/29/16 0833  12/29/16 1535 12/29/16 2159 12/30/16 0059 12/30/16 0646  LATICACIDVEN  --   < > 2.6* 3.6* 2.2*  --   PROCALCITON 2.98  --  3.23  --   --  2.10  < > = values in this interval not displayed.  ABG  Recent Labs Lab 12/28/16 2354  PHART 7.373  PCO2ART 58.6*  PO2ART 80.0*    Liver Enzymes  Recent Labs Lab 12/28/16 1621  AST 34  ALT 22  ALKPHOS 167*  BILITOT 1.2  ALBUMIN 3.1*    Cardiac Enzymes  Recent Labs Lab 12/29/16 0637 12/29/16 1535 12/30/16 0646  TROPONINI <0.03 0.03* 0.04*    Glucose  Recent Labs Lab 12/29/16 1642 12/29/16 1935 12/29/16 2349 12/30/16 0410 12/30/16 0750 12/30/16 1141  GLUCAP 116* 144* 119* 126* 83 106*    Imaging No results found.   STUDIES:  CT head 3/17 >neg   CULTURES: 3/17 BC >> 3/17 UC >>  ANTIBIOTICS: 3/17 Zosyn .> 3/17 Vanc >>  SIGNIFICANT  EVENTS: 3/18 BIPAP started, transfer to ICU   LINES/TUBES:   DISCUSSION: 71 yo female SNF pt readmitted with Sepsis w/ suspected cellulitis source and acute on chronic hypercarbic /hypoxic respiratory failure with known OSA/OHS .   ASSESSMENT / PLAN:  PULMONARY  A: Acute on chronic hypercarbic/hypoxic respiratory failure  OSA/OHS w/ morbid obesity -reported CPAP noncompliance Asthma on Advair   P:   Continue supplemental O2 titrated to O2 sats 88-95% Will likely need QHS CPAP. She has been non-compliant with this. Scheduled duonebs, Pulmicort nebs Holding Advair.  CARDIOVASCULAR A:  CHF w/ preserved EF , gr 1 DD - echo 07/2016  Septic Shock  P:  Telemetry monitoring Continue neo for MAP goal > 60mmHg Should be able to wean off in next few hours If able to wean off can transfer out Lactic normalized  RENAL A:   Acute on Chronic Kidney (baseline 1-1.4)   P:   KVO IVF Follow BMP  GASTROINTESTINAL A:   Nutrition   P:   Has been doing well off BiPAP so will start diet   HEMATOLOGIC A:   Anemia  Elevated D Dimer -unable to do CTA Chest / VQ ?accuracy with wt.   P:  Lovenox for VTE ppx Follow CBC  INFECTIOUS A:   Sepsis w/ suspected LLE cellulitis as source (xray in 10/2016 neg for osteo)  P:   Continue broad ABX as above Follow cx data  Cont wound care  May need to re-image LLE  ENDOCRINE A:   DM  P:   Continue SSI   NEUROLOGIC A:   Acute Encephalopathy (nml Ammonia, B12, ethanol), CT head neg.  - most likely due to hypotension/sepsis/hypercarbia  P:   Cont to hold gabapentin and cymbalta  Monitor  FAMILY  - Updates: patient updated  - Inter-disciplinary family meet or Palliative Care meeting due by:  01/05/17  Georgann Housekeeper, AGACNP-BC [facility] Pager [phone] or [phone]  12/30/2016 2:04 PM  Attending Note:  I have examined patient, reviewed labs, studies and notes. I have discussed the case with Heather Zimmerman, and I agree with the data and plans as amended above. 71 yo obese woman, hx of OSA / OHS, diastolic CHF. Admitted with acute on chronic resp failure due to CPAP non-compliance, and UTI with septic shock. Required continuous BiPAP and pressors. has improved with consistent BiPAP use. Pressors  being weaned to off today. Continue abx and nocturnal BiPAP. Possibly out of ICU pm 3/19 if stable.   Baltazar Apo, MD, PhD 12/30/2016, 4:40 PM [place] [phone] or if no answer [phone]

## 2016-12-30 NOTE — Progress Notes (Signed)
Orthopedic Tech Progress Note Patient Details:  Heather Zimmerman. Achee May 06, 1946 [id number]  Ortho Devices Type of Ortho Device: Ace wrap, Unna boot Ortho Device/Splint Interventions: Application   [place] Pink 12/30/2016, 5:33 PM

## 2016-12-30 NOTE — Progress Notes (Signed)
Pt taken off bipap and placed on 4L [place] with no complications. VS within normal limits. RT will continue to monitor.

## 2016-12-30 NOTE — Progress Notes (Signed)
FPTS Interim Progress Note  Patient currently under care of CCM.  Family Medicine continues to follow socially.  Appreciate excellent care being provided by CCM.    Lovenia Kim, MD, PGY-1 12/30/2016, 12:48 PM Service pager [phone]

## 2016-12-30 NOTE — Progress Notes (Signed)
CRITICAL VALUE ALERT  Critical value received:  Lactic acid 2.2  Date of notification:  12/30/16  Time of notification:  1:41 AM  Critical value read back:Yes.    Nurse who received alert:  Leia Alf RN  MD notified (1st page):  Dr. Oletta Darter  Time of first page:  1:41 AM  Responding MD:  Dr. Oletta Darter  Time MD responded:  1:49 AM

## 2016-12-30 NOTE — Progress Notes (Signed)
   12/30/16 1110  Clinical Encounter Type  Visited With Patient  Visit Type Other (Comment) ([place] consult)  Spiritual Encounters  Spiritual Needs Emotional  Stress Factors  Patient Stress Factors Health changes  Introduction to Pt. Explained AD procedures. Pt will look over document. Follow up.

## 2016-12-30 NOTE — Progress Notes (Signed)
Initial Nutrition Assessment  DOCUMENTATION CODES:   Morbid obesity  INTERVENTION:    Pro-stat 30 ml BID with meals, each supplement provides 100 kcal and 15 gm protein.   Juven 1 packet PO BID, contains Arginine, Glutamine, HMB, vitamins (C, E, B12, Zinc), and protein to promote wound healing.   NUTRITION DIAGNOSIS:   Increased nutrient needs related to wound healing as evidenced by estimated needs.  GOAL:   Patient will meet greater than or equal to 90% of their needs  MONITOR:   PO intake, Supplement acceptance, Labs, Skin  REASON FOR ASSESSMENT:   Malnutrition Screening Tool, Low [place]    ASSESSMENT:   71 yo obese female with PMH of R BKA, OSA/OHV (on CPAP) who was admitted on 3/17 with sepsis syndrome and mild encephalopathy. Developed hypotension and respiratory distress requiring BiPAP on 3/18; required transfer to the ICU.  Discussed patient in ICU rounds and with RN today. Lactic acid trending down, now off BP medications, sepsis is resolving. Patient reports that she has been eating poorly for the past few months due to dislike of the food where she lives. She has lost ~13 lbs within the past month (4%), likely due to fluid status.  Nutrition-Focused physical exam completed. Findings are no fat depletion, no muscle depletion, and mild edema.  Discussed with patient the importance of eating enough protein to support wound healing. She requests Pro-stat and Juven supplements as she has had them in the past and knows they will support healing. She is hungry now.  Labs reviewed. CBG's: [id number] Medications reviewed.  Diet Order:  Diet Carb Modified Fluid consistency: Thin; Room service appropriate? Yes  Skin:  Wound (see comment) (venous stasis ulcers to L leg)  Last BM:  PTA  Height:   Ht Readings from Last 1 Encounters:  12/29/16 5' (1.524 m)    Weight:   Wt Readings from Last 1 Encounters:  12/29/16 297 lb 2.9 oz (134.8 kg)    Ideal Body  Weight:  42.5 kg  BMI:  62 (adjusted for BKA)  Estimated Nutritional Needs:   Kcal:  1800-2000  Protein:  120-140 gm  Fluid:  2 L  EDUCATION NEEDS:   Education needs addressed  Molli Barrows, RD, LDN, [place] Pager [phone] After Hours Pager [phone]

## 2016-12-30 NOTE — Progress Notes (Signed)
eLink Physician-Brief Progress Note Patient Name: Heather Zimmerman. Heather Zimmerman DOB: 03-18-1946 MRN: [id number]   Date of Service  12/30/2016  HPI/Events of Note  Lactic Acid = 3.6 >> 2.2.  eICU Interventions  Will continue present management.      Intervention Category Major Interventions: Acid-Base disturbance - evaluation and management  Nelva Hauk [place] 12/30/2016, 1:48 AM

## 2016-12-31 LAB — CBC WITH DIFFERENTIAL/PLATELET
Basophils Absolute: 0 10*3/uL (ref 0.0–0.1)
Basophils Relative: 0 %
Eosinophils Absolute: 0.5 10*3/uL (ref 0.0–0.7)
Eosinophils Relative: 6 %
HCT: 33.2 % — ABNORMAL LOW (ref 36.0–46.0)
HEMOGLOBIN: 9.8 g/dL — AB (ref 12.0–15.0)
LYMPHS ABS: 1 10*3/uL (ref 0.7–4.0)
Lymphocytes Relative: 14 %
MCH: 27.5 pg (ref 26.0–34.0)
MCHC: 29.5 g/dL — ABNORMAL LOW (ref 30.0–36.0)
MCV: 93.3 fL (ref 78.0–100.0)
Monocytes Absolute: 0.5 10*3/uL (ref 0.1–1.0)
Monocytes Relative: 7 %
NEUTROS ABS: 5.2 10*3/uL (ref 1.7–7.7)
NEUTROS PCT: 73 %
Platelets: 141 10*3/uL — ABNORMAL LOW (ref 150–400)
RBC: 3.56 MIL/uL — AB (ref 3.87–5.11)
RDW: 17.3 % — ABNORMAL HIGH (ref 11.5–15.5)
WBC: 7.2 10*3/uL (ref 4.0–10.5)

## 2016-12-31 LAB — GLUCOSE, CAPILLARY
GLUCOSE-CAPILLARY: 101 mg/dL — AB (ref 65–99)
GLUCOSE-CAPILLARY: 110 mg/dL — AB (ref 65–99)
GLUCOSE-CAPILLARY: 133 mg/dL — AB (ref 65–99)
Glucose-Capillary: 138 mg/dL — ABNORMAL HIGH (ref 65–99)
Glucose-Capillary: 130 mg/dL — ABNORMAL HIGH (ref 65–99)
Glucose-Capillary: 167 mg/dL — ABNORMAL HIGH (ref 65–99)

## 2016-12-31 LAB — PROCALCITONIN: Procalcitonin: 1.39 ng/mL

## 2016-12-31 LAB — BASIC METABOLIC PANEL
Anion gap: 7 (ref 5–15)
BUN: 22 mg/dL — ABNORMAL HIGH (ref 6–20)
CO2: 29 mmol/L (ref 22–32)
Calcium: 8.5 mg/dL — ABNORMAL LOW (ref 8.9–10.3)
Chloride: 109 mmol/L (ref 101–111)
Creatinine, Ser: 1.38 mg/dL — ABNORMAL HIGH (ref 0.44–1.00)
GFR calc Af Amer: 43 mL/min — ABNORMAL LOW (ref >60)
GFR calc non Af Amer: 37 mL/min — ABNORMAL LOW (ref >60)
Glucose, Bld: 108 mg/dL — ABNORMAL HIGH (ref 65–99)
Potassium: 3.7 mmol/L (ref 3.5–5.1)
Sodium: 145 mmol/L (ref 135–145)

## 2016-12-31 LAB — PHOSPHORUS: Phosphorus: 2 mg/dL — ABNORMAL LOW (ref 2.5–4.6)

## 2016-12-31 LAB — MAGNESIUM: Magnesium: 2.2 mg/dL (ref 1.7–2.4)

## 2016-12-31 MED ORDER — TORSEMIDE 20 MG PO TABS
30.0000 mg | ORAL_TABLET | Freq: Every day | ORAL | Status: DC
  Administered 2016-12-31 – 2017-01-03 (×4): 30 mg via ORAL
  Filled 2016-12-31 (×4): qty 2
  Filled 2016-12-31: qty 1

## 2016-12-31 MED ORDER — IPRATROPIUM-ALBUTEROL 0.5-2.5 (3) MG/3ML IN SOLN: 3.0000 mL | RESPIRATORY_TRACT | Status: DC | PRN

## 2016-12-31 MED ORDER — POTASSIUM CHLORIDE CRYS ER 20 MEQ PO TBCR
20.0000 meq | EXTENDED_RELEASE_TABLET | Freq: Every day | ORAL | Status: DC
  Administered 2016-12-31 – 2017-01-03 (×4): 20 meq via ORAL
  Filled 2016-12-31 (×4): qty 1

## 2016-12-31 MED ORDER — MOMETASONE FURO-FORMOTEROL FUM 200-5 MCG/ACT IN AERO
2.0000 | INHALATION_SPRAY | Freq: Two times a day (BID) | RESPIRATORY_TRACT | Status: DC
  Administered 2016-12-31 – 2017-01-03 (×7): 2 via RESPIRATORY_TRACT
  Filled 2016-12-31 (×2): qty 8.8

## 2016-12-31 NOTE — Clinical Social Work Note (Signed)
Clinical Social Work Assessment  Patient Details  Name: Heather Zimmerman. Heldman MRN: [id number] Date of Birth: [birth year]  Date of referral:  12/30/16               Reason for consult:  Facility Placement, Discharge Planning                Permission sought to share information with:  Case Manager, Customer service manager, Family Supports Permission granted to share information::  Yes, Verbal Permission Granted  Name::        Agency::     Relationship::     Contact Information:     Housing/Transportation Living arrangements for the past 2 months:  [place] of Information:  Patient, Medical Team, Facility, Case Manager Patient Interpreter Needed:  None Criminal Activity/Legal Involvement Pertinent to Current Situation/Hospitalization:  No - Comment as needed Significant Relationships:  Other Family Members Lives with:  Facility Resident Do you feel safe going back to the place where you live?  No Need for family participation in patient care:  No (Coment)  Care giving concerns: Patient is a long-term resident at [facility]  Patient reports that "ever since they changed from [facility], it just has not been the same. They don't keep the same staff".   Social Worker assessment / plan:  Patient is a 71 YO female admitted with sepsis- suspected source LLE cellulitis. 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 Patient is a long-term resident at [facility] CSW engaged with Patient at her bedside. CSW introduced self, role of CSW, and discussed Patient's concerns about current SNF placement. Patient reports an interest in going to a different nursing home. Per Patient, "ever since they changed from [facility], it just has not been the same. They don't keep the same staff". Patient reports that she likes consistency and does not like a lot of change.   Patient  expressed interest in a skilled nursing facility in [place], [place]. [place] discussed this option with Patient. CSW also explained to Patient that she would have to pay for non-emergent transport to a SNF facility in [place]. In the past, Patient has reported that she is unable to pay out of pocket for non-emergent ambulance transport to [place], [place].   Per facility, Patient is welcome to return to [facility], however, they do note that every time Patient comes to the hospital, she states that she does not want to return and wants to be closer to home/friends. Facility reports that they are working on [employer] and notes that in the past, Patient has been declined by other facilities due to her obesity.   CSW discussed SNF referral process with Patient. CSW to make referral to local SNF facilities and discuss options with Patient once received. Patient states she is not sure what she wants to do but is agreeable to referrals being sent to local SNFs. CSW continues to follow.   Employment status:  Retired Forensic scientist:  Information systems manager, Medicaid In [place] PT Recommendations:  Not assessed at this time [place] / Referral to community resources:  [place]  Patient/Family's Response to care:  Patient appreciative of care received at this time.   Patient/Family's Understanding of and Emotional Response to Diagnosis, Current Treatment, and Prognosis:  Patient verbalizes understanding of diagnosis, current treatment, and prognosis. Patient reports an interest in going to a different nursing home.   Emotional Assessment Appearance:  Appears stated age Attitude/Demeanor/Rapport:  Lethargic (Cooperative) Affect (typically observed):  Calm, Stable, Flat Orientation:  Oriented to  Time, Oriented to Place, Oriented to Self Alcohol / Substance use:  Not Applicable Psych involvement (Current and /or in the community):  No (Comment)  Discharge Needs  Concerns to be addressed:   Discharge Planning Concerns, Care Coordination Readmission within the last 30 days:  No Current discharge risk:  Physical Impairment Barriers to Discharge:  Continued Medical Work up   [employer], LCSW 12/31/2016, 2:26 PM

## 2016-12-31 NOTE — Progress Notes (Signed)
PULMONARY / CRITICAL CARE MEDICINE   Name: Heather Zimmerman. Heather Zimmerman MRN: [id number] DOB: January 09, 1946    ADMISSION DATE:  12/28/2016 CONSULTATION DATE:  3/18  REFERRING MD:  Dr. 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 Pt presents with fever-102 , altered mental status and LLE chronic wounds. Her LA was elevated 2.41 on admission , she was given IVF with 1L NS intially . b/p remains soft in 41s . She was given IVF overnight with b/p remaining soft in upper 80s-low 90s. She has had a total of 3.5 L of IV NS . She was placed on BIPAP support overnight with continued altered mental status. She was started on Vanc and Zosyn. She repsonds to voice this am and does follow some simple commands. LA remains elevated 2.4>1.9 >2.7 .  ABG on BIPAP this am showed pH 7.3, PCO2 58, PO2 80.  Flu swab neg. CT head was neg. CXR w/ mild CHF pattern. D Dimer was elevated but unable to do CTA Chest due to kidney fxn . Felt VQ would not be accurate due to morbid obesity. PCCM asked to consult . Pt had similar admission last month.   SUBJECTIVE/INTERVAL:  Off pressors. Complains of leg swelling.  VITAL SIGNS: BP (!) 129/94   Pulse 74   Temp 99.5 F (37.5 C) (Axillary)   Resp (!) 21   Ht 5' (1.524 m)   Wt 134.8 kg (297 lb 2.9 oz)   LMP  (LMP Unknown)   SpO2 100%   BMI 58.04 kg/m   HEMODYNAMICS:    VENTILATOR SETTINGS: FiO2 (%):  [28 %] 28 %  INTAKE / OUTPUT: I/O last 3 completed shifts: In: 5353.5 [P.O.:740; I.V.:2233.5; Other:130; IV Piggyback:2250] Out: 2030 [Urine:2030]  PHYSICAL EXAMINATION:  General:  Morbidly obese female in NAD  Neuro: Alert, oriented, non-focal.  HEENT:  [place]/AT, No JVD noted Cardiovascular:  RRR, no MRG Lungs:   Distant, but clear breath sounds. Normal effort Abdomen:  Obese, soft, non-tender Musculoskeletal:  No acute deformity, remote RLE amputation Skin:  Intact, MMM. Remote skin graft sites BLE.   LABS:  BMET  Recent Labs Lab 12/29/16 1535 12/30/16 0059 12/31/16 0213  NA 142 144 145  K 4.0 3.9 3.7  CL 106 111 109  CO2 28 26 29   BUN 34* 30* 22*  CREATININE 1.93* 1.73* 1.38*  GLUCOSE 123* 135* 108*    Electrolytes  Recent Labs Lab 12/29/16 0101  12/29/16 1535 12/30/16 0059 12/30/16 0646 12/31/16 0213  CALCIUM  --   < > 7.8* 8.0*  --  8.5*  MG 1.9  --   --   --  2.1 2.2  PHOS  --   --   --   --  2.9 2.0*  < > = values in this interval not displayed.  CBC  Recent Labs Lab 12/29/16 0637 12/30/16 0646 12/31/16 0213  WBC 20.1* 12.8* 7.2  HGB 10.1* 9.7* 9.8*  HCT 33.4* 32.4* 33.2*  PLT 163 147* 141*    Coag's No results for input(s): APTT, INR in the last 168 hours.  Sepsis Markers  Recent Labs Lab 12/29/16 1535 12/29/16 2159 12/30/16 0059 12/30/16 0646 12/31/16 0213  LATICACIDVEN 2.6* 3.6* 2.2*  --   --  PROCALCITON 3.23  --   --  2.10 1.39    ABG  Recent Labs Lab 12/28/16 2354  PHART 7.373  PCO2ART 58.6*  PO2ART 80.0*    Liver Enzymes  Recent Labs Lab 12/28/16 1621  AST 34  ALT 22  ALKPHOS 167*  BILITOT 1.2  ALBUMIN 3.1*    Cardiac Enzymes  Recent Labs Lab 12/29/16 0637 12/29/16 1535 12/30/16 0646  TROPONINI <0.03 0.03* 0.04*    Glucose  Recent Labs Lab 12/30/16 1141 12/30/16 1526 12/30/16 1954 12/30/16 2343 12/31/16 0323 12/31/16 0840  GLUCAP 106* 112* 177* 124* 110* 101*    Imaging No results found.   STUDIES:  CT head 3/17 >neg   CULTURES: 3/17 BC >> 3/17 UC >>  ANTIBIOTICS: 3/17 Zosyn .> 3/17 Vanc >>  SIGNIFICANT EVENTS: 3/18 BIPAP started, transfer to ICU   LINES/TUBES:   DISCUSSION: 71 yo female SNF pt readmitted with Sepsis w/ suspected cellulitis source and acute on chronic hypercarbic  /hypoxic respiratory failure with known OSA/OHS. She was transferred to ICU on BiPAP for hypercarbic failure. She also required phenylephrine for shock presumed to be septic in setting of cellulitis. She improved and was no longer requiring BiPAP/pressors 3/19.   ASSESSMENT / PLAN:  PULMONARY  A: Acute on chronic hypercarbic/hypoxic respiratory failure  OSA/OHS w/ morbid obesity -reported CPAP noncompliance Asthma on Advair   P:   Continue supplemental O2 titrated to O2 sats 88-95% Continue QHS CPAP Will DC nebs and restart home regimen of advair (dulera inpatient) and PRN duoneb  CARDIOVASCULAR A:  CHF w/ preserved EF , gr 1 DD - echo 07/2016  Volume overload Septic Shock > resolved P:  Telemetry monitoring Will order home torsemide and K supplemenatation  RENAL A:   Acute on Chronic Kidney (baseline 1-1.4)   P:   KVO IVF Follow BMP  GASTROINTESTINAL A:   Nutrition   P:   HH carb mod diet  HEMATOLOGIC A:   Anemia  Elevated D Dimer -unable to do CTA Chest / VQ ?accuracy with wt. Oxygenation intact so doubt PE  P:  Lovenox for VTE ppx Follow CBC  INFECTIOUS A:   Sepsis w/ suspected LLE cellulitis as source (xray in 10/2016 neg for osteo)  P:   Continue broad ABX as above. PCT trending down but remains elevated. Follow cx data  Cont wound care  May need to re-image LLE  ENDOCRINE A:   DM  P:   Continue SSI   NEUROLOGIC A:   Acute Encephalopathy (nml Ammonia, B12, ethanol), CT head neg. - most likely due to hypotension/sepsis/hypercarbia (resolved)   P:   Cont to hold gabapentin and cymbalta  Monitor  FAMILY  - Updates: patient updated at bedside  - Inter-disciplinary family meet or Palliative Care meeting due by:  01/05/17  Will transfer out to med-surg and inform the FMTS  Heather Zimmerman, AGACNP-BC [employer] [facility]/Critical [facility] Pager [phone] or [phone]  12/31/2016 10:20 AM  Attending Note:  I have examined patient,  reviewed labs, studies and notes. I have discussed the case with Jaclynn Guarneri, and I agree with the data and plans as amended above. 71 year old woman with a history of OSA/OHS, hypertension, diastolic CHF. She was admitted with cor pulmonale, acute on chronic respiratory failure in the setting of severe sepsis and urinary tract infection. She has poor outpatient CPAP compliance. She required continuous BiPAP initially, now weaned to BiPAP daily at bedtime. She was also on pressors for short period of time. She is  improved, pressors have been weaned off. On my evaluation she is awake and interactive. She did tolerate BiPAP last night. Lungs are distant. She does have some bilateral pretibial edema. We will continue her current broad antibiotics. Continue scheduled bronchodilators. She will likely transition back to Advair the time of discharge. It is critical that she have good CPAP compliance every night after she is discharged home. She is ready to transition out of the ICU today.   Baltazar Apo, MD, PhD 12/31/2016, 1:33 PM [facility] [phone] or if no answer [phone]

## 2016-12-31 NOTE — Progress Notes (Signed)
Pt taken off bipap and placed on 2L [place]. Pt stable throughout with no complications. VS within normal limits. RT will continue to monitor.

## 2016-12-31 NOTE — Progress Notes (Signed)
FPTS Interim Progress Note  Per CCM note, patient doing well off BiPAP.  Family Medicine continues to follow while CCM primary.  Appreciate excellent care being provided for her.   Lovenia Kim, MD 12/31/2016, 7:59 AM PGY-1, [facility] Service pager [phone]

## 2016-12-31 NOTE — Progress Notes (Signed)
   12/31/16 1150  Clinical Encounter Type  Visited With Patient  Visit Type Follow-up  Spiritual Encounters  Spiritual Needs Emotional  Stress Factors  Patient Stress Factors Health changes  Follow up visit on Advanced Directive. Pt reports she is looking over documentation later.

## 2016-12-31 NOTE — NC FL2 (Signed)
[place] LEVEL OF CARE SCREENING TOOL     IDENTIFICATION  Patient Name: Heather Zimmerman Birthdate: [birth year] Sex: female Admission Date (Current Location): 12/28/2016  [place] and [place] Number:  [name] [id number] [place] and Address:  [facility]. [facility], [place] [address] [place], [place] [address]      Provider Number: [id number]  Attending Physician Name and Address:  Brand Males, MD  Relative Name and Phone Number:       Current Level of Care: Hospital Recommended Level of Care: [place] Prior Approval Number:    Date Approved/Denied:   PASRR Number: [id number]  Discharge Plan: SNF    Current Diagnoses: Patient Active Problem List   Diagnosis Date Noted  . Positive D dimer   . Altered mental status 12/28/2016  . Acute renal failure superimposed on stage 3 chronic kidney disease ([place])   . Chronic respiratory failure with hypoxia ([place])   . Cellulitis of left thigh 09/23/2016  . UTI (urinary tract infection) 09/23/2016  . Hypoglycemia   . Oropharyngeal dysphagia   . Bacteremia   . Coag negative Staphylococcus bacteremia   . Chronic venous stasis dermatitis   . Respiratory failure ([place]) 07/20/2016  . Acute respiratory failure with hypercapnia ([place]) 07/20/2016  . Weakness of right upper extremity 07/09/2016  . Type II diabetes mellitus with neurological manifestations, uncontrolled ([place]) 04/23/2016  . Ventral hernia without obstruction or gangrene 02/24/2016  . Chronic respiratory failure with hypercapnia ([place]) 02/24/2016  . Occult blood in stools 12/26/2015  . Hypoxemia   . 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 CKD (chronic kidney disease) stage 3, GFR 30-59 ml/min 07/21/2015  . Cellulitis of left lower extremity 07/19/2015  . Acute encephalopathy 07/18/2015  . Open wound of left lower extremity 07/18/2015  . Sepsis ([place]) 07/18/2015  .  Venous ulcer of left leg ([place]) 12/27/2014  . Hx of right BKA ([place]) 11/22/2014  . Peripheral autonomic neuropathy due to diabetes mellitus ([place]) 10/09/2014  . OSA on CPAP 07/05/2014  . Acute on chronic respiratory failure with hypercapnia ([place]) 06/21/2014  . Morbid obesity ([place]) 08/30/2013  . Unspecified vitamin D deficiency 12/31/2012  . Disorder of magnesium metabolism 12/31/2012  . Depression 12/31/2012  . Chronic pain 12/31/2012  . Chronic diastolic heart failure ([place]) 12/31/2012  . GERD 12/31/2012    Orientation RESPIRATION BLADDER Height & Weight     Self, Situation, Place, Time  Other (Comment), O2 (2L Nasal Cannula; QHS CPAP) Continent, Indwelling catheter Weight: 297 lb 2.9 oz (134.8 kg) Height:  5' (152.4 cm)  BEHAVIORAL SYMPTOMS/MOOD NEUROLOGICAL BOWEL NUTRITION STATUS   (NONE)   Incontinent Supplemental (Heart Healthy Carb Modified; Feeding Supplement PRO-STAT Sugar Free 64 liquid 30 mL 2x daily)  AMBULATORY STATUS COMMUNICATION OF NEEDS Skin   Total Care Verbally Skin abrasions, Other (Comment) (skin cellulitis MASD left lower leg; venous stasis ulcer left leg- kerlex dressing daily)                       Personal Care Assistance Level of Assistance  Bathing, Feeding, Dressing Bathing Assistance: Maximum assistance Feeding assistance: Independent Dressing Assistance: Maximum assistance     Functional Limitations Info  Sight, Hearing, Speech Sight Info: Impaired Hearing Info: Adequate Speech Info: Adequate    SPECIAL CARE FACTORS FREQUENCY  PT (By licensed PT), OT (By licensed OT)  Contractures Contractures Info: Not present    Additional Factors Info  Code Status, Allergies, Insulin Sliding Scale Code Status Info: FULL Allergies Info: Ace Inhibitors   Insulin Sliding Scale Info: novoLOG injection 0-15 units every 4 hrs       Current Medications (12/31/2016):  This is the current hospital active medication list Current  Facility-Administered Medications  Medication Dose Route Frequency Provider Last Rate Last Dose  . aspirin EC tablet 81 mg  81 mg Oral Daily Nicolette Bang, DO   81 mg at 12/31/16 1024  . chlorhexidine (PERIDEX) 0.12 % solution 15 mL  15 mL Mouth Rinse BID Brand Males, MD   15 mL at 12/30/16 2238  . enoxaparin (LOVENOX) injection 40 mg  40 mg Subcutaneous Daily Nicolette Bang, DO   40 mg at 12/31/16 1024  . feeding supplement (PRO-STAT SUGAR FREE 64) liquid 30 mL  30 mL Oral BID Collene Gobble, MD   30 mL at 12/31/16 0847  . insulin aspart (novoLOG) injection 0-15 Units  0-15 Units Subcutaneous Q4H Nicolette Bang, DO   2 Units at 12/31/16 1221  . ipratropium-albuterol (DUONEB) 0.5-2.5 (3) MG/3ML nebulizer solution 3 mL  3 mL Nebulization Q4H PRN Corey Harold, NP      . MEDLINE mouth rinse  15 mL Mouth Rinse q12n4p Brand Males, MD   15 mL at 12/30/16 1600  . mometasone-formoterol (DULERA) 200-5 MCG/ACT inhaler 2 puff  2 puff Inhalation BID Corey Harold, NP   2 puff at 12/31/16 1159  . nutrition supplement (JUVEN) (JUVEN) powder packet 1 packet  1 packet Oral BID BM Collene Gobble, MD   1 packet at 12/31/16 1024  . phenylephrine (NEO-SYNEPHRINE) 10 mg in sodium chloride 0.9 % 250 mL (0.04 mg/mL) infusion  0-200 mcg/min Intravenous Titrated Brand Males, MD   Stopped at 12/30/16 1410  . piperacillin-tazobactam (ZOSYN) IVPB 3.375 g  3.375 g Intravenous Q8H Romona Curls, RPH   3.375 g at 12/31/16 1222  . potassium chloride SA (K-DUR,KLOR-CON) CR tablet 20 mEq  20 mEq Oral Daily Corey Harold, NP   20 mEq at 12/31/16 1221  . sodium chloride flush (NS) 0.9 % injection 3 mL  3 mL Intravenous Q12H Nicolette Bang, DO   3 mL at 12/30/16 2239  . torsemide (DEMADEX) tablet 30 mg  30 mg Oral Daily Corey Harold, NP   30 mg at 12/31/16 1222  . vancomycin (VANCOCIN) 1,500 mg in sodium chloride 0.9 % 500 mL IVPB  1,500 mg Intravenous Q24H Romona Curls, RPH    1,500 mg at 12/30/16 2100     Discharge Medications: Please see discharge summary for a list of discharge medications.  Relevant Imaging Results:  Relevant Lab Results:   Additional Information SS#: [id number]  Lind Covert, LCSW

## 2016-12-31 NOTE — Progress Notes (Signed)
Pharmacy Antibiotic Note  Heather Zimmerman. Heinke is a 71 y.o. female from NH with hx of R BKA and non-mobile admitted on 12/28/2016 with cellulitis.  On day #4 of vanc/zosyn, all cultures remain ngtd. SCr improving 1.9 > 1.3.    Plan: Zosyn 3.375g IV q8h Vancomycin  1500mg  IV q24h Monitor clinical progress, c/s, renal function, abx plan/LOT Hold off on obtaining a vancomycin level as vanc can likely be discontinued soon   Height: 5' (152.4 cm) Weight: 297 lb 2.9 oz (134.8 kg) IBW/kg (Calculated) : 45.5  Temp (24hrs), Avg:98.9 F (37.2 C), Min:98 F (36.7 C), Max:99.5 F (37.5 C)   Recent Labs Lab 12/28/16 1621  12/28/16 1930  12/29/16 0637 12/29/16 0954 12/29/16 1535 12/29/16 2159 12/30/16 0059 12/30/16 0646 12/31/16 0213  WBC 18.2*  --   --   --  20.1*  --   --   --   --  12.8* 7.2  CREATININE 1.51*  < > 1.60*  --  1.77*  --  1.93*  --  1.73*  --  1.38*  LATICACIDVEN  --   < >  --   < > 1.6 2.4* 2.6* 3.6* 2.2*  --   --   < > = values in this interval not displayed.  Estimated Creatinine Clearance: 47.9 mL/min (A) (by C-G formula based on SCr of 1.38 mg/dL (H)).    Allergies  Allergen Reactions  . Ace Inhibitors Other (See Comments)    unknown      Hughes Better, PharmD, BCPS Clinical Pharmacist 12/31/2016 1:39 PM

## 2017-01-01 ENCOUNTER — Inpatient Hospital Stay (HOSPITAL_COMMUNITY): Payer: Medicare Other

## 2017-01-01 LAB — CBC WITH DIFFERENTIAL/PLATELET
BASOS ABS: 0 10*3/uL (ref 0.0–0.1)
Basophils Relative: 0 %
EOS PCT: 7 %
Eosinophils Absolute: 0.3 10*3/uL (ref 0.0–0.7)
HCT: 32.4 % — ABNORMAL LOW (ref 36.0–46.0)
HEMOGLOBIN: 9.9 g/dL — AB (ref 12.0–15.0)
LYMPHS PCT: 21 %
Lymphs Abs: 1 10*3/uL (ref 0.7–4.0)
MCH: 27.6 pg (ref 26.0–34.0)
MCHC: 30.6 g/dL (ref 30.0–36.0)
MCV: 90.3 fL (ref 78.0–100.0)
Monocytes Absolute: 0.4 10*3/uL (ref 0.1–1.0)
Monocytes Relative: 8 %
NEUTROS ABS: 3.2 10*3/uL (ref 1.7–7.7)
Neutrophils Relative %: 64 %
PLATELETS: 155 10*3/uL (ref 150–400)
RBC: 3.59 MIL/uL — AB (ref 3.87–5.11)
RDW: 15.8 % — ABNORMAL HIGH (ref 11.5–15.5)
WBC: 4.9 10*3/uL (ref 4.0–10.5)

## 2017-01-01 LAB — BASIC METABOLIC PANEL
Anion gap: 10 (ref 5–15)
BUN: 17 mg/dL (ref 6–20)
CO2: 26 mmol/L (ref 22–32)
Calcium: 8.8 mg/dL — ABNORMAL LOW (ref 8.9–10.3)
Chloride: 106 mmol/L (ref 101–111)
Creatinine, Ser: 1.21 mg/dL — ABNORMAL HIGH (ref 0.44–1.00)
GFR calc Af Amer: 51 mL/min — ABNORMAL LOW (ref >60)
GFR, EST NON AFRICAN AMERICAN: 44 mL/min — AB (ref >60)
Glucose, Bld: 162 mg/dL — ABNORMAL HIGH (ref 65–99)
POTASSIUM: 3.5 mmol/L (ref 3.5–5.1)
Sodium: 142 mmol/L (ref 135–145)

## 2017-01-01 LAB — GLUCOSE, CAPILLARY
GLUCOSE-CAPILLARY: 108 mg/dL — AB (ref 65–99)
GLUCOSE-CAPILLARY: 117 mg/dL — AB (ref 65–99)
GLUCOSE-CAPILLARY: 132 mg/dL — AB (ref 65–99)
GLUCOSE-CAPILLARY: 166 mg/dL — AB (ref 65–99)
Glucose-Capillary: 152 mg/dL — ABNORMAL HIGH (ref 65–99)
Glucose-Capillary: 152 mg/dL — ABNORMAL HIGH (ref 65–99)
Glucose-Capillary: 159 mg/dL — ABNORMAL HIGH (ref 65–99)

## 2017-01-01 LAB — VANCOMYCIN, TROUGH: Vancomycin Tr: 20 ug/mL (ref 15–20)

## 2017-01-01 LAB — PHOSPHORUS: Phosphorus: 2.7 mg/dL (ref 2.5–4.6)

## 2017-01-01 LAB — MAGNESIUM: MAGNESIUM: 1.6 mg/dL — AB (ref 1.7–2.4)

## 2017-01-01 MED ORDER — IOPAMIDOL (ISOVUE-300) INJECTION 61%
INTRAVENOUS | Status: AC
  Administered 2017-01-01: 100 mL
  Filled 2017-01-01: qty 100

## 2017-01-01 MED ORDER — NYSTATIN 100000 UNIT/GM EX POWD
Freq: Three times a day (TID) | CUTANEOUS | Status: DC
Start: 2017-01-01 — End: 2017-01-03
  Administered 2017-01-01 – 2017-01-02 (×5): via TOPICAL
  Filled 2017-01-01: qty 15

## 2017-01-01 MED ORDER — VANCOMYCIN HCL IN DEXTROSE 1-5 GM/200ML-% IV SOLN
1000.0000 mg | INTRAVENOUS | Status: DC
  Administered 2017-01-01: 1000 mg via INTRAVENOUS
  Filled 2017-01-01: qty 200

## 2017-01-01 NOTE — Progress Notes (Signed)
Family Medicine Teaching Service Daily Progress Note Intern Pager: [phone]  Patient name: Heather Zimmerman. [place] record number: [id number] Date of birth: 09-Dec-1945 Age: 71 y.o. Gender: female  Primary Care Provider: Gildardo Cranker, DO Consultants: CCM Code Status: FULL   Pt Overview and Major Events to Date:  Admit 3/17 FPTS>ICU 3/18 ICU>FPTS 3/21 Vanc/Zosyn (3/17>)  Assessment and Plan: Heather Zimmerman. [name] is a 71 y.o. female presenting with altered mental status found to have hypercarbic respiratory failure and meeting sepsis criteria . PMH is significant for HFpEF, OSA on CPAP, chronic respiratory failure on 2L O2 at home, chronic left lower extremity wounds, s/p right BKA, T2DM, depression, and CKD.   Sepsis, improving: Suspected source: LLE cellulitis.  On arrivral, meeting SIRS criteria with fever (Tmax 101.3), tachycardia, tachypnea and leukocytosis to 18. Lactic acidosis 2.41 > 1.94. Qsofa score of 2 due to AMS and increased RR. CXR without consolidation or infiltrates suggestive of PNA. UA not concerning for infectious process. Was started on Vanc/Zosyn overnight with plan to narrow antibiotics, and admitted to SDU but transferred to ICU for decreased responsiveness, sepsis, hypotension and requiring BiPAP.   LA rising to 3.6 and BP persistently low despite neo.  Titrated to maintain MAP >65.  White count improved to 4.9 and patient has remained afebrile.   -On 3/19 taken off Bipap and placed on 4L per [place], tolerated well with VSS.  Is now on her home 2L O2.  -Day #6 Vanc/zosyn.  CT of left LE ordered.  Consider de-escalating antibiotic to Bactrim once CT results.   -influenza panel negative, blood and urine cultures both NG to date.    Acute Encephalopathy, resolved.  -Normal Ammonia, B12, ethanol and negative head CT.  AMS most likely secondary to hypotension/sepsis/hypercarbia.   -Continue to hold home Gabapentin and Cymbalta.   Acute on Chronic Hypercarbic/Hypoxic Respiratory  Failure with known OSA/OHS, improved: VBG at presentation: pH 7.37, pCO2 68.5, pO2 51.0, bicarb 39.7. CXR with trace bilateral pleural effusions and mild pulmonary edema.  -BiPAP prn  -Duonebs q4h prn/albuterol q2h prn  -continue Dulera 2 puffs BID (substitute for home Advair).  Will likely transition to Advair at time of discharge.   Left LE Cellulitis: With chronic skin changes. No draining wounds or ulcers present on admission. Patient reportedly in [place] prior to arrival. Discharged with course of Doxycyline on 2/6 to treat cellulitis. Negative left LE X-rays from 1/30 for osteomyelitis.   -Continue broad spectrum abx as above, can de-escalate to Bactrim once improved.  -CT left femur with contrast ordered  -Procalcitonin trending down -Cxs negative to date  -wound care consult   AoCKD: Patient with history of CKD (bl difficult to access but appears to be between 1.0-1.4). Cr at presentation 1.6. Suspect pre-renal etiology from presumed poor PO intake as patient has apparently been altered for >24h prior to presentation.  -KVO IVFs  HFpEF: Most recent echo (Oct 2017) with LVEF 60-65%, G1DD, and normal pulmonary artery pressure. Patient takes Torsemide 30 mg daily. CXR with trace bilateral pleural effusions and mild pulmonary edema. -Continue home Torsemide 30 mg daily   T2DM: Takes Lantus 26u nightly and Novolog SSI at home. CBGs 159-194 while in ER.  -holding long acting insulin while NPO  -Continue mod SSI  -hold gabapentin given acute encephalopathy   OSA:  -CPAP nightly  -Patient is noncompliant with Cpap at home but will stress this to her.   Depression: -holding Cymbalta due to AMS   Morbid Obesity:  -PT/OT  consult  -SW consult as anticipate return to SNF   FEN/GI: Carb mod, prostat and juven supplement,  KVO  Prophylaxis: Lovenox   Disposition: Pending clinical improvement.   Subjective:  Patient laying in hospital bed, appears comfortable.  Is on her home  2L O2 per [place].   Objective: Temp:  [97.5 F (36.4 C)-98.5 F (36.9 C)] 97.5 F (36.4 C) (03/21 0543) Pulse Rate:  [68-95] 68 (03/21 0543) Resp:  [16-20] 20 (03/21 0543) BP: (127-152)/(63-75) 144/63 (03/21 0543) SpO2:  [97 %-100 %] 98 % (03/21 0942)   Physical Exam: General: morbidly obese 71 yo F laying in hospital bed, in NAD, [place] in place  Cardiovascular: RRR no MRG Respiratory: Distant but clear, no increased work of breathing, no use of accessory muscles, on 2L O2 per [place]  Abdomen: soft, NT, ND, +bs MSK: Right BKA  Extremities: Left leg with chronic venous changes and 1+ pitting edema  Skin: warm, dry Neuro: alert, oriented  Laboratory:  Recent Labs Lab 12/30/16 0646 12/31/16 0213 01/01/17 0948  WBC 12.8* 7.2 4.9  HGB 9.7* 9.8* 9.9*  HCT 32.4* 33.2* 32.4*  PLT 147* 141* 155    Recent Labs Lab 12/28/16 1621  12/30/16 0059 12/31/16 0213 01/01/17 0948  NA 141  < > 144 145 142  K 4.8  < > 3.9 3.7 3.5  CL 98*  < > 111 109 106  CO2 32  < > 26 29 26   BUN 33*  < > 30* 22* 17  CREATININE 1.51*  < > 1.73* 1.38* 1.21*  CALCIUM 9.5  < > 8.0* 8.5* 8.8*  PROT 8.7*  --   --   --   --   BILITOT 1.2  --   --   --   --   ALKPHOS 167*  --   --   --   --   ALT 22  --   --   --   --   AST 34  --   --   --   --   GLUCOSE 183*  < > 135* 108* 162*  < > = values in this interval not displayed.  Imaging/Diagnostic Tests: No results found.  Lovenia Kim, MD 01/01/2017, 5:04 PM PGY-1, [place] Intern pager: [phone], text pages welcome

## 2017-01-01 NOTE — Care Management Important Message (Signed)
Important Message  Patient Details  Name: Heather Zimmerman. [place] MRN: [id number] Date of Birth: 06/30/1946   Medicare Important Message Given:  Yes    [name] Montine Circle 01/01/2017, 12:25 PM

## 2017-01-01 NOTE — Progress Notes (Signed)
Pharmacy Antibiotic Note  Heather Zimmerman is a 71 y.o. female from NH with hx of R BKA and non-mobile admitted on 12/28/2016 with cellulitis.  On day #5 of vanc/zosyn, all cultures remain ngtd. SCr improving 1.9 > 1.21.  Vancomycin trough obtained ~ 22 hrs after last dose.   Plan: Zosyn 3.375g IV q8h Reduce vancomycin to 1g q 24 hrs. Monitor clinical progress, c/s, renal function, abx plan/LOT   Height: 5' (152.4 cm) Weight: 297 lb 2.9 oz (134.8 kg) IBW/kg (Calculated) : 45.5  Temp (24hrs), Avg:98 F (36.7 C), Min:97.5 F (36.4 C), Max:98.5 F (36.9 C)   Recent Labs Lab 12/28/16 1621  12/29/16 0637 12/29/16 0954 12/29/16 1535 12/29/16 2159 12/30/16 0059 12/30/16 0646 12/31/16 0213 01/01/17 0948 01/01/17 1859  WBC 18.2*  --  20.1*  --   --   --   --  12.8* 7.2 4.9  --   CREATININE 1.51*  < > 1.77*  --  1.93*  --  1.73*  --  1.38* 1.21*  --   LATICACIDVEN  --   < > 1.6 2.4* 2.6* 3.6* 2.2*  --   --   --   --   VANCOTROUGH  --   --   --   --   --   --   --   --   --   --  20  < > = values in this interval not displayed.  Estimated Creatinine Clearance: 54.7 mL/min (A) (by C-G formula based on SCr of 1.21 mg/dL (H)).    Allergies  Allergen Reactions  . Ace Inhibitors Other (See Comments)    unknown   Antimicrobials this admission:  Vanc 3/17 >> Zosyn 3/17 >>  Dose adjustments this admission:  3/21 Vanc trough = 20 on Vanc 1500 mg q 24 hrs; reduce to   Microbiology results:  3/17 BCx x 2: ngtd 3/17 UCx: Neg  Sputum: **  3/18 MRSA PCR: neg  Uvaldo Rising, BCPS  Clinical Pharmacist Pager [phone]  01/01/2017 8:05 PM

## 2017-01-02 LAB — CBC WITH DIFFERENTIAL/PLATELET
BASOS ABS: 0 10*3/uL (ref 0.0–0.1)
BASOS PCT: 0 %
Eosinophils Absolute: 0.4 10*3/uL (ref 0.0–0.7)
Eosinophils Relative: 7 %
HEMATOCRIT: 33 % — AB (ref 36.0–46.0)
HEMOGLOBIN: 10.1 g/dL — AB (ref 12.0–15.0)
Lymphocytes Relative: 22 %
Lymphs Abs: 1.1 10*3/uL (ref 0.7–4.0)
MCH: 27.2 pg (ref 26.0–34.0)
MCHC: 30.6 g/dL (ref 30.0–36.0)
MCV: 88.7 fL (ref 78.0–100.0)
MONOS PCT: 10 %
Monocytes Absolute: 0.5 10*3/uL (ref 0.1–1.0)
NEUTROS PCT: 61 %
Neutro Abs: 3.1 10*3/uL (ref 1.7–7.7)
Platelets: 166 10*3/uL (ref 150–400)
RBC: 3.72 MIL/uL — AB (ref 3.87–5.11)
RDW: 15.2 % (ref 11.5–15.5)
WBC: 5 10*3/uL (ref 4.0–10.5)

## 2017-01-02 LAB — GLUCOSE, CAPILLARY
GLUCOSE-CAPILLARY: 125 mg/dL — AB (ref 65–99)
GLUCOSE-CAPILLARY: 124 mg/dL — AB (ref 65–99)
GLUCOSE-CAPILLARY: 170 mg/dL — AB (ref 65–99)
GLUCOSE-CAPILLARY: 201 mg/dL — AB (ref 65–99)
Glucose-Capillary: 182 mg/dL — ABNORMAL HIGH (ref 65–99)

## 2017-01-02 LAB — CULTURE, BLOOD (ROUTINE X 2)
Culture: NO GROWTH
Culture: NO GROWTH

## 2017-01-02 LAB — BASIC METABOLIC PANEL
ANION GAP: 10 (ref 5–15)
BUN: 23 mg/dL — ABNORMAL HIGH (ref 6–20)
CHLORIDE: 97 mmol/L — AB (ref 101–111)
CO2: 33 mmol/L — AB (ref 22–32)
Calcium: 9 mg/dL (ref 8.9–10.3)
Creatinine, Ser: 1.25 mg/dL — ABNORMAL HIGH (ref 0.44–1.00)
GFR calc non Af Amer: 42 mL/min — ABNORMAL LOW (ref >60)
GFR, EST AFRICAN AMERICAN: 49 mL/min — AB (ref >60)
Glucose, Bld: 138 mg/dL — ABNORMAL HIGH (ref 65–99)
POTASSIUM: 3.5 mmol/L (ref 3.5–5.1)
SODIUM: 140 mmol/L (ref 135–145)

## 2017-01-02 LAB — MAGNESIUM: MAGNESIUM: 1.5 mg/dL — AB (ref 1.7–2.4)

## 2017-01-02 LAB — PHOSPHORUS: PHOSPHORUS: 3.2 mg/dL (ref 2.5–4.6)

## 2017-01-02 MED ORDER — DOXYCYCLINE HYCLATE 100 MG PO TABS
100.0000 mg | ORAL_TABLET | Freq: Two times a day (BID) | ORAL | Status: DC
  Administered 2017-01-02 – 2017-01-03 (×2): 100 mg via ORAL
  Filled 2017-01-02 (×2): qty 1

## 2017-01-02 MED ORDER — ONDANSETRON HCL 4 MG/2ML IJ SOLN
4.0000 mg | Freq: Four times a day (QID) | INTRAMUSCULAR | Status: DC | PRN
  Administered 2017-01-02: 4 mg via INTRAVENOUS
  Filled 2017-01-02: qty 2

## 2017-01-02 MED ORDER — ENOXAPARIN SODIUM 60 MG/0.6ML ~~LOC~~ SOLN
60.0000 mg | Freq: Every day | SUBCUTANEOUS | Status: DC
  Administered 2017-01-03: 12:00:00 60 mg via SUBCUTANEOUS
  Filled 2017-01-02: qty 0.6

## 2017-01-02 MED ORDER — POTASSIUM CHLORIDE CRYS ER 20 MEQ PO TBCR
40.0000 meq | EXTENDED_RELEASE_TABLET | Freq: Two times a day (BID) | ORAL | Status: AC
  Administered 2017-01-02 (×2): 40 meq via ORAL
  Filled 2017-01-02 (×2): qty 2

## 2017-01-02 NOTE — Progress Notes (Signed)
Nutrition Follow-up  DOCUMENTATION CODES:   Morbid obesity  INTERVENTION:    Continue Pro-stat 30 ml BID with meals, each supplement provides 100 kcal and 15 gm protein.   Continue Juven 1 packet PO BID, contains Arginine, Glutamine, HMB, vitamins (C, E, B12, Zinc), and protein to promote wound healing.   NUTRITION DIAGNOSIS:   Increased nutrient needs related to wound healing as evidenced by estimated needs.  Progressing  GOAL:   Patient will meet greater than or equal to 90% of their needs  Progressing  MONITOR:   PO intake, Supplement acceptance, Labs, Skin  REASON FOR ASSESSMENT:   Malnutrition Screening Tool, Low Braden    ASSESSMENT:   71 yo obese female with PMH of R BKA, OSA/OHV (on CPAP) who was admitted on 3/17 with sepsis syndrome and mild encephalopathy. Developed hypotension and respiratory distress requiring BiPAP on 3/18; required transfer to the ICU.  3/20- transferred from ICU to medical floor  Pt sitting in recliner chair, playing on her tablet at times of visit. She states her appetite comes and goes. She estimates she consumed about 50% of her dinner. She has not yet eaten breakfast; RD assisted pt with tray set up and opening milk and cereal containers. Pt had consumed cup of Juven at time of visit. She confirms consuming both Juven and Prostat supplements as prescribed; expressed she is very motivated to assist with wound healing.   Encouraged pt to continue to consume meals and supplement to assist with wound healing.   Labs reviewed: CBGS: 124-182.   Diet Order:  Diet Carb Modified Fluid consistency: Thin; Room service appropriate? Yes  Skin:  Wound (see comment) (venous stasis ulcers to L leg)  Last BM:  01/01/17  Height:   Ht Readings from Last 1 Encounters:  12/29/16 5' (1.524 m)    Weight:   Wt Readings from Last 1 Encounters:  12/29/16 297 lb 2.9 oz (134.8 kg)    Ideal Body Weight:  42.5 kg  BMI:  Body mass index is 58.04  kg/m.  Estimated Nutritional Needs:   Kcal:  1800-2000  Protein:  120-140 gm  Fluid:  2 L  EDUCATION NEEDS:   Education needs addressed  Marnita Poirier A. Jimmye Norman, RD, LDN, CDE Pager: [phone] After hours Pager: [phone]

## 2017-01-02 NOTE — Progress Notes (Signed)
Orthopedic Tech Progress Note Patient Details:  Heather Zimmerman. Heather Zimmerman 1945/12/01 [id number]  Ortho Devices Type of Ortho Device: Ulna gutter splint Ortho Device/Splint Location: LLE Ortho Device/Splint Interventions: Ordered, Application   Braulio Bosch 01/02/2017, 5:25 PM

## 2017-01-02 NOTE — Evaluation (Signed)
Occupational Therapy Evaluation Patient Details Name: Heather Zimmerman. [place] MRN: [id number] DOB: [birth year] Today's Date: 01/02/2017    History of Present Illness 71 y.o. female presenting with altered mental status found to have hypercarbic respiratory failure and meeting sepsis criteria . PMH is significant for HFpEF, OSA on CPAP, chronic respiratory failure on 2L O2 at home, chronic left lower extremity wounds, s/p right BKA, T2DM, depression, and CKD.   Clinical Impression   Pt reports staff at SNF used a "standing lift" for OOB to w/c; she was able to assist with UB bathing/dressing sitting EOB and staff assisted with LB ADL PTA. Currently pt total assist +3 for A-P transfer from bed to chair. Pt required max assist to maintain sitting balance and needs max-total assist for ADL at bed level. Recommending return to SNF upon d/c. Pt would benefit from continued skilled OT to return to baseline level of participation in ADL.    Follow Up Recommendations  SNF;Supervision/Assistance - 24 hour    Equipment Recommendations  None recommended by OT    Recommendations for Other Services       Precautions / Restrictions Precautions Precautions: Fall Restrictions Weight Bearing Restrictions: No      Mobility Bed Mobility Overal bed mobility: Needs Assistance Bed Mobility: Rolling;Sidelying to Sit Rolling: Max assist;+2 for physical assistance Sidelying to sit: Max assist;+2 for physical assistance       General bed mobility comments: HOB flat with use of bed rails. Pt able to assist with bil UEs and LLE.  Transfers Overall transfer level: Needs assistance   Transfers: Comptroller transfers: Total assist;+2 physical assistance   General transfer comment: A-P transfer performed, pt able to assist minimally with LLE and bil UEs but required total assist overall with use of blanket to scoot posteriorly.    Balance Overall balance assessment:  Needs assistance Sitting-balance support: Bilateral upper extremity supported Sitting balance-Leahy Scale: Zero Sitting balance - Comments: Max assist to maintain sitting balance Postural control: Posterior lean                                  ADL Overall ADL's : Needs assistance/impaired Eating/Feeding: Set up;Sitting Eating/Feeding Details (indicate cue type and reason): in supported sitting Grooming: Moderate assistance;Sitting   Upper Body Bathing: Maximal assistance;Sitting   Lower Body Bathing: Total assistance;Bed level   Upper Body Dressing : Maximal assistance;Sitting   Lower Body Dressing: Total assistance;Bed level               Functional mobility during ADLs: Total assistance;+2 for physical assistance (for A-P transfer)       Vision         Perception     Praxis      Pertinent Vitals/Pain Pain Assessment: Faces Faces Pain Scale: Hurts a little bit Pain Location: back Pain Intervention(s): Monitored during session;Repositioned     Hand Dominance     Extremity/Trunk Assessment Upper Extremity Assessment Upper Extremity Assessment: Generalized weakness   Lower Extremity Assessment Lower Extremity Assessment: Defer to PT evaluation   Cervical / Trunk Assessment Cervical / Trunk Assessment: Other exceptions Cervical / Trunk Exceptions: body habitus   Communication Communication Communication: Other (comment) (difficult to understand at times)   Cognition Arousal/Alertness: Awake/alert Behavior During Therapy: Flat affect Overall Cognitive Status: Within Functional Limits for tasks assessed  General Comments       Exercises       Shoulder Instructions      Home Living Family/patient expects to be discharged to:: Skilled nursing facility                                 Additional Comments: long term resident of [facility]      Prior Functioning/Environment Level of  Independence: Needs assistance  Gait / Transfers Assistance Needed: Pt reports staff at SNF use a "standing lift" for transfers OOB to w/c. She occasionally propells herself in her w/c and sometimes staff assist. ADL's / Homemaking Assistance Needed: Pt reports she could do her UBB/D sitting EOB and that staff mainly helped her with LBB/D            OT Problem List: Decreased strength;Decreased range of motion;Decreased activity tolerance;Impaired balance (sitting and/or standing);Obesity;Impaired UE functional use;Increased edema      OT Treatment/Interventions: Self-care/ADL training;Therapeutic exercise;Energy conservation;DME and/or AE instruction;Therapeutic activities;Patient/family education;Balance training    OT Goals(Current goals can be found in the care plan section) Acute Rehab OT Goals Patient Stated Goal: none stated OT Goal Formulation: With patient Time For Goal Achievement: 01/16/17 Potential to Achieve Goals: Good ADL Goals Pt Will Perform Grooming: with min guard assist;sitting Pt Will Perform Upper Body Bathing: with min guard assist;sitting Pt Will Perform Upper Body Dressing: with min guard assist;sitting  OT Frequency: Min 1X/week   Barriers to D/C:            Co-evaluation PT/OT/SLP Co-Evaluation/Treatment: Yes Reason for Co-Treatment: For patient/therapist safety   OT goals addressed during session: Other (comment) (functional mobility)      End of Session Equipment Utilized During Treatment: Oxygen Nurse Communication: Mobility status;Need for lift equipment  Activity Tolerance: Patient tolerated treatment well Patient left: in chair;with call bell/phone within reach  OT Visit Diagnosis: Muscle weakness (generalized) (M62.81)                ADL either performed or assessed with clinical judgement  Time: 1010-1039 OT Time Calculation (min): 29 min Charges:  OT General Charges $OT Visit: 1 Procedure OT Evaluation $OT Eval Moderate  Complexity: 1 Procedure G-Codes:     Severo Beber A. Ulice Brilliant, M.S., OTR/L Pager: [place] 01/02/2017, 12:01 PM

## 2017-01-02 NOTE — Progress Notes (Signed)
Family Medicine Teaching Service Daily Progress Note Intern Pager: [phone]  Patient name: Heather Zimmerman. [place] record number: [id number] Date of birth: [birth year] Age: 71 y.o. Gender: female  Primary Care Provider: Gildardo Cranker, DO Consultants: CCM Code Status: FULL   Pt Overview and Major Events to Date:  Admit 3/17 FPTS>ICU 3/18 ICU>FPTS 3/21 Vanc/Zosyn (3/17>3/22) Doxy (3/22>)  Assessment and Plan: Heather Zimmerman is a 71 y.o. female presenting with altered mental status found to have hypercarbic respiratory failure and meeting sepsis criteria . PMH is significant for HFpEF, OSA on CPAP, chronic respiratory failure on 2L O2 at home, chronic left lower extremity wounds, s/p right BKA, T2DM, depression, and CKD.   Sepsis, improving: Suspected source: LLE cellulitis.  On arrivral, meeting SIRS criteria with fever (Tmax 101.3), tachycardia, tachypnea and leukocytosis to 18. Lactic acidosis 2.41 > 1.94. Was started on Vanc/Zosyn.   White count improved to 5.0 and patient has remained afebrile.  With chronic skin changes. No draining wounds or ulcers present on admission.  Negative left LE X-rays from 1/30 for osteomyelitis.  - Is now on her home 2L O2.  -LLE cellulitis with induration and tenderness, no fluctuance  -On broad spectrum abx.  Day #7 Vanc/zosyn. - CT of left LE ordered >> soft tissue edema in subcut fat of L thigh and most concerning for cellulitis (most severe along distal medial aspect), no drainable fluid collection to suggest an abscess  - Will de-escalate to Doxycyline today (3/22>) and monitor for improvement.  If worsening, will broaden antibiotics again.   -blood and urine cultures both NG to date.   -wound care consult   Acute Encephalopathy, resolved.  -Normal Ammonia, B12, ethanol and negative head CT.  AMS most likely secondary to hypotension/sepsis/hypercarbia.   -Continue to hold home Gabapentin and Cymbalta.   Acute on Chronic Hypercarbic/Hypoxic  Respiratory Failure with known OSA/OHS, improved:  -Patient back on her home 2L O2 per [place] with O2 sats 100% -Duonebs q4h prn/albuterol q2h prn  -continue Dulera 2 puffs BID (substitute for home Advair).  Will likely transition to Advair at time of discharge.    AoCKD: Patient with history of CKD (bl difficult to access but appears to be between 1.0-1.4). Cr at presentation 1.6. Suspect pre-renal etiology from presumed poor PO intake as patient has apparently been altered for >24h prior to presentation.  -KVO IVFs   HFpEF: Most recent echo (Oct 2017) with LVEF 60-65%, G1DD, and normal pulmonary artery pressure. Patient takes Torsemide 30 mg daily. CXR with trace bilateral pleural effusions and mild pulmonary edema. -Continue home Torsemide 30 mg daily   T2DM: Takes Lantus 26u nightly and Novolog SSI at home. CBGs 159, 166, 124.  -Continue mod SSI  -hold gabapentin given acute encephalopathy   OSA:  -CPAP nightly  -Patient is noncompliant with Cpap at home but will stress this to her.   Depression: -holding Cymbalta due to AMS   Morbid Obesity:  -PT/OT consult  -SW consult as anticipate return to SNF   FEN/GI: Carb mod diet, prostat and juven supplement,  KVO  Prophylaxis: Lovenox   Disposition: Pending clinical improvement of cellulitis.   Subjective:  Patient laying in hospital bed, appears comfortable.  Reports her left leg is itchy but no other concerns at this time.    Objective: Temp:  [98.7 F (37.1 C)] 98.7 F (37.1 C) (03/22 0403) Pulse Rate:  [64-66] 64 (03/22 0403) Resp:  [18] 18 (03/22 0403) BP: (136-141)/(43-64) 136/43 (03/22 0403) SpO2:  [100 %]  100 % (03/22 0403)   Physical Exam: General: morbidly obese 71 yo F laying in hospital bed, in NAD, [place] in place Cardiovascular: RRR no MRG Respiratory: CTA B/L, no increased work of breathing, no use of accessory muscles Abdomen: soft, NT, ND, +bs MSK: Right BKA  Extremities: Left leg with chronic venous  changes, left inner thigh with area of tenderness and induration, slightly warm.  Skin: warm, dry Neuro: alert, oriented  Laboratory:  Recent Labs Lab 12/31/16 0213 01/01/17 0948 01/02/17 0350  WBC 7.2 4.9 5.0  HGB 9.8* 9.9* 10.1*  HCT 33.2* 32.4* 33.0*  PLT 141* 155 166    Recent Labs Lab 12/28/16 1621  12/31/16 0213 01/01/17 0948 01/02/17 0350  NA 141  < > 145 142 140  K 4.8  < > 3.7 3.5 3.5  CL 98*  < > 109 106 97*  CO2 32  < > 29 26 33*  BUN 33*  < > 22* 17 23*  CREATININE 1.51*  < > 1.38* 1.21* 1.25*  CALCIUM 9.5  < > 8.5* 8.8* 9.0  PROT 8.7*  --   --   --   --   BILITOT 1.2  --   --   --   --   ALKPHOS 167*  --   --   --   --   ALT 22  --   --   --   --   AST 34  --   --   --   --   GLUCOSE 183*  < > 108* 162* 138*  < > = values in this interval not displayed.  Imaging/Diagnostic Tests: Ct Femur Left W Contrast  Result Date: 01/02/2017 CLINICAL DATA:  Lower extremity cellulitis. No draining wounds or ulcers. EXAM: CT OF THE LOWER RIGHT EXTREMITY WITH CONTRAST TECHNIQUE: Multidetector CT imaging of the lower right extremity was performed according to the standard protocol following intravenous contrast administration. COMPARISON:  None. CONTRAST:  19mL ISOVUE-300 IOPAMIDOL (ISOVUE-300) INJECTION 61% FINDINGS: Bones/Joint/Cartilage No fracture or dislocation. Normal alignment. No joint effusion. Mild osteoarthritis of the right hip. Tricompartmental osteoarthritis of the left knee most severe in the lateral patellofemoral compartment. No lytic or sclerotic osseous lesion. No periosteal reaction or bone destruction. Ligaments Ligaments are suboptimally evaluated by CT. Muscles and Tendons Muscles are normal.  No intramuscular fluid collection or hematoma. Soft tissue No fluid collection or hematoma.  No soft tissue mass. Soft tissue edema within the subcutaneous fat of the left thigh most severe along the distal aspect and medially. Soft tissue edema within the  subcutaneous fat along the anterolateral aspect of the left lower leg with calcifications within the subcutaneous fat. IMPRESSION: 1. Soft tissue edema in the subcutaneous fat of the left thigh and most concerning for cellulitis which is most severe along the distal medial aspect. 2. Soft tissue edema along the anterolateral aspect of the left lower leg with calcifications within the subcutaneous fat. This is likely related to venous stasis with possible superimposed cellulitis. 3. No drainable fluid collection to suggest an abscess. Electronically Signed   By: Kathreen Devoid   On: 01/02/2017 08:51    Lovenia Kim, MD 01/02/2017, 2:17 PM PGY-1, [place] Intern pager: [phone], text pages welcome

## 2017-01-02 NOTE — Evaluation (Signed)
Physical Therapy Evaluation Patient Details Name: Heather Zimmerman. [place] MRN: [id number] DOB: May 02, 1946 Today's Date: 01/02/2017   History of Present Illness  71 y.o. female presenting with altered mental status found to have hypercarbic respiratory failure and meeting sepsis criteria . PMH is significant for HFpEF, OSA on CPAP, chronic respiratory failure on 2L O2 at home, chronic left lower extremity wounds, s/p right BKA, T2DM, depression, and CKD.  Clinical Impression  Pt's premorbid status limited to mechanical lift transfers.  She demo difficulty with sitting balance although she utilizes her limbs to her best ability.  Recommend nursing use mechanical lift for all transfers for injury prevention.  Will continue to follow patient while on this venue of care to progress mobility    Follow Up Recommendations SNF;Supervision/Assistance - 24 hour    Equipment Recommendations  None recommended by PT    Recommendations for Other Services       Precautions / Restrictions Precautions Precautions: Fall Precaution Comments: does not have prosthesis Rt leg, wearing protective soft boot left lower leg Restrictions Weight Bearing Restrictions: No      Mobility  Bed Mobility Overal bed mobility: Needs Assistance Bed Mobility: Rolling;Sidelying to Sit Rolling: Max assist;+2 for physical assistance Sidelying to sit: Max assist;+2 for physical assistance       General bed mobility comments: use of bed rails. Pt able to assist with reaching with bil UEs and LLE.  Transfers Overall transfer level: Needs assistance Equipment used: None Transfers: Comptroller transfers: Total assist;+2 physical assistance   General transfer comment: AP transfer from bed to bariatric chair, able to support self using UEs, attempted sheet transfer to assist while scooting  Ambulation/Gait                Stairs            Wheelchair Mobility     Modified Rankin (Stroke Patients Only)       Balance Overall balance assessment: Needs assistance Sitting-balance support: Bilateral upper extremity supported;Feet unsupported Sitting balance-Leahy Scale: Zero Sitting balance - Comments: total assist to maintain sitting balance while holdint to rail EOB, unable to advance to EOB to support leg on floor Postural control: Posterior lean                                   Pertinent Vitals/Pain Pain Assessment: Faces Pain Score: 2  Faces Pain Scale: Hurts a little bit Pain Location: back Pain Descriptors / Indicators: Aching;Sore;Other (Comment) (itchcing) Pain Intervention(s): Limited activity within patient's tolerance;Monitored during session;Repositioned    Home Living Family/patient expects to be discharged to:: Skilled nursing facility                 Additional Comments: Resident of [facility]    Prior Function Level of Independence: Needs assistance   Gait / Transfers Assistance Needed: pt reports use of mechanical standing lift for transfers           Hand Dominance   Dominant Hand: Left    Extremity/Trunk Assessment   Upper Extremity Assessment Upper Extremity Assessment: Defer to OT evaluation    Lower Extremity Assessment Lower Extremity Assessment: Generalized weakness (good functional use left leg, minimal active use Rt leg)    Cervical / Trunk Assessment Cervical / Trunk Assessment: Kyphotic;Other exceptions (difficulty getting over base of support secondary to girth)  Communication   Communication: Expressive difficulties (tends  to mumble, decr volume)  Cognition Arousal/Alertness: Awake/alert Behavior During Therapy: Flat affect Overall Cognitive Status: Within Functional Limits for tasks assessed                      General Comments General comments (skin integrity, edema, etc.): soft boot on wrapped left lower leg    Exercises     Assessment/Plan    PT  Assessment Patient needs continued PT services  PT Problem List Decreased strength;Decreased range of motion;Decreased activity tolerance;Decreased balance;Decreased mobility;Cardiopulmonary status limiting activity;Decreased skin integrity;Pain       PT Treatment Interventions Functional mobility training;Therapeutic activities;Therapeutic exercise;Balance training;Neuromuscular re-education;Patient/family education;Manual techniques    PT Goals (Current goals can be found in the Care Plan section)  Acute Rehab PT Goals Patient Stated Goal: scratch my back PT Goal Formulation: With patient Time For Goal Achievement: 01/11/17 Potential to Achieve Goals: Fair    Frequency Min 2X/week   Barriers to discharge        Co-evaluation PT/OT/SLP Co-Evaluation/Treatment: Yes Reason for Co-Treatment: Complexity of the patient's impairments (multi-system involvement);For patient/therapist safety;To address functional/ADL transfers PT goals addressed during session: Mobility/safety with mobility;Balance         End of Session   Activity Tolerance: Patient limited by fatigue;Patient limited by pain Patient left: in chair;with call bell/phone within reach;with nursing/sitter in room (lift pad under pt) Nurse Communication: Mobility status;Need for lift equipment PT Visit Diagnosis: Muscle weakness (generalized) (M62.81)         Time: 4497-5300 PT Time Calculation (min) (ACUTE ONLY): 30 min   Charges:   PT Evaluation $PT Eval High Complexity: 1 Procedure PT Treatments $Therapeutic Activity: 8-22 mins   PT G CodesSuszanne Finch, PT [phone]  [place] 01/02/2017, 5:11 PM

## 2017-01-03 DIAGNOSIS — E785 Hyperlipidemia, unspecified: Secondary | ICD-10-CM | POA: Diagnosis not present

## 2017-01-03 DIAGNOSIS — J9621 Acute and chronic respiratory failure with hypoxia: Secondary | ICD-10-CM | POA: Diagnosis not present

## 2017-01-03 DIAGNOSIS — N390 Urinary tract infection, site not specified: Secondary | ICD-10-CM | POA: Diagnosis not present

## 2017-01-03 DIAGNOSIS — E119 Type 2 diabetes mellitus without complications: Secondary | ICD-10-CM | POA: Diagnosis not present

## 2017-01-03 DIAGNOSIS — R6520 Severe sepsis without septic shock: Secondary | ICD-10-CM | POA: Diagnosis not present

## 2017-01-03 DIAGNOSIS — R498 Other voice and resonance disorders: Secondary | ICD-10-CM | POA: Diagnosis not present

## 2017-01-03 DIAGNOSIS — I130 Hypertensive heart and chronic kidney disease with heart failure and stage 1 through stage 4 chronic kidney disease, or unspecified chronic kidney disease: Secondary | ICD-10-CM | POA: Diagnosis not present

## 2017-01-03 DIAGNOSIS — J9612 Chronic respiratory failure with hypercapnia: Secondary | ICD-10-CM | POA: Diagnosis not present

## 2017-01-03 DIAGNOSIS — L03116 Cellulitis of left lower limb: Secondary | ICD-10-CM | POA: Diagnosis not present

## 2017-01-03 DIAGNOSIS — G4733 Obstructive sleep apnea (adult) (pediatric): Secondary | ICD-10-CM | POA: Diagnosis not present

## 2017-01-03 DIAGNOSIS — I83028 Varicose veins of left lower extremity with ulcer other part of lower leg: Secondary | ICD-10-CM | POA: Diagnosis not present

## 2017-01-03 DIAGNOSIS — N183 Chronic kidney disease, stage 3 (moderate): Secondary | ICD-10-CM | POA: Diagnosis not present

## 2017-01-03 DIAGNOSIS — I509 Heart failure, unspecified: Secondary | ICD-10-CM | POA: Diagnosis not present

## 2017-01-03 DIAGNOSIS — L03115 Cellulitis of right lower limb: Secondary | ICD-10-CM | POA: Diagnosis not present

## 2017-01-03 DIAGNOSIS — I83022 Varicose veins of left lower extremity with ulcer of calf: Secondary | ICD-10-CM | POA: Diagnosis not present

## 2017-01-03 DIAGNOSIS — M6281 Muscle weakness (generalized): Secondary | ICD-10-CM | POA: Diagnosis not present

## 2017-01-03 DIAGNOSIS — I87312 Chronic venous hypertension (idiopathic) with ulcer of left lower extremity: Secondary | ICD-10-CM | POA: Diagnosis not present

## 2017-01-03 DIAGNOSIS — G894 Chronic pain syndrome: Secondary | ICD-10-CM | POA: Diagnosis not present

## 2017-01-03 DIAGNOSIS — G8929 Other chronic pain: Secondary | ICD-10-CM | POA: Diagnosis not present

## 2017-01-03 DIAGNOSIS — K219 Gastro-esophageal reflux disease without esophagitis: Secondary | ICD-10-CM | POA: Diagnosis not present

## 2017-01-03 DIAGNOSIS — E109 Type 1 diabetes mellitus without complications: Secondary | ICD-10-CM | POA: Diagnosis not present

## 2017-01-03 DIAGNOSIS — J9620 Acute and chronic respiratory failure, unspecified whether with hypoxia or hypercapnia: Secondary | ICD-10-CM | POA: Diagnosis not present

## 2017-01-03 DIAGNOSIS — L0390 Cellulitis, unspecified: Secondary | ICD-10-CM | POA: Diagnosis not present

## 2017-01-03 DIAGNOSIS — J9622 Acute and chronic respiratory failure with hypercapnia: Secondary | ICD-10-CM | POA: Diagnosis not present

## 2017-01-03 DIAGNOSIS — Z89511 Acquired absence of right leg below knee: Secondary | ICD-10-CM | POA: Diagnosis not present

## 2017-01-03 DIAGNOSIS — J9602 Acute respiratory failure with hypercapnia: Secondary | ICD-10-CM | POA: Diagnosis not present

## 2017-01-03 DIAGNOSIS — I5032 Chronic diastolic (congestive) heart failure: Secondary | ICD-10-CM | POA: Diagnosis not present

## 2017-01-03 DIAGNOSIS — E1140 Type 2 diabetes mellitus with diabetic neuropathy, unspecified: Secondary | ICD-10-CM | POA: Diagnosis not present

## 2017-01-03 DIAGNOSIS — I10 Essential (primary) hypertension: Secondary | ICD-10-CM | POA: Diagnosis not present

## 2017-01-03 DIAGNOSIS — Z794 Long term (current) use of insulin: Secondary | ICD-10-CM | POA: Diagnosis not present

## 2017-01-03 DIAGNOSIS — R4781 Slurred speech: Secondary | ICD-10-CM | POA: Diagnosis not present

## 2017-01-03 DIAGNOSIS — I7389 Other specified peripheral vascular diseases: Secondary | ICD-10-CM | POA: Diagnosis not present

## 2017-01-03 DIAGNOSIS — E1143 Type 2 diabetes mellitus with diabetic autonomic (poly)neuropathy: Secondary | ICD-10-CM | POA: Diagnosis not present

## 2017-01-03 DIAGNOSIS — F329 Major depressive disorder, single episode, unspecified: Secondary | ICD-10-CM | POA: Diagnosis not present

## 2017-01-03 DIAGNOSIS — S81802D Unspecified open wound, left lower leg, subsequent encounter: Secondary | ICD-10-CM | POA: Diagnosis not present

## 2017-01-03 DIAGNOSIS — I83023 Varicose veins of left lower extremity with ulcer of ankle: Secondary | ICD-10-CM | POA: Diagnosis not present

## 2017-01-03 DIAGNOSIS — D631 Anemia in chronic kidney disease: Secondary | ICD-10-CM | POA: Diagnosis not present

## 2017-01-03 DIAGNOSIS — E1165 Type 2 diabetes mellitus with hyperglycemia: Secondary | ICD-10-CM | POA: Diagnosis not present

## 2017-01-03 DIAGNOSIS — N179 Acute kidney failure, unspecified: Secondary | ICD-10-CM | POA: Diagnosis not present

## 2017-01-03 DIAGNOSIS — G9340 Encephalopathy, unspecified: Secondary | ICD-10-CM | POA: Diagnosis not present

## 2017-01-03 DIAGNOSIS — A419 Sepsis, unspecified organism: Secondary | ICD-10-CM | POA: Diagnosis not present

## 2017-01-03 DIAGNOSIS — E1169 Type 2 diabetes mellitus with other specified complication: Secondary | ICD-10-CM | POA: Diagnosis not present

## 2017-01-03 DIAGNOSIS — Z0000 Encounter for general adult medical examination without abnormal findings: Secondary | ICD-10-CM | POA: Diagnosis not present

## 2017-01-03 DIAGNOSIS — J80 Acute respiratory distress syndrome: Secondary | ICD-10-CM | POA: Diagnosis not present

## 2017-01-03 DIAGNOSIS — M25511 Pain in right shoulder: Secondary | ICD-10-CM | POA: Diagnosis not present

## 2017-01-03 LAB — CBC
HEMATOCRIT: 33.2 % — AB (ref 36.0–46.0)
HEMOGLOBIN: 10.4 g/dL — AB (ref 12.0–15.0)
MCH: 27.6 pg (ref 26.0–34.0)
MCHC: 31.3 g/dL (ref 30.0–36.0)
MCV: 88.1 fL (ref 78.0–100.0)
Platelets: 188 10*3/uL (ref 150–400)
RBC: 3.77 MIL/uL — ABNORMAL LOW (ref 3.87–5.11)
RDW: 14.9 % (ref 11.5–15.5)
WBC: 6 10*3/uL (ref 4.0–10.5)

## 2017-01-03 LAB — BASIC METABOLIC PANEL
Anion gap: 11 (ref 5–15)
BUN: 26 mg/dL — AB (ref 6–20)
CALCIUM: 9.1 mg/dL (ref 8.9–10.3)
CO2: 30 mmol/L (ref 22–32)
Chloride: 96 mmol/L — ABNORMAL LOW (ref 101–111)
Creatinine, Ser: 1.22 mg/dL — ABNORMAL HIGH (ref 0.44–1.00)
GFR calc Af Amer: 50 mL/min — ABNORMAL LOW (ref >60)
GFR, EST NON AFRICAN AMERICAN: 43 mL/min — AB (ref >60)
GLUCOSE: 104 mg/dL — AB (ref 65–99)
Potassium: 3.9 mmol/L (ref 3.5–5.1)
SODIUM: 137 mmol/L (ref 135–145)

## 2017-01-03 LAB — GLUCOSE, CAPILLARY
Glucose-Capillary: 99 mg/dL (ref 65–99)
Glucose-Capillary: 108 mg/dL — ABNORMAL HIGH (ref 65–99)
Glucose-Capillary: 120 mg/dL — ABNORMAL HIGH (ref 65–99)
Glucose-Capillary: 274 mg/dL — ABNORMAL HIGH (ref 65–99)

## 2017-01-03 MED ORDER — NYSTATIN 100000 UNIT/GM EX POWD: Freq: Three times a day (TID) | CUTANEOUS | 0 refills | Status: DC

## 2017-01-03 MED ORDER — MAGNESIUM SULFATE 2 GM/50ML IV SOLN
2.0000 g | Freq: Once | INTRAVENOUS | Status: AC
  Administered 2017-01-03: 2 g via INTRAVENOUS
  Filled 2017-01-03: qty 50

## 2017-01-03 MED ORDER — DOXYCYCLINE HYCLATE 100 MG PO TABS: 100.0000 mg | ORAL_TABLET | Freq: Two times a day (BID) | ORAL | 0 refills | Status: DC

## 2017-01-03 NOTE — Progress Notes (Signed)
Patient will DC to: [facility] Anticipated DC date: 01/03/17 Family notified: N/A Transport by: Corey Harold   Per MD patient ready for DC to [facility]. RN, patient, patient's family, and facility notified of DC. Discharge Summary sent to facility. RN given number for report. DC packet on chart. Ambulance transport requested for patient.   CSW signing off.  Cedric Fishman, [place] Social Worker [phone]

## 2017-01-03 NOTE — Progress Notes (Signed)
Patient placed on CPAP for the night without complication. RT will continue to monitor as needed. 

## 2017-01-03 NOTE — Progress Notes (Signed)
Family Medicine Teaching Service Daily Progress Note Intern Pager: [phone]  Patient name: Heather Zimmerman. [place] record number: [id number] Date of birth: [birth year] Age: 71 y.o. Gender: female  Primary Care Provider: Gildardo Cranker, DO Consultants: CCM Code Status: FULL   Pt Overview and Major Events to Date:  Admit 3/17 FPTS>ICU 3/18 ICU>FPTS 3/21 Vanc/Zosyn (3/17>3/22) Doxy (3/22>)  Assessment and Plan: [name]. Amara is a 71 y.o. female presenting with altered mental status found to have hypercarbic respiratory failure and meeting sepsis criteria . PMH is significant for HFpEF, OSA on CPAP, chronic respiratory failure on 2L O2 at home, chronic left lower extremity wounds, s/p right BKA, T2DM, depression, and CKD.   Sepsis, improving: Suspected source: LLE cellulitis. White count improved to 5.0 and patient has remained afebrile.  With chronic skin changes. No draining wounds or ulcers present on admission.  Negative left LE X-rays from 1/30 for osteomyelitis. Blood and urine cultures NG and final. - CT of left LE ordered >> soft tissue edema in subcut fat of L thigh and most concerning for cellulitis (most severe along distal medial aspect), no drainable fluid collection to suggest an abscess  - continue Doxycyline today (3/22>) - wound care consult   Acute on Chronic Hypercarbic/Hypoxic Respiratory Failure with known OSA/OHS, improved:  -Patient back on her home 2L O2 per [place] with O2 sats 100% -Duonebs q4h prn/albuterol q2h prn  -continue Dulera 2 puffs BID (substitute for home Advair).   CKD: Patient with history of CKD (bl appears to be between 1.0-1.4). Cr at presentation 1.6. Suspect pre-renal etiology from presumed poor PO intake as patient has apparently been altered for >24h prior to presentation. Cr now within baseline. -monitor on BMP  HFpEF: Most recent echo (Oct 2017) with LVEF 60-65%, G1DD, and normal pulmonary artery pressure. Patient takes Torsemide 30 mg daily.  CXR with trace bilateral pleural effusions and mild pulmonary edema. -Continue home Torsemide 30 mg daily   T2DM: Takes Lantus 26u nightly and Novolog SSI at home. -Continue mod SSI  -hold gabapentin given acute encephalopathy   OSA:  -CPAP nightly   Depression: -holding Cymbalta due to AMS   Morbid Obesity:  -PT/OT consult: SNF -SW consult as anticipate return to SNF   FEN/GI: Carb mod diet, prostat and juven supplement,  KVO  Prophylaxis: Lovenox   Disposition: Pending clinical improvement of cellulitis.   Subjective:  Patient feels she needs to have a BM, concerned with diffuse lower abdominal pain.  Objective: Temp:  [97.3 F (36.3 C)-98.3 F (36.8 C)] 98.2 F (36.8 C) (03/23 0516) Pulse Rate:  [62-71] 62 (03/23 0516) Resp:  [17-18] 18 (03/23 0516) BP: (113-135)/(48-67) 135/66 (03/23 0516) SpO2:  [99 %] 99 % (03/23 0516)   Physical Exam: General: morbidly obese 71 yo F laying in hospital bed, in NAD, [place] in place Cardiovascular: RRR, no murmur appreciated Respiratory: CTAB (exam limited by body habitus) no increased work of breathing Abdomen: SNTND, +BS MSK: Right BKA  Extremities: Left leg with UNNA boot and offloading shoe, R stump without erythema or warmth Neuro: alert, oriented  Laboratory:  Recent Labs Lab 12/31/16 0213 01/01/17 0948 01/02/17 0350  WBC 7.2 4.9 5.0  HGB 9.8* 9.9* 10.1*  HCT 33.2* 32.4* 33.0*  PLT 141* 155 166    Recent Labs Lab 12/28/16 1621  01/01/17 0948 01/02/17 0350 01/03/17 0441  NA 141  < > 142 140 137  K 4.8  < > 3.5 3.5 3.9  CL 98*  < > 106  97* 96*  CO2 32  < > 26 33* 30  BUN 33*  < > 17 23* 26*  CREATININE 1.51*  < > 1.21* 1.25* 1.22*  CALCIUM 9.5  < > 8.8* 9.0 9.1  PROT 8.7*  --   --   --   --   BILITOT 1.2  --   --   --   --   ALKPHOS 167*  --   --   --   --   ALT 22  --   --   --   --   AST 34  --   --   --   --   GLUCOSE 183*  < > 162* 138* 104*  < > = values in this interval not  displayed.  Imaging/Diagnostic Tests: No results found.  Sela Hilding, MD 01/03/2017, 8:12 AM PGY-1, [place] Intern pager: [phone], text pages welcome

## 2017-01-03 NOTE — Discharge Summary (Signed)
[facility] Discharge Summary  Patient name: Heather Zimmerman. Heather Zimmerman record number: [id number] Date of birth: Feb 19, 1946 Age: 72 y.o. Gender: female Date of Admission: 12/28/2016  Date of Discharge: 01/03/17 Admitting Physician: Kinnie Feil, MD  Primary Care Provider: Gildardo Cranker, DO Consultants: CCM  Indication for Hospitalization: Sepsis 2/2 LLE cellulitis with AMS and hypercarbic respiratory failure  Discharge Diagnoses/Problem List:  Sepsis (resolved) LLE cellulitis Acute Encephalopathy (resolved) Acute on Chronic Hypercarbic Respiratory Failure (improved) Acute on Chronic Kidney Disease HFpEF T2DM OSA Depression Morbid Obesity  Disposition: SNF  Discharge Condition: Stable, improved  Discharge Exam:  General: morbidly obese 70 yo F laying in hospital bed, in NAD, Dermott in place Cardiovascular: RRR, no murmur appreciated Respiratory: CTAB (exam limited by body habitus) no increased work of breathing Abdomen: SNTND, +BS MSK: Right BKA  Extremities: Left leg with UNNA boot and offloading shoe, R stump without erythema or warmth Neuro: alert, oriented  Brief Hospital Course:  Araya is a 71 year old female presenting to the ED with AMS and found to have sepsis 2/2 LLE cellulitis and acute on chronic hypercarbic respiratory failure. She was meeting SIRS criteria with a fever to 101.3, tachycardia, tachypnea, and leukocytosis to 18. She was also hypotensive with MAPs 55-72. Qsofa score was 2. CXR with trace bilateral pleural effusions and mild pulmonary edema. Blood and urine cultures were drawn. She was treated empirically with Vancomycin and Zosyn. Hospital course is described by problem list below.  Septic Shock 2/2 LLE cellulitis: Pt initially received 1L NS bolus in the ED. Her BPs remained low with MAPs 55-72. She received an additional 1.5L NS bolus. Lactic acid trended up from 1.94 > 2.7 > 3.6 > 2.2. CCM was consulted and she received an  additional 1L NS bolus, with minimal improvement in her BPs. She was transferred to the ICU for pressor support with Neosynephrine. Pressors were weaned off on hospital day 3. Pt received 6 days of Vancomycin/Zosyn. Blood and urine cultures with no growth. CT left leg was performed to rule out abscess due to area of induration that was appreciated on exam. CT showed soft tissue edema suggestive of cellulitis without any signs of drainable fluid collection. She was transitioned to Doxycyline on 3/22 and continued to improve and remain afebrile. She was discharged home with a total 14 day course of antibiotics.  Acute on Chronic Hypercarbic Respiratory Failure: Pt with a history of OHS and OSA, requiring CPAP at night and 2L O2 chronically at home. VBG at presentation with pH 7.37, pCO2 68.5, pO2 51.0, bicarb 39.7. While in the ICU, she received BiPAP support. On hospital day 3, she was transitioned off BiPAP onto [place] and then weaned to her home 2L O2 without any issues.  AMS: Pt thought to be altered for ~24 hours prior to arrival in the ED. Likely secondary to sepsis and hypercarbic respiratory failure. CT head negative. AMS labs were normal, including ammonia, magnesium, B12, ethanol, HIV, folate, RPR, troponins. Her home Gabapentin and Cymbalta were initially held, but were resumed on discharge because her AMS resolved.   Issues for Follow Up:  1. Unna boot placed over LLE during hospitalization. Recommend continued wound care to LLE. 2. Pt with area of induration on the left thigh that improved throughout the course of her hospitalization. CT leg with no drainable abscess. Please follow-up with this and make sure it resolves.  Significant Procedures: None  Significant Labs and Imaging:   Recent Labs Lab 01/01/17 0948  01/02/17 0350 01/03/17 0917  WBC 4.9 5.0 6.0  HGB 9.9* 10.1* 10.4*  HCT 32.4* 33.0* 33.2*  PLT 155 166 188    Recent Labs Lab 12/28/16 1621  12/29/16 0101  12/30/16 0059  12/30/16 0646 12/31/16 0213 01/01/17 0948 01/02/17 0350 01/03/17 0441  NA 141  < >  --   < > 144  --  145 142 140 137  K 4.8  < >  --   < > 3.9  --  3.7 3.5 3.5 3.9  CL 98*  < >  --   < > 111  --  109 106 97* 96*  CO2 32  --   --   < > 26  --  29 26 33* 30  GLUCOSE 183*  < >  --   < > 135*  --  108* 162* 138* 104*  BUN 33*  < >  --   < > 30*  --  22* 17 23* 26*  CREATININE 1.51*  < >  --   < > 1.73*  --  1.38* 1.21* 1.25* 1.22*  CALCIUM 9.5  --   --   < > 8.0*  --  8.5* 8.8* 9.0 9.1  MG  --   --  1.9  --   --  2.1 2.2 1.6* 1.5*  --   PHOS  --   --   --   --   --  2.9 2.0* 2.7 3.2  --   ALKPHOS 167*  --   --   --   --   --   --   --   --   --   AST 34  --   --   --   --   --   --   --   --   --   ALT 22  --   --   --   --   --   --   --   --   --   ALBUMIN 3.1*  --   --   --   --   --   --   --   --   --   < > = values in this interval not displayed.  VBG- pH 7.371, pCO2 68.5, pO2 51.0 UA- trace leukocytes, negative nitrites UDS- negative Lactic acid- 2.41 > 1.94 > 2.7 > 1.6 > 2.4 > 2.6 > 3.6 > 2.2 Urine culture- no growth Blood culture- no growth B12- 597 BNP 28.7 Folate 1330 RPR neg HIV neg Trop 0.05 Ammonia 28 Ethanol <5  CT head- negative  Results/Tests Pending at Time of Discharge: None  Discharge Medications:  Allergies as of 01/03/2017      Reactions   Ace Inhibitors Other (See Comments)   unknown      Medication List    STOP taking these medications   magnesium oxide 400 MG tablet Commonly known as:  MAG-OX   OCUSOFT EYELID CLEANSING Pads     TAKE these medications   acetaminophen 650 MG CR tablet Commonly known as:  TYLENOL Take 650 mg by mouth every 6 (six) hours as needed for pain.   antiseptic oral rinse Liqd 15 mLs by Mouth Rinse route every hour as needed for dry mouth.   aspirin 81 MG tablet Take 81 mg by mouth daily.   camphor-menthol lotion Commonly known as:  SARNA Apply 1 application topically every 8 (eight) hours as needed for  itching.   cholecalciferol 1000 units tablet  Commonly known as:  VITAMIN D Take 2,000 Units by mouth daily.   doxycycline 100 MG tablet Commonly known as:  VIBRA-TABS Take 1 tablet (100 mg total) by mouth every 12 (twelve) hours.   DULoxetine 20 MG capsule Commonly known as:  CYMBALTA Take 40 mg by mouth daily.   feeding supplement (PRO-STAT SUGAR FREE 64) Liqd Take 30 mLs by mouth 3 (three) times daily with meals. For wound healing and skin integrity   fluticasone 50 MCG/ACT nasal spray Commonly known as:  FLONASE Place 2 sprays into both nostrils at bedtime.   Fluticasone-Salmeterol 250-50 MCG/DOSE Aepb Commonly known as:  ADVAIR Inhale 1 puff into the lungs every 12 (twelve) hours. Reported on 04/25/2016   gabapentin 600 MG tablet Commonly known as:  NEURONTIN Take 600 mg by mouth 2 (two) times daily.   GLUCAGON EMERGENCY 1 MG injection Generic drug:  glucagon Inject 1 mg into the vein once as needed (blood sugar).   insulin aspart protamine- aspart (70-30) 100 UNIT/ML injection Commonly known as:  NOVOLOG MIX 70/30 Inject 2-15 Units into the skin See admin instructions. Inject as per sliding scale: 121-150=2u, 151-200=3u, 201-250=5u, 251-300=8u, 301-350=11u, 351-400=15u before meals and at bedtime   Insulin Glargine 100 UNIT/ML Solostar Pen Commonly known as:  LANTUS SOLOSTAR Inject 26 Units into the skin daily at 10 pm.   ipratropium-albuterol 0.5-2.5 (3) MG/3ML Soln Commonly known as:  DUONEB Take 3 mLs by nebulization every 6 (six) hours as needed (shortness of breath).   MILK OF MAGNESIA 400 MG/5ML suspension Generic drug:  magnesium hydroxide Take 30 mLs by mouth daily as needed for mild constipation.   nystatin powder Commonly known as:  MYCOSTATIN/NYSTOP Apply topically 3 (three) times daily.   OXYGEN Inhale 2 L/min into the lungs continuous. 2 liter/min via nasal cannula   potassium chloride SA 20 MEQ tablet Commonly known as:  K-DUR,KLOR-CON Take  20 mEq by mouth daily.   torsemide 20 MG tablet Commonly known as:  DEMADEX Take 1.5 tablets (30 mg total) by mouth daily.   vitamin C 500 MG tablet Commonly known as:  ASCORBIC ACID Take 500 mg by mouth 2 (two) times daily.   Zinc Sulfate 110 MG Tabs Take 220 mg by mouth daily.       Discharge Instructions: Please refer to Patient Instructions section of EMR for full details.  Patient was counseled important signs and symptoms that should prompt return to medical care, changes in medications, dietary instructions, activity restrictions, and follow up appointments.   Follow-Up Appointments: Contact information for after-discharge care    Destination    [facility] .   Specialty:  [place] information: [address] [place], MD 01/03/2017, 2:34 PM PGY-2, [place]
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
## 2017-01-05 NOTE — Progress Notes (Signed)
This encounter was created in error - please disregard.

## 2017-01-06 ENCOUNTER — Non-Acute Institutional Stay (SKILLED_NURSING_FACILITY): Payer: Medicare Other | Admitting: Adult Health

## 2017-01-06 ENCOUNTER — Telehealth: Payer: Self-pay

## 2017-01-06 ENCOUNTER — Encounter: Payer: Self-pay | Admitting: Adult Health

## 2017-01-06 DIAGNOSIS — E1140 Type 2 diabetes mellitus with diabetic neuropathy, unspecified: Secondary | ICD-10-CM | POA: Diagnosis not present

## 2017-01-06 DIAGNOSIS — G894 Chronic pain syndrome: Secondary | ICD-10-CM | POA: Diagnosis not present

## 2017-01-06 DIAGNOSIS — E1143 Type 2 diabetes mellitus with diabetic autonomic (poly)neuropathy: Secondary | ICD-10-CM | POA: Diagnosis not present

## 2017-01-06 DIAGNOSIS — G4733 Obstructive sleep apnea (adult) (pediatric): Secondary | ICD-10-CM

## 2017-01-06 DIAGNOSIS — Z89511 Acquired absence of right leg below knee: Secondary | ICD-10-CM

## 2017-01-06 DIAGNOSIS — N183 Chronic kidney disease, stage 3 unspecified: Secondary | ICD-10-CM

## 2017-01-06 DIAGNOSIS — I130 Hypertensive heart and chronic kidney disease with heart failure and stage 1 through stage 4 chronic kidney disease, or unspecified chronic kidney disease: Secondary | ICD-10-CM | POA: Diagnosis not present

## 2017-01-06 DIAGNOSIS — I5032 Chronic diastolic (congestive) heart failure: Secondary | ICD-10-CM

## 2017-01-06 DIAGNOSIS — J9612 Chronic respiratory failure with hypercapnia: Secondary | ICD-10-CM | POA: Diagnosis not present

## 2017-01-06 DIAGNOSIS — L03116 Cellulitis of left lower limb: Secondary | ICD-10-CM

## 2017-01-06 DIAGNOSIS — I509 Heart failure, unspecified: Secondary | ICD-10-CM | POA: Diagnosis not present

## 2017-01-06 DIAGNOSIS — IMO0002 Reserved for concepts with insufficient information to code with codable children: Secondary | ICD-10-CM

## 2017-01-06 DIAGNOSIS — Z794 Long term (current) use of insulin: Secondary | ICD-10-CM

## 2017-01-06 DIAGNOSIS — E1165 Type 2 diabetes mellitus with hyperglycemia: Secondary | ICD-10-CM

## 2017-01-06 NOTE — Telephone Encounter (Signed)
This is a patient of Alger, who was admitted to [facility] after hospitalization. [facility] F/U is needed, may have already been seen this morning. Hospital discharge from [facility] on 01/03/17.
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
## 2017-01-07 DIAGNOSIS — I83023 Varicose veins of left lower extremity with ulcer of ankle: Secondary | ICD-10-CM | POA: Diagnosis not present

## 2017-01-08 ENCOUNTER — Non-Acute Institutional Stay (SKILLED_NURSING_FACILITY): Payer: Medicare Other | Admitting: Internal Medicine

## 2017-01-08 ENCOUNTER — Encounter: Payer: Self-pay | Admitting: Internal Medicine

## 2017-01-08 DIAGNOSIS — N179 Acute kidney failure, unspecified: Secondary | ICD-10-CM

## 2017-01-08 DIAGNOSIS — S81802D Unspecified open wound, left lower leg, subsequent encounter: Secondary | ICD-10-CM

## 2017-01-08 DIAGNOSIS — L03116 Cellulitis of left lower limb: Secondary | ICD-10-CM | POA: Diagnosis not present

## 2017-01-08 DIAGNOSIS — N183 Chronic kidney disease, stage 3 (moderate): Secondary | ICD-10-CM

## 2017-01-08 DIAGNOSIS — J9622 Acute and chronic respiratory failure with hypercapnia: Secondary | ICD-10-CM | POA: Diagnosis not present

## 2017-01-08 NOTE — Progress Notes (Signed)
Patient ID: Heather Zimmerman. [place], female   DOB: Dec 01, 1945, 72 y.o.   MRN: [id number]   This is a nursing facility follow up for [name] readmission within 30 days  Interim medical record and care since last [place] visit was updated with review of diagnostic studies and change in clinical status since last visit were documented.  HPI: The patient was hospitalized 3/17-3/23/18 with sepsis in the context of left lower extremity cellulitis and hypercarbic respiratory failure. This was associated with acute encephalopathy and acute on chronic renal insufficiency. Temperature was 101.3 at admission with tachycardia,hypotension and tachypnea. White count was 50,932 ;systolic blood pressures were 55/72 . Chest x-ray revealed trace bilateral pleural effusions and mild pulmonary edema but no acute infiltrate. She received vancomycin and Zosyn empirically. Neosynephrine was used for pressure support, this was weaned by day 3. CT of the left leg revealed cellulitis without abscess so she  was transitioned to oral doxycycline 3/22 to complete a 14 day course of antibiotics. Acute on chronic hypercarbic respiratory failure was in the context of OHS & OSA requiring  CPAP at night and 2 L /min pre admission. While in the ICU for pressure support she received BiPAP. Unna wrap was applied to the left lower extremity. Wound care was to be continued in the SNF. At D/C  labs revealed normal white count & normochromic normocytic anemia with hemoglobin 10.4 ;hematocrit 33.2. Creatinine stabilized at 1.22  Her most recent A1c was 7% on 11/27/16, indicating excellent diabetic control. In June 2017 it had been 10.1%.  Review of systems:  Reports occasional numbness and tingling of the hands, especially right hand.  Reports occasional headaches relieved with Tylenol.  Ongoing tenderness and swelling of left leg, improved since hospitalization.   Constitutional: No fever,significant weight change,  fatigue  Eyes: No redness, discharge, pain, vision change ENT/mouth: No nasal congestion,  purulent discharge, earache,change in hearing ,sore throat  Cardiovascular: No chest pain, palpitations,paroxysmal nocturnal dyspnea, claudication  Respiratory: No cough, sputum production,hemoptysis, significant snoring Gastrointestinal: No heartburn,dysphagia,abdominal pain, nausea / vomiting,rectal bleeding, melena,change in bowels Genitourinary: No dysuria,hematuria, pyuria,  incontinence, nocturia Musculoskeletal: No joint stiffness, joint swelling, weakness,pain Dermatologic: No rash, change in appearance of skin Neurologic: No dizziness, syncope, seizures Psychiatric: No significant anxiety , depression, insomnia, anorexia Endocrine: No change in hair/skin/ nails, excessive thirst, excessive hunger, excessive urination  Hematologic/lymphatic: No significant bruising, lymphadenopathy,abnormal bleeding Allergy/immunology: No itchy/ watery eyes, significant sneezing, urticaria, angioedema  Physical exam:  Pertinent or positive findings: Morbidly obese exotropia of right eye Hirsutism of chin.  Speech is mumbled.  Multiple missing upper teeth  Distant heart sounds Lung sounds diminished in bases Left lower extremity wrapped in compression dressing.  Swelling of left lower extremity medially above knee with slight tenderness and induration.  Right below the knee amputation.   General appearance:Adequately nourished; no acute distress , increased work of breathing is present.   Lymphatic: No lymphadenopathy about the head, neck, axilla . Eyes: No conjunctival inflammation or lid edema is present. There is no scleral icterus. Ears:  External ear exam shows no significant lesions or deformities.   Nose:  External nasal examination shows no deformity or inflammation. Nasal mucosa are pink and moist without lesions ,exudates Oral exam: lips and gums are healthy appearing.There is no oropharyngeal  erythema or exudate . Neck:  No thyromegaly, masses, tenderness noted.    Heart:  Normal rate and regular rhythm. S1 and S2 normal without gallop, murmur, click, rub .  Lungs:Chest without  wheezes, rhonchi, rales, rubs. Abdomen:Bowel sounds are normal. Abdomen is soft and nontender with no organomegaly, hernias,masses. GU: deferred  Extremities:  No cyanosis, clubbing Skin: Warm & dry w/o tenting. No significant lesions or rash.  See summary under each active problem in the Problem List with associated updated therapeutic plan as well as for each new diagnosis @ this visit  #1) Cellulitis- Continue doxyycline until 01/16/17.  Continue wound care to left leg.     #2) Diabetes-

## 2017-01-10 ENCOUNTER — Encounter: Payer: Self-pay | Admitting: Internal Medicine

## 2017-01-10 NOTE — Patient Instructions (Addendum)
See assessment and plan under each diagnosis in the problem list and acutely for this visit I explained to her the importance of glucose control in preventing recurrent infections as well as optimizing response to therapy for such  Glucose control and wound care are paramount

## 2017-01-10 NOTE — Assessment & Plan Note (Signed)
Wound care supervision at [facility]

## 2017-01-10 NOTE — Assessment & Plan Note (Signed)
Clinically resolved  continue nasal oxygen and nocturnal CPAP

## 2017-01-10 NOTE — Assessment & Plan Note (Signed)
Creatinine has stabilized at 1.22 Gabapentin dose will need to be decreased if creatinine clearance falls below 30

## 2017-01-10 NOTE — Assessment & Plan Note (Signed)
Continue doxycycline through 01/16/17 to complete 14 day course.

## 2017-01-12 DIAGNOSIS — M25511 Pain in right shoulder: Secondary | ICD-10-CM | POA: Diagnosis not present

## 2017-01-12 DIAGNOSIS — A419 Sepsis, unspecified organism: Secondary | ICD-10-CM | POA: Diagnosis not present

## 2017-01-12 DIAGNOSIS — J9620 Acute and chronic respiratory failure, unspecified whether with hypoxia or hypercapnia: Secondary | ICD-10-CM | POA: Diagnosis not present

## 2017-01-12 DIAGNOSIS — M6281 Muscle weakness (generalized): Secondary | ICD-10-CM | POA: Diagnosis not present

## 2017-01-12 DIAGNOSIS — G9340 Encephalopathy, unspecified: Secondary | ICD-10-CM | POA: Diagnosis not present

## 2017-01-12 DIAGNOSIS — L03116 Cellulitis of left lower limb: Secondary | ICD-10-CM | POA: Diagnosis not present

## 2017-01-12 DIAGNOSIS — L0390 Cellulitis, unspecified: Secondary | ICD-10-CM | POA: Diagnosis not present

## 2017-01-13 DIAGNOSIS — L0390 Cellulitis, unspecified: Secondary | ICD-10-CM | POA: Diagnosis not present

## 2017-01-13 DIAGNOSIS — G9340 Encephalopathy, unspecified: Secondary | ICD-10-CM | POA: Diagnosis not present

## 2017-01-13 DIAGNOSIS — A419 Sepsis, unspecified organism: Secondary | ICD-10-CM | POA: Diagnosis not present

## 2017-01-13 DIAGNOSIS — J9620 Acute and chronic respiratory failure, unspecified whether with hypoxia or hypercapnia: Secondary | ICD-10-CM | POA: Diagnosis not present

## 2017-01-13 DIAGNOSIS — M6281 Muscle weakness (generalized): Secondary | ICD-10-CM | POA: Diagnosis not present

## 2017-01-13 DIAGNOSIS — L03116 Cellulitis of left lower limb: Secondary | ICD-10-CM | POA: Diagnosis not present

## 2017-01-14 DIAGNOSIS — M6281 Muscle weakness (generalized): Secondary | ICD-10-CM | POA: Diagnosis not present

## 2017-01-14 DIAGNOSIS — L0390 Cellulitis, unspecified: Secondary | ICD-10-CM | POA: Diagnosis not present

## 2017-01-14 DIAGNOSIS — L03116 Cellulitis of left lower limb: Secondary | ICD-10-CM | POA: Diagnosis not present

## 2017-01-14 DIAGNOSIS — A419 Sepsis, unspecified organism: Secondary | ICD-10-CM | POA: Diagnosis not present

## 2017-01-14 DIAGNOSIS — I83022 Varicose veins of left lower extremity with ulcer of calf: Secondary | ICD-10-CM | POA: Diagnosis not present

## 2017-01-14 DIAGNOSIS — J9620 Acute and chronic respiratory failure, unspecified whether with hypoxia or hypercapnia: Secondary | ICD-10-CM | POA: Diagnosis not present

## 2017-01-14 DIAGNOSIS — G9340 Encephalopathy, unspecified: Secondary | ICD-10-CM | POA: Diagnosis not present

## 2017-01-15 DIAGNOSIS — L0390 Cellulitis, unspecified: Secondary | ICD-10-CM | POA: Diagnosis not present

## 2017-01-15 DIAGNOSIS — A419 Sepsis, unspecified organism: Secondary | ICD-10-CM | POA: Diagnosis not present

## 2017-01-15 DIAGNOSIS — M6281 Muscle weakness (generalized): Secondary | ICD-10-CM | POA: Diagnosis not present

## 2017-01-15 DIAGNOSIS — J9620 Acute and chronic respiratory failure, unspecified whether with hypoxia or hypercapnia: Secondary | ICD-10-CM | POA: Diagnosis not present

## 2017-01-15 DIAGNOSIS — L03116 Cellulitis of left lower limb: Secondary | ICD-10-CM | POA: Diagnosis not present

## 2017-01-15 DIAGNOSIS — G9340 Encephalopathy, unspecified: Secondary | ICD-10-CM | POA: Diagnosis not present

## 2017-01-16 DIAGNOSIS — G9340 Encephalopathy, unspecified: Secondary | ICD-10-CM | POA: Diagnosis not present

## 2017-01-16 DIAGNOSIS — A419 Sepsis, unspecified organism: Secondary | ICD-10-CM | POA: Diagnosis not present

## 2017-01-16 DIAGNOSIS — L0390 Cellulitis, unspecified: Secondary | ICD-10-CM | POA: Diagnosis not present

## 2017-01-16 DIAGNOSIS — L03116 Cellulitis of left lower limb: Secondary | ICD-10-CM | POA: Diagnosis not present

## 2017-01-16 DIAGNOSIS — J9620 Acute and chronic respiratory failure, unspecified whether with hypoxia or hypercapnia: Secondary | ICD-10-CM | POA: Diagnosis not present

## 2017-01-16 DIAGNOSIS — M6281 Muscle weakness (generalized): Secondary | ICD-10-CM | POA: Diagnosis not present

## 2017-01-17 DIAGNOSIS — L03116 Cellulitis of left lower limb: Secondary | ICD-10-CM | POA: Diagnosis not present

## 2017-01-17 DIAGNOSIS — J9620 Acute and chronic respiratory failure, unspecified whether with hypoxia or hypercapnia: Secondary | ICD-10-CM | POA: Diagnosis not present

## 2017-01-17 DIAGNOSIS — G9340 Encephalopathy, unspecified: Secondary | ICD-10-CM | POA: Diagnosis not present

## 2017-01-17 DIAGNOSIS — A419 Sepsis, unspecified organism: Secondary | ICD-10-CM | POA: Diagnosis not present

## 2017-01-17 DIAGNOSIS — L0390 Cellulitis, unspecified: Secondary | ICD-10-CM | POA: Diagnosis not present

## 2017-01-17 DIAGNOSIS — M6281 Muscle weakness (generalized): Secondary | ICD-10-CM | POA: Diagnosis not present

## 2017-01-18 DIAGNOSIS — L0390 Cellulitis, unspecified: Secondary | ICD-10-CM | POA: Diagnosis not present

## 2017-01-18 DIAGNOSIS — A419 Sepsis, unspecified organism: Secondary | ICD-10-CM | POA: Diagnosis not present

## 2017-01-18 DIAGNOSIS — M6281 Muscle weakness (generalized): Secondary | ICD-10-CM | POA: Diagnosis not present

## 2017-01-18 DIAGNOSIS — G9340 Encephalopathy, unspecified: Secondary | ICD-10-CM | POA: Diagnosis not present

## 2017-01-18 DIAGNOSIS — L03116 Cellulitis of left lower limb: Secondary | ICD-10-CM | POA: Diagnosis not present

## 2017-01-18 DIAGNOSIS — J9620 Acute and chronic respiratory failure, unspecified whether with hypoxia or hypercapnia: Secondary | ICD-10-CM | POA: Diagnosis not present

## 2017-01-19 DIAGNOSIS — L0390 Cellulitis, unspecified: Secondary | ICD-10-CM | POA: Diagnosis not present

## 2017-01-19 DIAGNOSIS — M6281 Muscle weakness (generalized): Secondary | ICD-10-CM | POA: Diagnosis not present

## 2017-01-19 DIAGNOSIS — G9340 Encephalopathy, unspecified: Secondary | ICD-10-CM | POA: Diagnosis not present

## 2017-01-19 DIAGNOSIS — A419 Sepsis, unspecified organism: Secondary | ICD-10-CM | POA: Diagnosis not present

## 2017-01-19 DIAGNOSIS — L03116 Cellulitis of left lower limb: Secondary | ICD-10-CM | POA: Diagnosis not present

## 2017-01-19 DIAGNOSIS — J9620 Acute and chronic respiratory failure, unspecified whether with hypoxia or hypercapnia: Secondary | ICD-10-CM | POA: Diagnosis not present

## 2017-01-20 DIAGNOSIS — L0390 Cellulitis, unspecified: Secondary | ICD-10-CM | POA: Diagnosis not present

## 2017-01-20 DIAGNOSIS — L03116 Cellulitis of left lower limb: Secondary | ICD-10-CM | POA: Diagnosis not present

## 2017-01-20 DIAGNOSIS — A419 Sepsis, unspecified organism: Secondary | ICD-10-CM | POA: Diagnosis not present

## 2017-01-20 DIAGNOSIS — J9620 Acute and chronic respiratory failure, unspecified whether with hypoxia or hypercapnia: Secondary | ICD-10-CM | POA: Diagnosis not present

## 2017-01-20 DIAGNOSIS — M6281 Muscle weakness (generalized): Secondary | ICD-10-CM | POA: Diagnosis not present

## 2017-01-20 DIAGNOSIS — G9340 Encephalopathy, unspecified: Secondary | ICD-10-CM | POA: Diagnosis not present

## 2017-01-21 DIAGNOSIS — I83022 Varicose veins of left lower extremity with ulcer of calf: Secondary | ICD-10-CM | POA: Diagnosis not present

## 2017-01-22 DIAGNOSIS — M6281 Muscle weakness (generalized): Secondary | ICD-10-CM | POA: Diagnosis not present

## 2017-01-22 DIAGNOSIS — G9340 Encephalopathy, unspecified: Secondary | ICD-10-CM | POA: Diagnosis not present

## 2017-01-22 DIAGNOSIS — L03116 Cellulitis of left lower limb: Secondary | ICD-10-CM | POA: Diagnosis not present

## 2017-01-22 DIAGNOSIS — A419 Sepsis, unspecified organism: Secondary | ICD-10-CM | POA: Diagnosis not present

## 2017-01-22 DIAGNOSIS — J9620 Acute and chronic respiratory failure, unspecified whether with hypoxia or hypercapnia: Secondary | ICD-10-CM | POA: Diagnosis not present

## 2017-01-22 DIAGNOSIS — L0390 Cellulitis, unspecified: Secondary | ICD-10-CM | POA: Diagnosis not present

## 2017-01-23 DIAGNOSIS — G9340 Encephalopathy, unspecified: Secondary | ICD-10-CM | POA: Diagnosis not present

## 2017-01-23 DIAGNOSIS — L0390 Cellulitis, unspecified: Secondary | ICD-10-CM | POA: Diagnosis not present

## 2017-01-23 DIAGNOSIS — L03116 Cellulitis of left lower limb: Secondary | ICD-10-CM | POA: Diagnosis not present

## 2017-01-23 DIAGNOSIS — A419 Sepsis, unspecified organism: Secondary | ICD-10-CM | POA: Diagnosis not present

## 2017-01-23 DIAGNOSIS — M6281 Muscle weakness (generalized): Secondary | ICD-10-CM | POA: Diagnosis not present

## 2017-01-23 DIAGNOSIS — J9620 Acute and chronic respiratory failure, unspecified whether with hypoxia or hypercapnia: Secondary | ICD-10-CM | POA: Diagnosis not present

## 2017-01-24 DIAGNOSIS — L0390 Cellulitis, unspecified: Secondary | ICD-10-CM | POA: Diagnosis not present

## 2017-01-24 DIAGNOSIS — M6281 Muscle weakness (generalized): Secondary | ICD-10-CM | POA: Diagnosis not present

## 2017-01-24 DIAGNOSIS — J9620 Acute and chronic respiratory failure, unspecified whether with hypoxia or hypercapnia: Secondary | ICD-10-CM | POA: Diagnosis not present

## 2017-01-24 DIAGNOSIS — G9340 Encephalopathy, unspecified: Secondary | ICD-10-CM | POA: Diagnosis not present

## 2017-01-24 DIAGNOSIS — L03116 Cellulitis of left lower limb: Secondary | ICD-10-CM | POA: Diagnosis not present

## 2017-01-24 DIAGNOSIS — A419 Sepsis, unspecified organism: Secondary | ICD-10-CM | POA: Diagnosis not present

## 2017-01-27 DIAGNOSIS — L03116 Cellulitis of left lower limb: Secondary | ICD-10-CM | POA: Diagnosis not present

## 2017-01-27 DIAGNOSIS — M6281 Muscle weakness (generalized): Secondary | ICD-10-CM | POA: Diagnosis not present

## 2017-01-27 DIAGNOSIS — G9340 Encephalopathy, unspecified: Secondary | ICD-10-CM | POA: Diagnosis not present

## 2017-01-27 DIAGNOSIS — J9620 Acute and chronic respiratory failure, unspecified whether with hypoxia or hypercapnia: Secondary | ICD-10-CM | POA: Diagnosis not present

## 2017-01-27 DIAGNOSIS — A419 Sepsis, unspecified organism: Secondary | ICD-10-CM | POA: Diagnosis not present

## 2017-01-27 DIAGNOSIS — L0390 Cellulitis, unspecified: Secondary | ICD-10-CM | POA: Diagnosis not present

## 2017-01-28 DIAGNOSIS — G9340 Encephalopathy, unspecified: Secondary | ICD-10-CM | POA: Diagnosis not present

## 2017-01-28 DIAGNOSIS — M6281 Muscle weakness (generalized): Secondary | ICD-10-CM | POA: Diagnosis not present

## 2017-01-28 DIAGNOSIS — L03116 Cellulitis of left lower limb: Secondary | ICD-10-CM | POA: Diagnosis not present

## 2017-01-28 DIAGNOSIS — L0390 Cellulitis, unspecified: Secondary | ICD-10-CM | POA: Diagnosis not present

## 2017-01-28 DIAGNOSIS — J9620 Acute and chronic respiratory failure, unspecified whether with hypoxia or hypercapnia: Secondary | ICD-10-CM | POA: Diagnosis not present

## 2017-01-28 DIAGNOSIS — A419 Sepsis, unspecified organism: Secondary | ICD-10-CM | POA: Diagnosis not present

## 2017-01-29 DIAGNOSIS — J9620 Acute and chronic respiratory failure, unspecified whether with hypoxia or hypercapnia: Secondary | ICD-10-CM | POA: Diagnosis not present

## 2017-01-29 DIAGNOSIS — G9340 Encephalopathy, unspecified: Secondary | ICD-10-CM | POA: Diagnosis not present

## 2017-01-29 DIAGNOSIS — L0390 Cellulitis, unspecified: Secondary | ICD-10-CM | POA: Diagnosis not present

## 2017-01-29 DIAGNOSIS — A419 Sepsis, unspecified organism: Secondary | ICD-10-CM | POA: Diagnosis not present

## 2017-01-29 DIAGNOSIS — M6281 Muscle weakness (generalized): Secondary | ICD-10-CM | POA: Diagnosis not present

## 2017-01-29 DIAGNOSIS — L03116 Cellulitis of left lower limb: Secondary | ICD-10-CM | POA: Diagnosis not present

## 2017-01-30 ENCOUNTER — Non-Acute Institutional Stay (SKILLED_NURSING_FACILITY): Payer: Medicare Other | Admitting: Adult Health

## 2017-01-30 ENCOUNTER — Encounter: Payer: Self-pay | Admitting: Adult Health

## 2017-01-30 DIAGNOSIS — J9612 Chronic respiratory failure with hypercapnia: Secondary | ICD-10-CM

## 2017-01-30 DIAGNOSIS — G4733 Obstructive sleep apnea (adult) (pediatric): Secondary | ICD-10-CM | POA: Diagnosis not present

## 2017-01-30 DIAGNOSIS — L03116 Cellulitis of left lower limb: Secondary | ICD-10-CM | POA: Diagnosis not present

## 2017-01-30 DIAGNOSIS — G9340 Encephalopathy, unspecified: Secondary | ICD-10-CM | POA: Diagnosis not present

## 2017-01-30 DIAGNOSIS — J9620 Acute and chronic respiratory failure, unspecified whether with hypoxia or hypercapnia: Secondary | ICD-10-CM | POA: Diagnosis not present

## 2017-01-30 DIAGNOSIS — A419 Sepsis, unspecified organism: Secondary | ICD-10-CM | POA: Diagnosis not present

## 2017-01-30 DIAGNOSIS — I5032 Chronic diastolic (congestive) heart failure: Secondary | ICD-10-CM

## 2017-01-30 DIAGNOSIS — M6281 Muscle weakness (generalized): Secondary | ICD-10-CM | POA: Diagnosis not present

## 2017-01-30 DIAGNOSIS — I130 Hypertensive heart and chronic kidney disease with heart failure and stage 1 through stage 4 chronic kidney disease, or unspecified chronic kidney disease: Secondary | ICD-10-CM | POA: Diagnosis not present

## 2017-01-30 DIAGNOSIS — E1140 Type 2 diabetes mellitus with diabetic neuropathy, unspecified: Secondary | ICD-10-CM | POA: Diagnosis not present

## 2017-01-30 DIAGNOSIS — Z794 Long term (current) use of insulin: Secondary | ICD-10-CM

## 2017-01-30 DIAGNOSIS — G894 Chronic pain syndrome: Secondary | ICD-10-CM

## 2017-01-30 DIAGNOSIS — E1143 Type 2 diabetes mellitus with diabetic autonomic (poly)neuropathy: Secondary | ICD-10-CM | POA: Diagnosis not present

## 2017-01-30 DIAGNOSIS — E1165 Type 2 diabetes mellitus with hyperglycemia: Secondary | ICD-10-CM | POA: Diagnosis not present

## 2017-01-30 DIAGNOSIS — N183 Chronic kidney disease, stage 3 unspecified: Secondary | ICD-10-CM

## 2017-01-30 DIAGNOSIS — IMO0002 Reserved for concepts with insufficient information to code with codable children: Secondary | ICD-10-CM

## 2017-01-30 DIAGNOSIS — L0390 Cellulitis, unspecified: Secondary | ICD-10-CM | POA: Diagnosis not present
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
## 2017-01-31 DIAGNOSIS — A419 Sepsis, unspecified organism: Secondary | ICD-10-CM | POA: Diagnosis not present

## 2017-01-31 DIAGNOSIS — G9340 Encephalopathy, unspecified: Secondary | ICD-10-CM | POA: Diagnosis not present

## 2017-01-31 DIAGNOSIS — M6281 Muscle weakness (generalized): Secondary | ICD-10-CM | POA: Diagnosis not present

## 2017-01-31 DIAGNOSIS — L03116 Cellulitis of left lower limb: Secondary | ICD-10-CM | POA: Diagnosis not present

## 2017-01-31 DIAGNOSIS — L0390 Cellulitis, unspecified: Secondary | ICD-10-CM | POA: Diagnosis not present

## 2017-01-31 DIAGNOSIS — J9620 Acute and chronic respiratory failure, unspecified whether with hypoxia or hypercapnia: Secondary | ICD-10-CM | POA: Diagnosis not present

## 2017-02-01 LAB — LIPID PANEL
CHOLESTEROL: 183 mg/dL (ref 0–200)
HDL: 53 mg/dL (ref 35–70)
LDL Cholesterol: 111 mg/dL
TRIGLYCERIDES: 97 mg/dL (ref 40–160)

## 2017-02-01 LAB — MICROALBUMIN, URINE: Microalb, Ur: 4.2

## 2017-02-03 DIAGNOSIS — J9620 Acute and chronic respiratory failure, unspecified whether with hypoxia or hypercapnia: Secondary | ICD-10-CM | POA: Diagnosis not present

## 2017-02-03 DIAGNOSIS — G9340 Encephalopathy, unspecified: Secondary | ICD-10-CM | POA: Diagnosis not present

## 2017-02-03 DIAGNOSIS — L03116 Cellulitis of left lower limb: Secondary | ICD-10-CM | POA: Diagnosis not present

## 2017-02-03 DIAGNOSIS — L0390 Cellulitis, unspecified: Secondary | ICD-10-CM | POA: Diagnosis not present

## 2017-02-03 DIAGNOSIS — A419 Sepsis, unspecified organism: Secondary | ICD-10-CM | POA: Diagnosis not present

## 2017-02-03 DIAGNOSIS — M6281 Muscle weakness (generalized): Secondary | ICD-10-CM | POA: Diagnosis not present

## 2017-02-04 DIAGNOSIS — L0390 Cellulitis, unspecified: Secondary | ICD-10-CM | POA: Diagnosis not present

## 2017-02-04 DIAGNOSIS — M6281 Muscle weakness (generalized): Secondary | ICD-10-CM | POA: Diagnosis not present

## 2017-02-04 DIAGNOSIS — A419 Sepsis, unspecified organism: Secondary | ICD-10-CM | POA: Diagnosis not present

## 2017-02-04 DIAGNOSIS — L03116 Cellulitis of left lower limb: Secondary | ICD-10-CM | POA: Diagnosis not present

## 2017-02-04 DIAGNOSIS — J9620 Acute and chronic respiratory failure, unspecified whether with hypoxia or hypercapnia: Secondary | ICD-10-CM | POA: Diagnosis not present

## 2017-02-04 DIAGNOSIS — G9340 Encephalopathy, unspecified: Secondary | ICD-10-CM | POA: Diagnosis not present

## 2017-02-05 DIAGNOSIS — L0390 Cellulitis, unspecified: Secondary | ICD-10-CM | POA: Diagnosis not present

## 2017-02-05 DIAGNOSIS — M6281 Muscle weakness (generalized): Secondary | ICD-10-CM | POA: Diagnosis not present

## 2017-02-05 DIAGNOSIS — L03116 Cellulitis of left lower limb: Secondary | ICD-10-CM | POA: Diagnosis not present

## 2017-02-05 DIAGNOSIS — J9620 Acute and chronic respiratory failure, unspecified whether with hypoxia or hypercapnia: Secondary | ICD-10-CM | POA: Diagnosis not present

## 2017-02-05 DIAGNOSIS — G9340 Encephalopathy, unspecified: Secondary | ICD-10-CM | POA: Diagnosis not present

## 2017-02-05 DIAGNOSIS — A419 Sepsis, unspecified organism: Secondary | ICD-10-CM | POA: Diagnosis not present

## 2017-02-06 DIAGNOSIS — J9620 Acute and chronic respiratory failure, unspecified whether with hypoxia or hypercapnia: Secondary | ICD-10-CM | POA: Diagnosis not present

## 2017-02-06 DIAGNOSIS — M6281 Muscle weakness (generalized): Secondary | ICD-10-CM | POA: Diagnosis not present

## 2017-02-06 DIAGNOSIS — L0390 Cellulitis, unspecified: Secondary | ICD-10-CM | POA: Diagnosis not present

## 2017-02-06 DIAGNOSIS — L03116 Cellulitis of left lower limb: Secondary | ICD-10-CM | POA: Diagnosis not present

## 2017-02-06 DIAGNOSIS — A419 Sepsis, unspecified organism: Secondary | ICD-10-CM | POA: Diagnosis not present

## 2017-02-06 DIAGNOSIS — G9340 Encephalopathy, unspecified: Secondary | ICD-10-CM | POA: Diagnosis not present

## 2017-02-07 DIAGNOSIS — G9340 Encephalopathy, unspecified: Secondary | ICD-10-CM | POA: Diagnosis not present

## 2017-02-07 DIAGNOSIS — L0390 Cellulitis, unspecified: Secondary | ICD-10-CM | POA: Diagnosis not present

## 2017-02-07 DIAGNOSIS — A419 Sepsis, unspecified organism: Secondary | ICD-10-CM | POA: Diagnosis not present

## 2017-02-07 DIAGNOSIS — M6281 Muscle weakness (generalized): Secondary | ICD-10-CM | POA: Diagnosis not present

## 2017-02-07 DIAGNOSIS — L03116 Cellulitis of left lower limb: Secondary | ICD-10-CM | POA: Diagnosis not present

## 2017-02-07 DIAGNOSIS — J9620 Acute and chronic respiratory failure, unspecified whether with hypoxia or hypercapnia: Secondary | ICD-10-CM | POA: Diagnosis not present

## 2017-02-10 DIAGNOSIS — G9340 Encephalopathy, unspecified: Secondary | ICD-10-CM | POA: Diagnosis not present

## 2017-02-10 DIAGNOSIS — A419 Sepsis, unspecified organism: Secondary | ICD-10-CM | POA: Diagnosis not present

## 2017-02-10 DIAGNOSIS — L0390 Cellulitis, unspecified: Secondary | ICD-10-CM | POA: Diagnosis not present

## 2017-02-10 DIAGNOSIS — L03116 Cellulitis of left lower limb: Secondary | ICD-10-CM | POA: Diagnosis not present

## 2017-02-10 DIAGNOSIS — M6281 Muscle weakness (generalized): Secondary | ICD-10-CM | POA: Diagnosis not present

## 2017-02-10 DIAGNOSIS — J9620 Acute and chronic respiratory failure, unspecified whether with hypoxia or hypercapnia: Secondary | ICD-10-CM | POA: Diagnosis not present

## 2017-02-11 DIAGNOSIS — I87312 Chronic venous hypertension (idiopathic) with ulcer of left lower extremity: Secondary | ICD-10-CM | POA: Diagnosis not present

## 2017-02-11 DIAGNOSIS — I130 Hypertensive heart and chronic kidney disease with heart failure and stage 1 through stage 4 chronic kidney disease, or unspecified chronic kidney disease: Secondary | ICD-10-CM | POA: Diagnosis not present

## 2017-02-11 DIAGNOSIS — I83028 Varicose veins of left lower extremity with ulcer other part of lower leg: Secondary | ICD-10-CM | POA: Diagnosis not present

## 2017-02-11 DIAGNOSIS — F329 Major depressive disorder, single episode, unspecified: Secondary | ICD-10-CM | POA: Diagnosis not present

## 2017-02-11 DIAGNOSIS — E109 Type 1 diabetes mellitus without complications: Secondary | ICD-10-CM | POA: Diagnosis not present

## 2017-02-11 DIAGNOSIS — Z0000 Encounter for general adult medical examination without abnormal findings: Secondary | ICD-10-CM | POA: Diagnosis not present

## 2017-02-11 DIAGNOSIS — E119 Type 2 diabetes mellitus without complications: Secondary | ICD-10-CM | POA: Diagnosis not present

## 2017-02-11 DIAGNOSIS — J9612 Chronic respiratory failure with hypercapnia: Secondary | ICD-10-CM | POA: Diagnosis not present

## 2017-02-11 DIAGNOSIS — I10 Essential (primary) hypertension: Secondary | ICD-10-CM | POA: Diagnosis not present

## 2017-02-11 DIAGNOSIS — N390 Urinary tract infection, site not specified: Secondary | ICD-10-CM | POA: Diagnosis not present

## 2017-02-11 DIAGNOSIS — G8929 Other chronic pain: Secondary | ICD-10-CM | POA: Diagnosis not present

## 2017-02-11 DIAGNOSIS — L0390 Cellulitis, unspecified: Secondary | ICD-10-CM | POA: Diagnosis not present

## 2017-02-11 DIAGNOSIS — E1140 Type 2 diabetes mellitus with diabetic neuropathy, unspecified: Secondary | ICD-10-CM | POA: Diagnosis not present

## 2017-02-11 DIAGNOSIS — L03116 Cellulitis of left lower limb: Secondary | ICD-10-CM | POA: Diagnosis not present

## 2017-02-11 DIAGNOSIS — G4733 Obstructive sleep apnea (adult) (pediatric): Secondary | ICD-10-CM | POA: Diagnosis not present

## 2017-02-11 DIAGNOSIS — K219 Gastro-esophageal reflux disease without esophagitis: Secondary | ICD-10-CM | POA: Diagnosis not present

## 2017-02-11 DIAGNOSIS — M6281 Muscle weakness (generalized): Secondary | ICD-10-CM | POA: Diagnosis not present

## 2017-02-11 DIAGNOSIS — E785 Hyperlipidemia, unspecified: Secondary | ICD-10-CM | POA: Diagnosis not present

## 2017-02-11 DIAGNOSIS — E1143 Type 2 diabetes mellitus with diabetic autonomic (poly)neuropathy: Secondary | ICD-10-CM | POA: Diagnosis not present

## 2017-02-11 DIAGNOSIS — J9621 Acute and chronic respiratory failure with hypoxia: Secondary | ICD-10-CM | POA: Diagnosis not present

## 2017-02-11 DIAGNOSIS — I5032 Chronic diastolic (congestive) heart failure: Secondary | ICD-10-CM | POA: Diagnosis not present

## 2017-02-11 DIAGNOSIS — D631 Anemia in chronic kidney disease: Secondary | ICD-10-CM | POA: Diagnosis not present

## 2017-02-11 DIAGNOSIS — J9602 Acute respiratory failure with hypercapnia: Secondary | ICD-10-CM | POA: Diagnosis not present

## 2017-02-11 DIAGNOSIS — Z794 Long term (current) use of insulin: Secondary | ICD-10-CM | POA: Diagnosis not present

## 2017-02-11 DIAGNOSIS — J9620 Acute and chronic respiratory failure, unspecified whether with hypoxia or hypercapnia: Secondary | ICD-10-CM | POA: Diagnosis not present

## 2017-02-11 DIAGNOSIS — G9340 Encephalopathy, unspecified: Secondary | ICD-10-CM | POA: Diagnosis not present

## 2017-02-11 DIAGNOSIS — A419 Sepsis, unspecified organism: Secondary | ICD-10-CM | POA: Diagnosis not present

## 2017-02-11 DIAGNOSIS — E1165 Type 2 diabetes mellitus with hyperglycemia: Secondary | ICD-10-CM | POA: Diagnosis not present

## 2017-02-11 DIAGNOSIS — I7389 Other specified peripheral vascular diseases: Secondary | ICD-10-CM | POA: Diagnosis not present

## 2017-02-11 DIAGNOSIS — M25511 Pain in right shoulder: Secondary | ICD-10-CM | POA: Diagnosis not present

## 2017-02-11 DIAGNOSIS — N183 Chronic kidney disease, stage 3 (moderate): Secondary | ICD-10-CM | POA: Diagnosis not present

## 2017-02-11 DIAGNOSIS — L03115 Cellulitis of right lower limb: Secondary | ICD-10-CM | POA: Diagnosis not present

## 2017-02-11 DIAGNOSIS — E1169 Type 2 diabetes mellitus with other specified complication: Secondary | ICD-10-CM | POA: Diagnosis not present

## 2017-02-28 DIAGNOSIS — F329 Major depressive disorder, single episode, unspecified: Secondary | ICD-10-CM | POA: Diagnosis not present

## 2017-03-03 ENCOUNTER — Encounter: Payer: Self-pay | Admitting: Adult Health

## 2017-03-03 ENCOUNTER — Non-Acute Institutional Stay (SKILLED_NURSING_FACILITY): Payer: Medicare Other | Admitting: Adult Health

## 2017-03-03 DIAGNOSIS — E785 Hyperlipidemia, unspecified: Secondary | ICD-10-CM

## 2017-03-03 DIAGNOSIS — Z794 Long term (current) use of insulin: Secondary | ICD-10-CM | POA: Diagnosis not present

## 2017-03-03 DIAGNOSIS — E1169 Type 2 diabetes mellitus with other specified complication: Secondary | ICD-10-CM | POA: Diagnosis not present

## 2017-03-03 DIAGNOSIS — G4733 Obstructive sleep apnea (adult) (pediatric): Secondary | ICD-10-CM

## 2017-03-03 DIAGNOSIS — I130 Hypertensive heart and chronic kidney disease with heart failure and stage 1 through stage 4 chronic kidney disease, or unspecified chronic kidney disease: Secondary | ICD-10-CM | POA: Diagnosis not present

## 2017-03-03 DIAGNOSIS — J9612 Chronic respiratory failure with hypercapnia: Secondary | ICD-10-CM | POA: Diagnosis not present

## 2017-03-03 DIAGNOSIS — E1143 Type 2 diabetes mellitus with diabetic autonomic (poly)neuropathy: Secondary | ICD-10-CM | POA: Diagnosis not present

## 2017-03-03 DIAGNOSIS — IMO0002 Reserved for concepts with insufficient information to code with codable children: Secondary | ICD-10-CM

## 2017-03-03 DIAGNOSIS — N183 Chronic kidney disease, stage 3 unspecified: Secondary | ICD-10-CM

## 2017-03-03 DIAGNOSIS — I5032 Chronic diastolic (congestive) heart failure: Secondary | ICD-10-CM | POA: Diagnosis not present

## 2017-03-03 DIAGNOSIS — E1140 Type 2 diabetes mellitus with diabetic neuropathy, unspecified: Secondary | ICD-10-CM | POA: Diagnosis not present

## 2017-03-03 DIAGNOSIS — E1165 Type 2 diabetes mellitus with hyperglycemia: Secondary | ICD-10-CM
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
## 2017-03-17 ENCOUNTER — Non-Acute Institutional Stay (SKILLED_NURSING_FACILITY): Payer: Medicare Other

## 2017-03-17 DIAGNOSIS — Z0000 Encounter for general adult medical examination without abnormal findings: Secondary | ICD-10-CM | POA: Diagnosis not present
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
## 2017-03-24 ENCOUNTER — Encounter: Payer: Self-pay | Admitting: Adult Health

## 2017-03-24 ENCOUNTER — Non-Acute Institutional Stay (SKILLED_NURSING_FACILITY): Payer: Medicare Other | Admitting: Adult Health

## 2017-03-24 DIAGNOSIS — IMO0002 Reserved for concepts with insufficient information to code with codable children: Secondary | ICD-10-CM

## 2017-03-24 DIAGNOSIS — E1143 Type 2 diabetes mellitus with diabetic autonomic (poly)neuropathy: Secondary | ICD-10-CM | POA: Diagnosis not present

## 2017-03-24 DIAGNOSIS — I5032 Chronic diastolic (congestive) heart failure: Secondary | ICD-10-CM

## 2017-03-24 DIAGNOSIS — I130 Hypertensive heart and chronic kidney disease with heart failure and stage 1 through stage 4 chronic kidney disease, or unspecified chronic kidney disease: Secondary | ICD-10-CM | POA: Diagnosis not present

## 2017-03-24 DIAGNOSIS — Z794 Long term (current) use of insulin: Secondary | ICD-10-CM | POA: Diagnosis not present

## 2017-03-24 DIAGNOSIS — E1169 Type 2 diabetes mellitus with other specified complication: Secondary | ICD-10-CM | POA: Diagnosis not present

## 2017-03-24 DIAGNOSIS — N183 Chronic kidney disease, stage 3 unspecified: Secondary | ICD-10-CM

## 2017-03-24 DIAGNOSIS — E1140 Type 2 diabetes mellitus with diabetic neuropathy, unspecified: Secondary | ICD-10-CM | POA: Diagnosis not present

## 2017-03-24 DIAGNOSIS — E1165 Type 2 diabetes mellitus with hyperglycemia: Secondary | ICD-10-CM | POA: Diagnosis not present

## 2017-03-24 DIAGNOSIS — J9612 Chronic respiratory failure with hypercapnia: Secondary | ICD-10-CM

## 2017-03-24 DIAGNOSIS — E785 Hyperlipidemia, unspecified: Secondary | ICD-10-CM
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
## 2017-04-01 DIAGNOSIS — I83028 Varicose veins of left lower extremity with ulcer other part of lower leg: Secondary | ICD-10-CM | POA: Diagnosis not present

## 2017-04-04 DIAGNOSIS — E109 Type 1 diabetes mellitus without complications: Secondary | ICD-10-CM | POA: Diagnosis not present

## 2017-04-04 DIAGNOSIS — G4733 Obstructive sleep apnea (adult) (pediatric): Secondary | ICD-10-CM | POA: Diagnosis not present

## 2017-04-04 DIAGNOSIS — I10 Essential (primary) hypertension: Secondary | ICD-10-CM | POA: Diagnosis not present

## 2017-04-08 DIAGNOSIS — I83028 Varicose veins of left lower extremity with ulcer other part of lower leg: Secondary | ICD-10-CM | POA: Diagnosis not present

## 2017-04-15 DIAGNOSIS — I83028 Varicose veins of left lower extremity with ulcer other part of lower leg: Secondary | ICD-10-CM | POA: Diagnosis not present

## 2017-04-22 DIAGNOSIS — I83028 Varicose veins of left lower extremity with ulcer other part of lower leg: Secondary | ICD-10-CM | POA: Diagnosis not present

## 2017-04-24 DIAGNOSIS — G4733 Obstructive sleep apnea (adult) (pediatric): Secondary | ICD-10-CM | POA: Diagnosis not present

## 2017-04-24 DIAGNOSIS — E109 Type 1 diabetes mellitus without complications: Secondary | ICD-10-CM | POA: Diagnosis not present

## 2017-04-24 DIAGNOSIS — K219 Gastro-esophageal reflux disease without esophagitis: Secondary | ICD-10-CM | POA: Diagnosis not present

## 2017-04-24 DIAGNOSIS — N183 Chronic kidney disease, stage 3 (moderate): Secondary | ICD-10-CM | POA: Diagnosis not present

## 2017-04-28 ENCOUNTER — Encounter (HOSPITAL_COMMUNITY): Payer: Self-pay | Admitting: Emergency Medicine

## 2017-04-28 ENCOUNTER — Inpatient Hospital Stay (HOSPITAL_COMMUNITY)
Admission: EM | Admit: 2017-04-28 | Discharge: 2017-05-03 | DRG: 871 | Disposition: A | Discharge status: Skilled Nursing Facility | Payer: Medicare Other | Attending: Internal Medicine | Admitting: Internal Medicine

## 2017-04-28 ENCOUNTER — Emergency Department (HOSPITAL_COMMUNITY): Payer: Medicare Other

## 2017-04-28 DIAGNOSIS — R0902 Hypoxemia: Secondary | ICD-10-CM

## 2017-04-28 DIAGNOSIS — N179 Acute kidney failure, unspecified: Secondary | ICD-10-CM | POA: Diagnosis present

## 2017-04-28 DIAGNOSIS — J95859 Other complication of respirator [ventilator]: Secondary | ICD-10-CM | POA: Diagnosis not present

## 2017-04-28 DIAGNOSIS — R0689 Other abnormalities of breathing: Secondary | ICD-10-CM | POA: Diagnosis not present

## 2017-04-28 DIAGNOSIS — I130 Hypertensive heart and chronic kidney disease with heart failure and stage 1 through stage 4 chronic kidney disease, or unspecified chronic kidney disease: Secondary | ICD-10-CM | POA: Diagnosis present

## 2017-04-28 DIAGNOSIS — R0602 Shortness of breath: Secondary | ICD-10-CM | POA: Diagnosis not present

## 2017-04-28 DIAGNOSIS — G9341 Metabolic encephalopathy: Secondary | ICD-10-CM | POA: Diagnosis not present

## 2017-04-28 DIAGNOSIS — Z9981 Dependence on supplemental oxygen: Secondary | ICD-10-CM

## 2017-04-28 DIAGNOSIS — A419 Sepsis, unspecified organism: Principal | ICD-10-CM | POA: Diagnosis present

## 2017-04-28 DIAGNOSIS — N390 Urinary tract infection, site not specified: Secondary | ICD-10-CM | POA: Diagnosis not present

## 2017-04-28 DIAGNOSIS — R6889 Other general symptoms and signs: Secondary | ICD-10-CM | POA: Diagnosis not present

## 2017-04-28 DIAGNOSIS — Z9119 Patient's noncompliance with other medical treatment and regimen: Secondary | ICD-10-CM

## 2017-04-28 DIAGNOSIS — E1165 Type 2 diabetes mellitus with hyperglycemia: Secondary | ICD-10-CM | POA: Diagnosis present

## 2017-04-28 DIAGNOSIS — E11622 Type 2 diabetes mellitus with other skin ulcer: Secondary | ICD-10-CM | POA: Diagnosis present

## 2017-04-28 DIAGNOSIS — Z79899 Other long term (current) drug therapy: Secondary | ICD-10-CM

## 2017-04-28 DIAGNOSIS — J9621 Acute and chronic respiratory failure with hypoxia: Secondary | ICD-10-CM | POA: Diagnosis not present

## 2017-04-28 DIAGNOSIS — I872 Venous insufficiency (chronic) (peripheral): Secondary | ICD-10-CM | POA: Diagnosis present

## 2017-04-28 DIAGNOSIS — E1122 Type 2 diabetes mellitus with diabetic chronic kidney disease: Secondary | ICD-10-CM | POA: Diagnosis present

## 2017-04-28 DIAGNOSIS — J9622 Acute and chronic respiratory failure with hypercapnia: Secondary | ICD-10-CM | POA: Diagnosis not present

## 2017-04-28 DIAGNOSIS — IMO0002 Reserved for concepts with insufficient information to code with codable children: Secondary | ICD-10-CM | POA: Diagnosis present

## 2017-04-28 DIAGNOSIS — G4733 Obstructive sleep apnea (adult) (pediatric): Secondary | ICD-10-CM | POA: Diagnosis present

## 2017-04-28 DIAGNOSIS — R404 Transient alteration of awareness: Secondary | ICD-10-CM | POA: Diagnosis not present

## 2017-04-28 DIAGNOSIS — Z7982 Long term (current) use of aspirin: Secondary | ICD-10-CM

## 2017-04-28 DIAGNOSIS — S81802A Unspecified open wound, left lower leg, initial encounter: Secondary | ICD-10-CM | POA: Diagnosis present

## 2017-04-28 DIAGNOSIS — N183 Chronic kidney disease, stage 3 (moderate): Secondary | ICD-10-CM | POA: Diagnosis present

## 2017-04-28 DIAGNOSIS — E872 Acidosis: Secondary | ICD-10-CM | POA: Diagnosis present

## 2017-04-28 DIAGNOSIS — Z7951 Long term (current) use of inhaled steroids: Secondary | ICD-10-CM

## 2017-04-28 DIAGNOSIS — R402242 Coma scale, best verbal response, confused conversation, at arrival to emergency department: Secondary | ICD-10-CM | POA: Diagnosis present

## 2017-04-28 DIAGNOSIS — Z6841 Body Mass Index (BMI) 40.0 and over, adult: Secondary | ICD-10-CM

## 2017-04-28 DIAGNOSIS — Z89511 Acquired absence of right leg below knee: Secondary | ICD-10-CM

## 2017-04-28 DIAGNOSIS — I83009 Varicose veins of unspecified lower extremity with ulcer of unspecified site: Secondary | ICD-10-CM | POA: Diagnosis present

## 2017-04-28 DIAGNOSIS — E1151 Type 2 diabetes mellitus with diabetic peripheral angiopathy without gangrene: Secondary | ICD-10-CM | POA: Diagnosis present

## 2017-04-28 DIAGNOSIS — R402132 Coma scale, eyes open, to sound, at arrival to emergency department: Secondary | ICD-10-CM | POA: Diagnosis present

## 2017-04-28 DIAGNOSIS — Z8249 Family history of ischemic heart disease and other diseases of the circulatory system: Secondary | ICD-10-CM

## 2017-04-28 DIAGNOSIS — Z794 Long term (current) use of insulin: Secondary | ICD-10-CM

## 2017-04-28 DIAGNOSIS — R402362 Coma scale, best motor response, obeys commands, at arrival to emergency department: Secondary | ICD-10-CM | POA: Diagnosis present

## 2017-04-28 DIAGNOSIS — I5032 Chronic diastolic (congestive) heart failure: Secondary | ICD-10-CM | POA: Diagnosis present

## 2017-04-28 DIAGNOSIS — Z9109 Other allergy status, other than to drugs and biological substances: Secondary | ICD-10-CM

## 2017-04-28 DIAGNOSIS — L97909 Non-pressure chronic ulcer of unspecified part of unspecified lower leg with unspecified severity: Secondary | ICD-10-CM | POA: Diagnosis present

## 2017-04-28 DIAGNOSIS — E1149 Type 2 diabetes mellitus with other diabetic neurological complication: Secondary | ICD-10-CM | POA: Diagnosis present

## 2017-04-28 LAB — CBC WITH DIFFERENTIAL/PLATELET
BAND NEUTROPHILS: 0 %
BASOS ABS: 0 10*3/uL (ref 0.0–0.1)
BASOS PCT: 0 %
Blasts: 0 %
EOS ABS: 0 10*3/uL (ref 0.0–0.7)
EOS PCT: 0 %
HCT: 30.3 % — ABNORMAL LOW (ref 36.0–46.0)
Hemoglobin: 9.3 g/dL — ABNORMAL LOW (ref 12.0–15.0)
LYMPHS ABS: 2.1 10*3/uL (ref 0.7–4.0)
Lymphocytes Relative: 9 %
MCH: 28.3 pg (ref 26.0–34.0)
MCHC: 30.7 g/dL (ref 30.0–36.0)
MCV: 92.1 fL (ref 78.0–100.0)
METAMYELOCYTES PCT: 0 %
MONO ABS: 0.9 10*3/uL (ref 0.1–1.0)
MYELOCYTES: 0 %
Monocytes Relative: 4 %
Neutro Abs: 20.1 10*3/uL — ABNORMAL HIGH (ref 1.7–7.7)
Neutrophils Relative %: 87 %
Other: 0 %
PLATELETS: 295 10*3/uL (ref 150–400)
Promyelocytes Absolute: 0 %
RBC: 3.29 MIL/uL — ABNORMAL LOW (ref 3.87–5.11)
RDW: 16.1 % — ABNORMAL HIGH (ref 11.5–15.5)
WBC: 23.1 10*3/uL — AB (ref 4.0–10.5)
nRBC: 0 /100 WBC

## 2017-04-28 LAB — URINALYSIS, ROUTINE W REFLEX MICROSCOPIC
BILIRUBIN URINE: NEGATIVE
Glucose, UA: NEGATIVE mg/dL
Hgb urine dipstick: NEGATIVE
Ketones, ur: NEGATIVE mg/dL
Nitrite: NEGATIVE
PROTEIN: 30 mg/dL — AB
SPECIFIC GRAVITY, URINE: 1.005 (ref 1.005–1.030)
pH: 5 (ref 5.0–8.0)

## 2017-04-28 LAB — BRAIN NATRIURETIC PEPTIDE: B NATRIURETIC PEPTIDE 5: 18.8 pg/mL (ref 0.0–100.0)

## 2017-04-28 LAB — I-STAT CG4 LACTIC ACID, ED: LACTIC ACID, VENOUS: 1.46 mmol/L (ref 0.5–1.9)

## 2017-04-28 LAB — COMPREHENSIVE METABOLIC PANEL
ALBUMIN: 3.3 g/dL — AB (ref 3.5–5.0)
ALT: 25 U/L (ref 14–54)
AST: 28 U/L (ref 15–41)
Alkaline Phosphatase: 101 U/L (ref 38–126)
Anion gap: 9 (ref 5–15)
BUN: 37 mg/dL — AB (ref 6–20)
CHLORIDE: 100 mmol/L — AB (ref 101–111)
CO2: 30 mmol/L (ref 22–32)
Calcium: 9.2 mg/dL (ref 8.9–10.3)
Creatinine, Ser: 1.6 mg/dL — ABNORMAL HIGH (ref 0.44–1.00)
GFR calc Af Amer: 36 mL/min — ABNORMAL LOW (ref >60)
GFR, EST NON AFRICAN AMERICAN: 31 mL/min — AB (ref >60)
GLUCOSE: 189 mg/dL — AB (ref 65–99)
POTASSIUM: 4.3 mmol/L (ref 3.5–5.1)
Sodium: 139 mmol/L (ref 135–145)
Total Bilirubin: 1 mg/dL (ref 0.3–1.2)
Total Protein: 8.9 g/dL — ABNORMAL HIGH (ref 6.5–8.1)

## 2017-04-28 LAB — I-STAT VENOUS BLOOD GAS, ED
Acid-Base Excess: 7 mmol/L — ABNORMAL HIGH (ref 0.0–2.0)
Bicarbonate: 36.4 mmol/L — ABNORMAL HIGH (ref 20.0–28.0)
O2 SAT: 22 %
PCO2 VEN: 73.1 mmHg — AB (ref 44.0–60.0)
PH VEN: 7.305 (ref 7.250–7.430)
TCO2: 39 mmol/L (ref 0–100)
pO2, Ven: 18 mmHg — CL (ref 32.0–45.0)

## 2017-04-28 LAB — CBG MONITORING, ED: GLUCOSE-CAPILLARY: 173 mg/dL — AB (ref 65–99)

## 2017-04-28 LAB — RAPID URINE DRUG SCREEN, HOSP PERFORMED
Amphetamines: NOT DETECTED
Barbiturates: NOT DETECTED
Benzodiazepines: NOT DETECTED
Cocaine: NOT DETECTED
Opiates: NOT DETECTED
TETRAHYDROCANNABINOL: NOT DETECTED

## 2017-04-28 LAB — PROTIME-INR
INR: 1.09
Prothrombin Time: 14.1 seconds (ref 11.4–15.2)

## 2017-04-28 LAB — I-STAT TROPONIN, ED: Troponin i, poc: 0 ng/mL (ref 0.00–0.08)

## 2017-04-28 LAB — AMMONIA: AMMONIA: 20 umol/L (ref 9–35)

## 2017-04-28 LAB — LIPASE, BLOOD: LIPASE: 21 U/L (ref 11–51)

## 2017-04-28 MED ORDER — PIPERACILLIN-TAZOBACTAM 3.375 G IVPB
3.3750 g | Freq: Three times a day (TID) | INTRAVENOUS | Status: DC
  Administered 2017-04-29 – 2017-05-03 (×13): 3.375 g via INTRAVENOUS
  Filled 2017-04-28 (×16): qty 50

## 2017-04-28 MED ORDER — VANCOMYCIN HCL 10 G IV SOLR
1500.0000 mg | INTRAVENOUS | Status: DC
  Administered 2017-04-30: 1500 mg via INTRAVENOUS
  Filled 2017-04-28: qty 1500

## 2017-04-28 MED ORDER — VANCOMYCIN HCL 10 G IV SOLR
2000.0000 mg | Freq: Once | INTRAVENOUS | Status: AC
  Administered 2017-04-28: 2000 mg via INTRAVENOUS
  Filled 2017-04-28: qty 2000

## 2017-04-28 MED ORDER — VANCOMYCIN HCL IN DEXTROSE 1-5 GM/200ML-% IV SOLN: 1000.0000 mg | Freq: Once | INTRAVENOUS | Status: DC

## 2017-04-28 MED ORDER — ACETAMINOPHEN 650 MG RE SUPP
650.0000 mg | Freq: Once | RECTAL | Status: AC
  Administered 2017-04-28: 650 mg via RECTAL

## 2017-04-28 MED ORDER — PIPERACILLIN-TAZOBACTAM 3.375 G IVPB 30 MIN
3.3750 g | Freq: Once | INTRAVENOUS | Status: AC
  Administered 2017-04-28: 3.375 g via INTRAVENOUS
  Filled 2017-04-28: qty 50

## 2017-04-28 NOTE — Progress Notes (Signed)
Pharmacy Antibiotic Note  Heather Zimmerman. Heather Zimmerman is a 71 y.o. female admitted on 04/28/2017 with sepsis.  Pharmacy has been consulted for vancomycin and zosyn dosing. Tmax is 102.4. Scr is elevated at 1.6 and lactic acid is <2.   Plan: Vancomycin 2gm IV x 1 then 1500mg  IV Q24H Zosyn 3.375gm IV Q8H (4 hr inf) F/u renal fxn, C&S, clinical status and trough at SS  Height: 4\' 9"  (144.8 cm) Weight: 280 lb (127 kg) IBW/kg (Calculated) : 38.6  Temp (24hrs), Avg:102.4 F (39.1 C), Min:102.4 F (39.1 C), Max:102.4 F (39.1 C)  No results for input(s): WBC, CREATININE, LATICACIDVEN, VANCOTROUGH, VANCOPEAK, VANCORANDOM, GENTTROUGH, GENTPEAK, GENTRANDOM, TOBRATROUGH, TOBRAPEAK, TOBRARND, AMIKACINPEAK, AMIKACINTROU, AMIKACIN in the last 168 hours.  CrCl cannot be calculated (Patient's most recent lab result is older than the maximum 21 days allowed.).    Allergies  Allergen Reactions  . Ace Inhibitors Other (See Comments)    unknown    Antimicrobials this admission: Vanc 7/16>> Zosyn 7/16>>  Dose adjustments this admission: N/A  Microbiology results: Pending  Thank you for allowing pharmacy to be a part of this patient's care.  Aadam Zhen, Rande Lawman 04/28/2017 9:06 PM
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
## 2017-04-28 NOTE — ED Triage Notes (Addendum)
Pt from [facility]. Pt c/o sob, sats were in the 80s upon EMS arrival. Unknown home O2. Pt is 95-100% on 4L [place]. Pt is lethargic, normally A&O. Currently difficult to understand but seems mostly oriented, follows commands. Pt is warm to touch but pt reports feeling chilly. Rt leg BKA. Pt has fluid seeping out of wound on left leg.

## 2017-04-28 NOTE — ED Notes (Signed)
ED Provider at bedside. 

## 2017-04-29 ENCOUNTER — Inpatient Hospital Stay (HOSPITAL_COMMUNITY): Payer: Medicare Other

## 2017-04-29 DIAGNOSIS — E11622 Type 2 diabetes mellitus with other skin ulcer: Secondary | ICD-10-CM | POA: Diagnosis present

## 2017-04-29 DIAGNOSIS — I5032 Chronic diastolic (congestive) heart failure: Secondary | ICD-10-CM | POA: Diagnosis not present

## 2017-04-29 DIAGNOSIS — G9341 Metabolic encephalopathy: Secondary | ICD-10-CM | POA: Diagnosis not present

## 2017-04-29 DIAGNOSIS — J9622 Acute and chronic respiratory failure with hypercapnia: Secondary | ICD-10-CM | POA: Diagnosis not present

## 2017-04-29 DIAGNOSIS — Z9981 Dependence on supplemental oxygen: Secondary | ICD-10-CM | POA: Diagnosis not present

## 2017-04-29 DIAGNOSIS — Z8249 Family history of ischemic heart disease and other diseases of the circulatory system: Secondary | ICD-10-CM | POA: Diagnosis not present

## 2017-04-29 DIAGNOSIS — J9621 Acute and chronic respiratory failure with hypoxia: Secondary | ICD-10-CM

## 2017-04-29 DIAGNOSIS — E1140 Type 2 diabetes mellitus with diabetic neuropathy, unspecified: Secondary | ICD-10-CM

## 2017-04-29 DIAGNOSIS — Z7982 Long term (current) use of aspirin: Secondary | ICD-10-CM | POA: Diagnosis not present

## 2017-04-29 DIAGNOSIS — Z794 Long term (current) use of insulin: Secondary | ICD-10-CM | POA: Diagnosis not present

## 2017-04-29 DIAGNOSIS — Z79899 Other long term (current) drug therapy: Secondary | ICD-10-CM | POA: Diagnosis not present

## 2017-04-29 DIAGNOSIS — R0602 Shortness of breath: Secondary | ICD-10-CM | POA: Diagnosis not present

## 2017-04-29 DIAGNOSIS — R402362 Coma scale, best motor response, obeys commands, at arrival to emergency department: Secondary | ICD-10-CM | POA: Diagnosis present

## 2017-04-29 DIAGNOSIS — I83009 Varicose veins of unspecified lower extremity with ulcer of unspecified site: Secondary | ICD-10-CM | POA: Diagnosis present

## 2017-04-29 DIAGNOSIS — G4733 Obstructive sleep apnea (adult) (pediatric): Secondary | ICD-10-CM

## 2017-04-29 DIAGNOSIS — S81802D Unspecified open wound, left lower leg, subsequent encounter: Secondary | ICD-10-CM | POA: Diagnosis not present

## 2017-04-29 DIAGNOSIS — R0902 Hypoxemia: Secondary | ICD-10-CM | POA: Diagnosis not present

## 2017-04-29 DIAGNOSIS — E1165 Type 2 diabetes mellitus with hyperglycemia: Secondary | ICD-10-CM

## 2017-04-29 DIAGNOSIS — N390 Urinary tract infection, site not specified: Secondary | ICD-10-CM | POA: Diagnosis not present

## 2017-04-29 DIAGNOSIS — L97909 Non-pressure chronic ulcer of unspecified part of unspecified lower leg with unspecified severity: Secondary | ICD-10-CM | POA: Diagnosis present

## 2017-04-29 DIAGNOSIS — E669 Obesity, unspecified: Secondary | ICD-10-CM | POA: Diagnosis not present

## 2017-04-29 DIAGNOSIS — N179 Acute kidney failure, unspecified: Secondary | ICD-10-CM | POA: Diagnosis not present

## 2017-04-29 DIAGNOSIS — E109 Type 1 diabetes mellitus without complications: Secondary | ICD-10-CM | POA: Diagnosis not present

## 2017-04-29 DIAGNOSIS — N183 Chronic kidney disease, stage 3 (moderate): Secondary | ICD-10-CM | POA: Diagnosis present

## 2017-04-29 DIAGNOSIS — Z7951 Long term (current) use of inhaled steroids: Secondary | ICD-10-CM | POA: Diagnosis not present

## 2017-04-29 DIAGNOSIS — I130 Hypertensive heart and chronic kidney disease with heart failure and stage 1 through stage 4 chronic kidney disease, or unspecified chronic kidney disease: Secondary | ICD-10-CM | POA: Diagnosis present

## 2017-04-29 DIAGNOSIS — E1122 Type 2 diabetes mellitus with diabetic chronic kidney disease: Secondary | ICD-10-CM | POA: Diagnosis present

## 2017-04-29 DIAGNOSIS — Z9109 Other allergy status, other than to drugs and biological substances: Secondary | ICD-10-CM | POA: Diagnosis not present

## 2017-04-29 DIAGNOSIS — E872 Acidosis: Secondary | ICD-10-CM | POA: Diagnosis present

## 2017-04-29 DIAGNOSIS — A419 Sepsis, unspecified organism: Principal | ICD-10-CM

## 2017-04-29 DIAGNOSIS — Z7409 Other reduced mobility: Secondary | ICD-10-CM | POA: Diagnosis not present

## 2017-04-29 DIAGNOSIS — Z89511 Acquired absence of right leg below knee: Secondary | ICD-10-CM | POA: Diagnosis not present

## 2017-04-29 DIAGNOSIS — R0689 Other abnormalities of breathing: Secondary | ICD-10-CM | POA: Diagnosis not present

## 2017-04-29 DIAGNOSIS — Z6841 Body Mass Index (BMI) 40.0 and over, adult: Secondary | ICD-10-CM | POA: Diagnosis not present

## 2017-04-29 LAB — BLOOD CULTURE ID PANEL (REFLEXED)
Acinetobacter baumannii: NOT DETECTED
CANDIDA GLABRATA: NOT DETECTED
CANDIDA KRUSEI: NOT DETECTED
Candida albicans: NOT DETECTED
Candida parapsilosis: NOT DETECTED
Candida tropicalis: NOT DETECTED
ENTEROBACTER CLOACAE COMPLEX: NOT DETECTED
ENTEROCOCCUS SPECIES: NOT DETECTED
ESCHERICHIA COLI: NOT DETECTED
Enterobacteriaceae species: NOT DETECTED
Haemophilus influenzae: NOT DETECTED
KLEBSIELLA OXYTOCA: NOT DETECTED
Klebsiella pneumoniae: NOT DETECTED
LISTERIA MONOCYTOGENES: NOT DETECTED
Methicillin resistance: DETECTED — AB
Neisseria meningitidis: NOT DETECTED
PROTEUS SPECIES: NOT DETECTED
Pseudomonas aeruginosa: NOT DETECTED
STAPHYLOCOCCUS SPECIES: DETECTED — AB
STREPTOCOCCUS AGALACTIAE: NOT DETECTED
STREPTOCOCCUS PNEUMONIAE: NOT DETECTED
Serratia marcescens: NOT DETECTED
Staphylococcus aureus (BCID): NOT DETECTED
Streptococcus pyogenes: NOT DETECTED
Streptococcus species: NOT DETECTED

## 2017-04-29 LAB — CBC
HEMATOCRIT: 37.3 % (ref 36.0–46.0)
Hemoglobin: 11.4 g/dL — ABNORMAL LOW (ref 12.0–15.0)
MCH: 28.4 pg (ref 26.0–34.0)
MCHC: 30.6 g/dL (ref 30.0–36.0)
MCV: 92.8 fL (ref 78.0–100.0)
PLATELETS: 188 10*3/uL (ref 150–400)
RBC: 4.02 MIL/uL (ref 3.87–5.11)
RDW: 15.9 % — ABNORMAL HIGH (ref 11.5–15.5)
WBC: 21.3 10*3/uL — AB (ref 4.0–10.5)

## 2017-04-29 LAB — I-STAT ARTERIAL BLOOD GAS, ED
ACID-BASE EXCESS: 5 mmol/L — AB (ref 0.0–2.0)
BICARBONATE: 32.7 mmol/L — AB (ref 20.0–28.0)
O2 Saturation: 96 %
PH ART: 7.345 — AB (ref 7.350–7.450)
TCO2: 35 mmol/L (ref 0–100)
pCO2 arterial: 60 mmHg — ABNORMAL HIGH (ref 32.0–48.0)
pO2, Arterial: 86 mmHg (ref 83.0–108.0)

## 2017-04-29 LAB — BLOOD GAS, ARTERIAL
Acid-Base Excess: 6.5 mmol/L — ABNORMAL HIGH (ref 0.0–2.0)
Bicarbonate: 31.7 mmol/L — ABNORMAL HIGH (ref 20.0–28.0)
DELIVERY SYSTEMS: POSITIVE
DRAWN BY: 105521
Expiratory PAP: 4
FIO2: 40
INSPIRATORY PAP: 18
O2 Saturation: 96.8 %
PCO2 ART: 57 mmHg — AB (ref 32.0–48.0)
Patient temperature: 98.6
pH, Arterial: 7.364 (ref 7.350–7.450)
pO2, Arterial: 87.7 mmHg (ref 83.0–108.0)

## 2017-04-29 LAB — BASIC METABOLIC PANEL
ANION GAP: 7 (ref 5–15)
BUN: 38 mg/dL — ABNORMAL HIGH (ref 6–20)
CO2: 29 mmol/L (ref 22–32)
Calcium: 8.6 mg/dL — ABNORMAL LOW (ref 8.9–10.3)
Chloride: 103 mmol/L (ref 101–111)
Creatinine, Ser: 1.85 mg/dL — ABNORMAL HIGH (ref 0.44–1.00)
GFR calc Af Amer: 30 mL/min — ABNORMAL LOW (ref >60)
GFR, EST NON AFRICAN AMERICAN: 26 mL/min — AB (ref >60)
Glucose, Bld: 215 mg/dL — ABNORMAL HIGH (ref 65–99)
POTASSIUM: 4.5 mmol/L (ref 3.5–5.1)
Sodium: 139 mmol/L (ref 135–145)

## 2017-04-29 LAB — MRSA PCR SCREENING: MRSA by PCR: POSITIVE — AB

## 2017-04-29 LAB — I-STAT TROPONIN, ED: Troponin i, poc: 0 ng/mL (ref 0.00–0.08)

## 2017-04-29 LAB — GLUCOSE, CAPILLARY
GLUCOSE-CAPILLARY: 173 mg/dL — AB (ref 65–99)
GLUCOSE-CAPILLARY: 151 mg/dL — AB (ref 65–99)
GLUCOSE-CAPILLARY: 81 mg/dL (ref 65–99)
Glucose-Capillary: 112 mg/dL — ABNORMAL HIGH (ref 65–99)

## 2017-04-29 LAB — PROCALCITONIN: Procalcitonin: 5.6 ng/mL

## 2017-04-29 LAB — LACTIC ACID, PLASMA: Lactic Acid, Venous: 1.5 mmol/L (ref 0.5–1.9)

## 2017-04-29 LAB — I-STAT CG4 LACTIC ACID, ED: LACTIC ACID, VENOUS: 1.95 mmol/L — AB (ref 0.5–1.9)

## 2017-04-29 MED ORDER — IPRATROPIUM-ALBUTEROL 0.5-2.5 (3) MG/3ML IN SOLN: 3.0000 mL | RESPIRATORY_TRACT | Status: DC | PRN

## 2017-04-29 MED ORDER — ASPIRIN EC 81 MG PO TBEC
81.0000 mg | DELAYED_RELEASE_TABLET | Freq: Every day | ORAL | Status: DC
  Administered 2017-04-30 – 2017-05-03 (×4): 81 mg via ORAL
  Filled 2017-04-29 (×4): qty 1

## 2017-04-29 MED ORDER — ONDANSETRON HCL 4 MG/2ML IJ SOLN: 4.0000 mg | Freq: Four times a day (QID) | INTRAMUSCULAR | Status: DC | PRN

## 2017-04-29 MED ORDER — CHLORHEXIDINE GLUCONATE CLOTH 2 % EX PADS
6.0000 | MEDICATED_PAD | Freq: Every day | CUTANEOUS | Status: AC
  Administered 2017-04-29 – 2017-05-03 (×5): 6 via TOPICAL

## 2017-04-29 MED ORDER — MUPIROCIN 2 % EX OINT
TOPICAL_OINTMENT | CUTANEOUS | Status: AC
  Administered 2017-04-29: 09:00:00
  Filled 2017-04-29: qty 22

## 2017-04-29 MED ORDER — ACETAMINOPHEN 650 MG RE SUPP
650.0000 mg | Freq: Four times a day (QID) | RECTAL | Status: DC | PRN
  Administered 2017-04-29: 650 mg via RECTAL
  Filled 2017-04-29: qty 1

## 2017-04-29 MED ORDER — CETYLPYRIDINIUM CHLORIDE 0.05 % MT LIQD: OROMUCOSAL | Status: DC | PRN

## 2017-04-29 MED ORDER — ONDANSETRON HCL 4 MG PO TABS: 4.0000 mg | ORAL_TABLET | Freq: Four times a day (QID) | ORAL | Status: DC | PRN

## 2017-04-29 MED ORDER — ACETAMINOPHEN 325 MG PO TABS
650.0000 mg | ORAL_TABLET | Freq: Four times a day (QID) | ORAL | Status: DC | PRN
  Administered 2017-04-29 – 2017-05-03 (×4): 650 mg via ORAL
  Filled 2017-04-29 (×5): qty 2

## 2017-04-29 MED ORDER — ATORVASTATIN CALCIUM 10 MG PO TABS
10.0000 mg | ORAL_TABLET | Freq: Every evening | ORAL | Status: DC
  Administered 2017-04-29 – 2017-05-02 (×4): 10 mg via ORAL
  Filled 2017-04-29 (×4): qty 1

## 2017-04-29 MED ORDER — FLUTICASONE PROPIONATE 50 MCG/ACT NA SUSP
2.0000 | Freq: Every day | NASAL | Status: DC
  Administered 2017-04-29 – 2017-05-01 (×3): 2 via NASAL
  Filled 2017-04-29 (×2): qty 16

## 2017-04-29 MED ORDER — MUPIROCIN 2 % EX OINT
1.0000 "application " | TOPICAL_OINTMENT | Freq: Two times a day (BID) | CUTANEOUS | Status: DC
  Administered 2017-04-29 – 2017-05-03 (×8): 1 via NASAL
  Filled 2017-04-29 (×2): qty 22

## 2017-04-29 MED ORDER — SODIUM CHLORIDE 0.9 % IV SOLN
INTRAVENOUS | Status: DC
  Administered 2017-04-29 – 2017-05-02 (×5): via INTRAVENOUS

## 2017-04-29 MED ORDER — TORSEMIDE 20 MG PO TABS
30.0000 mg | ORAL_TABLET | Freq: Every day | ORAL | Status: DC
  Filled 2017-04-29: qty 1

## 2017-04-29 MED ORDER — VITAMIN C 500 MG PO TABS
500.0000 mg | ORAL_TABLET | Freq: Two times a day (BID) | ORAL | Status: DC
  Administered 2017-04-29 – 2017-05-03 (×8): 500 mg via ORAL
  Filled 2017-04-29 (×8): qty 1

## 2017-04-29 MED ORDER — INSULIN ASPART 100 UNIT/ML ~~LOC~~ SOLN
0.0000 [IU] | SUBCUTANEOUS | Status: DC
  Administered 2017-04-29 (×2): 3 [IU] via SUBCUTANEOUS
  Administered 2017-04-30: 5 [IU] via SUBCUTANEOUS
  Administered 2017-05-01: 3 [IU] via SUBCUTANEOUS
  Administered 2017-05-01: 5 [IU] via SUBCUTANEOUS
  Administered 2017-05-01: 2 [IU] via SUBCUTANEOUS
  Administered 2017-05-01 (×2): 3 [IU] via SUBCUTANEOUS
  Administered 2017-05-01 – 2017-05-02 (×2): 2 [IU] via SUBCUTANEOUS
  Administered 2017-05-02: 5 [IU] via SUBCUTANEOUS
  Administered 2017-05-02 – 2017-05-03 (×5): 3 [IU] via SUBCUTANEOUS
  Administered 2017-05-03: 2 [IU] via SUBCUTANEOUS
  Administered 2017-05-03: 3 [IU] via SUBCUTANEOUS
  Administered 2017-05-03: 2 [IU] via SUBCUTANEOUS

## 2017-04-29 MED ORDER — ENOXAPARIN SODIUM 40 MG/0.4ML ~~LOC~~ SOLN
40.0000 mg | SUBCUTANEOUS | Status: DC
  Administered 2017-04-29: 40 mg via SUBCUTANEOUS
  Filled 2017-04-29: qty 0.4

## 2017-04-29 MED ORDER — SODIUM CHLORIDE 0.9 % IV BOLUS (SEPSIS)
500.0000 mL | Freq: Once | INTRAVENOUS | Status: AC
  Administered 2017-04-29: 500 mL via INTRAVENOUS

## 2017-04-29 MED ORDER — SODIUM CHLORIDE 0.9 % IV BOLUS (SEPSIS)
1000.0000 mL | Freq: Once | INTRAVENOUS | Status: AC
  Administered 2017-04-29: 1000 mL via INTRAVENOUS
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
## 2017-04-29 NOTE — Progress Notes (Signed)
RT placed patient on BIPAP HS. Patient is tolerating well at this time.  

## 2017-04-29 NOTE — ED Notes (Signed)
Admitting MD at bedside.

## 2017-04-29 NOTE — Progress Notes (Signed)
PCCM ordered 1 L bolus and to keep MAP >65. MD paged regarding BP of 93/40 (56). Additional 1L bolus order given and to contact PCCM if no BP improvement. RN will continue to monitor.
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
## 2017-04-29 NOTE — Plan of Care (Signed)
Problem: Respiratory: Goal: Ability to maintain adequate ventilation will improve Outcome: Progressing Discussed with patient about wearing bipap and mouth care during her nothing by mouth diet order with some teach back displayed

## 2017-04-29 NOTE — NC FL2 (Signed)
[place] LEVEL OF CARE SCREENING TOOL     IDENTIFICATION  Patient Name: Heather Zimmerman Birthdate: 1946/01/31 Sex: female Admission Date (Current Location): 04/28/2017  [facility] and [place] Number:  Herbalist and Address:  The . [facility], [place] [address] [place], [place] [address]      Provider Number: [id number]  Attending Physician Name and Address:  Caren Griffins, MD  Relative Name and Phone Number:  Brayton Layman niece, [phone]    Current Level of Care: Hospital Recommended Level of Care: [place] Prior Approval Number:    Date Approved/Denied:   PASRR Number: [id number]  Discharge Plan: SNF    Current Diagnoses: Patient Active Problem List   Diagnosis Date Noted  . Sepsis secondary to UTI ([place]) 04/29/2017  . Acute on chronic respiratory failure with hypoxia and hypercapnia ([place]) 04/29/2017  . Acute metabolic encephalopathy 96/78/9381  . Dyslipidemia associated with type 2 diabetes mellitus ([place]) 03/03/2017  . Positive D dimer   . Altered mental status 12/28/2016  . Acute renal failure superimposed on stage 3 chronic kidney disease ([place])   . Cellulitis of left thigh 09/23/2016  . UTI (urinary tract infection) 09/23/2016  . Oropharyngeal dysphagia   . Coag negative Staphylococcus bacteremia   . Chronic venous stasis dermatitis   . Weakness of right upper extremity 07/09/2016  . Type II diabetes mellitus with neurological manifestations, uncontrolled ([place]) 04/23/2016  . Ventral hernia without obstruction or gangrene 02/24/2016  . Chronic respiratory failure with hypercapnia ([place]) 02/24/2016  . Occult blood in stools 12/26/2015  . Hypoxemia   . AKI (acute kidney injury) ([place])   . 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 CKD (chronic kidney disease) stage 3, GFR 30-59 ml/min 07/21/2015  . Cellulitis of left lower extremity 07/19/2015   . Open wound of left lower extremity 07/18/2015  . Sepsis ([place]) 07/18/2015  . Venous ulcer of left leg ([place]) 12/27/2014  . Hx of right BKA ([place]) 11/22/2014  . Peripheral autonomic neuropathy due to diabetes mellitus ([place]) 10/09/2014  . OSA treated with BiPAP 07/05/2014  . Acute on chronic respiratory failure with hypercapnia ([place]) 06/21/2014  . Morbid obesity ([place]) 08/30/2013  . Unspecified vitamin D deficiency 12/31/2012  . Disorder of magnesium metabolism 12/31/2012  . Depression 12/31/2012  . Chronic pain 12/31/2012  . Chronic diastolic heart failure ([place]) 12/31/2012  . GERD 12/31/2012    Orientation RESPIRATION BLADDER Height & Weight     Self, Situation, Place  O2 (Bipap) Incontinent, Indwelling catheter Weight: 135.9 kg (299 lb 9.7 oz) Height:  4\' 9"  (144.8 cm)  BEHAVIORAL SYMPTOMS/MOOD NEUROLOGICAL BOWEL NUTRITION STATUS      Continent Diet (Please see DC Summary)  AMBULATORY STATUS COMMUNICATION OF NEEDS Skin   Extensive Assist Verbally Other (Comment) (Venus stasis ulcer on leg;)                       Personal Care Assistance Level of Assistance  Bathing, Feeding, Dressing Bathing Assistance: Maximum assistance Feeding assistance: Limited assistance Dressing Assistance: Limited assistance     Functional Limitations Info             SPECIAL CARE FACTORS FREQUENCY                       Contractures      Additional Factors Info  Code Status, Allergies, Isolation Precautions Code Status  Info: Full Allergies Info:  Ace Inhibitors     Isolation Precautions Info: MRSA     Current Medications (04/29/2017):  This is the current hospital active medication list Current Facility-Administered Medications  Medication Dose Route Frequency Provider Last Rate Last Dose  . 0.9 %  sodium chloride infusion   Intravenous Continuous Caren Griffins, MD 75 mL/hr at 04/29/17 0230    . acetaminophen (TYLENOL) tablet 650 mg  650 mg Oral Q6H PRN Fuller Plan  A, MD       Or  . acetaminophen (TYLENOL) suppository 650 mg  650 mg Rectal Q6H PRN Fuller Plan A, MD   650 mg at 04/29/17 1212  . antiseptic oral rinse (CPC / CETYLPYRIDINIUM CHLORIDE 0.05%) solution   Mouth Rinse Q1H PRN Fuller Plan A, MD      . aspirin EC tablet 81 mg  81 mg Oral Daily Smith, Rondell A, MD      . atorvastatin (LIPITOR) tablet 10 mg  10 mg Oral QPM Smith, Rondell A, MD      . Chlorhexidine Gluconate Cloth 2 % PADS 6 each  6 each Topical Q0600 Norval Morton, MD   6 each at 04/29/17 0800  . enoxaparin (LOVENOX) injection 40 mg  40 mg Subcutaneous Q24H Smith, Rondell A, MD   40 mg at 04/29/17 1447  . fluticasone (FLONASE) 50 MCG/ACT nasal spray 2 spray  2 spray Each Nare QHS Smith, Rondell A, MD      . insulin aspart (novoLOG) injection 0-15 Units  0-15 Units Subcutaneous Q4H Fuller Plan A, MD   3 Units at 04/29/17 1213  . ipratropium-albuterol (DUONEB) 0.5-2.5 (3) MG/3ML nebulizer solution 3 mL  3 mL Nebulization Q4H PRN Smith, Rondell A, MD      . mupirocin ointment (BACTROBAN) 2 % 1 application  1 application Nasal BID Smith, Rondell A, MD      . ondansetron (ZOFRAN) tablet 4 mg  4 mg Oral Q6H PRN Fuller Plan A, MD       Or  . ondansetron (ZOFRAN) injection 4 mg  4 mg Intravenous Q6H PRN Smith, Rondell A, MD      . piperacillin-tazobactam (ZOSYN) IVPB 3.375 g  3.375 g Intravenous Q8H Rumbarger, Rachel L, RPH 12.5 mL/hr at 04/29/17 1238 3.375 g at 04/29/17 1238  . vancomycin (VANCOCIN) 1,500 mg in sodium chloride 0.9 % 500 mL IVPB  1,500 mg Intravenous Q24H Rumbarger, Valeda Malm, RPH      . vitamin C (ASCORBIC ACID) tablet 500 mg  500 mg Oral BID Norval Morton, MD         Discharge Medications: Please see discharge summary for a list of discharge medications.  Relevant Imaging Results:  Relevant Lab Results:   Additional Information SS#: [id number]  Benard Halsted, LCSWA

## 2017-04-29 NOTE — Care Management Note (Signed)
Case Management Note  Patient Details  Name: Heather Zimmerman MRN: [id number] Date of Birth: 1946/06/01  Subjective/Objective:  From [facility], presents with Severe Sepsis secondary to UTI, acute encephalopathy,  acute on chronic resp failure, AKI, chf,  required bipap.                 Action/Plan: NCM will follow along with CSW for dc needs.   Expected Discharge Date:  05/02/17               Expected Discharge Plan:  Skilled Nursing Facility  In-House Referral:  Clinical Social Work  Discharge planning Services  CM Consult  Post Acute Care Choice:    Choice offered to:     DME Arranged:    DME Agency:     HH Arranged:    [place] Agency:     Status of Service:  Completed, signed off  If discussed at [employer], dates discussed:    Additional Comments:  Zenon Mayo, RN 04/29/2017, 4:21 PM

## 2017-04-29 NOTE — ED Notes (Signed)
Portable US at bedside.

## 2017-04-29 NOTE — Progress Notes (Signed)
PHARMACY - PHYSICIAN COMMUNICATION CRITICAL VALUE ALERT - BLOOD CULTURE IDENTIFICATION (BCID)  Results for orders placed or performed during the hospital encounter of 04/28/17  Blood Culture ID Panel (Reflexed) (Collected: 04/28/2017  9:24 PM)  Result Value Ref Range   Enterococcus species NOT DETECTED NOT DETECTED   Listeria monocytogenes NOT DETECTED NOT DETECTED   Staphylococcus species DETECTED (A) NOT DETECTED   Staphylococcus aureus NOT DETECTED NOT DETECTED   Methicillin resistance DETECTED (A) NOT DETECTED   Streptococcus species NOT DETECTED NOT DETECTED   Streptococcus agalactiae NOT DETECTED NOT DETECTED   Streptococcus pneumoniae NOT DETECTED NOT DETECTED   Streptococcus pyogenes NOT DETECTED NOT DETECTED   Acinetobacter baumannii NOT DETECTED NOT DETECTED   Enterobacteriaceae species NOT DETECTED NOT DETECTED   Enterobacter cloacae complex NOT DETECTED NOT DETECTED   Escherichia coli NOT DETECTED NOT DETECTED   Klebsiella oxytoca NOT DETECTED NOT DETECTED   Klebsiella pneumoniae NOT DETECTED NOT DETECTED   Proteus species NOT DETECTED NOT DETECTED   Serratia marcescens NOT DETECTED NOT DETECTED   Haemophilus influenzae NOT DETECTED NOT DETECTED   Neisseria meningitidis NOT DETECTED NOT DETECTED   Pseudomonas aeruginosa NOT DETECTED NOT DETECTED   Candida albicans NOT DETECTED NOT DETECTED   Candida glabrata NOT DETECTED NOT DETECTED   Candida krusei NOT DETECTED NOT DETECTED   Candida parapsilosis NOT DETECTED NOT DETECTED   Candida tropicalis NOT DETECTED NOT DETECTED    7/16 blood cultures: GPC/clusters 1 of 2. Staph species, methicillin resistant. Possible contaminant  Name of physician (or Provider) Contacted: Lamar Blinks NP  Changes to prescribed antibiotics required: No changes recommended  Hildred Laser, Pharm D 04/29/2017 7:05 PM

## 2017-04-29 NOTE — Progress Notes (Signed)
Md paged regard pt's BP of 80/43. Orders for 500 cc bolus given. RN will continue to monitor.

## 2017-04-29 NOTE — Progress Notes (Signed)
eLink Physician-Brief Progress Note Patient Name: Heather Zimmerman. Rudden DOB: 12/01/1945 MRN: [id number]   Date of Service  04/29/2017  HPI/Events of Note  Asked to assess low BP She is morbidity obese, likely inaccurate BP, on leg cuff etc Last lactic wnl She is awake, O x 3, she has adequate urine output Her last lactic was normal  pcxr without overload,  But has atx Last abg ph WNL  eICU Interventions  Lactic repeat Bolus again She seems to be perfusing now, lactic may help     Intervention Category Major Interventions: Hypotension - evaluation and management  Quintan Saldivar J. 04/29/2017, 11:16 PM

## 2017-04-29 NOTE — Consult Note (Signed)
[place] Nurse wound consult note Reason for Consult: LLE wound Wound type: LLE venous stasis ulceration with evidence of significant healing of the LE. AKA on the right Patient is not able to answer questions about current POC for LLE Pressure Injury POA: No Measurement: Left lateral: 2cm x 1.5cm x 0.2cm; 100% yellow Left lateral: 1cm x 1.5cm x 0.2cm: 100% yellow Left posterior: 1cm x 1cm x 0.1cm; 100% red Left posterior: 0.5cmx 0.5cm x 0.1cm:100% red  Wound bed: see above Drainage (amount, consistency, odor) minimal, non-purlent Periwound: intact, evidence of large area of circumferential re-epithelialization  Dressing procedure/placement/frequency: Silver hydrofiber for exudate management, cover with foam. Change every other day. Wrap with kerlix and ACE wrap for light compression from first metatarsal head to the patellar notch.  Once the patient can answer questions or family in the room she can be converted to stronger compression if that is what she was using PTA. Re consult if needed, will not follow at this time. Thanks  Kourtni Stineman [employer], RN,CWOCN, CNS, [place] [phone])

## 2017-04-29 NOTE — ED Notes (Signed)
No fluids started per MD.

## 2017-04-29 NOTE — Progress Notes (Signed)
Patient seen and examined this morning, admitted overnight by Dr. Tamala Julian, H&P reviewed and agree with the assessment and plan  In brief, this is a 71 year old female with morbid obesity, chronic hypoxic and hypercarbic respiratory failure, chronic diastolic CHF, obstructive sleep apnea noncompliant with BiPAP, being brought to the emergency room from [facility] rehab due to lethargy.  She was found to be hypoxic and hypercarbic requiring BiPAP and was admitted to stepdown.  She was also found to be septic, was started on broad-spectrum antibiotics for presumed urinary tract infection.   Sepsis due to UTI -Started on broad-spectrum antibiotics, continue, cultures were sent  Acute on chronic hypoxic and hypercarbic respiratory failure -Continue BiPAP, ABG with some improvement this morning, she still lethargic however improving  Acute encephalopathy -Due to #1 and #2, treat underlying issue and continue to monitor  Acute kidney injury -Continue IV fluids, recheck renal function tomorrow morning  Diastolic CHF -Closely monitor respiratory status given fluids in the setting of sepsis and renal failure, she required intubation in October 2017.  Left lower extremity wound -Consult wound care  Type 2 diabetes mellitus -Continue sliding scale   Abdelrahman Nair M. Cruzita Lederer, MD [facility] [phone]  If 7PM-7AM, please contact night-coverage [email or web address] Password [email or web address]

## 2017-04-30 ENCOUNTER — Inpatient Hospital Stay (HOSPITAL_COMMUNITY): Payer: Medicare Other

## 2017-04-30 DIAGNOSIS — R0689 Other abnormalities of breathing: Secondary | ICD-10-CM

## 2017-04-30 DIAGNOSIS — I5032 Chronic diastolic (congestive) heart failure: Secondary | ICD-10-CM

## 2017-04-30 DIAGNOSIS — R0902 Hypoxemia: Secondary | ICD-10-CM

## 2017-04-30 LAB — BASIC METABOLIC PANEL
ANION GAP: 7 (ref 5–15)
BUN: 34 mg/dL — AB (ref 6–20)
CALCIUM: 8.1 mg/dL — AB (ref 8.9–10.3)
CO2: 29 mmol/L (ref 22–32)
Chloride: 111 mmol/L (ref 101–111)
Creatinine, Ser: 1.87 mg/dL — ABNORMAL HIGH (ref 0.44–1.00)
GFR calc Af Amer: 30 mL/min — ABNORMAL LOW (ref >60)
GFR calc non Af Amer: 26 mL/min — ABNORMAL LOW (ref >60)
GLUCOSE: 83 mg/dL (ref 65–99)
Potassium: 3.9 mmol/L (ref 3.5–5.1)
Sodium: 147 mmol/L — ABNORMAL HIGH (ref 135–145)

## 2017-04-30 LAB — BLOOD GAS, ARTERIAL
Acid-Base Excess: 2 mmol/L (ref 0.0–2.0)
Bicarbonate: 26.8 mmol/L (ref 20.0–28.0)
DRAWN BY: 331001
FIO2: 28
O2 Saturation: 97.2 %
Patient temperature: 99.2
pCO2 arterial: 48.2 mmHg — ABNORMAL HIGH (ref 32.0–48.0)
pH, Arterial: 7.365 (ref 7.350–7.450)
pO2, Arterial: 89.8 mmHg (ref 83.0–108.0)

## 2017-04-30 LAB — CBC
HCT: 37.1 % (ref 36.0–46.0)
HEMOGLOBIN: 11 g/dL — AB (ref 12.0–15.0)
MCH: 28 pg (ref 26.0–34.0)
MCHC: 29.6 g/dL — AB (ref 30.0–36.0)
MCV: 94.4 fL (ref 78.0–100.0)
PLATELETS: 158 10*3/uL (ref 150–400)
RBC: 3.93 MIL/uL (ref 3.87–5.11)
RDW: 16.9 % — AB (ref 11.5–15.5)
WBC: 12.6 10*3/uL — ABNORMAL HIGH (ref 4.0–10.5)

## 2017-04-30 LAB — GLUCOSE, CAPILLARY
GLUCOSE-CAPILLARY: 106 mg/dL — AB (ref 65–99)
GLUCOSE-CAPILLARY: 84 mg/dL (ref 65–99)
Glucose-Capillary: 60 mg/dL — ABNORMAL LOW (ref 65–99)
Glucose-Capillary: 76 mg/dL (ref 65–99)
Glucose-Capillary: 69 mg/dL (ref 65–99)
Glucose-Capillary: 112 mg/dL — ABNORMAL HIGH (ref 65–99)
Glucose-Capillary: 250 mg/dL — ABNORMAL HIGH (ref 65–99)

## 2017-04-30 LAB — LACTIC ACID, PLASMA: LACTIC ACID, VENOUS: 0.9 mmol/L (ref 0.5–1.9)

## 2017-04-30 MED ORDER — ENOXAPARIN SODIUM 30 MG/0.3ML ~~LOC~~ SOLN
30.0000 mg | SUBCUTANEOUS | Status: DC
  Administered 2017-04-30 – 2017-05-01 (×2): 30 mg via SUBCUTANEOUS
  Filled 2017-04-30 (×2): qty 0.3

## 2017-04-30 MED ORDER — ALUM & MAG HYDROXIDE-SIMETH 200-200-20 MG/5ML PO SUSP
30.0000 mL | ORAL | Status: DC | PRN
  Administered 2017-04-30 – 2017-05-01 (×2): 30 mL via ORAL
  Filled 2017-04-30 (×3): qty 30

## 2017-04-30 MED ORDER — DEXTROSE 50 % IV SOLN
1.0000 | Freq: Once | INTRAVENOUS | Status: AC
  Administered 2017-04-30: 50 mL via INTRAVENOUS
  Filled 2017-04-30: qty 50

## 2017-04-30 NOTE — Progress Notes (Signed)
This note also relates to the following rows which could not be included: CBG Lab Component - View only - Cannot attach notes to extension rows  CBG 60 - had dilution with alcohol swab

## 2017-04-30 NOTE — Progress Notes (Signed)
This note also relates to the following rows which could not be included: CBG Lab Component - View only - Cannot attach notes to extension rows  CBG 76 Accurate

## 2017-04-30 NOTE — Progress Notes (Signed)
PROGRESS NOTE    Heather Zimmerman. [name]  OIZ:[id number] DOB: 03-Jul-1946 DOA: 04/28/2017 PCP: Patient, No Pcp Per     Brief Narrative:   71 year old female presents to the hospital 04/29/17  with severe sepsis related to complicated UTI with possible cellulitis .   71 year old female with morbid obesity, chronic hypoxic and hypercarbic respiratory failure, chronic diastolic CHF, obstructive sleep apnea noncompliant with BiPAP, being brought to the emergency room from [facility] rehab due to lethargy.  She was found to be hypoxic and hypercarbic requiring BiPAP and was admitted to stepdown.  She was also found to be septic, was started on broad-spectrum antibiotics for presumed urinary tract infection.  Assessment & Plan:   Principal Problem:   Acute on chronic respiratory failure with hypoxia and hypercapnia (HCC) Active Problems:   Chronic diastolic heart failure (HCC)   OSA treated with BiPAP   Open wound of left lower extremity   AKI (acute kidney injury) ([place])   Type II diabetes mellitus with neurological manifestations, uncontrolled ([place])   Sepsis secondary to UTI ([place])   Acute metabolic encephalopathy  Sepsis due to UTI : improving . -Started on broad-spectrum antibiotics zosyn/Vanc, continue, cultures were sent , pending .  Acute on chronic hypoxic and hypercarbic respiratory failure -improving , repeat the ABG and wean off the BIPAP .  Acute encephalopathy -Due to #1 and #2, treat underlying issue and continue to monitor  Acute kidney injury -Continue IV fluids,minimal improvement , possible underlying CKD, Monitor the volume status not to provoke CHF .  Diastolic CHF -Closely monitor respiratory status given fluids in the setting of sepsis and renal failure, she required intubation in October 2017.  Left lower extremity wound -Consult wound care , Less likely cellulitis , will wait for the final Cx before deescalating .  Type 2 diabetes mellitus -Continue  sliding scale    DVT prophylaxis: (Lovenox) Code Status: (Full)  Family Communication: (NONE )  Disposition Plan: SNIF     Consultants:   CC .  Procedures:  Antimicrobials: (specify start and planned stop date. Auto populated tables are space occupying and do not give end dates)  Zosyn/Vanc day#2 .   Subjective:  Sleepy on BiPAP this morning with a borderline blood pressure , awakens easily and responds to the command appropriately she is denying any complaints except of lower extremity pain, denies any fevers or chills or nausea or vomiting .  Objective: Vitals:   04/30/17 0730 04/30/17 0936 04/30/17 0939 04/30/17 1136  BP: 91/78 (!) 92/49 (!) 92/49 98/60  Pulse: 82 91 91 82  Resp: 17 19 18  (!) 22  Temp:    99.2 F (37.3 C)  TempSrc:    Oral  SpO2: 100% 100% 100% 98%  Weight:      Height:        Intake/Output Summary (Last 24 hours) at 04/30/17 1151 Last data filed at 04/30/17 1100  Gross per 24 hour  Intake          6319.17 ml  Output             1450 ml  Net          4869.17 ml   Filed Weights   04/28/17 2100 04/29/17 0500 04/30/17 0500  Weight: 127 kg (280 lb) 135.9 kg (299 lb 9.7 oz) (!) 139.7 kg (307 lb 15.7 oz)    Examination:  General exam: NAD. Respiratory system: Clear to auscultation. Respiratory effort normal. Cardiovascular system: RRR,S1S2. Gastrointestinal system: Abdomen is nondistended,  soft and nontender. No organomegaly or masses felt. Normal bowel sounds heard. Central nervous system: non focal . Extremities: Symmetric 5 x 5 power. Skin: No rashes,Lower ext stasis with ulcers.  Psychiatry: Judgement and insight appear normal. Mood & affect appropriate.     Data Reviewed: I have personally reviewed following labs and imaging studies  CBC:  Recent Labs Lab 04/28/17 2125 04/29/17 0236 04/30/17 0525  WBC 23.1* 21.3* 12.6*  NEUTROABS 20.1*  --   --   HGB 9.3* 11.4* 11.0*  HCT 30.3* 37.3 37.1  MCV 92.1 92.8 94.4  PLT 295  188 829   Basic Metabolic Panel:  Recent Labs Lab 04/28/17 2125 04/29/17 0236 04/30/17 0525  NA 139 139 147*  K 4.3 4.5 3.9  CL 100* 103 111  CO2 30 29 29   GLUCOSE 189* 215* 83  BUN 37* 38* 34*  CREATININE 1.60* 1.85* 1.87*  CALCIUM 9.2 8.6* 8.1*   GFR: Estimated Creatinine Clearance: 33.9 mL/min (A) (by C-G formula based on SCr of 1.87 mg/dL (H)). Liver Function Tests:  Recent Labs Lab 04/28/17 2125  AST 28  ALT 25  ALKPHOS 101  BILITOT 1.0  PROT 8.9*  ALBUMIN 3.3*    Recent Labs Lab 04/28/17 2125  LIPASE 21    Recent Labs Lab 04/28/17 2125  AMMONIA 20   Coagulation Profile:  Recent Labs Lab 04/28/17 2125  INR 1.09   Cardiac Enzymes: No results for input(s): CKTOTAL, CKMB, CKMBINDEX, TROPONINI in the last 168 hours. BNP (last 3 results) No results for input(s): PROBNP in the last 8760 hours. HbA1C: No results for input(s): HGBA1C in the last 72 hours. CBG:  Recent Labs Lab 04/29/17 2018 04/30/17 0006 04/30/17 0523 04/30/17 0529 04/30/17 0732  GLUCAP 81 84 60* 76 69   Lipid Profile: No results for input(s): CHOL, HDL, LDLCALC, TRIG, CHOLHDL, LDLDIRECT in the last 72 hours. Thyroid Function Tests: No results for input(s): TSH, T4TOTAL, FREET4, T3FREE, THYROIDAB in the last 72 hours. Anemia Panel: No results for input(s): VITAMINB12, FOLATE, FERRITIN, TIBC, IRON, RETICCTPCT in the last 72 hours. Urine analysis:    Component Value Date/Time   COLORURINE YELLOW 04/28/2017 2305   APPEARANCEUR HAZY (A) 04/28/2017 2305   LABSPEC 1.005 04/28/2017 2305   PHURINE 5.0 04/28/2017 2305   GLUCOSEU NEGATIVE 04/28/2017 2305   HGBUR NEGATIVE 04/28/2017 2305   BILIRUBINUR NEGATIVE 04/28/2017 2305   KETONESUR NEGATIVE 04/28/2017 2305   PROTEINUR 30 (A) 04/28/2017 2305   UROBILINOGEN 0.2 07/18/2015 0609   NITRITE NEGATIVE 04/28/2017 2305   LEUKOCYTESUR LARGE (A) 04/28/2017 2305   Sepsis Labs: @LABRCNTIP (procalcitonin:4,lacticidven:4)  ) Recent  Results (from the past 240 hour(s))  Blood Culture (routine x 2)     Status: None (Preliminary result)   Collection Time: 04/28/17  9:20 PM  Result Value Ref Range Status   Specimen Description BLOOD RIGHT ARM  Final   Special Requests   Final    BOTTLES DRAWN AEROBIC AND ANAEROBIC Blood Culture adequate volume   Culture NO GROWTH < 24 HOURS  Final   Report Status PENDING  Incomplete  Blood Culture (routine x 2)     Status: Abnormal (Preliminary result)   Collection Time: 04/28/17  9:24 PM  Result Value Ref Range Status   Specimen Description BLOOD LEFT ANTECUBITAL  Final   Special Requests   Final    BOTTLES DRAWN AEROBIC AND ANAEROBIC Blood Culture adequate volume   Culture  Setup Time   Final    GRAM POSITIVE COCCI IN  CLUSTERS IN BOTH AEROBIC AND ANAEROBIC BOTTLES CRITICAL RESULT CALLED TO, READ BACK BY AND VERIFIED WITH: A MAYER PHARMD 1841 04/29/17 A BROWNING    Culture STAPHYLOCOCCUS SPECIES (COAGULASE NEGATIVE) (A)  Final   Report Status PENDING  Incomplete  Blood Culture ID Panel (Reflexed)     Status: Abnormal   Collection Time: 04/28/17  9:24 PM  Result Value Ref Range Status   Enterococcus species NOT DETECTED NOT DETECTED Final   Listeria monocytogenes NOT DETECTED NOT DETECTED Final   Staphylococcus species DETECTED (A) NOT DETECTED Final    Comment: Methicillin (oxacillin) resistant coagulase negative staphylococcus. Possible blood culture contaminant (unless isolated from more than one blood culture draw or clinical case suggests pathogenicity). No antibiotic treatment is indicated for blood  culture contaminants. CRITICAL RESULT CALLED TO, READ BACK BY AND VERIFIED WITH: A MAYER PHARMD 1841 04/29/17 A BROWNING    Staphylococcus aureus NOT DETECTED NOT DETECTED Final   Methicillin resistance DETECTED (A) NOT DETECTED Final    Comment: CRITICAL RESULT CALLED TO, READ BACK BY AND VERIFIED WITH: A MAYER PHARMD 1841 04/29/17 A BROWNING    Streptococcus species NOT  DETECTED NOT DETECTED Final   Streptococcus agalactiae NOT DETECTED NOT DETECTED Final   Streptococcus pneumoniae NOT DETECTED NOT DETECTED Final   Streptococcus pyogenes NOT DETECTED NOT DETECTED Final   Acinetobacter baumannii NOT DETECTED NOT DETECTED Final   Enterobacteriaceae species NOT DETECTED NOT DETECTED Final   Enterobacter cloacae complex NOT DETECTED NOT DETECTED Final   Escherichia coli NOT DETECTED NOT DETECTED Final   Klebsiella oxytoca NOT DETECTED NOT DETECTED Final   Klebsiella pneumoniae NOT DETECTED NOT DETECTED Final   Proteus species NOT DETECTED NOT DETECTED Final   Serratia marcescens NOT DETECTED NOT DETECTED Final   Haemophilus influenzae NOT DETECTED NOT DETECTED Final   Neisseria meningitidis NOT DETECTED NOT DETECTED Final   Pseudomonas aeruginosa NOT DETECTED NOT DETECTED Final   Candida albicans NOT DETECTED NOT DETECTED Final   Candida glabrata NOT DETECTED NOT DETECTED Final   Candida krusei NOT DETECTED NOT DETECTED Final   Candida parapsilosis NOT DETECTED NOT DETECTED Final   Candida tropicalis NOT DETECTED NOT DETECTED Final  MRSA PCR Screening     Status: Abnormal   Collection Time: 04/29/17  4:56 AM  Result Value Ref Range Status   MRSA by PCR POSITIVE (A) NEGATIVE Final    Comment:        The GeneXpert MRSA Assay (FDA approved for NASAL specimens only), is one component of a comprehensive MRSA colonization surveillance program. It is not intended to diagnose MRSA infection nor to guide or monitor treatment for MRSA infections. RESULT CALLED TO, READ BACK BY AND VERIFIED WITHCandiss Norse RN AT [phone] ON [id number] BY SJW          Radiology Studies: US Renal  Result Date: 04/29/2017 CLINICAL DATA:  Acute kidney injury.  History of diabetes. EXAM: RENAL / URINARY TRACT ULTRASOUND COMPLETE COMPARISON:  Renal ultrasound October 04, 2015. FINDINGS: Per technologist note, habitus limited examination. Right Kidney: Length: 9.6 cm. Echogenicity  within normal limits. No mass or hydronephrosis visualized. 2.8 x 2.4 x 2.2 cm anechoic cyst lower pole. Left Kidney: Length: 9.5 cm. Echogenicity within normal limits. No mass or hydronephrosis visualized. Bladder: Decompressed. IMPRESSION: Habitus limited examination without obstructive uropathy. Re- demonstration of RIGHT renal cyst, measuring smaller today though, this may be technical. Electronically Signed   By: Elon Alas M.D.   On: 04/29/2017 03:34   Dg Chest  Port 1 View  Result Date: 04/28/2017 CLINICAL DATA:  71 year old female with shortness of breath and sepsis. EXAM: PORTABLE CHEST 1 VIEW COMPARISON:  Chest radiograph dated 12/29/2016 FINDINGS: There is shallow inspiration. Minimal bibasilar atelectatic changes noted. There is no focal consolidation, pleural effusion, or pneumothorax. The right upper lobe and right apical region is not well evaluated due to superimposition of the patient's jaw. Stable mild cardiomegaly. Minimal central vascular prominence may represent mild congestion. No acute osseous pathology. IMPRESSION: 1. Shallow inspiration with bibasilar atelectatic changes. 2. Stable mild cardiomegaly with probable mild vascular congestion. Electronically Signed   By: Anner Crete M.D.   On: 04/28/2017 21:13        Scheduled Meds: . aspirin EC  81 mg Oral Daily  . atorvastatin  10 mg Oral QPM  . Chlorhexidine Gluconate Cloth  6 each Topical Q0600  . enoxaparin (LOVENOX) injection  40 mg Subcutaneous Q24H  . fluticasone  2 spray Each Nare QHS  . insulin aspart  0-15 Units Subcutaneous Q4H  . mupirocin ointment  1 application Nasal BID  . vitamin C  500 mg Oral BID   Continuous Infusions: . sodium chloride 150 mL/hr at 04/30/17 0435  . piperacillin-tazobactam (ZOSYN)  IV 3.375 g (04/30/17 1140)  . vancomycin Stopped (04/30/17 0212)     LOS: 1 day    Time spent: 35 minutes     Waldron Session, MD [facility] Pager [phone]  If 7PM-7AM, please  contact night-coverage [email or web address] Password [facility] 04/30/2017, 11:51 AM

## 2017-04-30 NOTE — Progress Notes (Signed)
Name: Heather Zimmerman. Qin MRN: [id number] DOB: Nov 23, 1945    ADMISSION DATE:  04/28/2017 CONSULTATION DATE:  7/17  REFERRING MD :  Dr. Renne Crigler  CHIEF COMPLAINT:  "Chills"  HISTORY OF PRESENT ILLNESS:  71 year old female with PMH as below, which is significant for OSA noncompliant with CPAP, DM, CHF (LVEF 60-65, Grade 1 DD), and PVD with venous stasis breakdown to LLE with recent cellulitis. She was recently admitted for cellulitis of LLE which developed into septic shock requiring vasoactive agents. She was treated with 14 days of antibiotics and hemodynamic support. She was discharged to [facility] 01/03/17.  She  presented to [facility] from [facility] 7/17 early AM with complaints of fevers/chills and  hypoxemic requiring 4 L nasal cannula. She also was lethargic and on venous blood gas she was noted to have a respiratory acidosis and therefore was started on BiPAP.   SIGNIFICANT EVENTS  7/17 admit   STUDIES:  7/17 Renal US > Habitus limited examination without obstructive uropathy. Re- demonstration of RIGHT renal cyst, measuring smaller today though, this may be technical.    SUBJECTIVE:  Hypotensive overnight Off Bipap this am Afebrile Good UO  VITAL SIGNS: Temp:  [98.7 F (37.1 C)-101.1 F (38.4 C)] 99.2 F (37.3 C) (07/18 1136) Pulse Rate:  [75-102] 83 (07/18 1200) Resp:  [16-28] 21 (07/18 1200) BP: (78-124)/(28-100) 104/54 (07/18 1200) SpO2:  [95 %-100 %] 98 % (07/18 1200) Weight:  [307 lb 15.7 oz (139.7 kg)] 307 lb 15.7 oz (139.7 kg) (07/18 0500)  PHYSICAL EXAMINATION: General:  Morbidly obese elderly female in NAD Neuro:   alert and oriented x 3, interactive, non focal HEENT:  [place]/At, PERRL, no JVD Cardiovascular:  RRR. No MRG Lungs:  Clear,decreased BS  bilateral breath sounds. Effort wnl.  Abdomen:  Soft, non-tender, non-distended Musculoskeletal:  No acute deformity or ROM limitation Skin:  Two adjacent ulcerated areas to anterior surface of LLE. No erythema or  drainage.    Recent Labs Lab 04/28/17 2125 04/29/17 0236 04/30/17 0525  NA 139 139 147*  K 4.3 4.5 3.9  CL 100* 103 111  CO2 30 29 29   BUN 37* 38* 34*  CREATININE 1.60* 1.85* 1.87*  GLUCOSE 189* 215* 83    Recent Labs Lab 04/28/17 2125 04/29/17 0236 04/30/17 0525  HGB 9.3* 11.4* 11.0*  HCT 30.3* 37.3 37.1  WBC 23.1* 21.3* 12.6*  PLT 295 188 158   US Renal  Result Date: 04/29/2017 CLINICAL DATA:  Acute kidney injury.  History of diabetes. EXAM: RENAL / URINARY TRACT ULTRASOUND COMPLETE COMPARISON:  Renal ultrasound October 04, 2015. FINDINGS: Per technologist note, habitus limited examination. Right Kidney: Length: 9.6 cm. Echogenicity within normal limits. No mass or hydronephrosis visualized. 2.8 x 2.4 x 2.2 cm anechoic cyst lower pole. Left Kidney: Length: 9.5 cm. Echogenicity within normal limits. No mass or hydronephrosis visualized. Bladder: Decompressed. IMPRESSION: Habitus limited examination without obstructive uropathy. Re- demonstration of RIGHT renal cyst, measuring smaller today though, this may be technical. Electronically Signed   By: Elon Alas M.D.   On: 04/29/2017 03:34   Dg Chest Port 1 View  Result Date: 04/30/2017 CLINICAL DATA:  Shortness of breath EXAM: PORTABLE CHEST 1 VIEW COMPARISON:  04/28/2017 FINDINGS: Borderline cardiomegaly. Stable aortic and hilar contours; patient had mediastinal and hilar adenopathy on January 2018 chest CT. Low volume chest with chronic interstitial prominence. No Kerley lines, effusion, or air bronchogram. IMPRESSION: Stable low volume chest without acute finding. Electronically Signed  By: Monte Fantasia M.D.   On: 04/30/2017 12:56   Dg Chest Port 1 View  Result Date: 04/28/2017 CLINICAL DATA:  71 year old female with shortness of breath and sepsis. EXAM: PORTABLE CHEST 1 VIEW COMPARISON:  Chest radiograph dated 12/29/2016 FINDINGS: There is shallow inspiration. Minimal bibasilar atelectatic changes noted. There is no  focal consolidation, pleural effusion, or pneumothorax. The right upper lobe and right apical region is not well evaluated due to superimposition of the patient's jaw. Stable mild cardiomegaly. Minimal central vascular prominence may represent mild congestion. No acute osseous pathology. IMPRESSION: 1. Shallow inspiration with bibasilar atelectatic changes. 2. Stable mild cardiomegaly with probable mild vascular congestion. Electronically Signed   By: Anner Crete M.D.   On: 04/28/2017 21:13    ASSESSMENT / PLAN:  71 year old female with OSA (noncompliant CPAP), Grade 1 diastolic dysfunction, and DM who resides in SNF. Admitted for urosepsis and hypercapnic respiratory failure.   Severe sepsis: secondary to urinary source vs less likely cellulitis or PNA, Blood cx 1/2 coag neg staph - Continue empiric Zosyn, dc Vancomycin - Follow urine culture - Normal lactate is reassuring, accep SBP 90 & above - Wound care nurse following  Acute on chronic hypercarbic/hypoxemic respiratory failure: likely due to decompensated OHS in the setting of sepsis.  - BiPAP as needed during the day, mandatory at night  - Supplemental O2 2L (home flow rate)   AKI: CKD basline with serum creatinine 1.2 at time of last discharge. Avoid nephrotoxins  PCCm available as needed  Kara Mead MD. FCCP.  Pulmonary & Critical care Pager [phone] If no response call [phone]      04/30/2017 1:22 PM

## 2017-04-30 NOTE — Plan of Care (Signed)
Problem: Respiratory: Goal: Ability to maintain adequate ventilation will improve Outcome: Progressing Discussed with patient about wearing bipap tonight and helping with her respiratory and cardiac demands with some teach back displayed

## 2017-05-01 LAB — COMPREHENSIVE METABOLIC PANEL
ALK PHOS: 67 U/L (ref 38–126)
ALT: 22 U/L (ref 14–54)
ANION GAP: 4 — AB (ref 5–15)
AST: 34 U/L (ref 15–41)
Albumin: 2.1 g/dL — ABNORMAL LOW (ref 3.5–5.0)
BUN: 26 mg/dL — ABNORMAL HIGH (ref 6–20)
CALCIUM: 8.1 mg/dL — AB (ref 8.9–10.3)
CO2: 29 mmol/L (ref 22–32)
Chloride: 113 mmol/L — ABNORMAL HIGH (ref 101–111)
Creatinine, Ser: 1.62 mg/dL — ABNORMAL HIGH (ref 0.44–1.00)
GFR calc non Af Amer: 31 mL/min — ABNORMAL LOW (ref >60)
GFR, EST AFRICAN AMERICAN: 36 mL/min — AB (ref >60)
GLUCOSE: 148 mg/dL — AB (ref 65–99)
Potassium: 3.8 mmol/L (ref 3.5–5.1)
Sodium: 146 mmol/L — ABNORMAL HIGH (ref 135–145)
Total Bilirubin: 0.7 mg/dL (ref 0.3–1.2)
Total Protein: 6.6 g/dL (ref 6.5–8.1)

## 2017-05-01 LAB — CBC
HEMATOCRIT: 34.3 % — AB (ref 36.0–46.0)
HEMOGLOBIN: 10.3 g/dL — AB (ref 12.0–15.0)
MCH: 28 pg (ref 26.0–34.0)
MCHC: 30 g/dL (ref 30.0–36.0)
MCV: 93.2 fL (ref 78.0–100.0)
Platelets: 154 10*3/uL (ref 150–400)
RBC: 3.68 MIL/uL — ABNORMAL LOW (ref 3.87–5.11)
RDW: 16.3 % — ABNORMAL HIGH (ref 11.5–15.5)
WBC: 7.6 10*3/uL (ref 4.0–10.5)

## 2017-05-01 LAB — CULTURE, BLOOD (ROUTINE X 2): Special Requests: ADEQUATE

## 2017-05-01 LAB — GLUCOSE, CAPILLARY
GLUCOSE-CAPILLARY: 135 mg/dL — AB (ref 65–99)
GLUCOSE-CAPILLARY: 145 mg/dL — AB (ref 65–99)
GLUCOSE-CAPILLARY: 185 mg/dL — AB (ref 65–99)
GLUCOSE-CAPILLARY: 208 mg/dL — AB (ref 65–99)
Glucose-Capillary: 179 mg/dL — ABNORMAL HIGH (ref 65–99)
Glucose-Capillary: 199 mg/dL — ABNORMAL HIGH (ref 65–99)

## 2017-05-01 NOTE — Progress Notes (Signed)
PROGRESS NOTE    Heather Zimmerman. [name]  ZHG:[id number] DOB: [birth year] DOA: 04/28/2017 PCP: Patient, No Pcp Per     Brief Narrative:   71 year old female presents to the hospital 04/29/17  with severe sepsis related to complicated UTI with possible cellulitis .   71 year old female with morbid obesity, chronic hypoxic and hypercarbic respiratory failure, chronic diastolic CHF, obstructive sleep apnea noncompliant with BiPAP, being brought to the emergency room from [facility] rehab due to lethargy.  She was found to be hypoxic and hypercarbic requiring BiPAP and was admitted to stepdown.  She was also found to be septic, was started on broad-spectrum antibiotics for presumed urinary tract infection.  Assessment & Plan:   Principal Problem:   Acute on chronic respiratory failure with hypoxia and hypercapnia (HCC) Active Problems:   Chronic diastolic heart failure (HCC)   OSA treated with BiPAP   Open wound of left lower extremity   AKI (acute kidney injury) ([place])   Type II diabetes mellitus with neurological manifestations, uncontrolled ([place])   Sepsis secondary to UTI ([place])   Acute metabolic encephalopathy  Sepsis due to UTI : improving . -Started on broad-spectrum antibiotics zosyn/Vanc, deescalated to Zosyn per CC team , Cx is pending .  Acute on chronic hypoxic and hypercarbic respiratory failure -improved , BIPAP at bedtime and when napping .  Acute encephalopathy -Due to #1 and #2,resolved.  Acute kidney injury -decrease IV fluids,some improvement , possible underlying CKD .  Diastolic CHF -Closely monitor respiratory status given fluids in the setting of sepsis and renal failure, she required intubation in October 2017.  Left lower extremity wound -Consult wound care , Less likely cellulitis ,Vanc stopped today .  Type 2 diabetes mellitus -Continue sliding scale   TP TO THE FLOOR , START PT/OT .   DVT prophylaxis: (Lovenox) Code Status: (Full)  Family  Communication: (NONE )  Disposition Plan: SNIF     Consultants:   CC .  Procedures:  Antimicrobials: (specify start and planned stop date. Auto populated tables are space occupying and do not give end dates)  Zosyn/Vanc day#2 .   Subjective:  No issues , alert and awakened ,responds to the commands easily , denies any complaints other than chronic Lower ext pain .  Objective: Vitals:   05/01/17 0400 05/01/17 0500 05/01/17 0800 05/01/17 1211  BP: (!) 88/33 91/70 (!) 98/47 (!) 93/50  Pulse: 73 67 77 73  Resp: 19 13 18  (!) 21  Temp: 97.9 F (36.6 C)  97.6 F (36.4 C) 97.6 F (36.4 C)  TempSrc: Axillary  Oral Oral  SpO2: 100% 100% 100% 100%  Weight:  (!) 142.5 kg (314 lb 2.5 oz)    Height:        Intake/Output Summary (Last 24 hours) at 05/01/17 1508 Last data filed at 05/01/17 1400  Gross per 24 hour  Intake             2115 ml  Output              925 ml  Net             1190 ml   Filed Weights   04/29/17 0500 04/30/17 0500 05/01/17 0500  Weight: 135.9 kg (299 lb 9.7 oz) (!) 139.7 kg (307 lb 15.7 oz) (!) 142.5 kg (314 lb 2.5 oz)    Examination:  General exam: NAD. Respiratory system: decrease air entry B/L . Cardiovascular system: RRR,S1S2. Gastrointestinal system: Abdomen is nondistended, soft and nontender. No organomegaly  or masses felt. Normal bowel sounds heard. Central nervous system: non focal . Extremities: Symmetric 5 x 5 power. Skin: No rashes,Lower ext stasis with ulcers.  Psychiatry: Judgement and insight appear normal. Mood & affect appropriate.     Data Reviewed: I have personally reviewed following labs and imaging studies  CBC:  Recent Labs Lab 04/28/17 2125 04/29/17 0236 04/30/17 0525 05/01/17 0428  WBC 23.1* 21.3* 12.6* 7.6  NEUTROABS 20.1*  --   --   --   HGB 9.3* 11.4* 11.0* 10.3*  HCT 30.3* 37.3 37.1 34.3*  MCV 92.1 92.8 94.4 93.2  PLT 295 188 158 673   Basic Metabolic Panel:  Recent Labs Lab 04/28/17 2125  04/29/17 0236 04/30/17 0525 05/01/17 0428  NA 139 139 147* 146*  K 4.3 4.5 3.9 3.8  CL 100* 103 111 113*  CO2 30 29 29 29   GLUCOSE 189* 215* 83 148*  BUN 37* 38* 34* 26*  CREATININE 1.60* 1.85* 1.87* 1.62*  CALCIUM 9.2 8.6* 8.1* 8.1*   GFR: Estimated Creatinine Clearance: 39.7 mL/min (A) (by C-G formula based on SCr of 1.62 mg/dL (H)). Liver Function Tests:  Recent Labs Lab 04/28/17 2125 05/01/17 0428  AST 28 34  ALT 25 22  ALKPHOS 101 67  BILITOT 1.0 0.7  PROT 8.9* 6.6  ALBUMIN 3.3* 2.1*    Recent Labs Lab 04/28/17 2125  LIPASE 21    Recent Labs Lab 04/28/17 2125  AMMONIA 20   Coagulation Profile:  Recent Labs Lab 04/28/17 2125  INR 1.09   Cardiac Enzymes: No results for input(s): CKTOTAL, CKMB, CKMBINDEX, TROPONINI in the last 168 hours. BNP (last 3 results) No results for input(s): PROBNP in the last 8760 hours. HbA1C: No results for input(s): HGBA1C in the last 72 hours. CBG:  Recent Labs Lab 04/30/17 2042 05/01/17 0044 05/01/17 0419 05/01/17 0843 05/01/17 1208  GLUCAP 250* 208* 145* 135* 185*   Lipid Profile: No results for input(s): CHOL, HDL, LDLCALC, TRIG, CHOLHDL, LDLDIRECT in the last 72 hours. Thyroid Function Tests: No results for input(s): TSH, T4TOTAL, FREET4, T3FREE, THYROIDAB in the last 72 hours. Anemia Panel: No results for input(s): VITAMINB12, FOLATE, FERRITIN, TIBC, IRON, RETICCTPCT in the last 72 hours. Urine analysis:    Component Value Date/Time   COLORURINE YELLOW 04/28/2017 2305   APPEARANCEUR HAZY (A) 04/28/2017 2305   LABSPEC 1.005 04/28/2017 2305   PHURINE 5.0 04/28/2017 2305   GLUCOSEU NEGATIVE 04/28/2017 2305   HGBUR NEGATIVE 04/28/2017 2305   BILIRUBINUR NEGATIVE 04/28/2017 2305   KETONESUR NEGATIVE 04/28/2017 2305   PROTEINUR 30 (A) 04/28/2017 2305   UROBILINOGEN 0.2 07/18/2015 0609   NITRITE NEGATIVE 04/28/2017 2305   LEUKOCYTESUR LARGE (A) 04/28/2017 2305   Sepsis  Labs: @LABRCNTIP (procalcitonin:4,lacticidven:4)  ) Recent Results (from the past 240 hour(s))  Blood Culture (routine x 2)     Status: None (Preliminary result)   Collection Time: 04/28/17  9:20 PM  Result Value Ref Range Status   Specimen Description BLOOD RIGHT ARM  Final   Special Requests   Final    BOTTLES DRAWN AEROBIC AND ANAEROBIC Blood Culture adequate volume   Culture NO GROWTH 3 DAYS  Final   Report Status PENDING  Incomplete  Blood Culture (routine x 2)     Status: Abnormal   Collection Time: 04/28/17  9:24 PM  Result Value Ref Range Status   Specimen Description BLOOD LEFT ANTECUBITAL  Final   Special Requests   Final    BOTTLES DRAWN AEROBIC AND ANAEROBIC  Blood Culture adequate volume   Culture  Setup Time   Final    GRAM POSITIVE COCCI IN CLUSTERS IN BOTH AEROBIC AND ANAEROBIC BOTTLES CRITICAL RESULT CALLED TO, READ BACK BY AND VERIFIED WITH: A MAYER PHARMD 1841 04/29/17 A BROWNING    Culture (A)  Final    STAPHYLOCOCCUS SPECIES (COAGULASE NEGATIVE) THE SIGNIFICANCE OF ISOLATING THIS ORGANISM FROM A SINGLE SET OF BLOOD CULTURES WHEN MULTIPLE SETS ARE DRAWN IS UNCERTAIN. PLEASE NOTIFY THE MICROBIOLOGY DEPARTMENT WITHIN ONE WEEK IF SPECIATION AND SENSITIVITIES ARE REQUIRED.    Report Status 05/01/2017 FINAL  Final  Blood Culture ID Panel (Reflexed)     Status: Abnormal   Collection Time: 04/28/17  9:24 PM  Result Value Ref Range Status   Enterococcus species NOT DETECTED NOT DETECTED Final   Listeria monocytogenes NOT DETECTED NOT DETECTED Final   Staphylococcus species DETECTED (A) NOT DETECTED Final    Comment: Methicillin (oxacillin) resistant coagulase negative staphylococcus. Possible blood culture contaminant (unless isolated from more than one blood culture draw or clinical case suggests pathogenicity). No antibiotic treatment is indicated for blood  culture contaminants. CRITICAL RESULT CALLED TO, READ BACK BY AND VERIFIED WITH: A MAYER PHARMD 1841 04/29/17 A  BROWNING    Staphylococcus aureus NOT DETECTED NOT DETECTED Final   Methicillin resistance DETECTED (A) NOT DETECTED Final    Comment: CRITICAL RESULT CALLED TO, READ BACK BY AND VERIFIED WITH: A MAYER PHARMD 1841 04/29/17 A BROWNING    Streptococcus species NOT DETECTED NOT DETECTED Final   Streptococcus agalactiae NOT DETECTED NOT DETECTED Final   Streptococcus pneumoniae NOT DETECTED NOT DETECTED Final   Streptococcus pyogenes NOT DETECTED NOT DETECTED Final   Acinetobacter baumannii NOT DETECTED NOT DETECTED Final   Enterobacteriaceae species NOT DETECTED NOT DETECTED Final   Enterobacter cloacae complex NOT DETECTED NOT DETECTED Final   Escherichia coli NOT DETECTED NOT DETECTED Final   Klebsiella oxytoca NOT DETECTED NOT DETECTED Final   Klebsiella pneumoniae NOT DETECTED NOT DETECTED Final   Proteus species NOT DETECTED NOT DETECTED Final   Serratia marcescens NOT DETECTED NOT DETECTED Final   Haemophilus influenzae NOT DETECTED NOT DETECTED Final   Neisseria meningitidis NOT DETECTED NOT DETECTED Final   Pseudomonas aeruginosa NOT DETECTED NOT DETECTED Final   Candida albicans NOT DETECTED NOT DETECTED Final   Candida glabrata NOT DETECTED NOT DETECTED Final   Candida krusei NOT DETECTED NOT DETECTED Final   Candida parapsilosis NOT DETECTED NOT DETECTED Final   Candida tropicalis NOT DETECTED NOT DETECTED Final  MRSA PCR Screening     Status: Abnormal   Collection Time: 04/29/17  4:56 AM  Result Value Ref Range Status   MRSA by PCR POSITIVE (A) NEGATIVE Final    Comment:        The GeneXpert MRSA Assay (FDA approved for NASAL specimens only), is one component of a comprehensive MRSA colonization surveillance program. It is not intended to diagnose MRSA infection nor to guide or monitor treatment for MRSA infections. RESULT CALLED TO, READ BACK BY AND VERIFIED WITHCandiss Norse RN AT [phone] ON [id number] BY SJW   Culture, Urine     Status: Abnormal (Preliminary result)    Collection Time: 04/29/17  4:04 PM  Result Value Ref Range Status   Specimen Description URINE, CATHETERIZED  Final   Special Requests NONE  Final   Culture (A)  Final    20,000 COLONIES/mL ESCHERICHIA COLI 10,000 COLONIES/mL ENTEROCOCCUS FAECALIS SUSCEPTIBILITIES TO FOLLOW    Report Status PENDING  Incomplete         Radiology Studies: Dg Chest Port 1 View  Result Date: 04/30/2017 CLINICAL DATA:  Shortness of breath EXAM: PORTABLE CHEST 1 VIEW COMPARISON:  04/28/2017 FINDINGS: Borderline cardiomegaly. Stable aortic and hilar contours; patient had mediastinal and hilar adenopathy on January 2018 chest CT. Low volume chest with chronic interstitial prominence. No Kerley lines, effusion, or air bronchogram. IMPRESSION: Stable low volume chest without acute finding. Electronically Signed   By: Monte Fantasia M.D.   On: 04/30/2017 12:56        Scheduled Meds: . aspirin EC  81 mg Oral Daily  . atorvastatin  10 mg Oral QPM  . Chlorhexidine Gluconate Cloth  6 each Topical Q0600  . enoxaparin (LOVENOX) injection  30 mg Subcutaneous Q24H  . fluticasone  2 spray Each Nare QHS  . insulin aspart  0-15 Units Subcutaneous Q4H  . mupirocin ointment  1 application Nasal BID  . vitamin C  500 mg Oral BID   Continuous Infusions: . sodium chloride 75 mL/hr at 04/30/17 2045  . piperacillin-tazobactam (ZOSYN)  IV 3.375 g (05/01/17 1249)     LOS: 2 days    Time spent: 88 minutes     Waldron Session, MD [facility] Pager [phone]  If 7PM-7AM, please contact night-coverage [email or web address] Password [email or web address] 05/01/2017, 3:08 PM

## 2017-05-01 NOTE — Progress Notes (Signed)
Pharmacy Antibiotic Note  Heather Zimmerman. Heather Zimmerman is a 71 y.o. female admitted on 04/28/2017 with sepsis.  Pharmacy has been consulted for Zosyn dosing.  Vancomycin was stopped 7/18.  Suspected urinary source with cultures still pending. Creatinine remains elevated from her baseline.    Plan: Continue Zosyn 3.375g IV q8h extended infusion Monitor renal function and available micro data   Height: 4\' 9"  (144.8 cm) Weight: (!) 314 lb 2.5 oz (142.5 kg) IBW/kg (Calculated) : 38.6  Temp (24hrs), Avg:98.4 F (36.9 C), Min:97.6 F (36.4 C), Max:99.2 F (37.3 C)   Recent Labs Lab 04/28/17 2125 04/28/17 2129 04/29/17 0042 04/29/17 0236 04/29/17 2326 04/30/17 0525 05/01/17 0428  WBC 23.1*  --   --  21.3*  --  12.6* 7.6  CREATININE 1.60*  --   --  1.85*  --  1.87* 1.62*  LATICACIDVEN  --  1.46 1.95* 1.5 0.9  --   --     Estimated Creatinine Clearance: 39.7 mL/min (A) (by C-G formula based on SCr of 1.62 mg/dL (H)).    Allergies  Allergen Reactions  . Ace Inhibitors Other (See Comments)    unknown     Thank you for allowing pharmacy to be a part of this patient's care.  Norva Riffle 05/01/2017 10:04 AM

## 2017-05-01 NOTE — Clinical Social Work Note (Signed)
Clinical Social Work Assessment  Patient Details  Name: Heather Zimmerman. Afshar MRN: [id number] Date of Birth: Feb 17, 1946  Date of referral:  05/01/17               Reason for consult:  Facility Placement                Permission sought to share information with:  Facility Art therapist granted to share information::  Yes, Verbal Permission Granted  Name::        Agency::  [facility]  Relationship::     Contact Information:     Housing/Transportation Living arrangements for the past 2 months:  [place] of Information:  Patient Patient Interpreter Needed:  None Criminal Activity/Legal Involvement Pertinent to Current Situation/Hospitalization:  No - Comment as needed Significant Relationships:  Siblings Lives with:  Facility Resident Do you feel safe going back to the place where you live?  Yes Need for family participation in patient care:  No (Coment)  Care giving concerns:  None- pt is LTC resident at [facility].   Social Worker assessment / plan:  Pt confirmed pt is resident at [facility].  Employment status:  Disabled (Comment on whether or not currently receiving Disability) Insurance information:  Medicare PT Recommendations:  Not assessed at this time Information / Referral to community resources:  [place]  Patient/Family's Response to care: Agreeable to return to SNF when stable- understands she is not well enough to pursue other options.  Patient/Family's Understanding of and Emotional Response to Diagnosis, Current Treatment, and Prognosis:  Pt expressed understanding about current condition and is hopeful she will improve enough to leave hospital soon.  Emotional Assessment Appearance:  Appears stated age Attitude/Demeanor/Rapport:  Unable to Assess Affect (typically observed):  Unable to Assess Orientation:  Oriented to Self, Oriented to Place, Oriented to  Time, Oriented to Situation Alcohol / Substance use:   Not Applicable Psych involvement (Current and /or in the community):  No (Comment)  Discharge Needs  Concerns to be addressed:  Care Coordination Readmission within the last 30 days:  No Current discharge risk:  Physical Impairment Barriers to Discharge:  Continued Medical Work up   Jorge Ny, LCSW 05/01/2017, 12:48 PM

## 2017-05-02 LAB — CBC
HCT: 32.3 % — ABNORMAL LOW (ref 36.0–46.0)
Hemoglobin: 9.8 g/dL — ABNORMAL LOW (ref 12.0–15.0)
MCH: 27.9 pg (ref 26.0–34.0)
MCHC: 30.3 g/dL (ref 30.0–36.0)
MCV: 92 fL (ref 78.0–100.0)
PLATELETS: 159 10*3/uL (ref 150–400)
RBC: 3.51 MIL/uL — AB (ref 3.87–5.11)
RDW: 16.1 % — AB (ref 11.5–15.5)
WBC: 4.9 10*3/uL (ref 4.0–10.5)

## 2017-05-02 LAB — GLUCOSE, CAPILLARY
GLUCOSE-CAPILLARY: 154 mg/dL — AB (ref 65–99)
GLUCOSE-CAPILLARY: 127 mg/dL — AB (ref 65–99)
GLUCOSE-CAPILLARY: 179 mg/dL — AB (ref 65–99)
GLUCOSE-CAPILLARY: 198 mg/dL — AB (ref 65–99)
GLUCOSE-CAPILLARY: 170 mg/dL — AB (ref 65–99)
Glucose-Capillary: 207 mg/dL — ABNORMAL HIGH (ref 65–99)

## 2017-05-02 LAB — COMPREHENSIVE METABOLIC PANEL
ALT: 22 U/L (ref 14–54)
AST: 29 U/L (ref 15–41)
Albumin: 2 g/dL — ABNORMAL LOW (ref 3.5–5.0)
Alkaline Phosphatase: 77 U/L (ref 38–126)
Anion gap: 5 (ref 5–15)
BUN: 14 mg/dL (ref 6–20)
CHLORIDE: 112 mmol/L — AB (ref 101–111)
CO2: 28 mmol/L (ref 22–32)
CREATININE: 1.38 mg/dL — AB (ref 0.44–1.00)
Calcium: 8.4 mg/dL — ABNORMAL LOW (ref 8.9–10.3)
GFR calc non Af Amer: 37 mL/min — ABNORMAL LOW (ref >60)
GFR, EST AFRICAN AMERICAN: 43 mL/min — AB (ref >60)
Glucose, Bld: 155 mg/dL — ABNORMAL HIGH (ref 65–99)
POTASSIUM: 3.8 mmol/L (ref 3.5–5.1)
SODIUM: 145 mmol/L (ref 135–145)
Total Bilirubin: 0.7 mg/dL (ref 0.3–1.2)
Total Protein: 6.5 g/dL (ref 6.5–8.1)

## 2017-05-02 MED ORDER — DIPHENHYDRAMINE HCL 25 MG PO CAPS
25.0000 mg | ORAL_CAPSULE | Freq: Once | ORAL | Status: AC
  Administered 2017-05-03: 25 mg via ORAL
  Filled 2017-05-02: qty 1

## 2017-05-02 MED ORDER — ENOXAPARIN SODIUM 80 MG/0.8ML ~~LOC~~ SOLN
0.5000 mg/kg | SUBCUTANEOUS | Status: DC
  Administered 2017-05-02: 70 mg via SUBCUTANEOUS
  Filled 2017-05-02: qty 0.8

## 2017-05-02 NOTE — Progress Notes (Signed)
Pt placed on AutoBiPap for the night.  Pt tolerating well. VSS.

## 2017-05-02 NOTE — Progress Notes (Signed)
PROGRESS NOTE    Heather Zimmerman. [name]  YWV:[id number] DOB: 1946-07-01 DOA: 04/28/2017 PCP: Patient, No Pcp Per     Brief Narrative:   71 year old female presents to the hospital 04/29/17  with severe sepsis related to complicated UTI .   71 year old female with morbid obesity, chronic hypoxic and hypercarbic respiratory failure, chronic diastolic CHF, obstructive sleep apnea noncompliant with BiPAP, being brought to the emergency room from [facility] rehab due to lethargy.  She was found to be hypoxic and hypercarbic requiring BiPAP and was admitted to stepdown.  She was also found to be septic, was started on broad-spectrum antibiotics for presumed urinary tract infection.  Assessment & Plan:   Principal Problem:   Acute on chronic respiratory failure with hypoxia and hypercapnia (HCC) Active Problems:   Chronic diastolic heart failure (HCC)   OSA treated with BiPAP   Open wound of left lower extremity   AKI (acute kidney injury) ([place])   Type II diabetes mellitus with neurological manifestations, uncontrolled ([place])   Sepsis secondary to UTI ([place])   Acute metabolic encephalopathy  Sepsis due to UTI : resolved. -Started on broad-spectrum antibiotics zosyn/Vanc, deescalated to Zosyn per CC team , Cx is is showing ecoli with ENTEROCOCCUS FAECALIS , will continue with the final results of the cultures before de-escalating .  Acute on chronic hypoxic and hypercarbic respiratory failure -improved , CPAPat bedtime and when napping .  Acute encephalopathy -Due to #1 and #2,resolved.  Acute kidney injury -improving , tolerating oral intake with good UO , will stop the IVF .  Diastolic CHF -Closely monitor respiratory status given fluids in the setting of sepsis and renal failure, she required intubation in October 2017.  Left lower extremity wound -Consult wound care , Less likely cellulitis ,Vanc stopped .Marland Kitchen  Type 2 diabetes mellitus -Continue sliding scale   Continue  PT/OT placement soon whenever we finalize the cultures .   DVT prophylaxis: (Lovenox) Code Status: (Full)  Family Communication: (NONE )  Disposition Plan: SNIF     Consultants:   CC .  Procedures:  Antimicrobials: (specify start and planned stop date. Auto populated tables are space occupying and do not give end dates)  Zosyn#3 .  Vanc stopped after 2 days .   Subjective:  Looks much better , complains of chronic LLE pain , denies any fevers or chills , No other complaints.  Objective: Vitals:   05/02/17 0049 05/02/17 0323 05/02/17 0515 05/02/17 0733  BP: (!) 111/54  122/68 (!) 148/59  Pulse: 74 81 77 66  Resp: (!) 29 (!) 31 (!) 30 (!) 22  Temp:   98.2 F (36.8 C) 98 F (36.7 C)  TempSrc:   Oral Oral  SpO2: 100% 100% 99%   Weight:      Height:        Intake/Output Summary (Last 24 hours) at 05/02/17 1047 Last data filed at 05/02/17 1000  Gross per 24 hour  Intake             2430 ml  Output             1300 ml  Net             1130 ml   Filed Weights   04/29/17 0500 04/30/17 0500 05/01/17 0500  Weight: 135.9 kg (299 lb 9.7 oz) (!) 139.7 kg (307 lb 15.7 oz) (!) 142.5 kg (314 lb 2.5 oz)    Examination:  General exam: NAD. Respiratory system: decrease air entry B/L .  Cardiovascular system: RRR,S1S2. Gastrointestinal system: Abdomen is nondistended, soft and nontender. No organomegaly or masses felt. Normal bowel sounds heard. Central nervous system: non focal . Extremities: Symmetric 5 x 5 power. Skin: ,Lower ext stasis with ulcers, clean .  Psychiatry: Judgement and insight appear normal. Mood & affect appropriate.     Data Reviewed: I have personally reviewed following labs and imaging studies  CBC:  Recent Labs Lab 04/28/17 2125 04/29/17 0236 04/30/17 0525 05/01/17 0428 05/02/17 0550  WBC 23.1* 21.3* 12.6* 7.6 4.9  NEUTROABS 20.1*  --   --   --   --   HGB 9.3* 11.4* 11.0* 10.3* 9.8*  HCT 30.3* 37.3 37.1 34.3* 32.3*  MCV 92.1 92.8  94.4 93.2 92.0  PLT 295 188 158 154 573   Basic Metabolic Panel:  Recent Labs Lab 04/28/17 2125 04/29/17 0236 04/30/17 0525 05/01/17 0428 05/02/17 0550  NA 139 139 147* 146* 145  K 4.3 4.5 3.9 3.8 3.8  CL 100* 103 111 113* 112*  CO2 30 29 29 29 28   GLUCOSE 189* 215* 83 148* 155*  BUN 37* 38* 34* 26* 14  CREATININE 1.60* 1.85* 1.87* 1.62* 1.38*  CALCIUM 9.2 8.6* 8.1* 8.1* 8.4*   GFR: Estimated Creatinine Clearance: 46.7 mL/min (A) (by C-G formula based on SCr of 1.38 mg/dL (H)). Liver Function Tests:  Recent Labs Lab 04/28/17 2125 05/01/17 0428 05/02/17 0550  AST 28 34 29  ALT 25 22 22   ALKPHOS 101 67 77  BILITOT 1.0 0.7 0.7  PROT 8.9* 6.6 6.5  ALBUMIN 3.3* 2.1* 2.0*    Recent Labs Lab 04/28/17 2125  LIPASE 21    Recent Labs Lab 04/28/17 2125  AMMONIA 20   Coagulation Profile:  Recent Labs Lab 04/28/17 2125  INR 1.09   Cardiac Enzymes: No results for input(s): CKTOTAL, CKMB, CKMBINDEX, TROPONINI in the last 168 hours. BNP (last 3 results) No results for input(s): PROBNP in the last 8760 hours. HbA1C: No results for input(s): HGBA1C in the last 72 hours. CBG:  Recent Labs Lab 05/01/17 1600 05/01/17 2032 05/02/17 0003 05/02/17 0510 05/02/17 0732  GLUCAP 179* 199* 207* 154* 127*   Lipid Profile: No results for input(s): CHOL, HDL, LDLCALC, TRIG, CHOLHDL, LDLDIRECT in the last 72 hours. Thyroid Function Tests: No results for input(s): TSH, T4TOTAL, FREET4, T3FREE, THYROIDAB in the last 72 hours. Anemia Panel: No results for input(s): VITAMINB12, FOLATE, FERRITIN, TIBC, IRON, RETICCTPCT in the last 72 hours. Urine analysis:    Component Value Date/Time   COLORURINE YELLOW 04/28/2017 2305   APPEARANCEUR HAZY (A) 04/28/2017 2305   LABSPEC 1.005 04/28/2017 2305   PHURINE 5.0 04/28/2017 2305   GLUCOSEU NEGATIVE 04/28/2017 2305   HGBUR NEGATIVE 04/28/2017 2305   BILIRUBINUR NEGATIVE 04/28/2017 2305   KETONESUR NEGATIVE 04/28/2017 2305    PROTEINUR 30 (A) 04/28/2017 2305   UROBILINOGEN 0.2 07/18/2015 0609   NITRITE NEGATIVE 04/28/2017 2305   LEUKOCYTESUR LARGE (A) 04/28/2017 2305   Sepsis Labs: @LABRCNTIP (procalcitonin:4,lacticidven:4)  ) Recent Results (from the past 240 hour(s))  Blood Culture (routine x 2)     Status: None (Preliminary result)   Collection Time: 04/28/17  9:20 PM  Result Value Ref Range Status   Specimen Description BLOOD RIGHT ARM  Final   Special Requests   Final    BOTTLES DRAWN AEROBIC AND ANAEROBIC Blood Culture adequate volume   Culture NO GROWTH 4 DAYS  Final   Report Status PENDING  Incomplete  Blood Culture (routine x 2)  Status: Abnormal   Collection Time: 04/28/17  9:24 PM  Result Value Ref Range Status   Specimen Description BLOOD LEFT ANTECUBITAL  Final   Special Requests   Final    BOTTLES DRAWN AEROBIC AND ANAEROBIC Blood Culture adequate volume   Culture  Setup Time   Final    GRAM POSITIVE COCCI IN CLUSTERS IN BOTH AEROBIC AND ANAEROBIC BOTTLES CRITICAL RESULT CALLED TO, READ BACK BY AND VERIFIED WITH: A MAYER PHARMD 1841 04/29/17 A BROWNING    Culture (A)  Final    STAPHYLOCOCCUS SPECIES (COAGULASE NEGATIVE) THE SIGNIFICANCE OF ISOLATING THIS ORGANISM FROM A SINGLE SET OF BLOOD CULTURES WHEN MULTIPLE SETS ARE DRAWN IS UNCERTAIN. PLEASE NOTIFY THE MICROBIOLOGY DEPARTMENT WITHIN ONE WEEK IF SPECIATION AND SENSITIVITIES ARE REQUIRED.    Report Status 05/01/2017 FINAL  Final  Blood Culture ID Panel (Reflexed)     Status: Abnormal   Collection Time: 04/28/17  9:24 PM  Result Value Ref Range Status   Enterococcus species NOT DETECTED NOT DETECTED Final   Listeria monocytogenes NOT DETECTED NOT DETECTED Final   Staphylococcus species DETECTED (A) NOT DETECTED Final    Comment: Methicillin (oxacillin) resistant coagulase negative staphylococcus. Possible blood culture contaminant (unless isolated from more than one blood culture draw or clinical case suggests pathogenicity). No  antibiotic treatment is indicated for blood  culture contaminants. CRITICAL RESULT CALLED TO, READ BACK BY AND VERIFIED WITH: A MAYER PHARMD 1841 04/29/17 A BROWNING    Staphylococcus aureus NOT DETECTED NOT DETECTED Final   Methicillin resistance DETECTED (A) NOT DETECTED Final    Comment: CRITICAL RESULT CALLED TO, READ BACK BY AND VERIFIED WITH: A MAYER PHARMD 1841 04/29/17 A BROWNING    Streptococcus species NOT DETECTED NOT DETECTED Final   Streptococcus agalactiae NOT DETECTED NOT DETECTED Final   Streptococcus pneumoniae NOT DETECTED NOT DETECTED Final   Streptococcus pyogenes NOT DETECTED NOT DETECTED Final   Acinetobacter baumannii NOT DETECTED NOT DETECTED Final   Enterobacteriaceae species NOT DETECTED NOT DETECTED Final   Enterobacter cloacae complex NOT DETECTED NOT DETECTED Final   Escherichia coli NOT DETECTED NOT DETECTED Final   Klebsiella oxytoca NOT DETECTED NOT DETECTED Final   Klebsiella pneumoniae NOT DETECTED NOT DETECTED Final   Proteus species NOT DETECTED NOT DETECTED Final   Serratia marcescens NOT DETECTED NOT DETECTED Final   Haemophilus influenzae NOT DETECTED NOT DETECTED Final   Neisseria meningitidis NOT DETECTED NOT DETECTED Final   Pseudomonas aeruginosa NOT DETECTED NOT DETECTED Final   Candida albicans NOT DETECTED NOT DETECTED Final   Candida glabrata NOT DETECTED NOT DETECTED Final   Candida krusei NOT DETECTED NOT DETECTED Final   Candida parapsilosis NOT DETECTED NOT DETECTED Final   Candida tropicalis NOT DETECTED NOT DETECTED Final  MRSA PCR Screening     Status: Abnormal   Collection Time: 04/29/17  4:56 AM  Result Value Ref Range Status   MRSA by PCR POSITIVE (A) NEGATIVE Final    Comment:        The GeneXpert MRSA Assay (FDA approved for NASAL specimens only), is one component of a comprehensive MRSA colonization surveillance program. It is not intended to diagnose MRSA infection nor to guide or monitor treatment for MRSA  infections. RESULT CALLED TO, READ BACK BY AND VERIFIED WITHCandiss Norse RN AT [phone] ON [id number] BY SJW   Culture, Urine     Status: Abnormal (Preliminary result)   Collection Time: 04/29/17  4:04 PM  Result Value Ref Range Status  Specimen Description URINE, CATHETERIZED  Final   Special Requests NONE  Final   Culture (A)  Final    20,000 COLONIES/mL ESCHERICHIA COLI 10,000 COLONIES/mL ENTEROCOCCUS FAECALIS REPEATING SENSITIVITIES FOR E. COLI    Report Status PENDING  Incomplete   Organism ID, Bacteria ENTEROCOCCUS FAECALIS (A)  Final      Susceptibility   Enterococcus faecalis - MIC*    AMPICILLIN <=2 SENSITIVE Sensitive     LEVOFLOXACIN >=8 RESISTANT Resistant     NITROFURANTOIN <=16 SENSITIVE Sensitive     VANCOMYCIN 1 SENSITIVE Sensitive     * 10,000 COLONIES/mL ENTEROCOCCUS FAECALIS         Radiology Studies: Dg Chest Port 1 View  Result Date: 04/30/2017 CLINICAL DATA:  Shortness of breath EXAM: PORTABLE CHEST 1 VIEW COMPARISON:  04/28/2017 FINDINGS: Borderline cardiomegaly. Stable aortic and hilar contours; patient had mediastinal and hilar adenopathy on January 2018 chest CT. Low volume chest with chronic interstitial prominence. No Kerley lines, effusion, or air bronchogram. IMPRESSION: Stable low volume chest without acute finding. Electronically Signed   By: Monte Fantasia M.D.   On: 04/30/2017 12:56        Scheduled Meds: . aspirin EC  81 mg Oral Daily  . atorvastatin  10 mg Oral QPM  . Chlorhexidine Gluconate Cloth  6 each Topical Q0600  . enoxaparin (LOVENOX) injection  0.5 mg/kg Subcutaneous Q24H  . fluticasone  2 spray Each Nare QHS  . insulin aspart  0-15 Units Subcutaneous Q4H  . mupirocin ointment  1 application Nasal BID  . vitamin C  500 mg Oral BID   Continuous Infusions: . sodium chloride 75 mL/hr at 05/02/17 0533  . piperacillin-tazobactam (ZOSYN)  IV Stopped (05/02/17 0844)     LOS: 3 days    Time spent: 35 minutes     Waldron Session,  MD [facility] Pager [phone]  If 7PM-7AM, please contact night-coverage [email or web address] Password [facility] 05/02/2017, 10:47 AM

## 2017-05-02 NOTE — Plan of Care (Signed)
Problem: Education: Goal: Knowledge of [facility] Education information/materials will improve Outcome: Progressing POC reviewed with pt.   

## 2017-05-02 NOTE — Progress Notes (Signed)
PHARMACY NOTE:  RENAL DOSAGE ADJUSTMENT  Current Enoxparin regimen includes a mismatch between dosage and BMI.  As per policy approved by the Pharmacy & Therapeutics and Medical Executive Committees, the Lovenox dosage will be adjusted accordingly.  Current dosage:  Lovenox 30mg  SQ q24h  Indication: VTE prophylaxis  Renal Function:  Estimated Creatinine Clearance: 46.7 mL/min (A) (by C-G formula based on SCr of 1.38 mg/dL (H)). []      On intermittent HD, scheduled: []      On CRRT  BMI >30    Dosage has been changed to:  Lovenox 0.5mg /kg SQ q24h for VTE prophylaxis for patients with BMI >30  Additional comments:   Thank you for allowing pharmacy to be a part of this patient's care.  Norva Riffle, [facility] 05/02/2017 9:00 AM

## 2017-05-03 DIAGNOSIS — R0602 Shortness of breath: Secondary | ICD-10-CM | POA: Diagnosis not present

## 2017-05-03 DIAGNOSIS — N390 Urinary tract infection, site not specified: Secondary | ICD-10-CM | POA: Diagnosis not present

## 2017-05-03 DIAGNOSIS — S81802D Unspecified open wound, left lower leg, subsequent encounter: Secondary | ICD-10-CM

## 2017-05-03 DIAGNOSIS — G9341 Metabolic encephalopathy: Secondary | ICD-10-CM

## 2017-05-03 DIAGNOSIS — E109 Type 1 diabetes mellitus without complications: Secondary | ICD-10-CM | POA: Diagnosis not present

## 2017-05-03 LAB — COMPREHENSIVE METABOLIC PANEL
ALK PHOS: 70 U/L (ref 38–126)
ALT: 22 U/L (ref 14–54)
AST: 23 U/L (ref 15–41)
Albumin: 2 g/dL — ABNORMAL LOW (ref 3.5–5.0)
Anion gap: 5 (ref 5–15)
BILIRUBIN TOTAL: 0.7 mg/dL (ref 0.3–1.2)
BUN: 10 mg/dL (ref 6–20)
CALCIUM: 8.5 mg/dL — AB (ref 8.9–10.3)
CO2: 27 mmol/L (ref 22–32)
CREATININE: 1.13 mg/dL — AB (ref 0.44–1.00)
Chloride: 109 mmol/L (ref 101–111)
GFR calc Af Amer: 55 mL/min — ABNORMAL LOW (ref >60)
GFR calc non Af Amer: 47 mL/min — ABNORMAL LOW (ref >60)
GLUCOSE: 161 mg/dL — AB (ref 65–99)
Potassium: 3.7 mmol/L (ref 3.5–5.1)
SODIUM: 141 mmol/L (ref 135–145)
TOTAL PROTEIN: 6.2 g/dL — AB (ref 6.5–8.1)

## 2017-05-03 LAB — CBC
HEMATOCRIT: 33.2 % — AB (ref 36.0–46.0)
HEMOGLOBIN: 10.3 g/dL — AB (ref 12.0–15.0)
MCH: 28.1 pg (ref 26.0–34.0)
MCHC: 31 g/dL (ref 30.0–36.0)
MCV: 90.7 fL (ref 78.0–100.0)
Platelets: 153 10*3/uL (ref 150–400)
RBC: 3.66 MIL/uL — AB (ref 3.87–5.11)
RDW: 15.3 % (ref 11.5–15.5)
WBC: 4.8 10*3/uL (ref 4.0–10.5)

## 2017-05-03 LAB — GLUCOSE, CAPILLARY
GLUCOSE-CAPILLARY: 128 mg/dL — AB (ref 65–99)
GLUCOSE-CAPILLARY: 150 mg/dL — AB (ref 65–99)
Glucose-Capillary: 155 mg/dL — ABNORMAL HIGH (ref 65–99)
Glucose-Capillary: 195 mg/dL — ABNORMAL HIGH (ref 65–99)

## 2017-05-03 LAB — URINE CULTURE

## 2017-05-03 LAB — CULTURE, BLOOD (ROUTINE X 2)
CULTURE: NO GROWTH
SPECIAL REQUESTS: ADEQUATE

## 2017-05-03 MED ORDER — NITROFURANTOIN MONOHYD MACRO 100 MG PO CAPS
100.0000 mg | ORAL_CAPSULE | Freq: Two times a day (BID) | ORAL | Status: DC
  Administered 2017-05-03: 100 mg via ORAL
  Filled 2017-05-03: qty 1

## 2017-05-03 MED ORDER — ACETAMINOPHEN 325 MG PO TABS: 650.0000 mg | ORAL_TABLET | Freq: Four times a day (QID) | ORAL | Status: AC | PRN

## 2017-05-03 MED ORDER — NITROFURANTOIN MONOHYD MACRO 100 MG PO CAPS: 100.0000 mg | ORAL_CAPSULE | Freq: Two times a day (BID) | ORAL | 0 refills | Status: DC

## 2017-05-03 MED ORDER — MUPIROCIN 2 % EX OINT: 1.0000 "application " | TOPICAL_OINTMENT | Freq: Two times a day (BID) | CUTANEOUS | 0 refills | Status: DC

## 2017-05-03 NOTE — Clinical Social Work Note (Signed)
Clinical Social Worker facilitated patient discharge including contacting patient family and facility to confirm patient discharge plans. Clinical information faxed to facility and family agreeable with plan.  CSW arranged ambulance transport via [facility] to [facility] . RN to call report prior to discharge.  Clinical Social Worker will sign off for now as social work intervention is no longer needed. Please consult [place] again if new need arises.  Heather Zimmerman B. Joline Maxcy Clinical Social Work Dept Weekend Social Worker [phone] 2:10 PM

## 2017-05-03 NOTE — Evaluation (Signed)
Physical Therapy Evaluation Patient Details Name: Heather Zimmerman. [name] MRN: [id number] DOB: 1946-07-24 Today's Date: 05/03/2017   History of Present Illness  71 year old female with morbid obesity, chronic hypoxic and hypercarbic respiratory failure, chronic diastolic CHF, obstructive sleep apnea noncompliant with BiPAP, PVD, Rt BKA, being brought to the emergency room from [facility] rehab due to lethargy.  She was found to be hypoxic and hypercarbic requiring BiPAP and was admitted to stepdown.  She was also found to be septic, was started on broad-spectrum antibiotics for presumed urinary tract infection.  Clinical Impression  Patient presents with problems listed below.  Patient to return to SNF today.  Recommend PT evaluation for continued therapy at SNF.    Follow Up Recommendations SNF;Supervision/Assistance - 24 hour    Equipment Recommendations  None recommended by PT    Recommendations for Other Services       Precautions / Restrictions Precautions Precautions: Fall Restrictions Weight Bearing Restrictions: No      Mobility  Bed Mobility Overal bed mobility: Needs Assistance Bed Mobility: Rolling Rolling: Max assist;+2 for physical assistance         General bed mobility comments: Attempted rolling - patient requiring max assist.   Transfers                 General transfer comment: NT - patient reports use of lift equipment at SNF  Ambulation/Gait             General Gait Details: N/A  Stairs            Wheelchair Mobility    Modified Rankin (Stroke Patients Only)       Balance                                             Pertinent Vitals/Pain Pain Assessment: Faces Faces Pain Scale: Hurts little more Pain Location: Head Pain Descriptors / Indicators: Headache Pain Intervention(s): Monitored during session;Patient requesting pain meds-RN notified    Home Living Family/patient expects to be discharged to::  Skilled nursing facility                 Additional Comments: Resident of [facility]    Prior Function Level of Independence: Needs assistance   Gait / Transfers Assistance Needed: pt reports use of mechanical standing lift for transfers           Hand Dominance   Dominant Hand: Left    Extremity/Trunk Assessment   Upper Extremity Assessment Upper Extremity Assessment: Generalized weakness    Lower Extremity Assessment Lower Extremity Assessment: RLE deficits/detail;LLE deficits/detail RLE Deficits / Details: BKA; hip flexion and knee flexion 3-/5 LLE Deficits / Details: Strength knee extension and ankle DF/PF 3/5,  hip flexion grossly 2/5 LLE Coordination: decreased gross motor       Communication   Communication: Expressive difficulties (Mumbled speech, low volume)  Cognition Arousal/Alertness: Awake/alert Behavior During Therapy: WFL for tasks assessed/performed Overall Cognitive Status: Within Functional Limits for tasks assessed                                        General Comments      Exercises     Assessment/Plan    PT Assessment All further PT needs can be met in the next venue  of care  PT Problem List Decreased strength;Decreased balance;Decreased mobility;Decreased coordination;Obesity;Pain       PT Treatment Interventions      PT Goals (Current goals can be found in the Care Plan section)  Acute Rehab PT Goals PT Goal Formulation:  (Patient to continue PT at SNF)    Frequency     Barriers to discharge        Co-evaluation               AM-PAC PT "6 Clicks" Daily Activity  Outcome Measure Difficulty turning over in bed (including adjusting bedclothes, sheets and blankets)?: Total Difficulty moving from lying on back to sitting on the side of the bed? : Total Difficulty sitting down on and standing up from a chair with arms (e.g., wheelchair, bedside commode, etc,.)?: Total Help needed moving to and from  a bed to chair (including a wheelchair)?: Total Help needed walking in hospital room?: Total Help needed climbing 3-5 steps with a railing? : Total 6 Click Score: 6    End of Session   Activity Tolerance: Patient tolerated treatment well Patient left: in bed;with call bell/phone within reach   PT Visit Diagnosis: Muscle weakness (generalized) (M62.81);Other abnormalities of gait and mobility (R26.89)    Time: 1202-1213 PT Time Calculation (min) (ACUTE ONLY): 11 min   Charges:   PT Evaluation $PT Eval Low Complexity: 1 Procedure     PT G Codes:        Heather Zimmerman. Heather Zimmerman, Heather Zimmerman Acute Rehab Services Pager [phone]   Heather Zimmerman 05/03/2017, 3:19 PM

## 2017-05-03 NOTE — Clinical Social Work Placement (Signed)
   CLINICAL SOCIAL WORK PLACEMENT  NOTE  Date:  05/03/2017  Patient Details  Name: Delara Shepheard. [place]lar MRN: [id number] Date of Birth: [birth year]  Clinical Social Work is seeking post-discharge placement for this patient at the [place] level of care (*CSW will initial, date and re-position this form in  chart as items are completed):  Yes   Patient/family provided with [place] Work Department's list of facilities offering this level of care within the geographic area requested by the patient (or if unable, by the patient's family).  Yes   Patient/family informed of their freedom to choose among providers that offer the needed level of care, that participate in Medicare, Medicaid or managed care program needed by the patient, have an available bed and are willing to accept the patient.  Yes   Patient/family informed of [facility]'s ownership interest in [facility] and [facility], as well as of the fact that they are under no obligation to receive care at these facilities.  PASRR submitted to EDS on       PASRR number received on       Existing PASRR number confirmed on 05/03/17     FL2 transmitted to all facilities in geographic area requested by pt/family on       FL2 transmitted to all facilities within larger geographic area on       Patient informed that his/her managed care company has contracts with or will negotiate with certain facilities, including the following:            Patient/family informed of bed offers received.  Patient chooses bed at  (Pt long term resident @ [facility])     Physician recommends and patient chooses bed at      Patient to be transferred to   on 05/03/17.  Patient to be transferred to facility by  [facility])     Patient family notified on 05/03/17 of transfer.  Name of family member notified:  Brother Willie     PHYSICIAN Please sign FL2     Additional Comment:     _______________________________________________ Serafina Mitchell, LCSWA 05/03/2017, 2:16 PM

## 2017-05-03 NOTE — Discharge Summary (Signed)
Physician Discharge Summary  Heather Zimmerman. Heather Zimmerman TSV:[id number] DOB: 1946/03/20 DOA: 04/28/2017  PCP: Patient, No Pcp Per  Admit date: 04/28/2017 Discharge date: 05/03/2017  Admitted From: (SNF) Disposition: SNF  Recommendations for Outpatient Follow-up:  1. Follow up with PCP in 1-2 weeks .   Home Health:no Equipment/Devices:NO  Discharge Condition: (Stable)  CODE STATUS:(FULL)  Diet recommendation: Heart Healthy / Carb Modified    Brief/Interim Summary:  71 year old female with morbid obesity, chronic hypoxic and hypercarbic respiratory failure, chronic diastolic CHF, obstructive sleep apnea noncompliant with BiPAP, being brought to the emergency room from [facility] rehab due to lethargy. She was found to be hypoxic and hypercarbic requiring BiPAP and was admitted to stepdown. She was also found to be in severe sepsis related to complicated UTI . Started on broad-spectrum antibiotics zosyn/Vanc, deescalated to Zosyn, UCx grew ecoli with ENTEROCOCCUS FAECALIS both sus to NITROFURANTOIN . She will be Discharged back to her SNF after she completely recovered and back to her baseline, she will complete a course of treatment with 10 days by nitrofurantoin.       Discharge Diagnoses:    Sepsis due to complicated UTI : resolved. -Started on broad-spectrum antibiotics zosyn/Vanc, deescalated to Zosyn , Cx  Grew ecoli with ENTEROCOCCUS FAECALIS , susceptible to nitrofurantoin .   Acute on chronic hypoxic and hypercarbic respiratory failure -In the setting of morbid obesity with mechanical hypoventilation syndrome . -improved , CPAPat bedtime and when napping .  Acute encephalopathy -Due to #1 and #2,resolved.  Acute kidney injury -improving , tolerating oral intake with good UO , will stop the IVF .  Diastolic CHF -She was a dry when she was admitted and required IV fluids, diuretics have changed to when necessary at the time of the discharge .  Left lower extremity  wound -wound care , Less likely cellulitis ,Vanc stopped .  Type 2 diabetes mellitus Diet control -Continue sliding scale    Discharge Instructions  Discharge Instructions    Diet - low sodium heart healthy    Complete by:  As directed    Increase activity slowly    Complete by:  As directed      Allergies as of 05/03/2017      Reactions   Ace Inhibitors Other (See Comments)   unknown      Medication List    STOP taking these medications   ANTISEPTIC ORAL RINSE MT   BASAGLAR KWIKPEN 100 UNIT/ML Sopn   gabapentin 600 MG tablet Commonly known as:  NEURONTIN   Insulin Glargine 100 UNIT/ML Solostar Pen Commonly known as:  LANTUS SOLOSTAR   potassium chloride SA 20 MEQ tablet Commonly known as:  K-DUR,KLOR-CON   torsemide 20 MG tablet Commonly known as:  DEMADEX   UNABLE TO FIND     TAKE these medications   acetaminophen 325 MG tablet Commonly known as:  TYLENOL Take 2 tablets (650 mg total) by mouth every 6 (six) hours as needed for mild pain (or Fever >/= 101).   aspirin EC 81 MG tablet Take 81 mg by mouth daily.   atorvastatin 10 MG tablet Commonly known as:  LIPITOR Take 10 mg by mouth every evening.   cholecalciferol 1000 units tablet Commonly known as:  VITAMIN D Take 2,000 Units by mouth daily.   DULoxetine 20 MG capsule Commonly known as:  CYMBALTA Take 40 mg by mouth daily.   feeding supplement (PRO-STAT SUGAR FREE 64) Liqd Take 30 mLs by mouth 3 (three) times daily with meals. For  wound healing and skin integrity   fluticasone 50 MCG/ACT nasal spray Commonly known as:  FLONASE Place 2 sprays into both nostrils at bedtime.   Fluticasone-Salmeterol 250-50 MCG/DOSE Aepb Commonly known as:  ADVAIR Inhale 1 puff into the lungs every 12 (twelve) hours. Reported on 04/25/2016   insulin aspart 100 UNIT/ML injection Commonly known as:  novoLOG Inject 0-15 Units into the skin See admin instructions. Inject 0-15 units subcutaneously before  meals and at bedtime per sliding scale:  CBG 0-120 0 units, 121-150 2 units, 151-200 3 units, 201-250 5 units, 251-300 8 units, 301-350 11 units, 351-400 15 units   ipratropium-albuterol 0.5-2.5 (3) MG/3ML Soln Commonly known as:  DUONEB Take 3 mLs by nebulization every 6 (six) hours as needed (shortness of breath).   mupirocin ointment 2 % Commonly known as:  BACTROBAN Place 1 application into the nose 2 (two) times daily.   nitrofurantoin (macrocrystal-monohydrate) 100 MG capsule Commonly known as:  MACROBID Take 1 capsule (100 mg total) by mouth 2 (two) times daily.   OXYGEN Inhale 2 L/min into the lungs continuous. 2 liter/min via nasal cannula   PRESCRIPTION MEDICATION Inhale into the lungs at bedtime. BIPAP   UNNA-FLEX ELASTIC UNNA BOOT EX Apply to left leg topically ever Tuesday and Friday   vitamin C 500 MG tablet Commonly known as:  ASCORBIC ACID Take 500 mg by mouth 2 (two) times daily.       Allergies  Allergen Reactions  . Ace Inhibitors Other (See Comments)    unknown    Consultations:  CC.   Procedures/Studies: US Renal  Result Date: 04/29/2017 CLINICAL DATA:  Acute kidney injury.  History of diabetes. EXAM: RENAL / URINARY TRACT ULTRASOUND COMPLETE COMPARISON:  Renal ultrasound October 04, 2015. FINDINGS: Per technologist note, habitus limited examination. Right Kidney: Length: 9.6 cm. Echogenicity within normal limits. No mass or hydronephrosis visualized. 2.8 x 2.4 x 2.2 cm anechoic cyst lower pole. Left Kidney: Length: 9.5 cm. Echogenicity within normal limits. No mass or hydronephrosis visualized. Bladder: Decompressed. IMPRESSION: Habitus limited examination without obstructive uropathy. Re- demonstration of RIGHT renal cyst, measuring smaller today though, this may be technical. Electronically Signed   By: Elon Alas M.D.   On: 04/29/2017 03:34   Dg Chest Port 1 View  Result Date: 04/30/2017 CLINICAL DATA:  Shortness of breath EXAM:  PORTABLE CHEST 1 VIEW COMPARISON:  04/28/2017 FINDINGS: Borderline cardiomegaly. Stable aortic and hilar contours; patient had mediastinal and hilar adenopathy on January 2018 chest CT. Low volume chest with chronic interstitial prominence. No Kerley lines, effusion, or air bronchogram. IMPRESSION: Stable low volume chest without acute finding. Electronically Signed   By: Monte Fantasia M.D.   On: 04/30/2017 12:56   Dg Chest Port 1 View  Result Date: 04/28/2017 CLINICAL DATA:  71 year old female with shortness of breath and sepsis. EXAM: PORTABLE CHEST 1 VIEW COMPARISON:  Chest radiograph dated 12/29/2016 FINDINGS: There is shallow inspiration. Minimal bibasilar atelectatic changes noted. There is no focal consolidation, pleural effusion, or pneumothorax. The right upper lobe and right apical region is not well evaluated due to superimposition of the patient's jaw. Stable mild cardiomegaly. Minimal central vascular prominence may represent mild congestion. No acute osseous pathology. IMPRESSION: 1. Shallow inspiration with bibasilar atelectatic changes. 2. Stable mild cardiomegaly with probable mild vascular congestion. Electronically Signed   By: Anner Crete M.D.   On: 04/28/2017 21:13    (Echo, Carotid, EGD, Colonoscopy, ERCP)    Subjective:   Discharge Exam: Vitals:  05/03/17 0406 05/03/17 0747  BP: (!) 145/67 121/63  Pulse: 67 84  Resp: (!) 23 (!) 36  Temp: 98.8 F (37.1 C) 98.7 F (37.1 C)   Vitals:   05/02/17 2051 05/03/17 0033 05/03/17 0406 05/03/17 0747  BP: (!) 141/74 (!) 153/65 (!) 145/67 121/63  Pulse: 76 78 67 84  Resp: (!) 23 (!) 25 (!) 23 (!) 36  Temp: 98.5 F (36.9 C) 99.6 F (37.6 C) 98.8 F (37.1 C) 98.7 F (37.1 C)  TempSrc: Oral Oral Oral Axillary  SpO2: 99% 100% 100% 100%  Weight:   (!) 148.8 kg (328 lb 1.6 oz)   Height:        General: NAD Cardiovascular: RRR,S1S2. Respiratory: CTA bilaterally, Decreased air entry B/L . Abdominal: Soft, NT, ND,  bowel sounds + Extremities: no edema, no cyanosis    The results of significant diagnostics from this hospitalization (including imaging, microbiology, ancillary and laboratory) are listed below for reference.     Microbiology: Recent Results (from the past 240 hour(s))  Blood Culture (routine x 2)     Status: None (Preliminary result)   Collection Time: 04/28/17  9:20 PM  Result Value Ref Range Status   Specimen Description BLOOD RIGHT ARM  Final   Special Requests   Final    BOTTLES DRAWN AEROBIC AND ANAEROBIC Blood Culture adequate volume   Culture NO GROWTH 4 DAYS  Final   Report Status PENDING  Incomplete  Blood Culture (routine x 2)     Status: Abnormal   Collection Time: 04/28/17  9:24 PM  Result Value Ref Range Status   Specimen Description BLOOD LEFT ANTECUBITAL  Final   Special Requests   Final    BOTTLES DRAWN AEROBIC AND ANAEROBIC Blood Culture adequate volume   Culture  Setup Time   Final    GRAM POSITIVE COCCI IN CLUSTERS IN BOTH AEROBIC AND ANAEROBIC BOTTLES CRITICAL RESULT CALLED TO, READ BACK BY AND VERIFIED WITH: A MAYER PHARMD 1841 04/29/17 A BROWNING    Culture (A)  Final    STAPHYLOCOCCUS SPECIES (COAGULASE NEGATIVE) THE SIGNIFICANCE OF ISOLATING THIS ORGANISM FROM A SINGLE SET OF BLOOD CULTURES WHEN MULTIPLE SETS ARE DRAWN IS UNCERTAIN. PLEASE NOTIFY THE MICROBIOLOGY DEPARTMENT WITHIN ONE WEEK IF SPECIATION AND SENSITIVITIES ARE REQUIRED.    Report Status 05/01/2017 FINAL  Final  Blood Culture ID Panel (Reflexed)     Status: Abnormal   Collection Time: 04/28/17  9:24 PM  Result Value Ref Range Status   Enterococcus species NOT DETECTED NOT DETECTED Final   Listeria monocytogenes NOT DETECTED NOT DETECTED Final   Staphylococcus species DETECTED (A) NOT DETECTED Final    Comment: Methicillin (oxacillin) resistant coagulase negative staphylococcus. Possible blood culture contaminant (unless isolated from more than one blood culture draw or clinical case  suggests pathogenicity). No antibiotic treatment is indicated for blood  culture contaminants. CRITICAL RESULT CALLED TO, READ BACK BY AND VERIFIED WITH: A MAYER PHARMD 1841 04/29/17 A BROWNING    Staphylococcus aureus NOT DETECTED NOT DETECTED Final   Methicillin resistance DETECTED (A) NOT DETECTED Final    Comment: CRITICAL RESULT CALLED TO, READ BACK BY AND VERIFIED WITH: A MAYER PHARMD 1841 04/29/17 A BROWNING    Streptococcus species NOT DETECTED NOT DETECTED Final   Streptococcus agalactiae NOT DETECTED NOT DETECTED Final   Streptococcus pneumoniae NOT DETECTED NOT DETECTED Final   Streptococcus pyogenes NOT DETECTED NOT DETECTED Final   Acinetobacter baumannii NOT DETECTED NOT DETECTED Final   Enterobacteriaceae species NOT  DETECTED NOT DETECTED Final   Enterobacter cloacae complex NOT DETECTED NOT DETECTED Final   Escherichia coli NOT DETECTED NOT DETECTED Final   Klebsiella oxytoca NOT DETECTED NOT DETECTED Final   Klebsiella pneumoniae NOT DETECTED NOT DETECTED Final   Proteus species NOT DETECTED NOT DETECTED Final   Serratia marcescens NOT DETECTED NOT DETECTED Final   Haemophilus influenzae NOT DETECTED NOT DETECTED Final   Neisseria meningitidis NOT DETECTED NOT DETECTED Final   Pseudomonas aeruginosa NOT DETECTED NOT DETECTED Final   Candida albicans NOT DETECTED NOT DETECTED Final   Candida glabrata NOT DETECTED NOT DETECTED Final   Candida krusei NOT DETECTED NOT DETECTED Final   Candida parapsilosis NOT DETECTED NOT DETECTED Final   Candida tropicalis NOT DETECTED NOT DETECTED Final  MRSA PCR Screening     Status: Abnormal   Collection Time: 04/29/17  4:56 AM  Result Value Ref Range Status   MRSA by PCR POSITIVE (A) NEGATIVE Final    Comment:        The GeneXpert MRSA Assay (FDA approved for NASAL specimens only), is one component of a comprehensive MRSA colonization surveillance program. It is not intended to diagnose MRSA infection nor to guide or monitor  treatment for MRSA infections. RESULT CALLED TO, READ BACK BY AND VERIFIED WITH: Candiss Norse RN AT [phone] ON [id number] BY SJW   Culture, Urine     Status: Abnormal   Collection Time: 04/29/17  4:04 PM  Result Value Ref Range Status   Specimen Description URINE, CATHETERIZED  Final   Special Requests NONE  Final   Culture (A)  Final    20,000 COLONIES/mL ESCHERICHIA COLI 10,000 COLONIES/mL ENTEROCOCCUS FAECALIS    Report Status 05/03/2017 FINAL  Final   Organism ID, Bacteria ENTEROCOCCUS FAECALIS (A)  Final   Organism ID, Bacteria ESCHERICHIA COLI (A)  Final      Susceptibility   Escherichia coli - MIC*    AMPICILLIN >=32 RESISTANT Resistant     CEFAZOLIN <=4 SENSITIVE Sensitive     CEFTRIAXONE <=1 SENSITIVE Sensitive     CIPROFLOXACIN >=4 RESISTANT Resistant     GENTAMICIN <=1 SENSITIVE Sensitive     IMIPENEM <=0.25 SENSITIVE Sensitive     NITROFURANTOIN <=16 SENSITIVE Sensitive     TRIMETH/SULFA >=320 RESISTANT Resistant     AMPICILLIN/SULBACTAM 16 INTERMEDIATE Intermediate     PIP/TAZO <=4 SENSITIVE Sensitive     Extended ESBL NEGATIVE Sensitive     * 20,000 COLONIES/mL ESCHERICHIA COLI   Enterococcus faecalis - MIC*    AMPICILLIN <=2 SENSITIVE Sensitive     LEVOFLOXACIN >=8 RESISTANT Resistant     NITROFURANTOIN <=16 SENSITIVE Sensitive     VANCOMYCIN 1 SENSITIVE Sensitive     * 10,000 COLONIES/mL ENTEROCOCCUS FAECALIS     Labs: BNP (last 3 results)  Recent Labs  09/23/16 2354 12/29/16 0101 04/28/17 2125  BNP 124.6* 28.7 00.7   Basic Metabolic Panel:  Recent Labs Lab 04/29/17 0236 04/30/17 0525 05/01/17 0428 05/02/17 0550 05/03/17 0623  NA 139 147* 146* 145 141  K 4.5 3.9 3.8 3.8 3.7  CL 103 111 113* 112* 109  CO2 29 29 29 28 27   GLUCOSE 215* 83 148* 155* 161*  BUN 38* 34* 26* 14 10  CREATININE 1.85* 1.87* 1.62* 1.38* 1.13*  CALCIUM 8.6* 8.1* 8.1* 8.4* 8.5*   Liver Function Tests:  Recent Labs Lab 04/28/17 2125 05/01/17 0428 05/02/17 0550  05/03/17 0623  AST 28 34 29 23  ALT 25 22 22  22  ALKPHOS 101 67 77 70  BILITOT 1.0 0.7 0.7 0.7  PROT 8.9* 6.6 6.5 6.2*  ALBUMIN 3.3* 2.1* 2.0* 2.0*    Recent Labs Lab 04/28/17 2125  LIPASE 21    Recent Labs Lab 04/28/17 2125  AMMONIA 20   CBC:  Recent Labs Lab 04/28/17 2125 04/29/17 0236 04/30/17 0525 05/01/17 0428 05/02/17 0550 05/03/17 0623  WBC 23.1* 21.3* 12.6* 7.6 4.9 4.8  NEUTROABS 20.1*  --   --   --   --   --   HGB 9.3* 11.4* 11.0* 10.3* 9.8* 10.3*  HCT 30.3* 37.3 37.1 34.3* 32.3* 33.2*  MCV 92.1 92.8 94.4 93.2 92.0 90.7  PLT 295 188 158 154 159 153   Cardiac Enzymes: No results for input(s): CKTOTAL, CKMB, CKMBINDEX, TROPONINI in the last 168 hours. BNP: Invalid input(s): POCBNP CBG:  Recent Labs Lab 05/02/17 1634 05/02/17 2047 05/03/17 0050 05/03/17 0453 05/03/17 0743  GLUCAP 198* 170* 195* 155* 128*   D-Dimer No results for input(s): DDIMER in the last 72 hours. Hgb A1c No results for input(s): HGBA1C in the last 72 hours. Lipid Profile No results for input(s): CHOL, HDL, LDLCALC, TRIG, CHOLHDL, LDLDIRECT in the last 72 hours. Thyroid function studies No results for input(s): TSH, T4TOTAL, T3FREE, THYROIDAB in the last 72 hours.  Invalid input(s): FREET3 Anemia work up No results for input(s): VITAMINB12, FOLATE, FERRITIN, TIBC, IRON, RETICCTPCT in the last 72 hours. Urinalysis    Component Value Date/Time   COLORURINE YELLOW 04/28/2017 2305   APPEARANCEUR HAZY (A) 04/28/2017 2305   LABSPEC 1.005 04/28/2017 2305   PHURINE 5.0 04/28/2017 2305   GLUCOSEU NEGATIVE 04/28/2017 2305   HGBUR NEGATIVE 04/28/2017 2305   BILIRUBINUR NEGATIVE 04/28/2017 2305   KETONESUR NEGATIVE 04/28/2017 2305   PROTEINUR 30 (A) 04/28/2017 2305   UROBILINOGEN 0.2 07/18/2015 0609   NITRITE NEGATIVE 04/28/2017 2305   LEUKOCYTESUR LARGE (A) 04/28/2017 2305   Sepsis Labs Invalid input(s): PROCALCITONIN,  WBC,  LACTICIDVEN Microbiology Recent Results  (from the past 240 hour(s))  Blood Culture (routine x 2)     Status: None (Preliminary result)   Collection Time: 04/28/17  9:20 PM  Result Value Ref Range Status   Specimen Description BLOOD RIGHT ARM  Final   Special Requests   Final    BOTTLES DRAWN AEROBIC AND ANAEROBIC Blood Culture adequate volume   Culture NO GROWTH 4 DAYS  Final   Report Status PENDING  Incomplete  Blood Culture (routine x 2)     Status: Abnormal   Collection Time: 04/28/17  9:24 PM  Result Value Ref Range Status   Specimen Description BLOOD LEFT ANTECUBITAL  Final   Special Requests   Final    BOTTLES DRAWN AEROBIC AND ANAEROBIC Blood Culture adequate volume   Culture  Setup Time   Final    GRAM POSITIVE COCCI IN CLUSTERS IN BOTH AEROBIC AND ANAEROBIC BOTTLES CRITICAL RESULT CALLED TO, READ BACK BY AND VERIFIED WITH: A MAYER PHARMD [place] 04/29/17 A BROWNING    Culture (A)  Final    STAPHYLOCOCCUS SPECIES (COAGULASE NEGATIVE) THE SIGNIFICANCE OF ISOLATING THIS ORGANISM FROM A SINGLE SET OF BLOOD CULTURES WHEN MULTIPLE SETS ARE DRAWN IS UNCERTAIN. PLEASE NOTIFY THE MICROBIOLOGY DEPARTMENT WITHIN ONE WEEK IF SPECIATION AND SENSITIVITIES ARE REQUIRED.    Report Status 05/01/2017 FINAL  Final  Blood Culture ID Panel (Reflexed)     Status: Abnormal   Collection Time: 04/28/17  9:24 PM  Result Value Ref Range Status   Enterococcus species  NOT DETECTED NOT DETECTED Final   Listeria monocytogenes NOT DETECTED NOT DETECTED Final   Staphylococcus species DETECTED (A) NOT DETECTED Final    Comment: Methicillin (oxacillin) resistant coagulase negative staphylococcus. Possible blood culture contaminant (unless isolated from more than one blood culture draw or clinical case suggests pathogenicity). No antibiotic treatment is indicated for blood  culture contaminants. CRITICAL RESULT CALLED TO, READ BACK BY AND VERIFIED WITH: A MAYER PHARMD 1841 04/29/17 A BROWNING    Staphylococcus aureus NOT DETECTED NOT DETECTED Final    Methicillin resistance DETECTED (A) NOT DETECTED Final    Comment: CRITICAL RESULT CALLED TO, READ BACK BY AND VERIFIED WITH: A MAYER PHARMD 1841 04/29/17 A BROWNING    Streptococcus species NOT DETECTED NOT DETECTED Final   Streptococcus agalactiae NOT DETECTED NOT DETECTED Final   Streptococcus pneumoniae NOT DETECTED NOT DETECTED Final   Streptococcus pyogenes NOT DETECTED NOT DETECTED Final   Acinetobacter baumannii NOT DETECTED NOT DETECTED Final   Enterobacteriaceae species NOT DETECTED NOT DETECTED Final   Enterobacter cloacae complex NOT DETECTED NOT DETECTED Final   Escherichia coli NOT DETECTED NOT DETECTED Final   Klebsiella oxytoca NOT DETECTED NOT DETECTED Final   Klebsiella pneumoniae NOT DETECTED NOT DETECTED Final   Proteus species NOT DETECTED NOT DETECTED Final   Serratia marcescens NOT DETECTED NOT DETECTED Final   Haemophilus influenzae NOT DETECTED NOT DETECTED Final   Neisseria meningitidis NOT DETECTED NOT DETECTED Final   Pseudomonas aeruginosa NOT DETECTED NOT DETECTED Final   Candida albicans NOT DETECTED NOT DETECTED Final   Candida glabrata NOT DETECTED NOT DETECTED Final   Candida krusei NOT DETECTED NOT DETECTED Final   Candida parapsilosis NOT DETECTED NOT DETECTED Final   Candida tropicalis NOT DETECTED NOT DETECTED Final  MRSA PCR Screening     Status: Abnormal   Collection Time: 04/29/17  4:56 AM  Result Value Ref Range Status   MRSA by PCR POSITIVE (A) NEGATIVE Final    Comment:        The GeneXpert MRSA Assay (FDA approved for NASAL specimens only), is one component of a comprehensive MRSA colonization surveillance program. It is not intended to diagnose MRSA infection nor to guide or monitor treatment for MRSA infections. RESULT CALLED TO, READ BACK BY AND VERIFIED WITHCandiss Norse RN AT [phone] ON [id number] BY SJW   Culture, Urine     Status: Abnormal   Collection Time: 04/29/17  4:04 PM  Result Value Ref Range Status   Specimen Description  URINE, CATHETERIZED  Final   Special Requests NONE  Final   Culture (A)  Final    20,000 COLONIES/mL ESCHERICHIA COLI 10,000 COLONIES/mL ENTEROCOCCUS FAECALIS    Report Status 05/03/2017 FINAL  Final   Organism ID, Bacteria ENTEROCOCCUS FAECALIS (A)  Final   Organism ID, Bacteria ESCHERICHIA COLI (A)  Final      Susceptibility   Escherichia coli - MIC*    AMPICILLIN >=32 RESISTANT Resistant     CEFAZOLIN <=4 SENSITIVE Sensitive     CEFTRIAXONE <=1 SENSITIVE Sensitive     CIPROFLOXACIN >=4 RESISTANT Resistant     GENTAMICIN <=1 SENSITIVE Sensitive     IMIPENEM <=0.25 SENSITIVE Sensitive     NITROFURANTOIN <=16 SENSITIVE Sensitive     TRIMETH/SULFA >=320 RESISTANT Resistant     AMPICILLIN/SULBACTAM 16 INTERMEDIATE Intermediate     PIP/TAZO <=4 SENSITIVE Sensitive     Extended ESBL NEGATIVE Sensitive     * 20,000 COLONIES/mL ESCHERICHIA COLI   Enterococcus faecalis - MIC*  AMPICILLIN <=2 SENSITIVE Sensitive     LEVOFLOXACIN >=8 RESISTANT Resistant     NITROFURANTOIN <=16 SENSITIVE Sensitive     VANCOMYCIN 1 SENSITIVE Sensitive     * 10,000 COLONIES/mL ENTEROCOCCUS FAECALIS     Time coordinating discharge: Over 30 minutes  SIGNED:   Waldron Session, MD  [facility] 05/03/2017, 10:40 AM Pager   If 7PM-7AM, please contact night-coverage [email or web address] Password [email or web address]

## 2017-05-03 NOTE — Progress Notes (Signed)
Pt discharged to [facility] facility via stretcher per EMT, condition stable, education complete.  Edward Qualia RN

## 2017-05-05 DIAGNOSIS — M6281 Muscle weakness (generalized): Secondary | ICD-10-CM | POA: Diagnosis not present

## 2017-05-05 DIAGNOSIS — A419 Sepsis, unspecified organism: Secondary | ICD-10-CM | POA: Diagnosis not present

## 2017-05-05 DIAGNOSIS — M25511 Pain in right shoulder: Secondary | ICD-10-CM | POA: Diagnosis not present

## 2017-05-05 DIAGNOSIS — L03116 Cellulitis of left lower limb: Secondary | ICD-10-CM | POA: Diagnosis not present

## 2017-05-05 DIAGNOSIS — N390 Urinary tract infection, site not specified: Secondary | ICD-10-CM | POA: Diagnosis not present

## 2017-05-05 DIAGNOSIS — J9620 Acute and chronic respiratory failure, unspecified whether with hypoxia or hypercapnia: Secondary | ICD-10-CM | POA: Diagnosis not present

## 2017-05-06 DIAGNOSIS — M25511 Pain in right shoulder: Secondary | ICD-10-CM | POA: Diagnosis not present

## 2017-05-06 DIAGNOSIS — M6281 Muscle weakness (generalized): Secondary | ICD-10-CM | POA: Diagnosis not present

## 2017-05-06 DIAGNOSIS — I83028 Varicose veins of left lower extremity with ulcer other part of lower leg: Secondary | ICD-10-CM | POA: Diagnosis not present

## 2017-05-06 DIAGNOSIS — A419 Sepsis, unspecified organism: Secondary | ICD-10-CM | POA: Diagnosis not present

## 2017-05-06 DIAGNOSIS — J9620 Acute and chronic respiratory failure, unspecified whether with hypoxia or hypercapnia: Secondary | ICD-10-CM | POA: Diagnosis not present

## 2017-05-06 DIAGNOSIS — L03116 Cellulitis of left lower limb: Secondary | ICD-10-CM | POA: Diagnosis not present

## 2017-05-06 DIAGNOSIS — N390 Urinary tract infection, site not specified: Secondary | ICD-10-CM | POA: Diagnosis not present

## 2017-05-08 DIAGNOSIS — A419 Sepsis, unspecified organism: Secondary | ICD-10-CM | POA: Diagnosis not present

## 2017-05-08 DIAGNOSIS — M6281 Muscle weakness (generalized): Secondary | ICD-10-CM | POA: Diagnosis not present

## 2017-05-08 DIAGNOSIS — J9620 Acute and chronic respiratory failure, unspecified whether with hypoxia or hypercapnia: Secondary | ICD-10-CM | POA: Diagnosis not present

## 2017-05-08 DIAGNOSIS — N390 Urinary tract infection, site not specified: Secondary | ICD-10-CM | POA: Diagnosis not present

## 2017-05-08 DIAGNOSIS — L03116 Cellulitis of left lower limb: Secondary | ICD-10-CM | POA: Diagnosis not present

## 2017-05-08 DIAGNOSIS — M25511 Pain in right shoulder: Secondary | ICD-10-CM | POA: Diagnosis not present

## 2017-05-09 DIAGNOSIS — J9620 Acute and chronic respiratory failure, unspecified whether with hypoxia or hypercapnia: Secondary | ICD-10-CM | POA: Diagnosis not present

## 2017-05-09 DIAGNOSIS — A419 Sepsis, unspecified organism: Secondary | ICD-10-CM | POA: Diagnosis not present

## 2017-05-09 DIAGNOSIS — M25511 Pain in right shoulder: Secondary | ICD-10-CM | POA: Diagnosis not present

## 2017-05-09 DIAGNOSIS — M6281 Muscle weakness (generalized): Secondary | ICD-10-CM | POA: Diagnosis not present

## 2017-05-09 DIAGNOSIS — L03116 Cellulitis of left lower limb: Secondary | ICD-10-CM | POA: Diagnosis not present

## 2017-05-09 DIAGNOSIS — N390 Urinary tract infection, site not specified: Secondary | ICD-10-CM | POA: Diagnosis not present

## 2017-05-13 DIAGNOSIS — I83028 Varicose veins of left lower extremity with ulcer other part of lower leg: Secondary | ICD-10-CM | POA: Diagnosis not present

## 2017-05-13 DIAGNOSIS — N390 Urinary tract infection, site not specified: Secondary | ICD-10-CM | POA: Diagnosis not present

## 2017-05-13 DIAGNOSIS — L03116 Cellulitis of left lower limb: Secondary | ICD-10-CM | POA: Diagnosis not present

## 2017-05-13 DIAGNOSIS — M6281 Muscle weakness (generalized): Secondary | ICD-10-CM | POA: Diagnosis not present

## 2017-05-13 DIAGNOSIS — M25511 Pain in right shoulder: Secondary | ICD-10-CM | POA: Diagnosis not present

## 2017-05-13 DIAGNOSIS — A419 Sepsis, unspecified organism: Secondary | ICD-10-CM | POA: Diagnosis not present

## 2017-05-13 DIAGNOSIS — J9620 Acute and chronic respiratory failure, unspecified whether with hypoxia or hypercapnia: Secondary | ICD-10-CM | POA: Diagnosis not present

## 2017-05-14 DIAGNOSIS — A419 Sepsis, unspecified organism: Secondary | ICD-10-CM | POA: Diagnosis not present

## 2017-05-14 DIAGNOSIS — N390 Urinary tract infection, site not specified: Secondary | ICD-10-CM | POA: Diagnosis not present

## 2017-05-14 DIAGNOSIS — M6281 Muscle weakness (generalized): Secondary | ICD-10-CM | POA: Diagnosis not present

## 2017-05-14 DIAGNOSIS — L0390 Cellulitis, unspecified: Secondary | ICD-10-CM | POA: Diagnosis not present

## 2017-05-15 DIAGNOSIS — L0390 Cellulitis, unspecified: Secondary | ICD-10-CM | POA: Diagnosis not present

## 2017-05-15 DIAGNOSIS — N390 Urinary tract infection, site not specified: Secondary | ICD-10-CM | POA: Diagnosis not present

## 2017-05-15 DIAGNOSIS — A419 Sepsis, unspecified organism: Secondary | ICD-10-CM | POA: Diagnosis not present

## 2017-05-15 DIAGNOSIS — M6281 Muscle weakness (generalized): Secondary | ICD-10-CM | POA: Diagnosis not present

## 2017-05-16 DIAGNOSIS — A419 Sepsis, unspecified organism: Secondary | ICD-10-CM | POA: Diagnosis not present

## 2017-05-16 DIAGNOSIS — N390 Urinary tract infection, site not specified: Secondary | ICD-10-CM | POA: Diagnosis not present

## 2017-05-16 DIAGNOSIS — M6281 Muscle weakness (generalized): Secondary | ICD-10-CM | POA: Diagnosis not present

## 2017-05-16 DIAGNOSIS — L0390 Cellulitis, unspecified: Secondary | ICD-10-CM | POA: Diagnosis not present

## 2017-05-17 DIAGNOSIS — N390 Urinary tract infection, site not specified: Secondary | ICD-10-CM | POA: Diagnosis not present

## 2017-05-17 DIAGNOSIS — L0390 Cellulitis, unspecified: Secondary | ICD-10-CM | POA: Diagnosis not present

## 2017-05-17 DIAGNOSIS — M6281 Muscle weakness (generalized): Secondary | ICD-10-CM | POA: Diagnosis not present

## 2017-05-17 DIAGNOSIS — A419 Sepsis, unspecified organism: Secondary | ICD-10-CM | POA: Diagnosis not present

## 2017-05-19 DIAGNOSIS — L0390 Cellulitis, unspecified: Secondary | ICD-10-CM | POA: Diagnosis not present

## 2017-05-19 DIAGNOSIS — M6281 Muscle weakness (generalized): Secondary | ICD-10-CM | POA: Diagnosis not present

## 2017-05-19 DIAGNOSIS — N390 Urinary tract infection, site not specified: Secondary | ICD-10-CM | POA: Diagnosis not present

## 2017-05-19 DIAGNOSIS — A419 Sepsis, unspecified organism: Secondary | ICD-10-CM | POA: Diagnosis not present

## 2017-05-20 DIAGNOSIS — N390 Urinary tract infection, site not specified: Secondary | ICD-10-CM | POA: Diagnosis not present

## 2017-05-20 DIAGNOSIS — L0390 Cellulitis, unspecified: Secondary | ICD-10-CM | POA: Diagnosis not present

## 2017-05-20 DIAGNOSIS — A419 Sepsis, unspecified organism: Secondary | ICD-10-CM | POA: Diagnosis not present

## 2017-05-20 DIAGNOSIS — I83028 Varicose veins of left lower extremity with ulcer other part of lower leg: Secondary | ICD-10-CM | POA: Diagnosis not present

## 2017-05-20 DIAGNOSIS — M6281 Muscle weakness (generalized): Secondary | ICD-10-CM | POA: Diagnosis not present

## 2017-05-21 DIAGNOSIS — A419 Sepsis, unspecified organism: Secondary | ICD-10-CM | POA: Diagnosis not present

## 2017-05-21 DIAGNOSIS — L0390 Cellulitis, unspecified: Secondary | ICD-10-CM | POA: Diagnosis not present

## 2017-05-21 DIAGNOSIS — M6281 Muscle weakness (generalized): Secondary | ICD-10-CM | POA: Diagnosis not present

## 2017-05-21 DIAGNOSIS — N390 Urinary tract infection, site not specified: Secondary | ICD-10-CM | POA: Diagnosis not present

## 2017-05-22 DIAGNOSIS — A419 Sepsis, unspecified organism: Secondary | ICD-10-CM | POA: Diagnosis not present

## 2017-05-22 DIAGNOSIS — L0390 Cellulitis, unspecified: Secondary | ICD-10-CM | POA: Diagnosis not present

## 2017-05-22 DIAGNOSIS — N390 Urinary tract infection, site not specified: Secondary | ICD-10-CM | POA: Diagnosis not present

## 2017-05-22 DIAGNOSIS — M6281 Muscle weakness (generalized): Secondary | ICD-10-CM | POA: Diagnosis not present

## 2017-05-26 DIAGNOSIS — I83028 Varicose veins of left lower extremity with ulcer other part of lower leg: Secondary | ICD-10-CM | POA: Diagnosis not present

## 2017-05-26 DIAGNOSIS — A419 Sepsis, unspecified organism: Secondary | ICD-10-CM | POA: Diagnosis not present

## 2017-05-26 DIAGNOSIS — M6281 Muscle weakness (generalized): Secondary | ICD-10-CM | POA: Diagnosis not present

## 2017-05-26 DIAGNOSIS — L0390 Cellulitis, unspecified: Secondary | ICD-10-CM | POA: Diagnosis not present

## 2017-05-26 DIAGNOSIS — N390 Urinary tract infection, site not specified: Secondary | ICD-10-CM | POA: Diagnosis not present

## 2017-05-27 DIAGNOSIS — A419 Sepsis, unspecified organism: Secondary | ICD-10-CM | POA: Diagnosis not present

## 2017-05-27 DIAGNOSIS — I5032 Chronic diastolic (congestive) heart failure: Secondary | ICD-10-CM | POA: Diagnosis not present

## 2017-05-27 DIAGNOSIS — N183 Chronic kidney disease, stage 3 (moderate): Secondary | ICD-10-CM | POA: Diagnosis not present

## 2017-05-27 DIAGNOSIS — L0390 Cellulitis, unspecified: Secondary | ICD-10-CM | POA: Diagnosis not present

## 2017-05-27 DIAGNOSIS — N390 Urinary tract infection, site not specified: Secondary | ICD-10-CM | POA: Diagnosis not present

## 2017-05-27 DIAGNOSIS — M6281 Muscle weakness (generalized): Secondary | ICD-10-CM | POA: Diagnosis not present

## 2017-05-27 DIAGNOSIS — E109 Type 1 diabetes mellitus without complications: Secondary | ICD-10-CM | POA: Diagnosis not present

## 2017-05-28 ENCOUNTER — Emergency Department (HOSPITAL_COMMUNITY): Payer: Medicare Other

## 2017-05-28 ENCOUNTER — Inpatient Hospital Stay (HOSPITAL_COMMUNITY)
Admission: EM | Admit: 2017-05-28 | Discharge: 2017-05-31 | DRG: 871 | Disposition: A | Discharge status: Skilled Nursing Facility | Payer: Medicare Other | Attending: Internal Medicine | Admitting: Internal Medicine

## 2017-05-28 ENCOUNTER — Encounter (HOSPITAL_COMMUNITY): Payer: Self-pay | Admitting: Emergency Medicine

## 2017-05-28 DIAGNOSIS — N183 Chronic kidney disease, stage 3 (moderate): Secondary | ICD-10-CM | POA: Diagnosis present

## 2017-05-28 DIAGNOSIS — E1151 Type 2 diabetes mellitus with diabetic peripheral angiopathy without gangrene: Secondary | ICD-10-CM | POA: Diagnosis present

## 2017-05-28 DIAGNOSIS — T8332XA Displacement of intrauterine contraceptive device, initial encounter: Secondary | ICD-10-CM | POA: Diagnosis present

## 2017-05-28 DIAGNOSIS — J9612 Chronic respiratory failure with hypercapnia: Secondary | ICD-10-CM | POA: Diagnosis present

## 2017-05-28 DIAGNOSIS — G92 Toxic encephalopathy: Secondary | ICD-10-CM | POA: Diagnosis present

## 2017-05-28 DIAGNOSIS — D631 Anemia in chronic kidney disease: Secondary | ICD-10-CM | POA: Diagnosis not present

## 2017-05-28 DIAGNOSIS — E1122 Type 2 diabetes mellitus with diabetic chronic kidney disease: Secondary | ICD-10-CM | POA: Diagnosis present

## 2017-05-28 DIAGNOSIS — J9611 Chronic respiratory failure with hypoxia: Secondary | ICD-10-CM | POA: Diagnosis present

## 2017-05-28 DIAGNOSIS — L03115 Cellulitis of right lower limb: Secondary | ICD-10-CM | POA: Diagnosis not present

## 2017-05-28 DIAGNOSIS — R4182 Altered mental status, unspecified: Secondary | ICD-10-CM | POA: Diagnosis not present

## 2017-05-28 DIAGNOSIS — K2100 Gastro-esophageal reflux disease with esophagitis, without bleeding: Secondary | ICD-10-CM | POA: Diagnosis present

## 2017-05-28 DIAGNOSIS — Z89511 Acquired absence of right leg below knee: Secondary | ICD-10-CM

## 2017-05-28 DIAGNOSIS — I5032 Chronic diastolic (congestive) heart failure: Secondary | ICD-10-CM | POA: Diagnosis present

## 2017-05-28 DIAGNOSIS — R6520 Severe sepsis without septic shock: Secondary | ICD-10-CM | POA: Diagnosis not present

## 2017-05-28 DIAGNOSIS — Z888 Allergy status to other drugs, medicaments and biological substances status: Secondary | ICD-10-CM

## 2017-05-28 DIAGNOSIS — I517 Cardiomegaly: Secondary | ICD-10-CM | POA: Diagnosis not present

## 2017-05-28 DIAGNOSIS — R51 Headache: Secondary | ICD-10-CM | POA: Diagnosis present

## 2017-05-28 DIAGNOSIS — R509 Fever, unspecified: Secondary | ICD-10-CM

## 2017-05-28 DIAGNOSIS — G9340 Encephalopathy, unspecified: Secondary | ICD-10-CM | POA: Diagnosis not present

## 2017-05-28 DIAGNOSIS — E1149 Type 2 diabetes mellitus with other diabetic neurological complication: Secondary | ICD-10-CM | POA: Diagnosis present

## 2017-05-28 DIAGNOSIS — A419 Sepsis, unspecified organism: Principal | ICD-10-CM | POA: Diagnosis present

## 2017-05-28 DIAGNOSIS — F329 Major depressive disorder, single episode, unspecified: Secondary | ICD-10-CM | POA: Diagnosis present

## 2017-05-28 DIAGNOSIS — R531 Weakness: Secondary | ICD-10-CM | POA: Diagnosis not present

## 2017-05-28 DIAGNOSIS — E119 Type 2 diabetes mellitus without complications: Secondary | ICD-10-CM | POA: Diagnosis not present

## 2017-05-28 DIAGNOSIS — L03311 Cellulitis of abdominal wall: Secondary | ICD-10-CM | POA: Diagnosis present

## 2017-05-28 DIAGNOSIS — L0390 Cellulitis, unspecified: Secondary | ICD-10-CM | POA: Diagnosis not present

## 2017-05-28 DIAGNOSIS — G4733 Obstructive sleep apnea (adult) (pediatric): Secondary | ICD-10-CM | POA: Diagnosis present

## 2017-05-28 DIAGNOSIS — Z6841 Body Mass Index (BMI) 40.0 and over, adult: Secondary | ICD-10-CM

## 2017-05-28 DIAGNOSIS — E559 Vitamin D deficiency, unspecified: Secondary | ICD-10-CM | POA: Diagnosis present

## 2017-05-28 DIAGNOSIS — I872 Venous insufficiency (chronic) (peripheral): Secondary | ICD-10-CM | POA: Diagnosis present

## 2017-05-28 DIAGNOSIS — Z794 Long term (current) use of insulin: Secondary | ICD-10-CM

## 2017-05-28 DIAGNOSIS — E875 Hyperkalemia: Secondary | ICD-10-CM | POA: Diagnosis not present

## 2017-05-28 DIAGNOSIS — I87312 Chronic venous hypertension (idiopathic) with ulcer of left lower extremity: Secondary | ICD-10-CM | POA: Diagnosis not present

## 2017-05-28 DIAGNOSIS — E1142 Type 2 diabetes mellitus with diabetic polyneuropathy: Secondary | ICD-10-CM | POA: Diagnosis present

## 2017-05-28 DIAGNOSIS — L03116 Cellulitis of left lower limb: Secondary | ICD-10-CM | POA: Diagnosis not present

## 2017-05-28 DIAGNOSIS — E1165 Type 2 diabetes mellitus with hyperglycemia: Secondary | ICD-10-CM | POA: Diagnosis present

## 2017-05-28 DIAGNOSIS — Z9981 Dependence on supplemental oxygen: Secondary | ICD-10-CM | POA: Diagnosis not present

## 2017-05-28 DIAGNOSIS — I7389 Other specified peripheral vascular diseases: Secondary | ICD-10-CM | POA: Diagnosis not present

## 2017-05-28 DIAGNOSIS — R823 Hemoglobinuria: Secondary | ICD-10-CM | POA: Diagnosis present

## 2017-05-28 DIAGNOSIS — M6281 Muscle weakness (generalized): Secondary | ICD-10-CM | POA: Diagnosis not present

## 2017-05-28 DIAGNOSIS — Y762 Prosthetic and other implants, materials and accessory obstetric and gynecological devices associated with adverse incidents: Secondary | ICD-10-CM | POA: Diagnosis present

## 2017-05-28 DIAGNOSIS — Z7982 Long term (current) use of aspirin: Secondary | ICD-10-CM

## 2017-05-28 DIAGNOSIS — L98491 Non-pressure chronic ulcer of skin of other sites limited to breakdown of skin: Secondary | ICD-10-CM | POA: Diagnosis present

## 2017-05-28 DIAGNOSIS — IMO0002 Reserved for concepts with insufficient information to code with codable children: Secondary | ICD-10-CM | POA: Diagnosis present

## 2017-05-28 DIAGNOSIS — J9620 Acute and chronic respiratory failure, unspecified whether with hypoxia or hypercapnia: Secondary | ICD-10-CM | POA: Diagnosis not present

## 2017-05-28 DIAGNOSIS — G4489 Other headache syndrome: Secondary | ICD-10-CM | POA: Diagnosis not present

## 2017-05-28 DIAGNOSIS — R809 Proteinuria, unspecified: Secondary | ICD-10-CM | POA: Diagnosis present

## 2017-05-28 DIAGNOSIS — N390 Urinary tract infection, site not specified: Secondary | ICD-10-CM | POA: Diagnosis not present

## 2017-05-28 DIAGNOSIS — R404 Transient alteration of awareness: Secondary | ICD-10-CM | POA: Diagnosis not present

## 2017-05-28 DIAGNOSIS — J9602 Acute respiratory failure with hypercapnia: Secondary | ICD-10-CM | POA: Diagnosis not present

## 2017-05-28 DIAGNOSIS — A4189 Other specified sepsis: Secondary | ICD-10-CM | POA: Diagnosis not present

## 2017-05-28 DIAGNOSIS — F32A Depression, unspecified: Secondary | ICD-10-CM | POA: Diagnosis present

## 2017-05-28 DIAGNOSIS — I130 Hypertensive heart and chronic kidney disease with heart failure and stage 1 through stage 4 chronic kidney disease, or unspecified chronic kidney disease: Secondary | ICD-10-CM | POA: Diagnosis present

## 2017-05-28 DIAGNOSIS — G8929 Other chronic pain: Secondary | ICD-10-CM | POA: Diagnosis not present

## 2017-05-28 DIAGNOSIS — K439 Ventral hernia without obstruction or gangrene: Secondary | ICD-10-CM | POA: Diagnosis not present

## 2017-05-28 DIAGNOSIS — Z7951 Long term (current) use of inhaled steroids: Secondary | ICD-10-CM

## 2017-05-28 DIAGNOSIS — N281 Cyst of kidney, acquired: Secondary | ICD-10-CM | POA: Diagnosis present

## 2017-05-28 DIAGNOSIS — K219 Gastro-esophageal reflux disease without esophagitis: Secondary | ICD-10-CM | POA: Diagnosis present

## 2017-05-28 DIAGNOSIS — M25511 Pain in right shoulder: Secondary | ICD-10-CM | POA: Diagnosis not present

## 2017-05-28 LAB — BLOOD GAS, VENOUS
Acid-base deficit: 5.2 mmol/L — ABNORMAL HIGH (ref 0.0–2.0)
Bicarbonate: 20 mmol/L (ref 20.0–28.0)
O2 Content: 2 L/min
O2 Saturation: 14.9 %
PH VEN: 7.272 (ref 7.250–7.430)
Patient temperature: 103.6
pCO2, Ven: 46.8 mmHg (ref 44.0–60.0)

## 2017-05-28 LAB — URINALYSIS, ROUTINE W REFLEX MICROSCOPIC
Bacteria, UA: NONE SEEN
Bilirubin Urine: NEGATIVE
GLUCOSE, UA: NEGATIVE mg/dL
HGB URINE DIPSTICK: NEGATIVE
Ketones, ur: 5 mg/dL — AB
Leukocytes, UA: NEGATIVE
NITRITE: NEGATIVE
PH: 7 (ref 5.0–8.0)
Protein, ur: >=300 mg/dL — AB
SPECIFIC GRAVITY, URINE: 1.016 (ref 1.005–1.030)

## 2017-05-28 LAB — CBC WITH DIFFERENTIAL/PLATELET
Basophils Absolute: 0 10*3/uL (ref 0.0–0.1)
Basophils Relative: 0 %
EOS ABS: 0 10*3/uL (ref 0.0–0.7)
Eosinophils Relative: 0 %
HCT: 39.1 % (ref 36.0–46.0)
Hemoglobin: 12.8 g/dL (ref 12.0–15.0)
Lymphocytes Relative: 9 %
Lymphs Abs: 1.2 10*3/uL (ref 0.7–4.0)
MCH: 28.8 pg (ref 26.0–34.0)
MCHC: 32.7 g/dL (ref 30.0–36.0)
MCV: 87.9 fL (ref 78.0–100.0)
MONO ABS: 1.1 10*3/uL — AB (ref 0.1–1.0)
MONOS PCT: 9 %
Neutro Abs: 10.4 10*3/uL — ABNORMAL HIGH (ref 1.7–7.7)
Neutrophils Relative %: 82 %
PLATELETS: 195 10*3/uL (ref 150–400)
RBC: 4.45 MIL/uL (ref 3.87–5.11)
RDW: 16.5 % — AB (ref 11.5–15.5)
WBC: 12.8 10*3/uL — ABNORMAL HIGH (ref 4.0–10.5)

## 2017-05-28 LAB — I-STAT CG4 LACTIC ACID, ED
LACTIC ACID, VENOUS: 1.27 mmol/L (ref 0.5–1.9)
LACTIC ACID, VENOUS: 0.73 mmol/L (ref 0.5–1.9)

## 2017-05-28 LAB — COMPREHENSIVE METABOLIC PANEL
ALBUMIN: 3.1 g/dL — AB (ref 3.5–5.0)
ALT: 27 U/L (ref 14–54)
ANION GAP: 8 (ref 5–15)
AST: 33 U/L (ref 15–41)
Alkaline Phosphatase: 64 U/L (ref 38–126)
BUN: 14 mg/dL (ref 6–20)
CALCIUM: 9.2 mg/dL (ref 8.9–10.3)
CHLORIDE: 99 mmol/L — AB (ref 101–111)
CO2: 30 mmol/L (ref 22–32)
CREATININE: 1.08 mg/dL — AB (ref 0.44–1.00)
GFR calc non Af Amer: 50 mL/min — ABNORMAL LOW (ref >60)
GFR, EST AFRICAN AMERICAN: 58 mL/min — AB (ref >60)
GLUCOSE: 156 mg/dL — AB (ref 65–99)
Potassium: 4.2 mmol/L (ref 3.5–5.1)
SODIUM: 137 mmol/L (ref 135–145)
TOTAL PROTEIN: 8.2 g/dL — AB (ref 6.5–8.1)
Total Bilirubin: 1.2 mg/dL (ref 0.3–1.2)

## 2017-05-28 MED ORDER — DEXAMETHASONE SODIUM PHOSPHATE 10 MG/ML IJ SOLN
10.0000 mg | Freq: Once | INTRAMUSCULAR | Status: AC
  Administered 2017-05-28: 10 mg via INTRAVENOUS
  Filled 2017-05-28: qty 1

## 2017-05-28 MED ORDER — SODIUM CHLORIDE 0.9 % IV BOLUS (SEPSIS)
1000.0000 mL | Freq: Once | INTRAVENOUS | Status: AC
  Administered 2017-05-28: 1000 mL via INTRAVENOUS

## 2017-05-28 MED ORDER — VANCOMYCIN HCL IN DEXTROSE 1-5 GM/200ML-% IV SOLN
1000.0000 mg | Freq: Once | INTRAVENOUS | Status: DC
  Filled 2017-05-28: qty 200

## 2017-05-28 MED ORDER — PIPERACILLIN-TAZOBACTAM 3.375 G IVPB 30 MIN
3.3750 g | Freq: Once | INTRAVENOUS | Status: DC
  Administered 2017-05-28: 3.375 g via INTRAVENOUS
  Filled 2017-05-28: qty 50

## 2017-05-28 MED ORDER — ACETAMINOPHEN 325 MG PO TABS
650.0000 mg | ORAL_TABLET | Freq: Once | ORAL | Status: AC
  Administered 2017-05-28: 650 mg via ORAL
  Filled 2017-05-28: qty 2

## 2017-05-28 MED ORDER — VANCOMYCIN HCL 10 G IV SOLR
2500.0000 mg | Freq: Once | INTRAVENOUS | Status: AC
  Administered 2017-05-28: 2500 mg via INTRAVENOUS
  Filled 2017-05-28: qty 2500

## 2017-05-28 MED ORDER — KETOROLAC TROMETHAMINE 30 MG/ML IJ SOLN
30.0000 mg | Freq: Once | INTRAMUSCULAR | Status: AC
  Administered 2017-05-29: 30 mg via INTRAVENOUS
  Filled 2017-05-28: qty 1

## 2017-05-28 MED ORDER — LIDOCAINE HCL (PF) 1 % IJ SOLN
5.0000 mL | Freq: Once | INTRAMUSCULAR | Status: AC
  Administered 2017-05-28: 10 mL
  Filled 2017-05-28: qty 30

## 2017-05-28 MED ORDER — DEXTROSE 5 % IV SOLN
2.0000 g | Freq: Once | INTRAVENOUS | Status: AC
  Administered 2017-05-28: 2 g via INTRAVENOUS
  Filled 2017-05-28: qty 2

## 2017-05-28 MED ORDER — DEXTROSE 5 % IV SOLN: 2.0000 g | Freq: Once | INTRAVENOUS | Status: DC

## 2017-05-28 NOTE — Progress Notes (Signed)
RN can call report to Threasa Beards at 00:20 at [phone]
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
## 2017-05-28 NOTE — ED Notes (Signed)
Bed: WA21 Expected date:  Expected time:  Means of arrival:  Comments: EMS/shob 

## 2017-05-28 NOTE — ED Triage Notes (Signed)
Pt comes from [facility] via EMS after they were called out for SOB and agonal breathing.  Pt was not experiencing any distress on arrival but did complain of generalized weakness and not feeling well.  On 2 L [place] chronic and uses CPAP at night.  Pt covered in urine on arrival.  Right BKA.  Left lower leg wrapped in coban.  Pt A&O x4.

## 2017-05-28 NOTE — Progress Notes (Signed)
A consult was received from an ED physician for Vancomycin and Zosyn per pharmacy dosing for sepsis.  The patient's profile has been reviewed for ht/wt/allergies/indication/available labs.   A one time order has been placed for Vancomycin 2500mg  IV and Zosyn 3.375g IV (30 minute infusion).  Further antibiotics/pharmacy consults should be ordered by admitting physician if indicated.                       Thank you,  Lindell Spar, PharmD, BCPS Pager: [phone] 05/28/2017 7:15 PM

## 2017-05-29 ENCOUNTER — Inpatient Hospital Stay (HOSPITAL_COMMUNITY): Payer: Medicare Other

## 2017-05-29 DIAGNOSIS — G9340 Encephalopathy, unspecified: Secondary | ICD-10-CM

## 2017-05-29 LAB — BASIC METABOLIC PANEL
ANION GAP: 8 (ref 5–15)
BUN: 16 mg/dL (ref 6–20)
CALCIUM: 8.3 mg/dL — AB (ref 8.9–10.3)
CO2: 27 mmol/L (ref 22–32)
CREATININE: 1.3 mg/dL — AB (ref 0.44–1.00)
Chloride: 104 mmol/L (ref 101–111)
GFR, EST AFRICAN AMERICAN: 46 mL/min — AB (ref >60)
GFR, EST NON AFRICAN AMERICAN: 40 mL/min — AB (ref >60)
Glucose, Bld: 196 mg/dL — ABNORMAL HIGH (ref 65–99)
Potassium: 4 mmol/L (ref 3.5–5.1)
SODIUM: 139 mmol/L (ref 135–145)

## 2017-05-29 LAB — GLUCOSE, CAPILLARY
GLUCOSE-CAPILLARY: 159 mg/dL — AB (ref 65–99)
Glucose-Capillary: 165 mg/dL — ABNORMAL HIGH (ref 65–99)
Glucose-Capillary: 159 mg/dL — ABNORMAL HIGH (ref 65–99)

## 2017-05-29 LAB — MAGNESIUM: MAGNESIUM: 1 mg/dL — AB (ref 1.7–2.4)

## 2017-05-29 LAB — CBC WITH DIFFERENTIAL/PLATELET
BASOS ABS: 0 10*3/uL (ref 0.0–0.1)
BASOS PCT: 0 %
EOS ABS: 0 10*3/uL (ref 0.0–0.7)
Eosinophils Relative: 0 %
HEMATOCRIT: 37.2 % (ref 36.0–46.0)
HEMOGLOBIN: 11.9 g/dL — AB (ref 12.0–15.0)
Lymphocytes Relative: 4 %
Lymphs Abs: 0.5 10*3/uL — ABNORMAL LOW (ref 0.7–4.0)
MCH: 27.9 pg (ref 26.0–34.0)
MCHC: 32 g/dL (ref 30.0–36.0)
MCV: 87.1 fL (ref 78.0–100.0)
MONO ABS: 0.6 10*3/uL (ref 0.1–1.0)
Monocytes Relative: 5 %
NEUTROS ABS: 11.8 10*3/uL — AB (ref 1.7–7.7)
NEUTROS PCT: 91 %
Platelets: 155 10*3/uL (ref 150–400)
RBC: 4.27 MIL/uL (ref 3.87–5.11)
RDW: 16.5 % — AB (ref 11.5–15.5)
WBC: 12.9 10*3/uL — ABNORMAL HIGH (ref 4.0–10.5)

## 2017-05-29 LAB — AMMONIA: Ammonia: 18 umol/L (ref 9–35)

## 2017-05-29 MED ORDER — IOPAMIDOL (ISOVUE-300) INJECTION 61%
INTRAVENOUS | Status: AC
  Administered 2017-05-29: 15 mL
  Filled 2017-05-29: qty 30

## 2017-05-29 MED ORDER — ENOXAPARIN SODIUM 60 MG/0.6ML ~~LOC~~ SOLN
60.0000 mg | SUBCUTANEOUS | Status: DC
  Administered 2017-05-29 – 2017-05-31 (×3): 60 mg via SUBCUTANEOUS
  Filled 2017-05-29 (×3): qty 0.6

## 2017-05-29 MED ORDER — MAGNESIUM SULFATE 2 GM/50ML IV SOLN
2.0000 g | Freq: Once | INTRAVENOUS | Status: AC
  Administered 2017-05-29: 2 g via INTRAVENOUS
  Filled 2017-05-29: qty 50

## 2017-05-29 MED ORDER — INSULIN ASPART 100 UNIT/ML ~~LOC~~ SOLN
0.0000 [IU] | Freq: Three times a day (TID) | SUBCUTANEOUS | Status: DC
  Administered 2017-05-29 – 2017-05-30 (×2): 3 [IU] via SUBCUTANEOUS
  Administered 2017-05-30: 5 [IU] via SUBCUTANEOUS
  Administered 2017-05-30: 2 [IU] via SUBCUTANEOUS
  Administered 2017-05-31: 3 [IU] via SUBCUTANEOUS
  Administered 2017-05-31: 2 [IU] via SUBCUTANEOUS

## 2017-05-29 MED ORDER — IOPAMIDOL (ISOVUE-300) INJECTION 61%
INTRAVENOUS | Status: AC
  Filled 2017-05-29: qty 100

## 2017-05-29 MED ORDER — DEXTROSE 5 % IV SOLN
2.0000 g | Freq: Two times a day (BID) | INTRAVENOUS | Status: DC
  Administered 2017-05-29: 2 g via INTRAVENOUS
  Filled 2017-05-29: qty 2

## 2017-05-29 MED ORDER — ONDANSETRON HCL 4 MG PO TABS: 4.0000 mg | ORAL_TABLET | Freq: Four times a day (QID) | ORAL | Status: DC | PRN

## 2017-05-29 MED ORDER — ACETAMINOPHEN 325 MG PO TABS
650.0000 mg | ORAL_TABLET | Freq: Four times a day (QID) | ORAL | Status: DC | PRN
  Administered 2017-05-29 – 2017-05-31 (×2): 650 mg via ORAL
  Filled 2017-05-29 (×2): qty 2

## 2017-05-29 MED ORDER — VANCOMYCIN HCL 10 G IV SOLR
1500.0000 mg | INTRAVENOUS | Status: DC
  Administered 2017-05-29: 1500 mg via INTRAVENOUS
  Filled 2017-05-29: qty 1500

## 2017-05-29 MED ORDER — ASPIRIN EC 81 MG PO TBEC
81.0000 mg | DELAYED_RELEASE_TABLET | Freq: Every day | ORAL | Status: DC
  Administered 2017-05-29 – 2017-05-31 (×3): 81 mg via ORAL
  Filled 2017-05-29 (×3): qty 1

## 2017-05-29 MED ORDER — AMPICILLIN SODIUM 2 G IJ SOLR
2.0000 g | Freq: Once | INTRAMUSCULAR | Status: AC
Start: 2017-05-29 — End: 2017-05-29
  Administered 2017-05-29: 2 g via INTRAVENOUS
  Filled 2017-05-29: qty 2000

## 2017-05-29 MED ORDER — SODIUM CHLORIDE 0.45 % IV SOLN
INTRAVENOUS | Status: DC
  Administered 2017-05-29 (×2): via INTRAVENOUS

## 2017-05-29 MED ORDER — INSULIN ASPART 100 UNIT/ML ~~LOC~~ SOLN: 0.0000 [IU] | Freq: Every day | SUBCUTANEOUS | Status: DC

## 2017-05-29 MED ORDER — IOPAMIDOL (ISOVUE-300) INJECTION 61%: 15.0000 mL | Freq: Once | INTRAVENOUS | Status: DC | PRN

## 2017-05-29 MED ORDER — DULOXETINE HCL 20 MG PO CPEP
40.0000 mg | ORAL_CAPSULE | Freq: Every day | ORAL | Status: DC
  Administered 2017-05-29 – 2017-05-31 (×3): 40 mg via ORAL
  Filled 2017-05-29 (×3): qty 2

## 2017-05-29 MED ORDER — GABAPENTIN 300 MG PO CAPS
600.0000 mg | ORAL_CAPSULE | Freq: Two times a day (BID) | ORAL | Status: DC
  Administered 2017-05-29 – 2017-05-31 (×5): 600 mg via ORAL
  Filled 2017-05-29 (×5): qty 2

## 2017-05-29 MED ORDER — DEXTROSE 5 % IV SOLN
2.0000 g | INTRAVENOUS | Status: DC
  Filled 2017-05-29: qty 2

## 2017-05-29 MED ORDER — SODIUM CHLORIDE 0.9 % IV SOLN: 1750.0000 mg | INTRAVENOUS | Status: DC

## 2017-05-29 MED ORDER — IOPAMIDOL (ISOVUE-300) INJECTION 61%
100.0000 mL | Freq: Once | INTRAVENOUS | Status: AC | PRN
  Administered 2017-05-29: 100 mL via INTRAVENOUS

## 2017-05-29 MED ORDER — SODIUM CHLORIDE 0.9 % IV SOLN
2.0000 g | INTRAVENOUS | Status: DC
  Administered 2017-05-29 (×2): 2 g via INTRAVENOUS
  Filled 2017-05-29 (×3): qty 2000

## 2017-05-29 MED ORDER — ONDANSETRON HCL 4 MG/2ML IJ SOLN
4.0000 mg | Freq: Four times a day (QID) | INTRAMUSCULAR | Status: DC | PRN
  Administered 2017-05-29: 4 mg via INTRAVENOUS
  Filled 2017-05-29: qty 2

## 2017-05-29 NOTE — Progress Notes (Signed)
PT Cancellation Note  Patient Details Name: Heather Sheard. Zimmerman MRN: [id number] DOB: Jan 22, 1946   Cancelled Treatment:    Reason Eval/Treat Not Completed: PT screened, no needs identified, will sign off. Reviewed chart. Pt is a long term SNF resident who requires hoyer lift for OOB to [place]. Will defer PT eval to SNF. Recommend return to SNF.    Weston Anna, MPT Pager: [phone]

## 2017-05-29 NOTE — Care Management Note (Signed)
Case Management Note  Patient Details  Name: Heather Zimmerman MRN: [id number] Date of Birth: 1945/11/11  Subjective/Objective: 71 y/o f admitted w/Sepsis. From [facility]-SNF. CSW following. PT/cons.                   Action/Plan:d/c SNF.   Expected Discharge Date:   (UNKNOWN)               Expected Discharge Plan:  Skilled Nursing Facility  In-House Referral:  Clinical Social Work  Discharge planning Services  CM Consult  Post Acute Care Choice:    Choice offered to:     DME Arranged:    DME Agency:     HH Arranged:    [place] Agency:     Status of Service:  In process, will continue to follow  If discussed at Long Length of Stay Meetings, dates discussed:    Additional Comments:  Dessa Phi, RN 05/29/2017, 12:25 PM

## 2017-05-29 NOTE — Progress Notes (Signed)
To CT

## 2017-05-29 NOTE — Progress Notes (Signed)
PROGRESS NOTE    Heather Zimmerman. [name]  JYN:[id number] DOB: 03/30/1946 DOA: 05/28/2017 PCP: Patient, No Pcp Per     Brief Narrative:  Heather Zimmerman. Ravelo is a 71 y.o. female with medical history significant of chronic diastolic heart failure, chronic pain, stage III chronic kidney disease, type 2 diabetes, right BKA, essential hypertension, GERD, obstructive sleep apnea on CPAP, venous insufficiency with venous status ulcer, vitamin D deficiency who was transferred from her facility due to shortness of breath and AMS. Apparently EMS was called out due to dyspnea and the patient having agonal breathing. When EMS arrived to the facility, the patient was not in any distress, but complaining of generalized weakness and not feeling well. Her clothes were soaking urine on arrival. Due to patient's fever and AMS, LP was attempted in the ED which was unsuccessful due to patient body habitus. Sepsis source was either possible meningitis vs abdominal wall cellulitis. She was started on vanco, ceftriaxone, ampicillin.   Assessment & Plan:   Principal Problem:   Sepsis ([place]) Active Problems:   Depression   Chronic diastolic heart failure (HCC)   GERD   OSA treated with BiPAP   Acute encephalopathy   Chronic respiratory failure with hypercapnia (HCC)   Type II diabetes mellitus with neurological manifestations, uncontrolled (HCC)  Acute toxic/metabolic encephalopathy -CT head: No acute intracranial abnormality. -Ammonia 18 -Improved. Oriented x 3, answers questions appropriately  Sepsis secondary to abdominal wall cellulitis -Initially concern for meningitis. However, there are no focal deficits, no nuchal rigidity. LP would be difficult in the morbid obese patient. Body mass index is 63.07 kg/m. Suspicion for meningitis is low -She does have abdominal wall erythema, induration, warm to touch -Treat with vanco, ceftriaxone -Blood culture pending  -UA unremarkable for UTI  -CXR unremarkable for PNA    -Will obtain CT abd/pelvis to r/o underlying abscess  Chronic diastolic heart failure  -Stable without exacerbation  Chronic hypoxemic respiratory failure -Baseline 2L O2   CKD stage 3 -Baseline Cr 1.2-1.3 -Stable   Type II diabetes mellitus -SSI   Depression -Continue Cymbalta  OSA -Continue on BiPAP bedtime  Hypomagnesemia -Replace, trend   Morbid obesity -Body mass index is 63.07 kg/m.   DVT prophylaxis: lovenox Code Status: full Family Communication: no family at bedside Disposition Plan: pending improvement, back to SNF    Consultants:   None  Procedures:   LP by EDP 8/15, unsuccessful due to patient body habitus   Antimicrobials:  Anti-infectives    Start     Dose/Rate Route Frequency Ordered Stop   05/30/17 1000  cefTRIAXone (ROCEPHIN) 2 g in dextrose 5 % 50 mL IVPB     2 g 100 mL/hr over 30 Minutes Intravenous Every 24 hours 05/29/17 1035     05/29/17 2000  vancomycin (VANCOCIN) 1,750 mg in sodium chloride 0.9 % 500 mL IVPB  Status:  Discontinued     1,750 mg 250 mL/hr over 120 Minutes Intravenous Every 24 hours 05/29/17 0353 05/29/17 1035   05/29/17 2000  vancomycin (VANCOCIN) 1,500 mg in sodium chloride 0.9 % 500 mL IVPB     1,500 mg 250 mL/hr over 120 Minutes Intravenous Every 24 hours 05/29/17 1035     05/29/17 1000  cefTRIAXone (ROCEPHIN) 2 g in dextrose 5 % 50 mL IVPB  Status:  Discontinued     2 g 100 mL/hr over 30 Minutes Intravenous Every 12 hours 05/29/17 0353 05/29/17 1035   05/29/17 0400  ampicillin (OMNIPEN) 2 g in sodium chloride 0.9 %  50 mL IVPB  Status:  Discontinued     2 g 150 mL/hr over 20 Minutes Intravenous Every 4 hours 05/29/17 0353 05/29/17 0942   05/29/17 0030  ampicillin (OMNIPEN) 2 g in sodium chloride 0.9 % 50 mL IVPB     2 g 150 mL/hr over 20 Minutes Intravenous  Once 05/29/17 0026 05/29/17 0133   05/28/17 2015  cefTRIAXone (ROCEPHIN) 2 g in dextrose 5 % 50 mL IVPB     2 g 100 mL/hr over 30 Minutes Intravenous   Once 05/28/17 2001 05/28/17 2302   05/28/17 2000  ceFEPIme (MAXIPIME) 2 g in dextrose 5 % 50 mL IVPB  Status:  Discontinued     2 g 100 mL/hr over 30 Minutes Intravenous  Once 05/28/17 1947 05/28/17 2001   05/28/17 1915  piperacillin-tazobactam (ZOSYN) IVPB 3.375 g  Status:  Discontinued     3.375 g 100 mL/hr over 30 Minutes Intravenous  Once 05/28/17 1907 05/28/17 2005   05/28/17 1915  vancomycin (VANCOCIN) IVPB 1000 mg/200 mL premix  Status:  Discontinued     1,000 mg 200 mL/hr over 60 Minutes Intravenous  Once 05/28/17 1907 05/28/17 1913   05/28/17 1915  vancomycin (VANCOCIN) 2,500 mg in sodium chloride 0.9 % 500 mL IVPB     2,500 mg 250 mL/hr over 120 Minutes Intravenous  Once 05/28/17 1913 05/28/17 2156       Subjective: Patient awake and alert this morning. She is oriented to person, hospital, year. She is not exactly sure why she ended up in the hospital, does not recall history of prior to admission. Admits to generalized headache, but no neck stiffness or neck tenderness with flexion.   Objective: Vitals:   05/29/17 0045 05/29/17 0245 05/29/17 0540 05/29/17 0825  BP: 124/80 (!) 147/85 131/62   Pulse:  76 81   Resp: (!) 23  20   Temp:  99.2 F (37.3 C) 97.9 F (36.6 C)   TempSrc:  Oral Oral   SpO2:  98% 99%   Weight:    132.2 kg (291 lb 7.2 oz)  Height:    4\' 9"  (1.448 m)    Intake/Output Summary (Last 24 hours) at 05/29/17 1325 Last data filed at 05/29/17 1038  Gross per 24 hour  Intake           183.33 ml  Output             1000 ml  Net          -816.67 ml   Filed Weights   05/28/17 1903 05/29/17 0825  Weight: (!) 149.7 kg (330 lb) 132.2 kg (291 lb 7.2 oz)    Examination:  General exam: Appears calm and comfortable  Respiratory system: Clear to auscultation. Respiratory effort normal. Cardiovascular system: S1 & S2 heard, RRR. No JVD, murmurs, rubs, gallops or clicks. No pedal edema. Gastrointestinal system: Abdomen is nondistended, soft and nontender. No  organomegaly or masses felt. Normal bowel sounds heard. Obese.  Central nervous system: Alert and oriented. No focal neurological deficits. Speech somewhat garbled  Extremities: Right BKA, L leg with chronic skin changes  Skin: +LLQ abdominal wall cellulitis with erythema, warmth, induration  Psychiatry: Judgement and insight appear normal. Mood & affect appropriate.   Data Reviewed: I have personally reviewed following labs and imaging studies  CBC:  Recent Labs Lab 05/28/17 1907 05/29/17 0515  WBC 12.8* 12.9*  NEUTROABS 10.4* 11.8*  HGB 12.8 11.9*  HCT 39.1 37.2  MCV 87.9 87.1  PLT  195 409   Basic Metabolic Panel:  Recent Labs Lab 05/28/17 1907 05/29/17 0002 05/29/17 0515  NA 137  --  139  K 4.2  --  4.0  CL 99*  --  104  CO2 30  --  27  GLUCOSE 156*  --  196*  BUN 14  --  16  CREATININE 1.08*  --  1.30*  CALCIUM 9.2  --  8.3*  MG  --  1.0*  --    GFR: Estimated Creatinine Clearance: 46.9 mL/min (A) (by C-G formula based on SCr of 1.3 mg/dL (H)). Liver Function Tests:  Recent Labs Lab 05/28/17 1907  AST 33  ALT 27  ALKPHOS 64  BILITOT 1.2  PROT 8.2*  ALBUMIN 3.1*   No results for input(s): LIPASE, AMYLASE in the last 168 hours.  Recent Labs Lab 05/29/17 0120  AMMONIA 18   Coagulation Profile: No results for input(s): INR, PROTIME in the last 168 hours. Cardiac Enzymes: No results for input(s): CKTOTAL, CKMB, CKMBINDEX, TROPONINI in the last 168 hours. BNP (last 3 results) No results for input(s): PROBNP in the last 8760 hours. HbA1C: No results for input(s): HGBA1C in the last 72 hours. CBG: No results for input(s): GLUCAP in the last 168 hours. Lipid Profile: No results for input(s): CHOL, HDL, LDLCALC, TRIG, CHOLHDL, LDLDIRECT in the last 72 hours. Thyroid Function Tests: No results for input(s): TSH, T4TOTAL, FREET4, T3FREE, THYROIDAB in the last 72 hours. Anemia Panel: No results for input(s): VITAMINB12, FOLATE, FERRITIN, TIBC, IRON,  RETICCTPCT in the last 72 hours. Sepsis Labs:  Recent Labs Lab 05/28/17 1931 05/28/17 2320  LATICACIDVEN 1.27 0.73    Recent Results (from the past 240 hour(s))  Blood Culture (routine x 2)     Status: None (Preliminary result)   Collection Time: 05/28/17  7:07 PM  Result Value Ref Range Status   Specimen Description   Final    BLOOD LEFT ANTECUBITAL Blood Culture adequate volume   Special Requests BOTTLES DRAWN AEROBIC AND ANAEROBIC  Final   Culture   Final    NO GROWTH < 12 HOURS Performed at [facility], [place] [address] [place], [place] [address]    Report Status PENDING  Incomplete  Blood Culture (routine x 2)     Status: None (Preliminary result)   Collection Time: 05/28/17  7:32 PM  Result Value Ref Range Status   Specimen Description BLOOD RIGHT HAND  Final   Special Requests IN PEDIATRIC BOTTLE Blood Culture adequate volume  Final   Culture   Final    NO GROWTH < 12 HOURS Performed at [facility], [place] [address], [place], [place] [address]    Report Status PENDING  Incomplete       Radiology Studies: Ct Head Wo Contrast  Result Date: 05/28/2017 CLINICAL DATA:  Altered mental status.  Generalized weakness. EXAM: CT HEAD WITHOUT CONTRAST TECHNIQUE: Contiguous axial images were obtained from the base of the skull through the vertex without intravenous contrast. COMPARISON:  Head CT 12/28/2016 FINDINGS: Brain: No intracranial hemorrhage, mass effect, or midline shift. No hydrocephalus. The basilar cisterns are patent. No evidence of territorial infarct. No extra-axial or intracranial fluid collection. Vascular: Atherosclerosis of skullbase vasculature without hyperdense vessel or abnormal calcification. Skull: The skull fracture or focal lesion. Sinuses/Orbits: Chronic opacification of right maxillary sinus. Remote right orbital fracture. Mastoid air cells are clear. Other: Mild motion artifact. IMPRESSION: No acute intracranial abnormality. Electronically  Signed   By: Threasa Beards  Ehinger M.D.   On: 05/28/2017 21:36   Dg Chest Port 1 View  Result Date: 05/28/2017 CLINICAL DATA:  Fevers. EXAM: PORTABLE CHEST 1 VIEW COMPARISON:  04/30/2017 FINDINGS: There is moderate cardiac enlargement. There is no pleural effusion or edema. No airspace opacities. IMPRESSION: 1. Cardiac enlargement.  No acute findings. Electronically Signed   By: Kerby Moors M.D.   On: 05/28/2017 20:14      Scheduled Meds: . aspirin EC  81 mg Oral Daily  . DULoxetine  40 mg Oral Daily  . enoxaparin (LOVENOX) injection  60 mg Subcutaneous Q24H  . gabapentin  600 mg Oral BID  . insulin aspart  0-15 Units Subcutaneous TID WC  . insulin aspart  0-5 Units Subcutaneous QHS   Continuous Infusions: . [START ON 05/30/2017] cefTRIAXone (ROCEPHIN)  IV    . magnesium sulfate 1 - 4 g bolus IVPB    . vancomycin       LOS: 1 day    Time spent: 40 minutes   Dessa Phi, DO [facility] [email or web address] Password [email or web address] 05/29/2017, 1:25 PM

## 2017-05-29 NOTE — Progress Notes (Signed)
Lovenox per Pharmacy for DVT Prophylaxis    Pharmacy has been consulted from dosing enoxaparin (lovenox) in this patient for DVT prophylaxis.  The pharmacist has reviewed pertinent labs (Hgb _12.8__; PLT_195__), patient weight (_149__kg) and renal function (CrCl_61__mL/min) and decided that enoxaparin _60_mg SQ Q24Hrs is appropriate for this patient.  The pharmacy department will sign off at this time.  Please reconsult pharmacy if status changes or for further issues.  Thank you  Cyndia Diver PharmD, BCPS  05/29/2017, 2:43 AM

## 2017-05-29 NOTE — Progress Notes (Signed)
Pharmacy Antibiotic Note  Jihan Mellette. Gironda is a 71 y.o. female admitted on 05/28/2017 with meningitis.  Pharmacy has been consulted for vancomycin, ampicillin, ceftriaxone dosing.  Plan: Ampicillin 2gm iv q4hr Ceftriaxone 2gm iv q12hr Vancomycin 2500mg  iv x1, then 1750mg  iv q24hr   Weight: (!) 330 lb (149.7 kg)  Temp (24hrs), Avg:102 F (38.9 C), Min:99.2 F (37.3 C), Max:103.6 F (39.8 C)   Recent Labs Lab 05/28/17 1907 05/28/17 1931 05/28/17 2320  WBC 12.8*  --   --   CREATININE 1.08*  --   --   LATICACIDVEN  --  1.27 0.73    Estimated Creatinine Clearance: 61.7 mL/min (A) (by C-G formula based on SCr of 1.08 mg/dL (H)).    Allergies  Allergen Reactions  . Ace Inhibitors Other (See Comments)    unknown    Antimicrobials this admission: Vancomycin 8/15 >> Ceftriaxone 8/15 >> Ampicillin 8/16 >> Zosyn 8/15 x1  Dose adjustments this admission: -  Microbiology results: pending  Thank you for allowing pharmacy to be a part of this patient's care.  Nani Skillern Crowford 05/29/2017 3:56 AM
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
## 2017-05-29 NOTE — Progress Notes (Signed)
Pharmacy Antibiotic Note  Heather Zimmerman is a 71 y.o. female admitted on 05/28/2017 with altered mental status and sepsis.  Ampicillin, Ceftriaxone, Vancomycin started empirically for r/o meningitis.  TRH evaluated patient today and identified likely source of infection as abdominal cellulitis.  No signs of meningitis.    Plan: Per discussion with MD, will discontinue ampicillin.  Adjust Ceftriaxone 2gm IV q24h Adjust vancomycin to 1500 mg IV q24h due to update in patient's weight from ED weight (149 kg --> 132 kg). VT goal 10-15 mcg/ml.  Height: 4\' 9"  (144.8 cm) Weight: 291 lb 7.2 oz (132.2 kg) IBW/kg (Calculated) : 38.6  Temp (24hrs), Avg:101 F (38.3 C), Min:97.9 F (36.6 C), Max:103.6 F (39.8 C)   Recent Labs Lab 05/28/17 1907 05/28/17 1931 05/28/17 2320 05/29/17 0515  WBC 12.8*  --   --  12.9*  CREATININE 1.08*  --   --  1.30*  LATICACIDVEN  --  1.27 0.73  --     Estimated Creatinine Clearance: 46.9 mL/min (A) (by C-G formula based on SCr of 1.3 mg/dL (H)).    Allergies  Allergen Reactions  . Ace Inhibitors Other (See Comments)    unknown    Antimicrobials this admission: Vancomycin 8/15 >> Ceftriaxone 8/15 >> Ampicillin 8/16 >> 8/16 Zosyn 8/15 x1  Dose adjustments this admission: -  Microbiology results: pending  Thank you for allowing pharmacy to be a part of this patient's care.  Hershal Coria 05/29/2017 10:25 AM

## 2017-05-30 DIAGNOSIS — A419 Sepsis, unspecified organism: Principal | ICD-10-CM

## 2017-05-30 LAB — CBC WITH DIFFERENTIAL/PLATELET
BASOS ABS: 0 10*3/uL (ref 0.0–0.1)
BASOS PCT: 0 %
EOS ABS: 0.1 10*3/uL (ref 0.0–0.7)
Eosinophils Relative: 1 %
HEMATOCRIT: 33.7 % — AB (ref 36.0–46.0)
Hemoglobin: 10.6 g/dL — ABNORMAL LOW (ref 12.0–15.0)
Lymphocytes Relative: 12 %
Lymphs Abs: 1.6 10*3/uL (ref 0.7–4.0)
MCH: 28 pg (ref 26.0–34.0)
MCHC: 31.5 g/dL (ref 30.0–36.0)
MCV: 88.9 fL (ref 78.0–100.0)
MONO ABS: 1.3 10*3/uL — AB (ref 0.1–1.0)
Monocytes Relative: 9 %
NEUTROS ABS: 10.3 10*3/uL — AB (ref 1.7–7.7)
NEUTROS PCT: 78 %
Platelets: 179 10*3/uL (ref 150–400)
RBC: 3.79 MIL/uL — ABNORMAL LOW (ref 3.87–5.11)
RDW: 17.1 % — AB (ref 11.5–15.5)
WBC: 13.3 10*3/uL — ABNORMAL HIGH (ref 4.0–10.5)

## 2017-05-30 LAB — BASIC METABOLIC PANEL
ANION GAP: 8 (ref 5–15)
BUN: 24 mg/dL — ABNORMAL HIGH (ref 6–20)
CALCIUM: 8.7 mg/dL — AB (ref 8.9–10.3)
CHLORIDE: 103 mmol/L (ref 101–111)
CO2: 28 mmol/L (ref 22–32)
Creatinine, Ser: 1.36 mg/dL — ABNORMAL HIGH (ref 0.44–1.00)
GFR calc non Af Amer: 38 mL/min — ABNORMAL LOW (ref >60)
GFR, EST AFRICAN AMERICAN: 44 mL/min — AB (ref >60)
Glucose, Bld: 116 mg/dL — ABNORMAL HIGH (ref 65–99)
Potassium: 4.1 mmol/L (ref 3.5–5.1)
SODIUM: 139 mmol/L (ref 135–145)

## 2017-05-30 LAB — GLUCOSE, CAPILLARY
GLUCOSE-CAPILLARY: 140 mg/dL — AB (ref 65–99)
GLUCOSE-CAPILLARY: 151 mg/dL — AB (ref 65–99)
GLUCOSE-CAPILLARY: 172 mg/dL — AB (ref 65–99)
Glucose-Capillary: 222 mg/dL — ABNORMAL HIGH (ref 65–99)
Glucose-Capillary: 150 mg/dL — ABNORMAL HIGH (ref 65–99)

## 2017-05-30 LAB — URINE CULTURE: Culture: NO GROWTH

## 2017-05-30 LAB — MRSA PCR SCREENING: MRSA by PCR: POSITIVE — AB

## 2017-05-30 LAB — MAGNESIUM: MAGNESIUM: 2.2 mg/dL (ref 1.7–2.4)

## 2017-05-30 MED ORDER — MUPIROCIN 2 % EX OINT
1.0000 "application " | TOPICAL_OINTMENT | Freq: Two times a day (BID) | CUTANEOUS | Status: DC
  Administered 2017-05-30 – 2017-05-31 (×2): 1 via NASAL
  Filled 2017-05-30 (×2): qty 22

## 2017-05-30 MED ORDER — DOXYCYCLINE HYCLATE 100 MG PO TABS
100.0000 mg | ORAL_TABLET | Freq: Two times a day (BID) | ORAL | Status: DC
  Administered 2017-05-30 – 2017-05-31 (×3): 100 mg via ORAL
  Filled 2017-05-30 (×3): qty 1

## 2017-05-30 MED ORDER — CHLORHEXIDINE GLUCONATE CLOTH 2 % EX PADS
6.0000 | MEDICATED_PAD | Freq: Every day | CUTANEOUS | Status: DC
  Administered 2017-05-31: 6 via TOPICAL

## 2017-05-30 NOTE — Progress Notes (Signed)
PROGRESS NOTE    Heather Zimmerman. [name]  UEA:[id number] DOB: 1946/09/11 DOA: 05/28/2017 PCP: Patient, No Pcp Per     Brief Narrative:  Heather Zimmerman. Heather Zimmerman is a 71 y.o. female with medical history significant of chronic diastolic heart failure, chronic pain, stage III chronic kidney disease, type 2 diabetes, right BKA, essential hypertension, GERD, obstructive sleep apnea on CPAP, venous insufficiency with venous status ulcer, vitamin D deficiency who was transferred from her facility due to shortness of breath and AMS. Apparently EMS was called out due to dyspnea and the patient having agonal breathing. When EMS arrived to the facility, the patient was not in any distress, but complaining of generalized weakness and not feeling well. Her clothes were soaking urine on arrival. Due to patient's fever and AMS, LP was attempted in the ED which was unsuccessful due to patient body habitus. Sepsis source was either possible meningitis vs abdominal wall cellulitis. She was started on vanco, ceftriaxone, ampicillin. Due to low suspicion for meningitis and another source of infection to explain sepsis, ampicillin was discontinued. Patient's mentation improved.   Assessment & Plan:   Principal Problem:   Sepsis ([place]) Active Problems:   Depression   Chronic diastolic heart failure (HCC)   GERD   OSA treated with BiPAP   Acute encephalopathy   Chronic respiratory failure with hypercapnia (HCC)   Type II diabetes mellitus with neurological manifestations, uncontrolled (HCC)  Acute toxic/metabolic encephalopathy -CT head: No acute intracranial abnormality. -Ammonia 18 -Improved. Oriented x 3, answers questions appropriately  Sepsis secondary to abdominal wall cellulitis -Initially concern for meningitis. However, there are no focal deficits, no nuchal rigidity. LP would be difficult in the morbid obese patient. Body mass index is 63.5 kg/m. Suspicion for meningitis is low -She does have abdominal wall  erythema, induration, warm to touch -Blood culture pending  -Urine culture negative  -CXR unremarkable for PNA  -CT abd/pelvis without acute findings, no abscess  -Previously tolerated doxycycline for cellulitis, deescalate antibiotics today   Chronic diastolic heart failure  -Stable without exacerbation  Chronic hypoxemic respiratory failure -Baseline 2L O2   CKD stage 3 -Baseline Cr 1.2-1.3 -Stable   Type II diabetes mellitus -SSI   Depression -Continue Cymbalta  OSA -Continue on BiPAP bedtime  Left kidney cyst -Recommend outpatient MRI abdomen with and without contrast to better characterize finding  Malpositioned IUD -Patient states that she had IUD placed about 5 years ago due to fibroids, vaginal bleeding. CT abdomen and pelvis revealed malpositioned IUD with tip likely slightly protruding from the cervical os. She needs to follow-up with her gynecologist.  Morbid obesity -Body mass index is 63.5 kg/m.   DVT prophylaxis: lovenox Code Status: full Family Communication: no family at bedside Disposition Plan: pending improvement, back to SNF. Will keep one more day due to continued leukocytosis    Consultants:   None  Procedures:   LP by EDP 8/15, unsuccessful due to patient body habitus   Antimicrobials:  Anti-infectives    Start     Dose/Rate Route Frequency Ordered Stop   05/30/17 1000  cefTRIAXone (ROCEPHIN) 2 g in dextrose 5 % 50 mL IVPB  Status:  Discontinued     2 g 100 mL/hr over 30 Minutes Intravenous Every 24 hours 05/29/17 1035 05/30/17 1000   05/30/17 1000  doxycycline (VIBRA-TABS) tablet 100 mg     100 mg Oral Every 12 hours 05/30/17 0959     05/29/17 2000  vancomycin (VANCOCIN) 1,750 mg in sodium chloride 0.9 % 500  mL IVPB  Status:  Discontinued     1,750 mg 250 mL/hr over 120 Minutes Intravenous Every 24 hours 05/29/17 0353 05/29/17 1035   05/29/17 2000  vancomycin (VANCOCIN) 1,500 mg in sodium chloride 0.9 % 500 mL IVPB  Status:   Discontinued     1,500 mg 250 mL/hr over 120 Minutes Intravenous Every 24 hours 05/29/17 1035 05/30/17 1000   05/29/17 1000  cefTRIAXone (ROCEPHIN) 2 g in dextrose 5 % 50 mL IVPB  Status:  Discontinued     2 g 100 mL/hr over 30 Minutes Intravenous Every 12 hours 05/29/17 0353 05/29/17 1035   05/29/17 0400  ampicillin (OMNIPEN) 2 g in sodium chloride 0.9 % 50 mL IVPB  Status:  Discontinued     2 g 150 mL/hr over 20 Minutes Intravenous Every 4 hours 05/29/17 0353 05/29/17 0942   05/29/17 0030  ampicillin (OMNIPEN) 2 g in sodium chloride 0.9 % 50 mL IVPB     2 g 150 mL/hr over 20 Minutes Intravenous  Once 05/29/17 0026 05/29/17 0133   05/28/17 2015  cefTRIAXone (ROCEPHIN) 2 g in dextrose 5 % 50 mL IVPB     2 g 100 mL/hr over 30 Minutes Intravenous  Once 05/28/17 2001 05/28/17 2302   05/28/17 2000  ceFEPIme (MAXIPIME) 2 g in dextrose 5 % 50 mL IVPB  Status:  Discontinued     2 g 100 mL/hr over 30 Minutes Intravenous  Once 05/28/17 1947 05/28/17 2001   05/28/17 1915  piperacillin-tazobactam (ZOSYN) IVPB 3.375 g  Status:  Discontinued     3.375 g 100 mL/hr over 30 Minutes Intravenous  Once 05/28/17 1907 05/28/17 2005   05/28/17 1915  vancomycin (VANCOCIN) IVPB 1000 mg/200 mL premix  Status:  Discontinued     1,000 mg 200 mL/hr over 60 Minutes Intravenous  Once 05/28/17 1907 05/28/17 1913   05/28/17 1915  vancomycin (VANCOCIN) 2,500 mg in sodium chloride 0.9 % 500 mL IVPB     2,500 mg 250 mL/hr over 120 Minutes Intravenous  Once 05/28/17 1913 05/28/17 2156       Subjective: Patient awake and alert this morning. No new complaints today. States that headache is better today compared to yesterday. No other complaints, has been afebrile. No chest pain or shortness of breath.   Objective: Vitals:   05/29/17 2253 05/30/17 0640 05/30/17 1301 05/30/17 1303  BP: (!) 149/68 (!) 113/52  (!) 109/50  Pulse: 81 72  78  Resp: 20 18  18   Temp: 99 F (37.2 C) 98.6 F (37 C)  98.2 F (36.8 C)    TempSrc: Oral Oral  Oral  SpO2: 100% 100%  100%  Weight:   133.1 kg (293 lb 6.9 oz)   Height:        Intake/Output Summary (Last 24 hours) at 05/30/17 1332 Last data filed at 05/30/17 1229  Gross per 24 hour  Intake          2449.58 ml  Output              550 ml  Net          1899.58 ml   Filed Weights   05/28/17 1903 05/29/17 0825 05/30/17 1301  Weight: (!) 149.7 kg (330 lb) 132.2 kg (291 lb 7.2 oz) 133.1 kg (293 lb 6.9 oz)    Examination:  General exam: Appears calm and comfortable  Respiratory system: Clear to auscultation. Respiratory effort normal. On nasal CPAP  Cardiovascular system: S1 & S2 heard, RRR.  No JVD, murmurs, rubs, gallops or clicks. No pedal edema. Gastrointestinal system: Abdomen is nondistended, soft and nontender. No organomegaly or masses felt. Normal bowel sounds heard. Obese.  Central nervous system: Alert and oriented. No focal neurological deficits. Speech somewhat garbled  Extremities: Right BKA, Left leg with chronic skin changes  Skin: +LLQ abdominal wall cellulitis with erythema, warmth, induration, erythema has improved since my last examination Psychiatry: Judgement and insight appear normal. Mood & affect appropriate.   Data Reviewed: I have personally reviewed following labs and imaging studies  CBC:  Recent Labs Lab 05/28/17 1907 05/29/17 0515 05/30/17 1031  WBC 12.8* 12.9* 13.3*  NEUTROABS 10.4* 11.8* 10.3*  HGB 12.8 11.9* 10.6*  HCT 39.1 37.2 33.7*  MCV 87.9 87.1 88.9  PLT 195 155 630   Basic Metabolic Panel:  Recent Labs Lab 05/28/17 1907 05/29/17 0002 05/29/17 0515 05/30/17 0931  NA 137  --  139 139  K 4.2  --  4.0 4.1  CL 99*  --  104 103  CO2 30  --  27 28  GLUCOSE 156*  --  196* 116*  BUN 14  --  16 24*  CREATININE 1.08*  --  1.30* 1.36*  CALCIUM 9.2  --  8.3* 8.7*  MG  --  1.0*  --  2.2   GFR: Estimated Creatinine Clearance: 45.1 mL/min (A) (by C-G formula based on SCr of 1.36 mg/dL (H)). Liver Function  Tests:  Recent Labs Lab 05/28/17 1907  AST 33  ALT 27  ALKPHOS 64  BILITOT 1.2  PROT 8.2*  ALBUMIN 3.1*   No results for input(s): LIPASE, AMYLASE in the last 168 hours.  Recent Labs Lab 05/29/17 0120  AMMONIA 18   Coagulation Profile: No results for input(s): INR, PROTIME in the last 168 hours. Cardiac Enzymes: No results for input(s): CKTOTAL, CKMB, CKMBINDEX, TROPONINI in the last 168 hours. BNP (last 3 results) No results for input(s): PROBNP in the last 8760 hours. HbA1C: No results for input(s): HGBA1C in the last 72 hours. CBG:  Recent Labs Lab 05/29/17 1631 05/29/17 2239 05/30/17 0059 05/30/17 0752 05/30/17 1139  GLUCAP 159* 159* 172* 140* 151*   Lipid Profile: No results for input(s): CHOL, HDL, LDLCALC, TRIG, CHOLHDL, LDLDIRECT in the last 72 hours. Thyroid Function Tests: No results for input(s): TSH, T4TOTAL, FREET4, T3FREE, THYROIDAB in the last 72 hours. Anemia Panel: No results for input(s): VITAMINB12, FOLATE, FERRITIN, TIBC, IRON, RETICCTPCT in the last 72 hours. Sepsis Labs:  Recent Labs Lab 05/28/17 1931 05/28/17 2320  LATICACIDVEN 1.27 0.73    Recent Results (from the past 240 hour(s))  Blood Culture (routine x 2)     Status: None (Preliminary result)   Collection Time: 05/28/17  7:07 PM  Result Value Ref Range Status   Specimen Description   Final    BLOOD LEFT ANTECUBITAL Blood Culture adequate volume   Special Requests BOTTLES DRAWN AEROBIC AND ANAEROBIC  Final   Culture   Final    NO GROWTH < 12 HOURS Performed at [facility], [place] [address] [place], [place] [address]    Report Status PENDING  Incomplete  Blood Culture (routine x 2)     Status: None (Preliminary result)   Collection Time: 05/28/17  7:32 PM  Result Value Ref Range Status   Specimen Description BLOOD RIGHT HAND  Final   Special Requests IN PEDIATRIC BOTTLE Blood Culture adequate volume  Final   Culture   Final    NO GROWTH <  12 HOURS Performed at  [facility], [place] [address] [place], [place] [address]    Report Status PENDING  Incomplete  Urine culture     Status: None   Collection Time: 05/28/17  7:50 PM  Result Value Ref Range Status   Specimen Description URINE, CLEAN CATCH  Final   Special Requests NONE  Final   Culture   Final    NO GROWTH Performed at [facility], [place] [address] [place], [place] [address]    Report Status 05/30/2017 FINAL  Final       Radiology Studies: Ct Head Wo Contrast  Result Date: 05/28/2017 CLINICAL DATA:  Altered mental status.  Generalized weakness. EXAM: CT HEAD WITHOUT CONTRAST TECHNIQUE: Contiguous axial images were obtained from the base of the skull through the vertex without intravenous contrast. COMPARISON:  Head CT 12/28/2016 FINDINGS: Brain: No intracranial hemorrhage, mass effect, or midline shift. No hydrocephalus. The basilar cisterns are patent. No evidence of territorial infarct. No extra-axial or intracranial fluid collection. Vascular: Atherosclerosis of skullbase vasculature without hyperdense vessel or abnormal calcification. Skull: The skull fracture or focal lesion. Sinuses/Orbits: Chronic opacification of right maxillary sinus. Remote right orbital fracture. Mastoid air cells are clear. Other: Mild motion artifact. IMPRESSION: No acute intracranial abnormality. Electronically Signed   By: Jeb Levering M.D.   On: 05/28/2017 21:36   Ct Abdomen Pelvis W Contrast  Result Date: 05/29/2017 CLINICAL DATA:  71 year old female with history of sepsis and altered mental status. EXAM: CT ABDOMEN AND PELVIS WITH CONTRAST TECHNIQUE: Multidetector CT imaging of the abdomen and pelvis was performed using the standard protocol following bolus administration of intravenous contrast. CONTRAST:  153mL ISOVUE-300 IOPAMIDOL (ISOVUE-300) INJECTION 61% COMPARISON:  No prior CT the abdomen and pelvis. Chest CT 11/12/2016. FINDINGS: Lower chest: Previously noted thickening of the minor  fissure is less bulky than the prior study, currently measuring 10 x 3 mm (coronal image 41 of series 5), presumably a subpleural lymph node. Cardiomegaly. Calcifications of the aortic valve. Hepatobiliary: No suspicious cystic or solid hepatic lesions. No intra or extrahepatic biliary ductal dilatation. Gallbladder is unremarkable in appearance. Pancreas: No pancreatic mass. No pancreatic ductal dilatation. No pancreatic or peripancreatic fluid or inflammatory changes. Spleen: Unremarkable. Adrenals/Urinary Tract: 3.4 cm intermediate attenuation lesion (27 HU) in the medial aspect of the interpolar region of the left kidney. Other low-attenuation lesions are noted in the kidneys bilaterally, compatible with simple cysts, the largest of which measures 3.6 cm in the lower pole the right kidney. No hydroureteronephrosis. Bilateral perinephric stranding (nonspecific). Urinary bladder is normal in appearance. Stomach/Bowel: The appearance of the stomach is normal. No pathologic dilatation of small bowel or colon. Normal appendix. Vascular/Lymphatic: Aortic atherosclerosis without definite aneurysm or dissection in the abdominal or pelvic vasculature. Several mildly enlarged partially calcified lymph nodes are noted in the pelvis bilaterally measuring up to 12 mm in short axis in the right external iliac nodal station, nonspecific, but favored to be benign. Reproductive: IUD appears malpositioned in the lower uterine segment, likely with the tip extending beyond the cervical os. Exophytic partially calcified lesion in the left side of the uterine body measuring 4.6 x 3.6 cm, presumably a calcified fibroid. Ovaries are atrophic. Other: Small supraumbilical ventral hernia containing only omental fat. No significant volume of ascites. No pneumoperitoneum. Musculoskeletal: 12 mm densely calcified lesion in the left ilium with narrow zone of transition, presumably a bone island. There are no other aggressive appearing lytic  or blastic lesions noted  in the visualized portions of the skeleton. IMPRESSION: 1. No acute findings are noted in the abdomen or pelvis. Specifically, no definite source for patient's infection is identified. 2. Small supraumbilical ventral hernia containing only omental fat. No signs of associated bowel incarceration or obstruction at this time. 3. Indeterminate lesion in the interpolar region of the left kidney. This may simply represent a proteinaceous cyst, however, follow-up evaluation with nonemergent MRI of the abdomen with and without IV gadolinium is suggested in the near future to better characterize this finding. 4. Malpositioned IUD which is in a low position, with the tip likely slightly protruding beyond the cervical os. Consider removal in this postmenopausal patient. 5. Aortic atherosclerosis. 6. There are calcifications of the aortic valve. Echocardiographic correlation for evaluation of potential valvular dysfunction may be warranted if clinically indicated. 7. Mild cardiomegaly. 8. Additional incidental findings, as above. Electronically Signed   By: Vinnie Langton M.D.   On: 05/29/2017 20:29   Dg Chest Port 1 View  Result Date: 05/28/2017 CLINICAL DATA:  Fevers. EXAM: PORTABLE CHEST 1 VIEW COMPARISON:  04/30/2017 FINDINGS: There is moderate cardiac enlargement. There is no pleural effusion or edema. No airspace opacities. IMPRESSION: 1. Cardiac enlargement.  No acute findings. Electronically Signed   By: Kerby Moors M.D.   On: 05/28/2017 20:14      Scheduled Meds: . aspirin EC  81 mg Oral Daily  . doxycycline  100 mg Oral Q12H  . DULoxetine  40 mg Oral Daily  . enoxaparin (LOVENOX) injection  60 mg Subcutaneous Q24H  . gabapentin  600 mg Oral BID  . insulin aspart  0-15 Units Subcutaneous TID WC  . insulin aspart  0-5 Units Subcutaneous QHS   Continuous Infusions:    LOS: 2 days    Time spent: 30 minutes   Dessa Phi, DO [facility] [email or web address] Password [email or web address] 05/30/2017, 1:32 PM

## 2017-05-30 NOTE — NC FL2 (Signed)
[place] LEVEL OF CARE SCREENING TOOL     IDENTIFICATION  Patient Name: Heather Zimmerman. Depace Birthdate: 15-Dec-1945 Sex: female Admission Date (Current Location): 05/28/2017  [place] and [place] Number:  Herbalist and Address:  [facility], [place] [address] [place], [place] [address]      Provider Number: [id number]  Attending Physician Name and Address:  Dessa Phi Chahn-Yan*  Relative Name and Phone Number:       Current Level of Care: Hospital Recommended Level of Care: [place] Prior Approval Number:    Date Approved/Denied:   PASRR Number:    Discharge Plan: SNF    Current Diagnoses: Patient Active Problem List   Diagnosis Date Noted  . Sepsis secondary to UTI ([place]) 04/29/2017  . Acute on chronic respiratory failure with hypoxia and hypercapnia ([place]) 04/29/2017  . Acute metabolic encephalopathy 09/62/8366  . Dyslipidemia associated with type 2 diabetes mellitus ([place]) 03/03/2017  . Positive D dimer   . Altered mental status 12/28/2016  . Acute renal failure superimposed on stage 3 chronic kidney disease ([place])   . Cellulitis of left thigh 09/23/2016  . UTI (urinary tract infection) 09/23/2016  . Oropharyngeal dysphagia   . Coag negative Staphylococcus bacteremia   . Chronic venous stasis dermatitis   . Weakness of right upper extremity 07/09/2016  . Type II diabetes mellitus with neurological manifestations, uncontrolled ([place]) 04/23/2016  . Ventral hernia without obstruction or gangrene 02/24/2016  . Chronic respiratory failure with hypercapnia ([place]) 02/24/2016  . Occult blood in stools 12/26/2015  . Hypoxemia   . AKI (acute kidney injury) ([place])   . Hypertensive heart disease with congestive heart failure and stage 3 kidney disease ([place]) 07/24/2015  . CKD (chronic kidney disease) stage 3, GFR 30-59 ml/min 07/21/2015  . Cellulitis of left lower extremity 07/19/2015  . Acute encephalopathy  07/18/2015  . Open wound of left lower extremity 07/18/2015  . Sepsis ([place]) 07/18/2015  . Venous ulcer of left leg ([place]) 12/27/2014  . Hx of right BKA ([place]) 11/22/2014  . Peripheral autonomic neuropathy due to diabetes mellitus ([place]) 10/09/2014  . OSA treated with BiPAP 07/05/2014  . Acute on chronic respiratory failure with hypercapnia ([place]) 06/21/2014  . Morbid obesity ([place]) 08/30/2013  . Unspecified vitamin D deficiency 12/31/2012  . Disorder of magnesium metabolism 12/31/2012  . Depression 12/31/2012  . Chronic pain 12/31/2012  . Chronic diastolic heart failure ([place]) 12/31/2012  . GERD 12/31/2012    Orientation RESPIRATION BLADDER Height & Weight     Self  O2 (3L [place]) Incontinent Weight: 291 lb 7.2 oz (132.2 kg) Height:  4\' 9"  (144.8 cm)  BEHAVIORAL SYMPTOMS/MOOD NEUROLOGICAL BOWEL NUTRITION STATUS      Continent Diet (carb modified, cardiac)  AMBULATORY STATUS COMMUNICATION OF NEEDS Skin   Extensive Assist Verbally Normal                       Personal Care Assistance Level of Assistance  Bathing, Dressing, Feeding Bathing Assistance: Maximum assistance Feeding assistance: Limited assistance Dressing Assistance: Maximum assistance     Functional Limitations Info             SPECIAL CARE FACTORS FREQUENCY                       Contractures      Additional Factors Info  Code Status, Allergies, Psychotropic, Insulin Sliding Scale, Isolation Precautions Code Status Info: FULL  Allergies Info: ace inhibitors Psychotropic Info: cymbalta Insulin Sliding Scale Info: 4/day Isolation Precautions Info: MRSA     Current Medications (05/30/2017):  This is the current hospital active medication list Current Facility-Administered Medications  Medication Dose Route Frequency Provider Last Rate Last Dose  . acetaminophen (TYLENOL) tablet 650 mg  650 mg Oral Q6H PRN Dessa Phi Chahn-Yang, DO   650 mg at 05/29/17 1740  . aspirin EC tablet 81 mg  81 mg Oral  Daily Dessa Phi Chahn-Yang, DO   81 mg at 05/30/17 1059  . doxycycline (VIBRA-TABS) tablet 100 mg  100 mg Oral Q12H Dessa Phi Chahn-Yang, DO   100 mg at 05/30/17 1059  . DULoxetine (CYMBALTA) DR capsule 40 mg  40 mg Oral Daily Dessa Phi Chahn-Yang, DO   40 mg at 05/30/17 1058  . enoxaparin (LOVENOX) injection 60 mg  60 mg Subcutaneous Q24H Reubin Milan, MD   60 mg at 05/30/17 1059  . gabapentin (NEURONTIN) capsule 600 mg  600 mg Oral BID Dessa Phi Chahn-Yang, DO   600 mg at 05/30/17 1058  . insulin aspart (novoLOG) injection 0-15 Units  0-15 Units Subcutaneous TID WC Dessa Phi Chahn-Yang, DO   2 Units at 05/30/17 0813  . insulin aspart (novoLOG) injection 0-5 Units  0-5 Units Subcutaneous QHS Dessa Phi Chahn-Yang, DO      . iopamidol (ISOVUE-300) 61 % injection 15 mL  15 mL Oral Once PRN Dessa Phi Chahn-Yang, DO      . ondansetron [facility]) tablet 4 mg  4 mg Oral Q6H PRN Reubin Milan, MD       Or  . ondansetron [facility]) injection 4 mg  4 mg Intravenous Q6H PRN Reubin Milan, MD   4 mg at 05/29/17 1205     Discharge Medications: Please see discharge summary for a list of discharge medications.  Relevant Imaging Results:  Relevant Lab Results:   Additional Information SS#: [id number]  Jorge Ny, LCSW

## 2017-05-30 NOTE — Progress Notes (Signed)
Per MD note plan for patient DC tomorrow- facility updated and can accept over the weekend if stable  CSW will continue to follow  Jorge Ny, [place] Social Worker [phone]

## 2017-05-30 NOTE — Progress Notes (Signed)
CRITICAL VALUE ALERT  Critical Value:  MRSA positive  Date & Time Notied:  05/30/2017 10:03 PM   Provider Notified: 10:03 PM   Orders Received/Actions taken: On call NP Lynch notified.

## 2017-05-31 DIAGNOSIS — G4700 Insomnia, unspecified: Secondary | ICD-10-CM | POA: Diagnosis not present

## 2017-05-31 DIAGNOSIS — L0390 Cellulitis, unspecified: Secondary | ICD-10-CM | POA: Diagnosis not present

## 2017-05-31 DIAGNOSIS — I87312 Chronic venous hypertension (idiopathic) with ulcer of left lower extremity: Secondary | ICD-10-CM | POA: Diagnosis not present

## 2017-05-31 DIAGNOSIS — J9602 Acute respiratory failure with hypercapnia: Secondary | ICD-10-CM | POA: Diagnosis not present

## 2017-05-31 DIAGNOSIS — I83028 Varicose veins of left lower extremity with ulcer other part of lower leg: Secondary | ICD-10-CM | POA: Diagnosis not present

## 2017-05-31 DIAGNOSIS — M25511 Pain in right shoulder: Secondary | ICD-10-CM | POA: Diagnosis not present

## 2017-05-31 DIAGNOSIS — E119 Type 2 diabetes mellitus without complications: Secondary | ICD-10-CM | POA: Diagnosis not present

## 2017-05-31 DIAGNOSIS — J9620 Acute and chronic respiratory failure, unspecified whether with hypoxia or hypercapnia: Secondary | ICD-10-CM | POA: Diagnosis not present

## 2017-05-31 DIAGNOSIS — N183 Chronic kidney disease, stage 3 (moderate): Secondary | ICD-10-CM | POA: Diagnosis not present

## 2017-05-31 DIAGNOSIS — I130 Hypertensive heart and chronic kidney disease with heart failure and stage 1 through stage 4 chronic kidney disease, or unspecified chronic kidney disease: Secondary | ICD-10-CM | POA: Diagnosis not present

## 2017-05-31 DIAGNOSIS — G4489 Other headache syndrome: Secondary | ICD-10-CM | POA: Diagnosis not present

## 2017-05-31 DIAGNOSIS — A419 Sepsis, unspecified organism: Secondary | ICD-10-CM | POA: Diagnosis not present

## 2017-05-31 DIAGNOSIS — R6520 Severe sepsis without septic shock: Secondary | ICD-10-CM | POA: Diagnosis not present

## 2017-05-31 DIAGNOSIS — R300 Dysuria: Secondary | ICD-10-CM | POA: Diagnosis not present

## 2017-05-31 DIAGNOSIS — R5383 Other fatigue: Secondary | ICD-10-CM | POA: Diagnosis not present

## 2017-05-31 DIAGNOSIS — G4733 Obstructive sleep apnea (adult) (pediatric): Secondary | ICD-10-CM | POA: Diagnosis not present

## 2017-05-31 DIAGNOSIS — R51 Headache: Secondary | ICD-10-CM | POA: Diagnosis not present

## 2017-05-31 DIAGNOSIS — M6281 Muscle weakness (generalized): Secondary | ICD-10-CM | POA: Diagnosis not present

## 2017-05-31 DIAGNOSIS — A4189 Other specified sepsis: Secondary | ICD-10-CM | POA: Diagnosis not present

## 2017-05-31 DIAGNOSIS — J9621 Acute and chronic respiratory failure with hypoxia: Secondary | ICD-10-CM | POA: Diagnosis not present

## 2017-05-31 DIAGNOSIS — I10 Essential (primary) hypertension: Secondary | ICD-10-CM | POA: Diagnosis not present

## 2017-05-31 DIAGNOSIS — E785 Hyperlipidemia, unspecified: Secondary | ICD-10-CM | POA: Diagnosis not present

## 2017-05-31 DIAGNOSIS — R6889 Other general symptoms and signs: Secondary | ICD-10-CM | POA: Diagnosis not present

## 2017-05-31 DIAGNOSIS — E1141 Type 2 diabetes mellitus with diabetic mononeuropathy: Secondary | ICD-10-CM | POA: Diagnosis not present

## 2017-05-31 DIAGNOSIS — Z794 Long term (current) use of insulin: Secondary | ICD-10-CM | POA: Diagnosis not present

## 2017-05-31 DIAGNOSIS — G9340 Encephalopathy, unspecified: Secondary | ICD-10-CM | POA: Diagnosis not present

## 2017-05-31 DIAGNOSIS — F329 Major depressive disorder, single episode, unspecified: Secondary | ICD-10-CM | POA: Diagnosis not present

## 2017-05-31 DIAGNOSIS — Z7982 Long term (current) use of aspirin: Secondary | ICD-10-CM | POA: Diagnosis not present

## 2017-05-31 DIAGNOSIS — I5032 Chronic diastolic (congestive) heart failure: Secondary | ICD-10-CM | POA: Diagnosis not present

## 2017-05-31 DIAGNOSIS — I70242 Atherosclerosis of native arteries of left leg with ulceration of calf: Secondary | ICD-10-CM | POA: Diagnosis not present

## 2017-05-31 DIAGNOSIS — R4182 Altered mental status, unspecified: Secondary | ICD-10-CM | POA: Diagnosis not present

## 2017-05-31 DIAGNOSIS — F419 Anxiety disorder, unspecified: Secondary | ICD-10-CM | POA: Diagnosis not present

## 2017-05-31 DIAGNOSIS — K219 Gastro-esophageal reflux disease without esophagitis: Secondary | ICD-10-CM | POA: Diagnosis not present

## 2017-05-31 DIAGNOSIS — N390 Urinary tract infection, site not specified: Secondary | ICD-10-CM | POA: Diagnosis not present

## 2017-05-31 DIAGNOSIS — L03311 Cellulitis of abdominal wall: Secondary | ICD-10-CM | POA: Diagnosis not present

## 2017-05-31 DIAGNOSIS — B351 Tinea unguium: Secondary | ICD-10-CM | POA: Diagnosis not present

## 2017-05-31 DIAGNOSIS — D649 Anemia, unspecified: Secondary | ICD-10-CM | POA: Diagnosis not present

## 2017-05-31 DIAGNOSIS — D631 Anemia in chronic kidney disease: Secondary | ICD-10-CM | POA: Diagnosis not present

## 2017-05-31 DIAGNOSIS — I7389 Other specified peripheral vascular diseases: Secondary | ICD-10-CM | POA: Diagnosis not present

## 2017-05-31 DIAGNOSIS — Z79899 Other long term (current) drug therapy: Secondary | ICD-10-CM | POA: Diagnosis not present

## 2017-05-31 DIAGNOSIS — L03116 Cellulitis of left lower limb: Secondary | ICD-10-CM | POA: Diagnosis not present

## 2017-05-31 DIAGNOSIS — I872 Venous insufficiency (chronic) (peripheral): Secondary | ICD-10-CM | POA: Diagnosis not present

## 2017-05-31 DIAGNOSIS — Z89511 Acquired absence of right leg below knee: Secondary | ICD-10-CM | POA: Diagnosis not present

## 2017-05-31 DIAGNOSIS — L03115 Cellulitis of right lower limb: Secondary | ICD-10-CM | POA: Diagnosis not present

## 2017-05-31 DIAGNOSIS — G8929 Other chronic pain: Secondary | ICD-10-CM | POA: Diagnosis not present

## 2017-05-31 DIAGNOSIS — R404 Transient alteration of awareness: Secondary | ICD-10-CM | POA: Diagnosis not present

## 2017-05-31 DIAGNOSIS — E1122 Type 2 diabetes mellitus with diabetic chronic kidney disease: Secondary | ICD-10-CM | POA: Diagnosis not present

## 2017-05-31 LAB — CBC WITH DIFFERENTIAL/PLATELET
Basophils Absolute: 0 10*3/uL (ref 0.0–0.1)
Basophils Relative: 0 %
Eosinophils Absolute: 0.5 10*3/uL (ref 0.0–0.7)
Eosinophils Relative: 6 %
HEMATOCRIT: 36.7 % (ref 36.0–46.0)
Hemoglobin: 11.5 g/dL — ABNORMAL LOW (ref 12.0–15.0)
LYMPHS ABS: 1.5 10*3/uL (ref 0.7–4.0)
LYMPHS PCT: 18 %
MCH: 28.1 pg (ref 26.0–34.0)
MCHC: 31.3 g/dL (ref 30.0–36.0)
MCV: 89.7 fL (ref 78.0–100.0)
MONO ABS: 1.2 10*3/uL — AB (ref 0.1–1.0)
MONOS PCT: 14 %
NEUTROS ABS: 5.4 10*3/uL (ref 1.7–7.7)
Neutrophils Relative %: 62 %
Platelets: 199 10*3/uL (ref 150–400)
RBC: 4.09 MIL/uL (ref 3.87–5.11)
RDW: 16.8 % — AB (ref 11.5–15.5)
WBC: 8.6 10*3/uL (ref 4.0–10.5)

## 2017-05-31 LAB — BASIC METABOLIC PANEL
ANION GAP: 7 (ref 5–15)
BUN: 24 mg/dL — ABNORMAL HIGH (ref 6–20)
CALCIUM: 8.7 mg/dL — AB (ref 8.9–10.3)
CHLORIDE: 102 mmol/L (ref 101–111)
CO2: 28 mmol/L (ref 22–32)
Creatinine, Ser: 1.36 mg/dL — ABNORMAL HIGH (ref 0.44–1.00)
GFR calc Af Amer: 44 mL/min — ABNORMAL LOW (ref >60)
GFR calc non Af Amer: 38 mL/min — ABNORMAL LOW (ref >60)
GLUCOSE: 133 mg/dL — AB (ref 65–99)
POTASSIUM: 5.2 mmol/L — AB (ref 3.5–5.1)
Sodium: 137 mmol/L (ref 135–145)

## 2017-05-31 LAB — GLUCOSE, CAPILLARY
Glucose-Capillary: 130 mg/dL — ABNORMAL HIGH (ref 65–99)
Glucose-Capillary: 165 mg/dL — ABNORMAL HIGH (ref 65–99)

## 2017-05-31 MED ORDER — DOXYCYCLINE HYCLATE 100 MG PO TABS: 100.0000 mg | ORAL_TABLET | Freq: Two times a day (BID) | ORAL | 0 refills | Status: AC

## 2017-05-31 MED ORDER — BUTALBITAL-APAP-CAFFEINE 50-325-40 MG PO TABS
1.0000 | ORAL_TABLET | Freq: Once | ORAL | Status: AC
  Administered 2017-05-31: 1 via ORAL
  Filled 2017-05-31: qty 1

## 2017-05-31 NOTE — Progress Notes (Signed)
Pt c/o of headache not relieved by tylenol. On call NP Lynch notified. Will continue to monitor pt.

## 2017-05-31 NOTE — Progress Notes (Signed)
Pt discharged with PTAR at 1500. Denies pain or discomfort. No signs symptoms distress noted.

## 2017-05-31 NOTE — Discharge Summary (Addendum)
Physician Discharge Summary  Heather Zimmerman. Heather Zimmerman ZOX:[id number] DOB: 05/15/1946 DOA: 05/28/2017  PCP: Patient, No Pcp Per  Admit date: 05/28/2017 Discharge date: 05/31/2017  Admitted From: SNF Disposition:  SNF  Recommendations for Outpatient Follow-up:  1. Follow up with PCP in 1 week 2. Follow up with your gynecologist for removal of malpositioned IUD 3. Recommend outpatient MRI abdomen with and without contrast to better characterize incidental left kidney cyst  4. Please obtain BMP/CBC in 1 week  5. Please follow up on the following pending results: final blood culture results   Discharge Condition: Stable, improved CODE STATUS: Full  Diet recommendation: Heart healthy   Brief/Interim Summary: Heather Zimmerman. Shieldsis a 71 y.o.femalewith medical history significant of chronic diastolic heart failure, chronic pain, stage III chronic kidney disease, type 2 diabetes, right BKA, essential hypertension, GERD, obstructive sleep apnea on CPAP, venous insufficiency with venous status ulcer, vitamin D deficiency who was transferred from her facility due to shortness of breath and AMS. Apparently EMS was called out due to dyspnea and the patient having agonal breathing. When EMS arrived to the facility, the patient was not in any distress, but complaining of generalized weakness and not feeling well. Her clothes were soaking urine on arrival. Due to patient's fever and AMS, LP was attempted in the ED which was unsuccessful due to patient body habitus. Sepsis source was either possible meningitis vs abdominal wall cellulitis. She was started on vanco, ceftriaxone, ampicillin. Due to low suspicion for meningitis and another source of infection to explain sepsis, ampicillin was discontinued. Patient's mentation improved back to baseline. Cellulitis continued to improve and headache resolved on morning of discharge. She was discharged back to facility on oral doxycycline. Other outpatient recommendations as  above.   Discharge Diagnoses:  Principal Problem:   Sepsis ([place]) Active Problems:   Depression   Chronic diastolic heart failure (HCC)   GERD   OSA treated with BiPAP   Acute encephalopathy   Chronic respiratory failure with hypercapnia (HCC)   Type II diabetes mellitus with neurological manifestations, uncontrolled (HCC)  Acute toxic/metabolic encephalopathy -CT head: No acute intracranial abnormality. -Ammonia 18 -Resolved. Oriented x 3, answers questions appropriately  Sepsis secondary to abdominal wall cellulitis -Initially concern for meningitis. However, there are no focal deficits, no nuchal rigidity. LP would be difficult in the morbid obese patient. Body mass index is 63.5 kg/m. Suspicion for meningitis is low -She does have abdominal wall erythema, induration, warm to touch - continued to improve with antibiotics  -Blood culture negative to date, final result pending at time of discharge  -Urine culture negative  -CXR unremarkable for PNA  -CT abd/pelvis without acute findings, no abscess  -Previously tolerated doxycycline for cellulitis, deescalate antibiotics to oral doxycycline on discharge  -Afebrile overnight, leukocytosis resolved   Mild kyperkalemia -5.2 today. Would repeat BMP as outpatient.   Chronic diastolic heart failure  -Stable without exacerbation  Chronic hypoxemic respiratory failure -Baseline 2L O2   CKD stage 3 -Baseline Cr 1.2-1.3 -Stable   Type II diabetes mellitus, with hyperglycemia and peripheral neuropathy  -SSI   Depression -Continue Cymbalta  OSA -Continue on BiPAP bedtime  Left kidney cyst -Recommend outpatient MRI abdomen with and without contrast to better characterize finding  Malpositioned IUD -Patient states that she had IUD placed about 5 years ago due to fibroids, vaginal bleeding. CT abdomen and pelvis revealed malpositioned IUD with tip likely slightly protruding from the cervical os. She needs to  follow-up with her gynecologist.  Morbid obesity -  Body mass index is 63.5 kg/m.   Discharge Instructions  Discharge Instructions    Call MD for:  difficulty breathing, headache or visual disturbances    Complete by:  As directed    Call MD for:  extreme fatigue    Complete by:  As directed    Call MD for:  hives    Complete by:  As directed    Call MD for:  persistant dizziness or light-headedness    Complete by:  As directed    Call MD for:  persistant nausea and vomiting    Complete by:  As directed    Call MD for:  severe uncontrolled pain    Complete by:  As directed    Call MD for:  temperature >100.4    Complete by:  As directed    Diet - low sodium heart healthy    Complete by:  As directed    Discharge instructions    Complete by:  As directed    You were cared for by a hospitalist during your hospital stay. If you have any questions about your discharge medications or the care you received while you were in the hospital after you are discharged, you can call the unit and asked to speak with the hospitalist on call if the hospitalist that took care of you is not available. Once you are discharged, your primary care physician will handle any further medical issues. Please note that NO REFILLS for any discharge medications will be authorized once you are discharged, as it is imperative that you return to your primary care physician (or establish a relationship with a primary care physician if you do not have one) for your aftercare needs so that they can reassess your need for medications and monitor your lab values.   Increase activity slowly    Complete by:  As directed      Allergies as of 05/31/2017      Reactions   Ace Inhibitors Other (See Comments)   unknown      Medication List    TAKE these medications   acetaminophen 325 MG tablet Commonly known as:  TYLENOL Take 2 tablets (650 mg total) by mouth every 6 (six) hours as needed for mild pain (or Fever >/=  101).   aspirin EC 81 MG tablet Take 81 mg by mouth daily.   cholecalciferol 1000 units tablet Commonly known as:  VITAMIN D Take 2,000 Units by mouth daily.   doxycycline 100 MG tablet Commonly known as:  VIBRA-TABS Take 1 tablet (100 mg total) by mouth every 12 (twelve) hours.   DULoxetine 20 MG capsule Commonly known as:  CYMBALTA Take 40 mg by mouth daily.   feeding supplement (PRO-STAT SUGAR FREE 64) Liqd Take 30 mLs by mouth 3 (three) times daily with meals. For wound healing and skin integrity   fluticasone 50 MCG/ACT nasal spray Commonly known as:  FLONASE Place 2 sprays into both nostrils at bedtime.   Fluticasone-Salmeterol 250-50 MCG/DOSE Aepb Commonly known as:  ADVAIR Inhale 1 puff into the lungs every 12 (twelve) hours. Reported on 04/25/2016   gabapentin 600 MG tablet Commonly known as:  NEURONTIN Take 600 mg by mouth 2 (two) times daily.   GLUCAGON EMERGENCY 1 MG injection Generic drug:  glucagon Inject 1 mg into the vein once as needed (hypoglycemia).   insulin aspart 100 UNIT/ML injection Commonly known as:  novoLOG Inject 0-15 Units into the skin See admin instructions. Inject 0-15 units subcutaneously before  meals and at bedtime per sliding scale:  CBG 0-120 0 units, 121-150 2 units, 151-200 3 units, 201-250 5 units, 251-300 8 units, 301-350 11 units, 351-400 15 units   ipratropium-albuterol 0.5-2.5 (3) MG/3ML Soln Commonly known as:  DUONEB Take 3 mLs by nebulization every 6 (six) hours as needed (shortness of breath).   magnesium hydroxide 400 MG/5ML suspension Commonly known as:  MILK OF MAGNESIA Take 30 mLs by mouth daily as needed for mild constipation.   OXYGEN Inhale 2 L/min into the lungs continuous. 2 liter/min via nasal cannula   PRESCRIPTION MEDICATION Inhale into the lungs at bedtime. BIPAP   UNNA-FLEX ELASTIC UNNA BOOT EX Apply to left leg topically ever Tuesday and Friday   vitamin C 500 MG tablet Commonly known as:  ASCORBIC  ACID Take 500 mg by mouth 2 (two) times daily.       Allergies  Allergen Reactions  . Ace Inhibitors Other (See Comments)    unknown    Consultations:  None   Procedures/Studies: Ct Head Wo Contrast  Result Date: 05/28/2017 CLINICAL DATA:  Altered mental status.  Generalized weakness. EXAM: CT HEAD WITHOUT CONTRAST TECHNIQUE: Contiguous axial images were obtained from the base of the skull through the vertex without intravenous contrast. COMPARISON:  Head CT 12/28/2016 FINDINGS: Brain: No intracranial hemorrhage, mass effect, or midline shift. No hydrocephalus. The basilar cisterns are patent. No evidence of territorial infarct. No extra-axial or intracranial fluid collection. Vascular: Atherosclerosis of skullbase vasculature without hyperdense vessel or abnormal calcification. Skull: The skull fracture or focal lesion. Sinuses/Orbits: Chronic opacification of right maxillary sinus. Remote right orbital fracture. Mastoid air cells are clear. Other: Mild motion artifact. IMPRESSION: No acute intracranial abnormality. Electronically Signed   By: Jeb Levering M.D.   On: 05/28/2017 21:36   Ct Abdomen Pelvis W Contrast  Result Date: 05/29/2017 CLINICAL DATA:  71 year old female with history of sepsis and altered mental status. EXAM: CT ABDOMEN AND PELVIS WITH CONTRAST TECHNIQUE: Multidetector CT imaging of the abdomen and pelvis was performed using the standard protocol following bolus administration of intravenous contrast. CONTRAST:  138mL ISOVUE-300 IOPAMIDOL (ISOVUE-300) INJECTION 61% COMPARISON:  No prior CT the abdomen and pelvis. Chest CT 11/12/2016. FINDINGS: Lower chest: Previously noted thickening of the minor fissure is less bulky than the prior study, currently measuring 10 x 3 mm (coronal image 41 of series 5), presumably a subpleural lymph node. Cardiomegaly. Calcifications of the aortic valve. Hepatobiliary: No suspicious cystic or solid hepatic lesions. No intra or  extrahepatic biliary ductal dilatation. Gallbladder is unremarkable in appearance. Pancreas: No pancreatic mass. No pancreatic ductal dilatation. No pancreatic or peripancreatic fluid or inflammatory changes. Spleen: Unremarkable. Adrenals/Urinary Tract: 3.4 cm intermediate attenuation lesion (27 HU) in the medial aspect of the interpolar region of the left kidney. Other low-attenuation lesions are noted in the kidneys bilaterally, compatible with simple cysts, the largest of which measures 3.6 cm in the lower pole the right kidney. No hydroureteronephrosis. Bilateral perinephric stranding (nonspecific). Urinary bladder is normal in appearance. Stomach/Bowel: The appearance of the stomach is normal. No pathologic dilatation of small bowel or colon. Normal appendix. Vascular/Lymphatic: Aortic atherosclerosis without definite aneurysm or dissection in the abdominal or pelvic vasculature. Several mildly enlarged partially calcified lymph nodes are noted in the pelvis bilaterally measuring up to 12 mm in short axis in the right external iliac nodal station, nonspecific, but favored to be benign. Reproductive: IUD appears malpositioned in the lower uterine segment, likely with the tip extending beyond the cervical  os. Exophytic partially calcified lesion in the left side of the uterine body measuring 4.6 x 3.6 cm, presumably a calcified fibroid. Ovaries are atrophic. Other: Small supraumbilical ventral hernia containing only omental fat. No significant volume of ascites. No pneumoperitoneum. Musculoskeletal: 12 mm densely calcified lesion in the left ilium with narrow zone of transition, presumably a bone island. There are no other aggressive appearing lytic or blastic lesions noted in the visualized portions of the skeleton. IMPRESSION: 1. No acute findings are noted in the abdomen or pelvis. Specifically, no definite source for patient's infection is identified. 2. Small supraumbilical ventral hernia containing only  omental fat. No signs of associated bowel incarceration or obstruction at this time. 3. Indeterminate lesion in the interpolar region of the left kidney. This may simply represent a proteinaceous cyst, however, follow-up evaluation with nonemergent MRI of the abdomen with and without IV gadolinium is suggested in the near future to better characterize this finding. 4. Malpositioned IUD which is in a low position, with the tip likely slightly protruding beyond the cervical os. Consider removal in this postmenopausal patient. 5. Aortic atherosclerosis. 6. There are calcifications of the aortic valve. Echocardiographic correlation for evaluation of potential valvular dysfunction may be warranted if clinically indicated. 7. Mild cardiomegaly. 8. Additional incidental findings, as above. Electronically Signed   By: Vinnie Langton M.D.   On: 05/29/2017 20:29   Dg Chest Port 1 View  Result Date: 05/28/2017 CLINICAL DATA:  Fevers. EXAM: PORTABLE CHEST 1 VIEW COMPARISON:  04/30/2017 FINDINGS: There is moderate cardiac enlargement. There is no pleural effusion or edema. No airspace opacities. IMPRESSION: 1. Cardiac enlargement.  No acute findings. Electronically Signed   By: Kerby Moors M.D.   On: 05/28/2017 20:14       Discharge Exam: Vitals:   05/30/17 2131 05/31/17 0529  BP: (!) 131/54 (!) 142/94  Pulse: 75 79  Resp: 18 18  Temp: 99.6 F (37.6 C) 98.1 F (36.7 C)  SpO2: 100% 100%   Vitals:   05/30/17 1301 05/30/17 1303 05/30/17 2131 05/31/17 0529  BP:  (!) 109/50 (!) 131/54 (!) 142/94  Pulse:  78 75 79  Resp:  18 18 18   Temp:  98.2 F (36.8 C) 99.6 F (37.6 C) 98.1 F (36.7 C)  TempSrc:  Oral Oral Oral  SpO2:  100% 100% 100%  Weight: 133.1 kg (293 lb 6.9 oz)   131.8 kg (290 lb 9.1 oz)  Height:        General exam: Appears calm and comfortable  Respiratory system: Clear to auscultation. Respiratory effort normal. On nasal CPAP  Cardiovascular system: S1 & S2 heard, RRR. No JVD,  murmurs, rubs, gallops or clicks. No pedal edema. Gastrointestinal system: Abdomen is nondistended, soft and nontender. No organomegaly or masses felt. Normal bowel sounds heard. Obese.  Central nervous system: Alert and oriented. No focal neurological deficits. Speech somewhat garbled  Extremities: Right BKA, Left leg with chronic skin changes  Skin: +LLQ abdominal wall cellulitis with erythema, warmth, induration, erythema has improved since my last examination, less induration as well  Psychiatry: Judgement and insight appear normal. Mood & affect appropriate.     The results of significant diagnostics from this hospitalization (including imaging, microbiology, ancillary and laboratory) are listed below for reference.     Microbiology: Recent Results (from the past 240 hour(s))  Blood Culture (routine x 2)     Status: None (Preliminary result)   Collection Time: 05/28/17  7:07 PM  Result Value Ref Range Status  Specimen Description   Final    BLOOD LEFT ANTECUBITAL Blood Culture adequate volume   Special Requests BOTTLES DRAWN AEROBIC AND ANAEROBIC  Final   Culture   Final    NO GROWTH 2 DAYS Performed at [facility], [address] [place], [place] [address]    Report Status PENDING  Incomplete  Blood Culture (routine x 2)     Status: None (Preliminary result)   Collection Time: 05/28/17  7:32 PM  Result Value Ref Range Status   Specimen Description BLOOD RIGHT HAND  Final   Special Requests IN PEDIATRIC BOTTLE Blood Culture adequate volume  Final   Culture   Final    NO GROWTH 2 DAYS Performed at [facility], [place] [address] [place], [place] [address]    Report Status PENDING  Incomplete  Urine culture     Status: None   Collection Time: 05/28/17  7:50 PM  Result Value Ref Range Status   Specimen Description URINE, CLEAN CATCH  Final   Special Requests NONE  Final   Culture   Final    NO GROWTH Performed at [facility], [place] [address],  [place], [place] [address]    Report Status 05/30/2017 FINAL  Final  MRSA PCR Screening     Status: Abnormal   Collection Time: 05/30/17  5:57 PM  Result Value Ref Range Status   MRSA by PCR POSITIVE (A) NEGATIVE Final    Comment:        The GeneXpert MRSA Assay (FDA approved for NASAL specimens only), is one component of a comprehensive MRSA colonization surveillance program. It is not intended to diagnose MRSA infection nor to guide or monitor treatment for MRSA infections. RESULT CALLED TO, READ BACK BY AND VERIFIED WITH: W HODGES AT 2158 ON 08.17.2018 BY NBROOKS      Labs: BNP (last 3 results)  Recent Labs  09/23/16 2354 12/29/16 0101 04/28/17 2125  BNP 124.6* 28.7 49.4   Basic Metabolic Panel:  Recent Labs Lab 05/28/17 1907 05/29/17 0002 05/29/17 0515 05/30/17 0931 05/31/17 0410  NA 137  --  139 139 137  K 4.2  --  4.0 4.1 5.2*  CL 99*  --  104 103 102  CO2 30  --  27 28 28   GLUCOSE 156*  --  196* 116* 133*  BUN 14  --  16 24* 24*  CREATININE 1.08*  --  1.30* 1.36* 1.36*  CALCIUM 9.2  --  8.3* 8.7* 8.7*  MG  --  1.0*  --  2.2  --    Liver Function Tests:  Recent Labs Lab 05/28/17 1907  AST 33  ALT 27  ALKPHOS 64  BILITOT 1.2  PROT 8.2*  ALBUMIN 3.1*   No results for input(s): LIPASE, AMYLASE in the last 168 hours.  Recent Labs Lab 05/29/17 0120  AMMONIA 18   CBC:  Recent Labs Lab 05/28/17 1907 05/29/17 0515 05/30/17 1031 05/31/17 0832  WBC 12.8* 12.9* 13.3* 8.6  NEUTROABS 10.4* 11.8* 10.3* 5.4  HGB 12.8 11.9* 10.6* 11.5*  HCT 39.1 37.2 33.7* 36.7  MCV 87.9 87.1 88.9 89.7  PLT 195 155 179 199   Cardiac Enzymes: No results for input(s): CKTOTAL, CKMB, CKMBINDEX, TROPONINI in the last 168 hours. BNP: Invalid input(s): POCBNP CBG:  Recent Labs Lab 05/30/17 0752 05/30/17 1139 05/30/17 1635 05/30/17 2129 05/31/17 0806  GLUCAP 140* 151* 222* 150* 130*   D-Dimer No results for input(s): DDIMER in the last  72 hours. Hgb  A1c No results for input(s): HGBA1C in the last 72 hours. Lipid Profile No results for input(s): CHOL, HDL, LDLCALC, TRIG, CHOLHDL, LDLDIRECT in the last 72 hours. Thyroid function studies No results for input(s): TSH, T4TOTAL, T3FREE, THYROIDAB in the last 72 hours.  Invalid input(s): FREET3 Anemia work up No results for input(s): VITAMINB12, FOLATE, FERRITIN, TIBC, IRON, RETICCTPCT in the last 72 hours. Urinalysis    Component Value Date/Time   COLORURINE YELLOW 05/28/2017 1907   APPEARANCEUR CLEAR 05/28/2017 1907   LABSPEC 1.016 05/28/2017 1907   PHURINE 7.0 05/28/2017 1907   GLUCOSEU NEGATIVE 05/28/2017 [place] NEGATIVE 05/28/2017 [place] NEGATIVE 05/28/2017 1907   KETONESUR 5 (A) 05/28/2017 1907   PROTEINUR >=300 (A) 05/28/2017 1907   UROBILINOGEN 0.2 07/18/2015 0609   NITRITE NEGATIVE 05/28/2017 1907   LEUKOCYTESUR NEGATIVE 05/28/2017 1907   Sepsis Labs Invalid input(s): PROCALCITONIN,  WBC,  LACTICIDVEN Microbiology Recent Results (from the past 240 hour(s))  Blood Culture (routine x 2)     Status: None (Preliminary result)   Collection Time: 05/28/17  7:07 PM  Result Value Ref Range Status   Specimen Description   Final    BLOOD LEFT ANTECUBITAL Blood Culture adequate volume   Special Requests BOTTLES DRAWN AEROBIC AND ANAEROBIC  Final   Culture   Final    NO GROWTH 2 DAYS Performed at [facility], [place] [address] [place], [place] [address]    Report Status PENDING  Incomplete  Blood Culture (routine x 2)     Status: None (Preliminary result)   Collection Time: 05/28/17  7:32 PM  Result Value Ref Range Status   Specimen Description BLOOD RIGHT HAND  Final   Special Requests IN PEDIATRIC BOTTLE Blood Culture adequate volume  Final   Culture   Final    NO GROWTH 2 DAYS Performed at [facility], [place] [address], [place], [place] [address]    Report Status PENDING  Incomplete  Urine culture     Status: None   Collection Time:  05/28/17  7:50 PM  Result Value Ref Range Status   Specimen Description URINE, CLEAN CATCH  Final   Special Requests NONE  Final   Culture   Final    NO GROWTH Performed at [facility], [place] [address], [place], [place] [address]    Report Status 05/30/2017 FINAL  Final  MRSA PCR Screening     Status: Abnormal   Collection Time: 05/30/17  5:57 PM  Result Value Ref Range Status   MRSA by PCR POSITIVE (A) NEGATIVE Final    Comment:        The GeneXpert MRSA Assay (FDA approved for NASAL specimens only), is one component of a comprehensive MRSA colonization surveillance program. It is not intended to diagnose MRSA infection nor to guide or monitor treatment for MRSA infections. RESULT CALLED TO, READ BACK BY AND VERIFIED WITH: W HODGES AT 2158 ON 08.17.2018 BY NBROOKS      Time coordinating discharge: 40 minutes  SIGNED:  Dessa Phi, DO [facility] Pager [phone]  If 7PM-7AM, please contact night-coverage [email or web address] Password [facility] 05/31/2017, 10:22 AM

## 2017-05-31 NOTE — Progress Notes (Signed)
Patient will discharge to [facility] Anticipated discharge date: 8/18 Transportation by [facility]- scheduled for 1:30pm  CSW signing off.  Jorge Ny, LCSW Clinical Social Worker [phone]

## 2017-06-02 DIAGNOSIS — N390 Urinary tract infection, site not specified: Secondary | ICD-10-CM | POA: Diagnosis not present

## 2017-06-02 DIAGNOSIS — K219 Gastro-esophageal reflux disease without esophagitis: Secondary | ICD-10-CM | POA: Diagnosis not present

## 2017-06-02 DIAGNOSIS — M6281 Muscle weakness (generalized): Secondary | ICD-10-CM | POA: Diagnosis not present

## 2017-06-02 DIAGNOSIS — L0390 Cellulitis, unspecified: Secondary | ICD-10-CM | POA: Diagnosis not present

## 2017-06-02 LAB — CULTURE, BLOOD (ROUTINE X 2)
CULTURE: NO GROWTH
Culture: NO GROWTH
SPECIAL REQUESTS: ADEQUATE
SPECIMEN DESCRIPTION: ADEQUATE

## 2017-06-03 DIAGNOSIS — I83028 Varicose veins of left lower extremity with ulcer other part of lower leg: Secondary | ICD-10-CM | POA: Diagnosis not present

## 2017-06-10 DIAGNOSIS — I83028 Varicose veins of left lower extremity with ulcer other part of lower leg: Secondary | ICD-10-CM | POA: Diagnosis not present

## 2017-06-13 DIAGNOSIS — R300 Dysuria: Secondary | ICD-10-CM | POA: Diagnosis not present

## 2017-06-22 ENCOUNTER — Emergency Department (HOSPITAL_COMMUNITY)
Admission: EM | Admit: 2017-06-22 | Discharge: 2017-06-22 | Disposition: A | Discharge status: Home / Self Care | Payer: Medicare Other | Attending: Emergency Medicine | Admitting: Emergency Medicine

## 2017-06-22 ENCOUNTER — Encounter (HOSPITAL_COMMUNITY): Payer: Self-pay | Admitting: Emergency Medicine

## 2017-06-22 ENCOUNTER — Emergency Department (HOSPITAL_COMMUNITY): Payer: Medicare Other

## 2017-06-22 DIAGNOSIS — R51 Headache: Secondary | ICD-10-CM | POA: Diagnosis not present

## 2017-06-22 DIAGNOSIS — N183 Chronic kidney disease, stage 3 (moderate): Secondary | ICD-10-CM | POA: Insufficient documentation

## 2017-06-22 DIAGNOSIS — Z794 Long term (current) use of insulin: Secondary | ICD-10-CM | POA: Diagnosis not present

## 2017-06-22 DIAGNOSIS — I130 Hypertensive heart and chronic kidney disease with heart failure and stage 1 through stage 4 chronic kidney disease, or unspecified chronic kidney disease: Secondary | ICD-10-CM | POA: Diagnosis not present

## 2017-06-22 DIAGNOSIS — E1122 Type 2 diabetes mellitus with diabetic chronic kidney disease: Secondary | ICD-10-CM | POA: Diagnosis not present

## 2017-06-22 DIAGNOSIS — I5032 Chronic diastolic (congestive) heart failure: Secondary | ICD-10-CM | POA: Insufficient documentation

## 2017-06-22 DIAGNOSIS — R519 Headache, unspecified: Secondary | ICD-10-CM

## 2017-06-22 DIAGNOSIS — Z79899 Other long term (current) drug therapy: Secondary | ICD-10-CM | POA: Insufficient documentation

## 2017-06-22 DIAGNOSIS — Z7982 Long term (current) use of aspirin: Secondary | ICD-10-CM | POA: Insufficient documentation

## 2017-06-22 MED ORDER — ACETAMINOPHEN 325 MG PO TABS
650.0000 mg | ORAL_TABLET | Freq: Once | ORAL | Status: AC
  Administered 2017-06-22: 650 mg via ORAL
  Filled 2017-06-22: qty 2

## 2017-06-22 NOTE — ED Notes (Signed)
Pt verbalized understanding discharge instructions and denies any further needs or questions at this time. VS stable, ambulatory and steady gait.   

## 2017-06-22 NOTE — ED Notes (Signed)
Patient transported to CT 

## 2017-06-22 NOTE — ED Triage Notes (Signed)
Per [facility] patient coming from [facility] complaining of a headache x 2 days. EMS reports no neuro deficients. Patient has hx of speech impediment and per facility is at baseline. Patient refuses non rebreather treatment she is suppose to receive at facility. Patient 99% on room air with ems. History of hypertension. 186/87 with ems. Patient alert and oriented x4.
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
## 2017-06-26 DIAGNOSIS — N183 Chronic kidney disease, stage 3 (moderate): Secondary | ICD-10-CM | POA: Diagnosis not present

## 2017-06-26 DIAGNOSIS — I5032 Chronic diastolic (congestive) heart failure: Secondary | ICD-10-CM | POA: Diagnosis not present

## 2017-06-26 DIAGNOSIS — I10 Essential (primary) hypertension: Secondary | ICD-10-CM | POA: Diagnosis not present

## 2017-06-26 DIAGNOSIS — K219 Gastro-esophageal reflux disease without esophagitis: Secondary | ICD-10-CM | POA: Diagnosis not present

## 2017-06-30 DIAGNOSIS — I10 Essential (primary) hypertension: Secondary | ICD-10-CM | POA: Diagnosis not present

## 2017-06-30 DIAGNOSIS — N183 Chronic kidney disease, stage 3 (moderate): Secondary | ICD-10-CM | POA: Diagnosis not present

## 2017-06-30 DIAGNOSIS — I5032 Chronic diastolic (congestive) heart failure: Secondary | ICD-10-CM | POA: Diagnosis not present

## 2017-06-30 DIAGNOSIS — G4733 Obstructive sleep apnea (adult) (pediatric): Secondary | ICD-10-CM | POA: Diagnosis not present

## 2017-07-14 DIAGNOSIS — I872 Venous insufficiency (chronic) (peripheral): Secondary | ICD-10-CM | POA: Diagnosis not present

## 2017-07-14 DIAGNOSIS — E119 Type 2 diabetes mellitus without complications: Secondary | ICD-10-CM | POA: Diagnosis not present

## 2017-07-24 DIAGNOSIS — E119 Type 2 diabetes mellitus without complications: Secondary | ICD-10-CM | POA: Diagnosis not present

## 2017-07-24 DIAGNOSIS — I10 Essential (primary) hypertension: Secondary | ICD-10-CM | POA: Diagnosis not present

## 2017-07-24 DIAGNOSIS — N183 Chronic kidney disease, stage 3 (moderate): Secondary | ICD-10-CM | POA: Diagnosis not present

## 2017-07-24 DIAGNOSIS — K219 Gastro-esophageal reflux disease without esophagitis: Secondary | ICD-10-CM | POA: Diagnosis not present

## 2017-07-28 DIAGNOSIS — I5032 Chronic diastolic (congestive) heart failure: Secondary | ICD-10-CM | POA: Diagnosis not present

## 2017-07-28 DIAGNOSIS — G4733 Obstructive sleep apnea (adult) (pediatric): Secondary | ICD-10-CM | POA: Diagnosis not present

## 2017-07-28 DIAGNOSIS — N183 Chronic kidney disease, stage 3 (moderate): Secondary | ICD-10-CM | POA: Diagnosis not present

## 2017-07-28 DIAGNOSIS — I10 Essential (primary) hypertension: Secondary | ICD-10-CM | POA: Diagnosis not present

## 2017-07-29 DIAGNOSIS — I83028 Varicose veins of left lower extremity with ulcer other part of lower leg: Secondary | ICD-10-CM | POA: Diagnosis not present

## 2017-08-05 DIAGNOSIS — I83028 Varicose veins of left lower extremity with ulcer other part of lower leg: Secondary | ICD-10-CM | POA: Diagnosis not present

## 2017-08-12 DIAGNOSIS — I83028 Varicose veins of left lower extremity with ulcer other part of lower leg: Secondary | ICD-10-CM | POA: Diagnosis not present

## 2017-08-15 DIAGNOSIS — F329 Major depressive disorder, single episode, unspecified: Secondary | ICD-10-CM | POA: Diagnosis not present

## 2017-08-15 DIAGNOSIS — F419 Anxiety disorder, unspecified: Secondary | ICD-10-CM | POA: Diagnosis not present

## 2017-08-15 DIAGNOSIS — G4700 Insomnia, unspecified: Secondary | ICD-10-CM | POA: Diagnosis not present

## 2017-08-19 DIAGNOSIS — N183 Chronic kidney disease, stage 3 (moderate): Secondary | ICD-10-CM | POA: Diagnosis not present

## 2017-08-19 DIAGNOSIS — E119 Type 2 diabetes mellitus without complications: Secondary | ICD-10-CM | POA: Diagnosis not present

## 2017-08-19 DIAGNOSIS — I83028 Varicose veins of left lower extremity with ulcer other part of lower leg: Secondary | ICD-10-CM | POA: Diagnosis not present

## 2017-08-19 DIAGNOSIS — K219 Gastro-esophageal reflux disease without esophagitis: Secondary | ICD-10-CM | POA: Diagnosis not present

## 2017-08-19 DIAGNOSIS — I10 Essential (primary) hypertension: Secondary | ICD-10-CM | POA: Diagnosis not present

## 2017-08-25 DIAGNOSIS — I10 Essential (primary) hypertension: Secondary | ICD-10-CM | POA: Diagnosis not present

## 2017-08-25 DIAGNOSIS — N183 Chronic kidney disease, stage 3 (moderate): Secondary | ICD-10-CM | POA: Diagnosis not present

## 2017-08-25 DIAGNOSIS — E119 Type 2 diabetes mellitus without complications: Secondary | ICD-10-CM | POA: Diagnosis not present

## 2017-08-25 DIAGNOSIS — I5032 Chronic diastolic (congestive) heart failure: Secondary | ICD-10-CM | POA: Diagnosis not present

## 2017-08-26 DIAGNOSIS — F419 Anxiety disorder, unspecified: Secondary | ICD-10-CM | POA: Diagnosis not present

## 2017-08-26 DIAGNOSIS — F329 Major depressive disorder, single episode, unspecified: Secondary | ICD-10-CM | POA: Diagnosis not present

## 2017-08-26 DIAGNOSIS — I83028 Varicose veins of left lower extremity with ulcer other part of lower leg: Secondary | ICD-10-CM | POA: Diagnosis not present

## 2017-08-26 DIAGNOSIS — I70242 Atherosclerosis of native arteries of left leg with ulceration of calf: Secondary | ICD-10-CM | POA: Diagnosis not present

## 2017-08-26 DIAGNOSIS — G4700 Insomnia, unspecified: Secondary | ICD-10-CM | POA: Diagnosis not present

## 2017-09-09 DIAGNOSIS — I70242 Atherosclerosis of native arteries of left leg with ulceration of calf: Secondary | ICD-10-CM | POA: Diagnosis not present

## 2017-09-09 DIAGNOSIS — I83028 Varicose veins of left lower extremity with ulcer other part of lower leg: Secondary | ICD-10-CM | POA: Diagnosis not present

## 2017-09-15 ENCOUNTER — Other Ambulatory Visit: Payer: Self-pay

## 2017-09-15 DIAGNOSIS — L98499 Non-pressure chronic ulcer of skin of other sites with unspecified severity: Secondary | ICD-10-CM

## 2017-09-16 DIAGNOSIS — I83028 Varicose veins of left lower extremity with ulcer other part of lower leg: Secondary | ICD-10-CM | POA: Diagnosis not present

## 2017-09-23 DIAGNOSIS — N183 Chronic kidney disease, stage 3 (moderate): Secondary | ICD-10-CM | POA: Diagnosis not present

## 2017-09-23 DIAGNOSIS — I5032 Chronic diastolic (congestive) heart failure: Secondary | ICD-10-CM | POA: Diagnosis not present

## 2017-09-23 DIAGNOSIS — I10 Essential (primary) hypertension: Secondary | ICD-10-CM | POA: Diagnosis not present

## 2017-09-23 DIAGNOSIS — E119 Type 2 diabetes mellitus without complications: Secondary | ICD-10-CM | POA: Diagnosis not present

## 2017-09-23 DIAGNOSIS — I83028 Varicose veins of left lower extremity with ulcer other part of lower leg: Secondary | ICD-10-CM | POA: Diagnosis not present

## 2017-09-25 DIAGNOSIS — M6281 Muscle weakness (generalized): Secondary | ICD-10-CM | POA: Diagnosis not present

## 2017-09-25 DIAGNOSIS — I10 Essential (primary) hypertension: Secondary | ICD-10-CM | POA: Diagnosis not present

## 2017-09-25 DIAGNOSIS — E119 Type 2 diabetes mellitus without complications: Secondary | ICD-10-CM | POA: Diagnosis not present

## 2017-09-25 DIAGNOSIS — J309 Allergic rhinitis, unspecified: Secondary | ICD-10-CM | POA: Diagnosis not present

## 2017-09-30 DIAGNOSIS — B351 Tinea unguium: Secondary | ICD-10-CM | POA: Diagnosis not present

## 2017-09-30 DIAGNOSIS — I83028 Varicose veins of left lower extremity with ulcer other part of lower leg: Secondary | ICD-10-CM | POA: Diagnosis not present

## 2017-09-30 DIAGNOSIS — I70242 Atherosclerosis of native arteries of left leg with ulceration of calf: Secondary | ICD-10-CM | POA: Diagnosis not present

## 2017-09-30 DIAGNOSIS — Z89511 Acquired absence of right leg below knee: Secondary | ICD-10-CM | POA: Diagnosis not present

## 2017-10-07 IMAGING — DX DG CHEST 1V PORT
1 series · 1 of 1 positions shown · non-contrast
Comparison: Single-view of the chest 10/05/2015 and 06/27/2014.

CLINICAL DATA: Shortness of breath and productive cough congestion.
History of COPD.

EXAM:
PORTABLE CHEST 1 VIEW

[chest ap]
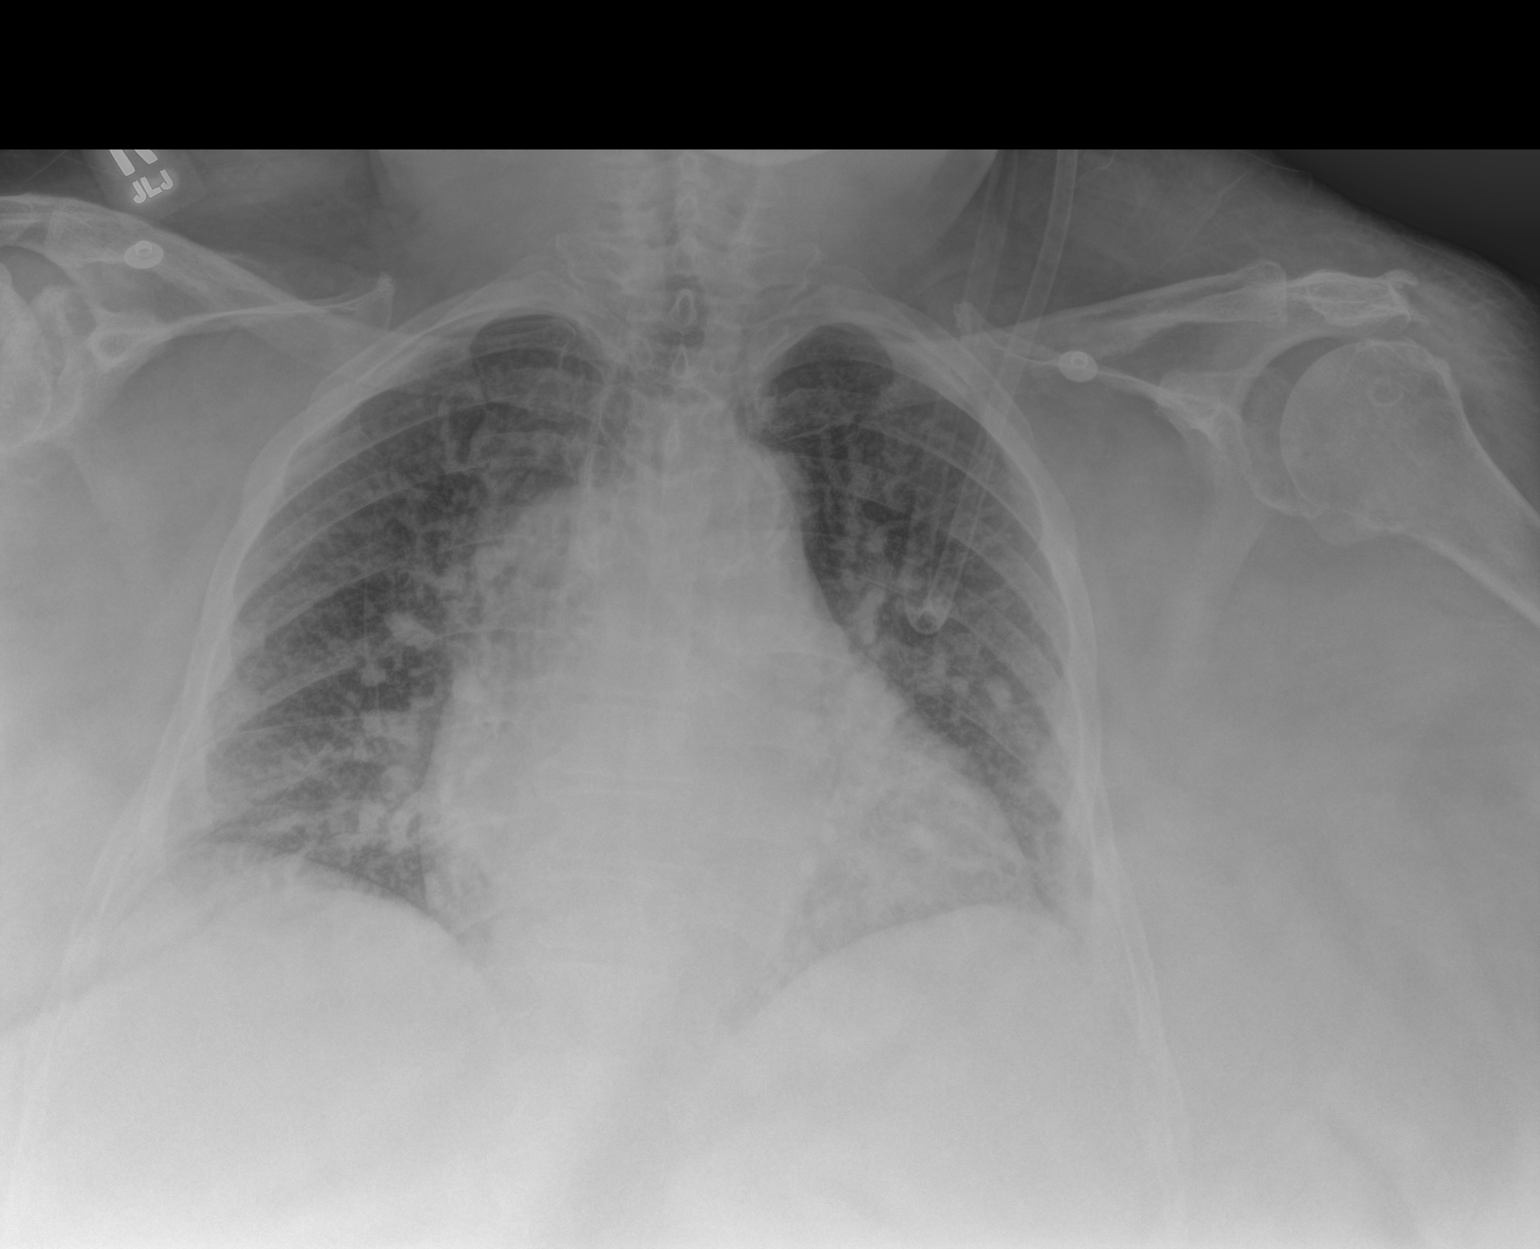

[1 of 1 positions shown; findings below may reference images not displayed]

FINDINGS: There is cardiomegaly and interstitial pulmonary edema. No
pneumothorax or pleural effusion.
IMPRESSION: Cardiomegaly and interstitial edema.

## 2017-10-08 DIAGNOSIS — M6281 Muscle weakness (generalized): Secondary | ICD-10-CM | POA: Diagnosis not present

## 2017-10-08 DIAGNOSIS — I5032 Chronic diastolic (congestive) heart failure: Secondary | ICD-10-CM | POA: Diagnosis not present

## 2017-10-08 DIAGNOSIS — L0390 Cellulitis, unspecified: Secondary | ICD-10-CM | POA: Diagnosis not present

## 2017-10-08 DIAGNOSIS — J9620 Acute and chronic respiratory failure, unspecified whether with hypoxia or hypercapnia: Secondary | ICD-10-CM | POA: Diagnosis not present

## 2017-10-09 DIAGNOSIS — I5032 Chronic diastolic (congestive) heart failure: Secondary | ICD-10-CM | POA: Diagnosis not present

## 2017-10-09 DIAGNOSIS — M6281 Muscle weakness (generalized): Secondary | ICD-10-CM | POA: Diagnosis not present

## 2017-10-09 DIAGNOSIS — L0390 Cellulitis, unspecified: Secondary | ICD-10-CM | POA: Diagnosis not present

## 2017-10-09 DIAGNOSIS — J9620 Acute and chronic respiratory failure, unspecified whether with hypoxia or hypercapnia: Secondary | ICD-10-CM | POA: Diagnosis not present

## 2017-10-10 DIAGNOSIS — J9620 Acute and chronic respiratory failure, unspecified whether with hypoxia or hypercapnia: Secondary | ICD-10-CM | POA: Diagnosis not present

## 2017-10-10 DIAGNOSIS — I5032 Chronic diastolic (congestive) heart failure: Secondary | ICD-10-CM | POA: Diagnosis not present

## 2017-10-10 DIAGNOSIS — M6281 Muscle weakness (generalized): Secondary | ICD-10-CM | POA: Diagnosis not present

## 2017-10-10 DIAGNOSIS — L0390 Cellulitis, unspecified: Secondary | ICD-10-CM | POA: Diagnosis not present

## 2017-10-12 DIAGNOSIS — M6281 Muscle weakness (generalized): Secondary | ICD-10-CM | POA: Diagnosis not present

## 2017-10-12 DIAGNOSIS — J9620 Acute and chronic respiratory failure, unspecified whether with hypoxia or hypercapnia: Secondary | ICD-10-CM | POA: Diagnosis not present

## 2017-10-12 DIAGNOSIS — L0390 Cellulitis, unspecified: Secondary | ICD-10-CM | POA: Diagnosis not present

## 2017-10-12 DIAGNOSIS — I5032 Chronic diastolic (congestive) heart failure: Secondary | ICD-10-CM | POA: Diagnosis not present

## 2017-10-13 DIAGNOSIS — J9620 Acute and chronic respiratory failure, unspecified whether with hypoxia or hypercapnia: Secondary | ICD-10-CM | POA: Diagnosis not present

## 2017-10-13 DIAGNOSIS — I5032 Chronic diastolic (congestive) heart failure: Secondary | ICD-10-CM | POA: Diagnosis not present

## 2017-10-13 DIAGNOSIS — M6281 Muscle weakness (generalized): Secondary | ICD-10-CM | POA: Diagnosis not present

## 2017-10-13 DIAGNOSIS — L0390 Cellulitis, unspecified: Secondary | ICD-10-CM | POA: Diagnosis not present

## 2017-10-14 DIAGNOSIS — J9620 Acute and chronic respiratory failure, unspecified whether with hypoxia or hypercapnia: Secondary | ICD-10-CM | POA: Diagnosis not present

## 2017-10-14 DIAGNOSIS — I5032 Chronic diastolic (congestive) heart failure: Secondary | ICD-10-CM | POA: Diagnosis not present

## 2017-10-14 DIAGNOSIS — I70242 Atherosclerosis of native arteries of left leg with ulceration of calf: Secondary | ICD-10-CM | POA: Diagnosis not present

## 2017-10-14 DIAGNOSIS — I83028 Varicose veins of left lower extremity with ulcer other part of lower leg: Secondary | ICD-10-CM | POA: Diagnosis not present

## 2017-10-14 DIAGNOSIS — M6281 Muscle weakness (generalized): Secondary | ICD-10-CM | POA: Diagnosis not present

## 2017-10-16 DIAGNOSIS — J9620 Acute and chronic respiratory failure, unspecified whether with hypoxia or hypercapnia: Secondary | ICD-10-CM | POA: Diagnosis not present

## 2017-10-16 DIAGNOSIS — M6281 Muscle weakness (generalized): Secondary | ICD-10-CM | POA: Diagnosis not present

## 2017-10-16 DIAGNOSIS — I5032 Chronic diastolic (congestive) heart failure: Secondary | ICD-10-CM | POA: Diagnosis not present

## 2017-10-20 DIAGNOSIS — I5032 Chronic diastolic (congestive) heart failure: Secondary | ICD-10-CM | POA: Diagnosis not present

## 2017-10-20 DIAGNOSIS — I10 Essential (primary) hypertension: Secondary | ICD-10-CM | POA: Diagnosis not present

## 2017-10-20 DIAGNOSIS — I872 Venous insufficiency (chronic) (peripheral): Secondary | ICD-10-CM | POA: Diagnosis not present

## 2017-10-20 DIAGNOSIS — N183 Chronic kidney disease, stage 3 (moderate): Secondary | ICD-10-CM | POA: Diagnosis not present

## 2017-10-21 DIAGNOSIS — J9620 Acute and chronic respiratory failure, unspecified whether with hypoxia or hypercapnia: Secondary | ICD-10-CM | POA: Diagnosis not present

## 2017-10-21 DIAGNOSIS — M6281 Muscle weakness (generalized): Secondary | ICD-10-CM | POA: Diagnosis not present

## 2017-10-21 DIAGNOSIS — I5032 Chronic diastolic (congestive) heart failure: Secondary | ICD-10-CM | POA: Diagnosis not present

## 2017-10-21 DIAGNOSIS — I83028 Varicose veins of left lower extremity with ulcer other part of lower leg: Secondary | ICD-10-CM | POA: Diagnosis not present

## 2017-10-22 DIAGNOSIS — I5032 Chronic diastolic (congestive) heart failure: Secondary | ICD-10-CM | POA: Diagnosis not present

## 2017-10-22 DIAGNOSIS — J9620 Acute and chronic respiratory failure, unspecified whether with hypoxia or hypercapnia: Secondary | ICD-10-CM | POA: Diagnosis not present

## 2017-10-22 DIAGNOSIS — M6281 Muscle weakness (generalized): Secondary | ICD-10-CM | POA: Diagnosis not present

## 2017-10-23 DIAGNOSIS — J9620 Acute and chronic respiratory failure, unspecified whether with hypoxia or hypercapnia: Secondary | ICD-10-CM | POA: Diagnosis not present

## 2017-10-23 DIAGNOSIS — I5032 Chronic diastolic (congestive) heart failure: Secondary | ICD-10-CM | POA: Diagnosis not present

## 2017-10-23 DIAGNOSIS — M6281 Muscle weakness (generalized): Secondary | ICD-10-CM | POA: Diagnosis not present

## 2017-10-24 DIAGNOSIS — J9620 Acute and chronic respiratory failure, unspecified whether with hypoxia or hypercapnia: Secondary | ICD-10-CM | POA: Diagnosis not present

## 2017-10-24 DIAGNOSIS — M6281 Muscle weakness (generalized): Secondary | ICD-10-CM | POA: Diagnosis not present

## 2017-10-24 DIAGNOSIS — I5032 Chronic diastolic (congestive) heart failure: Secondary | ICD-10-CM | POA: Diagnosis not present

## 2017-10-27 DIAGNOSIS — I5032 Chronic diastolic (congestive) heart failure: Secondary | ICD-10-CM | POA: Diagnosis not present

## 2017-10-27 DIAGNOSIS — M6281 Muscle weakness (generalized): Secondary | ICD-10-CM | POA: Diagnosis not present

## 2017-10-27 DIAGNOSIS — J9620 Acute and chronic respiratory failure, unspecified whether with hypoxia or hypercapnia: Secondary | ICD-10-CM | POA: Diagnosis not present

## 2017-10-28 DIAGNOSIS — I83028 Varicose veins of left lower extremity with ulcer other part of lower leg: Secondary | ICD-10-CM | POA: Diagnosis not present

## 2017-10-28 DIAGNOSIS — Z23 Encounter for immunization: Secondary | ICD-10-CM | POA: Diagnosis not present

## 2017-10-29 ENCOUNTER — Encounter: Payer: Self-pay | Admitting: Vascular Surgery

## 2017-10-29 ENCOUNTER — Ambulatory Visit (INDEPENDENT_AMBULATORY_CARE_PROVIDER_SITE_OTHER): Payer: Medicare Other | Admitting: Vascular Surgery

## 2017-10-29 ENCOUNTER — Ambulatory Visit (HOSPITAL_COMMUNITY)
Admission: RE | Admit: 2017-10-29 | Discharge: 2017-10-29 | Disposition: A | Discharge status: Home / Self Care | Payer: Medicare Other | Source: Ambulatory Visit | Attending: Vascular Surgery | Admitting: Vascular Surgery

## 2017-10-29 VITALS — BP 138/71 | HR 68 | Temp 97.7°F | Resp 20 | Ht <= 58 in | Wt 290.0 lb

## 2017-10-29 DIAGNOSIS — J9620 Acute and chronic respiratory failure, unspecified whether with hypoxia or hypercapnia: Secondary | ICD-10-CM | POA: Diagnosis not present

## 2017-10-29 DIAGNOSIS — M6281 Muscle weakness (generalized): Secondary | ICD-10-CM | POA: Diagnosis not present

## 2017-10-29 DIAGNOSIS — L98499 Non-pressure chronic ulcer of skin of other sites with unspecified severity: Secondary | ICD-10-CM | POA: Insufficient documentation

## 2017-10-29 DIAGNOSIS — I872 Venous insufficiency (chronic) (peripheral): Secondary | ICD-10-CM | POA: Diagnosis not present

## 2017-10-29 DIAGNOSIS — I5032 Chronic diastolic (congestive) heart failure: Secondary | ICD-10-CM | POA: Diagnosis not present

## 2017-10-29 NOTE — Progress Notes (Signed)
Patient name: Heather Zimmerman. [place] MRN: [id number] DOB: 1946-06-13 Sex: female   REASON FOR CONSULT:    Left lower extremity ulcers.  The consult is requested by Binnie Rail  HPI:   Heather Arthurs. Zimmerman is a pleasant 72 y.o. female, who presents with nonhealing wounds on her left leg.  She resides in a skilled nursing facility.  She is nonambulatory.  She had a right below the knee amputation in 2013 at [facility] for a diabetic foot infection.  She is being treated in the wound care center.  She denies fever or chills.  She is unaware of any previous history of DVT or phlebitis.  I have reviewed the records that were sent from the referring office.  The patient has been following with the wound on the left distal anterior shin.  She has multiple medical comorbidities including obesity, COPD, congestive heart failure, peripheral arterial disease, and type 2 diabetes.  It is felt that this is likely a venous ulcer.  Most recent measurement was 1.8 cm x 1.7 cm x 0.1 cm in depth.  Past Medical History:  Diagnosis Date  . Chronic diastolic heart failure ([place]) 12/31/2012  . Chronic pain 12/31/2012  . CKD (chronic kidney disease), stage III ([place])   . Diabetes mellitus without complication ([place])    TYPE 2  . Essential hypertension, benign 12/31/2012  . GERD 12/31/2012  . Obstructive sleep apnea 07/05/2014  . Shortness of breath dyspnea   . 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 Unspecified vitamin D deficiency 12/31/2012  . Venous insufficiency (chronic) (peripheral)   . Venous stasis ulcers ([facility])     Family History  Problem Relation Age of Onset  . Hypertension Other     SOCIAL HISTORY: Social History   Socioeconomic History  . Marital status: Widowed    Spouse name: Not on file  . Number of children: Not on file  . Years of education: Not on file  . Highest education level: Not on file   Social Needs  . Financial resource strain: Not on file  . Food insecurity - worry: Not on file  . Food insecurity - inability: Not on file  . Transportation needs - medical: Not on file  . Transportation needs - non-medical: Not on file  Occupational History  . Not on file  Tobacco Use  . Smoking status: Never Smoker  . Smokeless tobacco: Never Used  Substance and Sexual Activity  . Alcohol use: No  . Drug use: No  . Sexual activity: No  Other Topics Concern  . Not on file  Social History Narrative  . Not on file    Allergies  Allergen Reactions  . Ace Inhibitors Other (See Comments)    unknown    Current Outpatient Medications  Medication Sig Dispense Refill  . amLODipine (NORVASC) 5 MG tablet     . aspirin EC 81 MG tablet Take 81 mg by mouth daily.    . DULoxetine (CYMBALTA) 20 MG capsule Take 40 mg by mouth daily.     . fluticasone (FLONASE) 50 MCG/ACT nasal spray Place 2 sprays into both nostrils at bedtime.    . furosemide (LASIX) 40 MG tablet 20 mg.     . gabapentin (NEURONTIN) 600 MG tablet Take 600 mg by mouth 2 (two) times daily.    Marland Kitchen glucagon (GLUCAGON EMERGENCY) 1 MG injection Inject 1 mg into the vein once as needed (hypoglycemia).    Marland Kitchen  metFORMIN (GLUCOPHAGE) 1000 MG tablet Take 1,000 mg by mouth 2 (two) times daily with a meal.    . acetaminophen (TYLENOL) 325 MG tablet Take 2 tablets (650 mg total) by mouth every 6 (six) hours as needed for mild pain (or Fever >/= 101).    . Amino Acids-Protein Hydrolys (FEEDING SUPPLEMENT, PRO-STAT SUGAR FREE 64,) LIQD Take 30 mLs by mouth 3 (three) times daily with meals. For wound healing and skin integrity    . cholecalciferol (VITAMIN D) 1000 UNITS tablet Take 2,000 Units by mouth daily.    . Fluticasone-Salmeterol (ADVAIR) 250-50 MCG/DOSE AEPB Inhale 1 puff into the lungs every 12 (twelve) hours. Reported on 04/25/2016    . insulin aspart (NOVOLOG) 100 UNIT/ML injection Inject 0-15 Units into the skin See admin  instructions. Inject 0-15 units subcutaneously before meals and at bedtime per sliding scale:  CBG 0-120 0 units, 121-150 2 units, 151-200 3 units, 201-250 5 units, 251-300 8 units, 301-350 11 units, 351-400 15 units    . ipratropium-albuterol (DUONEB) 0.5-2.5 (3) MG/3ML SOLN Take 3 mLs by nebulization every 6 (six) hours as needed (shortness of breath). 360 mL   . magnesium hydroxide (MILK OF MAGNESIA) 400 MG/5ML suspension Take 30 mLs by mouth daily as needed for mild constipation.    . OXYGEN Inhale 2 L/min into the lungs continuous. via nasal cannula    . PRESCRIPTION MEDICATION Inhale into the lungs at bedtime. BIPAP    . sulfamethoxazole-trimethoprim (BACTRIM,SEPTRA) 400-80 MG tablet Take 1 tablet by mouth 2 (two) times daily. 5 day course started 06/21/17 am    . traMADol (ULTRAM) 50 MG tablet Take 50 mg by mouth every 6 (six) hours as needed (pain).    . vitamin C (ASCORBIC ACID) 500 MG tablet Take 500 mg by mouth 2 (two) times daily.     No current facility-administered medications for this visit.     REVIEW OF SYSTEMS:  [X]  denotes positive finding, [ ]  denotes negative finding Cardiac  Comments:  Chest pain or chest pressure:    Shortness of breath upon exertion:    Short of breath when lying flat:    Irregular heart rhythm:        Vascular    Pain in calf, thigh, or hip brought on by ambulation:    Pain in feet at night that wakes you up from your sleep:     Blood clot in your veins:    Leg swelling:         Pulmonary    Oxygen at home: X  2 L/min  Productive cough:     Wheezing:         Neurologic    Sudden weakness in arms or legs:     Sudden numbness in arms or legs:     Sudden onset of difficulty speaking or slurred speech:    Temporary loss of vision in one eye:     Problems with dizziness:         Gastrointestinal    Blood in stool:     Vomited blood:         Genitourinary    Burning when urinating:     Blood in urine:        Psychiatric    Major  depression:         Hematologic    Bleeding problems:    Problems with blood clotting too easily:        Skin    Rashes or ulcers:  x  left leg      Constitutional    Fever or chills:     PHYSICAL EXAM:   Vitals:   10/29/17 1111  BP: 138/71  Pulse: 68  Resp: 20  Temp: 97.7 F (36.5 C)  TempSrc: Oral  SpO2: 94%  Weight: 290 lb (131.5 kg)  Height: 4\' 9"  (1.448 m)    GENERAL: The patient is a well-nourished female, in no acute distress. The vital signs are documented above. CARDIAC: There is a regular rate and rhythm.  VASCULAR: I do not detect carotid bruits. The patient is morbidly obese and in a wheelchair and we were unable to get her onto an examining table.  I attempted to palpate her femoral pulses but it was not possible.  I was able to obtain a biphasic dorsalis pedis and biphasic posterior tibial signal with the Doppler. She has hyperpigmentation in the left leg consistent with chronic venous insufficiency. She has moderate left lower extremity swelling. PULMONARY: There is good air exchange bilaterally without wheezing or rales. ABDOMEN: Soft and non-tender with normal pitched bowel sounds.  MUSCULOSKELETAL:  She has a right below the knee amputation. NEUROLOGIC: No focal weakness or paresthesias are detected. SKIN: And also a 2 cm wound on the lateral malleolus of the left leg.  She has a 2 cm diameter wound on the anterior left leg  PSYCHIATRIC: The patient has a normal affect.  DATA:    LEFT LOWER EXTREMITY VENOUS DUPLEX SCAN: I have independently interpreted her left lower extremity venous duplex scan.  There is no evidence of deep venous thrombosis or superficial thrombophlebitis in the left leg.  There is reflux noted in the deep system involving the common femoral vein,, femoral vein, and popliteal vein.  There is some reflux in an anterior accessory left great saphenous vein.  MEDICAL ISSUES:   VENOUS STASIS ULCER: The patient has CEAP- clinical class 6   venous disease.  Based on her venous duplex scan she does have significant deep venous reflux.  However she does not have significant superficial venous reflux.  She has some reflux at the saphenofemoral junction and in the proximal anterior accessory branch but the saphenous vein below that could not even be visualized.  Thus I do not think she is a candidate for any type of venous intervention on the left.  Fortunately, based on my Doppler assessment she appears to have adequate circulation for healing given that she has biphasic Doppler signals.  I would continue aggressive wound care and I think it would be extremely helpful to elevate the leg correctly as much as possible with the back flap, knee slightly bent, and ankle above the knee.  I think she can continue dressing changes with mild compression.  I will be happy to see her back at any time if the ulcers progress.  Deitra Mayo [facility] [phone]

## 2017-10-30 DIAGNOSIS — M6281 Muscle weakness (generalized): Secondary | ICD-10-CM | POA: Diagnosis not present

## 2017-10-30 DIAGNOSIS — I5032 Chronic diastolic (congestive) heart failure: Secondary | ICD-10-CM | POA: Diagnosis not present

## 2017-10-30 DIAGNOSIS — J9620 Acute and chronic respiratory failure, unspecified whether with hypoxia or hypercapnia: Secondary | ICD-10-CM | POA: Diagnosis not present

## 2017-10-31 DIAGNOSIS — M6281 Muscle weakness (generalized): Secondary | ICD-10-CM | POA: Diagnosis not present

## 2017-10-31 DIAGNOSIS — I5032 Chronic diastolic (congestive) heart failure: Secondary | ICD-10-CM | POA: Diagnosis not present

## 2017-10-31 DIAGNOSIS — J9620 Acute and chronic respiratory failure, unspecified whether with hypoxia or hypercapnia: Secondary | ICD-10-CM | POA: Diagnosis not present

## 2017-11-03 DIAGNOSIS — I5032 Chronic diastolic (congestive) heart failure: Secondary | ICD-10-CM | POA: Diagnosis not present

## 2017-11-03 DIAGNOSIS — M6281 Muscle weakness (generalized): Secondary | ICD-10-CM | POA: Diagnosis not present

## 2017-11-03 DIAGNOSIS — J9620 Acute and chronic respiratory failure, unspecified whether with hypoxia or hypercapnia: Secondary | ICD-10-CM | POA: Diagnosis not present

## 2017-11-04 DIAGNOSIS — I83028 Varicose veins of left lower extremity with ulcer other part of lower leg: Secondary | ICD-10-CM | POA: Diagnosis not present

## 2017-11-04 DIAGNOSIS — I5032 Chronic diastolic (congestive) heart failure: Secondary | ICD-10-CM | POA: Diagnosis not present

## 2017-11-04 DIAGNOSIS — J9620 Acute and chronic respiratory failure, unspecified whether with hypoxia or hypercapnia: Secondary | ICD-10-CM | POA: Diagnosis not present

## 2017-11-04 DIAGNOSIS — M6281 Muscle weakness (generalized): Secondary | ICD-10-CM | POA: Diagnosis not present

## 2017-11-05 DIAGNOSIS — I5032 Chronic diastolic (congestive) heart failure: Secondary | ICD-10-CM | POA: Diagnosis not present

## 2017-11-05 DIAGNOSIS — J9620 Acute and chronic respiratory failure, unspecified whether with hypoxia or hypercapnia: Secondary | ICD-10-CM | POA: Diagnosis not present

## 2017-11-05 DIAGNOSIS — M6281 Muscle weakness (generalized): Secondary | ICD-10-CM | POA: Diagnosis not present

## 2017-11-06 DIAGNOSIS — L97929 Non-pressure chronic ulcer of unspecified part of left lower leg with unspecified severity: Secondary | ICD-10-CM | POA: Diagnosis not present

## 2017-11-06 DIAGNOSIS — K219 Gastro-esophageal reflux disease without esophagitis: Secondary | ICD-10-CM | POA: Diagnosis not present

## 2017-11-06 DIAGNOSIS — M6281 Muscle weakness (generalized): Secondary | ICD-10-CM | POA: Diagnosis not present

## 2017-11-06 DIAGNOSIS — I10 Essential (primary) hypertension: Secondary | ICD-10-CM | POA: Diagnosis not present

## 2017-11-06 DIAGNOSIS — E119 Type 2 diabetes mellitus without complications: Secondary | ICD-10-CM | POA: Diagnosis not present

## 2017-11-06 DIAGNOSIS — I5032 Chronic diastolic (congestive) heart failure: Secondary | ICD-10-CM | POA: Diagnosis not present

## 2017-11-06 DIAGNOSIS — J9620 Acute and chronic respiratory failure, unspecified whether with hypoxia or hypercapnia: Secondary | ICD-10-CM | POA: Diagnosis not present

## 2017-11-09 DIAGNOSIS — J9620 Acute and chronic respiratory failure, unspecified whether with hypoxia or hypercapnia: Secondary | ICD-10-CM | POA: Diagnosis not present

## 2017-11-09 DIAGNOSIS — I5032 Chronic diastolic (congestive) heart failure: Secondary | ICD-10-CM | POA: Diagnosis not present

## 2017-11-09 DIAGNOSIS — M6281 Muscle weakness (generalized): Secondary | ICD-10-CM | POA: Diagnosis not present

## 2017-11-10 DIAGNOSIS — I5032 Chronic diastolic (congestive) heart failure: Secondary | ICD-10-CM | POA: Diagnosis not present

## 2017-11-10 DIAGNOSIS — J9620 Acute and chronic respiratory failure, unspecified whether with hypoxia or hypercapnia: Secondary | ICD-10-CM | POA: Diagnosis not present

## 2017-11-10 DIAGNOSIS — M6281 Muscle weakness (generalized): Secondary | ICD-10-CM | POA: Diagnosis not present

## 2017-11-11 DIAGNOSIS — I83028 Varicose veins of left lower extremity with ulcer other part of lower leg: Secondary | ICD-10-CM | POA: Diagnosis not present

## 2017-11-12 DIAGNOSIS — J9620 Acute and chronic respiratory failure, unspecified whether with hypoxia or hypercapnia: Secondary | ICD-10-CM | POA: Diagnosis not present

## 2017-11-12 DIAGNOSIS — M6281 Muscle weakness (generalized): Secondary | ICD-10-CM | POA: Diagnosis not present

## 2017-11-12 DIAGNOSIS — I5032 Chronic diastolic (congestive) heart failure: Secondary | ICD-10-CM | POA: Diagnosis not present

## 2017-11-14 DIAGNOSIS — I5032 Chronic diastolic (congestive) heart failure: Secondary | ICD-10-CM | POA: Diagnosis not present

## 2017-11-14 DIAGNOSIS — J9620 Acute and chronic respiratory failure, unspecified whether with hypoxia or hypercapnia: Secondary | ICD-10-CM | POA: Diagnosis not present

## 2017-11-14 DIAGNOSIS — M6281 Muscle weakness (generalized): Secondary | ICD-10-CM | POA: Diagnosis not present

## 2017-11-17 DIAGNOSIS — J9620 Acute and chronic respiratory failure, unspecified whether with hypoxia or hypercapnia: Secondary | ICD-10-CM | POA: Diagnosis not present

## 2017-11-17 DIAGNOSIS — I5032 Chronic diastolic (congestive) heart failure: Secondary | ICD-10-CM | POA: Diagnosis not present

## 2017-11-17 DIAGNOSIS — M6281 Muscle weakness (generalized): Secondary | ICD-10-CM | POA: Diagnosis not present

## 2017-11-18 DIAGNOSIS — M6281 Muscle weakness (generalized): Secondary | ICD-10-CM | POA: Diagnosis not present

## 2017-11-18 DIAGNOSIS — I5032 Chronic diastolic (congestive) heart failure: Secondary | ICD-10-CM | POA: Diagnosis not present

## 2017-11-18 DIAGNOSIS — I83028 Varicose veins of left lower extremity with ulcer other part of lower leg: Secondary | ICD-10-CM | POA: Diagnosis not present

## 2017-11-18 DIAGNOSIS — J9620 Acute and chronic respiratory failure, unspecified whether with hypoxia or hypercapnia: Secondary | ICD-10-CM | POA: Diagnosis not present

## 2017-11-19 DIAGNOSIS — I10 Essential (primary) hypertension: Secondary | ICD-10-CM | POA: Diagnosis not present

## 2017-11-19 DIAGNOSIS — I5032 Chronic diastolic (congestive) heart failure: Secondary | ICD-10-CM | POA: Diagnosis not present

## 2017-11-19 DIAGNOSIS — J9620 Acute and chronic respiratory failure, unspecified whether with hypoxia or hypercapnia: Secondary | ICD-10-CM | POA: Diagnosis not present

## 2017-11-19 DIAGNOSIS — M6281 Muscle weakness (generalized): Secondary | ICD-10-CM | POA: Diagnosis not present

## 2017-11-19 DIAGNOSIS — E119 Type 2 diabetes mellitus without complications: Secondary | ICD-10-CM | POA: Diagnosis not present

## 2017-11-19 DIAGNOSIS — N183 Chronic kidney disease, stage 3 (moderate): Secondary | ICD-10-CM | POA: Diagnosis not present

## 2017-11-20 DIAGNOSIS — J9620 Acute and chronic respiratory failure, unspecified whether with hypoxia or hypercapnia: Secondary | ICD-10-CM | POA: Diagnosis not present

## 2017-11-20 DIAGNOSIS — M6281 Muscle weakness (generalized): Secondary | ICD-10-CM | POA: Diagnosis not present

## 2017-11-20 DIAGNOSIS — I5032 Chronic diastolic (congestive) heart failure: Secondary | ICD-10-CM | POA: Diagnosis not present

## 2017-11-24 DIAGNOSIS — M6281 Muscle weakness (generalized): Secondary | ICD-10-CM | POA: Diagnosis not present

## 2017-11-24 DIAGNOSIS — J9620 Acute and chronic respiratory failure, unspecified whether with hypoxia or hypercapnia: Secondary | ICD-10-CM | POA: Diagnosis not present

## 2017-11-24 DIAGNOSIS — I5032 Chronic diastolic (congestive) heart failure: Secondary | ICD-10-CM | POA: Diagnosis not present

## 2017-11-25 DIAGNOSIS — I83028 Varicose veins of left lower extremity with ulcer other part of lower leg: Secondary | ICD-10-CM | POA: Diagnosis not present

## 2017-11-25 DIAGNOSIS — M6281 Muscle weakness (generalized): Secondary | ICD-10-CM | POA: Diagnosis not present

## 2017-11-25 DIAGNOSIS — I5032 Chronic diastolic (congestive) heart failure: Secondary | ICD-10-CM | POA: Diagnosis not present

## 2017-11-25 DIAGNOSIS — J9620 Acute and chronic respiratory failure, unspecified whether with hypoxia or hypercapnia: Secondary | ICD-10-CM | POA: Diagnosis not present

## 2017-11-26 DIAGNOSIS — I5032 Chronic diastolic (congestive) heart failure: Secondary | ICD-10-CM | POA: Diagnosis not present

## 2017-11-26 DIAGNOSIS — J9620 Acute and chronic respiratory failure, unspecified whether with hypoxia or hypercapnia: Secondary | ICD-10-CM | POA: Diagnosis not present

## 2017-11-26 DIAGNOSIS — M6281 Muscle weakness (generalized): Secondary | ICD-10-CM | POA: Diagnosis not present

## 2017-11-27 DIAGNOSIS — I5032 Chronic diastolic (congestive) heart failure: Secondary | ICD-10-CM | POA: Diagnosis not present

## 2017-11-27 DIAGNOSIS — M6281 Muscle weakness (generalized): Secondary | ICD-10-CM | POA: Diagnosis not present

## 2017-11-27 DIAGNOSIS — J9620 Acute and chronic respiratory failure, unspecified whether with hypoxia or hypercapnia: Secondary | ICD-10-CM | POA: Diagnosis not present

## 2017-11-28 DIAGNOSIS — M25511 Pain in right shoulder: Secondary | ICD-10-CM | POA: Diagnosis not present

## 2017-12-01 DIAGNOSIS — M25511 Pain in right shoulder: Secondary | ICD-10-CM | POA: Diagnosis not present

## 2017-12-02 DIAGNOSIS — I5032 Chronic diastolic (congestive) heart failure: Secondary | ICD-10-CM | POA: Diagnosis not present

## 2017-12-02 DIAGNOSIS — M6281 Muscle weakness (generalized): Secondary | ICD-10-CM | POA: Diagnosis not present

## 2017-12-02 DIAGNOSIS — I83028 Varicose veins of left lower extremity with ulcer other part of lower leg: Secondary | ICD-10-CM | POA: Diagnosis not present

## 2017-12-02 DIAGNOSIS — J9620 Acute and chronic respiratory failure, unspecified whether with hypoxia or hypercapnia: Secondary | ICD-10-CM | POA: Diagnosis not present

## 2017-12-03 DIAGNOSIS — I5032 Chronic diastolic (congestive) heart failure: Secondary | ICD-10-CM | POA: Diagnosis not present

## 2017-12-03 DIAGNOSIS — M6281 Muscle weakness (generalized): Secondary | ICD-10-CM | POA: Diagnosis not present

## 2017-12-03 DIAGNOSIS — J9620 Acute and chronic respiratory failure, unspecified whether with hypoxia or hypercapnia: Secondary | ICD-10-CM | POA: Diagnosis not present

## 2017-12-04 DIAGNOSIS — J9620 Acute and chronic respiratory failure, unspecified whether with hypoxia or hypercapnia: Secondary | ICD-10-CM | POA: Diagnosis not present

## 2017-12-04 DIAGNOSIS — I5032 Chronic diastolic (congestive) heart failure: Secondary | ICD-10-CM | POA: Diagnosis not present

## 2017-12-04 DIAGNOSIS — E119 Type 2 diabetes mellitus without complications: Secondary | ICD-10-CM | POA: Diagnosis not present

## 2017-12-04 DIAGNOSIS — E559 Vitamin D deficiency, unspecified: Secondary | ICD-10-CM | POA: Diagnosis not present

## 2017-12-04 DIAGNOSIS — L97929 Non-pressure chronic ulcer of unspecified part of left lower leg with unspecified severity: Secondary | ICD-10-CM | POA: Diagnosis not present

## 2017-12-04 DIAGNOSIS — M6281 Muscle weakness (generalized): Secondary | ICD-10-CM | POA: Diagnosis not present

## 2017-12-04 DIAGNOSIS — I872 Venous insufficiency (chronic) (peripheral): Secondary | ICD-10-CM | POA: Diagnosis not present

## 2017-12-09 DIAGNOSIS — I83022 Varicose veins of left lower extremity with ulcer of calf: Secondary | ICD-10-CM | POA: Diagnosis not present

## 2017-12-11 DIAGNOSIS — M25511 Pain in right shoulder: Secondary | ICD-10-CM | POA: Diagnosis not present

## 2017-12-16 DIAGNOSIS — I83028 Varicose veins of left lower extremity with ulcer other part of lower leg: Secondary | ICD-10-CM | POA: Diagnosis not present

## 2017-12-16 DIAGNOSIS — I83022 Varicose veins of left lower extremity with ulcer of calf: Secondary | ICD-10-CM | POA: Diagnosis not present

## 2017-12-17 DIAGNOSIS — I10 Essential (primary) hypertension: Secondary | ICD-10-CM | POA: Diagnosis not present

## 2017-12-17 DIAGNOSIS — M25511 Pain in right shoulder: Secondary | ICD-10-CM | POA: Diagnosis not present

## 2017-12-17 DIAGNOSIS — N183 Chronic kidney disease, stage 3 (moderate): Secondary | ICD-10-CM | POA: Diagnosis not present

## 2017-12-17 DIAGNOSIS — I5032 Chronic diastolic (congestive) heart failure: Secondary | ICD-10-CM | POA: Diagnosis not present

## 2017-12-17 DIAGNOSIS — G4733 Obstructive sleep apnea (adult) (pediatric): Secondary | ICD-10-CM | POA: Diagnosis not present

## 2017-12-18 DIAGNOSIS — D649 Anemia, unspecified: Secondary | ICD-10-CM | POA: Diagnosis not present

## 2017-12-18 DIAGNOSIS — E119 Type 2 diabetes mellitus without complications: Secondary | ICD-10-CM | POA: Diagnosis not present

## 2017-12-23 DIAGNOSIS — I83028 Varicose veins of left lower extremity with ulcer other part of lower leg: Secondary | ICD-10-CM | POA: Diagnosis not present

## 2018-01-01 DIAGNOSIS — I872 Venous insufficiency (chronic) (peripheral): Secondary | ICD-10-CM | POA: Diagnosis not present

## 2018-01-01 DIAGNOSIS — E13622 Other specified diabetes mellitus with other skin ulcer: Secondary | ICD-10-CM | POA: Diagnosis not present

## 2018-01-01 DIAGNOSIS — Z89511 Acquired absence of right leg below knee: Secondary | ICD-10-CM | POA: Diagnosis not present

## 2018-01-01 DIAGNOSIS — J449 Chronic obstructive pulmonary disease, unspecified: Secondary | ICD-10-CM | POA: Diagnosis not present

## 2018-01-01 DIAGNOSIS — E119 Type 2 diabetes mellitus without complications: Secondary | ICD-10-CM | POA: Diagnosis not present

## 2018-01-01 DIAGNOSIS — M6281 Muscle weakness (generalized): Secondary | ICD-10-CM | POA: Diagnosis not present

## 2018-01-03 DIAGNOSIS — K219 Gastro-esophageal reflux disease without esophagitis: Secondary | ICD-10-CM | POA: Diagnosis not present

## 2018-01-03 DIAGNOSIS — L97929 Non-pressure chronic ulcer of unspecified part of left lower leg with unspecified severity: Secondary | ICD-10-CM | POA: Diagnosis not present

## 2018-01-03 DIAGNOSIS — E119 Type 2 diabetes mellitus without complications: Secondary | ICD-10-CM | POA: Diagnosis not present

## 2018-01-03 DIAGNOSIS — I5032 Chronic diastolic (congestive) heart failure: Secondary | ICD-10-CM | POA: Diagnosis not present

## 2018-01-06 DIAGNOSIS — E119 Type 2 diabetes mellitus without complications: Secondary | ICD-10-CM | POA: Diagnosis not present

## 2018-01-06 DIAGNOSIS — E13622 Other specified diabetes mellitus with other skin ulcer: Secondary | ICD-10-CM | POA: Diagnosis not present

## 2018-01-06 DIAGNOSIS — M6281 Muscle weakness (generalized): Secondary | ICD-10-CM | POA: Diagnosis not present

## 2018-01-06 DIAGNOSIS — I872 Venous insufficiency (chronic) (peripheral): Secondary | ICD-10-CM | POA: Diagnosis not present

## 2018-01-06 DIAGNOSIS — J449 Chronic obstructive pulmonary disease, unspecified: Secondary | ICD-10-CM | POA: Diagnosis not present

## 2018-01-06 DIAGNOSIS — Z89511 Acquired absence of right leg below knee: Secondary | ICD-10-CM | POA: Diagnosis not present

## 2018-01-13 DIAGNOSIS — I872 Venous insufficiency (chronic) (peripheral): Secondary | ICD-10-CM | POA: Diagnosis not present

## 2018-01-13 DIAGNOSIS — J449 Chronic obstructive pulmonary disease, unspecified: Secondary | ICD-10-CM | POA: Diagnosis not present

## 2018-01-13 DIAGNOSIS — E13622 Other specified diabetes mellitus with other skin ulcer: Secondary | ICD-10-CM | POA: Diagnosis not present

## 2018-01-13 DIAGNOSIS — M6281 Muscle weakness (generalized): Secondary | ICD-10-CM | POA: Diagnosis not present

## 2018-01-13 DIAGNOSIS — E119 Type 2 diabetes mellitus without complications: Secondary | ICD-10-CM | POA: Diagnosis not present

## 2018-01-13 DIAGNOSIS — Z89511 Acquired absence of right leg below knee: Secondary | ICD-10-CM | POA: Diagnosis not present

## 2018-01-14 DIAGNOSIS — E119 Type 2 diabetes mellitus without complications: Secondary | ICD-10-CM | POA: Diagnosis not present

## 2018-01-14 DIAGNOSIS — N183 Chronic kidney disease, stage 3 (moderate): Secondary | ICD-10-CM | POA: Diagnosis not present

## 2018-01-14 DIAGNOSIS — I5032 Chronic diastolic (congestive) heart failure: Secondary | ICD-10-CM | POA: Diagnosis not present

## 2018-01-14 DIAGNOSIS — I10 Essential (primary) hypertension: Secondary | ICD-10-CM | POA: Diagnosis not present

## 2018-01-15 DIAGNOSIS — M25511 Pain in right shoulder: Secondary | ICD-10-CM | POA: Diagnosis not present

## 2018-01-20 DIAGNOSIS — I872 Venous insufficiency (chronic) (peripheral): Secondary | ICD-10-CM | POA: Diagnosis not present

## 2018-01-20 DIAGNOSIS — M6281 Muscle weakness (generalized): Secondary | ICD-10-CM | POA: Diagnosis not present

## 2018-01-20 DIAGNOSIS — E119 Type 2 diabetes mellitus without complications: Secondary | ICD-10-CM | POA: Diagnosis not present

## 2018-01-20 DIAGNOSIS — I10 Essential (primary) hypertension: Secondary | ICD-10-CM | POA: Diagnosis not present

## 2018-01-26 DIAGNOSIS — E119 Type 2 diabetes mellitus without complications: Secondary | ICD-10-CM | POA: Diagnosis not present

## 2018-01-26 DIAGNOSIS — G4733 Obstructive sleep apnea (adult) (pediatric): Secondary | ICD-10-CM | POA: Diagnosis not present

## 2018-01-26 DIAGNOSIS — I5032 Chronic diastolic (congestive) heart failure: Secondary | ICD-10-CM | POA: Diagnosis not present

## 2018-01-26 DIAGNOSIS — R0602 Shortness of breath: Secondary | ICD-10-CM | POA: Diagnosis not present

## 2018-01-27 DIAGNOSIS — M6281 Muscle weakness (generalized): Secondary | ICD-10-CM | POA: Diagnosis not present

## 2018-01-27 DIAGNOSIS — Z89511 Acquired absence of right leg below knee: Secondary | ICD-10-CM | POA: Diagnosis not present

## 2018-01-27 DIAGNOSIS — E119 Type 2 diabetes mellitus without complications: Secondary | ICD-10-CM | POA: Diagnosis not present

## 2018-01-27 DIAGNOSIS — I872 Venous insufficiency (chronic) (peripheral): Secondary | ICD-10-CM | POA: Diagnosis not present

## 2018-01-29 DIAGNOSIS — G4733 Obstructive sleep apnea (adult) (pediatric): Secondary | ICD-10-CM | POA: Diagnosis not present

## 2018-01-29 DIAGNOSIS — R0602 Shortness of breath: Secondary | ICD-10-CM | POA: Diagnosis not present

## 2018-01-29 DIAGNOSIS — E119 Type 2 diabetes mellitus without complications: Secondary | ICD-10-CM | POA: Diagnosis not present

## 2018-01-29 DIAGNOSIS — I10 Essential (primary) hypertension: Secondary | ICD-10-CM | POA: Diagnosis not present

## 2018-02-03 DIAGNOSIS — E119 Type 2 diabetes mellitus without complications: Secondary | ICD-10-CM | POA: Diagnosis not present

## 2018-02-03 DIAGNOSIS — I872 Venous insufficiency (chronic) (peripheral): Secondary | ICD-10-CM | POA: Diagnosis not present

## 2018-02-03 DIAGNOSIS — M6281 Muscle weakness (generalized): Secondary | ICD-10-CM | POA: Diagnosis not present

## 2018-02-03 DIAGNOSIS — Z89511 Acquired absence of right leg below knee: Secondary | ICD-10-CM | POA: Diagnosis not present

## 2018-02-10 DIAGNOSIS — E119 Type 2 diabetes mellitus without complications: Secondary | ICD-10-CM | POA: Diagnosis not present

## 2018-02-10 DIAGNOSIS — M6281 Muscle weakness (generalized): Secondary | ICD-10-CM | POA: Diagnosis not present

## 2018-02-10 DIAGNOSIS — Z89511 Acquired absence of right leg below knee: Secondary | ICD-10-CM | POA: Diagnosis not present

## 2018-02-10 DIAGNOSIS — I872 Venous insufficiency (chronic) (peripheral): Secondary | ICD-10-CM | POA: Diagnosis not present

## 2018-02-13 DIAGNOSIS — F331 Major depressive disorder, recurrent, moderate: Secondary | ICD-10-CM | POA: Diagnosis not present

## 2018-02-13 DIAGNOSIS — F423 Hoarding disorder: Secondary | ICD-10-CM | POA: Diagnosis not present

## 2018-02-16 DIAGNOSIS — I872 Venous insufficiency (chronic) (peripheral): Secondary | ICD-10-CM | POA: Diagnosis not present

## 2018-02-16 DIAGNOSIS — E119 Type 2 diabetes mellitus without complications: Secondary | ICD-10-CM | POA: Diagnosis not present

## 2018-02-16 DIAGNOSIS — I10 Essential (primary) hypertension: Secondary | ICD-10-CM | POA: Diagnosis not present

## 2018-02-16 DIAGNOSIS — I5032 Chronic diastolic (congestive) heart failure: Secondary | ICD-10-CM | POA: Diagnosis not present

## 2018-02-17 DIAGNOSIS — I10 Essential (primary) hypertension: Secondary | ICD-10-CM | POA: Diagnosis not present

## 2018-02-17 DIAGNOSIS — Z89511 Acquired absence of right leg below knee: Secondary | ICD-10-CM | POA: Diagnosis not present

## 2018-02-17 DIAGNOSIS — M6281 Muscle weakness (generalized): Secondary | ICD-10-CM | POA: Diagnosis not present

## 2018-02-17 DIAGNOSIS — E119 Type 2 diabetes mellitus without complications: Secondary | ICD-10-CM | POA: Diagnosis not present

## 2018-02-17 DIAGNOSIS — I872 Venous insufficiency (chronic) (peripheral): Secondary | ICD-10-CM | POA: Diagnosis not present

## 2018-02-24 DIAGNOSIS — I872 Venous insufficiency (chronic) (peripheral): Secondary | ICD-10-CM | POA: Diagnosis not present

## 2018-02-24 DIAGNOSIS — I10 Essential (primary) hypertension: Secondary | ICD-10-CM | POA: Diagnosis not present

## 2018-02-24 DIAGNOSIS — Z89511 Acquired absence of right leg below knee: Secondary | ICD-10-CM | POA: Diagnosis not present

## 2018-02-24 DIAGNOSIS — E119 Type 2 diabetes mellitus without complications: Secondary | ICD-10-CM | POA: Diagnosis not present

## 2018-02-24 DIAGNOSIS — M6281 Muscle weakness (generalized): Secondary | ICD-10-CM | POA: Diagnosis not present

## 2018-02-26 DIAGNOSIS — K219 Gastro-esophageal reflux disease without esophagitis: Secondary | ICD-10-CM | POA: Diagnosis not present

## 2018-02-26 DIAGNOSIS — R0602 Shortness of breath: Secondary | ICD-10-CM | POA: Diagnosis not present

## 2018-02-26 DIAGNOSIS — I5032 Chronic diastolic (congestive) heart failure: Secondary | ICD-10-CM | POA: Diagnosis not present

## 2018-02-26 DIAGNOSIS — E119 Type 2 diabetes mellitus without complications: Secondary | ICD-10-CM | POA: Diagnosis not present

## 2018-03-03 DIAGNOSIS — E119 Type 2 diabetes mellitus without complications: Secondary | ICD-10-CM | POA: Diagnosis not present

## 2018-03-03 DIAGNOSIS — Z89511 Acquired absence of right leg below knee: Secondary | ICD-10-CM | POA: Diagnosis not present

## 2018-03-03 DIAGNOSIS — I10 Essential (primary) hypertension: Secondary | ICD-10-CM | POA: Diagnosis not present

## 2018-03-03 DIAGNOSIS — M6281 Muscle weakness (generalized): Secondary | ICD-10-CM | POA: Diagnosis not present

## 2018-03-03 DIAGNOSIS — I872 Venous insufficiency (chronic) (peripheral): Secondary | ICD-10-CM | POA: Diagnosis not present

## 2018-03-08 DIAGNOSIS — R109 Unspecified abdominal pain: Secondary | ICD-10-CM | POA: Diagnosis not present

## 2018-03-10 DIAGNOSIS — I872 Venous insufficiency (chronic) (peripheral): Secondary | ICD-10-CM | POA: Diagnosis not present

## 2018-03-10 DIAGNOSIS — I10 Essential (primary) hypertension: Secondary | ICD-10-CM | POA: Diagnosis not present

## 2018-03-10 DIAGNOSIS — E119 Type 2 diabetes mellitus without complications: Secondary | ICD-10-CM | POA: Diagnosis not present

## 2018-03-10 DIAGNOSIS — M6281 Muscle weakness (generalized): Secondary | ICD-10-CM | POA: Diagnosis not present

## 2018-03-10 DIAGNOSIS — Z89511 Acquired absence of right leg below knee: Secondary | ICD-10-CM | POA: Diagnosis not present

## 2018-03-14 DIAGNOSIS — J9600 Acute respiratory failure, unspecified whether with hypoxia or hypercapnia: Secondary | ICD-10-CM

## 2018-03-14 HISTORY — DX: Acute respiratory failure, unspecified whether with hypoxia or hypercapnia: J96.00

## 2018-03-16 DIAGNOSIS — I5032 Chronic diastolic (congestive) heart failure: Secondary | ICD-10-CM | POA: Diagnosis not present

## 2018-03-16 DIAGNOSIS — G4733 Obstructive sleep apnea (adult) (pediatric): Secondary | ICD-10-CM | POA: Diagnosis not present

## 2018-03-16 DIAGNOSIS — E119 Type 2 diabetes mellitus without complications: Secondary | ICD-10-CM | POA: Diagnosis not present

## 2018-03-16 DIAGNOSIS — I10 Essential (primary) hypertension: Secondary | ICD-10-CM | POA: Diagnosis not present

## 2018-03-17 DIAGNOSIS — A419 Sepsis, unspecified organism: Secondary | ICD-10-CM | POA: Diagnosis not present

## 2018-03-17 DIAGNOSIS — M6281 Muscle weakness (generalized): Secondary | ICD-10-CM | POA: Diagnosis not present

## 2018-03-17 DIAGNOSIS — Z89511 Acquired absence of right leg below knee: Secondary | ICD-10-CM | POA: Diagnosis not present

## 2018-03-17 DIAGNOSIS — E785 Hyperlipidemia, unspecified: Secondary | ICD-10-CM | POA: Diagnosis not present

## 2018-03-17 DIAGNOSIS — I10 Essential (primary) hypertension: Secondary | ICD-10-CM | POA: Diagnosis not present

## 2018-03-17 DIAGNOSIS — E119 Type 2 diabetes mellitus without complications: Secondary | ICD-10-CM | POA: Diagnosis not present

## 2018-03-17 DIAGNOSIS — D649 Anemia, unspecified: Secondary | ICD-10-CM | POA: Diagnosis not present

## 2018-03-17 DIAGNOSIS — I5032 Chronic diastolic (congestive) heart failure: Secondary | ICD-10-CM | POA: Diagnosis not present

## 2018-03-17 DIAGNOSIS — I872 Venous insufficiency (chronic) (peripheral): Secondary | ICD-10-CM | POA: Diagnosis not present

## 2018-03-24 DIAGNOSIS — I872 Venous insufficiency (chronic) (peripheral): Secondary | ICD-10-CM | POA: Diagnosis not present

## 2018-03-24 DIAGNOSIS — I10 Essential (primary) hypertension: Secondary | ICD-10-CM | POA: Diagnosis not present

## 2018-03-24 DIAGNOSIS — M6281 Muscle weakness (generalized): Secondary | ICD-10-CM | POA: Diagnosis not present

## 2018-03-24 DIAGNOSIS — E119 Type 2 diabetes mellitus without complications: Secondary | ICD-10-CM | POA: Diagnosis not present

## 2018-03-24 DIAGNOSIS — Z89511 Acquired absence of right leg below knee: Secondary | ICD-10-CM | POA: Diagnosis not present

## 2018-03-26 DIAGNOSIS — L97929 Non-pressure chronic ulcer of unspecified part of left lower leg with unspecified severity: Secondary | ICD-10-CM | POA: Diagnosis not present

## 2018-03-26 DIAGNOSIS — E119 Type 2 diabetes mellitus without complications: Secondary | ICD-10-CM | POA: Diagnosis not present

## 2018-03-26 DIAGNOSIS — I10 Essential (primary) hypertension: Secondary | ICD-10-CM | POA: Diagnosis not present

## 2018-03-26 DIAGNOSIS — G4733 Obstructive sleep apnea (adult) (pediatric): Secondary | ICD-10-CM | POA: Diagnosis not present

## 2018-03-31 ENCOUNTER — Encounter (HOSPITAL_COMMUNITY): Payer: Self-pay | Admitting: Emergency Medicine

## 2018-03-31 ENCOUNTER — Inpatient Hospital Stay (HOSPITAL_COMMUNITY): Payer: Medicare Other

## 2018-03-31 ENCOUNTER — Inpatient Hospital Stay (HOSPITAL_COMMUNITY)
Admission: AD | Admit: 2018-03-31 | Discharge: 2018-04-12 | DRG: 870 | Disposition: A | Discharge status: Skilled Nursing Facility | Payer: Medicare Other | Attending: Family Medicine | Admitting: Family Medicine

## 2018-03-31 ENCOUNTER — Emergency Department (HOSPITAL_COMMUNITY): Payer: Medicare Other

## 2018-03-31 DIAGNOSIS — R0902 Hypoxemia: Secondary | ICD-10-CM | POA: Diagnosis not present

## 2018-03-31 DIAGNOSIS — Z888 Allergy status to other drugs, medicaments and biological substances status: Secondary | ICD-10-CM

## 2018-03-31 DIAGNOSIS — G9341 Metabolic encephalopathy: Secondary | ICD-10-CM | POA: Diagnosis present

## 2018-03-31 DIAGNOSIS — Z1624 Resistance to multiple antibiotics: Secondary | ICD-10-CM | POA: Diagnosis present

## 2018-03-31 DIAGNOSIS — R000 Tachycardia, unspecified: Secondary | ICD-10-CM | POA: Diagnosis not present

## 2018-03-31 DIAGNOSIS — J449 Chronic obstructive pulmonary disease, unspecified: Secondary | ICD-10-CM | POA: Diagnosis present

## 2018-03-31 DIAGNOSIS — E876 Hypokalemia: Secondary | ICD-10-CM | POA: Diagnosis not present

## 2018-03-31 DIAGNOSIS — I10 Essential (primary) hypertension: Secondary | ICD-10-CM | POA: Diagnosis not present

## 2018-03-31 DIAGNOSIS — Z7982 Long term (current) use of aspirin: Secondary | ICD-10-CM

## 2018-03-31 DIAGNOSIS — G4733 Obstructive sleep apnea (adult) (pediatric): Secondary | ICD-10-CM | POA: Diagnosis present

## 2018-03-31 DIAGNOSIS — I130 Hypertensive heart and chronic kidney disease with heart failure and stage 1 through stage 4 chronic kidney disease, or unspecified chronic kidney disease: Secondary | ICD-10-CM | POA: Diagnosis present

## 2018-03-31 DIAGNOSIS — E1165 Type 2 diabetes mellitus with hyperglycemia: Secondary | ICD-10-CM | POA: Diagnosis not present

## 2018-03-31 DIAGNOSIS — M2450 Contracture, unspecified joint: Secondary | ICD-10-CM | POA: Diagnosis present

## 2018-03-31 DIAGNOSIS — Z6841 Body Mass Index (BMI) 40.0 and over, adult: Secondary | ICD-10-CM | POA: Diagnosis not present

## 2018-03-31 DIAGNOSIS — Z4659 Encounter for fitting and adjustment of other gastrointestinal appliance and device: Secondary | ICD-10-CM

## 2018-03-31 DIAGNOSIS — J9690 Respiratory failure, unspecified, unspecified whether with hypoxia or hypercapnia: Secondary | ICD-10-CM | POA: Diagnosis not present

## 2018-03-31 DIAGNOSIS — R4781 Slurred speech: Secondary | ICD-10-CM | POA: Diagnosis present

## 2018-03-31 DIAGNOSIS — I517 Cardiomegaly: Secondary | ICD-10-CM | POA: Diagnosis not present

## 2018-03-31 DIAGNOSIS — M2550 Pain in unspecified joint: Secondary | ICD-10-CM | POA: Diagnosis not present

## 2018-03-31 DIAGNOSIS — D631 Anemia in chronic kidney disease: Secondary | ICD-10-CM | POA: Diagnosis present

## 2018-03-31 DIAGNOSIS — K219 Gastro-esophageal reflux disease without esophagitis: Secondary | ICD-10-CM | POA: Diagnosis present

## 2018-03-31 DIAGNOSIS — E1122 Type 2 diabetes mellitus with diabetic chronic kidney disease: Secondary | ICD-10-CM | POA: Diagnosis present

## 2018-03-31 DIAGNOSIS — E872 Acidosis: Secondary | ICD-10-CM | POA: Diagnosis present

## 2018-03-31 DIAGNOSIS — J9601 Acute respiratory failure with hypoxia: Secondary | ICD-10-CM

## 2018-03-31 DIAGNOSIS — K5900 Constipation, unspecified: Secondary | ICD-10-CM | POA: Diagnosis not present

## 2018-03-31 DIAGNOSIS — R4182 Altered mental status, unspecified: Secondary | ICD-10-CM | POA: Diagnosis not present

## 2018-03-31 DIAGNOSIS — Z87440 Personal history of urinary (tract) infections: Secondary | ICD-10-CM

## 2018-03-31 DIAGNOSIS — R61 Generalized hyperhidrosis: Secondary | ICD-10-CM | POA: Diagnosis not present

## 2018-03-31 DIAGNOSIS — Z79899 Other long term (current) drug therapy: Secondary | ICD-10-CM

## 2018-03-31 DIAGNOSIS — A4101 Sepsis due to Methicillin susceptible Staphylococcus aureus: Secondary | ICD-10-CM | POA: Diagnosis not present

## 2018-03-31 DIAGNOSIS — J90 Pleural effusion, not elsewhere classified: Secondary | ICD-10-CM | POA: Diagnosis not present

## 2018-03-31 DIAGNOSIS — G8929 Other chronic pain: Secondary | ICD-10-CM | POA: Diagnosis present

## 2018-03-31 DIAGNOSIS — I5032 Chronic diastolic (congestive) heart failure: Secondary | ICD-10-CM | POA: Diagnosis present

## 2018-03-31 DIAGNOSIS — E870 Hyperosmolality and hypernatremia: Secondary | ICD-10-CM | POA: Diagnosis present

## 2018-03-31 DIAGNOSIS — I2781 Cor pulmonale (chronic): Secondary | ICD-10-CM | POA: Diagnosis present

## 2018-03-31 DIAGNOSIS — J309 Allergic rhinitis, unspecified: Secondary | ICD-10-CM | POA: Diagnosis present

## 2018-03-31 DIAGNOSIS — I5033 Acute on chronic diastolic (congestive) heart failure: Secondary | ICD-10-CM | POA: Diagnosis present

## 2018-03-31 DIAGNOSIS — I491 Atrial premature depolarization: Secondary | ICD-10-CM | POA: Diagnosis not present

## 2018-03-31 DIAGNOSIS — I872 Venous insufficiency (chronic) (peripheral): Secondary | ICD-10-CM | POA: Diagnosis present

## 2018-03-31 DIAGNOSIS — Z89511 Acquired absence of right leg below knee: Secondary | ICD-10-CM

## 2018-03-31 DIAGNOSIS — Z9981 Dependence on supplemental oxygen: Secondary | ICD-10-CM

## 2018-03-31 DIAGNOSIS — Z9911 Dependence on respirator [ventilator] status: Secondary | ICD-10-CM | POA: Diagnosis not present

## 2018-03-31 DIAGNOSIS — J811 Chronic pulmonary edema: Secondary | ICD-10-CM

## 2018-03-31 DIAGNOSIS — I878 Other specified disorders of veins: Secondary | ICD-10-CM | POA: Diagnosis present

## 2018-03-31 DIAGNOSIS — I509 Heart failure, unspecified: Secondary | ICD-10-CM | POA: Diagnosis not present

## 2018-03-31 DIAGNOSIS — J9602 Acute respiratory failure with hypercapnia: Secondary | ICD-10-CM | POA: Diagnosis not present

## 2018-03-31 DIAGNOSIS — J9621 Acute and chronic respiratory failure with hypoxia: Secondary | ICD-10-CM | POA: Diagnosis present

## 2018-03-31 DIAGNOSIS — F329 Major depressive disorder, single episode, unspecified: Secondary | ICD-10-CM | POA: Diagnosis present

## 2018-03-31 DIAGNOSIS — R918 Other nonspecific abnormal finding of lung field: Secondary | ICD-10-CM | POA: Diagnosis not present

## 2018-03-31 DIAGNOSIS — J189 Pneumonia, unspecified organism: Secondary | ICD-10-CM | POA: Diagnosis present

## 2018-03-31 DIAGNOSIS — I87312 Chronic venous hypertension (idiopathic) with ulcer of left lower extremity: Secondary | ICD-10-CM | POA: Diagnosis present

## 2018-03-31 DIAGNOSIS — Z7951 Long term (current) use of inhaled steroids: Secondary | ICD-10-CM

## 2018-03-31 DIAGNOSIS — E1151 Type 2 diabetes mellitus with diabetic peripheral angiopathy without gangrene: Secondary | ICD-10-CM | POA: Diagnosis present

## 2018-03-31 DIAGNOSIS — J181 Lobar pneumonia, unspecified organism: Secondary | ICD-10-CM | POA: Diagnosis not present

## 2018-03-31 DIAGNOSIS — N179 Acute kidney failure, unspecified: Secondary | ICD-10-CM | POA: Diagnosis present

## 2018-03-31 DIAGNOSIS — E119 Type 2 diabetes mellitus without complications: Secondary | ICD-10-CM | POA: Diagnosis present

## 2018-03-31 DIAGNOSIS — L98499 Non-pressure chronic ulcer of skin of other sites with unspecified severity: Secondary | ICD-10-CM | POA: Diagnosis present

## 2018-03-31 DIAGNOSIS — Z4682 Encounter for fitting and adjustment of non-vascular catheter: Secondary | ICD-10-CM | POA: Diagnosis not present

## 2018-03-31 DIAGNOSIS — Y95 Nosocomial condition: Secondary | ICD-10-CM | POA: Diagnosis present

## 2018-03-31 DIAGNOSIS — R402441 Other coma, without documented Glasgow coma scale score, or with partial score reported, in the field [EMT or ambulance]: Secondary | ICD-10-CM | POA: Diagnosis not present

## 2018-03-31 DIAGNOSIS — E11622 Type 2 diabetes mellitus with other skin ulcer: Secondary | ICD-10-CM | POA: Diagnosis present

## 2018-03-31 DIAGNOSIS — R0602 Shortness of breath: Secondary | ICD-10-CM | POA: Diagnosis not present

## 2018-03-31 DIAGNOSIS — J9622 Acute and chronic respiratory failure with hypercapnia: Secondary | ICD-10-CM | POA: Diagnosis present

## 2018-03-31 DIAGNOSIS — Z794 Long term (current) use of insulin: Secondary | ICD-10-CM

## 2018-03-31 DIAGNOSIS — N183 Chronic kidney disease, stage 3 (moderate): Secondary | ICD-10-CM | POA: Diagnosis present

## 2018-03-31 DIAGNOSIS — E875 Hyperkalemia: Secondary | ICD-10-CM | POA: Diagnosis present

## 2018-03-31 DIAGNOSIS — R293 Abnormal posture: Secondary | ICD-10-CM | POA: Diagnosis present

## 2018-03-31 DIAGNOSIS — Z978 Presence of other specified devices: Secondary | ICD-10-CM

## 2018-03-31 DIAGNOSIS — Z22322 Carrier or suspected carrier of Methicillin resistant Staphylococcus aureus: Secondary | ICD-10-CM

## 2018-03-31 DIAGNOSIS — J9620 Acute and chronic respiratory failure, unspecified whether with hypoxia or hypercapnia: Secondary | ICD-10-CM | POA: Diagnosis present

## 2018-03-31 DIAGNOSIS — Z7401 Bed confinement status: Secondary | ICD-10-CM | POA: Diagnosis not present

## 2018-03-31 DIAGNOSIS — J810 Acute pulmonary edema: Secondary | ICD-10-CM | POA: Diagnosis not present

## 2018-03-31 DIAGNOSIS — M6281 Muscle weakness (generalized): Secondary | ICD-10-CM | POA: Diagnosis present

## 2018-03-31 DIAGNOSIS — E559 Vitamin D deficiency, unspecified: Secondary | ICD-10-CM | POA: Diagnosis present

## 2018-03-31 DIAGNOSIS — R1312 Dysphagia, oropharyngeal phase: Secondary | ICD-10-CM | POA: Diagnosis present

## 2018-03-31 HISTORY — DX: Acute respiratory failure, unspecified whether with hypoxia or hypercapnia: J96.00

## 2018-03-31 LAB — BLOOD GAS, ARTERIAL
ACID-BASE EXCESS: 7 mmol/L — AB (ref 0.0–2.0)
Acid-Base Excess: 7.8 mmol/L — ABNORMAL HIGH (ref 0.0–2.0)
Bicarbonate: 36 mmol/L — ABNORMAL HIGH (ref 20.0–28.0)
Bicarbonate: 34.9 mmol/L — ABNORMAL HIGH (ref 20.0–28.0)
DELIVERY SYSTEMS: POSITIVE
DELIVERY SYSTEMS: POSITIVE
Drawn by: 301361
Drawn by: 301361
EXPIRATORY PAP: 8
Expiratory PAP: 12
FIO2: 60
FIO2: 45
INSPIRATORY PAP: 26
Inspiratory PAP: 20
O2 SAT: 96.2 %
O2 Saturation: 92.2 %
PCO2 ART: 102 mmHg — AB (ref 32.0–48.0)
PH ART: 7.174 — AB (ref 7.350–7.450)
PO2 ART: 98.6 mmHg (ref 83.0–108.0)
Patient temperature: 98.6
Patient temperature: 98.7
pCO2 arterial: 95.8 mmHg (ref 32.0–48.0)
pH, Arterial: 7.188 — CL (ref 7.350–7.450)
pO2, Arterial: 74 mmHg — ABNORMAL LOW (ref 83.0–108.0)

## 2018-03-31 LAB — URINALYSIS, ROUTINE W REFLEX MICROSCOPIC
BILIRUBIN URINE: NEGATIVE
Glucose, UA: NEGATIVE mg/dL
Ketones, ur: NEGATIVE mg/dL
Nitrite: POSITIVE — AB
Protein, ur: 100 mg/dL — AB
SPECIFIC GRAVITY, URINE: 1.01 (ref 1.005–1.030)
WBC, UA: >50 WBC/hpf — ABNORMAL HIGH (ref 0–5)
pH: 5 (ref 5.0–8.0)

## 2018-03-31 LAB — BASIC METABOLIC PANEL
ANION GAP: 7 (ref 5–15)
BUN: 32 mg/dL — ABNORMAL HIGH (ref 6–20)
CO2: 33 mmol/L — ABNORMAL HIGH (ref 22–32)
Calcium: 8.7 mg/dL — ABNORMAL LOW (ref 8.9–10.3)
Chloride: 102 mmol/L (ref 101–111)
Creatinine, Ser: 1.63 mg/dL — ABNORMAL HIGH (ref 0.44–1.00)
GFR, EST AFRICAN AMERICAN: 35 mL/min — AB (ref >60)
GFR, EST NON AFRICAN AMERICAN: 30 mL/min — AB (ref >60)
Glucose, Bld: 150 mg/dL — ABNORMAL HIGH (ref 65–99)
POTASSIUM: 6 mmol/L — AB (ref 3.5–5.1)
SODIUM: 142 mmol/L (ref 135–145)

## 2018-03-31 LAB — CBC WITH DIFFERENTIAL/PLATELET
Abs Immature Granulocytes: 0 10*3/uL (ref 0.0–0.1)
Basophils Absolute: 0 10*3/uL (ref 0.0–0.1)
Basophils Relative: 0 %
EOS ABS: 0.1 10*3/uL (ref 0.0–0.7)
EOS PCT: 1 %
HEMATOCRIT: 43.6 % (ref 36.0–46.0)
HEMOGLOBIN: 11.8 g/dL — AB (ref 12.0–15.0)
Immature Granulocytes: 0 %
LYMPHS ABS: 1.3 10*3/uL (ref 0.7–4.0)
Lymphocytes Relative: 14 %
MCH: 24.9 pg — AB (ref 26.0–34.0)
MCHC: 27.1 g/dL — AB (ref 30.0–36.0)
MCV: 92.2 fL (ref 78.0–100.0)
MONO ABS: 1.2 10*3/uL — AB (ref 0.1–1.0)
Monocytes Relative: 12 %
Neutro Abs: 7.2 10*3/uL (ref 1.7–7.7)
Neutrophils Relative %: 73 %
Platelets: 229 10*3/uL (ref 150–400)
RBC: 4.73 MIL/uL (ref 3.87–5.11)
RDW: 20.6 % — AB (ref 11.5–15.5)
WBC: 9.9 10*3/uL (ref 4.0–10.5)

## 2018-03-31 LAB — RAPID URINE DRUG SCREEN, HOSP PERFORMED
AMPHETAMINES: NOT DETECTED
BENZODIAZEPINES: NOT DETECTED
Cocaine: NOT DETECTED
Opiates: NOT DETECTED
TETRAHYDROCANNABINOL: NOT DETECTED

## 2018-03-31 LAB — COMPREHENSIVE METABOLIC PANEL
ALBUMIN: 3 g/dL — AB (ref 3.5–5.0)
ALT: 11 U/L — AB (ref 14–54)
AST: 14 U/L — AB (ref 15–41)
Alkaline Phosphatase: 63 U/L (ref 38–126)
Anion gap: 9 (ref 5–15)
BILIRUBIN TOTAL: 0.9 mg/dL (ref 0.3–1.2)
BUN: 29 mg/dL — AB (ref 6–20)
CHLORIDE: 104 mmol/L (ref 101–111)
CO2: 30 mmol/L (ref 22–32)
CREATININE: 1.67 mg/dL — AB (ref 0.44–1.00)
Calcium: 8.9 mg/dL (ref 8.9–10.3)
GFR calc Af Amer: 34 mL/min — ABNORMAL LOW (ref >60)
GFR, EST NON AFRICAN AMERICAN: 30 mL/min — AB (ref >60)
GLUCOSE: 126 mg/dL — AB (ref 65–99)
Potassium: 5.7 mmol/L — ABNORMAL HIGH (ref 3.5–5.1)
Sodium: 143 mmol/L (ref 135–145)
TOTAL PROTEIN: 8.5 g/dL — AB (ref 6.5–8.1)

## 2018-03-31 LAB — CBG MONITORING, ED: GLUCOSE-CAPILLARY: 139 mg/dL — AB (ref 65–99)

## 2018-03-31 LAB — POCT I-STAT 3, ART BLOOD GAS (G3+)
ACID-BASE EXCESS: 5 mmol/L — AB (ref 0.0–2.0)
Bicarbonate: 33.8 mmol/L — ABNORMAL HIGH (ref 20.0–28.0)
O2 SAT: 87 %
PH ART: 7.305 — AB (ref 7.350–7.450)
PO2 ART: 61 mmHg — AB (ref 83.0–108.0)
Patient temperature: 98.8
TCO2: 36 mmol/L — ABNORMAL HIGH (ref 22–32)
pCO2 arterial: 68.1 mmHg (ref 32.0–48.0)

## 2018-03-31 LAB — GLUCOSE, CAPILLARY
GLUCOSE-CAPILLARY: 130 mg/dL — AB (ref 65–99)
GLUCOSE-CAPILLARY: 153 mg/dL — AB (ref 65–99)

## 2018-03-31 LAB — BRAIN NATRIURETIC PEPTIDE: B NATRIURETIC PEPTIDE 5: 175.7 pg/mL — AB (ref 0.0–100.0)

## 2018-03-31 LAB — TROPONIN I: Troponin I: <0.03 ng/mL (ref <0.03)

## 2018-03-31 LAB — TRIGLYCERIDES: Triglycerides: 114 mg/dL (ref <150)

## 2018-03-31 MED ORDER — ORAL CARE MOUTH RINSE
15.0000 mL | OROMUCOSAL | Status: DC
  Administered 2018-03-31 – 2018-04-07 (×69): 15 mL via OROMUCOSAL

## 2018-03-31 MED ORDER — CHLORHEXIDINE GLUCONATE 0.12% ORAL RINSE (MEDLINE KIT)
15.0000 mL | Freq: Two times a day (BID) | OROMUCOSAL | Status: DC
  Administered 2018-03-31 – 2018-04-07 (×14): 15 mL via OROMUCOSAL

## 2018-03-31 MED ORDER — ROCURONIUM BROMIDE 50 MG/5ML IV SOLN
0.6000 mg/kg | Freq: Once | INTRAVENOUS | Status: DC
  Filled 2018-03-31: qty 7.57

## 2018-03-31 MED ORDER — FENTANYL 2500MCG IN NS 250ML (10MCG/ML) PREMIX INFUSION
25.0000 ug/h | INTRAVENOUS | Status: DC
  Administered 2018-03-31: 50 ug/h via INTRAVENOUS
  Administered 2018-04-01 – 2018-04-02 (×2): 100 ug/h via INTRAVENOUS
  Filled 2018-03-31 (×3): qty 250

## 2018-03-31 MED ORDER — FENTANYL CITRATE (PF) 100 MCG/2ML IJ SOLN
100.0000 ug | Freq: Once | INTRAMUSCULAR | Status: AC
  Administered 2018-03-31: 100 ug via INTRAVENOUS

## 2018-03-31 MED ORDER — FENTANYL CITRATE (PF) 100 MCG/2ML IJ SOLN: 50.0000 ug | Freq: Once | INTRAMUSCULAR | Status: DC

## 2018-03-31 MED ORDER — FENTANYL CITRATE (PF) 100 MCG/2ML IJ SOLN
INTRAMUSCULAR | Status: AC
  Filled 2018-03-31: qty 4

## 2018-03-31 MED ORDER — ASPIRIN 81 MG PO CHEW
81.0000 mg | CHEWABLE_TABLET | Freq: Every day | ORAL | Status: DC
  Administered 2018-04-01: 81 mg via ORAL
  Filled 2018-03-31: qty 1

## 2018-03-31 MED ORDER — PIPERACILLIN-TAZOBACTAM 3.375 G IVPB
3.3750 g | Freq: Three times a day (TID) | INTRAVENOUS | Status: DC
  Administered 2018-03-31 – 2018-04-01 (×3): 3.375 g via INTRAVENOUS
  Filled 2018-03-31 (×3): qty 50

## 2018-03-31 MED ORDER — MIDAZOLAM HCL 2 MG/2ML IJ SOLN
4.0000 mg | Freq: Once | INTRAMUSCULAR | Status: AC
  Administered 2018-03-31: 4 mg via INTRAVENOUS

## 2018-03-31 MED ORDER — POTASSIUM CHLORIDE CRYS ER 20 MEQ PO TBCR: 40.0000 meq | EXTENDED_RELEASE_TABLET | Freq: Two times a day (BID) | ORAL | Status: DC

## 2018-03-31 MED ORDER — MIDAZOLAM HCL 2 MG/2ML IJ SOLN
INTRAMUSCULAR | Status: AC
  Filled 2018-03-31: qty 2

## 2018-03-31 MED ORDER — MIDAZOLAM HCL 2 MG/2ML IJ SOLN
INTRAMUSCULAR | Status: AC
  Filled 2018-03-31: qty 4

## 2018-03-31 MED ORDER — INSULIN ASPART 100 UNIT/ML ~~LOC~~ SOLN
0.0000 [IU] | SUBCUTANEOUS | Status: DC
  Administered 2018-03-31 (×2): 3 [IU] via SUBCUTANEOUS
  Administered 2018-03-31 – 2018-04-01 (×3): 4 [IU] via SUBCUTANEOUS
  Administered 2018-04-01 – 2018-04-02 (×8): 3 [IU] via SUBCUTANEOUS
  Administered 2018-04-03: 4 [IU] via SUBCUTANEOUS
  Administered 2018-04-03: 3 [IU] via SUBCUTANEOUS
  Administered 2018-04-03: 4 [IU] via SUBCUTANEOUS
  Administered 2018-04-03: 3 [IU] via SUBCUTANEOUS
  Administered 2018-04-03 – 2018-04-04 (×3): 4 [IU] via SUBCUTANEOUS
  Administered 2018-04-04: 3 [IU] via SUBCUTANEOUS
  Administered 2018-04-04: 4 [IU] via SUBCUTANEOUS
  Administered 2018-04-04: 3 [IU] via SUBCUTANEOUS
  Administered 2018-04-04: 4 [IU] via SUBCUTANEOUS
  Administered 2018-04-05: 3 [IU] via SUBCUTANEOUS
  Administered 2018-04-05 – 2018-04-06 (×11): 4 [IU] via SUBCUTANEOUS
  Administered 2018-04-07 (×3): 3 [IU] via SUBCUTANEOUS
  Filled 2018-03-31: qty 1

## 2018-03-31 MED ORDER — PROPOFOL 1000 MG/100ML IV EMUL
0.0000 ug/kg/min | INTRAVENOUS | Status: DC
  Administered 2018-03-31: 5 ug/kg/min via INTRAVENOUS
  Administered 2018-04-01: 10 ug/kg/min via INTRAVENOUS
  Administered 2018-04-02: 5 ug/kg/min via INTRAVENOUS
  Administered 2018-04-02: 10 ug/kg/min via INTRAVENOUS
  Administered 2018-04-03: 5 ug/kg/min via INTRAVENOUS
  Filled 2018-03-31 (×6): qty 100

## 2018-03-31 MED ORDER — SODIUM CHLORIDE 0.9 % IV SOLN
250.0000 mL | INTRAVENOUS | Status: DC | PRN
  Administered 2018-04-03 – 2018-04-06 (×2): 250 mL via INTRAVENOUS

## 2018-03-31 MED ORDER — BUDESONIDE 0.5 MG/2ML IN SUSP
0.5000 mg | Freq: Two times a day (BID) | RESPIRATORY_TRACT | Status: DC
  Administered 2018-03-31 – 2018-04-12 (×24): 0.5 mg via RESPIRATORY_TRACT
  Filled 2018-03-31 (×25): qty 2

## 2018-03-31 MED ORDER — IPRATROPIUM-ALBUTEROL 0.5-2.5 (3) MG/3ML IN SOLN
3.0000 mL | Freq: Four times a day (QID) | RESPIRATORY_TRACT | Status: DC
  Administered 2018-03-31 – 2018-04-09 (×34): 3 mL via RESPIRATORY_TRACT
  Filled 2018-03-31 (×33): qty 3

## 2018-03-31 MED ORDER — FENTANYL CITRATE (PF) 100 MCG/2ML IJ SOLN
INTRAMUSCULAR | Status: AC
  Filled 2018-03-31: qty 2

## 2018-03-31 MED ORDER — ARFORMOTEROL TARTRATE 15 MCG/2ML IN NEBU
15.0000 ug | INHALATION_SOLUTION | Freq: Two times a day (BID) | RESPIRATORY_TRACT | Status: DC
  Administered 2018-03-31 – 2018-04-12 (×24): 15 ug via RESPIRATORY_TRACT
  Filled 2018-03-31 (×25): qty 2

## 2018-03-31 MED ORDER — FENTANYL BOLUS VIA INFUSION
50.0000 ug | INTRAVENOUS | Status: DC | PRN
  Administered 2018-04-01 – 2018-04-03 (×4): 50 ug via INTRAVENOUS
  Filled 2018-03-31: qty 50

## 2018-03-31 MED ORDER — METHYLPREDNISOLONE SODIUM SUCC 125 MG IJ SOLR
125.0000 mg | Freq: Once | INTRAMUSCULAR | Status: AC
  Administered 2018-03-31: 125 mg via INTRAVENOUS
  Filled 2018-03-31: qty 2

## 2018-03-31 MED ORDER — PIPERACILLIN-TAZOBACTAM 3.375 G IVPB 30 MIN
3.3750 g | Freq: Once | INTRAVENOUS | Status: AC
  Administered 2018-03-31: 3.375 g via INTRAVENOUS
  Filled 2018-03-31: qty 50

## 2018-03-31 MED ORDER — HEPARIN SODIUM (PORCINE) 5000 UNIT/ML IJ SOLN
5000.0000 [IU] | Freq: Three times a day (TID) | INTRAMUSCULAR | Status: DC
  Administered 2018-04-01 – 2018-04-12 (×34): 5000 [IU] via SUBCUTANEOUS
  Filled 2018-03-31 (×33): qty 1

## 2018-03-31 MED ORDER — FUROSEMIDE 10 MG/ML IJ SOLN
60.0000 mg | Freq: Once | INTRAMUSCULAR | Status: AC
  Administered 2018-03-31: 60 mg via INTRAVENOUS
  Filled 2018-03-31: qty 6

## 2018-03-31 MED ORDER — SODIUM CHLORIDE 0.9 % IV SOLN
1500.0000 mg | INTRAVENOUS | Status: DC
  Filled 2018-03-31: qty 1500

## 2018-03-31 MED ORDER — IPRATROPIUM-ALBUTEROL 0.5-2.5 (3) MG/3ML IN SOLN: 3.0000 mL | Freq: Four times a day (QID) | RESPIRATORY_TRACT | Status: DC

## 2018-03-31 MED ORDER — ROCURONIUM BROMIDE 50 MG/5ML IV SOLN
75.0000 mg | Freq: Once | INTRAVENOUS | Status: AC
  Administered 2018-03-31: 75 mg via INTRAVENOUS
  Filled 2018-03-31: qty 7.5

## 2018-03-31 MED ORDER — FUROSEMIDE 10 MG/ML IJ SOLN
60.0000 mg | Freq: Two times a day (BID) | INTRAMUSCULAR | Status: DC
  Administered 2018-03-31 – 2018-04-02 (×4): 60 mg via INTRAVENOUS
  Filled 2018-03-31 (×4): qty 6

## 2018-03-31 MED ORDER — FAMOTIDINE IN NACL 20-0.9 MG/50ML-% IV SOLN
20.0000 mg | Freq: Two times a day (BID) | INTRAVENOUS | Status: DC
  Administered 2018-03-31 – 2018-04-02 (×4): 20 mg via INTRAVENOUS
  Filled 2018-03-31 (×4): qty 50

## 2018-03-31 MED ORDER — NALOXONE HCL 0.4 MG/ML IJ SOLN
0.4000 mg | Freq: Once | INTRAMUSCULAR | Status: AC
  Administered 2018-03-31: 0.4 mg via INTRAVENOUS
  Filled 2018-03-31: qty 1

## 2018-03-31 MED ORDER — VANCOMYCIN HCL 10 G IV SOLR
2500.0000 mg | Freq: Once | INTRAVENOUS | Status: DC
  Filled 2018-03-31: qty 2500

## 2018-03-31 NOTE — Progress Notes (Signed)
Patient came to ICU, ABG showed some improvement with PCO2 95 but PH is still 7.18. Patient continue to remain very drowsy. As patient did not have much improvemetn with 2-3 hr BIPAP trial we decided to intubate patient. RSI done with fentanyl versed and rocuronium and patient was intubated. Patient had difficult access and central line placed by Montey Hora PA. Vent ordersed and sedation orderset placed Plan of care discussed with nursing at length Will inform night team  Lahoma Rocker Pulmonary Critical Care [facility] Sleep Medicine

## 2018-03-31 NOTE — Procedures (Signed)
   Endotracheal  Intubation Procedure Note  Performed By:  Lahoma Rocker, MD   Assistant:   RN, RT  Medications given were:   Vacuronicum 50 and Versed 4   Preoxygenated with BVM ventilation   Oral cavity was cleared;  Mac blade with camera was used to visualize the vocal cords;  cords moved symmetrically.  A number  7.5   cuffed ETT was used to intubate the trachea;  ETT secured at : 23 cm at the teeth.    Placement was evaluated by noting: bilateral, symmetric breath sounds,  good end-tidal CO2 detector color change to yellow ,  Equal breath sounds bilaterally over chest;   no breath sounds over stomach and  chest x-ray visualization.    Attempts required:  1  Complications: none.    The procedure was tolerated well  Estimated Blood Loss:                     cxr- post intubation: Tube in good position   Lahoma Rocker, MD INTENSIVIST-PULMONOLOGIST

## 2018-03-31 NOTE — Progress Notes (Signed)
Pharmacy Antibiotic Note  Heather Zimmerman is a 72 y.o. female admitted on 03/31/2018 with AMS and respiratory distress.  Pharmacy has been consulted for vancomycin and Zosyn dosing. CrCl ~35 ml/min, Wt >100kg.  Plan: Vancomycin 2500mg  IV x1 then 1500mg  IV q24h Zosyn 3.375g IV x1 over 30 min then EI q8h Monitor LOT, cultures, renal function Vancomycin level as indicated     Temp (24hrs), Avg:97 F (36.1 C), Min:97 F (36.1 C), Max:97 F (36.1 C)  Recent Labs  Lab 03/31/18 1305  WBC 9.9  CREATININE 1.67*    CrCl cannot be calculated (Unknown ideal weight.).    Allergies  Allergen Reactions  . Ace Inhibitors Other (See Comments)    unknown    Antimicrobials this admission: Vancomycin 6/18 >>  Zosyn 6/18 >>   Dose adjustments this admission: none  Microbiology results: ordered  Thank you for allowing pharmacy to be a part of this patient's care.  Arrie Senate, PharmD, BCPS PGY-2 Cardiology Pharmacy Resident Clinical Phone: [phone] 03/31/2018

## 2018-03-31 NOTE — ED Triage Notes (Cosign Needed)
Pt here from [facility] with c/o aloc and lows sats , pt was bag with ems and arrived with a NRB pt will respond to painful stimuli
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
## 2018-03-31 NOTE — Procedures (Signed)
Central Venous Catheter Insertion Procedure Note Heather Zimmerman. Weesner [id number] 11/26/1945  Procedure: Insertion of Central Venous Catheter Indications: Assessment of intravascular volume, Drug and/or fluid administration and Frequent blood sampling  Procedure Details Consent: Unable to obtain consent because of emergent medical necessity. Time Out: Verified patient identification, verified procedure, site/side was marked, verified correct patient position, special equipment/implants available, medications/allergies/relevent history reviewed, required imaging and test results available.  Performed  Maximum sterile technique was used including antiseptics, cap, gloves, gown, hand hygiene, mask and sheet. Skin prep: Chlorhexidine; local anesthetic administered A antimicrobial bonded/coated triple lumen catheter was placed in the left internal jugular vein using the Seldinger technique.  Evaluation Blood flow good Complications: No apparent complications Patient did tolerate procedure well. Chest X-ray ordered to verify placement.  CXR: pending.  Procedure performed under direct ultrasound guidance for real time vessel cannulation.      Montey Hora, [facility] Pager: [phone]  or [phone] 03/31/2018, 6:55 PM
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
## 2018-03-31 NOTE — Progress Notes (Signed)
Patient transported to 2M14 from the ED without any apparent complications. Receiving RT has been given report.

## 2018-04-01 ENCOUNTER — Inpatient Hospital Stay (HOSPITAL_COMMUNITY): Payer: Medicare Other

## 2018-04-01 DIAGNOSIS — I517 Cardiomegaly: Secondary | ICD-10-CM

## 2018-04-01 DIAGNOSIS — J810 Acute pulmonary edema: Secondary | ICD-10-CM

## 2018-04-01 LAB — GLUCOSE, CAPILLARY
GLUCOSE-CAPILLARY: 141 mg/dL — AB (ref 65–99)
Glucose-Capillary: 125 mg/dL — ABNORMAL HIGH (ref 65–99)
Glucose-Capillary: 131 mg/dL — ABNORMAL HIGH (ref 65–99)
Glucose-Capillary: 139 mg/dL — ABNORMAL HIGH (ref 65–99)
Glucose-Capillary: 152 mg/dL — ABNORMAL HIGH (ref 65–99)
Glucose-Capillary: 160 mg/dL — ABNORMAL HIGH (ref 65–99)

## 2018-04-01 LAB — BLOOD CULTURE ID PANEL (REFLEXED)
Acinetobacter baumannii: NOT DETECTED
CANDIDA PARAPSILOSIS: NOT DETECTED
Candida albicans: NOT DETECTED
Candida glabrata: NOT DETECTED
Candida krusei: NOT DETECTED
Candida tropicalis: NOT DETECTED
ENTEROBACTERIACEAE SPECIES: NOT DETECTED
Enterobacter cloacae complex: NOT DETECTED
Enterococcus species: NOT DETECTED
Escherichia coli: NOT DETECTED
HAEMOPHILUS INFLUENZAE: NOT DETECTED
KLEBSIELLA PNEUMONIAE: NOT DETECTED
Klebsiella oxytoca: NOT DETECTED
Listeria monocytogenes: NOT DETECTED
METHICILLIN RESISTANCE: NOT DETECTED
NEISSERIA MENINGITIDIS: NOT DETECTED
Proteus species: NOT DETECTED
Pseudomonas aeruginosa: NOT DETECTED
SERRATIA MARCESCENS: NOT DETECTED
STAPHYLOCOCCUS AUREUS BCID: NOT DETECTED
STREPTOCOCCUS SPECIES: NOT DETECTED
Staphylococcus species: DETECTED — AB
Streptococcus agalactiae: NOT DETECTED
Streptococcus pneumoniae: NOT DETECTED
Streptococcus pyogenes: NOT DETECTED

## 2018-04-01 LAB — BASIC METABOLIC PANEL
ANION GAP: 5 (ref 5–15)
BUN: 37 mg/dL — ABNORMAL HIGH (ref 6–20)
CHLORIDE: 104 mmol/L (ref 101–111)
CO2: 33 mmol/L — AB (ref 22–32)
Calcium: 8.4 mg/dL — ABNORMAL LOW (ref 8.9–10.3)
Creatinine, Ser: 1.91 mg/dL — ABNORMAL HIGH (ref 0.44–1.00)
GFR calc Af Amer: 29 mL/min — ABNORMAL LOW (ref >60)
GFR, EST NON AFRICAN AMERICAN: 25 mL/min — AB (ref >60)
GLUCOSE: 159 mg/dL — AB (ref 65–99)
POTASSIUM: 5.2 mmol/L — AB (ref 3.5–5.1)
Sodium: 142 mmol/L (ref 135–145)

## 2018-04-01 LAB — ECHOCARDIOGRAM COMPLETE
Height: 61 in
Weight: 4402.15 oz

## 2018-04-01 LAB — CBC
HEMATOCRIT: 37.5 % (ref 36.0–46.0)
HEMOGLOBIN: 10.6 g/dL — AB (ref 12.0–15.0)
MCH: 25.4 pg — ABNORMAL LOW (ref 26.0–34.0)
MCHC: 28.3 g/dL — ABNORMAL LOW (ref 30.0–36.0)
MCV: 89.7 fL (ref 78.0–100.0)
PLATELETS: 198 10*3/uL (ref 150–400)
RBC: 4.18 MIL/uL (ref 3.87–5.11)
RDW: 20 % — ABNORMAL HIGH (ref 11.5–15.5)
WBC: 8.7 10*3/uL (ref 4.0–10.5)

## 2018-04-01 LAB — PHOSPHORUS
PHOSPHORUS: 2.6 mg/dL (ref 2.5–4.6)
Phosphorus: 2.9 mg/dL (ref 2.5–4.6)
Phosphorus: 2.6 mg/dL (ref 2.5–4.6)

## 2018-04-01 LAB — MAGNESIUM
Magnesium: 2 mg/dL (ref 1.7–2.4)
Magnesium: 2 mg/dL (ref 1.7–2.4)
Magnesium: 2 mg/dL (ref 1.7–2.4)

## 2018-04-01 LAB — PROCALCITONIN: Procalcitonin: 0.19 ng/mL

## 2018-04-01 LAB — MRSA PCR SCREENING: MRSA BY PCR: POSITIVE — AB

## 2018-04-01 MED ORDER — CEFAZOLIN SODIUM-DEXTROSE 2-4 GM/100ML-% IV SOLN
2.0000 g | Freq: Three times a day (TID) | INTRAVENOUS | Status: DC
  Administered 2018-04-01 – 2018-04-02 (×3): 2 g via INTRAVENOUS
  Filled 2018-04-01 (×4): qty 100

## 2018-04-01 MED ORDER — ASPIRIN 81 MG PO CHEW
81.0000 mg | CHEWABLE_TABLET | Freq: Every day | ORAL | Status: DC
  Administered 2018-04-02 – 2018-04-06 (×5): 81 mg
  Filled 2018-04-01 (×5): qty 1

## 2018-04-01 MED ORDER — SODIUM POLYSTYRENE SULFONATE 15 GM/60ML PO SUSP
30.0000 g | Freq: Once | ORAL | Status: AC
Start: 2018-04-01 — End: 2018-04-01
  Administered 2018-04-01: 30 g
  Filled 2018-04-01: qty 120

## 2018-04-01 MED ORDER — CHLORHEXIDINE GLUCONATE CLOTH 2 % EX PADS
6.0000 | MEDICATED_PAD | Freq: Every day | CUTANEOUS | Status: AC
  Administered 2018-04-01 – 2018-04-04 (×2): 6 via TOPICAL

## 2018-04-01 MED ORDER — PRO-STAT SUGAR FREE PO LIQD
30.0000 mL | Freq: Two times a day (BID) | ORAL | Status: DC
  Administered 2018-04-01 – 2018-04-06 (×11): 30 mL
  Filled 2018-04-01 (×11): qty 30

## 2018-04-01 MED ORDER — MUPIROCIN 2 % EX OINT
1.0000 "application " | TOPICAL_OINTMENT | Freq: Two times a day (BID) | CUTANEOUS | Status: AC
  Administered 2018-04-01 – 2018-04-05 (×10): 1 via NASAL
  Filled 2018-04-01 (×2): qty 22

## 2018-04-01 MED ORDER — VITAL HIGH PROTEIN PO LIQD
1000.0000 mL | Freq: Every day | ORAL | Status: DC
  Administered 2018-04-01 – 2018-04-02 (×2): 1000 mL
  Administered 2018-04-02: 14:00:00
  Administered 2018-04-03 – 2018-04-06 (×5): 1000 mL

## 2018-04-01 MED ORDER — PERFLUTREN LIPID MICROSPHERE
1.0000 mL | INTRAVENOUS | Status: AC | PRN
  Administered 2018-04-01: 2 mL via INTRAVENOUS
  Filled 2018-04-01: qty 10

## 2018-04-01 NOTE — Progress Notes (Signed)
Patient transported on vent to CT and back to 3J-25 without complication.

## 2018-04-01 NOTE — Progress Notes (Signed)
Initial Nutrition Assessment  DOCUMENTATION CODES:   Morbid obesity  INTERVENTION:    Vital High Protein at 40 ml/h (960 ml per day)  Pro-stat 30 ml BID  Provides 1160 kcal, 114 gm protein, 803 ml free water daily  NUTRITION DIAGNOSIS:   Inadequate oral intake related to inability to eat as evidenced by NPO status.  GOAL:   Provide needs based on ASPEN/SCCM guidelines  MONITOR:   Vent status, TF tolerance, Skin, Labs, I & O's  REASON FOR ASSESSMENT:   Ventilator, Consult Enteral/tube feeding initiation and management  ASSESSMENT:   72 yo female with PMH of DM2, HTN, CHF, GERD, vitamin D deficiency, venous stasis ulcers, R BKA, OSA, CKD who was admitted on 6/18 with SOB and respiratory distress requiring intubation.  Discussed patient in ICU rounds and with RN today. Received MD Consult for TF initiation and management. Patient is currently intubated on ventilator support Temp (24hrs), Avg:99.7 F (37.6 C), Min:98.8 F (37.1 C), Max:100.2 F (37.9 C)  Propofol: 3.8 ml/hr providing 100 kcal from lipid Labs reviewed. Potassium 5.2 (H) CBG's: 160-141 Medications reviewed and include lasix, propofol, novolog.   NUTRITION - FOCUSED PHYSICAL EXAM:    Most Recent Value  Orbital Region  No depletion  Upper Arm Region  No depletion  Thoracic and Lumbar Region  Unable to assess  Buccal Region  No depletion  Temple Region  No depletion  Clavicle Bone Region  No depletion  Clavicle and Acromion Bone Region  No depletion  Scapular Bone Region  Unable to assess  Dorsal Hand  No depletion  Patellar Region  Unable to assess  Anterior Thigh Region  No depletion  Posterior Calf Region  Unable to assess  Edema (RD Assessment)  Unable to assess  Hair  Reviewed  Eyes  Unable to assess  Mouth  Unable to assess  Skin  Reviewed  Nails  Reviewed       Diet Order:   Diet Order           Diet NPO time specified  Diet effective now          EDUCATION NEEDS:    No education needs have been identified at this time  Skin:  Skin Assessment: Reviewed RN Assessment  Last BM:  unknown  Height:   Ht Readings from Last 1 Encounters:  03/31/18 5\' 1"  (1.549 m)    Weight:   Wt Readings from Last 1 Encounters:  04/01/18 275 lb 2.2 oz (124.8 kg)    Ideal Body Weight:  43.9 kg  BMI:  Body mass index is 51.99 kg/m.  Estimated Nutritional Needs:   Kcal:  1000-1100  Protein:  110 gm  Fluid:  1.5 L    Molli Barrows, RD, LDN, CNSC Pager [phone] After Hours Pager [phone]

## 2018-04-01 NOTE — Progress Notes (Signed)
PULMONARY / CRITICAL CARE MEDICINE   Name: Heather Zimmerman. Mascaro MRN: [id number] DOB: [birth year]    ADMISSION DATE:  03/31/2018 CONSULTATION DATE:  03/31/18  REFERRING MD:  Dr. Vanita Panda  CHIEF COMPLAINT:  AMS, hypoxia   HISTORY OF PRESENT ILLNESS: Heather Zimmerman. Heather Zimmerman is a 72 y.o. F with hx severe OSA, dCHF,  CKD III, HTN presented [facility] on 6/18 with SOB and respiratory distress.  She was reportedly seen early am and in usual state of health.  She apparently went to speech therapy and after completion, she was noted to be dyspneic, non-verbal and altered.  She was put on a NRB for hypoxia.  Sats were in the 60's. The patient was given narcan without significant response.  She was transported to ED where she remained altered and hypoxic.  She was subsequently placed on BiPAP support 12/8. CXR showed cardiomegaly and concern for pulmonary edema.  ABG 7.174 / 102 / 98 / 36.    Due to needs for BiPAP and concern for deterioration, PCCM was consulted for evaluation.  Of note, she has had multiple UTI's (E.coli and enterococcus) in the past with multi drug resistance.   SUBJECTIVE:   Intubated late yesterday evening.   Increasing FiO2 needs this am - now on 60%.  Hemodynamically stable   VITAL SIGNS: BP (!) 90/54   Pulse 93   Temp 100.2 F (37.9 C) (Oral)   Resp 16   Ht 5\' 1"  (1.549 m)   Wt 124.8 kg (275 lb 2.2 oz)   LMP  (LMP Unknown)   SpO2 92%   BMI 51.99 kg/m   HEMODYNAMICS:    VENTILATOR SETTINGS: Vent Mode: PRVC FiO2 (%):  [40 %-60 %] 60 % Set Rate:  [12 bmp-20 bmp] 20 bmp Vt Set:  [380 mL] 380 mL PEEP:  [8 cmH20] 8 cmH20 Plateau Pressure:  [21 cmH20-25 cmH20] 21 cmH20  INTAKE / OUTPUT: I/O last 3 completed shifts: In: 721.9 [I.V.:126.6; IV Piggyback:595.3] Out: 725 [Urine:725]  PHYSICAL EXAMINATION: General:  Morbidly obese female, NAD on vent  Neuro:  Awake, nods appropriately, RASS 0 on propofol, fent   HEENT:  ETT, mm moist  Cardiovascular:  RRR, no M/R/G Lungs:   resps even non labored on vent, diminished, few scattered crackles  Abdomen:  Obese, BS x 4, S/NT/ND Musculoskeletal:  No acute deformities, R BKA Skin: Warm/dry, LLE venous stasis changes with dressing in place  LABS:  BMET Recent Labs  Lab 03/31/18 1305 03/31/18 1908 04/01/18 0535  NA 143 142 142  K 5.7* 6.0* 5.2*  CL 104 102 104  CO2 30 33* 33*  BUN 29* 32* 37*  CREATININE 1.67* 1.63* 1.91*  GLUCOSE 126* 150* 159*    Electrolytes Recent Labs  Lab 03/31/18 1305 03/31/18 1908 04/01/18 0535  CALCIUM 8.9 8.7* 8.4*  MG  --   --  2.0  PHOS  --   --  2.9    CBC Recent Labs  Lab 03/31/18 1305 04/01/18 0535  WBC 9.9 8.7  HGB 11.8* 10.6*  HCT 43.6 37.5  PLT 229 198    Coag's No results for input(s): APTT, INR in the last 168 hours.  Sepsis Markers No results for input(s): LATICACIDVEN, PROCALCITON, O2SATVEN in the last 168 hours.  ABG Recent Labs  Lab 03/31/18 1525 03/31/18 1738 03/31/18 2109  PHART 7.174* 7.188* 7.305*  PCO2ART 102* 95.8* 68.1*  PO2ART 98.6 74.0* 61.0*    Liver Enzymes Recent Labs  Lab 03/31/18 1305  AST 14*  ALT  11*  ALKPHOS 63  BILITOT 0.9  ALBUMIN 3.0*    Cardiac Enzymes Recent Labs  Lab 03/31/18 1305  TROPONINI <0.03    Glucose Recent Labs  Lab 03/31/18 1644 03/31/18 2059 03/31/18 2341 04/01/18 0417 04/01/18 0718  GLUCAP 139* 130* 153* 131* 160*    Imaging Ct Head Wo Contrast  Result Date: 04/01/2018 CLINICAL DATA:  72 y/o F; patient admitted with altered mental status, speech alteration, and respiratory distress. EXAM: CT HEAD WITHOUT CONTRAST TECHNIQUE: Contiguous axial images were obtained from the base of the skull through the vertex without intravenous contrast. COMPARISON:  06/22/2017 CT head. FINDINGS: Brain: No evidence of acute infarction, hemorrhage, hydrocephalus, extra-axial collection or mass lesion/mass effect. Stable mild chronic microvascular ischemic changes and parenchymal volume loss of the  brain. Vascular: Calcific atherosclerosis of the carotid siphons. Skull: Normal. Negative for fracture or focal lesion. Sinuses/Orbits: Chronic right floor of orbit fracture and opacification of the right maxillary sinus. Nasoenteric tube. Other: None. IMPRESSION: 1. No acute intracranial abnormality identified. 2. Stable mild chronic microvascular ischemic changes and parenchymal volume loss of the brain for age. 3. Chronic right floor of orbit fracture and opacification of the right maxillary sinus. Electronically Signed   By: Kristine Garbe M.D.   On: 04/01/2018 01:35   Dg Chest Port 1 View  Result Date: 04/01/2018 CLINICAL DATA:  Hypoxia EXAM: PORTABLE CHEST 1 VIEW COMPARISON:  March 31, 2018 FINDINGS: Endotracheal tube tip is 1.0 cm above the carina. Central catheter tip is in the left innominate vein near the junction with the superior vena cava. Nasogastric tube tip and side port are below the diaphragm with side port seen in the stomach. No pneumothorax. There is cardiomegaly with pulmonary vascularity normal. There is patchy airspace consolidation in both lower lobes with small pleural effusions bilaterally. No evident adenopathy. There is aortic atherosclerosis. There is degenerative change in each shoulder. IMPRESSION: Tube and catheter positions as described without pneumothorax. Note that the endotracheal tube tip is near the carina. It may be prudent to consider withdrawing endotracheal tube approximately 3 cm. Cardiomegaly with lower lobe consolidation and small pleural effusions. Suspect bibasilar pneumonia, although alveolar edema may be present in the lung bases as well. There is aortic atherosclerosis. Aortic Atherosclerosis (ICD10-I70.0). Electronically Signed   By: Lowella Grip III M.D.   On: 04/01/2018 07:30   Portable Chest X-ray  Result Date: 03/31/2018 CLINICAL DATA:  72 year old female post endotracheal tube placement. Subsequent encounter. EXAM: PORTABLE CHEST 1 VIEW  COMPARISON:  03/31/2018 1:02 p.m. FINDINGS: Endotracheal tube tip 1.8 cm above the carina. Left central line tip proximal superior vena cava level. Nasogastric tube courses below the diaphragm. Tip is not included on the present exam. No pneumothorax. Cardiomegaly. Persistent slightly asymmetric airspace disease suggestive of pulmonary edema. Calcified aorta. IMPRESSION: Endotracheal tube tip 1.8 cm above the carina. Left central line tip proximal superior vena cava level. No pneumothorax. Cardiomegaly. Persistent slightly asymmetric airspace disease suggestive of pulmonary edema. Aortic Atherosclerosis (ICD10-I70.0). Electronically Signed   By: Genia Del M.D.   On: 03/31/2018 19:46   Dg Chest Portable 1 View  Result Date: 03/31/2018 CLINICAL DATA:  Respiratory distress. History of chronic CHF, diabetes, chronic renal insufficiency, morbid obesity. EXAM: PORTABLE CHEST 1 VIEW COMPARISON:  Portable chest x-ray of May 28, 2017 FINDINGS: The lungs are well-expanded. The pulmonary vascularity is engorged and the interstitial markings are increased. There is a small left pleural effusion. The cardiac silhouette is enlarged. The trachea is deviated slightly to  the right chronically. There is calcification in the wall of the aortic arch. IMPRESSION: CHF with pulmonary interstitial and early alveolar edema and small left pleural effusion. Thoracic aortic atherosclerosis. Electronically Signed   By: David  Martinique M.D.   On: 03/31/2018 13:14   Dg Abd Portable 1v  Result Date: 03/31/2018 CLINICAL DATA:  72 year old female with nasogastric tube placement. Initial encounter. EXAM: PORTABLE ABDOMEN - 1 VIEW COMPARISON:  Chest x-ray same date.  CT abdomen pelvis 05/29/2017. FINDINGS: Nasogastric tube tip gastric antrum level with side hole fundus-body junction level. Limited evaluation of bowel gas pattern without gross abnormality. The possibility of free intraperitoneal air cannot be assessed on a supine view. IUD  in place. IMPRESSION: Nasogastric tube tip gastric antrum level with side hole fundus-body junction level. Electronically Signed   By: Genia Del M.D.   On: 03/31/2018 19:48     STUDIES:  CXR 6/18 > mild interstitial edema.  CULTURES: Blood 6/18 >  Sputum 6/18 >   ANTIBIOTICS: Vanc 6/18 >  Zosyn 6/18 >   SIGNIFICANT EVENTS: 6/18  Admit with AMS, hypercarbia   LINES/TUBES: None.  DISCUSSION: 72 y.o. F from SNF admitted 6/18 with AoC hypercapnic respiratory failure, failed bipap requiring intubation 6/18.  Also concern for sepsis.   ASSESSMENT / PLAN:  PULMONARY A: Acute on Chronic Hypercarbic Respiratory Failure  Severe OSA / probable OHS Pulmonary edema  ?HCAP P:   Vent support - 8cc/kg  F/u CXR  F/u ABG  Wean FiO2 as able to keep sats >90%  Continue BDs  Hold home advair  IV solumedrol  Will need qhs CPAP post extubation   CARDIOVASCULAR A:  Acute on chronic dCHF  Hx HTN P:  Continue lasix BID for now  Strict I/o  Hold home norvasc, PO furosemide  RENAL A:   CKD III  Mild hyperkalemia P:   F/u chem  Kayexalate  Lasix as above    GASTROINTESTINAL A:   Super Morbid Obesity  P:   Will start TF per nutrition  pepcid   HEMATOLOGIC A:   VTE prophylaxis Anemia - mild, no evidence of bleeding  P:  SCD's / heparin Trend CBC   INFECTIOUS A:   Concern for sepsis - unclear etiology.  Hx E.Coli and enterococcus UTI (with MDR), MRSA carrier ?HCAP P:   Continue empiric vanc/zosyn for now  Trend pct  Follow cultures    ENDOCRINE A:   Hx DM   P:   SSI Hold home glucophage, novolog  NEUROLOGIC A:   Acute Metabolic Encephalopathy - due to hypercapnia +/- sepsis - improved  Sedation needs on vent  P:   RASS goal -1  Propofol, fentanyl gtt  Hold home neurontin, ultram, cymbalta     FAMILY  - Updates: No family available 6/19.  - Inter-disciplinary family meet or Palliative Care meeting due by:  04/07/18.   [name],  NP 04/01/2018  10:55 AM Pager: [phone] or [phone]

## 2018-04-01 NOTE — Progress Notes (Signed)
  Echocardiogram 2D Echocardiogram has been performed.  Jennette Dubin 04/01/2018, 3:54 PM

## 2018-04-01 NOTE — Progress Notes (Signed)
PHARMACY - PHYSICIAN COMMUNICATION CRITICAL VALUE ALERT - BLOOD CULTURE IDENTIFICATION (BCID)  Heather Zimmerman is an 72 y.o. female who presented to [facility] on 03/31/2018 with a chief complaint of AMS and hypoxia  Assessment:  Patient has history significant for CHF with concern for CHF exacerbation versus sepsis unclear etiology. Blood cultures are 3 out of 4 positive for GPC clusters. BCID result is methicillin susceptible coagulase negative staphylococcus.   Name of physician (or Provider) Contacted: Dr. Lake Bells  Current antibiotics: Vancomycin and Zosyn  Changes to prescribed antibiotics recommended:  Recommendations accepted by provider - Discussed narrowing to Cefazolin versus staying broad if ruling out other sources. Dr. Lake Bells is comfortable with narrowing to Cefazolin at this time based on patient's clinical presentation.  Discontinue Vancomycin and Zosyn.  Start Cefazolin 2g IV every 8 hours.  Monitor renal function, culture results, and clinical status.     Results for orders placed or performed during the hospital encounter of 03/31/18  Blood Culture ID Panel (Reflexed) (Collected: 03/31/2018  4:42 PM)  Result Value Ref Range   Enterococcus species NOT DETECTED NOT DETECTED   Listeria monocytogenes NOT DETECTED NOT DETECTED   Staphylococcus species DETECTED (A) NOT DETECTED   Staphylococcus aureus NOT DETECTED NOT DETECTED   Methicillin resistance NOT DETECTED NOT DETECTED   Streptococcus species NOT DETECTED NOT DETECTED   Streptococcus agalactiae NOT DETECTED NOT DETECTED   Streptococcus pneumoniae NOT DETECTED NOT DETECTED   Streptococcus pyogenes NOT DETECTED NOT DETECTED   Acinetobacter baumannii NOT DETECTED NOT DETECTED   Enterobacteriaceae species NOT DETECTED NOT DETECTED   Enterobacter cloacae complex NOT DETECTED NOT DETECTED   Escherichia coli NOT DETECTED NOT DETECTED   Klebsiella oxytoca NOT DETECTED NOT DETECTED   Klebsiella pneumoniae NOT DETECTED  NOT DETECTED   Proteus species NOT DETECTED NOT DETECTED   Serratia marcescens NOT DETECTED NOT DETECTED   Haemophilus influenzae NOT DETECTED NOT DETECTED   Neisseria meningitidis NOT DETECTED NOT DETECTED   Pseudomonas aeruginosa NOT DETECTED NOT DETECTED   Candida albicans NOT DETECTED NOT DETECTED   Candida glabrata NOT DETECTED NOT DETECTED   Candida krusei NOT DETECTED NOT DETECTED   Candida parapsilosis NOT DETECTED NOT DETECTED   Candida tropicalis NOT DETECTED NOT DETECTED    Heather Zimmerman, PharmD, BCPS, BCCCP Clinical Pharmacist Clinical phone 04/01/2018 until 3:30PM - #[phone] After hours, please call #[phone] 04/01/2018  3:33 PM

## 2018-04-01 NOTE — Consult Note (Signed)
[facility] Nurse wound consult note Reason for Consult: Lower extremity Wound type: mixed etiology venous and arterial ulcerations. Appears to be chronic in nature.  Has large areas that cover most of the calf/pretibial region of pink re-epithelized skin. She has some weeping areas that are lateral calf and one small area distal pretibial. She is sedated on the ventilator and there is no family in the room.  Pressure Injury POA:NA Measurement: Lateral calf; aprox. 8cm x 4cm x 0.1cm  Pretibial LLE: aprox. 3cm x 2cm x 0.1cm  Wound PRX:YVOP, non granular, weeping partial thickness skin loss Drainage (amount, consistency, odor) yellow/greenish, with some odor Periwound: see above, venous dermatitis.  Dressing procedure/placement/frequency: Cleanse open wounds of the LLE with saline, pat dry. Apply silver hydrofiber to the open weeping areas. Wrap with kerlix. Change M/W/F.  Ok to use SCD's over dressings.  Will need follow up with wound care center or wound care provider at [facility].   Discussed POC with bedside nurse.  Re consult if needed, will not follow at this time. Thanks  Eion Timbrook [employer], RN,CWOCN, CNS, [place] [phone])

## 2018-04-02 ENCOUNTER — Inpatient Hospital Stay (HOSPITAL_COMMUNITY): Payer: Medicare Other

## 2018-04-02 DIAGNOSIS — J189 Pneumonia, unspecified organism: Secondary | ICD-10-CM

## 2018-04-02 LAB — CBC
HCT: 32.7 % — ABNORMAL LOW (ref 36.0–46.0)
Hemoglobin: 9.4 g/dL — ABNORMAL LOW (ref 12.0–15.0)
MCH: 25.7 pg — ABNORMAL LOW (ref 26.0–34.0)
MCHC: 28.7 g/dL — ABNORMAL LOW (ref 30.0–36.0)
MCV: 89.3 fL (ref 78.0–100.0)
PLATELETS: 199 10*3/uL (ref 150–400)
RBC: 3.66 MIL/uL — AB (ref 3.87–5.11)
RDW: 20.7 % — ABNORMAL HIGH (ref 11.5–15.5)
WBC: 6.8 10*3/uL (ref 4.0–10.5)

## 2018-04-02 LAB — GLUCOSE, CAPILLARY
GLUCOSE-CAPILLARY: 112 mg/dL — AB (ref 65–99)
GLUCOSE-CAPILLARY: 132 mg/dL — AB (ref 65–99)
GLUCOSE-CAPILLARY: 137 mg/dL — AB (ref 65–99)
GLUCOSE-CAPILLARY: 159 mg/dL — AB (ref 65–99)
Glucose-Capillary: 129 mg/dL — ABNORMAL HIGH (ref 65–99)
Glucose-Capillary: 144 mg/dL — ABNORMAL HIGH (ref 65–99)

## 2018-04-02 LAB — MAGNESIUM
MAGNESIUM: 2 mg/dL (ref 1.7–2.4)
MAGNESIUM: 2 mg/dL (ref 1.7–2.4)

## 2018-04-02 LAB — BASIC METABOLIC PANEL
ANION GAP: 8 (ref 5–15)
BUN: 47 mg/dL — ABNORMAL HIGH (ref 6–20)
CO2: 34 mmol/L — ABNORMAL HIGH (ref 22–32)
Calcium: 8.3 mg/dL — ABNORMAL LOW (ref 8.9–10.3)
Chloride: 104 mmol/L (ref 101–111)
Creatinine, Ser: 2.56 mg/dL — ABNORMAL HIGH (ref 0.44–1.00)
GFR calc Af Amer: 20 mL/min — ABNORMAL LOW (ref >60)
GFR, EST NON AFRICAN AMERICAN: 17 mL/min — AB (ref >60)
Glucose, Bld: 141 mg/dL — ABNORMAL HIGH (ref 65–99)
POTASSIUM: 4.2 mmol/L (ref 3.5–5.1)
Sodium: 146 mmol/L — ABNORMAL HIGH (ref 135–145)

## 2018-04-02 LAB — PHOSPHORUS
Phosphorus: 3 mg/dL (ref 2.5–4.6)
Phosphorus: 3.8 mg/dL (ref 2.5–4.6)

## 2018-04-02 LAB — BRAIN NATRIURETIC PEPTIDE: B Natriuretic Peptide: 18.3 pg/mL (ref 0.0–100.0)

## 2018-04-02 LAB — PROCALCITONIN: PROCALCITONIN: 0.18 ng/mL

## 2018-04-02 MED ORDER — FAMOTIDINE IN NACL 20-0.9 MG/50ML-% IV SOLN
20.0000 mg | INTRAVENOUS | Status: DC
  Administered 2018-04-03 – 2018-04-06 (×4): 20 mg via INTRAVENOUS
  Filled 2018-04-02 (×4): qty 50

## 2018-04-02 MED ORDER — FUROSEMIDE 10 MG/ML IJ SOLN
60.0000 mg | Freq: Four times a day (QID) | INTRAMUSCULAR | Status: DC
  Administered 2018-04-02 – 2018-04-04 (×8): 60 mg via INTRAVENOUS
  Filled 2018-04-02 (×8): qty 6

## 2018-04-02 MED ORDER — CEFAZOLIN SODIUM-DEXTROSE 2-4 GM/100ML-% IV SOLN
2.0000 g | Freq: Two times a day (BID) | INTRAVENOUS | Status: DC
  Administered 2018-04-02 – 2018-04-04 (×4): 2 g via INTRAVENOUS
  Filled 2018-04-02 (×4): qty 100

## 2018-04-02 NOTE — Progress Notes (Signed)
Patient's peak pressures were 40 on the vent after suctioning, RT called and patient was bagged for 2 minutes. Peak pressures are 35 now and patient is comfortable and resting. Will continue to monitor. Modena Morrow E, RN 04/02/2018 1:35 PM

## 2018-04-02 NOTE — Progress Notes (Signed)
Pharmacy Antibiotic Note  Heather Zimmerman is a 72 y.o. female admitted on 03/31/2018 with bacteremia.  Pharmacy has been consulted for cefazolin dosing. Currently growing 3/4 coag negative staph. On D#2 of abx. Renal fx has continued to decline today. SCr up to 2.56. CrCl ~ 20-25 mL/min. WBC remains wnl. PCT 0.18.   Plan: -Decrease cefazolin to 2 gm IV Q 12 hours -Monitor cultures and renal fx   Height: 5\' 1"  (154.9 cm) Weight: 272 lb 0.8 oz (123.4 kg) IBW/kg (Calculated) : 47.8  Temp (24hrs), Avg:100.1 F (37.8 C), Min:99.7 F (37.6 C), Max:100.4 F (38 C)  Recent Labs  Lab 03/31/18 1305 03/31/18 1908 04/01/18 0535 04/02/18 0629  WBC 9.9  --  8.7 6.8  CREATININE 1.67* 1.63* 1.91* 2.56*    Estimated Creatinine Clearance: 24.1 mL/min (A) (by C-G formula based on SCr of 2.56 mg/dL (H)).    Allergies  Allergen Reactions  . Ace Inhibitors Other (See Comments)    unknown    6/18 Zosyn >> 6/19 6/18 Vanco >>6/19 6/19 Cefazolin >>   6/18 MRSA PCR pos 6/18 BCx2>> 3/4 CONS   Thank you for allowing pharmacy to be a part of this patient's care.  Albertina Parr, PharmD., BCPS Clinical Pharmacist Clinical phone for 04/02/18 until 3:30pm: [phone] If after 3:30pm, please call main pharmacy at: [phone]

## 2018-04-02 NOTE — Progress Notes (Signed)
PULMONARY / CRITICAL CARE MEDICINE   Name: Heather Zimmerman MRN: [id number] DOB: 1946/10/01    ADMISSION DATE:  03/31/2018 CONSULTATION DATE:  03/31/18  REFERRING MD:  Dr. Vanita Panda  CHIEF COMPLAINT:  AMS, hypoxia   HISTORY OF PRESENT ILLNESS: Heather Zimmerman is a 72 y.o. F with hx severe OSA, dCHF,  CKD III, HTN presented [facility] on 6/18 with SOB and respiratory distress.  She was reportedly seen early am and in usual state of health.  She apparently went to speech therapy and after completion, she was noted to be dyspneic, non-verbal and altered.  She was put on a NRB for hypoxia.  Sats were in the 60's. The patient was given narcan without significant response.  She was transported to ED where she remained altered and hypoxic.  She was subsequently placed on BiPAP support 12/8. CXR showed cardiomegaly and concern for pulmonary edema.  ABG 7.174 / 102 / 98 / 36.    Due to needs for BiPAP and concern for deterioration, PCCM was consulted for evaluation.  Of note, she has had multiple UTI's (E.coli and enterococcus) in the past with multi drug resistance.   SUBJECTIVE:   Currently on 60% FiO2 and 8 of PEEP  VITAL SIGNS: BP (!) 92/55   Pulse 92   Temp 99.7 F (37.6 C) (Oral)   Resp (!) 23   Ht 5\' 1"  (1.549 m)   Wt 123.4 kg (272 lb 0.8 oz)   LMP  (LMP Unknown)   SpO2 92%   BMI 51.40 kg/m   HEMODYNAMICS:    VENTILATOR SETTINGS: Vent Mode: PRVC FiO2 (%):  [60 %] 60 % Set Rate:  [20 bmp] 20 bmp Vt Set:  [380 mL] 380 mL PEEP:  [8 cmH20] 8 cmH20 Plateau Pressure:  [21 cmH20-24 cmH20] 24 cmH20  INTAKE / OUTPUT: I/O last 3 completed shifts: In: 2109.7 [I.V.:669.9; Other:10; NG/GT:656; IV Piggyback:773.9] Out: 1652.5 [Urine:1652.5]  PHYSICAL EXAMINATION: General: Morbidly obese female awake on vent HEENT: Short neck, endotracheal tube in place Neuro: Follows some commands. CV: Heart sounds are regular PULM: Coarse rhonchi bilaterally, remains on FiO2 of 60% 8 of PEEP with a  O2 saturation 94% CL:EXNT, non-tender, bsx4 active  Extremities: warm/dry, 2+ edema  Skin: no rashes or lesions   LABS:  BMET Recent Labs  Lab 03/31/18 1908 04/01/18 0535 04/02/18 0629  NA 142 142 146*  K 6.0* 5.2* 4.2  CL 102 104 104  CO2 33* 33* 34*  BUN 32* 37* 47*  CREATININE 1.63* 1.91* 2.56*  GLUCOSE 150* 159* 141*    Electrolytes Recent Labs  Lab 03/31/18 1908  04/01/18 0535 04/01/18 1201 04/01/18 1713 04/02/18 0629  CALCIUM 8.7*  --  8.4*  --   --  8.3*  MG  --    < > 2.0 2.0 2.0 2.0  PHOS  --    < > 2.9 2.6 2.6 3.0   < > = values in this interval not displayed.    CBC Recent Labs  Lab 03/31/18 1305 04/01/18 0535 04/02/18 0629  WBC 9.9 8.7 6.8  HGB 11.8* 10.6* 9.4*  HCT 43.6 37.5 32.7*  PLT 229 198 199    Coag's No results for input(s): APTT, INR in the last 168 hours.  Sepsis Markers Recent Labs  Lab 04/01/18 1201 04/02/18 0629  PROCALCITON 0.19 0.18    ABG Recent Labs  Lab 03/31/18 1525 03/31/18 1738 03/31/18 2109  PHART 7.174* 7.188* 7.305*  PCO2ART 102* 95.8* 68.1*  PO2ART 98.6  74.0* 61.0*    Liver Enzymes Recent Labs  Lab 03/31/18 1305  AST 14*  ALT 11*  ALKPHOS 63  BILITOT 0.9  ALBUMIN 3.0*    Cardiac Enzymes Recent Labs  Lab 03/31/18 1305  TROPONINI <0.03    Glucose Recent Labs  Lab 04/01/18 1147 04/01/18 1629 04/01/18 2014 04/01/18 2352 04/02/18 0339 04/02/18 0810  GLUCAP 141* 125* 152* 139* 112* 129*    Imaging Dg Chest Portable 1 View  Result Date: 04/02/2018 CLINICAL DATA:  Hypoxia EXAM: PORTABLE CHEST 1 VIEW COMPARISON:  April 01, 2018 FINDINGS: Endotracheal tube tip is 2.6 cm above the carina. Nasogastric tube tip and side port below the diaphragm. Central catheter tip is in the left innominate vein near the junction with the superior vena cava. No evident pneumothorax. There are pleural effusions bilaterally with patchy airspace consolidation in both lower lobe regions. No new opacity evident.  There is stable cardiomegaly. The pulmonary vascularity appears within normal limits. No evident adenopathy. There is aortic atherosclerosis. No evident bone lesions. IMPRESSION: Tube and catheter positions as described. No pneumothorax. There remains cardiomegaly with pleural effusions. Airspace consolidation in the bases is concerning for pneumonia. There may be alveolar edema as well. There is aortic atherosclerosis. Aortic Atherosclerosis (ICD10-I70.0). Electronically Signed   By: Lowella Grip III M.D.   On: 04/02/2018 08:25     STUDIES:  CXR 6/18 > mild interstitial edema. 2D echo 04/01/2018: EF 60 to 34% grade 1 diastolic dysfunction right ventricle systolic function was mildly reduced.  CULTURES: Blood 6/18 > gram-positive cocci/MSSA Sputum 6/18 > not done  ANTIBIOTICS: Vanc 6/18 > 04/01/2018 Zosyn 6/18 > 04/01/2018 Ancef 04/01/2018>>  SIGNIFICANT EVENTS: 6/18  Admit with AMS, hypercarbia   LINES/TUBES: 03/31/2018 endotracheal tube>>  DISCUSSION: 73 y.o. F from SNF admitted 6/18 with AoC hypercapnic respiratory failure, failed bipap requiring intubation 6/18.  Also concern for sepsis.  04/02/2018 continues to require high FiO2 and PEEP  ASSESSMENT / PLAN:  PULMONARY A: Acute on Chronic Hypercarbic Respiratory Failure  Severe OSA / probable OHS Pulmonary edema  ?HCAP P:   Vent bundle 04/02/2018 due to FiO2 of 60% 8 of PEEP not ready for extubation Wean as tolerated Keep her as dry as tolerated Trach in the future.  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  Hx HTN P:  Lasix as tolerated but continues to be positive fluid balance  RENAL Lab Results  Component Value Date   CREATININE 2.56 (H) 04/02/2018   CREATININE 1.91 (H) 04/01/2018   CREATININE 1.63 (H) 03/31/2018   CREATININE 1.40 (H)  11/05/2014   CREATININE 1.30 (H) 10/22/2014   Recent Labs  Lab 03/31/18 1908 04/01/18 0535 04/02/18 0629  K 6.0* 5.2* 4.2    A:   CKD III  Mild hyperkalemia P:   Avoid nephrotoxic Monitor electrolytes   GASTROINTESTINAL A:   Super Morbid Obesity  P:   Tube feeds  HEMATOLOGIC Recent Labs    04/01/18 0535 04/02/18 0629  HGB 10.6* 9.4*    A:   VTE prophylaxis Anemia - mild, no evidence of bleeding  P:  PAS/heparin Transfusions per protocol  INFECTIOUS A:   Concern for sepsis - unclear etiology.  Hx E.Coli and enterococcus UTI (with MDR), MRSA carrier ?HCAP P:   Continue Ancef started on 04/01/2018 Monitor  cultures gram-positive cocci in the blood most likely M SSA   ENDOCRINE CBG (last 3)  Recent Labs    04/01/18 2352 04/02/18 0339 04/02/18 0810  GLUCAP 139* 112* 129*    A:   Hx DM   P:   Sliding scale insulin per protocol Holding all home medications at this time for diabetes  NEUROLOGIC A:   Acute Metabolic Encephalopathy - due to hypercapnia +/- sepsis - improved  Sedation needs on vent  P:   RA SS goal 0 to -1 Sedation as needed   FAMILY  - Updates: 04/02/2018 no family at bedside  - Inter-disciplinary family meet or Palliative Care meeting due by:  04/07/18.   App cct 30 min   [name] Arbor Cohen ACNP [facility] PCCM Pager [phone] till 1 pm If no answer page [phone] 04/02/2018, 10:46 AM

## 2018-04-03 ENCOUNTER — Inpatient Hospital Stay (HOSPITAL_COMMUNITY): Payer: Medicare Other

## 2018-04-03 DIAGNOSIS — Z978 Presence of other specified devices: Secondary | ICD-10-CM

## 2018-04-03 LAB — GLUCOSE, CAPILLARY
Glucose-Capillary: 136 mg/dL — ABNORMAL HIGH (ref 65–99)
Glucose-Capillary: 140 mg/dL — ABNORMAL HIGH (ref 65–99)
Glucose-Capillary: 193 mg/dL — ABNORMAL HIGH (ref 65–99)
Glucose-Capillary: 160 mg/dL — ABNORMAL HIGH (ref 65–99)
Glucose-Capillary: 107 mg/dL — ABNORMAL HIGH (ref 65–99)

## 2018-04-03 LAB — PROCALCITONIN: Procalcitonin: 0.17 ng/mL

## 2018-04-03 LAB — CBC WITH DIFFERENTIAL/PLATELET
Abs Immature Granulocytes: 0 10*3/uL (ref 0.0–0.1)
Basophils Absolute: 0 10*3/uL (ref 0.0–0.1)
Basophils Relative: 0 %
Eosinophils Absolute: 0.4 10*3/uL (ref 0.0–0.7)
Eosinophils Relative: 7 %
HEMATOCRIT: 32.3 % — AB (ref 36.0–46.0)
HEMOGLOBIN: 9.2 g/dL — AB (ref 12.0–15.0)
IMMATURE GRANULOCYTES: 0 %
LYMPHS ABS: 0.9 10*3/uL (ref 0.7–4.0)
LYMPHS PCT: 15 %
MCH: 25.1 pg — ABNORMAL LOW (ref 26.0–34.0)
MCHC: 28.5 g/dL — ABNORMAL LOW (ref 30.0–36.0)
MCV: 88.3 fL (ref 78.0–100.0)
Monocytes Absolute: 1 10*3/uL (ref 0.1–1.0)
Monocytes Relative: 17 %
NEUTROS ABS: 3.5 10*3/uL (ref 1.7–7.7)
NEUTROS PCT: 61 %
Platelets: 204 10*3/uL (ref 150–400)
RBC: 3.66 MIL/uL — AB (ref 3.87–5.11)
RDW: 20.6 % — ABNORMAL HIGH (ref 11.5–15.5)
WBC: 5.8 10*3/uL (ref 4.0–10.5)

## 2018-04-03 LAB — BASIC METABOLIC PANEL
ANION GAP: 8 (ref 5–15)
BUN: 51 mg/dL — ABNORMAL HIGH (ref 6–20)
CHLORIDE: 105 mmol/L (ref 101–111)
CO2: 36 mmol/L — AB (ref 22–32)
Calcium: 8.5 mg/dL — ABNORMAL LOW (ref 8.9–10.3)
Creatinine, Ser: 2.25 mg/dL — ABNORMAL HIGH (ref 0.44–1.00)
GFR calc non Af Amer: 20 mL/min — ABNORMAL LOW (ref >60)
GFR, EST AFRICAN AMERICAN: 24 mL/min — AB (ref >60)
Glucose, Bld: 126 mg/dL — ABNORMAL HIGH (ref 65–99)
Potassium: 3.6 mmol/L (ref 3.5–5.1)
SODIUM: 149 mmol/L — AB (ref 135–145)

## 2018-04-03 LAB — PHOSPHORUS: PHOSPHORUS: 4 mg/dL (ref 2.5–4.6)

## 2018-04-03 LAB — MAGNESIUM: MAGNESIUM: 2.2 mg/dL (ref 1.7–2.4)

## 2018-04-03 LAB — CULTURE, BLOOD (ROUTINE X 2)

## 2018-04-03 LAB — TRIGLYCERIDES: TRIGLYCERIDES: 148 mg/dL (ref <150)

## 2018-04-03 MED ORDER — FREE WATER
200.0000 mL | Freq: Three times a day (TID) | Status: DC
  Administered 2018-04-03 – 2018-04-05 (×6): 200 mL

## 2018-04-03 MED ORDER — WHITE PETROLATUM EX OINT
TOPICAL_OINTMENT | CUTANEOUS | Status: AC
  Administered 2018-04-03: 0.2
  Filled 2018-04-03: qty 28.35

## 2018-04-03 MED ORDER — INSULIN ASPART 100 UNIT/ML ~~LOC~~ SOLN
4.0000 [IU] | SUBCUTANEOUS | Status: DC
  Administered 2018-04-03 – 2018-04-07 (×23): 4 [IU] via SUBCUTANEOUS

## 2018-04-03 MED ORDER — POTASSIUM CHLORIDE 20 MEQ/15ML (10%) PO SOLN
40.0000 meq | Freq: Once | ORAL | Status: AC
  Administered 2018-04-03: 40 meq via ORAL
  Filled 2018-04-03: qty 30

## 2018-04-03 NOTE — Progress Notes (Signed)
PULMONARY / CRITICAL CARE MEDICINE   Name: Heather Zimmerman MRN: [id number] DOB: 08/17/1946    ADMISSION DATE:  03/31/2018 CONSULTATION DATE:  03/31/18  REFERRING MD:  Dr. Vanita Panda  CHIEF COMPLAINT:  AMS, hypoxia   HISTORY OF PRESENT ILLNESS: Heather Zimmerman is a 72 y.o. F with hx severe OSA, dCHF,  CKD III, HTN presented [facility] on 6/18 with SOB and respiratory distress.  She was reportedly seen early 6/18 and in usual state of health.  She apparently went to speech therapy and after completion, she was noted to be dyspneic, non-verbal and altered.  She was put on a NRB for hypoxia.  Sats were in the 60's. The patient was given narcan without significant response.  She was transported to ED where she remained altered and hypoxic.  She was subsequently placed on BiPAP support 12/8. CXR showed cardiomegaly and concern for pulmonary edema.  ABG 7.174 / 102 / 98 / 36.    Due to needs for BiPAP and concern for deterioration, PCCM was consulted for evaluation.  Of note, she has had multiple UTI's (E.coli and enterococcus) in the past with multi drug resistance.   SUBJECTIVE:   Currently on 40% FiO2 and 8 of PEEP , rate of 20 She is not overbreathing the vent  VITAL SIGNS: BP (!) 153/73   Pulse 100   Temp 99 F (37.2 C) (Oral)   Resp (!) 23   Ht 5\' 1"  (1.549 m)   Wt 273 lb 9.5 oz (124.1 kg)   LMP  (LMP Unknown)   SpO2 92%   BMI 51.69 kg/m   HEMODYNAMICS:    VENTILATOR SETTINGS: Vent Mode: PRVC FiO2 (%):  [40 %-60 %] 40 % Set Rate:  [20 bmp] 20 bmp Vt Set:  [380 mL] 380 mL PEEP:  [8 cmH20] 8 cmH20 Plateau Pressure:  [21 cmH20-22 cmH20] 21 cmH20  INTAKE / OUTPUT: I/O last 3 completed shifts: In: 2556.1 [I.V.:697.2; NG/GT:1590; IV Piggyback:269] Out: 2825 [Urine:2825]  PHYSICAL EXAMINATION: General: Morbidly obese female awake on vent, non-assist HEENT: Short neck, endotracheal tube in place Neuro: Awake and alert, following commands selectively. CV: S1, S2, RRR, No  RMG PULM: Coarse throughout, bilateral chest excursion, remains on FiO2 of 40%,  8 of PEEP with a O2 saturation 94% IR:CVEL, non-tender, bs x 4 active, obese  Extremities: warm/dry, 2+ edema, warm and dry, no obvious deformities  Skin: no rashes or lesions   LABS:  BMET Recent Labs  Lab 04/01/18 0535 04/02/18 0629 04/03/18 0540  NA 142 146* 149*  K 5.2* 4.2 3.6  CL 104 104 105  CO2 33* 34* 36*  BUN 37* 47* 51*  CREATININE 1.91* 2.56* 2.25*  GLUCOSE 159* 141* 126*    Electrolytes Recent Labs  Lab 04/01/18 0535  04/02/18 0629 04/02/18 1654 04/03/18 0540  CALCIUM 8.4*  --  8.3*  --  8.5*  MG 2.0   < > 2.0 2.0 2.2  PHOS 2.9   < > 3.0 3.8 4.0   < > = values in this interval not displayed.    CBC Recent Labs  Lab 04/01/18 0535 04/02/18 0629 04/03/18 0540  WBC 8.7 6.8 5.8  HGB 10.6* 9.4* 9.2*  HCT 37.5 32.7* 32.3*  PLT 198 199 204    Coag's No results for input(s): APTT, INR in the last 168 hours.  Sepsis Markers Recent Labs  Lab 04/01/18 1201 04/02/18 0629 04/03/18 0540  PROCALCITON 0.19 0.18 0.17    ABG Recent Labs  Lab  03/31/18 1525 03/31/18 1738 03/31/18 2109  PHART 7.174* 7.188* 7.305*  PCO2ART 102* 95.8* 68.1*  PO2ART 98.6 74.0* 61.0*    Liver Enzymes Recent Labs  Lab 03/31/18 1305  AST 14*  ALT 11*  ALKPHOS 63  BILITOT 0.9  ALBUMIN 3.0*    Cardiac Enzymes Recent Labs  Lab 03/31/18 1305  TROPONINI <0.03    Glucose Recent Labs  Lab 04/02/18 1606 04/02/18 1958 04/02/18 2339 04/03/18 0339 04/03/18 0714 04/03/18 1130  GLUCAP 137* 132* 159* 136* 140* 193*    Imaging Dg Chest Port 1 View  Result Date: 04/03/2018 CLINICAL DATA:  Respiratory failure. EXAM: PORTABLE CHEST 1 VIEW COMPARISON:  04/02/2018. FINDINGS: Endotracheal tube, NG tube left central line in stable position. Cardiomegaly again noted. Bibasilar and left mid lung field pulmonary infiltrates/edema again noted. Small pleural effusions again noted. No  pneumothorax. IMPRESSION: 1.  Lines and tubes in stable position. 2. Cardiomegaly with bibasilar and left mid lung field pulmonary infiltrates/edema again noted. Small pleural effusions again noted. Similar findings on prior exam. No pneumothorax. Electronically Signed   By: Marcello Moores  Register   On: 04/03/2018 05:58     STUDIES:  CXR 6/21 > continued mild interstitial edema. 2D echo 04/01/2018: EF 60 to 42% grade 1 diastolic dysfunction right ventricle systolic function was mildly reduced.  CULTURES: Blood 6/18 > gram-positive cocci/MSSA Sputum 6/18 > not done Blood 6/21>>  ANTIBIOTICS: Vanc 6/18 > 04/01/2018 Zosyn 6/18 > 04/01/2018 Ancef 04/01/2018>>  SIGNIFICANT EVENTS: 6/18  Admit with AMS, hypercarbia   LINES/TUBES: 03/31/2018 endotracheal tube>>  DISCUSSION: 72 y.o. F from SNF admitted 6/18 with AoC hypercapnic respiratory failure, failed bipap requiring intubation 6/18.  Also concern for sepsis.  04/03/2018 weaning  FiO2 ,  PEEP remains at 8  ASSESSMENT / PLAN:  PULMONARY A: Acute on Chronic Hypercarbic Respiratory Failure  Severe OSA / probable OHS Pulmonary edema  ? HCAP P:   Vent bundle Titrate oxygen and PEEP as able 04/03/2018  FiO2 decreased to 40%,  8 of PEEP, not assisting vent, failed wean this am Decrease rate to 14, ABG in 1 hour Keep her as dry as tolerated>> continue lasix Q 6 Trach in the future if indicated. CXR prn  CARDIOVASCULAR  Intake/Output Summary (Last 24 hours) at 04/03/2018 1157 Last data filed at 04/03/2018 1000 Gross per 24 hour  Intake 1603.63 ml  Output 2622.5 ml  Net -1018.87 ml    A:  Acute on chronic dCHF  Hx HTN P:  Continue Lasix as renal function allows Monitor I&O Tele monitoring     RENAL Lab Results  Component Value Date   CREATININE 2.25 (H) 04/03/2018   CREATININE 2.56 (H) 04/02/2018   CREATININE 1.91 (H) 04/01/2018   CREATININE 1.40 (H) 11/05/2014   CREATININE 1.30 (H) 10/22/2014   Recent Labs  Lab  04/01/18 0535 04/02/18 0629 04/03/18 0540  K 5.2* 4.2 3.6    A:   CKD III  Mild hyperkalemia>> resolved P:   Avoid nephrotoxicmedications Monitor electrolytes and replete as needed Conservative K replacement Trend BMET and UO  GASTROINTESTINAL A:   Super Morbid Obesity  P:   Tube feeds  HEMATOLOGIC Recent Labs    04/02/18 0629 04/03/18 0540  HGB 9.4* 9.2*    A:   VTE prophylaxis Anemia - mild, no evidence of bleeding  P:  PAS/heparin Transfusions per protocol Monitor for active bleeding  INFECTIOUS A:   Concern for sepsis - unclear etiology.  Hx E.Coli and enterococcus UTI (with MDR), MRSA  carrier ?HCAP Afebrile WBC WNL P:   Continue Ancef started on 04/01/2018 Monitor cultures gram-positive cocci in the blood most likely M SSA Will re-collect BC x 2 to confirm clearing   ENDOCRINE CBG (last 3)  Recent Labs    04/03/18 0339 04/03/18 0714 04/03/18 1130  GLUCAP 136* 140* 193*    A:   Hx DM   CBG's elevated  P:   Sliding scale insulin per protocol CBG's elevated Will add TF coverage  NEUROLOGIC A:   Acute Metabolic Encephalopathy - due to hypercapnia +/- sepsis - improved  Sedation needs on vent  P:   RA SS goal 0 to -1 Minimize sedation   FAMILY  - Updates: 04/03/2018 no family at bedside  - Inter-disciplinary family meet or Palliative Care meeting due by:  04/07/18.    Magdalen Spatz, AGACNP-BC [facility] PCCM If no answer page [phone] 04/03/2018, 11:57 AM

## 2018-04-03 NOTE — Progress Notes (Signed)
Pt placed on wean by CCM. 

## 2018-04-04 ENCOUNTER — Inpatient Hospital Stay (HOSPITAL_COMMUNITY): Payer: Medicare Other

## 2018-04-04 LAB — GLUCOSE, CAPILLARY
GLUCOSE-CAPILLARY: 125 mg/dL — AB (ref 65–99)
GLUCOSE-CAPILLARY: 153 mg/dL — AB (ref 65–99)
Glucose-Capillary: 154 mg/dL — ABNORMAL HIGH (ref 65–99)
Glucose-Capillary: 155 mg/dL — ABNORMAL HIGH (ref 65–99)
Glucose-Capillary: 166 mg/dL — ABNORMAL HIGH (ref 65–99)
Glucose-Capillary: 182 mg/dL — ABNORMAL HIGH (ref 65–99)
Glucose-Capillary: 186 mg/dL — ABNORMAL HIGH (ref 65–99)

## 2018-04-04 LAB — BASIC METABOLIC PANEL
ANION GAP: 9 (ref 5–15)
BUN: 45 mg/dL — ABNORMAL HIGH (ref 6–20)
CHLORIDE: 100 mmol/L — AB (ref 101–111)
CO2: 41 mmol/L — ABNORMAL HIGH (ref 22–32)
Calcium: 8.8 mg/dL — ABNORMAL LOW (ref 8.9–10.3)
Creatinine, Ser: 1.76 mg/dL — ABNORMAL HIGH (ref 0.44–1.00)
GFR calc Af Amer: 32 mL/min — ABNORMAL LOW (ref >60)
GFR, EST NON AFRICAN AMERICAN: 28 mL/min — AB (ref >60)
Glucose, Bld: 140 mg/dL — ABNORMAL HIGH (ref 65–99)
POTASSIUM: 3.4 mmol/L — AB (ref 3.5–5.1)
SODIUM: 150 mmol/L — AB (ref 135–145)

## 2018-04-04 LAB — CBC
HCT: 35.6 % — ABNORMAL LOW (ref 36.0–46.0)
HEMOGLOBIN: 10 g/dL — AB (ref 12.0–15.0)
MCH: 25 pg — ABNORMAL LOW (ref 26.0–34.0)
MCHC: 28.1 g/dL — ABNORMAL LOW (ref 30.0–36.0)
MCV: 89 fL (ref 78.0–100.0)
PLATELETS: 206 10*3/uL (ref 150–400)
RBC: 4 MIL/uL (ref 3.87–5.11)
RDW: 20.1 % — ABNORMAL HIGH (ref 11.5–15.5)
WBC: 5.2 10*3/uL (ref 4.0–10.5)

## 2018-04-04 MED ORDER — FUROSEMIDE 10 MG/ML IJ SOLN
60.0000 mg | Freq: Three times a day (TID) | INTRAMUSCULAR | Status: DC
  Administered 2018-04-04 – 2018-04-05 (×3): 60 mg via INTRAVENOUS
  Filled 2018-04-04 (×3): qty 6

## 2018-04-04 MED ORDER — CEFAZOLIN SODIUM-DEXTROSE 2-4 GM/100ML-% IV SOLN
2.0000 g | Freq: Three times a day (TID) | INTRAVENOUS | Status: DC
  Administered 2018-04-04 – 2018-04-07 (×8): 2 g via INTRAVENOUS
  Filled 2018-04-04 (×9): qty 100

## 2018-04-04 MED ORDER — DEXTROSE 50 % IV SOLN
INTRAVENOUS | Status: AC
  Filled 2018-04-04: qty 50

## 2018-04-04 MED ORDER — FENTANYL CITRATE (PF) 100 MCG/2ML IJ SOLN: 50.0000 ug | INTRAMUSCULAR | Status: DC | PRN

## 2018-04-04 MED ORDER — POTASSIUM CHLORIDE 20 MEQ/15ML (10%) PO SOLN
40.0000 meq | Freq: Once | ORAL | Status: AC
  Administered 2018-04-04: 40 meq via ORAL
  Filled 2018-04-04: qty 30

## 2018-04-04 NOTE — Progress Notes (Signed)
Pharmacy Antibiotic Note  Heather Zimmerman is a 72 y.o. female admitted on 03/31/2018 with bacteremia.  Pharmacy has been consulted for cefazolin dosing. Currently growing 3/4 coag negative staph. On D#4 of abx. Renal function has improved today. Scr down to 1.76. CrCl ~ 35 mL/min. WBC remains WNL and patient afebrile.   Plan: -Increase cefazolin to 2 gm every 8 hours  -Monitor cultures and renal fx   Height: 5\' 1"  (154.9 cm) Weight: 278 lb 14.1 oz (126.5 kg) IBW/kg (Calculated) : 47.8  Temp (24hrs), Avg:98.4 F (36.9 C), Min:98 F (36.7 C), Max:98.8 F (37.1 C)  Recent Labs  Lab 03/31/18 1305 03/31/18 1908 04/01/18 0535 04/02/18 0629 04/03/18 0540 04/04/18 0419  WBC 9.9  --  8.7 6.8 5.8 5.2  CREATININE 1.67* 1.63* 1.91* 2.56* 2.25* 1.76*    Estimated Creatinine Clearance: 35.6 mL/min (A) (by C-G formula based on SCr of 1.76 mg/dL (H)).    Allergies  Allergen Reactions  . Ace Inhibitors Other (See Comments)    unknown    6/18 Zosyn >> 6/19 6/18 Vanco >>6/19 6/19 Cefazolin >>   6/18 MRSA PCR pos 6/18 BCx2>> 3/4 CONS  6/21 BCx X2 : NGTD  Thank you for allowing pharmacy to be a part of this patient's care.  Jimmy Footman, PharmD, BCPS PGY2 Infectious Diseases Pharmacy Resident Clinical phone for 04/04/18 until 3:30pm: [phone] If after 3:30pm, please call main pharmacy at: [phone]

## 2018-04-04 NOTE — Progress Notes (Addendum)
PULMONARY / CRITICAL CARE MEDICINE   Name: Heather Zimmerman MRN: [id number] DOB: 1946-08-07    ADMISSION DATE:  03/31/2018 CONSULTATION DATE:  03/31/18  REFERRING MD:  Dr. Vanita Panda  CHIEF COMPLAINT:  AMS, hypoxia   HISTORY OF PRESENT ILLNESS: Heather Zimmerman. Joens is a 72 y.o. F with hx severe OSA, dCHF,  CKD III, HTN presented [facility] on 6/18 with SOB and respiratory distress.  She was reportedly seen early 6/18 and in usual state of health.  She apparently went to speech therapy and after completion, she was noted to be dyspneic, non-verbal and altered.  She was put on a NRB for hypoxia.  Sats were in the 60's. The patient was given narcan without significant response.  She was transported to ED where she remained altered and hypoxic.  She was subsequently placed on BiPAP support 12/8. CXR showed cardiomegaly and concern for pulmonary edema.  ABG 7.174 / 102 / 98 / 36.    Due to needs for BiPAP and concern for deterioration, PCCM was consulted for evaluation.  Of note, she has had multiple UTI's (E.coli and enterococcus) in the past with multi drug resistance.   SUBJECTIVE:   Failed wean this am   VITAL SIGNS: BP (!) 152/76   Pulse 75   Temp 98.8 F (37.1 C) (Oral)   Resp 14   Ht _0  (1.549 m)   Wt 278 lb 14.1 oz (126.5 kg)   LMP  (LMP Unknown)   SpO2 98%   BMI 52.69 kg/m   HEMODYNAMICS:    VENTILATOR SETTINGS: Vent Mode: PRVC FiO2 (%):  [40 %] 40 % Set Rate:  [14 bmp-20 bmp] 14 bmp Vt Set:  [380 mL] 380 mL PEEP:  [8 cmH20] 8 cmH20 Pressure Support:  [8 cmH20-10 cmH20] 10 cmH20 Plateau Pressure:  [21 cmH20-24 cmH20] 24 cmH20  INTAKE / OUTPUT: I/O last 3 completed shifts: In: 2067.7 [I.V.:511.9; NG/GT:1220; IV Piggyback:335.7] Out: 6250 [Urine:6250]  PHYSICAL EXAMINATION: General: Morbidly obese female awake on vent, non-assist HEENT: Short neck, endotracheal tube in place Neuro: Awake and alert, following commands selectively. CV: S1, S2, RRR, No RMG PULM: Coarse  throughout, bilateral chest excursion, remains on FiO2 of 40%,  8 of PEEP with a O2 saturation 94% PR:XYVO, non-tender, bs x 4 active, obese  Extremities: warm/dry, 2+ edema, warm and dry, no obvious deformities  Skin: no rashes or lesions   LABS:  BMET Recent Labs  Lab 04/02/18 0629 04/03/18 0540 04/04/18 0419  NA 146* 149* 150*  K 4.2 3.6 3.4*  CL 104 105 100*  CO2 34* 36* 41*  BUN 47* 51* 45*  CREATININE 2.56* 2.25* 1.76*  GLUCOSE 141* 126* 140*    Electrolytes Recent Labs  Lab 04/02/18 0629 04/02/18 1654 04/03/18 0540 04/04/18 0419  CALCIUM 8.3*  --  8.5* 8.8*  MG 2.0 2.0 2.2  --   PHOS 3.0 3.8 4.0  --     CBC Recent Labs  Lab 04/02/18 0629 04/03/18 0540 04/04/18 0419  WBC 6.8 5.8 5.2  HGB 9.4* 9.2* 10.0*  HCT 32.7* 32.3* 35.6*  PLT 199 204 206    Coag's No results for input(s): APTT, INR in the last 168 hours.  Sepsis Markers Recent Labs  Lab 04/01/18 1201 04/02/18 0629 04/03/18 0540  PROCALCITON 0.19 0.18 0.17    ABG Recent Labs  Lab 03/31/18 1525 03/31/18 1738 03/31/18 2109  PHART 7.174* 7.188* 7.305*  PCO2ART 102* 95.8* 68.1*  PO2ART 98.6 74.0* 61.0*    Liver  Enzymes Recent Labs  Lab 03/31/18 1305  AST 14*  ALT 11*  ALKPHOS 63  BILITOT 0.9  ALBUMIN 3.0*    Cardiac Enzymes Recent Labs  Lab 03/31/18 1305  TROPONINI <0.03    Glucose Recent Labs  Lab 04/03/18 1130 04/03/18 1534 04/03/18 2021 04/03/18 2359 04/04/18 0353 04/04/18 0828  GLUCAP 193* 160* 107* 182* 125* 155*    Imaging Dg Chest Port 1 View  Result Date: 04/04/2018 CLINICAL DATA:  Respiratory failure. EXAM: PORTABLE CHEST 1 VIEW COMPARISON:  04/03/2018 FINDINGS: Endotracheal tube has tip 2.1 cm above the carina. Left IJ central venous catheter unchanged with tip over the SVC. Nasogastric tube is present with tip over the distal stomach in the midline and side-port over the stomach in the left upper quadrant. Lungs are adequately inflated with  interval improvement in previously noted left mid lung and bibasilar opacification likely improving interstitial edema. Persistent left base/retrocardiac opacification with obscuration of the left hemidiaphragm likely due to effusion and atelectasis although infection is possible. Stable cardiomegaly. Remainder of the exam is unchanged. IMPRESSION: Moderate interval improvement in left mid lung and bibasilar opacification likely improving interstitial edema. Persistent left base/retrocardiac opacification likely effusion with atelectasis although infection is possible. Tubes and lines as described. Electronically Signed   By: Marin Olp M.D.   On: 04/04/2018 09:04     STUDIES:  CXR 6/21 > continued mild interstitial edema. 2D echo 04/01/2018: EF 60 to 79% grade 1 diastolic dysfunction right ventricle systolic function was mildly reduced.  CULTURES: Blood 6/18 > gram-positive cocci/MSSA Sputum 6/18 > not done Blood 6/21>>  ANTIBIOTICS: Vanc 6/18 > 04/01/2018 Zosyn 6/18 > 04/01/2018 Ancef 04/01/2018>>  SIGNIFICANT EVENTS: 6/18  Admit with AMS, hypercarbia   LINES/TUBES: 03/31/2018 endotracheal tube>>  DISCUSSION: 72 y.o. F from SNF admitted 6/18 with AoC hypercapnic respiratory failure, failed bipap requiring intubation 6/18.  Also concern for sepsis.  04/03/2018 weaning  FiO2 ,  PEEP remains at 8  ASSESSMENT / PLAN:  PULMONARY A: Acute on Chronic Hypercarbic Respiratory Failure  Severe OSA / probable OHS Pulmonary edema  ? HCAP P:   Vent bundle Daily SBT  Titrate oxygen and PEEP as able  Keep her as dry as tolerated>> continue lasix Q 6 Trach in the future if indicated. CXR prn Cont BD nebs   CARDIOVASCULAR  Intake/Output Summary (Last 24 hours) at 04/04/2018 0944 Last data filed at 04/04/2018 0900 Gross per 24 hour  Intake 1189.93 ml  Output 6002.5 ml  Net -4812.57 ml    A:  Acute on chronic dCHF  Hx HTN P:  Good UOP , neg bal , decrease lasix 60 q8  Monitor  I&O Tele monitoring     RENAL Lab Results  Component Value Date   CREATININE 1.76 (H) 04/04/2018   CREATININE 2.25 (H) 04/03/2018   CREATININE 2.56 (H) 04/02/2018   CREATININE 1.40 (H) 11/05/2014   CREATININE 1.30 (H) 10/22/2014   Recent Labs  Lab 04/02/18 0629 04/03/18 0540 04/04/18 0419  K 4.2 3.6 3.4*    A:   CKD III  Mild hyperkalemia>> resolved Hypokalemia  Hypernatremia  P:   Avoid nephrotoxicmedications Monitor electrolytes and replete as needed Conservative K replacement Trend BMET and UO Cont free water  GASTROINTESTINAL A:   Super Morbid Obesity  P:   Tube feeds  HEMATOLOGIC Recent Labs    04/03/18 0540 04/04/18 0419  HGB 9.2* 10.0*    A:   VTE prophylaxis Anemia - mild, no evidence of bleeding  P:  PAS/heparin Transfusions per protocol Monitor for active bleeding  INFECTIOUS A:   Concern for sepsis - unclear etiology.  Hx E.Coli and enterococcus UTI (with MDR), MRSA carrier ?HCAP Afebrile, WBC WNL P:   Continue Ancef started on 04/01/2018  Follow cx data    ENDOCRINE CBG (last 3)  Recent Labs    04/03/18 2359 04/04/18 0353 04/04/18 0828  GLUCAP 182* 125* 155*    A:   Hx DM   BS improving  P:   Sliding scale insulin per protocol CBG's elevated SSI TF coverage  NEUROLOGIC A:   Acute Metabolic Encephalopathy - due to hypercapnia +/- sepsis - improved  Sedation needs on vent  P:   RA SS goal 0 to -1 Minimize sedation   FAMILY  - Updates: 04/03/2018 no family at bedside  - Inter-disciplinary family meet or Palliative Care meeting due by:  04/07/18.   Tammy Parrett NP-C  [facility]  [phone]   04/04/2018

## 2018-04-05 ENCOUNTER — Inpatient Hospital Stay (HOSPITAL_COMMUNITY): Payer: Medicare Other

## 2018-04-05 LAB — GLUCOSE, CAPILLARY
GLUCOSE-CAPILLARY: 180 mg/dL — AB (ref 65–99)
GLUCOSE-CAPILLARY: 157 mg/dL — AB (ref 65–99)
GLUCOSE-CAPILLARY: 153 mg/dL — AB (ref 65–99)
Glucose-Capillary: 193 mg/dL — ABNORMAL HIGH (ref 65–99)
Glucose-Capillary: 129 mg/dL — ABNORMAL HIGH (ref 65–99)

## 2018-04-05 LAB — PHOSPHORUS: Phosphorus: 3.1 mg/dL (ref 2.5–4.6)

## 2018-04-05 LAB — CBC
HEMATOCRIT: 36.3 % (ref 36.0–46.0)
HEMOGLOBIN: 10.3 g/dL — AB (ref 12.0–15.0)
MCH: 25.2 pg — ABNORMAL LOW (ref 26.0–34.0)
MCHC: 28.4 g/dL — ABNORMAL LOW (ref 30.0–36.0)
MCV: 89 fL (ref 78.0–100.0)
PLATELETS: 216 10*3/uL (ref 150–400)
RBC: 4.08 MIL/uL (ref 3.87–5.11)
RDW: 20 % — ABNORMAL HIGH (ref 11.5–15.5)
WBC: 5.5 10*3/uL (ref 4.0–10.5)

## 2018-04-05 LAB — BASIC METABOLIC PANEL
ANION GAP: 8 (ref 5–15)
BUN: 42 mg/dL — ABNORMAL HIGH (ref 6–20)
CHLORIDE: 96 mmol/L — AB (ref 101–111)
CO2: 45 mmol/L — AB (ref 22–32)
Calcium: 9.1 mg/dL (ref 8.9–10.3)
Creatinine, Ser: 1.5 mg/dL — ABNORMAL HIGH (ref 0.44–1.00)
GFR calc non Af Amer: 33 mL/min — ABNORMAL LOW (ref >60)
GFR, EST AFRICAN AMERICAN: 39 mL/min — AB (ref >60)
Glucose, Bld: 188 mg/dL — ABNORMAL HIGH (ref 65–99)
POTASSIUM: 3.3 mmol/L — AB (ref 3.5–5.1)
SODIUM: 149 mmol/L — AB (ref 135–145)

## 2018-04-05 LAB — MAGNESIUM: Magnesium: 1.8 mg/dL (ref 1.7–2.4)

## 2018-04-05 MED ORDER — FREE WATER
400.0000 mL | Freq: Four times a day (QID) | Status: DC
  Administered 2018-04-05 – 2018-04-06 (×5): 400 mL

## 2018-04-05 MED ORDER — FUROSEMIDE 10 MG/ML IJ SOLN
60.0000 mg | Freq: Every day | INTRAMUSCULAR | Status: DC
  Administered 2018-04-06: 60 mg via INTRAVENOUS
  Filled 2018-04-05: qty 6

## 2018-04-05 MED ORDER — POTASSIUM CHLORIDE 10 MEQ/50ML IV SOLN
10.0000 meq | INTRAVENOUS | Status: AC
  Administered 2018-04-05 (×3): 10 meq via INTRAVENOUS
  Filled 2018-04-05 (×3): qty 50

## 2018-04-05 NOTE — Progress Notes (Signed)
PULMONARY / CRITICAL CARE MEDICINE   Name: Heather Zimmerman MRN: [id number] DOB: 1945/11/09    ADMISSION DATE:  03/31/2018 CONSULTATION DATE:  03/31/18  REFERRING MD:  Vanita Panda  CHIEF COMPLAINT:  Confusion, hypoxemia  HISTORY OF PRESENT ILLNESS:   72 y/o female OSA, CKD, HTN, presented with acute respiratory failure due to CHF exacerbation.    SUBJECTIVE:  Diuresed yesterday On pressure support 10/8  VITAL SIGNS: BP 135/71   Pulse 76   Temp 98.5 F (36.9 C) (Oral)   Resp (!) 22   Ht 5\' 1"  (1.549 m)   Wt 126.5 kg (278 lb 14.1 oz)   LMP  (LMP Unknown)   SpO2 97%   BMI 52.69 kg/m   HEMODYNAMICS:    VENTILATOR SETTINGS: Vent Mode: CPAP;PSV FiO2 (%):  [40 %] 40 % Set Rate:  [14 bmp] 14 bmp Vt Set:  [380 mL-420 mL] 420 mL PEEP:  [8 cmH20] 8 cmH20 Pressure Support:  [10 cmH20] 10 cmH20 Plateau Pressure:  [11 cmH20-24 cmH20] 11 cmH20  INTAKE / OUTPUT: I/O last 3 completed shifts: In: 961.6 [I.V.:461.9; NG/GT:200; IV Piggyback:299.7] Out: 6500 [Urine:6500]  PHYSICAL EXAMINATION:  General:  In bed on vent HENT: NCAT ETT in place PULM: CTA B, vent supported breathing CV: RRR, no mgr GI: BS+, soft, nontender MSK: normal bulk and tone Neuro: awake on vent, following commands    LABS:  BMET Recent Labs  Lab 04/03/18 0540 04/04/18 0419 04/05/18 0322  NA 149* 150* 149*  K 3.6 3.4* 3.3*  CL 105 100* 96*  CO2 36* 41* 45*  BUN 51* 45* 42*  CREATININE 2.25* 1.76* 1.50*  GLUCOSE 126* 140* 188*    Electrolytes Recent Labs  Lab 04/02/18 1654 04/03/18 0540 04/04/18 0419 04/05/18 0322  CALCIUM  --  8.5* 8.8* 9.1  MG 2.0 2.2  --  1.8  PHOS 3.8 4.0  --  3.1    CBC Recent Labs  Lab 04/03/18 0540 04/04/18 0419 04/05/18 0322  WBC 5.8 5.2 5.5  HGB 9.2* 10.0* 10.3*  HCT 32.3* 35.6* 36.3  PLT 204 206 216    Coag's No results for input(s): APTT, INR in the last 168 hours.  Sepsis Markers Recent Labs  Lab 04/01/18 1201 04/02/18 0629  04/03/18 0540  PROCALCITON 0.19 0.18 0.17    ABG Recent Labs  Lab 03/31/18 1525 03/31/18 1738 03/31/18 2109  PHART 7.174* 7.188* 7.305*  PCO2ART 102* 95.8* 68.1*  PO2ART 98.6 74.0* 61.0*    Liver Enzymes Recent Labs  Lab 03/31/18 1305  AST 14*  ALT 11*  ALKPHOS 63  BILITOT 0.9  ALBUMIN 3.0*    Cardiac Enzymes Recent Labs  Lab 03/31/18 1305  TROPONINI <0.03    Glucose Recent Labs  Lab 04/04/18 1209 04/04/18 1643 04/04/18 2022 04/04/18 2339 04/05/18 0421 04/05/18 0842  GLUCAP 186* 166* 153* 154* 180* 193*    Imaging No results found.   STUDIES:  CXR 6/21 > continued mild interstitial edema. 2D echo 04/01/2018: EF 60 to 33% grade 1 diastolic dysfunction right ventricle systolic function was mildly reduced.  CULTURES: Blood 6/18 > gram-positive cocci/MSSA Sputum 6/18 > not done Blood 6/21>>  ANTIBIOTICS: Vanc 6/18 > 04/01/2018 Zosyn 6/18 > 04/01/2018 Ancef 04/01/2018>>  SIGNIFICANT EVENTS: 6/18  Admit with AMS, hypercarbia   LINES/TUBES: 03/31/2018 endotracheal tube>>    DISCUSSION: 72 y/o female with acute respiratory failure with hypoxemia due to a CHF Exacerbaiton. She has severe OSA at baseline.   ASSESSMENT / PLAN:  PULMONARY A:  Acute respiratory failure with hypoxemia Acute pulmonary edema OSA P:   Continue diuresis Pressure support 10/8 as long as tolerated today Keeping high PEEP with obesity/high intrathoracic pressures Full mechanical vent support VAP prevention Daily WUA/SBT Brovana/pulmicort  CARDIOVASCULAR A:  Acute on chronic diastolic heart failure P:  Tele Adjust diuretics to daily dosing  RENAL A:   CKD > improving Cr with diuresis Hypokalemia Hypernatermia P:   Monitor BMET and UOP Replace electrolytes as needed Increase free water  GASTROINTESTINAL A:   Morbid obesity P:   Continue tube feeding Continue medical stress ulcer prophylaxis   HEMATOLOGIC A:   Anemia without bleeding DVT  prophylaxis P:  Monitor for bleeding Transfuse PRBC for Hgb < 7 gm/dL Sub q heparin  INFECTIOUS A:   Coag neg staph in blood> contaminant P:   Stop ancef, monitor closely for fever   ENDOCRINE A:   DM2 P:   SSI  NEUROLOGIC A:   Need for sedation while mechanically ventilated P:   RASS goal: 0 to -1 Minimize sedation   FAMILY  - Updates: none bedside  - Inter-disciplinary family meet or Palliative Care meeting due by:  Day 7   My cc time 35 minutes  Roselie Awkward, MD [place] [facility] Pager: [phone] Cell: [phone] After 3pm or if no response, call [phone]   04/05/2018, 9:20 AM

## 2018-04-05 NOTE — Progress Notes (Signed)
eLink Physician-Brief Progress Note Patient Name: Heather Zimmerman. Viscomi DOB: 04-27-1946 MRN: [id number]   Date of Service  04/05/2018  HPI/Events of Note  Potassium 3.3  eICU Interventions  replaced     Intervention Category Minor Interventions: Electrolytes abnormality - evaluation and management  Mauri [place], P 04/05/2018, 5:29 AM

## 2018-04-06 ENCOUNTER — Inpatient Hospital Stay (HOSPITAL_COMMUNITY): Payer: Medicare Other

## 2018-04-06 LAB — GLUCOSE, CAPILLARY
GLUCOSE-CAPILLARY: 169 mg/dL — AB (ref 65–99)
GLUCOSE-CAPILLARY: 177 mg/dL — AB (ref 65–99)
GLUCOSE-CAPILLARY: 180 mg/dL — AB (ref 65–99)
Glucose-Capillary: 158 mg/dL — ABNORMAL HIGH (ref 65–99)
Glucose-Capillary: 165 mg/dL — ABNORMAL HIGH (ref 65–99)
Glucose-Capillary: 151 mg/dL — ABNORMAL HIGH (ref 65–99)
Glucose-Capillary: 134 mg/dL — ABNORMAL HIGH (ref 65–99)

## 2018-04-06 LAB — BASIC METABOLIC PANEL
ANION GAP: 11 (ref 5–15)
BUN: 39 mg/dL — ABNORMAL HIGH (ref 6–20)
CHLORIDE: 96 mmol/L — AB (ref 101–111)
CO2: 42 mmol/L — ABNORMAL HIGH (ref 22–32)
Calcium: 9.2 mg/dL (ref 8.9–10.3)
Creatinine, Ser: 1.46 mg/dL — ABNORMAL HIGH (ref 0.44–1.00)
GFR calc non Af Amer: 34 mL/min — ABNORMAL LOW (ref >60)
GFR, EST AFRICAN AMERICAN: 40 mL/min — AB (ref >60)
Glucose, Bld: 187 mg/dL — ABNORMAL HIGH (ref 65–99)
POTASSIUM: 3.4 mmol/L — AB (ref 3.5–5.1)
SODIUM: 149 mmol/L — AB (ref 135–145)

## 2018-04-06 LAB — POCT I-STAT 3, ART BLOOD GAS (G3+)
ACID-BASE EXCESS: 21 mmol/L — AB (ref 0.0–2.0)
Bicarbonate: 46.8 mmol/L — ABNORMAL HIGH (ref 20.0–28.0)
O2 Saturation: 93 %
PCO2 ART: 57.2 mmHg — AB (ref 32.0–48.0)
TCO2: 49 mmol/L — ABNORMAL HIGH (ref 22–32)
pH, Arterial: 7.521 — ABNORMAL HIGH (ref 7.350–7.450)
pO2, Arterial: 63 mmHg — ABNORMAL LOW (ref 83.0–108.0)

## 2018-04-06 MED ORDER — SENNOSIDES 8.8 MG/5ML PO SYRP
15.0000 mL | ORAL_SOLUTION | Freq: Every day | ORAL | Status: DC
  Filled 2018-04-06: qty 15

## 2018-04-06 MED ORDER — FUROSEMIDE 10 MG/ML IJ SOLN
60.0000 mg | Freq: Two times a day (BID) | INTRAMUSCULAR | Status: DC
  Administered 2018-04-06 – 2018-04-07 (×3): 60 mg via INTRAVENOUS
  Filled 2018-04-06 (×3): qty 6

## 2018-04-06 MED ORDER — FENTANYL CITRATE (PF) 100 MCG/2ML IJ SOLN: 50.0000 ug | INTRAMUSCULAR | Status: DC | PRN

## 2018-04-06 MED ORDER — FENTANYL CITRATE (PF) 100 MCG/2ML IJ SOLN: 25.0000 ug | INTRAMUSCULAR | Status: DC | PRN

## 2018-04-06 MED ORDER — FAMOTIDINE 40 MG/5ML PO SUSR
40.0000 mg | Freq: Every day | ORAL | Status: DC
  Filled 2018-04-06: qty 5

## 2018-04-06 MED ORDER — BISACODYL 10 MG RE SUPP
10.0000 mg | Freq: Every day | RECTAL | Status: DC | PRN
  Administered 2018-04-06: 10 mg via RECTAL
  Filled 2018-04-06: qty 1

## 2018-04-06 MED ORDER — SENNA 8.6 MG PO TABS
1.0000 | ORAL_TABLET | Freq: Every day | ORAL | Status: DC
  Administered 2018-04-06 – 2018-04-11 (×6): 8.6 mg via ORAL
  Filled 2018-04-06 (×7): qty 1

## 2018-04-06 MED ORDER — ASPIRIN 81 MG PO CHEW
81.0000 mg | CHEWABLE_TABLET | Freq: Every day | ORAL | Status: DC
  Administered 2018-04-07 – 2018-04-12 (×6): 81 mg via ORAL
  Filled 2018-04-06 (×6): qty 1

## 2018-04-06 MED ORDER — DEXTROSE 5 % IV SOLN
INTRAVENOUS | Status: DC
  Administered 2018-04-06 – 2018-04-07 (×2): via INTRAVENOUS

## 2018-04-06 MED ORDER — POTASSIUM CHLORIDE 20 MEQ/15ML (10%) PO SOLN
40.0000 meq | Freq: Two times a day (BID) | ORAL | Status: DC
  Administered 2018-04-06: 40 meq
  Filled 2018-04-06: qty 30

## 2018-04-06 MED ORDER — DEXMEDETOMIDINE HCL IN NACL 400 MCG/100ML IV SOLN: 0.0000 ug/kg/h | INTRAVENOUS | Status: DC

## 2018-04-06 MED ORDER — POTASSIUM CHLORIDE 10 MEQ/50ML IV SOLN
10.0000 meq | INTRAVENOUS | Status: AC
  Administered 2018-04-06 (×3): 10 meq via INTRAVENOUS
  Filled 2018-04-06 (×3): qty 50

## 2018-04-06 MED ORDER — POTASSIUM CHLORIDE CRYS ER 20 MEQ PO TBCR
40.0000 meq | EXTENDED_RELEASE_TABLET | Freq: Two times a day (BID) | ORAL | Status: DC
  Administered 2018-04-06 – 2018-04-12 (×12): 40 meq via ORAL
  Filled 2018-04-06 (×13): qty 2

## 2018-04-06 MED ORDER — FAMOTIDINE 20 MG PO TABS
40.0000 mg | ORAL_TABLET | Freq: Every day | ORAL | Status: DC
  Administered 2018-04-06 – 2018-04-11 (×6): 40 mg via ORAL
  Filled 2018-04-06 (×6): qty 2

## 2018-04-06 NOTE — Procedures (Signed)
Extubation Procedure Note  Patient Details:   Name: Heather Zimmerman. [name] DOB: 11-08-1945 MRN: [id number]   Airway Documentation:    Vent end date: 04/06/18 Vent end time: 1500   Evaluation  O2 sats: stable throughout Complications: No apparent complications Patient did tolerate procedure well. Bilateral Breath Sounds: Clear, Diminished   No   Patient extubated to a 4L [place]. Cuff leak was not heard and NP was notified and wanted [place] to proceed with extubation. No stridor was noted. RN at bedside with RT during extubation.  [name] 04/06/2018, 3:14 PM

## 2018-04-06 NOTE — Progress Notes (Signed)
eLink Physician-Brief Progress Note Patient Name: Heather Zimmerman DOB: 1946/09/18 MRN: [id number]   Date of Service  04/06/2018  HPI/Events of Note  K+ = 3.4 and Creatinine = 1.46   eICU Interventions  Will replace K+.     Intervention Category Major Interventions: Electrolyte abnormality - evaluation and management  Sommer,Steven Eugene 04/06/2018, 5:17 AM

## 2018-04-06 NOTE — Progress Notes (Signed)
PULMONARY / CRITICAL CARE MEDICINE   Name: Heather Zimmerman. Bezek MRN: [id number] DOB: 06-15-1946    ADMISSION DATE:  03/31/2018 CONSULTATION DATE:  03/31/18  REFERRING MD:  Vanita Panda  CHIEF COMPLAINT:  Confusion, hypoxemia  HISTORY OF PRESENT ILLNESS:   72 y/o female OSA, CKD, HTN, presented with acute respiratory failure due to CHF exacerbation.    SUBJECTIVE:  She is fully awake, anxious to have the ventilator removed.  In no distress.  Writing notes to staff.  On initial rounds this morning was only pulling 200 cc tidal volume on pressure support of 15  VITAL SIGNS: Blood Pressure (Abnormal) 142/83   Pulse 76   Temperature 98.5 F (36.9 C) (Oral)   Respiration 20   Height 5\' 1"  (1.549 m)   Weight 283 lb 4.7 oz (128.5 kg)   Last Menstrual Period  (LMP Unknown)   Oxygen Saturation 91%   Body Mass Index 53.53 kg/m    HEMODYNAMICS:    VENTILATOR SETTINGS: Vent Mode: PSV;CPAP FiO2 (%):  [40 %] 40 % Set Rate:  [14 bmp] 14 bmp Vt Set:  [420 mL] 420 mL PEEP:  [8 cmH20] 8 cmH20 Pressure Support:  [10 cmH20-15 cmH20] 15 cmH20 Plateau Pressure:  [10 cmH20-20 cmH20] 20 cmH20  INTAKE / OUTPUT:  Intake/Output Summary (Last 24 hours) at 04/06/2018 1137 Last data filed at 04/06/2018 1128 Gross per 24 hour  Intake 3379.2 ml  Output 2982.5 ml  Net 396.7 ml     PHYSICAL EXAMINATION:  General: This is an obese 72 year old African-American female she is resting quietly in bed and in no acute distress. HEENT normocephalic atraumatic orally intubated neck is short Pulmonary scattered rhonchi tidal volume in the 300 range on pressure support of 8 Cardiac: Regular rate and rhythm Abdomen: Soft, obese, positive bowel sounds.  No bowel movement x6 days. Extremities.  Left AKA.  Right diabetic ulcer dressed Neuro: Awake, appropriate, follows commands, no focal deficits.    LABS:  BMET Recent Labs  Lab 04/04/18 0419 04/05/18 0322 04/06/18 0353  NA 150* 149* 149*  K 3.4* 3.3*  3.4*  CL 100* 96* 96*  CO2 41* 45* 42*  BUN 45* 42* 39*  CREATININE 1.76* 1.50* 1.46*  GLUCOSE 140* 188* 187*    Electrolytes Recent Labs  Lab 04/02/18 1654 04/03/18 0540 04/04/18 0419 04/05/18 0322 04/06/18 0353  CALCIUM  --  8.5* 8.8* 9.1 9.2  MG 2.0 2.2  --  1.8  --   PHOS 3.8 4.0  --  3.1  --     CBC Recent Labs  Lab 04/03/18 0540 04/04/18 0419 04/05/18 0322  WBC 5.8 5.2 5.5  HGB 9.2* 10.0* 10.3*  HCT 32.3* 35.6* 36.3  PLT 204 206 216    Coag's No results for input(s): APTT, INR in the last 168 hours.  Sepsis Markers Recent Labs  Lab 04/01/18 1201 04/02/18 0629 04/03/18 0540  PROCALCITON 0.19 0.18 0.17    ABG Recent Labs  Lab 03/31/18 1525 03/31/18 1738 03/31/18 2109  PHART 7.174* 7.188* 7.305*  PCO2ART 102* 95.8* 68.1*  PO2ART 98.6 74.0* 61.0*    Liver Enzymes Recent Labs  Lab 03/31/18 1305  AST 14*  ALT 11*  ALKPHOS 63  BILITOT 0.9  ALBUMIN 3.0*    Cardiac Enzymes Recent Labs  Lab 03/31/18 1305  TROPONINI <0.03    Glucose Recent Labs  Lab 04/05/18 1648 04/05/18 1920 04/06/18 0011 04/06/18 0407 04/06/18 0713 04/06/18 1105  GLUCAP 129* 153* 169* 158* 165* 180*  Imaging Dg Chest Port 1 View  Result Date: 04/06/2018 CLINICAL DATA:  Acute respiratory failure.  Hypoxemia. EXAM: PORTABLE CHEST 1 VIEW COMPARISON:  April 05, 2018 FINDINGS: The ETT is in good position. The NG tube terminates below today's film. A left central line is in stable position. No pneumothorax. Opacity in left base is more pronounced in the interval. Mild opacity in the right base is also more pronounced. IMPRESSION: 1. Support apparatus as above. 2. Bibasilar opacities, worsened in the interval. Electronically Signed   By: Dorise Bullion III M.D   On: 04/06/2018 07:21     STUDIES:  CXR 6/21 > continued mild interstitial edema. 2D echo 04/01/2018: EF 60 to 76% grade 1 diastolic dysfunction right ventricle systolic function was mildly  reduced.  CULTURES: Blood 6/18 > gram-positive cocci/MSSA (X2) Sputum 6/18 > not done Blood 6/21>>  ANTIBIOTICS: Vanc 6/18 > 04/01/2018 Zosyn 6/18 > 04/01/2018 Ancef 04/01/2018>>  SIGNIFICANT EVENTS: 6/18  Admit with AMS, hypercarbia   LINES/TUBES: 03/31/2018 endotracheal tube>>    DISCUSSION: 72 y/o female with acute respiratory failure with hypoxemia due to a CHF Exacerbaiton. She has severe OSA at baseline.   ASSESSMENT / PLAN:  Resolved issues:  Acute respiratory failure with hypoxemia in setting of acute pulmonary edema OSA -,Portable chest x-ray personally reviewed this demonstrates the endotracheal tube in satisfactory position, marked cardiomegaly.  Bilateral basilar volume loss with some worsening on left compared to right.  Also appears to be element of edema -7 L negative since admission -Her frequency tidal volume ratio is just under 120 even on pressure support of 8 with PEEP of 8.  I think  should she will be borderline at best, and absolutely need BiPAP postextubation -Constipation may be contributing to poor ventilator mechanics Plan Continuing daily spontaneous weaning trials; getting ABG Keep PEEP at 8  Continue diuresis Limit narcotics Treat constipation Continue bronchodilators Will need BiPAP post extubation PAD protocol, stopping fentanyl and propofol infusion changing fentanyl to PRN; if frequent PRN as needed will add Precedex  Acute on chronic diastolic heart failure, evidence of cor pulmonale with mildly reduced right ventricular function Plan Continue telemetry monitoring Continue diuresis  Coag negative staph bacteremia -Etiology unclear; had 2 out of 2 cultures positive drawn from left and right antecubitals.  She does have diabetic ulcer on her lower extremity this may be the culprit  plan Follow-up repeat blood cultures drawn 6/21 Day number 7 antibiotics, day #6 Ancef; will discontinue if follow-up cultures negative  Acute on  chronic kidney disease: Creatinine improved Plan Avoid hypotension  Adjust medications renally  Fluid and electrolyte imbalance:Hypernatremia and hypokalemia Plan Add D5W Replace potassium Repeat a.m. chemistry   Morbid obesity @ risk for malnutrition in setting of critical illness Plan Tube feeds  Anemia without bleeding Plan Trend CBC transfuse for hemoglobin less than 7     DM2 with hyperglycemia -Remains hyperglycemic Plan Sliding-scale insulin   DVT prophylaxis: [place] heparin  SUP: H2B Diet: tubefeed Activity: OOB Disposition : ICU '  FAMILY  - Updates: none bedside  - Inter-disciplinary family meet or Palliative Care meeting due by:  Day [place] ACNP-BC [place] Pager # [phone] OR # [phone] if no answer  04/06/2018, 11:20 AM

## 2018-04-07 ENCOUNTER — Inpatient Hospital Stay (HOSPITAL_COMMUNITY): Payer: Medicare Other

## 2018-04-07 LAB — COMPREHENSIVE METABOLIC PANEL
ALBUMIN: 2.5 g/dL — AB (ref 3.5–5.0)
ALT: <5 U/L (ref 0–44)
ANION GAP: 9 (ref 5–15)
AST: 17 U/L (ref 15–41)
Alkaline Phosphatase: 40 U/L (ref 38–126)
BUN: 32 mg/dL — ABNORMAL HIGH (ref 8–23)
CO2: 40 mmol/L — ABNORMAL HIGH (ref 22–32)
CREATININE: 1.4 mg/dL — AB (ref 0.44–1.00)
Calcium: 9.2 mg/dL (ref 8.9–10.3)
Chloride: 96 mmol/L — ABNORMAL LOW (ref 98–111)
GFR, EST AFRICAN AMERICAN: 42 mL/min — AB (ref >60)
GFR, EST NON AFRICAN AMERICAN: 36 mL/min — AB (ref >60)
Glucose, Bld: 152 mg/dL — ABNORMAL HIGH (ref 70–99)
Potassium: 3.8 mmol/L (ref 3.5–5.1)
SODIUM: 145 mmol/L (ref 135–145)
TOTAL PROTEIN: 7.5 g/dL (ref 6.5–8.1)
Total Bilirubin: 0.7 mg/dL (ref 0.3–1.2)

## 2018-04-07 LAB — GLUCOSE, CAPILLARY
GLUCOSE-CAPILLARY: 145 mg/dL — AB (ref 70–99)
GLUCOSE-CAPILLARY: 176 mg/dL — AB (ref 70–99)
GLUCOSE-CAPILLARY: 208 mg/dL — AB (ref 70–99)
Glucose-Capillary: 126 mg/dL — ABNORMAL HIGH (ref 70–99)
Glucose-Capillary: 127 mg/dL — ABNORMAL HIGH (ref 70–99)
Glucose-Capillary: 205 mg/dL — ABNORMAL HIGH (ref 70–99)

## 2018-04-07 LAB — CBC
HCT: 38.4 % (ref 36.0–46.0)
Hemoglobin: 10.7 g/dL — ABNORMAL LOW (ref 12.0–15.0)
MCH: 24.5 pg — ABNORMAL LOW (ref 26.0–34.0)
MCHC: 27.9 g/dL — ABNORMAL LOW (ref 30.0–36.0)
MCV: 88.1 fL (ref 78.0–100.0)
PLATELETS: 222 10*3/uL (ref 150–400)
RBC: 4.36 MIL/uL (ref 3.87–5.11)
RDW: 19.5 % — AB (ref 11.5–15.5)
WBC: 6.1 10*3/uL (ref 4.0–10.5)

## 2018-04-07 MED ORDER — INSULIN ASPART 100 UNIT/ML ~~LOC~~ SOLN
0.0000 [IU] | Freq: Two times a day (BID) | SUBCUTANEOUS | Status: DC
  Administered 2018-04-07: 4 [IU] via SUBCUTANEOUS
  Administered 2018-04-07: 7 [IU] via SUBCUTANEOUS
  Administered 2018-04-08: 3 [IU] via SUBCUTANEOUS
  Administered 2018-04-08: 4 [IU] via SUBCUTANEOUS
  Administered 2018-04-09: 3 [IU] via SUBCUTANEOUS
  Administered 2018-04-09 – 2018-04-10 (×2): 7 [IU] via SUBCUTANEOUS
  Administered 2018-04-10: 4 [IU] via SUBCUTANEOUS
  Administered 2018-04-11: 7 [IU] via SUBCUTANEOUS
  Administered 2018-04-11: 4 [IU] via SUBCUTANEOUS

## 2018-04-07 MED ORDER — GLUCERNA SHAKE PO LIQD
237.0000 mL | Freq: Three times a day (TID) | ORAL | Status: DC
  Administered 2018-04-07 – 2018-04-12 (×11): 237 mL via ORAL
  Filled 2018-04-07 (×3): qty 237

## 2018-04-07 MED ORDER — AMLODIPINE BESYLATE 5 MG PO TABS
5.0000 mg | ORAL_TABLET | Freq: Every day | ORAL | Status: DC
  Administered 2018-04-07 – 2018-04-12 (×5): 5 mg via ORAL
  Filled 2018-04-07 (×6): qty 1

## 2018-04-07 MED ORDER — DULOXETINE HCL 30 MG PO CPEP
30.0000 mg | ORAL_CAPSULE | Freq: Two times a day (BID) | ORAL | Status: DC
  Administered 2018-04-07 – 2018-04-12 (×10): 30 mg via ORAL
  Filled 2018-04-07 (×10): qty 1

## 2018-04-07 NOTE — Progress Notes (Addendum)
PULMONARY / CRITICAL CARE MEDICINE   Name: Heather Zimmerman. [place] MRN: [id number] DOB: 1946/10/12    ADMISSION DATE:  03/31/2018 CONSULTATION DATE:  03/31/18  REFERRING MD:  Vanita Panda  CHIEF COMPLAINT:  Confusion, hypoxemia  HISTORY OF PRESENT ILLNESS:   72 y/o female OSA, CKD, HTN, presented with acute respiratory failure due to CHF exacerbation.    SUBJECTIVE:  Extubated successfully on 6/24.  No acute distress.  VITAL SIGNS: Blood Pressure 117/67 (BP Location: Left Arm)   Pulse 75   Temperature 98.1 F (36.7 C) (Oral)   Respiration 18   Height 5\' 1"  (1.549 m)   Weight 281 lb 15.5 oz (127.9 kg)   Last Menstrual Period  (LMP Unknown)   Oxygen Saturation 97%   Body Mass Index 53.28 kg/m   HEMODYNAMICS:    VENTILATOR SETTINGS: Vent Mode: PSV;CPAP FiO2 (%):  [40 %] 40 % PEEP:  [8 cmH20] 8 cmH20 Pressure Support:  [8 cmH20-15 cmH20] 8 cmH20  INTAKE / OUTPUT:  Intake/Output Summary (Last 24 hours) at 04/07/2018 3875 Last data filed at 04/07/2018 0800 Gross per 24 hour  Intake 2457.17 ml  Output 3380 ml  Net -922.83 ml     PHYSICAL EXAMINATION:  General: This is a 72 year old African-American female she is currently resting in bed she is in no acute distress HEENT normocephalic atraumatic mucous membranes moist no jugular venous distention Pulmonary: Diminished throughout, no accessory use. Cardiac: Regular rate and rhythm Abdomen: Soft, nontender. Extremities: Left lower extremity wrapped, right AKA.  Dry flaky skin. Neuro: Awake, oriented, no focal deficits.    LABS:  BMET Recent Labs  Lab 04/05/18 0322 04/06/18 0353 04/07/18 0351  NA 149* 149* 145  K 3.3* 3.4* 3.8  CL 96* 96* 96*  CO2 45* 42* 40*  BUN 42* 39* 32*  CREATININE 1.50* 1.46* 1.40*  GLUCOSE 188* 187* 152*    Electrolytes Recent Labs  Lab 04/02/18 1654 04/03/18 0540  04/05/18 0322 04/06/18 0353 04/07/18 0351  CALCIUM  --  8.5*   < > 9.1 9.2 9.2  MG 2.0 2.2  --  1.8  --   --   PHOS  3.8 4.0  --  3.1  --   --    < > = values in this interval not displayed.    CBC Recent Labs  Lab 04/04/18 0419 04/05/18 0322 04/07/18 0351  WBC 5.2 5.5 6.1  HGB 10.0* 10.3* 10.7*  HCT 35.6* 36.3 38.4  PLT 206 216 222    Coag's No results for input(s): APTT, INR in the last 168 hours.  Sepsis Markers Recent Labs  Lab 04/01/18 1201 04/02/18 0629 04/03/18 0540  PROCALCITON 0.19 0.18 0.17    ABG Recent Labs  Lab 03/31/18 1738 03/31/18 2109 04/06/18 1309  PHART 7.188* 7.305* 7.521*  PCO2ART 95.8* 68.1* 57.2*  PO2ART 74.0* 61.0* 63.0*    Liver Enzymes Recent Labs  Lab 03/31/18 1305 04/07/18 0351  AST 14* 17  ALT 11* <5  ALKPHOS 63 40  BILITOT 0.9 0.7  ALBUMIN 3.0* 2.5*    Cardiac Enzymes Recent Labs  Lab 03/31/18 1305  TROPONINI <0.03    Glucose Recent Labs  Lab 04/06/18 1609 04/06/18 1941 04/06/18 2314 04/07/18 0106 04/07/18 0329 04/07/18 0713  GLUCAP 177* 151* 134* 126* 145* 127*    Imaging Dg Chest Port 1 View  Result Date: 04/07/2018 CLINICAL DATA:  Shortness of breath EXAM: PORTABLE CHEST 1 VIEW COMPARISON:  04/06/2018 FINDINGS: Cardiac shadow is enlarged but stable. Endotracheal tube and nasogastric  catheter have been removed in the interval. Left jugular central line is again seen and stable. The lungs are well aerated bilaterally. Significant improved aeration in the bases is noted. No bony abnormality is seen. IMPRESSION: Improved aeration from the prior exam. Electronically Signed   By: Inez Catalina M.D.   On: 04/07/2018 07:50     STUDIES:  CXR 6/21 > continued mild interstitial edema. 2D echo 04/01/2018: EF 60 to 16% grade 1 diastolic dysfunction right ventricle systolic function was mildly reduced.  CULTURES: Blood 6/18 > gram-positive cocci/MSSA (X2) Sputum 6/18 > not done Blood 6/21>>  ANTIBIOTICS: Vanc 6/18 > 04/01/2018 Zosyn 6/18 > 04/01/2018 Ancef 04/01/2018>>  SIGNIFICANT EVENTS: 6/18  Admit with AMS, hypercarbia    LINES/TUBES: 03/31/2018 endotracheal tube>> 6/24    DISCUSSION: 72 y/o female with acute respiratory failure with hypoxemia due to a CHF Exacerbaiton. She has severe OSA at baseline.  Clinically improved.  Ready to discharge out of the intensive care to the progressive care unit.  Primary issue at this point will be to determine whether or not she has CPAP or BiPAP at home, and to ensure adequate supportive therapy following discharge  ASSESSMENT / PLAN:  Resolved issues:  Acute respiratory failure with hypoxemia in setting of acute pulmonary edema OSA Verbal chest x-ray personally reviewed: Bibasilar atelectasis.  Improved aeration bilaterally. Successfully extubated on 6/24 Currently 8.5 L negative Tolerated at bedtime BiPAP well.  Currently at 6 L nasal cannula, her baseline is 2 L There is conflicting data in the medical records as to whether or not she is on CPAP or BiPAP at home, however which ever mode she has been noncompliant with this Plan Out of bed daily Continue diuresis Continue at bedtime BiPAP  we will ask case manager to contact DME provider to determine if she is indeed on BiPAP at SNF If she is on CPAP at SNF we need to transition her to CPAP in the next day or so, and get arterial blood gas after night of CPAP to determine if she needs CPAP versus BiPAP Limit narcotics Continue bronchodilators   Acute on chronic diastolic heart failure, evidence of cor pulmonale with mildly reduced right ventricular function Plan Continue telemetry Continue diuresis Add back norvasc  Coag negative staph bacteremia -Etiology unclear; had 2 out of 2 cultures positive drawn from left and right antecubitals.  She does have diabetic ulcer on her lower extremity this may be the culprit  plan DC Ancef, now completed 8 days antibiotics  Acute on chronic kidney disease: Creatinine improved Plan Avoid hypotension Renal dose medications  Fluid and electrolyte  imbalance:Hypernatremia and hypokalemia Plan Encourage p.o. intake, thirst should continue to regulate water balance KVO D5 W Empiric potassium replacement  Morbid obesity @ risk for malnutrition in setting of critical illness Plan Aspiration precautions Advance diet  Anemia without bleeding Plan Trend CBC Transfuse for hemoglobin less than 7     DM2 with hyperglycemia -Remains hyperglycemic Plan Sliding scale insulin   DVT prophylaxis: [place] heparin  SUP: H2B Diet: tubefeed; advance diet Activity: OOB Disposition : ICU; progressive care effective 6/25 we will ask [facility] to assume primary care.  FAMILY  - Updates: none bedside  - Inter-disciplinary family meet or Palliative Care meeting due by:  Day [place] ACNP-BC [place] Pager # [phone] OR # [phone] if no answer  04/07/2018, 8:23 AM

## 2018-04-07 NOTE — Progress Notes (Signed)
Nutrition Follow-up  DOCUMENTATION CODES:   Morbid obesity  INTERVENTION:   - Glucerna Shake po TID, each supplement provides 220 kcal and 10 grams of protein  - Encourage PO intake  NUTRITION DIAGNOSIS:   Inadequate oral intake related to inability to eat as evidenced by NPO status.  Progressing as pt is now on Heart Healthy/Carb Modified diet  GOAL:   Patient will meet greater than or equal to 90% of their needs  Improving, being addressed via oral nutrition supplements  MONITOR:   PO intake, Weight trends, I & O's, Labs, Supplement acceptance, Skin  REASON FOR ASSESSMENT:   Ventilator, Consult Enteral/tube feeding initiation and management  ASSESSMENT:   72 yo female with PMH of DM2, HTN, CHF, GERD, vitamin D deficiency, venous stasis ulcers, R BKA, OSA, CKD who was admitted on 6/18 with SOB and respiratory distress requiring intubation.  6/24 - pt extubated 6/25 - diet advanced  Spoke with pt at beside. Pt states that "this is my first meal." Pt reports not being very hungry. Pt also states that the "water tastes different today."  Spoke with RN who reports pt requested cereal for breakfast instead of what was brought on her tray. RD noted pt completed 100% of cereal and milk and did not consume more than a few bites of the other food items on her tray. RD to order Glucerna oral nutrition supplement to help pt meet kcal and protein needs as her appetite returns. Pt agreeable.  Medications reviewed and include: 40 mg Pepcid daily, 60 mg Lasix BID, sliding scale Novolog, 40 mEq K-dur BID, Senokot daily, D5 @ 10 ml/hr  Labs reviewed: CO2 40 (H), BUN 32 (H), creatinine 1.40 (H), hemoglobin 10.7 (L) CBG's: 127, 145, 126, 134, 151, 177, 180 x 24 hours  UOP: 3380 ml x 24 hours I/O's: -8.5 L since admission   Diet Order:   Diet Order           Diet heart healthy/carb modified Room service appropriate? Yes; Fluid consistency: Thin  Diet effective now           EDUCATION NEEDS:   No education needs have been identified at this time  Skin:  Skin Assessment: Skin Integrity Issues: Skin Integrity Issues:: Other (Comment) Other: open wound to L leg  Last BM:  04/06/18 large type 7  Height:   Ht Readings from Last 1 Encounters:  03/31/18 5\' 1"  (1.549 m)    Weight:   Wt Readings from Last 1 Encounters:  04/07/18 281 lb 15.5 oz (127.9 kg)    Ideal Body Weight:  43.9 kg  BMI:  Body mass index is 53.28 kg/m.  Estimated Nutritional Needs:   Kcal:  1500-1700 kcal/day  Protein:  75-90 grams/day  Fluid:  1.5 L    Gaynell Face, MS, RD, LDN Pager: [phone] Weekend/After Hours: [phone]

## 2018-04-07 NOTE — Care Management (Signed)
CSW will follow up with SNF to determine  if facility provides BIPAP or CPAP.  No CM needs determined at this time

## 2018-04-07 NOTE — Evaluation (Signed)
Physical Therapy Evaluation Patient Details Name: Heather Zimmerman. Hanselman MRN: [id number] DOB: 1946-04-16 Today's Date: 04/07/2018   History of Present Illness  72 y/o female OSA, CKD, HTN, presented with acute respiratory failure due to CHF exacerbation.    Clinical Impression  Pt admitted with above diagnosis. Pt currently with functional limitations due to the deficits listed below (see PT Problem List). Pt functioning near baseline. Pt did have complaint of abdominal pain with transfer to EOB. Pt report she doesn't want to return to [facility]. Pt will benefit from skilled PT to increase their independence and safety with mobility to allow discharge to the venue listed below.       Follow Up Recommendations LTACH;Supervision/Assistance - 24 hour(doesn't want to return to [facility])    Equipment Recommendations  None recommended by PT    Recommendations for Other Services       Precautions / Restrictions Precautions Precautions: Fall Precaution Comments: R BKA Restrictions Weight Bearing Restrictions: No      Mobility  Bed Mobility Overal bed mobility: Needs Assistance Bed Mobility: Supine to Sit;Sit to Supine     Supine to sit: Mod assist Sit to supine: Min assist   General bed mobility comments: pt able to pull self up to [facility] using bed rails and pushing with L LE. due to body habitus modA for trunk elevation and to square self up at EOB  Transfers                 General transfer comment: didn't attempt, hoyer lift at facility  Ambulation/Gait             General Gait Details: pt non-ambulatory  Stairs            Wheelchair Mobility    Modified Rankin (Stroke Patients Only)       Balance Overall balance assessment: Needs assistance Sitting-balance support: No upper extremity supported;Single extremity supported Sitting balance-Leahy Scale: Fair Sitting balance - Comments: able to complete bilat LE ther ex at EOB                                     Pertinent Vitals/Pain Pain Assessment: No/denies pain    Home Living Family/patient expects to be discharged to:: Skilled nursing facility                 Additional Comments: resident of [facility](desires to go to another facility)    Prior Function Level of Independence: Needs assistance   Gait / Transfers Assistance Needed: non ambulatory  ADL's / Homemaking Assistance Needed: assist from SNF staff        Hand Dominance   Dominant Hand: Left    Extremity/Trunk Assessment   Upper Extremity Assessment Upper Extremity Assessment: Generalized weakness    Lower Extremity Assessment Lower Extremity Assessment: RLE deficits/detail;LLE deficits/detail RLE Deficits / Details: R BKA, pt able to move LE to EOB LLE Deficits / Details: grossly 3-/5    Cervical / Trunk Assessment Cervical / Trunk Assessment: Kyphotic  Communication   Communication: (muffled speech)  Cognition Arousal/Alertness: Awake/alert Behavior During Therapy: WFL for tasks assessed/performed Overall Cognitive Status: Within Functional Limits for tasks assessed                                        General Comments General comments (  skin integrity, edema, etc.): pt with discolored L lower extremity and dressing on     Exercises General Exercises - Lower Extremity Ankle Circles/Pumps: AROM;Left;10 reps;Seated Long Arc Quad: AROM;Both;10 reps;Seated Hip ABduction/ADduction: AROM;Both;10 reps;Seated Hip Flexion/Marching: AROM;Both;10 reps;Seated   Assessment/Plan    PT Assessment Patient needs continued PT services  PT Problem List Decreased strength;Decreased range of motion;Decreased activity tolerance;Decreased balance;Decreased mobility;Decreased coordination;Decreased knowledge of use of DME       PT Treatment Interventions DME instruction;Gait training;Functional mobility training;Therapeutic activities;Therapeutic exercise;Balance  training;Neuromuscular re-education    PT Goals (Current goals can be found in the Care Plan section)  Acute Rehab PT Goals Patient Stated Goal: go to another rehab PT Goal Formulation: With patient Time For Goal Achievement: 04/21/18 Potential to Achieve Goals: Good    Frequency Min 2X/week   Barriers to discharge   doesn't want to return to [facility] "6 Clicks" Daily Activity  Outcome Measure Difficulty turning over in bed (including adjusting bedclothes, sheets and blankets)?: Unable Difficulty moving from lying on back to sitting on the side of the bed? : Unable Difficulty sitting down on and standing up from a chair with arms (e.g., wheelchair, bedside commode, etc,.)?: Unable Help needed moving to and from a bed to chair (including a wheelchair)?: Total Help needed walking in hospital room?: Total Help needed climbing 3-5 steps with a railing? : Total 6 Click Score: 6    End of Session Equipment Utilized During Treatment: Oxygen Activity Tolerance: Patient tolerated treatment well Patient left: in bed;with call bell/phone within reach;with bed alarm set Nurse Communication: Mobility status PT Visit Diagnosis: Unsteadiness on feet (R26.81);Muscle weakness (generalized) (M62.81)    Time: 0258-5277 PT Time Calculation (min) (ACUTE ONLY): 22 min   Charges:   PT Evaluation $PT Eval Moderate Complexity: 1 Mod     PT G Codes:        Kittie Plater, PT, DPT Pager #: [phone] Office #: [phone]   Cadience Bradfield M Elieser Tetrick 04/07/2018, 2:31 PM

## 2018-04-07 NOTE — Progress Notes (Signed)
Pt received from 2MW per bed.

## 2018-04-07 NOTE — Progress Notes (Signed)
Attempted report x2 to [employer]

## 2018-04-07 NOTE — Progress Notes (Signed)
Sheleigh,RN is aware of the central line removal order and will remove the central line.  Therese Sarah

## 2018-04-08 ENCOUNTER — Other Ambulatory Visit: Payer: Self-pay

## 2018-04-08 ENCOUNTER — Encounter (HOSPITAL_COMMUNITY): Payer: Self-pay | Admitting: General Practice

## 2018-04-08 DIAGNOSIS — J811 Chronic pulmonary edema: Secondary | ICD-10-CM

## 2018-04-08 LAB — BASIC METABOLIC PANEL
Anion gap: 9 (ref 5–15)
BUN: 30 mg/dL — AB (ref 8–23)
CHLORIDE: 96 mmol/L — AB (ref 98–111)
CO2: 38 mmol/L — AB (ref 22–32)
CREATININE: 1.4 mg/dL — AB (ref 0.44–1.00)
Calcium: 9.2 mg/dL (ref 8.9–10.3)
GFR calc Af Amer: 42 mL/min — ABNORMAL LOW (ref >60)
GFR calc non Af Amer: 36 mL/min — ABNORMAL LOW (ref >60)
GLUCOSE: 183 mg/dL — AB (ref 70–99)
Potassium: 4.4 mmol/L (ref 3.5–5.1)
Sodium: 143 mmol/L (ref 135–145)

## 2018-04-08 LAB — CBC
HCT: 38.2 % (ref 36.0–46.0)
Hemoglobin: 10.8 g/dL — ABNORMAL LOW (ref 12.0–15.0)
MCH: 24.9 pg — AB (ref 26.0–34.0)
MCHC: 28.3 g/dL — AB (ref 30.0–36.0)
MCV: 88 fL (ref 78.0–100.0)
Platelets: 235 10*3/uL (ref 150–400)
RBC: 4.34 MIL/uL (ref 3.87–5.11)
RDW: 19.2 % — AB (ref 11.5–15.5)
WBC: 5.9 10*3/uL (ref 4.0–10.5)

## 2018-04-08 LAB — GLUCOSE, CAPILLARY
GLUCOSE-CAPILLARY: 150 mg/dL — AB (ref 70–99)
GLUCOSE-CAPILLARY: 168 mg/dL — AB (ref 70–99)
Glucose-Capillary: 150 mg/dL — ABNORMAL HIGH (ref 70–99)
Glucose-Capillary: 178 mg/dL — ABNORMAL HIGH (ref 70–99)
Glucose-Capillary: 186 mg/dL — ABNORMAL HIGH (ref 70–99)
Glucose-Capillary: 199 mg/dL — ABNORMAL HIGH (ref 70–99)

## 2018-04-08 LAB — CULTURE, BLOOD (ROUTINE X 2)
Culture: NO GROWTH
Culture: NO GROWTH
SPECIAL REQUESTS: ADEQUATE
SPECIAL REQUESTS: ADEQUATE

## 2018-04-08 MED ORDER — FUROSEMIDE 10 MG/ML IJ SOLN
80.0000 mg | Freq: Two times a day (BID) | INTRAMUSCULAR | Status: DC
  Administered 2018-04-08 – 2018-04-12 (×9): 80 mg via INTRAVENOUS
  Filled 2018-04-08 (×9): qty 8

## 2018-04-08 MED ORDER — ACETAMINOPHEN 325 MG PO TABS
650.0000 mg | ORAL_TABLET | Freq: Four times a day (QID) | ORAL | Status: DC | PRN
  Administered 2018-04-08 – 2018-04-12 (×6): 650 mg via ORAL
  Filled 2018-04-08 (×6): qty 2

## 2018-04-08 NOTE — NC FL2 (Signed)
[place] LEVEL OF CARE SCREENING TOOL     IDENTIFICATION  Patient Name: Heather Zimmerman Birthdate: Dec 16, 1945 Sex: female Admission Date (Current Location): 03/31/2018  [facility] and [place] Number:  Herbalist and Address:  [facility] [place], [place] [address], [place], [place] [address]      Provider Number: [id number]  Attending Physician Name and Address:  Reyne Dumas, MD  Relative Name and Phone Number:  Kalman Shan sister, [phone]    Current Level of Care: Hospital Recommended Level of Care: [place] Prior Approval Number:    Date Approved/Denied:   PASRR Number: [id number]  Discharge Plan: SNF    Current Diagnoses: Patient Active Problem List   Diagnosis Date Noted  . Pulmonary edema   . Acute respiratory failure with hypoxia and hypercapnia ([place]) 03/31/2018  . Endotracheal tube present   . Sepsis secondary to UTI ([place]) 04/29/2017  . Acute on chronic respiratory failure with hypoxia and hypercapnia ([place]) 04/29/2017  . Acute metabolic encephalopathy 03/88/8280  . Dyslipidemia associated with type 2 diabetes mellitus ([place]) 03/03/2017  . Positive D dimer   . Altered mental status 12/28/2016  . Acute renal failure superimposed on stage 3 chronic kidney disease ([place])   . Cellulitis of left thigh 09/23/2016  . UTI (urinary tract infection) 09/23/2016  . Oropharyngeal dysphagia   . Coag negative Staphylococcus bacteremia   . Chronic venous stasis dermatitis   . Weakness of right upper extremity 07/09/2016  . Type II diabetes mellitus with neurological manifestations, uncontrolled ([place]) 04/23/2016  . Ventral hernia without obstruction or gangrene 02/24/2016  . Chronic respiratory failure with hypercapnia ([place]) 02/24/2016  . Occult blood in stools 12/26/2015  . Hypoxemia   . AKI (acute kidney injury) ([place])   . Hypertensive heart disease with congestive heart failure and stage 3 kidney disease ([place]) 07/24/2015   . CKD (chronic kidney disease) stage 3, GFR 30-59 ml/min (HCC) 07/21/2015  . Cellulitis of left lower extremity 07/19/2015  . Acute encephalopathy 07/18/2015  . Open wound of left lower extremity 07/18/2015  . Sepsis ([place]) 07/18/2015  . Venous ulcer of left leg ([place]) 12/27/2014  . Hx of right BKA ([place]) 11/22/2014  . Peripheral autonomic neuropathy due to diabetes mellitus ([place]) 10/09/2014  . OSA treated with BiPAP 07/05/2014  . Acute on chronic respiratory failure with hypercapnia ([place]) 06/21/2014  . Morbid obesity ([place]) 08/30/2013  . Unspecified vitamin D deficiency 12/31/2012  . Disorder of magnesium metabolism 12/31/2012  . Depression 12/31/2012  . Chronic pain 12/31/2012  . Chronic diastolic heart failure ([place]) 12/31/2012  . GERD 12/31/2012    Orientation RESPIRATION BLADDER Height & Weight     Self, Time, Place  O2(Nasal cannula 4L) Continent, Indwelling catheter Weight: 115.9 kg (255 lb 8.2 oz) Height:  5\' 1"  (154.9 cm)  BEHAVIORAL SYMPTOMS/MOOD NEUROLOGICAL BOWEL NUTRITION STATUS      Continent Diet(Please see DC Summary)  AMBULATORY STATUS COMMUNICATION OF NEEDS Skin   Extensive Assist Verbally Other (Comment)(Wound on leg)                       Personal Care Assistance Level of Assistance  Bathing, Feeding, Dressing Bathing Assistance: Maximum assistance Feeding assistance: Maximum assistance Dressing Assistance: Maximum assistance     Functional Limitations Info  Sight, Hearing, Speech Sight Info: Adequate Hearing Info: Adequate Speech Info: Adequate    SPECIAL CARE FACTORS FREQUENCY  Contractures      Additional Factors Info  Code Status, Allergies, Isolation Precautions Code Status Info: Full Allergies Info: Ace Inhibitors     Isolation Precautions Info: MRSA     Current Medications (04/08/2018):  This is the current hospital active medication list Current Facility-Administered Medications  Medication Dose Route  Frequency Provider Last Rate Last Dose  . amLODipine (NORVASC) tablet 5 mg  5 mg Oral Daily Erick Colace, NP   5 mg at 04/08/18 1020  . arformoterol (BROVANA) nebulizer solution 15 mcg  15 mcg Nebulization BID Erick Colace, NP   15 mcg at 04/08/18 0905  . aspirin chewable tablet 81 mg  81 mg Oral Daily Erick Colace, NP   81 mg at 04/08/18 1019  . bisacodyl (DULCOLAX) suppository 10 mg  10 mg Rectal Daily PRN Erick Colace, NP   10 mg at 04/06/18 1244  . budesonide (PULMICORT) nebulizer solution 0.5 mg  0.5 mg Nebulization BID Erick Colace, NP   0.5 mg at 04/08/18 0906  . dextrose 5 % solution   Intravenous Continuous Erick Colace, NP   Stopped at 04/07/18 1338  . DULoxetine (CYMBALTA) DR capsule 30 mg  30 mg Oral BID Erick Colace, NP   30 mg at 04/08/18 1020  . famotidine (PEPCID) tablet 40 mg  40 mg Oral QHS Erick Colace, NP   40 mg at 04/07/18 2254  . feeding supplement (GLUCERNA SHAKE) (GLUCERNA SHAKE) liquid 237 mL  237 mL Oral TID WC Agarwala, Ravi, MD   237 mL at 04/08/18 0845  . furosemide (LASIX) injection 80 mg  80 mg Intravenous Q12H Reyne Dumas, MD   80 mg at 04/08/18 1018  . heparin injection 5,000 Units  5,000 Units Subcutaneous Q8H Erick Colace, NP   5,000 Units at 04/08/18 1321  . insulin aspart (novoLOG) injection 0-20 Units  0-20 Units Subcutaneous BID AC Erick Colace, NP   3 Units at 04/08/18 1320  . ipratropium-albuterol (DUONEB) 0.5-2.5 (3) MG/3ML nebulizer solution 3 mL  3 mL Nebulization Q6H Erick Colace, NP   3 mL at 04/08/18 1516  . potassium chloride SA (K-DUR,KLOR-CON) CR tablet 40 mEq  40 mEq Oral BID Erick Colace, NP   40 mEq at 04/08/18 1020  . senna (SENOKOT) tablet 8.6 mg  1 tablet Oral QHS Erick Colace, NP   8.6 mg at 04/07/18 2254     Discharge Medications: Please see discharge summary for a list of discharge medications.  Relevant Imaging Results:  Relevant Lab Results:   Additional Information SS#:  [id number]  Benard Halsted, LCSWA

## 2018-04-08 NOTE — Progress Notes (Signed)
[facility] PROGRESS NOTE  Heather Zimmerman. Heather Zimmerman BHA:[id number] DOB: 1945/10/27 DOA: 03/31/2018   PCP: Patient, No Pcp Per     Assessment/Plan: Active Problems:   Acute respiratory failure with hypoxia and hypercapnia (HCC)   Endotracheal tube present   72 y/o female OSA, CKD, HTN, presented with acute respiratory failure due to CHF exacerbation on 6/18.   initially unresponsive managed on BiPAP, prior history of severe OSA and multiple admissions, She was reportedly seen early am and in usual state of health.  She apparently went to speech therapy and after completion, she was noted to be dyspneic, non-verbal and altered.  She was put on a NRB for hypoxia.  Sats were in the 60's. The patient was given narcan without significant response.  She was transported to ED where she remained altered and hypoxic.  On admission found to be hypercarbic with a pH of 7.17, PCO2 greater than 100.  Initial chest x-ray showed CHF with pulmonary interstitial and early alveolar edema. Also deemed to be septic and started on vancomycin and Zosyn. Patient subsequently intubated, extubated 6/24, transferred to [facility] 6/25. Currently on 6 L of oxygen . Blood culture on 6/18 positive for gram-positive cocci/MSSA (X2). Currently on Ancef.  Assessment and plan  Acute respiratory failure with hypoxemia in setting of acute pulmonary edema/acute on chronic diastolic heart failure OSA Chest x-ray shows improvement Successfully extubated on 6/24 Currently 8.5 L negative Tolerated at bedtime BiPAP well.  Currently at 6 L nasal cannula, her baseline is 2 L There is conflicting data in the medical records as to whether or not she is on CPAP or BiPAP at home, however which ever mode she has been noncompliant with this Patient has been evaluated by physical therapy, 24-hour supervision recommended, Continue diuresis as below Continue at bedtime BiPAP  we will ask case manager to contact DME provider to determine if  she is indeed on BiPAP at SNF If she is on CPAP at SNF we need to transition her to CPAP in the next day or so, and get arterial blood gas after night of CPAP to determine if she needs CPAP versus BiPAP. Most likely patient would benefit from BiPAP given hypercarbia and morbid obesity  Limit narcotics Continue bronchodilators Requests RT to specify BiPAP settings and repeat ABG  Acute on chronic diastolic heart failure, evidence of cor pulmonale with mildly reduced right ventricular function Plan Continue telemetry Continue diuresis, Weight  278>255 pounds  Add back norvasc  Coag negative staph bacteremia -Etiology unclear; had 2 out of 2 cultures positive drawn from left and right antecubitals.  She does have diabetic ulcer on her lower extremity this may be the culprit  DC Ancef, now completed 8 days antibiotics  Acute on chronic kidney disease: Creatinine  Peter 2156, improved with diuresis, now 1.40 which is very close to her baseline  Avoid hypotension Renal dose medications  Hypernatremia and hypokalemia Encourage p.o. intake, thirst should continue to regulate water balance Empiric potassium replacement  Morbid obesity @ risk for malnutrition in setting of critical illness Aspiration precautions Advance diet Body mass index is 48.28 kg/m.   Anemia without bleeding Trend CBC, Hemoglobin stable around 10.5  Transfuse for hemoglobin less than 7     DM2 with hyperglycemia -Remains hyperglycemic Sliding scale insulin    DVT prophylaxsis heparin  Code Status:  Full code   Family Communication: Discussed in detail with the patient, all imaging results, lab results explained to the patient  Disposition Plan:   Anticipate discharge in 2-3 days, remains on high flow oxygen    Consultants:  PCCM  Procedures: 03/31/2018 endotracheal tube>> 6/24  CULTURES: Blood 6/18 > gram-positive cocci/MSSA (X2) Sputum 6/18 > not done Blood 6/21>>  CXR 6/21 >  continued mild interstitial edema. 2D echo 04/01/2018: EF 60 to 85% grade 1 diastolic dysfunction right ventricle systolic function was mildly reduced.      Antibiotics: Anti-infectives (From admission, onward)   Start     Dose/Rate Route Frequency Ordered Stop   04/04/18 1800  ceFAZolin (ANCEF) IVPB 2g/100 mL premix  Status:  Discontinued     2 g 200 mL/hr over 30 Minutes Intravenous Every 8 hours 04/04/18 1231 04/07/18 0837   04/02/18 2200  ceFAZolin (ANCEF) IVPB 2g/100 mL premix  Status:  Discontinued     2 g 200 mL/hr over 30 Minutes Intravenous Every 12 hours 04/02/18 1116 04/04/18 1231   04/01/18 1700  vancomycin (VANCOCIN) 1,500 mg in sodium chloride 0.9 % 500 mL IVPB  Status:  Discontinued     1,500 mg 250 mL/hr over 120 Minutes Intravenous Every 24 hours 03/31/18 1611 04/01/18 1541   04/01/18 1630  ceFAZolin (ANCEF) IVPB 2g/100 mL premix  Status:  Discontinued     2 g 200 mL/hr over 30 Minutes Intravenous Every 8 hours 04/01/18 1541 04/02/18 1116   03/31/18 2300  piperacillin-tazobactam (ZOSYN) IVPB 3.375 g  Status:  Discontinued     3.375 g 12.5 mL/hr over 240 Minutes Intravenous Every 8 hours 03/31/18 1611 04/01/18 1541   03/31/18 1700  vancomycin (VANCOCIN) 2,500 mg in sodium chloride 0.9 % 500 mL IVPB  Status:  Discontinued     2,500 mg 250 mL/hr over 120 Minutes Intravenous  Once 03/31/18 1611 04/01/18 1541   03/31/18 1630  piperacillin-tazobactam (ZOSYN) IVPB 3.375 g     3.375 g 100 mL/hr over 30 Minutes Intravenous  Once 03/31/18 1611 03/31/18 1712         HPI/Subjective: She is awake ,speech is garbled, she denies any cp, sob   Objective: Vitals:   04/08/18 0215 04/08/18 0500 04/08/18 0615 04/08/18 0800  BP: 116/61  132/73   Pulse: 71     Resp: 16  (!) 21   Temp:    98.1 F (36.7 C)  TempSrc:    Oral  SpO2: 100%     Weight:  115.9 kg (255 lb 8.2 oz)    Height:        Intake/Output Summary (Last 24 hours) at 04/08/2018 0849 Last data filed at  04/08/2018 0558 Gross per 24 hour  Intake 146.71 ml  Output 1100 ml  Net -953.29 ml    Exam:  Examination:  General exam: Appears calm and comfortable  Respiratory system: Clear to auscultation. Respiratory effort normal. Cardiovascular system: S1 & S2 heard, RRR. No JVD, murmurs, rubs, gallops or clicks. No pedal edema. Gastrointestinal system: Abdomen is nondistended, soft and nontender. No organomegaly or masses felt. Normal bowel sounds heard. Central nervous system: Alert and oriented. No focal neurological deficits. Extremities: Symmetric 5 x 5 power. Skin: No rashes, lesions or ulcers Psychiatry: Judgement and insight appear normal. Mood & affect appropriate.     Data Reviewed: I have personally reviewed following labs and imaging studies  Micro Results Recent Results (from the past 240 hour(s))  Culture, blood (routine x 2)     Status: Abnormal   Collection Time: 03/31/18  4:37 PM  Result Value Ref Range Status   Specimen Description  BLOOD LEFT ANTECUBITAL  Final   Special Requests   Final    BOTTLES DRAWN AEROBIC AND ANAEROBIC Blood Culture results may not be optimal due to an inadequate volume of blood received in culture bottles   Culture  Setup Time   Final    GRAM POSITIVE COCCI IN CLUSTERS AEROBIC BOTTLE ONLY CRITICAL VALUE NOTED.  VALUE IS CONSISTENT WITH PREVIOUSLY REPORTED AND CALLED VALUE.    Culture (A)  Final    STAPHYLOCOCCUS SPECIES (COAGULASE NEGATIVE) SUSCEPTIBILITIES PERFORMED ON PREVIOUS CULTURE WITHIN THE LAST 5 DAYS. Performed at [facility], [place] [address] [place], [place] [address]    Report Status 04/03/2018 FINAL  Final  Culture, blood (routine x 2)     Status: Abnormal   Collection Time: 03/31/18  4:42 PM  Result Value Ref Range Status   Specimen Description BLOOD RIGHT ANTECUBITAL  Final   Special Requests   Final    BOTTLES DRAWN AEROBIC AND ANAEROBIC Blood Culture results may not be optimal due to an inadequate volume of blood  received in culture bottles   Culture  Setup Time   Final    GRAM POSITIVE COCCI IN CLUSTERS IN BOTH AEROBIC AND ANAEROBIC BOTTLES CRITICAL RESULT CALLED TO, READ BACK BY AND VERIFIED WITH: Karlene Einstein PharmD 14:55 04/01/18 ([email or web address]) Performed at [facility], [place] [address] [place], [place] [address]    Culture STAPHYLOCOCCUS SPECIES (COAGULASE NEGATIVE) (A)  Final   Report Status 04/03/2018 FINAL  Final   Organism ID, Bacteria STAPHYLOCOCCUS SPECIES (COAGULASE NEGATIVE)  Final      Susceptibility   Staphylococcus species (coagulase negative) - MIC*    CIPROFLOXACIN <=0.5 SENSITIVE Sensitive     ERYTHROMYCIN 0.5 SENSITIVE Sensitive     GENTAMICIN <=0.5 SENSITIVE Sensitive     OXACILLIN <=0.25 SENSITIVE Sensitive     TETRACYCLINE <=1 SENSITIVE Sensitive     VANCOMYCIN <=0.5 SENSITIVE Sensitive     TRIMETH/SULFA <=10 SENSITIVE Sensitive     CLINDAMYCIN <=0.25 SENSITIVE Sensitive     RIFAMPIN <=0.5 SENSITIVE Sensitive     Inducible Clindamycin NEGATIVE Sensitive     * STAPHYLOCOCCUS SPECIES (COAGULASE NEGATIVE)  Blood Culture ID Panel (Reflexed)     Status: Abnormal   Collection Time: 03/31/18  4:42 PM  Result Value Ref Range Status   Enterococcus species NOT DETECTED NOT DETECTED Final   Listeria monocytogenes NOT DETECTED NOT DETECTED Final   Staphylococcus species DETECTED (A) NOT DETECTED Final    Comment: Methicillin (oxacillin) susceptible coagulase negative staphylococcus. Possible blood culture contaminant (unless isolated from more than one blood culture draw or clinical case suggests pathogenicity). No antibiotic treatment is indicated for blood  culture contaminants. CRITICAL RESULT CALLED TO, READ BACK BY AND VERIFIED WITH: Karlene Einstein PharmD 14:55 04/01/18 ([email or web address])    Staphylococcus aureus NOT DETECTED NOT DETECTED Final   Methicillin resistance NOT DETECTED NOT DETECTED Final   Streptococcus species NOT DETECTED NOT DETECTED Final   Streptococcus agalactiae  NOT DETECTED NOT DETECTED Final   Streptococcus pneumoniae NOT DETECTED NOT DETECTED Final   Streptococcus pyogenes NOT DETECTED NOT DETECTED Final   Acinetobacter baumannii NOT DETECTED NOT DETECTED Final   Enterobacteriaceae species NOT DETECTED NOT DETECTED Final   Enterobacter cloacae complex NOT DETECTED NOT DETECTED Final   Escherichia coli NOT DETECTED NOT DETECTED Final   Klebsiella oxytoca NOT DETECTED NOT DETECTED Final   Klebsiella pneumoniae NOT DETECTED NOT DETECTED Final   Proteus species NOT DETECTED NOT DETECTED Final   Serratia  marcescens NOT DETECTED NOT DETECTED Final   Haemophilus influenzae NOT DETECTED NOT DETECTED Final   Neisseria meningitidis NOT DETECTED NOT DETECTED Final   Pseudomonas aeruginosa NOT DETECTED NOT DETECTED Final   Candida albicans NOT DETECTED NOT DETECTED Final   Candida glabrata NOT DETECTED NOT DETECTED Final   Candida krusei NOT DETECTED NOT DETECTED Final   Candida parapsilosis NOT DETECTED NOT DETECTED Final   Candida tropicalis NOT DETECTED NOT DETECTED Final    Comment: Performed at [facility], [place] [address], [place], [place] [address]  MRSA PCR Screening     Status: Abnormal   Collection Time: 03/31/18  7:03 PM  Result Value Ref Range Status   MRSA by PCR POSITIVE (A) NEGATIVE Final    Comment:        The GeneXpert MRSA Assay (FDA approved for NASAL specimens only), is one component of a comprehensive MRSA colonization surveillance program. It is not intended to diagnose MRSA infection nor to guide or monitor treatment for MRSA infections. RESULT CALLED TO, READ BACK BY AND VERIFIED WITH: H DADAMO RN 04/01/18 0044 JDW Performed at [facility], [place] [address] [place], [place] [address]   Culture, blood (routine x 2)     Status: None (Preliminary result)   Collection Time: 04/03/18  2:56 PM  Result Value Ref Range Status   Specimen Description BLOOD RIGHT ANTECUBITAL  Final   Special Requests   Final     BOTTLES DRAWN AEROBIC AND ANAEROBIC Blood Culture adequate volume   Culture   Final    NO GROWTH 4 DAYS Performed at [facility], [place] [address] [place], [place] [address]    Report Status PENDING  Incomplete  Culture, blood (routine x 2)     Status: None (Preliminary result)   Collection Time: 04/03/18  2:59 PM  Result Value Ref Range Status   Specimen Description BLOOD RIGHT HAND  Final   Special Requests   Final    BOTTLES DRAWN AEROBIC ONLY Blood Culture adequate volume   Culture   Final    NO GROWTH 4 DAYS Performed at [facility], [place] [address] [place], [place] [address]    Report Status PENDING  Incomplete    Radiology Reports Ct Head Wo Contrast  Result Date: 04/01/2018 CLINICAL DATA:  72 y/o F; patient admitted with altered mental status, speech alteration, and respiratory distress. EXAM: CT HEAD WITHOUT CONTRAST TECHNIQUE: Contiguous axial images were obtained from the base of the skull through the vertex without intravenous contrast. COMPARISON:  06/22/2017 CT head. FINDINGS: Brain: No evidence of acute infarction, hemorrhage, hydrocephalus, extra-axial collection or mass lesion/mass effect. Stable mild chronic microvascular ischemic changes and parenchymal volume loss of the brain. Vascular: Calcific atherosclerosis of the carotid siphons. Skull: Normal. Negative for fracture or focal lesion. Sinuses/Orbits: Chronic right floor of orbit fracture and opacification of the right maxillary sinus. Nasoenteric tube. Other: None. IMPRESSION: 1. No acute intracranial abnormality identified. 2. Stable mild chronic microvascular ischemic changes and parenchymal volume loss of the brain for age. 3. Chronic right floor of orbit fracture and opacification of the right maxillary sinus. Electronically Signed   By: Kristine Garbe M.D.   On: 04/01/2018 01:35   Dg Chest Port 1 View  Result Date: 04/07/2018 CLINICAL DATA:  Shortness of breath EXAM: PORTABLE CHEST 1 VIEW  COMPARISON:  04/06/2018 FINDINGS: Cardiac shadow is enlarged but stable. Endotracheal tube and nasogastric catheter have been removed in the interval. Left  jugular central line is again seen and stable. The lungs are well aerated bilaterally. Significant improved aeration in the bases is noted. No bony abnormality is seen. IMPRESSION: Improved aeration from the prior exam. Electronically Signed   By: Inez Catalina M.D.   On: 04/07/2018 07:50   Dg Chest Port 1 View  Result Date: 04/06/2018 CLINICAL DATA:  Acute respiratory failure.  Hypoxemia. EXAM: PORTABLE CHEST 1 VIEW COMPARISON:  April 05, 2018 FINDINGS: The ETT is in good position. The NG tube terminates below today's film. A left central line is in stable position. No pneumothorax. Opacity in left base is more pronounced in the interval. Mild opacity in the right base is also more pronounced. IMPRESSION: 1. Support apparatus as above. 2. Bibasilar opacities, worsened in the interval. Electronically Signed   By: Dorise Bullion III M.D   On: 04/06/2018 07:21   Dg Chest Port 1 View  Result Date: 04/05/2018 CLINICAL DATA:  Followup ventilated patient. EXAM: PORTABLE CHEST 1 VIEW COMPARISON:  04/04/2018 and multiple prior studies. FINDINGS: Lung volumes remain low. There is vascular congestion and by basilar lung opacity, the latter finding consistent with atelectasis and small effusions. No convincing pulmonary edema. Cardiac silhouette is mildly enlarged. Endotracheal tube, left internal jugular central venous line and nasogastric tube are stable. IMPRESSION: 1. No significant change from the most recent prior exam. 2. Persistent basilar opacities consistent with a combination of small effusions and atelectasis. This is greater on the left. There is vascular congestion without convincing pulmonary edema. 3. Support apparatus is stable. Electronically Signed   By: Lajean Manes M.D.   On: 04/05/2018 09:30   Dg Chest Port 1 View  Result Date:  04/04/2018 CLINICAL DATA:  Respiratory failure. EXAM: PORTABLE CHEST 1 VIEW COMPARISON:  04/03/2018 FINDINGS: Endotracheal tube has tip 2.1 cm above the carina. Left IJ central venous catheter unchanged with tip over the SVC. Nasogastric tube is present with tip over the distal stomach in the midline and side-port over the stomach in the left upper quadrant. Lungs are adequately inflated with interval improvement in previously noted left mid lung and bibasilar opacification likely improving interstitial edema. Persistent left base/retrocardiac opacification with obscuration of the left hemidiaphragm likely due to effusion and atelectasis although infection is possible. Stable cardiomegaly. Remainder of the exam is unchanged. IMPRESSION: Moderate interval improvement in left mid lung and bibasilar opacification likely improving interstitial edema. Persistent left base/retrocardiac opacification likely effusion with atelectasis although infection is possible. Tubes and lines as described. Electronically Signed   By: Marin Olp M.D.   On: 04/04/2018 09:04   Dg Chest Port 1 View  Result Date: 04/03/2018 CLINICAL DATA:  Respiratory failure. EXAM: PORTABLE CHEST 1 VIEW COMPARISON:  04/02/2018. FINDINGS: Endotracheal tube, NG tube left central line in stable position. Cardiomegaly again noted. Bibasilar and left mid lung field pulmonary infiltrates/edema again noted. Small pleural effusions again noted. No pneumothorax. IMPRESSION: 1.  Lines and tubes in stable position. 2. Cardiomegaly with bibasilar and left mid lung field pulmonary infiltrates/edema again noted. Small pleural effusions again noted. Similar findings on prior exam. No pneumothorax. Electronically Signed   By: Marcello Moores  [place]   On: 04/03/2018 05:58   Dg Chest Portable 1 View  Result Date: 04/02/2018 CLINICAL DATA:  Hypoxia EXAM: PORTABLE CHEST 1 VIEW COMPARISON:  April 01, 2018 FINDINGS: Endotracheal tube tip is 2.6 cm above the carina.  Nasogastric tube tip and side port below the diaphragm. Central catheter tip is in the left innominate vein near  the junction with the superior vena cava. No evident pneumothorax. There are pleural effusions bilaterally with patchy airspace consolidation in both lower lobe regions. No new opacity evident. There is stable cardiomegaly. The pulmonary vascularity appears within normal limits. No evident adenopathy. There is aortic atherosclerosis. No evident bone lesions. IMPRESSION: Tube and catheter positions as described. No pneumothorax. There remains cardiomegaly with pleural effusions. Airspace consolidation in the bases is concerning for pneumonia. There may be alveolar edema as well. There is aortic atherosclerosis. Aortic Atherosclerosis (ICD10-I70.0). Electronically Signed   By: Lowella Grip III M.D.   On: 04/02/2018 08:25   Dg Chest Port 1 View  Result Date: 04/01/2018 CLINICAL DATA:  Hypoxia EXAM: PORTABLE CHEST 1 VIEW COMPARISON:  March 31, 2018 FINDINGS: Endotracheal tube tip is 1.0 cm above the carina. Central catheter tip is in the left innominate vein near the junction with the superior vena cava. Nasogastric tube tip and side port are below the diaphragm with side port seen in the stomach. No pneumothorax. There is cardiomegaly with pulmonary vascularity normal. There is patchy airspace consolidation in both lower lobes with small pleural effusions bilaterally. No evident adenopathy. There is aortic atherosclerosis. There is degenerative change in each shoulder. IMPRESSION: Tube and catheter positions as described without pneumothorax. Note that the endotracheal tube tip is near the carina. It may be prudent to consider withdrawing endotracheal tube approximately 3 cm. Cardiomegaly with lower lobe consolidation and small pleural effusions. Suspect bibasilar pneumonia, although alveolar edema may be present in the lung bases as well. There is aortic atherosclerosis. Aortic Atherosclerosis  (ICD10-I70.0). Electronically Signed   By: Lowella Grip III M.D.   On: 04/01/2018 07:30   Portable Chest X-ray  Result Date: 03/31/2018 CLINICAL DATA:  72 year old female post endotracheal tube placement. Subsequent encounter. EXAM: PORTABLE CHEST 1 VIEW COMPARISON:  03/31/2018 1:02 p.m. FINDINGS: Endotracheal tube tip 1.8 cm above the carina. Left central line tip proximal superior vena cava level. Nasogastric tube courses below the diaphragm. Tip is not included on the present exam. No pneumothorax. Cardiomegaly. Persistent slightly asymmetric airspace disease suggestive of pulmonary edema. Calcified aorta. IMPRESSION: Endotracheal tube tip 1.8 cm above the carina. Left central line tip proximal superior vena cava level. No pneumothorax. Cardiomegaly. Persistent slightly asymmetric airspace disease suggestive of pulmonary edema. Aortic Atherosclerosis (ICD10-I70.0). Electronically Signed   By: Genia Del M.D.   On: 03/31/2018 19:46   Dg Chest Portable 1 View  Result Date: 03/31/2018 CLINICAL DATA:  Respiratory distress. History of chronic CHF, diabetes, chronic renal insufficiency, morbid obesity. EXAM: PORTABLE CHEST 1 VIEW COMPARISON:  Portable chest x-ray of May 28, 2017 FINDINGS: The lungs are well-expanded. The pulmonary vascularity is engorged and the interstitial markings are increased. There is a small left pleural effusion. The cardiac silhouette is enlarged. The trachea is deviated slightly to the right chronically. There is calcification in the wall of the aortic arch. IMPRESSION: CHF with pulmonary interstitial and early alveolar edema and small left pleural effusion. Thoracic aortic atherosclerosis. Electronically Signed   By: David  Martinique M.D.   On: 03/31/2018 13:14   Dg Abd Portable 1v  Result Date: 03/31/2018 CLINICAL DATA:  72 year old female with nasogastric tube placement. Initial encounter. EXAM: PORTABLE ABDOMEN - 1 VIEW COMPARISON:  Chest x-ray same date.  CT abdomen  pelvis 05/29/2017. FINDINGS: Nasogastric tube tip gastric antrum level with side hole fundus-body junction level. Limited evaluation of bowel gas pattern without gross abnormality. The possibility of free intraperitoneal air cannot be assessed on a  supine view. IUD in place. IMPRESSION: Nasogastric tube tip gastric antrum level with side hole fundus-body junction level. Electronically Signed   By: Genia Del M.D.   On: 03/31/2018 19:48     CBC Recent Labs  Lab 04/03/18 0540 04/04/18 0419 04/05/18 0322 04/07/18 0351 04/08/18 0620  WBC 5.8 5.2 5.5 6.1 5.9  HGB 9.2* 10.0* 10.3* 10.7* 10.8*  HCT 32.3* 35.6* 36.3 38.4 38.2  PLT 204 206 216 222 235  MCV 88.3 89.0 89.0 88.1 88.0  MCH 25.1* 25.0* 25.2* 24.5* 24.9*  MCHC 28.5* 28.1* 28.4* 27.9* 28.3*  RDW 20.6* 20.1* 20.0* 19.5* 19.2*  LYMPHSABS 0.9  --   --   --   --   MONOABS 1.0  --   --   --   --   EOSABS 0.4  --   --   --   --   BASOSABS 0.0  --   --   --   --     Chemistries  Recent Labs  Lab 04/01/18 1713  04/02/18 0629 04/02/18 1654 04/03/18 0540 04/04/18 0419 04/05/18 0322 04/06/18 0353 04/07/18 0351 04/08/18 0620  NA  --    < > 146*  --  149* 150* 149* 149* 145 143  K  --    < > 4.2  --  3.6 3.4* 3.3* 3.4* 3.8 4.4  CL  --    < > 104  --  105 100* 96* 96* 96* 96*  CO2  --    < > 34*  --  36* 41* 45* 42* 40* 38*  GLUCOSE  --    < > 141*  --  126* 140* 188* 187* 152* 183*  BUN  --    < > 47*  --  51* 45* 42* 39* 32* 30*  CREATININE  --    < > 2.56*  --  2.25* 1.76* 1.50* 1.46* 1.40* 1.40*  CALCIUM  --    < > 8.3*  --  8.5* 8.8* 9.1 9.2 9.2 9.2  MG 2.0  --  2.0 2.0 2.2  --  1.8  --   --   --   AST  --   --   --   --   --   --   --   --  17  --   ALT  --   --   --   --   --   --   --   --  <5  --   ALKPHOS  --   --   --   --   --   --   --   --  40  --   BILITOT  --   --   --   --   --   --   --   --  0.7  --    < > = values in this interval not displayed.    ------------------------------------------------------------------------------------------------------------------ estimated creatinine clearance is 42.4 mL/min (A) (by C-G formula based on SCr of 1.4 mg/dL (H)). ------------------------------------------------------------------------------------------------------------------ No results for input(s): HGBA1C in the last 72 hours. ------------------------------------------------------------------------------------------------------------------ No results for input(s): CHOL, HDL, LDLCALC, TRIG, CHOLHDL, LDLDIRECT in the last 72 hours. ------------------------------------------------------------------------------------------------------------------ No results for input(s): TSH, T4TOTAL, T3FREE, THYROIDAB in the last 72 hours.  Invalid input(s): FREET3 ------------------------------------------------------------------------------------------------------------------ No results for input(s): VITAMINB12, FOLATE, FERRITIN, TIBC, IRON, RETICCTPCT in the last 72 hours.  Coagulation profile No results for input(s): INR, PROTIME in the last 168 hours.  No results for  input(s): DDIMER in the last 72 hours.  Cardiac Enzymes No results for input(s): CKMB, TROPONINI, MYOGLOBIN in the last 168 hours.  Invalid input(s): CK ------------------------------------------------------------------------------------------------------------------ Invalid input(s): POCBNP   CBG: Recent Labs  Lab 04/07/18 1757 04/07/18 2005 04/08/18 0007 04/08/18 0429 04/08/18 0801  GLUCAP 208* 205* 186* 178* 150*       Studies: Dg Chest Port 1 View  Result Date: 04/07/2018 CLINICAL DATA:  Shortness of breath EXAM: PORTABLE CHEST 1 VIEW COMPARISON:  04/06/2018 FINDINGS: Cardiac shadow is enlarged but stable. Endotracheal tube and nasogastric catheter have been removed in the interval. Left jugular central line is again seen and stable. The lungs are well aerated  bilaterally. Significant improved aeration in the bases is noted. No bony abnormality is seen. IMPRESSION: Improved aeration from the prior exam. Electronically Signed   By: Inez Catalina M.D.   On: 04/07/2018 07:50      Lab Results  Component Value Date   HGBA1C 7.0 11/27/2016   HGBA1C 7.2 06/26/2016   HGBA1C 10.1 04/03/2016   Lab Results  Component Value Date   MICROALBUR 4.2 02/01/2017   LDLCALC 111 02/01/2017   CREATININE 1.40 (H) 04/08/2018       Scheduled Meds: . amLODipine  5 mg Oral Daily  . arformoterol  15 mcg Nebulization BID  . aspirin  81 mg Oral Daily  . budesonide (PULMICORT) nebulizer solution  0.5 mg Nebulization BID  . DULoxetine  30 mg Oral BID  . famotidine  40 mg Oral QHS  . feeding supplement (GLUCERNA SHAKE)  237 mL Oral TID WC  . furosemide  60 mg Intravenous Q12H  . heparin  5,000 Units Subcutaneous Q8H  . insulin aspart  0-20 Units Subcutaneous BID AC  . ipratropium-albuterol  3 mL Nebulization Q6H  . potassium chloride  40 mEq Oral BID  . senna  1 tablet Oral QHS   Continuous Infusions: . dextrose Stopped (04/07/18 1338)     LOS: 8 days    Time spent: >30 MINS    Reyne Dumas  [facility] Pager [phone]. If 7PM-7AM, please contact night-coverage at [email or web address], password [facility] 04/08/2018, 8:49 AM  LOS: 8 days

## 2018-04-09 LAB — GLUCOSE, CAPILLARY
GLUCOSE-CAPILLARY: 124 mg/dL — AB (ref 70–99)
GLUCOSE-CAPILLARY: 126 mg/dL — AB (ref 70–99)
GLUCOSE-CAPILLARY: 134 mg/dL — AB (ref 70–99)
GLUCOSE-CAPILLARY: 175 mg/dL — AB (ref 70–99)
GLUCOSE-CAPILLARY: 208 mg/dL — AB (ref 70–99)
Glucose-Capillary: 140 mg/dL — ABNORMAL HIGH (ref 70–99)

## 2018-04-09 LAB — BLOOD GAS, ARTERIAL
Acid-Base Excess: 14.3 mmol/L — ABNORMAL HIGH (ref 0.0–2.0)
Bicarbonate: 39.5 mmol/L — ABNORMAL HIGH (ref 20.0–28.0)
DRAWN BY: 51806
O2 Content: 3 L/min
O2 Saturation: 96.4 %
PCO2 ART: 61 mmHg — AB (ref 32.0–48.0)
PH ART: 7.427 (ref 7.350–7.450)
Patient temperature: 98.6
pO2, Arterial: 84.1 mmHg (ref 83.0–108.0)

## 2018-04-09 LAB — COMPREHENSIVE METABOLIC PANEL
ALBUMIN: 2.6 g/dL — AB (ref 3.5–5.0)
ALK PHOS: 47 U/L (ref 38–126)
ALT: 5 U/L (ref 0–44)
ANION GAP: 8 (ref 5–15)
AST: 18 U/L (ref 15–41)
BUN: 31 mg/dL — AB (ref 8–23)
CO2: 38 mmol/L — AB (ref 22–32)
Calcium: 9 mg/dL (ref 8.9–10.3)
Chloride: 95 mmol/L — ABNORMAL LOW (ref 98–111)
Creatinine, Ser: 1.5 mg/dL — ABNORMAL HIGH (ref 0.44–1.00)
GFR calc Af Amer: 39 mL/min — ABNORMAL LOW (ref >60)
GFR calc non Af Amer: 33 mL/min — ABNORMAL LOW (ref >60)
GLUCOSE: 130 mg/dL — AB (ref 70–99)
POTASSIUM: 4.5 mmol/L (ref 3.5–5.1)
SODIUM: 141 mmol/L (ref 135–145)
Total Bilirubin: 0.7 mg/dL (ref 0.3–1.2)
Total Protein: 7.3 g/dL (ref 6.5–8.1)

## 2018-04-09 LAB — CBC
HEMATOCRIT: 39 % (ref 36.0–46.0)
HEMOGLOBIN: 11.1 g/dL — AB (ref 12.0–15.0)
MCH: 25.1 pg — ABNORMAL LOW (ref 26.0–34.0)
MCHC: 28.5 g/dL — AB (ref 30.0–36.0)
MCV: 88 fL (ref 78.0–100.0)
Platelets: 268 10*3/uL (ref 150–400)
RBC: 4.43 MIL/uL (ref 3.87–5.11)
RDW: 18.9 % — AB (ref 11.5–15.5)
WBC: 5.8 10*3/uL (ref 4.0–10.5)

## 2018-04-09 MED ORDER — IPRATROPIUM-ALBUTEROL 0.5-2.5 (3) MG/3ML IN SOLN
3.0000 mL | Freq: Four times a day (QID) | RESPIRATORY_TRACT | Status: DC | PRN
  Filled 2018-04-09: qty 3
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
## 2018-04-09 NOTE — Progress Notes (Signed)
CSW sent BiPap settings to [facility].   Percell Locus Caliyah Sieh LCSW [phone]

## 2018-04-09 NOTE — Progress Notes (Addendum)
Physical Therapy Treatment Patient Details Name: Heather Zimmerman. Cogliano MRN: [id number] DOB: 1946-01-18 Today's Date: 04/09/2018    History of Present Illness 72 y/o female OSA, CKD, HTN, presented with acute respiratory failure due to CHF exacerbation.      PT Comments    Patient tolerated session well. Pt requires min A for bed mobility except positioning once in supine which pt is able to do with supervision/ming guard with bed rails necessary. Supervision/min guard for sitting balance EOB with dynamic activities. Pt transferred bed to chair with mechanical lift. Continue to progress as tolerated.    Follow Up Recommendations  LTACH;Supervision/Assistance - 24 hour(doesn't want to return to [facility])     Equipment Recommendations  None recommended by PT    Recommendations for Other Services       Precautions / Restrictions Precautions Precautions: Fall Precaution Comments: R BKA Restrictions Weight Bearing Restrictions: No    Mobility  Bed Mobility Overal bed mobility: Needs Assistance Bed Mobility: Supine to Sit;Sit to Supine     Supine to sit: Min assist Sit to supine: Min assist   General bed mobility comments: use of bed rail required  Transfers                 General transfer comment: maximove utilized for transfer bed to recliner with assist of NT  Ambulation/Gait             General Gait Details: pt non-ambulatory   Stairs             Wheelchair Mobility    Modified Rankin (Stroke Patients Only)       Balance Overall balance assessment: Needs assistance Sitting-balance support: Feet supported Sitting balance-Leahy Scale: Fair                                      Cognition Arousal/Alertness: Awake/alert Behavior During Therapy: WFL for tasks assessed/performed Overall Cognitive Status: Within Functional Limits for tasks assessed                                        Exercises General  Exercises - Lower Extremity Ankle Circles/Pumps: AROM;Left;10 reps;Seated Long Arc Quad: AROM;Both;10 reps;Seated Hip Flexion/Marching: AROM;Both;10 reps;Seated Other Exercises Other Exercises: bilateral push ups    General Comments        Pertinent Vitals/Pain Pain Assessment: Faces Faces Pain Scale: Hurts even more Pain Location: stomach Pain Descriptors / Indicators: Grimacing;Sharp;Aching Pain Intervention(s): Monitored during session    Home Living                      Prior Function            PT Goals (current goals can now be found in the care plan section) Acute Rehab PT Goals Patient Stated Goal: go to another rehab PT Goal Formulation: With patient Time For Goal Achievement: 04/21/18 Potential to Achieve Goals: Good Progress towards PT goals: Progressing toward goals    Frequency    Min 2X/week      PT Plan Current plan remains appropriate    Co-evaluation              AM-PAC PT "6 Clicks" Daily Activity  Outcome Measure  Difficulty turning over in bed (including adjusting bedclothes, sheets and blankets)?: Unable Difficulty moving from  lying on back to sitting on the side of the bed? : Unable Difficulty sitting down on and standing up from a chair with arms (e.g., wheelchair, bedside commode, etc,.)?: Unable Help needed moving to and from a bed to chair (including a wheelchair)?: Total Help needed walking in hospital room?: Total Help needed climbing 3-5 steps with a railing? : Total 6 Click Score: 6    End of Session Equipment Utilized During Treatment: Oxygen Activity Tolerance: Patient tolerated treatment well Patient left: with call bell/phone within reach;in chair;with nursing/sitter in room Nurse Communication: Mobility status PT Visit Diagnosis: Unsteadiness on feet (R26.81);Muscle weakness (generalized) (M62.81)     Time: 8757-9728 PT Time Calculation (min) (ACUTE ONLY): 51 min  Charges:  $Therapeutic Exercise: 8-22  mins $Therapeutic Activity: 23-37 mins                    G Codes:       Earney Navy, PTA Pager: [phone]     Darliss Cheney 04/09/2018, 1:53 PM

## 2018-04-09 NOTE — Progress Notes (Signed)
Patient ID: Heather Zimmerman. Wallman, female   DOB: 1945/12/01, 72 y.o.   MRN: [id number]    Bipap settings:  Adult full face mask, medium,  Set rate: 8 Respiratory rate: 25 IPAP: 14 EPAP: 6 Oxygen percent: 40 Minute Ventilation: 11.6 Leak 19 Tidal [place]: 415 Auto Titrate: No Press High Alarm: 25 Press Low Alarm: 5

## 2018-04-09 NOTE — Clinical Social Work Note (Signed)
Clinical Social Work Assessment  Patient Details  Name: Heather Zimmerman. Hauge MRN: [id number] Date of Birth: [birth year]  Date of referral:  04/09/18               Reason for consult:  Discharge Planning                Permission sought to share information with:  Facility Sport and exercise psychologist Permission granted to share information::  Yes, Verbal Permission Granted  Name::     Estill Bamberg  Agency::  [facility]  Relationship::  Sister  Contact Information:  [phone]  Housing/Transportation Living arrangements for the past 2 months:  [place] of Information:  Patient, Siblings Patient Interpreter Needed:  None Criminal Activity/Legal Involvement Pertinent to Current Situation/Hospitalization:  No - Comment as needed Significant Relationships:  Siblings Lives with:  Facility Resident Do you feel safe going back to the place where you live?  No Need for family participation in patient care:  No (Coment)  Care giving concerns:  CSW received consult regarding discharge plan. Patient reported that she has resided at [facility] for the past few years. She always asks to change facilities, but has always been unsuccessful due to being long term Medicaid and multiple medical issues.  CSW to continue to follow and assist with discharge planning needs.   Social Worker assessment / plan:  CSW spoke with patient concerning return to [place].   Employment status:  Retired Forensic scientist:  Information systems manager, Medicaid In [place] PT Recommendations:  [place] / Referral to community resources:  [place]  Patient/Family's Response to care:  Patient recognizes need for returning to [place] at discharge. She reports that the facilities able to accept her are on the same level as [facility]. She asked that CSW contact her sister, Estill Bamberg, to give her patient's room number as she lives in [place]. CSW spoke with Estill Bamberg and provided  patient's contact info. She also expressed concern that patient is far away from her and unhappy. She questioned whether patient could be sent to [facility]. CSW explained that she would be responsible for selecting a facility and paying for transportation, which is expensive. Estill Bamberg thanked CSW for information and said she will follow up with [facility] staff. [facility] liaison informed.   Patient/Family's Understanding of and Emotional Response to Diagnosis, Current Treatment, and Prognosis:  Patient/family is realistic regarding therapy needs and the fact that she will return to [place]. Patient expressed understanding of CSW role and discharge process as well as medical condition. No questions/concerns about plan or treatment.    Emotional Assessment Appearance:  Appears stated age Attitude/Demeanor/Rapport:  Gracious Affect (typically observed):  Accepting, Appropriate, Pleasant Orientation:  Oriented to Self, Oriented to Place, Oriented to  Time, Oriented to Situation Alcohol / Substance use:  Not Applicable Psych involvement (Current and /or in the community):  No (Comment)  Discharge Needs  Concerns to be addressed:  Care Coordination Readmission within the last 30 days:  No Current discharge risk:  None Barriers to Discharge:  Continued Medical Work up   [employer], Latanya Presser 04/09/2018, 3:33 PM

## 2018-04-09 NOTE — Progress Notes (Signed)
[facility] PROGRESS NOTE  Heather Zimmerman. [name] ACZ:[id number] DOB: [birth year] DOA: 03/31/2018   PCP: Patient, No Pcp Per     Assessment/Plan: Active Problems:   Acute respiratory failure with hypoxia and hypercapnia (HCC)   Endotracheal tube present   Pulmonary edema   72 y/o female OSA, CKD, HTN, presented with acute respiratory failure due to CHF exacerbation on 6/18.   initially unresponsive managed on BiPAP, prior history of severe OSA and multiple admissions, She was reportedly seen early am and in usual state of health.  She apparently went to speech therapy and after completion, she was noted to be dyspneic, non-verbal and altered.  She was put on a NRB for hypoxia.  Sats were in the 60's. The patient was given narcan without significant response.  She was transported to ED where she remained altered and hypoxic.  On admission found to be hypercarbic with a pH of 7.17, PCO2 greater than 100.  Initial chest x-ray showed CHF with pulmonary interstitial and early alveolar edema. Also deemed to be septic and started on vancomycin and Zosyn. Patient subsequently intubated, extubated 6/24, transferred to [facility] 6/25. Currently on 6 L of oxygen . Blood culture on 6/18 positive for gram-positive cocci/MSSA (X2). Currently on Ancef.  Assessment and plan  Acute respiratory failure with hypoxemia in setting of acute pulmonary edema/acute on chronic diastolic heart failure OSA Chest x-ray shows improvement Successfully extubated on 6/24 Currently 8.5 L negative Tolerated at bedtime BiPAP  patient would need to continue BiPAP at discharge.   Currently at 4 L nasal cannula, her baseline is 2 L, improving There is conflicting data in the medical records as to whether or not she is on CPAP or BiPAP at home, however which ever mode she has been noncompliant with this Patient has been evaluated by physical therapy, 24-hour supervision recommended, SNF Continue diuresis as below Continue at  bedtime BiPAP , ABG this morning, pH of 7.4, PCO2 61, PO2 84 Patient's PCO2 chronically elevated therefore I think she would benefit from  BiPAP. Will initiate BiPAP daily at bedtime, RT to specify settings  Most likely patient would benefit from BiPAP given hypercarbia and morbid obesity . I think her baseline PCO2 is about 60 Limit narcotics Continue bronchodilators    Acute on chronic diastolic heart failure, evidence of cor pulmonale with mildly reduced right ventricular function Continue telemetry Continue diuresis, Weight  278>255 pounds  Continue norvasc Continue IV diuretics, follow renal function closely  Coag negative staph bacteremia Etiology unclear; had 2 out of 2 cultures positive drawn from left and right antecubitals.  She does have diabetic ulcer on her lower extremity this may be the culprit  DC Ancef, now completed 8 days antibiotics  Acute on chronic kidney disease: Creatinine  Peter 2156, improved with diuresis, now 1.50 which is very close to her baseline  Avoid hypotension Renal dose medications  Hypernatremia and hypokalemia Encourage p.o. intake, thirst should continue to regulate water balance Empiric potassium replacement  Morbid obesity @ risk for malnutrition in setting of critical illness Aspiration precautions Advance diet Body mass index is 48.28 kg/m. Will get a formal speech therapy evaluation  Anemia without bleeding Trend CBC, Hemoglobin stable around 10.5  Transfuse for hemoglobin less than 7     DM2 with hyperglycemia -Remains hyperglycemic Sliding scale insulin    DVT prophylaxsis heparin  Code Status:  Full code   Family Communication: Discussed in detail with the patient, all imaging results, lab  results explained to the patient   Disposition Plan:   Anticipate discharge after SNF and BiPAP is set up,      Consultants:  PCCM  Procedures: 03/31/2018 endotracheal tube>> 6/24  CULTURES: Blood 6/18 > gram-positive  cocci/MSSA (X2) Sputum 6/18 > not done Blood 6/21>>  CXR 6/21 > continued mild interstitial edema. 2D echo 04/01/2018: EF 60 to 00% grade 1 diastolic dysfunction right ventricle systolic function was mildly reduced.      Antibiotics: Anti-infectives (From admission, onward)   Start     Dose/Rate Route Frequency Ordered Stop   04/04/18 1800  ceFAZolin (ANCEF) IVPB 2g/100 mL premix  Status:  Discontinued     2 g 200 mL/hr over 30 Minutes Intravenous Every 8 hours 04/04/18 1231 04/07/18 0837   04/02/18 2200  ceFAZolin (ANCEF) IVPB 2g/100 mL premix  Status:  Discontinued     2 g 200 mL/hr over 30 Minutes Intravenous Every 12 hours 04/02/18 1116 04/04/18 1231   04/01/18 1700  vancomycin (VANCOCIN) 1,500 mg in sodium chloride 0.9 % 500 mL IVPB  Status:  Discontinued     1,500 mg 250 mL/hr over 120 Minutes Intravenous Every 24 hours 03/31/18 1611 04/01/18 1541   04/01/18 1630  ceFAZolin (ANCEF) IVPB 2g/100 mL premix  Status:  Discontinued     2 g 200 mL/hr over 30 Minutes Intravenous Every 8 hours 04/01/18 1541 04/02/18 1116   03/31/18 2300  piperacillin-tazobactam (ZOSYN) IVPB 3.375 g  Status:  Discontinued     3.375 g 12.5 mL/hr over 240 Minutes Intravenous Every 8 hours 03/31/18 1611 04/01/18 1541   03/31/18 1700  vancomycin (VANCOCIN) 2,500 mg in sodium chloride 0.9 % 500 mL IVPB  Status:  Discontinued     2,500 mg 250 mL/hr over 120 Minutes Intravenous  Once 03/31/18 1611 04/01/18 1541   03/31/18 1630  piperacillin-tazobactam (ZOSYN) IVPB 3.375 g     3.375 g 100 mL/hr over 30 Minutes Intravenous  Once 03/31/18 1611 03/31/18 1712         HPI/Subjective: She is awake ,speech is garbled,  working with physical therapy and sitting up in the chair  Objective: Vitals:   04/08/18 2215 04/09/18 0036 04/09/18 0216 04/09/18 0755  BP: 134/74  124/82   Pulse:      Resp: 18  (!) 21   Temp:  98.2 F (36.8 C)    TempSrc:  Oral    SpO2:    97%  Weight:      Height:         Intake/Output Summary (Last 24 hours) at 04/09/2018 0905 Last data filed at 04/09/2018 0604 Gross per 24 hour  Intake 240 ml  Output 2200 ml  Net -1960 ml    Exam:  Examination:  General exam: Appears calm and comfortable  Respiratory system: Clear to auscultation. Respiratory effort normal. Cardiovascular system: S1 & S2 heard, RRR. No JVD, murmurs, rubs, gallops or clicks. No pedal edema. Gastrointestinal system: Abdomen is nondistended, soft and nontender. No organomegaly or masses felt. Normal bowel sounds heard. Central nervous system: Alert and oriented. No focal neurological deficits. Extremities: Right lower extremity BKA Skin: No rashes, lesions or ulcers Psychiatry: Judgement and insight appear normal. Mood & affect appropriate.     Data Reviewed: I have personally reviewed following labs and imaging studies  Micro Results Recent Results (from the past 240 hour(s))  Culture, blood (routine x 2)     Status: Abnormal   Collection Time: 03/31/18  4:37 PM  Result Value  Ref Range Status   Specimen Description BLOOD LEFT ANTECUBITAL  Final   Special Requests   Final    BOTTLES DRAWN AEROBIC AND ANAEROBIC Blood Culture results may not be optimal due to an inadequate volume of blood received in culture bottles   Culture  Setup Time   Final    GRAM POSITIVE COCCI IN CLUSTERS AEROBIC BOTTLE ONLY CRITICAL VALUE NOTED.  VALUE IS CONSISTENT WITH PREVIOUSLY REPORTED AND CALLED VALUE.    Culture (A)  Final    STAPHYLOCOCCUS SPECIES (COAGULASE NEGATIVE) SUSCEPTIBILITIES PERFORMED ON PREVIOUS CULTURE WITHIN THE LAST 5 DAYS. Performed at [facility], [place] [address], [place], [place] [address]    Report Status 04/03/2018 FINAL  Final  Culture, blood (routine x 2)     Status: Abnormal   Collection Time: 03/31/18  4:42 PM  Result Value Ref Range Status   Specimen Description BLOOD RIGHT ANTECUBITAL  Final   Special Requests   Final    BOTTLES DRAWN AEROBIC AND  ANAEROBIC Blood Culture results may not be optimal due to an inadequate volume of blood received in culture bottles   Culture  Setup Time   Final    GRAM POSITIVE COCCI IN CLUSTERS IN BOTH AEROBIC AND ANAEROBIC BOTTLES CRITICAL RESULT CALLED TO, READ BACK BY AND VERIFIED WITH: Karlene Einstein PharmD 14:55 04/01/18 ([email or web address]) Performed at [facility], [place] [address], [place], [place] [address]    Culture STAPHYLOCOCCUS SPECIES (COAGULASE NEGATIVE) (A)  Final   Report Status 04/03/2018 FINAL  Final   Organism ID, Bacteria STAPHYLOCOCCUS SPECIES (COAGULASE NEGATIVE)  Final      Susceptibility   Staphylococcus species (coagulase negative) - MIC*    CIPROFLOXACIN <=0.5 SENSITIVE Sensitive     ERYTHROMYCIN 0.5 SENSITIVE Sensitive     GENTAMICIN <=0.5 SENSITIVE Sensitive     OXACILLIN <=0.25 SENSITIVE Sensitive     TETRACYCLINE <=1 SENSITIVE Sensitive     VANCOMYCIN <=0.5 SENSITIVE Sensitive     TRIMETH/SULFA <=10 SENSITIVE Sensitive     CLINDAMYCIN <=0.25 SENSITIVE Sensitive     RIFAMPIN <=0.5 SENSITIVE Sensitive     Inducible Clindamycin NEGATIVE Sensitive     * STAPHYLOCOCCUS SPECIES (COAGULASE NEGATIVE)  Blood Culture ID Panel (Reflexed)     Status: Abnormal   Collection Time: 03/31/18  4:42 PM  Result Value Ref Range Status   Enterococcus species NOT DETECTED NOT DETECTED Final   Listeria monocytogenes NOT DETECTED NOT DETECTED Final   Staphylococcus species DETECTED (A) NOT DETECTED Final    Comment: Methicillin (oxacillin) susceptible coagulase negative staphylococcus. Possible blood culture contaminant (unless isolated from more than one blood culture draw or clinical case suggests pathogenicity). No antibiotic treatment is indicated for blood  culture contaminants. CRITICAL RESULT CALLED TO, READ BACK BY AND VERIFIED WITH: Karlene Einstein PharmD 14:55 04/01/18 ([email or web address])    Staphylococcus aureus NOT DETECTED NOT DETECTED Final   Methicillin resistance NOT DETECTED NOT DETECTED  Final   Streptococcus species NOT DETECTED NOT DETECTED Final   Streptococcus agalactiae NOT DETECTED NOT DETECTED Final   Streptococcus pneumoniae NOT DETECTED NOT DETECTED Final   Streptococcus pyogenes NOT DETECTED NOT DETECTED Final   Acinetobacter baumannii NOT DETECTED NOT DETECTED Final   Enterobacteriaceae species NOT DETECTED NOT DETECTED Final   Enterobacter cloacae complex NOT DETECTED NOT DETECTED Final   Escherichia coli NOT DETECTED NOT DETECTED Final   Klebsiella oxytoca NOT DETECTED NOT DETECTED Final   Klebsiella pneumoniae NOT DETECTED NOT DETECTED Final   Proteus species NOT  DETECTED NOT DETECTED Final   Serratia marcescens NOT DETECTED NOT DETECTED Final   Haemophilus influenzae NOT DETECTED NOT DETECTED Final   Neisseria meningitidis NOT DETECTED NOT DETECTED Final   Pseudomonas aeruginosa NOT DETECTED NOT DETECTED Final   Candida albicans NOT DETECTED NOT DETECTED Final   Candida glabrata NOT DETECTED NOT DETECTED Final   Candida krusei NOT DETECTED NOT DETECTED Final   Candida parapsilosis NOT DETECTED NOT DETECTED Final   Candida tropicalis NOT DETECTED NOT DETECTED Final    Comment: Performed at [facility], [place] [address], [place], [place] [address]  MRSA PCR Screening     Status: Abnormal   Collection Time: 03/31/18  7:03 PM  Result Value Ref Range Status   MRSA by PCR POSITIVE (A) NEGATIVE Final    Comment:        The GeneXpert MRSA Assay (FDA approved for NASAL specimens only), is one component of a comprehensive MRSA colonization surveillance program. It is not intended to diagnose MRSA infection nor to guide or monitor treatment for MRSA infections. RESULT CALLED TO, READ BACK BY AND VERIFIED WITH: H DADAMO RN 04/01/18 0044 JDW Performed at [facility], [place] [address], [place], [place] [address]   Culture, blood (routine x 2)     Status: None   Collection Time: 04/03/18  2:56 PM  Result Value Ref Range Status   Specimen  Description BLOOD RIGHT ANTECUBITAL  Final   Special Requests   Final    BOTTLES DRAWN AEROBIC AND ANAEROBIC Blood Culture adequate volume   Culture   Final    NO GROWTH 5 DAYS Performed at [facility], [place] [address], [place], [place] [address]    Report Status 04/08/2018 FINAL  Final  Culture, blood (routine x 2)     Status: None   Collection Time: 04/03/18  2:59 PM  Result Value Ref Range Status   Specimen Description BLOOD RIGHT HAND  Final   Special Requests   Final    BOTTLES DRAWN AEROBIC ONLY Blood Culture adequate volume   Culture   Final    NO GROWTH 5 DAYS Performed at [facility], [place] [address], [place], [place] [address]    Report Status 04/08/2018 FINAL  Final    Radiology Reports Ct Head Wo Contrast  Result Date: 04/01/2018 CLINICAL DATA:  72 y/o F; patient admitted with altered mental status, speech alteration, and respiratory distress. EXAM: CT HEAD WITHOUT CONTRAST TECHNIQUE: Contiguous axial images were obtained from the base of the skull through the vertex without intravenous contrast. COMPARISON:  06/22/2017 CT head. FINDINGS: Brain: No evidence of acute infarction, hemorrhage, hydrocephalus, extra-axial collection or mass lesion/mass effect. Stable mild chronic microvascular ischemic changes and parenchymal volume loss of the brain. Vascular: Calcific atherosclerosis of the carotid siphons. Skull: Normal. Negative for fracture or focal lesion. Sinuses/Orbits: Chronic right floor of orbit fracture and opacification of the right maxillary sinus. Nasoenteric tube. Other: None. IMPRESSION: 1. No acute intracranial abnormality identified. 2. Stable mild chronic microvascular ischemic changes and parenchymal volume loss of the brain for age. 3. Chronic right floor of orbit fracture and opacification of the right maxillary sinus. Electronically Signed   By: Kristine Garbe M.D.   On: 04/01/2018 01:35   Dg Chest Port 1 View  Result Date:  04/07/2018 CLINICAL DATA:  Shortness of breath EXAM: PORTABLE CHEST 1 VIEW COMPARISON:  04/06/2018 FINDINGS: Cardiac shadow is enlarged but stable. Endotracheal tube and nasogastric catheter have been  removed in the interval. Left jugular central line is again seen and stable. The lungs are well aerated bilaterally. Significant improved aeration in the bases is noted. No bony abnormality is seen. IMPRESSION: Improved aeration from the prior exam. Electronically Signed   By: Inez Catalina M.D.   On: 04/07/2018 07:50   Dg Chest Port 1 View  Result Date: 04/06/2018 CLINICAL DATA:  Acute respiratory failure.  Hypoxemia. EXAM: PORTABLE CHEST 1 VIEW COMPARISON:  April 05, 2018 FINDINGS: The ETT is in good position. The NG tube terminates below today's film. A left central line is in stable position. No pneumothorax. Opacity in left base is more pronounced in the interval. Mild opacity in the right base is also more pronounced. IMPRESSION: 1. Support apparatus as above. 2. Bibasilar opacities, worsened in the interval. Electronically Signed   By: Dorise Bullion III M.D   On: 04/06/2018 07:21   Dg Chest Port 1 View  Result Date: 04/05/2018 CLINICAL DATA:  Followup ventilated patient. EXAM: PORTABLE CHEST 1 VIEW COMPARISON:  04/04/2018 and multiple prior studies. FINDINGS: Lung volumes remain low. There is vascular congestion and by basilar lung opacity, the latter finding consistent with atelectasis and small effusions. No convincing pulmonary edema. Cardiac silhouette is mildly enlarged. Endotracheal tube, left internal jugular central venous line and nasogastric tube are stable. IMPRESSION: 1. No significant change from the most recent prior exam. 2. Persistent basilar opacities consistent with a combination of small effusions and atelectasis. This is greater on the left. There is vascular congestion without convincing pulmonary edema. 3. Support apparatus is stable. Electronically Signed   By: Lajean Manes M.D.    On: 04/05/2018 09:30   Dg Chest Port 1 View  Result Date: 04/04/2018 CLINICAL DATA:  Respiratory failure. EXAM: PORTABLE CHEST 1 VIEW COMPARISON:  04/03/2018 FINDINGS: Endotracheal tube has tip 2.1 cm above the carina. Left IJ central venous catheter unchanged with tip over the SVC. Nasogastric tube is present with tip over the distal stomach in the midline and side-port over the stomach in the left upper quadrant. Lungs are adequately inflated with interval improvement in previously noted left mid lung and bibasilar opacification likely improving interstitial edema. Persistent left base/retrocardiac opacification with obscuration of the left hemidiaphragm likely due to effusion and atelectasis although infection is possible. Stable cardiomegaly. Remainder of the exam is unchanged. IMPRESSION: Moderate interval improvement in left mid lung and bibasilar opacification likely improving interstitial edema. Persistent left base/retrocardiac opacification likely effusion with atelectasis although infection is possible. Tubes and lines as described. Electronically Signed   By: Marin Olp M.D.   On: 04/04/2018 09:04   Dg Chest Port 1 View  Result Date: 04/03/2018 CLINICAL DATA:  Respiratory failure. EXAM: PORTABLE CHEST 1 VIEW COMPARISON:  04/02/2018. FINDINGS: Endotracheal tube, NG tube left central line in stable position. Cardiomegaly again noted. Bibasilar and left mid lung field pulmonary infiltrates/edema again noted. Small pleural effusions again noted. No pneumothorax. IMPRESSION: 1.  Lines and tubes in stable position. 2. Cardiomegaly with bibasilar and left mid lung field pulmonary infiltrates/edema again noted. Small pleural effusions again noted. Similar findings on prior exam. No pneumothorax. Electronically Signed   By: Marcello Moores  Register   On: 04/03/2018 05:58   Dg Chest Portable 1 View  Result Date: 04/02/2018 CLINICAL DATA:  Hypoxia EXAM: PORTABLE CHEST 1 VIEW COMPARISON:  April 01, 2018  FINDINGS: Endotracheal tube tip is 2.6 cm above the carina. Nasogastric tube tip and side port below the diaphragm. Central catheter tip is in  the left innominate vein near the junction with the superior vena cava. No evident pneumothorax. There are pleural effusions bilaterally with patchy airspace consolidation in both lower lobe regions. No new opacity evident. There is stable cardiomegaly. The pulmonary vascularity appears within normal limits. No evident adenopathy. There is aortic atherosclerosis. No evident bone lesions. IMPRESSION: Tube and catheter positions as described. No pneumothorax. There remains cardiomegaly with pleural effusions. Airspace consolidation in the bases is concerning for pneumonia. There may be alveolar edema as well. There is aortic atherosclerosis. Aortic Atherosclerosis (ICD10-I70.0). Electronically Signed   By: Lowella Grip III M.D.   On: 04/02/2018 08:25   Dg Chest Port 1 View  Result Date: 04/01/2018 CLINICAL DATA:  Hypoxia EXAM: PORTABLE CHEST 1 VIEW COMPARISON:  March 31, 2018 FINDINGS: Endotracheal tube tip is 1.0 cm above the carina. Central catheter tip is in the left innominate vein near the junction with the superior vena cava. Nasogastric tube tip and side port are below the diaphragm with side port seen in the stomach. No pneumothorax. There is cardiomegaly with pulmonary vascularity normal. There is patchy airspace consolidation in both lower lobes with small pleural effusions bilaterally. No evident adenopathy. There is aortic atherosclerosis. There is degenerative change in each shoulder. IMPRESSION: Tube and catheter positions as described without pneumothorax. Note that the endotracheal tube tip is near the carina. It may be prudent to consider withdrawing endotracheal tube approximately 3 cm. Cardiomegaly with lower lobe consolidation and small pleural effusions. Suspect bibasilar pneumonia, although alveolar edema may be present in the lung bases as well.  There is aortic atherosclerosis. Aortic Atherosclerosis (ICD10-I70.0). Electronically Signed   By: Lowella Grip III M.D.   On: 04/01/2018 07:30   Portable Chest X-ray  Result Date: 03/31/2018 CLINICAL DATA:  72 year old female post endotracheal tube placement. Subsequent encounter. EXAM: PORTABLE CHEST 1 VIEW COMPARISON:  03/31/2018 1:02 p.m. FINDINGS: Endotracheal tube tip 1.8 cm above the carina. Left central line tip proximal superior vena cava level. Nasogastric tube courses below the diaphragm. Tip is not included on the present exam. No pneumothorax. Cardiomegaly. Persistent slightly asymmetric airspace disease suggestive of pulmonary edema. Calcified aorta. IMPRESSION: Endotracheal tube tip 1.8 cm above the carina. Left central line tip proximal superior vena cava level. No pneumothorax. Cardiomegaly. Persistent slightly asymmetric airspace disease suggestive of pulmonary edema. Aortic Atherosclerosis (ICD10-I70.0). Electronically Signed   By: Genia Del M.D.   On: 03/31/2018 19:46   Dg Chest Portable 1 View  Result Date: 03/31/2018 CLINICAL DATA:  Respiratory distress. History of chronic CHF, diabetes, chronic renal insufficiency, morbid obesity. EXAM: PORTABLE CHEST 1 VIEW COMPARISON:  Portable chest x-ray of May 28, 2017 FINDINGS: The lungs are well-expanded. The pulmonary vascularity is engorged and the interstitial markings are increased. There is a small left pleural effusion. The cardiac silhouette is enlarged. The trachea is deviated slightly to the right chronically. There is calcification in the wall of the aortic arch. IMPRESSION: CHF with pulmonary interstitial and early alveolar edema and small left pleural effusion. Thoracic aortic atherosclerosis. Electronically Signed   By: David  Martinique M.D.   On: 03/31/2018 13:14   Dg Abd Portable 1v  Result Date: 03/31/2018 CLINICAL DATA:  72 year old female with nasogastric tube placement. Initial encounter. EXAM: PORTABLE ABDOMEN -  1 VIEW COMPARISON:  Chest x-ray same date.  CT abdomen pelvis 05/29/2017. FINDINGS: Nasogastric tube tip gastric antrum level with side hole fundus-body junction level. Limited evaluation of bowel gas pattern without gross abnormality. The possibility of free intraperitoneal air  cannot be assessed on a supine view. IUD in place. IMPRESSION: Nasogastric tube tip gastric antrum level with side hole fundus-body junction level. Electronically Signed   By: Genia Del M.D.   On: 03/31/2018 19:48     CBC Recent Labs  Lab 04/03/18 0540 04/04/18 0419 04/05/18 0322 04/07/18 0351 04/08/18 0620 04/09/18 0458  WBC 5.8 5.2 5.5 6.1 5.9 5.8  HGB 9.2* 10.0* 10.3* 10.7* 10.8* 11.1*  HCT 32.3* 35.6* 36.3 38.4 38.2 39.0  PLT 204 206 216 222 235 268  MCV 88.3 89.0 89.0 88.1 88.0 88.0  MCH 25.1* 25.0* 25.2* 24.5* 24.9* 25.1*  MCHC 28.5* 28.1* 28.4* 27.9* 28.3* 28.5*  RDW 20.6* 20.1* 20.0* 19.5* 19.2* 18.9*  LYMPHSABS 0.9  --   --   --   --   --   MONOABS 1.0  --   --   --   --   --   EOSABS 0.4  --   --   --   --   --   BASOSABS 0.0  --   --   --   --   --     Chemistries  Recent Labs  Lab 04/02/18 1654 04/03/18 0540  04/05/18 0322 04/06/18 0353 04/07/18 0351 04/08/18 0620 04/09/18 0458  NA  --  149*   < > 149* 149* 145 143 141  K  --  3.6   < > 3.3* 3.4* 3.8 4.4 4.5  CL  --  105   < > 96* 96* 96* 96* 95*  CO2  --  36*   < > 45* 42* 40* 38* 38*  GLUCOSE  --  126*   < > 188* 187* 152* 183* 130*  BUN  --  51*   < > 42* 39* 32* 30* 31*  CREATININE  --  2.25*   < > 1.50* 1.46* 1.40* 1.40* 1.50*  CALCIUM  --  8.5*   < > 9.1 9.2 9.2 9.2 9.0  MG 2.0 2.2  --  1.8  --   --   --   --   AST  --   --   --   --   --  17  --  18  ALT  --   --   --   --   --  <5  --  5  ALKPHOS  --   --   --   --   --  40  --  47  BILITOT  --   --   --   --   --  0.7  --  0.7   < > = values in this interval not displayed.    ------------------------------------------------------------------------------------------------------------------ estimated creatinine clearance is 39.5 mL/min (A) (by C-G formula based on SCr of 1.5 mg/dL (H)). ------------------------------------------------------------------------------------------------------------------ No results for input(s): HGBA1C in the last 72 hours. ------------------------------------------------------------------------------------------------------------------ No results for input(s): CHOL, HDL, LDLCALC, TRIG, CHOLHDL, LDLDIRECT in the last 72 hours. ------------------------------------------------------------------------------------------------------------------ No results for input(s): TSH, T4TOTAL, T3FREE, THYROIDAB in the last 72 hours.  Invalid input(s): FREET3 ------------------------------------------------------------------------------------------------------------------ No results for input(s): VITAMINB12, FOLATE, FERRITIN, TIBC, IRON, RETICCTPCT in the last 72 hours.  Coagulation profile No results for input(s): INR, PROTIME in the last 168 hours.  No results for input(s): DDIMER in the last 72 hours.  Cardiac Enzymes No results for input(s): CKMB, TROPONINI, MYOGLOBIN in the last 168 hours.  Invalid input(s): CK ------------------------------------------------------------------------------------------------------------------ Invalid input(s): POCBNP   CBG: Recent Labs  Lab 04/08/18 1641 04/08/18 2115 04/09/18 0032  04/09/18 0601 04/09/18 0811  GLUCAP 199* 168* 134* 124* 126*       Studies: No results found.    Lab Results  Component Value Date   HGBA1C 7.0 11/27/2016   HGBA1C 7.2 06/26/2016   HGBA1C 10.1 04/03/2016   Lab Results  Component Value Date   MICROALBUR 4.2 02/01/2017   LDLCALC 111 02/01/2017   CREATININE 1.50 (H) 04/09/2018       Scheduled Meds: . amLODipine  5 mg Oral Daily  . arformoterol  15 mcg  Nebulization BID  . aspirin  81 mg Oral Daily  . budesonide (PULMICORT) nebulizer solution  0.5 mg Nebulization BID  . DULoxetine  30 mg Oral BID  . famotidine  40 mg Oral QHS  . feeding supplement (GLUCERNA SHAKE)  237 mL Oral TID WC  . furosemide  80 mg Intravenous Q12H  . heparin  5,000 Units Subcutaneous Q8H  . insulin aspart  0-20 Units Subcutaneous BID AC  . ipratropium-albuterol  3 mL Nebulization Q6H  . potassium chloride  40 mEq Oral BID  . senna  1 tablet Oral QHS   Continuous Infusions: . dextrose Stopped (04/07/18 1338)     LOS: 9 days    Time spent: >30 MINS    Reyne Dumas  [facility] Pager [phone]. If 7PM-7AM, please contact night-coverage at [email or web address], password [facility] 04/09/2018, 9:05 AM  LOS: 9 days

## 2018-04-09 NOTE — Progress Notes (Signed)
Pt requesting to come off of CPAP at this time. Pt finishing scheduled neb tx, then placed on 4L [place]. RT will continue to monitor.

## 2018-04-10 LAB — GLUCOSE, CAPILLARY
Glucose-Capillary: 128 mg/dL — ABNORMAL HIGH (ref 70–99)
Glucose-Capillary: 214 mg/dL — ABNORMAL HIGH (ref 70–99)
Glucose-Capillary: 180 mg/dL — ABNORMAL HIGH (ref 70–99)
Glucose-Capillary: 183 mg/dL — ABNORMAL HIGH (ref 70–99)

## 2018-04-10 LAB — COMPREHENSIVE METABOLIC PANEL
ALBUMIN: 2.8 g/dL — AB (ref 3.5–5.0)
ALK PHOS: 55 U/L (ref 38–126)
ALT: 8 U/L (ref 0–44)
AST: 22 U/L (ref 15–41)
Anion gap: 7 (ref 5–15)
BUN: 31 mg/dL — AB (ref 8–23)
CALCIUM: 9.1 mg/dL (ref 8.9–10.3)
CHLORIDE: 93 mmol/L — AB (ref 98–111)
CO2: 39 mmol/L — AB (ref 22–32)
Creatinine, Ser: 1.66 mg/dL — ABNORMAL HIGH (ref 0.44–1.00)
GFR calc non Af Amer: 30 mL/min — ABNORMAL LOW (ref >60)
GFR, EST AFRICAN AMERICAN: 34 mL/min — AB (ref >60)
GLUCOSE: 131 mg/dL — AB (ref 70–99)
Potassium: 4.6 mmol/L (ref 3.5–5.1)
SODIUM: 139 mmol/L (ref 135–145)
Total Bilirubin: 0.7 mg/dL (ref 0.3–1.2)
Total Protein: 8.1 g/dL (ref 6.5–8.1)

## 2018-04-10 MED ORDER — FUROSEMIDE 40 MG PO TABS: 40.0000 mg | ORAL_TABLET | Freq: Every day | ORAL | 2 refills | Status: DC

## 2018-04-10 MED ORDER — POTASSIUM CHLORIDE ER 20 MEQ PO TBCR: 20.0000 meq | EXTENDED_RELEASE_TABLET | ORAL | 1 refills | Status: DC

## 2018-04-10 MED ORDER — FUROSEMIDE 20 MG PO TABS: 20.0000 mg | ORAL_TABLET | Freq: Every evening | ORAL | 1 refills | Status: DC

## 2018-04-10 MED ORDER — INSULIN ASPART 100 UNIT/ML ~~LOC~~ SOLN: 0.0000 [IU] | Freq: Two times a day (BID) | SUBCUTANEOUS | 11 refills | Status: DC

## 2018-04-10 MED ORDER — SENNOSIDES-DOCUSATE SODIUM 8.6-50 MG PO TABS: 2.0000 | ORAL_TABLET | Freq: Every day | ORAL | 1 refills | Status: AC

## 2018-04-10 MED ORDER — TRAMADOL HCL 50 MG PO TABS: 50.0000 mg | ORAL_TABLET | Freq: Four times a day (QID) | ORAL | 0 refills | Status: DC | PRN

## 2018-04-10 NOTE — Discharge Instructions (Signed)
1)Very low-salt diet advised 2)Weigh yourself daily, call if you gain more than 3 pounds in 1 day or more than 5 pounds in 1 week as your diuretic medications may need to be adjusted 3)Limit your Fluid  intake to no more than 60 ounces (1.8 Liters) per day 4)Use BiPAP nightly and as needed--- IPAP =14 and EPAP = 6 5)Repeat BMP in 4 days

## 2018-04-10 NOTE — Progress Notes (Signed)
Pt on auto bipap on dreamstation machine for the night- This would be the type of machine that the patient could go home with.  Pt has 3lpm oxygen bled into circuit.

## 2018-04-10 NOTE — Progress Notes (Signed)
Equipment company delivered the wrong machine to [name], so Bipap will arrive tomorrow.   Heather Locus Deontaye Civello LCSW [phone]

## 2018-04-10 NOTE — Progress Notes (Signed)
Patient Demographics:    Heather Zimmerman, is a 72 y.o. female, DOB - November 13, 1945, GEZ:[id number]  Admit date - 03/31/2018   Admitting Physician Lahoma Rocker, MD  Outpatient Primary MD for the patient is Patient, No Pcp Per  LOS - 10   Chief Complaint  Patient presents with  . Altered Mental Status  . Respiratory Distress        Subjective:    Heather Zimmerman today has no fevers, no emesis,  No chest pain,  Eating well, tolerated BiPAP well  Assessment  & Plan :    Active Problems:   Acute respiratory failure with hypoxia and hypercapnia (HCC)   Endotracheal tube present   Pulmonary edema   Brief summary:- 72 y/o female OSA, CKD, HTN, presented with acute on chronic hypercapnic and hypoxic respiratory failure due to CHF exacerbation on 03/31/18 . initially unresponsive managed on BiPAP, prior history of severe OSA and multiple admissions, She was reportedly seen early am and in usual state of health. She apparently went to speech therapy and after completion, she was noted to be dyspneic, non-verbal and altered. She was put on a NRB for hypoxia. Sats were in the 60's. The patient was given narcan without significant response. She was transported to ED where she remained altered and hypoxic.  On admission found to be hypercarbic with a pH of 7.17, PCO2 greater than 100. Initial chest x-ray showed CHF with pulmonary interstitial and early alveolar edema. Also deemed to be septic and started on vancomycin and Zosyn. Patient subsequently intubated, extubated 04/06/18, transferred to [facility] 04/07/18. was on 6 L of oxygen, weaned down to 1 to 2 Liters of Oxygen . Blood culture on 6/18 positive for gram-positive cocci/MSSA (X2), treated with Ancef.  Discharge to skilled nursing facility when BiPAP machine is available, Use BiPAP nightly and as needed--- IPAP =14 and EPAP = 6   plan  1)Acute on chronic hypoxic  and hypercapnic respiratory failure  in setting of acute pulmonary edema/acute on chronic diastolic heart failure AND OSA---  Clinically and radiologically much improved after IV diuresis, and antibiotics Successfully extubated on 04/06/09, Use BiPAP nightly and as needed--- IPAP =14 and EPAP = 6 Currently> 9  L negative  Currently oxygen requirement is back to her baseline which is about 2 L, Patient's PCO2 chronically elevated - Use BiPAP nightly and as needed--- IPAP =14 and EPAP = 6  . I think her baseline PCO2 is about 60 Limit narcotics and benzos, Continue bronchodilators   Discharge to skilled nursing facility when BiPAP machine is available, Use BiPAP nightly and as needed--- IPAP =14 and EPAP = 6  2)Acute on chronic diastolic heart failure, evidence of cor pulmonale with mildly reduced right ventricular function Continue telemetry Continue diuresis, Weight 278>255 pounds  Continuenorvasc Continue IV diuretics, follow renal function closely  3)Coag Negative Staph Bacteremia -Etiology unclear; had 2 out of 2 cultures positive drawn from left and right antecubitals. She does have diabetic ulcer on her lower extremity this may be the culprit  DC Ancef,  completed 8 days antibiotics  4)AKI----acute kidney injury on CKD stage - III     creatinine on admission= 1.67 ,   baseline creatinine = 1.3   , creatinine is now= 1.6 (  peak was 2.56)     ,  Avoid nephrotoxic agents/dehydration/hypotension   5)Morbid obesity -this complicates overall care    6) chronic microcytic and hypochromic --- hemoglobin remains stable above 10, no evidence of ongoing bleeding   7)DM2 with hyperglycemia- Use Novolog/Humalog Sliding scale insulin with Accu-Cheks/Fingersticks as ordered   DVT prophylaxsis heparin  Code Status:Full code  Family Communication:Discussed   with the patient,    Disposition Plan:Anticipate discharge to  SNF when  BiPAP is set up/ Discharge to skilled  nursing facility when BiPAP machine is available, Use BiPAP nightly and as needed--- IPAP =14 and EPAP = 6   Consultants:  PCCM  Procedures: 03/31/2018 endotracheal tube>>6/24  CULTURES: Blood 6/18 > gram-positive cocci/MSSA (X2) Sputum 6/18 > not done Blood 6/21>>  CXR 6/21 > continued mild interstitial edema. 2D echo 04/01/2018: EF 60 to 35% grade 1 diastolic dysfunction right ventricle systolic function was mildly reduced.   Code Status : FULL   Disposition Plan  :  SNF when BiPAP is available   DVT Prophylaxis  :    Heparin    Lab Results  Component Value Date   PLT 268 04/09/2018    Inpatient Medications  Scheduled Meds: . amLODipine  5 mg Oral Daily  . arformoterol  15 mcg Nebulization BID  . aspirin  81 mg Oral Daily  . budesonide (PULMICORT) nebulizer solution  0.5 mg Nebulization BID  . DULoxetine  30 mg Oral BID  . famotidine  40 mg Oral QHS  . feeding supplement (GLUCERNA SHAKE)  237 mL Oral TID WC  . furosemide  80 mg Intravenous Q12H  . heparin  5,000 Units Subcutaneous Q8H  . insulin aspart  0-20 Units Subcutaneous BID AC  . potassium chloride  40 mEq Oral BID  . senna  1 tablet Oral QHS   Continuous Infusions: . dextrose Stopped (04/07/18 1338)   PRN Meds:.acetaminophen, bisacodyl, ipratropium-albuterol    Anti-infectives (From admission, onward)   Start     Dose/Rate Route Frequency Ordered Stop   04/04/18 1800  ceFAZolin (ANCEF) IVPB 2g/100 mL premix  Status:  Discontinued     2 g 200 mL/hr over 30 Minutes Intravenous Every 8 hours 04/04/18 1231 04/07/18 0837   04/02/18 2200  ceFAZolin (ANCEF) IVPB 2g/100 mL premix  Status:  Discontinued     2 g 200 mL/hr over 30 Minutes Intravenous Every 12 hours 04/02/18 1116 04/04/18 1231   04/01/18 1700  vancomycin (VANCOCIN) 1,500 mg in sodium chloride 0.9 % 500 mL IVPB  Status:  Discontinued     1,500 mg 250 mL/hr over 120 Minutes Intravenous Every 24 hours 03/31/18 1611 04/01/18 1541    04/01/18 1630  ceFAZolin (ANCEF) IVPB 2g/100 mL premix  Status:  Discontinued     2 g 200 mL/hr over 30 Minutes Intravenous Every 8 hours 04/01/18 1541 04/02/18 1116   03/31/18 2300  piperacillin-tazobactam (ZOSYN) IVPB 3.375 g  Status:  Discontinued     3.375 g 12.5 mL/hr over 240 Minutes Intravenous Every 8 hours 03/31/18 1611 04/01/18 1541   03/31/18 1700  vancomycin (VANCOCIN) 2,500 mg in sodium chloride 0.9 % 500 mL IVPB  Status:  Discontinued     2,500 mg 250 mL/hr over 120 Minutes Intravenous  Once 03/31/18 1611 04/01/18 1541   03/31/18 1630  piperacillin-tazobactam (ZOSYN) IVPB 3.375 g     3.375 g 100 mL/hr over 30 Minutes Intravenous  Once 03/31/18 1611 03/31/18 1712        Objective:  Vitals:   04/10/18 0952 04/10/18 1003 04/10/18 1225 04/10/18 1407  BP: 113/66   124/77  Pulse: 80   78  Resp: 17   20  Temp:   98.3 F (36.8 C)   TempSrc:   Oral   SpO2: 96%   93%  Weight:  113.9 kg (251 lb 1.7 oz)    Height:        Wt Readings from Last 3 Encounters:  04/10/18 113.9 kg (251 lb 1.7 oz)  10/29/17 131.5 kg (290 lb)  05/31/17 131.8 kg (290 lb 9.1 oz)     Intake/Output Summary (Last 24 hours) at 04/10/2018 1649 Last data filed at 04/10/2018 1300 Gross per 24 hour  Intake 920 ml  Output 700 ml  Net 220 ml     Physical Exam Gen:- Awake Alert,  Obese, in no apparent distress  HEENT:- [place].AT, No sclera icterus Nose- [place] 2 L/min Neck-Supple Neck,No JVD,.  Lungs-  CTAB , fair air movement CV- S1, S2 normal Abd-  +ve B.Sounds, Abd Soft, No tenderness,    Extremity/Skin:-Rt BKA Psych-affect is appropriate, oriented x3 Neuro-no new focal deficits, no tremors   Data Review:   Micro Results Recent Results (from the past 240 hour(s))  MRSA PCR Screening     Status: Abnormal   Collection Time: 03/31/18  7:03 PM  Result Value Ref Range Status   MRSA by PCR POSITIVE (A) NEGATIVE Final    Comment:        The GeneXpert MRSA Assay (FDA approved for NASAL  specimens only), is one component of a comprehensive MRSA colonization surveillance program. It is not intended to diagnose MRSA infection nor to guide or monitor treatment for MRSA infections. RESULT CALLED TO, READ BACK BY AND VERIFIED WITH: H DADAMO RN 04/01/18 0044 JDW Performed at [facility], [place] [address], [place], [place] [address]   Culture, blood (routine x 2)     Status: None   Collection Time: 04/03/18  2:56 PM  Result Value Ref Range Status   Specimen Description BLOOD RIGHT ANTECUBITAL  Final   Special Requests   Final    BOTTLES DRAWN AEROBIC AND ANAEROBIC Blood Culture adequate volume   Culture   Final    NO GROWTH 5 DAYS Performed at [facility], [place] [address] [place], [place] [address]    Report Status 04/08/2018 FINAL  Final  Culture, blood (routine x 2)     Status: None   Collection Time: 04/03/18  2:59 PM  Result Value Ref Range Status   Specimen Description BLOOD RIGHT HAND  Final   Special Requests   Final    BOTTLES DRAWN AEROBIC ONLY Blood Culture adequate volume   Culture   Final    NO GROWTH 5 DAYS Performed at [facility], [place] [address], [place], [place] [address]    Report Status 04/08/2018 FINAL  Final    Radiology Reports Ct Head Wo Contrast  Result Date: 04/01/2018 CLINICAL DATA:  72 y/o F; patient admitted with altered mental status, speech alteration, and respiratory distress. EXAM: CT HEAD WITHOUT CONTRAST TECHNIQUE: Contiguous axial images were obtained from the base of the skull through the vertex without intravenous contrast. COMPARISON:  06/22/2017 CT head. FINDINGS: Brain: No evidence of acute infarction, hemorrhage, hydrocephalus, extra-axial collection or mass lesion/mass effect. Stable mild chronic microvascular ischemic changes and parenchymal volume loss of the brain. Vascular: Calcific atherosclerosis of the carotid siphons. Skull: Normal. Negative for fracture or focal lesion.  Sinuses/Orbits: Chronic right  floor of orbit fracture and opacification of the right maxillary sinus. Nasoenteric tube. Other: None. IMPRESSION: 1. No acute intracranial abnormality identified. 2. Stable mild chronic microvascular ischemic changes and parenchymal volume loss of the brain for age. 3. Chronic right floor of orbit fracture and opacification of the right maxillary sinus. Electronically Signed   By: Kristine Garbe M.D.   On: 04/01/2018 01:35   Dg Chest Port 1 View  Result Date: 04/07/2018 CLINICAL DATA:  Shortness of breath EXAM: PORTABLE CHEST 1 VIEW COMPARISON:  04/06/2018 FINDINGS: Cardiac shadow is enlarged but stable. Endotracheal tube and nasogastric catheter have been removed in the interval. Left jugular central line is again seen and stable. The lungs are well aerated bilaterally. Significant improved aeration in the bases is noted. No bony abnormality is seen. IMPRESSION: Improved aeration from the prior exam. Electronically Signed   By: Inez Catalina M.D.   On: 04/07/2018 07:50   Dg Chest Port 1 View  Result Date: 04/06/2018 CLINICAL DATA:  Acute respiratory failure.  Hypoxemia. EXAM: PORTABLE CHEST 1 VIEW COMPARISON:  April 05, 2018 FINDINGS: The ETT is in good position. The NG tube terminates below today's film. A left central line is in stable position. No pneumothorax. Opacity in left base is more pronounced in the interval. Mild opacity in the right base is also more pronounced. IMPRESSION: 1. Support apparatus as above. 2. Bibasilar opacities, worsened in the interval. Electronically Signed   By: Dorise Bullion III M.D   On: 04/06/2018 07:21   Dg Chest Port 1 View  Result Date: 04/05/2018 CLINICAL DATA:  Followup ventilated patient. EXAM: PORTABLE CHEST 1 VIEW COMPARISON:  04/04/2018 and multiple prior studies. FINDINGS: Lung volumes remain low. There is vascular congestion and by basilar lung opacity, the latter finding consistent with atelectasis and small effusions. No convincing pulmonary  edema. Cardiac silhouette is mildly enlarged. Endotracheal tube, left internal jugular central venous line and nasogastric tube are stable. IMPRESSION: 1. No significant change from the most recent prior exam. 2. Persistent basilar opacities consistent with a combination of small effusions and atelectasis. This is greater on the left. There is vascular congestion without convincing pulmonary edema. 3. Support apparatus is stable. Electronically Signed   By: Lajean Manes M.D.   On: 04/05/2018 09:30   Dg Chest Port 1 View  Result Date: 04/04/2018 CLINICAL DATA:  Respiratory failure. EXAM: PORTABLE CHEST 1 VIEW COMPARISON:  04/03/2018 FINDINGS: Endotracheal tube has tip 2.1 cm above the carina. Left IJ central venous catheter unchanged with tip over the SVC. Nasogastric tube is present with tip over the distal stomach in the midline and side-port over the stomach in the left upper quadrant. Lungs are adequately inflated with interval improvement in previously noted left mid lung and bibasilar opacification likely improving interstitial edema. Persistent left base/retrocardiac opacification with obscuration of the left hemidiaphragm likely due to effusion and atelectasis although infection is possible. Stable cardiomegaly. Remainder of the exam is unchanged. IMPRESSION: Moderate interval improvement in left mid lung and bibasilar opacification likely improving interstitial edema. Persistent left base/retrocardiac opacification likely effusion with atelectasis although infection is possible. Tubes and lines as described. Electronically Signed   By: Marin Olp M.D.   On: 04/04/2018 09:04   Dg Chest Port 1 View  Result Date: 04/03/2018 CLINICAL DATA:  Respiratory failure. EXAM: PORTABLE CHEST 1 VIEW COMPARISON:  04/02/2018. FINDINGS: Endotracheal tube, NG tube left central line in stable position. Cardiomegaly again noted. Bibasilar and left mid lung  field pulmonary infiltrates/edema again noted. Small pleural  effusions again noted. No pneumothorax. IMPRESSION: 1.  Lines and tubes in stable position. 2. Cardiomegaly with bibasilar and left mid lung field pulmonary infiltrates/edema again noted. Small pleural effusions again noted. Similar findings on prior exam. No pneumothorax. Electronically Signed   By: Marcello Moores  Register   On: 04/03/2018 05:58   Dg Chest Portable 1 View  Result Date: 04/02/2018 CLINICAL DATA:  Hypoxia EXAM: PORTABLE CHEST 1 VIEW COMPARISON:  April 01, 2018 FINDINGS: Endotracheal tube tip is 2.6 cm above the carina. Nasogastric tube tip and side port below the diaphragm. Central catheter tip is in the left innominate vein near the junction with the superior vena cava. No evident pneumothorax. There are pleural effusions bilaterally with patchy airspace consolidation in both lower lobe regions. No new opacity evident. There is stable cardiomegaly. The pulmonary vascularity appears within normal limits. No evident adenopathy. There is aortic atherosclerosis. No evident bone lesions. IMPRESSION: Tube and catheter positions as described. No pneumothorax. There remains cardiomegaly with pleural effusions. Airspace consolidation in the bases is concerning for pneumonia. There may be alveolar edema as well. There is aortic atherosclerosis. Aortic Atherosclerosis (ICD10-I70.0). Electronically Signed   By: Lowella Grip III M.D.   On: 04/02/2018 08:25   Dg Chest Port 1 View  Result Date: 04/01/2018 CLINICAL DATA:  Hypoxia EXAM: PORTABLE CHEST 1 VIEW COMPARISON:  March 31, 2018 FINDINGS: Endotracheal tube tip is 1.0 cm above the carina. Central catheter tip is in the left innominate vein near the junction with the superior vena cava. Nasogastric tube tip and side port are below the diaphragm with side port seen in the stomach. No pneumothorax. There is cardiomegaly with pulmonary vascularity normal. There is patchy airspace consolidation in both lower lobes with small pleural effusions bilaterally. No  evident adenopathy. There is aortic atherosclerosis. There is degenerative change in each shoulder. IMPRESSION: Tube and catheter positions as described without pneumothorax. Note that the endotracheal tube tip is near the carina. It may be prudent to consider withdrawing endotracheal tube approximately 3 cm. Cardiomegaly with lower lobe consolidation and small pleural effusions. Suspect bibasilar pneumonia, although alveolar edema may be present in the lung bases as well. There is aortic atherosclerosis. Aortic Atherosclerosis (ICD10-I70.0). Electronically Signed   By: Lowella Grip III M.D.   On: 04/01/2018 07:30   Portable Chest X-ray  Result Date: 03/31/2018 CLINICAL DATA:  72 year old female post endotracheal tube placement. Subsequent encounter. EXAM: PORTABLE CHEST 1 VIEW COMPARISON:  03/31/2018 1:02 p.m. FINDINGS: Endotracheal tube tip 1.8 cm above the carina. Left central line tip proximal superior vena cava level. Nasogastric tube courses below the diaphragm. Tip is not included on the present exam. No pneumothorax. Cardiomegaly. Persistent slightly asymmetric airspace disease suggestive of pulmonary edema. Calcified aorta. IMPRESSION: Endotracheal tube tip 1.8 cm above the carina. Left central line tip proximal superior vena cava level. No pneumothorax. Cardiomegaly. Persistent slightly asymmetric airspace disease suggestive of pulmonary edema. Aortic Atherosclerosis (ICD10-I70.0). Electronically Signed   By: Genia Del M.D.   On: 03/31/2018 19:46   Dg Chest Portable 1 View  Result Date: 03/31/2018 CLINICAL DATA:  Respiratory distress. History of chronic CHF, diabetes, chronic renal insufficiency, morbid obesity. EXAM: PORTABLE CHEST 1 VIEW COMPARISON:  Portable chest x-ray of May 28, 2017 FINDINGS: The lungs are well-expanded. The pulmonary vascularity is engorged and the interstitial markings are increased. There is a small left pleural effusion. The cardiac silhouette is enlarged. The  trachea is deviated slightly to the  right chronically. There is calcification in the wall of the aortic arch. IMPRESSION: CHF with pulmonary interstitial and early alveolar edema and small left pleural effusion. Thoracic aortic atherosclerosis. Electronically Signed   By: David  Martinique M.D.   On: 03/31/2018 13:14   Dg Abd Portable 1v  Result Date: 03/31/2018 CLINICAL DATA:  72 year old female with nasogastric tube placement. Initial encounter. EXAM: PORTABLE ABDOMEN - 1 VIEW COMPARISON:  Chest x-ray same date.  CT abdomen pelvis 05/29/2017. FINDINGS: Nasogastric tube tip gastric antrum level with side hole fundus-body junction level. Limited evaluation of bowel gas pattern without gross abnormality. The possibility of free intraperitoneal air cannot be assessed on a supine view. IUD in place. IMPRESSION: Nasogastric tube tip gastric antrum level with side hole fundus-body junction level. Electronically Signed   By: Genia Del M.D.   On: 03/31/2018 19:48     CBC Recent Labs  Lab 04/04/18 0419 04/05/18 0322 04/07/18 0351 04/08/18 0620 04/09/18 0458  WBC 5.2 5.5 6.1 5.9 5.8  HGB 10.0* 10.3* 10.7* 10.8* 11.1*  HCT 35.6* 36.3 38.4 38.2 39.0  PLT 206 216 222 235 268  MCV 89.0 89.0 88.1 88.0 88.0  MCH 25.0* 25.2* 24.5* 24.9* 25.1*  MCHC 28.1* 28.4* 27.9* 28.3* 28.5*  RDW 20.1* 20.0* 19.5* 19.2* 18.9*    Chemistries  Recent Labs  Lab 04/05/18 0322 04/06/18 0353 04/07/18 0351 04/08/18 0620 04/09/18 0458 04/10/18 0622  NA 149* 149* 145 143 141 139  K 3.3* 3.4* 3.8 4.4 4.5 4.6  CL 96* 96* 96* 96* 95* 93*  CO2 45* 42* 40* 38* 38* 39*  GLUCOSE 188* 187* 152* 183* 130* 131*  BUN 42* 39* 32* 30* 31* 31*  CREATININE 1.50* 1.46* 1.40* 1.40* 1.50* 1.66*  CALCIUM 9.1 9.2 9.2 9.2 9.0 9.1  MG 1.8  --   --   --   --   --   AST  --   --  17  --  18 22  ALT  --   --  <5  --  5 8  ALKPHOS  --   --  40  --  47 55  BILITOT  --   --  0.7  --  0.7 0.7    ------------------------------------------------------------------------------------------------------------------ No results for input(s): CHOL, HDL, LDLCALC, TRIG, CHOLHDL, LDLDIRECT in the last 72 hours.  Lab Results  Component Value Date   HGBA1C 7.0 11/27/2016   ------------------------------------------------------------------------------------------------------------------ No results for input(s): TSH, T4TOTAL, T3FREE, THYROIDAB in the last 72 hours.  Invalid input(s): FREET3 ------------------------------------------------------------------------------------------------------------------ No results for input(s): VITAMINB12, FOLATE, FERRITIN, TIBC, IRON, RETICCTPCT in the last 72 hours.  Coagulation profile No results for input(s): INR, PROTIME in the last 168 hours.  No results for input(s): DDIMER in the last 72 hours.  Cardiac Enzymes No results for input(s): CKMB, TROPONINI, MYOGLOBIN in the last 168 hours.  Invalid input(s): CK ------------------------------------------------------------------------------------------------------------------    Component Value Date/Time   BNP 18.3 04/02/2018 0629     Roxan Hockey M.D on 04/10/2018 at 4:49 PM  Between 7am to 7pm - Pager - [phone]  After 7pm go to [email or web address] - password [email or web address]  [facility] -  Office  [phone]   Voice Recognition Viviann Spare dictation system was used to create this note, attempts have been made to correct errors. Please contact the author with questions and/or clarifications.

## 2018-04-10 NOTE — Discharge Summary (Addendum)
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 oriented x3 Neuro-no new focal deficits, no tremors   Data Review   CBC w Diff:  Lab Results  Component Value Date   WBC 5.8 04/09/2018   HGB 11.1 (L) 04/09/2018   HCT 39.0 04/09/2018   HCT 39.7 12/29/2016   PLT 268 04/09/2018   LYMPHOPCT 15 04/03/2018   BANDSPCT 0 04/28/2017   MONOPCT 17 04/03/2018   EOSPCT 7 04/03/2018   BASOPCT 0 04/03/2018    CMP:  Lab Results  Component Value Date   NA 139 04/10/2018   NA 141 11/22/2016   K 4.6 04/10/2018   CL 93 (L) 04/10/2018   CO2 39 (H) 04/10/2018   BUN 31 (H) 04/10/2018   BUN 35 (A) 11/22/2016   CREATININE 1.66 (H) 04/10/2018   CREATININE 1.40 (H) 11/05/2014   GLU 103 11/22/2016   PROT 8.1 04/10/2018   ALBUMIN 2.8 (L) 04/10/2018   BILITOT 0.7 04/10/2018   ALKPHOS 55 04/10/2018   AST 22 04/10/2018   ALT 8 04/10/2018     Total Discharge time  is about 33 minutes  Roxan Hockey M.D on 04/11/2018 at 11:06 AM  [facility]   Office  [phone]  Voice Recognition Viviann Spare dictation system was used to create this note, attempts have been made to correct errors. Please contact the author with questions and/or clarifications.

## 2018-04-11 LAB — GLUCOSE, CAPILLARY
GLUCOSE-CAPILLARY: 129 mg/dL — AB (ref 70–99)
GLUCOSE-CAPILLARY: 210 mg/dL — AB (ref 70–99)
Glucose-Capillary: 200 mg/dL — ABNORMAL HIGH (ref 70–99)
Glucose-Capillary: 225 mg/dL — ABNORMAL HIGH (ref 70–99)
Glucose-Capillary: 198 mg/dL — ABNORMAL HIGH (ref 70–99)

## 2018-04-11 MED ORDER — FUROSEMIDE 20 MG PO TABS: 20.0000 mg | ORAL_TABLET | Freq: Every evening | ORAL | 1 refills | Status: DC

## 2018-04-11 MED ORDER — AMLODIPINE BESYLATE 2.5 MG PO TABS: 2.5000 mg | ORAL_TABLET | Freq: Every day | ORAL | 2 refills | Status: DC

## 2018-04-11 NOTE — Progress Notes (Deleted)
Patient will Discharge To: [facility] Anticipated DC Date:04/11/18 Family Notified:yes Rose Heart, sister, [phone] Transport By: Corey Harold   Per MD patient ready for DC to [facility] . RN, patient, patient's family, and facility notified of DC. Assessment, Fl2/Pasrr, and Discharge Summary sent to facility. RN given number for report [phone], Pt room # 131). DC packet on chart. Ambulance transport requested for patient.   CSW signing off.  Reed Breech LCSWA [phone]

## 2018-04-11 NOTE — Progress Notes (Addendum)
CSW spoke with SNF Facility.  The bipap machine has not been delivered to facility.  Facility has not been able to make contact with company for ETA.  CSW will continue to follow for disposition planning.  CSW updated family member on disposition of pt.  Reed Breech LCSWA [phone]

## 2018-04-12 DIAGNOSIS — J9620 Acute and chronic respiratory failure, unspecified whether with hypoxia or hypercapnia: Secondary | ICD-10-CM | POA: Diagnosis present

## 2018-04-12 DIAGNOSIS — R1312 Dysphagia, oropharyngeal phase: Secondary | ICD-10-CM | POA: Diagnosis present

## 2018-04-12 DIAGNOSIS — J810 Acute pulmonary edema: Secondary | ICD-10-CM | POA: Diagnosis not present

## 2018-04-12 DIAGNOSIS — R293 Abnormal posture: Secondary | ICD-10-CM | POA: Diagnosis present

## 2018-04-12 DIAGNOSIS — M2550 Pain in unspecified joint: Secondary | ICD-10-CM | POA: Diagnosis not present

## 2018-04-12 DIAGNOSIS — R4781 Slurred speech: Secondary | ICD-10-CM | POA: Diagnosis present

## 2018-04-12 DIAGNOSIS — I5032 Chronic diastolic (congestive) heart failure: Secondary | ICD-10-CM | POA: Diagnosis not present

## 2018-04-12 DIAGNOSIS — G8929 Other chronic pain: Secondary | ICD-10-CM | POA: Diagnosis present

## 2018-04-12 DIAGNOSIS — Z89511 Acquired absence of right leg below knee: Secondary | ICD-10-CM | POA: Diagnosis not present

## 2018-04-12 DIAGNOSIS — I10 Essential (primary) hypertension: Secondary | ICD-10-CM | POA: Diagnosis not present

## 2018-04-12 DIAGNOSIS — L853 Xerosis cutis: Secondary | ICD-10-CM | POA: Diagnosis not present

## 2018-04-12 DIAGNOSIS — G4733 Obstructive sleep apnea (adult) (pediatric): Secondary | ICD-10-CM | POA: Diagnosis not present

## 2018-04-12 DIAGNOSIS — K219 Gastro-esophageal reflux disease without esophagitis: Secondary | ICD-10-CM | POA: Diagnosis not present

## 2018-04-12 DIAGNOSIS — F423 Hoarding disorder: Secondary | ICD-10-CM | POA: Diagnosis not present

## 2018-04-12 DIAGNOSIS — F329 Major depressive disorder, single episode, unspecified: Secondary | ICD-10-CM | POA: Diagnosis present

## 2018-04-12 DIAGNOSIS — E875 Hyperkalemia: Secondary | ICD-10-CM | POA: Diagnosis not present

## 2018-04-12 DIAGNOSIS — Z79899 Other long term (current) drug therapy: Secondary | ICD-10-CM | POA: Diagnosis not present

## 2018-04-12 DIAGNOSIS — Z7401 Bed confinement status: Secondary | ICD-10-CM | POA: Diagnosis not present

## 2018-04-12 DIAGNOSIS — F331 Major depressive disorder, recurrent, moderate: Secondary | ICD-10-CM | POA: Diagnosis not present

## 2018-04-12 DIAGNOSIS — I872 Venous insufficiency (chronic) (peripheral): Secondary | ICD-10-CM | POA: Diagnosis not present

## 2018-04-12 DIAGNOSIS — Q845 Enlarged and hypertrophic nails: Secondary | ICD-10-CM | POA: Diagnosis not present

## 2018-04-12 DIAGNOSIS — B351 Tinea unguium: Secondary | ICD-10-CM | POA: Diagnosis not present

## 2018-04-12 DIAGNOSIS — M2450 Contracture, unspecified joint: Secondary | ICD-10-CM | POA: Diagnosis present

## 2018-04-12 DIAGNOSIS — N183 Chronic kidney disease, stage 3 (moderate): Secondary | ICD-10-CM | POA: Diagnosis not present

## 2018-04-12 DIAGNOSIS — M2010 Hallux valgus (acquired), unspecified foot: Secondary | ICD-10-CM | POA: Diagnosis not present

## 2018-04-12 DIAGNOSIS — M6281 Muscle weakness (generalized): Secondary | ICD-10-CM | POA: Diagnosis present

## 2018-04-12 DIAGNOSIS — J9601 Acute respiratory failure with hypoxia: Secondary | ICD-10-CM | POA: Diagnosis not present

## 2018-04-12 DIAGNOSIS — J9610 Chronic respiratory failure, unspecified whether with hypoxia or hypercapnia: Secondary | ICD-10-CM | POA: Diagnosis not present

## 2018-04-12 DIAGNOSIS — J9602 Acute respiratory failure with hypercapnia: Secondary | ICD-10-CM | POA: Diagnosis not present

## 2018-04-12 DIAGNOSIS — E119 Type 2 diabetes mellitus without complications: Secondary | ICD-10-CM | POA: Diagnosis not present

## 2018-04-12 DIAGNOSIS — L603 Nail dystrophy: Secondary | ICD-10-CM | POA: Diagnosis not present

## 2018-04-12 DIAGNOSIS — D649 Anemia, unspecified: Secondary | ICD-10-CM | POA: Diagnosis not present

## 2018-04-12 DIAGNOSIS — I739 Peripheral vascular disease, unspecified: Secondary | ICD-10-CM | POA: Diagnosis not present

## 2018-04-12 DIAGNOSIS — I87312 Chronic venous hypertension (idiopathic) with ulcer of left lower extremity: Secondary | ICD-10-CM | POA: Diagnosis present

## 2018-04-12 DIAGNOSIS — J309 Allergic rhinitis, unspecified: Secondary | ICD-10-CM | POA: Diagnosis present

## 2018-04-12 LAB — GLUCOSE, CAPILLARY: Glucose-Capillary: 149 mg/dL — ABNORMAL HIGH (ref 70–99)

## 2018-04-12 NOTE — Progress Notes (Signed)
Patient will Discharge To: [facility] Anticipated DC Date:04/12/18 Family Notified:yes, Rose Heart, sister, [phone] Transport HT:XHFS   Per MD patient ready for DC to [facility] . RN, patient, patient's family, and facility notified of DC. Assessment, Fl2/Pasrr, and Discharge Summary sent to facility. RN given number for report [phone], room 131). DC packet on chart. Ambulance transport requested for patient.   CSW signing off.  Reed Breech LCSWA [phone]

## 2018-04-12 NOTE — Progress Notes (Signed)
Patient Demographics:    Heather Zimmerman, is a 72 y.o. female, DOB - 1946-09-28, TXM:[id number]  Admit date - 03/31/2018   Admitting Physician Lahoma Rocker, MD  Outpatient Primary MD for the patient is Patient, No Pcp Per  LOS - 12   Chief Complaint  Patient presents with  . Altered Mental Status  . Respiratory Distress        Subjective:    Heather Zimmerman today has no fevers, no emesis,  No chest pain, no new complaints,, she was unable to be discharged to skilled nursing facility on 04/11/2018 due to inability of the facility to obtain CPAP/BiPAP machine.  I am notified that the CPAP/BiPAP machine has been obtained now as of 04/12/2018.... Patient is voiding well after removal of Foley catheter  Assessment  & Plan :    Active Problems:   Acute respiratory failure with hypoxia and hypercapnia (HCC)   Endotracheal tube present   Pulmonary edema   Brief Summary:- 72 y/o female OSA, CKD, HTN, presented with acute on chronic hypercapnic and hypoxic respiratory failure due to CHF exacerbation on 03/31/18 . initially unresponsive managed on BiPAP, prior history of severe OSA and multiple admissions, She was reportedly seen early am and in usual state of health. She apparently went to speech therapy and after completion, she was noted to be dyspneic, non-verbal and altered. She was put on a NRB for hypoxia. Sats were in the 60's. The patient was given narcan without significant response. She was transported to ED where she remained altered and hypoxic.  On admission found to be hypercarbic with a pH of 7.17, PCO2 greater than 100. Initial chest x-ray showed CHF with pulmonary interstitial and early alveolar edema. Also deemed to be septic and started on vancomycin and Zosyn. Patient subsequently intubated, extubated 04/06/18, transferred to [facility] 04/07/18. was on 6 L of oxygen, weaned down to 1 to 2 Liters of  Oxygen . Blood culture on 6/18 positive for gram-positive cocci/MSSA (X2), treated with Ancef.   she was unable to be discharged to skilled nursing facility on 04/11/2018 due to inability of the facility to obtain CPAP/BiPAP machine.  I am notified that the CPAP/BiPAP machine has been obtained now as of 04/12/2018  Discharge to skilled nursing facility when BiPAP machine is available, Use BiPAP nightly and as needed--- IPAP =14 and EPAP = 6   plan  1)Acute on chronic hypoxic and hypercapnic respiratory failure  in setting of acute pulmonary edema/acute on chronic diastolic heart failure AND OSA---  Clinically and radiologically much improved after IV diuresis, and antibiotics Successfully extubated on 04/06/09, Use BiPAP nightly and as needed--- IPAP =14 and EPAP = 6 Currently> 9  L negative  Currently oxygen requirement is back to her baseline which is about 2 L, Patient's PCO2 chronically elevated - Use BiPAP nightly and as needed--- IPAP =14 and EPAP = 6  . I think her baseline PCO2 is about 60 Limit narcotics and benzos, Continue bronchodilators   Discharge to skilled nursing facility when BiPAP machine is available, Use BiPAP nightly and as needed--- IPAP =14 and EPAP = 6  2)Acute on chronic diastolic heart failure, evidence of cor pulmonale with mildly reduced right ventricular function  Continue diuresis, Weight 278>255 pounds  Continuenorvasc   3)Coag Negative Staph Bacteremia -Etiology unclear; had 2 out of 2 cultures positive drawn from left and right antecubitals. She does have diabetic ulcer on her lower extremity this may be the culprit  DC Ancef,  completed 8 days antibiotics  4)AKI----acute kidney injury on CKD stage - III     creatinine on admission= 1.67 ,   baseline creatinine = 1.3   , creatinine is now= 1.6 (peak was 2.56)     ,  Avoid nephrotoxic agents/dehydration/hypotension   5)Morbid obesity -this complicates overall care   6) chronic microcytic  and hypochromic --- hemoglobin remains stable above 10, no evidence of ongoing bleeding   7)DM2 with hyperglycemia- Use Novolog/Humalog Sliding scale insulin with Accu-Cheks/Fingersticks as ordered   DVT prophylaxsis heparin  Code Status:Full code  Family Communication:Discussed   with the patient,    Disposition Plan: she was unable to be discharged to skilled nursing facility on 04/11/2018 due to inability of the facility to obtain CPAP/BiPAP machine.  I am notified that the CPAP/BiPAP machine has been obtained now as of 04/12/2018  Anticipate discharge to  SNF when  BiPAP is set up/ Discharge to skilled nursing facility when BiPAP machine is available, Use BiPAP nightly and as needed--- IPAP =14 and EPAP = 6   Consultants:  PCCM  Procedures: 03/31/2018 endotracheal tube>>6/24  CULTURES: Blood 6/18 > gram-positive cocci/MSSA (X2) Sputum 6/18 > not done Blood 6/21>>  CXR 6/21 > continued mild interstitial edema. 2D echo 04/01/2018: EF 60 to 74% grade 1 diastolic dysfunction right ventricle systolic function was mildly reduced.   Code Status : FULL   Disposition Plan  :  SNF when BiPAP is available   DVT Prophylaxis  :    Heparin    Lab Results  Component Value Date   PLT 268 04/09/2018    Inpatient Medications  Scheduled Meds: . amLODipine  5 mg Oral Daily  . arformoterol  15 mcg Nebulization BID  . aspirin  81 mg Oral Daily  . budesonide (PULMICORT) nebulizer solution  0.5 mg Nebulization BID  . DULoxetine  30 mg Oral BID  . famotidine  40 mg Oral QHS  . feeding supplement (GLUCERNA SHAKE)  237 mL Oral TID WC  . furosemide  80 mg Intravenous Q12H  . heparin  5,000 Units Subcutaneous Q8H  . insulin aspart  0-20 Units Subcutaneous BID AC  . potassium chloride  40 mEq Oral BID  . senna  1 tablet Oral QHS   Continuous Infusions: . dextrose Stopped (04/07/18 1338)   PRN Meds:.acetaminophen, bisacodyl,  ipratropium-albuterol    Anti-infectives (From admission, onward)   Start     Dose/Rate Route Frequency Ordered Stop   04/04/18 1800  ceFAZolin (ANCEF) IVPB 2g/100 mL premix  Status:  Discontinued     2 g 200 mL/hr over 30 Minutes Intravenous Every 8 hours 04/04/18 1231 04/07/18 0837   04/02/18 2200  ceFAZolin (ANCEF) IVPB 2g/100 mL premix  Status:  Discontinued     2 g 200 mL/hr over 30 Minutes Intravenous Every 12 hours 04/02/18 1116 04/04/18 1231   04/01/18 1700  vancomycin (VANCOCIN) 1,500 mg in sodium chloride 0.9 % 500 mL IVPB  Status:  Discontinued     1,500 mg 250 mL/hr over 120 Minutes Intravenous Every 24 hours 03/31/18 1611 04/01/18 1541   04/01/18 1630  ceFAZolin (ANCEF) IVPB 2g/100 mL premix  Status:  Discontinued     2 g 200 mL/hr over 30 Minutes Intravenous Every 8 hours  04/01/18 1541 04/02/18 1116   03/31/18 2300  piperacillin-tazobactam (ZOSYN) IVPB 3.375 g  Status:  Discontinued     3.375 g 12.5 mL/hr over 240 Minutes Intravenous Every 8 hours 03/31/18 1611 04/01/18 1541   03/31/18 1700  vancomycin (VANCOCIN) 2,500 mg in sodium chloride 0.9 % 500 mL IVPB  Status:  Discontinued     2,500 mg 250 mL/hr over 120 Minutes Intravenous  Once 03/31/18 1611 04/01/18 1541   03/31/18 1630  piperacillin-tazobactam (ZOSYN) IVPB 3.375 g     3.375 g 100 mL/hr over 30 Minutes Intravenous  Once 03/31/18 1611 03/31/18 1712        Objective:   Vitals:   04/12/18 0348 04/12/18 0445 04/12/18 0821 04/12/18 0839  BP:  111/62 132/66   Pulse:  80    Resp:  (!) 21    Temp:  98.4 F (36.9 C)    TempSrc:      SpO2:  93%  96%  Weight: 113.6 kg (250 lb 7.1 oz)     Height:        Wt Readings from Last 3 Encounters:  04/12/18 113.6 kg (250 lb 7.1 oz)  10/29/17 131.5 kg (290 lb)  05/31/17 131.8 kg (290 lb 9.1 oz)     Intake/Output Summary (Last 24 hours) at 04/12/2018 1018 Last data filed at 04/12/2018 0900 Gross per 24 hour  Intake 740 ml  Output 500 ml  Net 240 ml      Physical Exam Gen:- Awake Alert,  Obese, in no apparent distress  HEENT:- [place].AT, No sclera icterus Nose- [place] 2 L/min Neck-Supple Neck,No JVD,.  Lungs-  CTAB , fair air movement CV- S1, S2 normal Abd-  +ve B.Sounds, Abd Soft, No tenderness,    Extremity/Skin:-Rt BKA Psych-affect is appropriate, oriented x3 Neuro-no new focal deficits, no tremors GU-, Foley removed  Data Review:   Micro Results Recent Results (from the past 240 hour(s))  Culture, blood (routine x 2)     Status: None   Collection Time: 04/03/18  2:56 PM  Result Value Ref Range Status   Specimen Description BLOOD RIGHT ANTECUBITAL  Final   Special Requests   Final    BOTTLES DRAWN AEROBIC AND ANAEROBIC Blood Culture adequate volume   Culture   Final    NO GROWTH 5 DAYS Performed at [facility], [place] [address], [place], [place] [address]    Report Status 04/08/2018 FINAL  Final  Culture, blood (routine x 2)     Status: None   Collection Time: 04/03/18  2:59 PM  Result Value Ref Range Status   Specimen Description BLOOD RIGHT HAND  Final   Special Requests   Final    BOTTLES DRAWN AEROBIC ONLY Blood Culture adequate volume   Culture   Final    NO GROWTH 5 DAYS Performed at [facility], [place] [address] [place], [place] [address]    Report Status 04/08/2018 FINAL  Final    Radiology Reports Ct Head Wo Contrast  Result Date: 04/01/2018 CLINICAL DATA:  72 y/o F; patient admitted with altered mental status, speech alteration, and respiratory distress. EXAM: CT HEAD WITHOUT CONTRAST TECHNIQUE: Contiguous axial images were obtained from the base of the skull through the vertex without intravenous contrast. COMPARISON:  06/22/2017 CT head. FINDINGS: Brain: No evidence of acute infarction, hemorrhage, hydrocephalus, extra-axial collection or mass lesion/mass effect. Stable mild chronic microvascular ischemic changes and parenchymal volume loss of the brain. Vascular: Calcific atherosclerosis of the  carotid siphons.  Skull: Normal. Negative for fracture or focal lesion. Sinuses/Orbits: Chronic right floor of orbit fracture and opacification of the right maxillary sinus. Nasoenteric tube. Other: None. IMPRESSION: 1. No acute intracranial abnormality identified. 2. Stable mild chronic microvascular ischemic changes and parenchymal volume loss of the brain for age. 3. Chronic right floor of orbit fracture and opacification of the right maxillary sinus. Electronically Signed   By: Kristine Garbe M.D.   On: 04/01/2018 01:35   Dg Chest Port 1 View  Result Date: 04/07/2018 CLINICAL DATA:  Shortness of breath EXAM: PORTABLE CHEST 1 VIEW COMPARISON:  04/06/2018 FINDINGS: Cardiac shadow is enlarged but stable. Endotracheal tube and nasogastric catheter have been removed in the interval. Left jugular central line is again seen and stable. The lungs are well aerated bilaterally. Significant improved aeration in the bases is noted. No bony abnormality is seen. IMPRESSION: Improved aeration from the prior exam. Electronically Signed   By: Inez Catalina M.D.   On: 04/07/2018 07:50   Dg Chest Port 1 View  Result Date: 04/06/2018 CLINICAL DATA:  Acute respiratory failure.  Hypoxemia. EXAM: PORTABLE CHEST 1 VIEW COMPARISON:  April 05, 2018 FINDINGS: The ETT is in good position. The NG tube terminates below today's film. A left central line is in stable position. No pneumothorax. Opacity in left base is more pronounced in the interval. Mild opacity in the right base is also more pronounced. IMPRESSION: 1. Support apparatus as above. 2. Bibasilar opacities, worsened in the interval. Electronically Signed   By: Dorise Bullion III M.D   On: 04/06/2018 07:21   Dg Chest Port 1 View  Result Date: 04/05/2018 CLINICAL DATA:  Followup ventilated patient. EXAM: PORTABLE CHEST 1 VIEW COMPARISON:  04/04/2018 and multiple prior studies. FINDINGS: Lung volumes remain low. There is vascular congestion and by basilar lung  opacity, the latter finding consistent with atelectasis and small effusions. No convincing pulmonary edema. Cardiac silhouette is mildly enlarged. Endotracheal tube, left internal jugular central venous line and nasogastric tube are stable. IMPRESSION: 1. No significant change from the most recent prior exam. 2. Persistent basilar opacities consistent with a combination of small effusions and atelectasis. This is greater on the left. There is vascular congestion without convincing pulmonary edema. 3. Support apparatus is stable. Electronically Signed   By: Lajean Manes M.D.   On: 04/05/2018 09:30   Dg Chest Port 1 View  Result Date: 04/04/2018 CLINICAL DATA:  Respiratory failure. EXAM: PORTABLE CHEST 1 VIEW COMPARISON:  04/03/2018 FINDINGS: Endotracheal tube has tip 2.1 cm above the carina. Left IJ central venous catheter unchanged with tip over the SVC. Nasogastric tube is present with tip over the distal stomach in the midline and side-port over the stomach in the left upper quadrant. Lungs are adequately inflated with interval improvement in previously noted left mid lung and bibasilar opacification likely improving interstitial edema. Persistent left base/retrocardiac opacification with obscuration of the left hemidiaphragm likely due to effusion and atelectasis although infection is possible. Stable cardiomegaly. Remainder of the exam is unchanged. IMPRESSION: Moderate interval improvement in left mid lung and bibasilar opacification likely improving interstitial edema. Persistent left base/retrocardiac opacification likely effusion with atelectasis although infection is possible. Tubes and lines as described. Electronically Signed   By: Marin Olp M.D.   On: 04/04/2018 09:04   Dg Chest Port 1 View  Result Date: 04/03/2018 CLINICAL DATA:  Respiratory failure. EXAM: PORTABLE CHEST 1 VIEW COMPARISON:  04/02/2018. FINDINGS: Endotracheal tube, NG tube left central line in stable position. Cardiomegaly  again noted. Bibasilar and left mid lung field pulmonary infiltrates/edema again noted. Small pleural effusions again noted. No pneumothorax. IMPRESSION: 1.  Lines and tubes in stable position. 2. Cardiomegaly with bibasilar and left mid lung field pulmonary infiltrates/edema again noted. Small pleural effusions again noted. Similar findings on prior exam. No pneumothorax. Electronically Signed   By: Marcello Moores  Register   On: 04/03/2018 05:58   Dg Chest Portable 1 View  Result Date: 04/02/2018 CLINICAL DATA:  Hypoxia EXAM: PORTABLE CHEST 1 VIEW COMPARISON:  April 01, 2018 FINDINGS: Endotracheal tube tip is 2.6 cm above the carina. Nasogastric tube tip and side port below the diaphragm. Central catheter tip is in the left innominate vein near the junction with the superior vena cava. No evident pneumothorax. There are pleural effusions bilaterally with patchy airspace consolidation in both lower lobe regions. No new opacity evident. There is stable cardiomegaly. The pulmonary vascularity appears within normal limits. No evident adenopathy. There is aortic atherosclerosis. No evident bone lesions. IMPRESSION: Tube and catheter positions as described. No pneumothorax. There remains cardiomegaly with pleural effusions. Airspace consolidation in the bases is concerning for pneumonia. There may be alveolar edema as well. There is aortic atherosclerosis. Aortic Atherosclerosis (ICD10-I70.0). Electronically Signed   By: Lowella Grip III M.D.   On: 04/02/2018 08:25   Dg Chest Port 1 View  Result Date: 04/01/2018 CLINICAL DATA:  Hypoxia EXAM: PORTABLE CHEST 1 VIEW COMPARISON:  March 31, 2018 FINDINGS: Endotracheal tube tip is 1.0 cm above the carina. Central catheter tip is in the left innominate vein near the junction with the superior vena cava. Nasogastric tube tip and side port are below the diaphragm with side port seen in the stomach. No pneumothorax. There is cardiomegaly with pulmonary vascularity normal.  There is patchy airspace consolidation in both lower lobes with small pleural effusions bilaterally. No evident adenopathy. There is aortic atherosclerosis. There is degenerative change in each shoulder. IMPRESSION: Tube and catheter positions as described without pneumothorax. Note that the endotracheal tube tip is near the carina. It may be prudent to consider withdrawing endotracheal tube approximately 3 cm. Cardiomegaly with lower lobe consolidation and small pleural effusions. Suspect bibasilar pneumonia, although alveolar edema may be present in the lung bases as well. There is aortic atherosclerosis. Aortic Atherosclerosis (ICD10-I70.0). Electronically Signed   By: Lowella Grip III M.D.   On: 04/01/2018 07:30   Portable Chest X-ray  Result Date: 03/31/2018 CLINICAL DATA:  71 year old female post endotracheal tube placement. Subsequent encounter. EXAM: PORTABLE CHEST 1 VIEW COMPARISON:  03/31/2018 1:02 p.m. FINDINGS: Endotracheal tube tip 1.8 cm above the carina. Left central line tip proximal superior vena cava level. Nasogastric tube courses below the diaphragm. Tip is not included on the present exam. No pneumothorax. Cardiomegaly. Persistent slightly asymmetric airspace disease suggestive of pulmonary edema. Calcified aorta. IMPRESSION: Endotracheal tube tip 1.8 cm above the carina. Left central line tip proximal superior vena cava level. No pneumothorax. Cardiomegaly. Persistent slightly asymmetric airspace disease suggestive of pulmonary edema. Aortic Atherosclerosis (ICD10-I70.0). Electronically Signed   By: Genia Del M.D.   On: 03/31/2018 19:46   Dg Chest Portable 1 View  Result Date: 03/31/2018 CLINICAL DATA:  Respiratory distress. History of chronic CHF, diabetes, chronic renal insufficiency, morbid obesity. EXAM: PORTABLE CHEST 1 VIEW COMPARISON:  Portable chest x-ray of May 28, 2017 FINDINGS: The lungs are well-expanded. The pulmonary vascularity is engorged and the interstitial  markings are increased. There is a small left pleural effusion. The cardiac silhouette is enlarged. The  trachea is deviated slightly to the right chronically. There is calcification in the wall of the aortic arch. IMPRESSION: CHF with pulmonary interstitial and early alveolar edema and small left pleural effusion. Thoracic aortic atherosclerosis. Electronically Signed   By: David  Martinique M.D.   On: 03/31/2018 13:14   Dg Abd Portable 1v  Result Date: 03/31/2018 CLINICAL DATA:  72 year old female with nasogastric tube placement. Initial encounter. EXAM: PORTABLE ABDOMEN - 1 VIEW COMPARISON:  Chest x-ray same date.  CT abdomen pelvis 05/29/2017. FINDINGS: Nasogastric tube tip gastric antrum level with side hole fundus-body junction level. Limited evaluation of bowel gas pattern without gross abnormality. The possibility of free intraperitoneal air cannot be assessed on a supine view. IUD in place. IMPRESSION: Nasogastric tube tip gastric antrum level with side hole fundus-body junction level. Electronically Signed   By: Genia Del M.D.   On: 03/31/2018 19:48     CBC Recent Labs  Lab 04/07/18 0351 04/08/18 0620 04/09/18 0458  WBC 6.1 5.9 5.8  HGB 10.7* 10.8* 11.1*  HCT 38.4 38.2 39.0  PLT 222 235 268  MCV 88.1 88.0 88.0  MCH 24.5* 24.9* 25.1*  MCHC 27.9* 28.3* 28.5*  RDW 19.5* 19.2* 18.9*    Chemistries  Recent Labs  Lab 04/06/18 0353 04/07/18 0351 04/08/18 0620 04/09/18 0458 04/10/18 0622  NA 149* 145 143 141 139  K 3.4* 3.8 4.4 4.5 4.6  CL 96* 96* 96* 95* 93*  CO2 42* 40* 38* 38* 39*  GLUCOSE 187* 152* 183* 130* 131*  BUN 39* 32* 30* 31* 31*  CREATININE 1.46* 1.40* 1.40* 1.50* 1.66*  CALCIUM 9.2 9.2 9.2 9.0 9.1  AST  --  17  --  18 22  ALT  --  <5  --  5 8  ALKPHOS  --  40  --  47 55  BILITOT  --  0.7  --  0.7 0.7   ------------------------------------------------------------------------------------------------------------------ No results for input(s): CHOL, HDL,  LDLCALC, TRIG, CHOLHDL, LDLDIRECT in the last 72 hours.  Lab Results  Component Value Date   HGBA1C 7.0 11/27/2016   ------------------------------------------------------------------------------------------------------------------ No results for input(s): TSH, T4TOTAL, T3FREE, THYROIDAB in the last 72 hours.  Invalid input(s): FREET3 ------------------------------------------------------------------------------------------------------------------ No results for input(s): VITAMINB12, FOLATE, FERRITIN, TIBC, IRON, RETICCTPCT in the last 72 hours.  Coagulation profile No results for input(s): INR, PROTIME in the last 168 hours.  No results for input(s): DDIMER in the last 72 hours.  Cardiac Enzymes No results for input(s): CKMB, TROPONINI, MYOGLOBIN in the last 168 hours.  Invalid input(s): CK ------------------------------------------------------------------------------------------------------------------    Component Value Date/Time   BNP 18.3 04/02/2018 0629     Roxan Hockey M.D on 04/12/2018 at 10:18 AM  Between 7am to 7pm - Pager - [phone]  After 7pm go to [email or web address] - password [email or web address]  [facility] -  Office  [phone]   Voice Recognition Viviann Spare dictation system was used to create this note, attempts have been made to correct errors. Please contact the author with questions and/or clarifications.

## 2018-04-12 NOTE — Progress Notes (Signed)
Called Respiratory Therapist to place pt on CPAP, and therapist reported patient didn't tolerate/wear it the night before so she was leaving it off tonight. RN will monitor patient sats closely.

## 2018-04-12 NOTE — Progress Notes (Signed)
Heather Zimmerman. Heather Zimmerman to be D/C'd to [facility] per MD order. Report called to [facility] at [place]. VVS, Skin clean and dry, no evidence of skin tears noted.  IV catheter discontinued intact. Site without signs and symptoms of complications. Dressing and pressure applied.  An After Visit Summary was printed and given to [facility].  Patient escorted via stretcher, and D/C to [facility] via [facility].  Melonie [place]  04/12/2018 11:59 AM

## 2018-04-13 DIAGNOSIS — B351 Tinea unguium: Secondary | ICD-10-CM | POA: Diagnosis not present

## 2018-04-13 DIAGNOSIS — I5032 Chronic diastolic (congestive) heart failure: Secondary | ICD-10-CM | POA: Diagnosis not present

## 2018-04-13 DIAGNOSIS — Q845 Enlarged and hypertrophic nails: Secondary | ICD-10-CM | POA: Diagnosis not present

## 2018-04-13 DIAGNOSIS — I739 Peripheral vascular disease, unspecified: Secondary | ICD-10-CM | POA: Diagnosis not present

## 2018-04-13 DIAGNOSIS — J9610 Chronic respiratory failure, unspecified whether with hypoxia or hypercapnia: Secondary | ICD-10-CM | POA: Diagnosis not present

## 2018-04-13 DIAGNOSIS — M2010 Hallux valgus (acquired), unspecified foot: Secondary | ICD-10-CM | POA: Diagnosis not present

## 2018-04-13 DIAGNOSIS — G4733 Obstructive sleep apnea (adult) (pediatric): Secondary | ICD-10-CM | POA: Diagnosis not present

## 2018-04-13 DIAGNOSIS — L603 Nail dystrophy: Secondary | ICD-10-CM | POA: Diagnosis not present

## 2018-04-13 DIAGNOSIS — N183 Chronic kidney disease, stage 3 (moderate): Secondary | ICD-10-CM | POA: Diagnosis not present

## 2018-04-13 DIAGNOSIS — L853 Xerosis cutis: Secondary | ICD-10-CM | POA: Diagnosis not present

## 2018-04-14 DIAGNOSIS — M6281 Muscle weakness (generalized): Secondary | ICD-10-CM | POA: Diagnosis not present

## 2018-04-14 DIAGNOSIS — E119 Type 2 diabetes mellitus without complications: Secondary | ICD-10-CM | POA: Diagnosis not present

## 2018-04-14 DIAGNOSIS — Z89511 Acquired absence of right leg below knee: Secondary | ICD-10-CM | POA: Diagnosis not present

## 2018-04-14 DIAGNOSIS — I872 Venous insufficiency (chronic) (peripheral): Secondary | ICD-10-CM | POA: Diagnosis not present

## 2018-04-15 DIAGNOSIS — E119 Type 2 diabetes mellitus without complications: Secondary | ICD-10-CM | POA: Diagnosis not present

## 2018-04-15 DIAGNOSIS — G4733 Obstructive sleep apnea (adult) (pediatric): Secondary | ICD-10-CM | POA: Diagnosis not present

## 2018-04-15 DIAGNOSIS — J9610 Chronic respiratory failure, unspecified whether with hypoxia or hypercapnia: Secondary | ICD-10-CM | POA: Diagnosis not present

## 2018-04-15 DIAGNOSIS — I5032 Chronic diastolic (congestive) heart failure: Secondary | ICD-10-CM | POA: Diagnosis not present

## 2018-04-20 DIAGNOSIS — I5032 Chronic diastolic (congestive) heart failure: Secondary | ICD-10-CM | POA: Diagnosis not present

## 2018-04-20 DIAGNOSIS — N183 Chronic kidney disease, stage 3 (moderate): Secondary | ICD-10-CM | POA: Diagnosis not present

## 2018-04-20 DIAGNOSIS — E875 Hyperkalemia: Secondary | ICD-10-CM | POA: Diagnosis not present

## 2018-04-21 DIAGNOSIS — E119 Type 2 diabetes mellitus without complications: Secondary | ICD-10-CM | POA: Diagnosis not present

## 2018-04-21 DIAGNOSIS — I10 Essential (primary) hypertension: Secondary | ICD-10-CM | POA: Diagnosis not present

## 2018-04-21 DIAGNOSIS — I872 Venous insufficiency (chronic) (peripheral): Secondary | ICD-10-CM | POA: Diagnosis not present

## 2018-04-21 DIAGNOSIS — M6281 Muscle weakness (generalized): Secondary | ICD-10-CM | POA: Diagnosis not present

## 2018-04-26 IMAGING — CT CT HEAD W/O CM
2 of 4 series · 14 of 47 positions shown, 17 images · non-contrast
Comparison: October 03, 2015

CLINICAL DATA: Headache and altered mental status

EXAM:
CT HEAD WITHOUT CONTRAST
TECHNIQUE: Contiguous axial images were obtained from the base of the skull
through the vertex without intravenous contrast.

[Series 2: head w/o · axial · non-contrast · 0.45mm/px · z∈[-250,-115]mm · 11 of 33 slices shown, 14 images]
[im 3/33  brain]
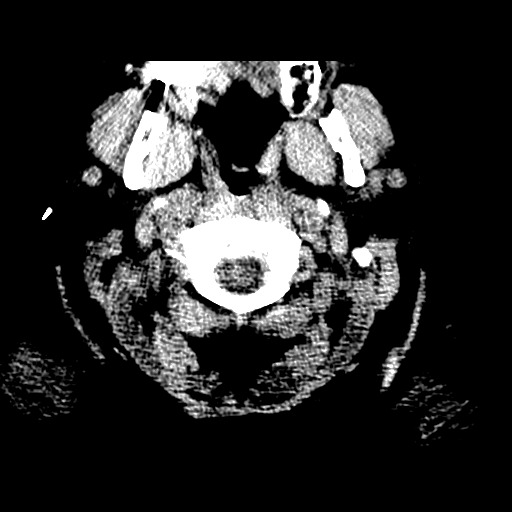
[im 3/33  bone]
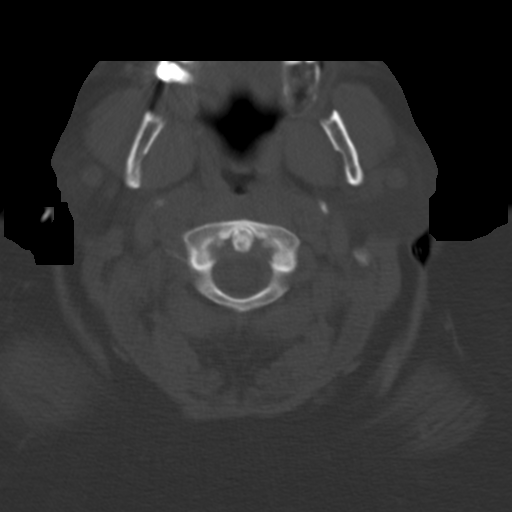
[im 5/33  brain]
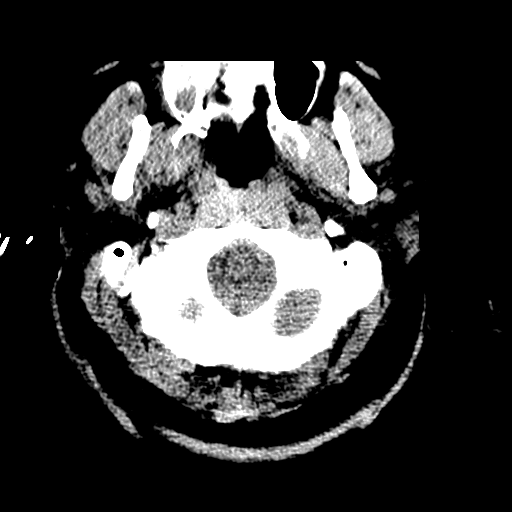
[im 7/33  brain]
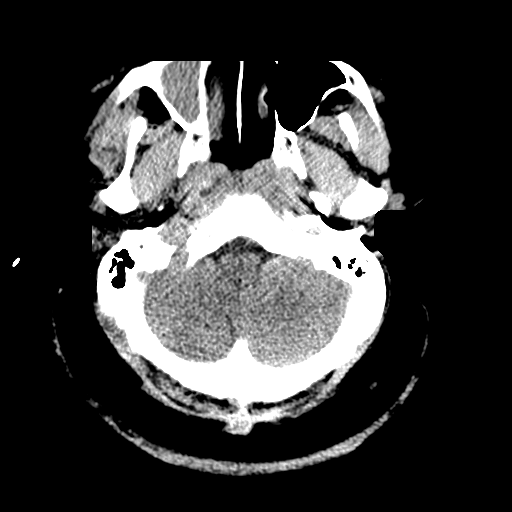
[im 12/33  brain]
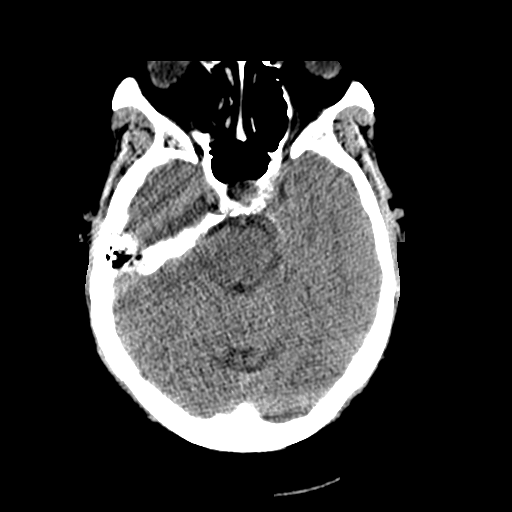
[im 14/33  brain]
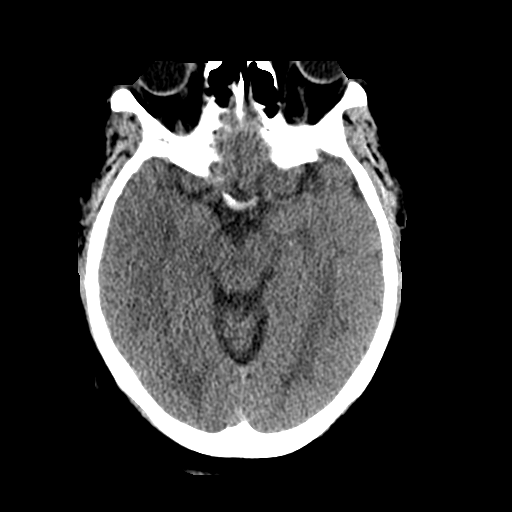
[im 14/33  bone]
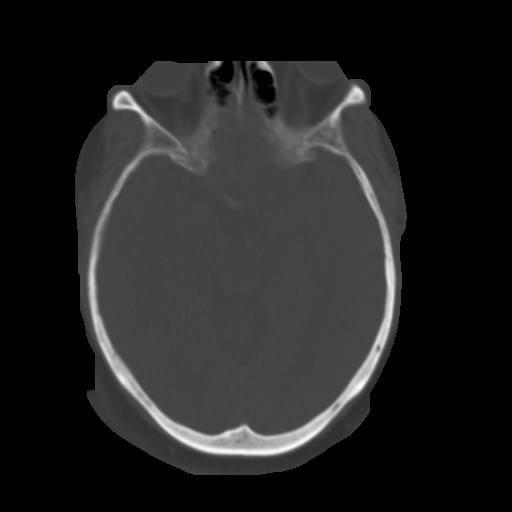
[im 17/33  brain]
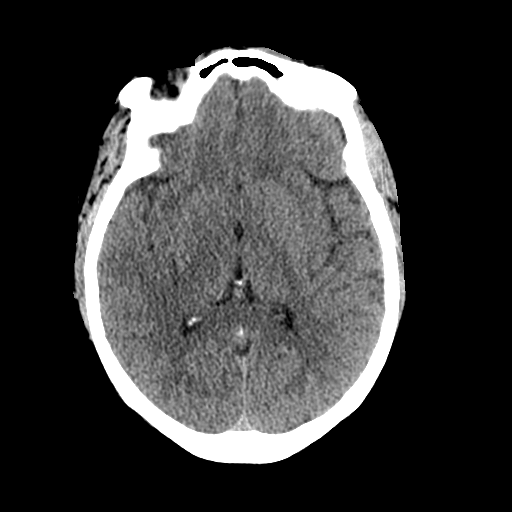
[im 19/33  brain]
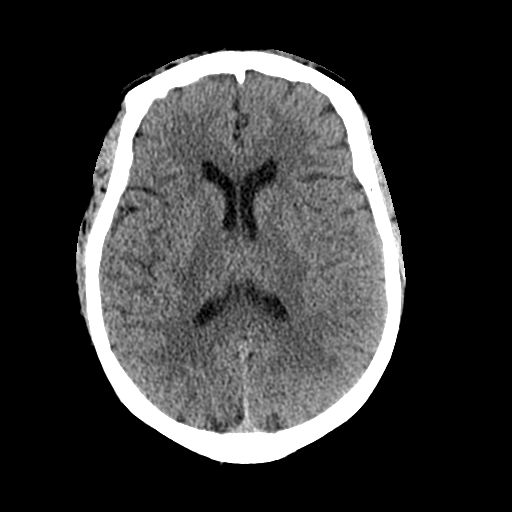
[im 21/33  brain]
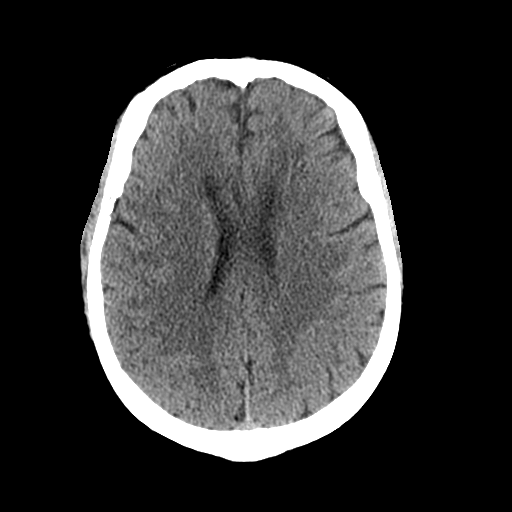
[im 26/33  brain]
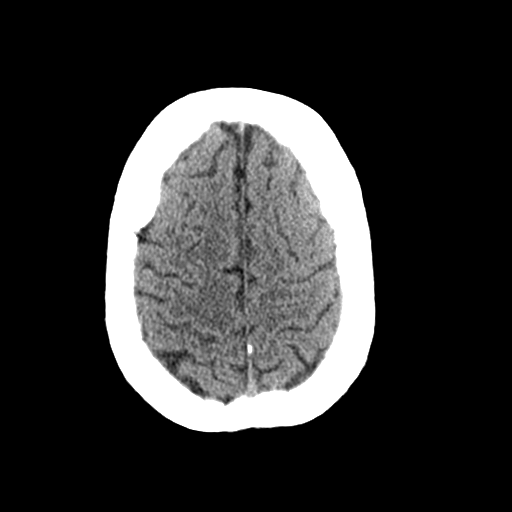
[im 26/33  bone]
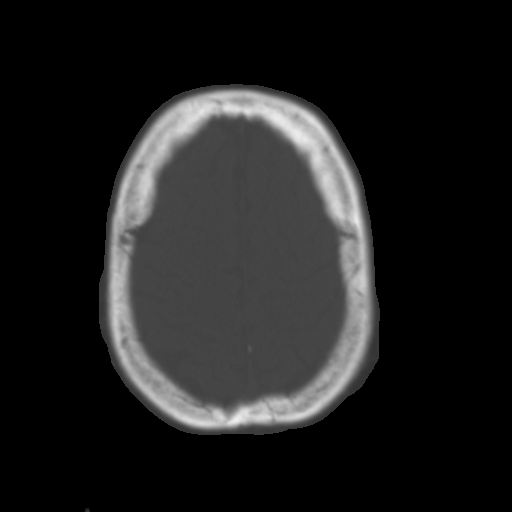
[im 28/33  brain]
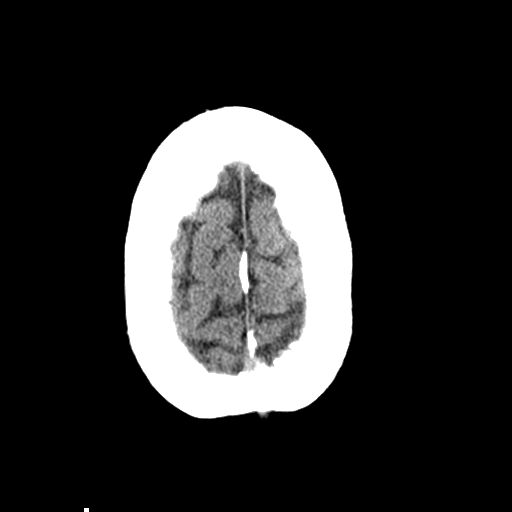
[im 30/33  brain]
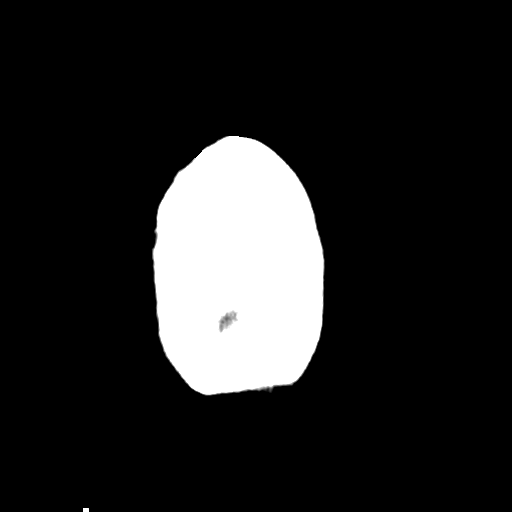

[Series 6: coronal · coronal · 0.32mm/px · 3 of 76 slices shown]
[im 26/76  brain]
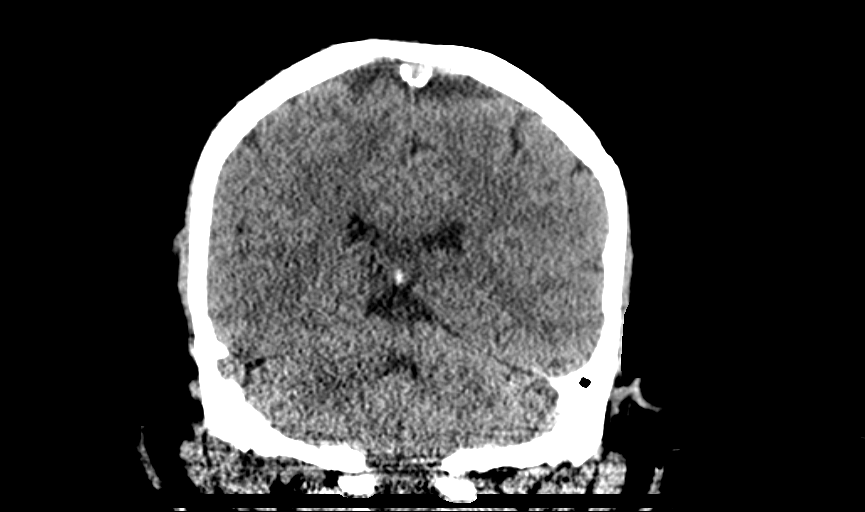
[im 34/76  brain]
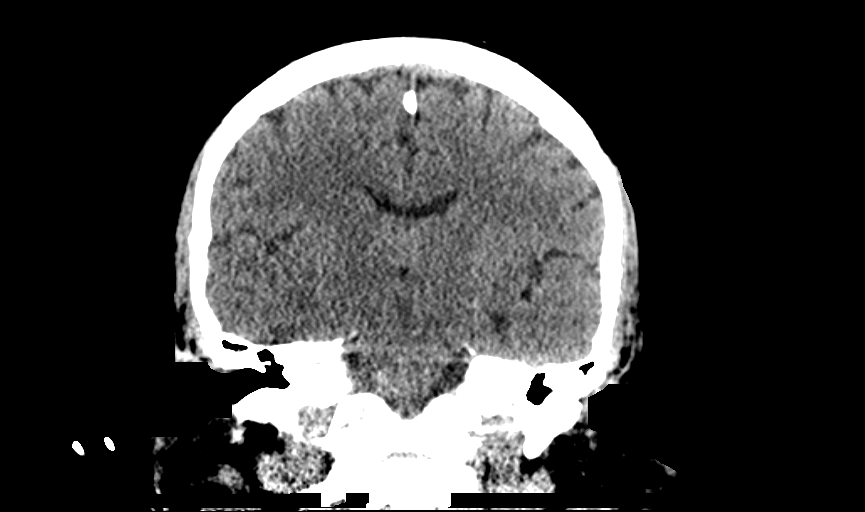
[im 42/76  brain]
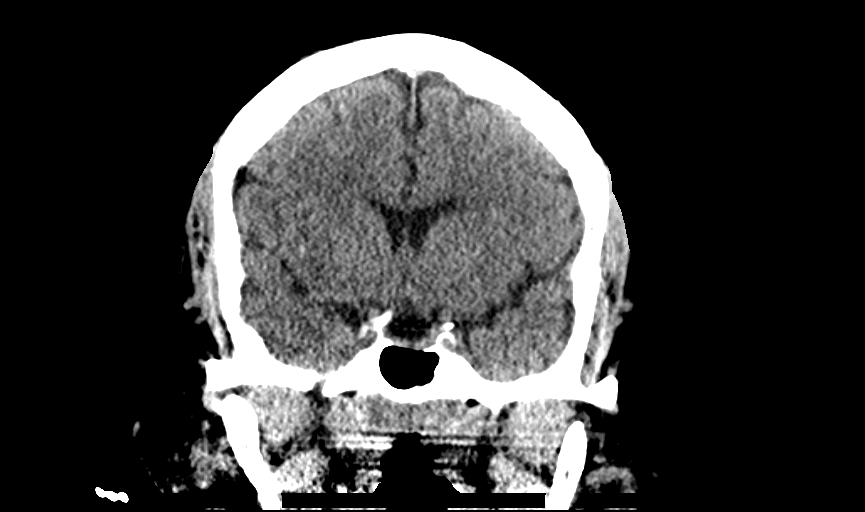

[14 of 47 positions shown; findings below may reference images not displayed]

FINDINGS: Brain: The ventricles are normal in size and configuration. There is
no intracranial mass, hemorrhage, extra-axial fluid collection, or
midline shift. Gray-white compartments appear normal. No acute
infarct evident.

Vascular: No hyperdense vessel evident. No vascular calcification
evident.

Skull: The bony calvarium appears intact.

Sinuses/Orbits: There is diffuse opacification of the right
maxillary antrum. There is a small retention cyst in the medial left
maxillary antrum. Other paranasal sinuses are clear. There is medial
bowing of the medial orbital wall on the right, likely of
posttraumatic etiology. Orbits otherwise appear unremarkable.

Other: Mastoid air cells are clear.
IMPRESSION: No intracranial mass, hemorrhage, or extra-axial fluid collection.
Gray-white compartments are normal. Diffuse opacification noted of
the right maxillary antrum. Bowing of the right medial orbital wall
may be secondary to prior trauma.

## 2018-04-28 DIAGNOSIS — E119 Type 2 diabetes mellitus without complications: Secondary | ICD-10-CM | POA: Diagnosis not present

## 2018-04-28 DIAGNOSIS — I872 Venous insufficiency (chronic) (peripheral): Secondary | ICD-10-CM | POA: Diagnosis not present

## 2018-04-28 DIAGNOSIS — M6281 Muscle weakness (generalized): Secondary | ICD-10-CM | POA: Diagnosis not present

## 2018-04-28 DIAGNOSIS — I10 Essential (primary) hypertension: Secondary | ICD-10-CM | POA: Diagnosis not present

## 2018-05-05 DIAGNOSIS — E119 Type 2 diabetes mellitus without complications: Secondary | ICD-10-CM | POA: Diagnosis not present

## 2018-05-05 DIAGNOSIS — I872 Venous insufficiency (chronic) (peripheral): Secondary | ICD-10-CM | POA: Diagnosis not present

## 2018-05-05 DIAGNOSIS — I10 Essential (primary) hypertension: Secondary | ICD-10-CM | POA: Diagnosis not present

## 2018-05-05 DIAGNOSIS — M6281 Muscle weakness (generalized): Secondary | ICD-10-CM | POA: Diagnosis not present

## 2018-05-08 DIAGNOSIS — F423 Hoarding disorder: Secondary | ICD-10-CM | POA: Diagnosis not present

## 2018-05-08 DIAGNOSIS — F331 Major depressive disorder, recurrent, moderate: Secondary | ICD-10-CM | POA: Diagnosis not present

## 2018-05-11 DIAGNOSIS — G4733 Obstructive sleep apnea (adult) (pediatric): Secondary | ICD-10-CM | POA: Diagnosis not present

## 2018-05-11 DIAGNOSIS — I5032 Chronic diastolic (congestive) heart failure: Secondary | ICD-10-CM | POA: Diagnosis not present

## 2018-05-11 DIAGNOSIS — N183 Chronic kidney disease, stage 3 (moderate): Secondary | ICD-10-CM | POA: Diagnosis not present

## 2018-05-11 DIAGNOSIS — I10 Essential (primary) hypertension: Secondary | ICD-10-CM | POA: Diagnosis not present

## 2018-05-12 DIAGNOSIS — I872 Venous insufficiency (chronic) (peripheral): Secondary | ICD-10-CM | POA: Diagnosis not present

## 2018-05-12 DIAGNOSIS — E119 Type 2 diabetes mellitus without complications: Secondary | ICD-10-CM | POA: Diagnosis not present

## 2018-05-12 DIAGNOSIS — I10 Essential (primary) hypertension: Secondary | ICD-10-CM | POA: Diagnosis not present

## 2018-05-12 DIAGNOSIS — M6281 Muscle weakness (generalized): Secondary | ICD-10-CM | POA: Diagnosis not present

## 2018-05-14 DIAGNOSIS — J9610 Chronic respiratory failure, unspecified whether with hypoxia or hypercapnia: Secondary | ICD-10-CM | POA: Diagnosis not present

## 2018-05-14 DIAGNOSIS — K219 Gastro-esophageal reflux disease without esophagitis: Secondary | ICD-10-CM | POA: Diagnosis not present

## 2018-05-14 DIAGNOSIS — E119 Type 2 diabetes mellitus without complications: Secondary | ICD-10-CM | POA: Diagnosis not present

## 2018-05-18 DIAGNOSIS — J9602 Acute respiratory failure with hypercapnia: Secondary | ICD-10-CM | POA: Diagnosis not present

## 2018-05-18 DIAGNOSIS — I5032 Chronic diastolic (congestive) heart failure: Secondary | ICD-10-CM | POA: Diagnosis not present

## 2018-05-18 DIAGNOSIS — R4781 Slurred speech: Secondary | ICD-10-CM | POA: Diagnosis not present

## 2018-05-19 DIAGNOSIS — R4781 Slurred speech: Secondary | ICD-10-CM | POA: Diagnosis not present

## 2018-05-19 DIAGNOSIS — J9602 Acute respiratory failure with hypercapnia: Secondary | ICD-10-CM | POA: Diagnosis not present

## 2018-05-19 DIAGNOSIS — I10 Essential (primary) hypertension: Secondary | ICD-10-CM | POA: Diagnosis not present

## 2018-05-19 DIAGNOSIS — I5032 Chronic diastolic (congestive) heart failure: Secondary | ICD-10-CM | POA: Diagnosis not present

## 2018-05-19 DIAGNOSIS — E119 Type 2 diabetes mellitus without complications: Secondary | ICD-10-CM | POA: Diagnosis not present

## 2018-05-19 DIAGNOSIS — G8929 Other chronic pain: Secondary | ICD-10-CM | POA: Diagnosis not present

## 2018-05-20 DIAGNOSIS — J9602 Acute respiratory failure with hypercapnia: Secondary | ICD-10-CM | POA: Diagnosis not present

## 2018-05-20 DIAGNOSIS — I5032 Chronic diastolic (congestive) heart failure: Secondary | ICD-10-CM | POA: Diagnosis not present

## 2018-05-20 DIAGNOSIS — R4781 Slurred speech: Secondary | ICD-10-CM | POA: Diagnosis not present

## 2018-05-21 DIAGNOSIS — R4781 Slurred speech: Secondary | ICD-10-CM | POA: Diagnosis not present

## 2018-05-21 DIAGNOSIS — I5032 Chronic diastolic (congestive) heart failure: Secondary | ICD-10-CM | POA: Diagnosis not present

## 2018-05-21 DIAGNOSIS — J9602 Acute respiratory failure with hypercapnia: Secondary | ICD-10-CM | POA: Diagnosis not present

## 2018-05-22 DIAGNOSIS — J9602 Acute respiratory failure with hypercapnia: Secondary | ICD-10-CM | POA: Diagnosis not present

## 2018-05-22 DIAGNOSIS — I5032 Chronic diastolic (congestive) heart failure: Secondary | ICD-10-CM | POA: Diagnosis not present

## 2018-05-22 DIAGNOSIS — R4781 Slurred speech: Secondary | ICD-10-CM | POA: Diagnosis not present

## 2018-05-25 DIAGNOSIS — J9602 Acute respiratory failure with hypercapnia: Secondary | ICD-10-CM | POA: Diagnosis not present

## 2018-05-25 DIAGNOSIS — R4781 Slurred speech: Secondary | ICD-10-CM | POA: Diagnosis not present

## 2018-05-25 DIAGNOSIS — I5032 Chronic diastolic (congestive) heart failure: Secondary | ICD-10-CM | POA: Diagnosis not present

## 2018-05-26 DIAGNOSIS — E119 Type 2 diabetes mellitus without complications: Secondary | ICD-10-CM | POA: Diagnosis not present

## 2018-05-26 DIAGNOSIS — I5032 Chronic diastolic (congestive) heart failure: Secondary | ICD-10-CM | POA: Diagnosis not present

## 2018-05-26 DIAGNOSIS — G8929 Other chronic pain: Secondary | ICD-10-CM | POA: Diagnosis not present

## 2018-05-26 DIAGNOSIS — R4781 Slurred speech: Secondary | ICD-10-CM | POA: Diagnosis not present

## 2018-05-26 DIAGNOSIS — J9602 Acute respiratory failure with hypercapnia: Secondary | ICD-10-CM | POA: Diagnosis not present

## 2018-05-26 DIAGNOSIS — I10 Essential (primary) hypertension: Secondary | ICD-10-CM | POA: Diagnosis not present

## 2018-05-27 DIAGNOSIS — J9602 Acute respiratory failure with hypercapnia: Secondary | ICD-10-CM | POA: Diagnosis not present

## 2018-05-27 DIAGNOSIS — I5032 Chronic diastolic (congestive) heart failure: Secondary | ICD-10-CM | POA: Diagnosis not present

## 2018-05-27 DIAGNOSIS — R4781 Slurred speech: Secondary | ICD-10-CM | POA: Diagnosis not present

## 2018-05-28 DIAGNOSIS — J9602 Acute respiratory failure with hypercapnia: Secondary | ICD-10-CM | POA: Diagnosis not present

## 2018-05-28 DIAGNOSIS — I5032 Chronic diastolic (congestive) heart failure: Secondary | ICD-10-CM | POA: Diagnosis not present

## 2018-05-28 DIAGNOSIS — R4781 Slurred speech: Secondary | ICD-10-CM | POA: Diagnosis not present

## 2018-05-29 DIAGNOSIS — J9602 Acute respiratory failure with hypercapnia: Secondary | ICD-10-CM | POA: Diagnosis not present

## 2018-05-29 DIAGNOSIS — I5032 Chronic diastolic (congestive) heart failure: Secondary | ICD-10-CM | POA: Diagnosis not present

## 2018-05-29 DIAGNOSIS — R4781 Slurred speech: Secondary | ICD-10-CM | POA: Diagnosis not present

## 2018-06-01 DIAGNOSIS — J9602 Acute respiratory failure with hypercapnia: Secondary | ICD-10-CM | POA: Diagnosis not present

## 2018-06-01 DIAGNOSIS — R4781 Slurred speech: Secondary | ICD-10-CM | POA: Diagnosis not present

## 2018-06-01 DIAGNOSIS — I5032 Chronic diastolic (congestive) heart failure: Secondary | ICD-10-CM | POA: Diagnosis not present

## 2018-06-02 DIAGNOSIS — I5032 Chronic diastolic (congestive) heart failure: Secondary | ICD-10-CM | POA: Diagnosis not present

## 2018-06-02 DIAGNOSIS — J9602 Acute respiratory failure with hypercapnia: Secondary | ICD-10-CM | POA: Diagnosis not present

## 2018-06-02 DIAGNOSIS — R4781 Slurred speech: Secondary | ICD-10-CM | POA: Diagnosis not present

## 2018-06-03 DIAGNOSIS — J9602 Acute respiratory failure with hypercapnia: Secondary | ICD-10-CM | POA: Diagnosis not present

## 2018-06-03 DIAGNOSIS — I5032 Chronic diastolic (congestive) heart failure: Secondary | ICD-10-CM | POA: Diagnosis not present

## 2018-06-03 DIAGNOSIS — R4781 Slurred speech: Secondary | ICD-10-CM | POA: Diagnosis not present

## 2018-06-04 DIAGNOSIS — J9602 Acute respiratory failure with hypercapnia: Secondary | ICD-10-CM | POA: Diagnosis not present

## 2018-06-04 DIAGNOSIS — R4781 Slurred speech: Secondary | ICD-10-CM | POA: Diagnosis not present

## 2018-06-04 DIAGNOSIS — I5032 Chronic diastolic (congestive) heart failure: Secondary | ICD-10-CM | POA: Diagnosis not present

## 2018-06-05 DIAGNOSIS — J9602 Acute respiratory failure with hypercapnia: Secondary | ICD-10-CM | POA: Diagnosis not present

## 2018-06-05 DIAGNOSIS — I5032 Chronic diastolic (congestive) heart failure: Secondary | ICD-10-CM | POA: Diagnosis not present

## 2018-06-05 DIAGNOSIS — R4781 Slurred speech: Secondary | ICD-10-CM | POA: Diagnosis not present

## 2018-06-08 DIAGNOSIS — R4781 Slurred speech: Secondary | ICD-10-CM | POA: Diagnosis not present

## 2018-06-08 DIAGNOSIS — I5032 Chronic diastolic (congestive) heart failure: Secondary | ICD-10-CM | POA: Diagnosis not present

## 2018-06-08 DIAGNOSIS — J9602 Acute respiratory failure with hypercapnia: Secondary | ICD-10-CM | POA: Diagnosis not present

## 2018-06-10 DIAGNOSIS — I5032 Chronic diastolic (congestive) heart failure: Secondary | ICD-10-CM | POA: Diagnosis not present

## 2018-06-10 DIAGNOSIS — N183 Chronic kidney disease, stage 3 (moderate): Secondary | ICD-10-CM | POA: Diagnosis not present

## 2018-06-10 DIAGNOSIS — I872 Venous insufficiency (chronic) (peripheral): Secondary | ICD-10-CM | POA: Diagnosis not present

## 2018-06-10 DIAGNOSIS — I10 Essential (primary) hypertension: Secondary | ICD-10-CM | POA: Diagnosis not present

## 2018-06-11 DIAGNOSIS — K219 Gastro-esophageal reflux disease without esophagitis: Secondary | ICD-10-CM | POA: Diagnosis not present

## 2018-06-11 DIAGNOSIS — I10 Essential (primary) hypertension: Secondary | ICD-10-CM | POA: Diagnosis not present

## 2018-06-11 DIAGNOSIS — I5032 Chronic diastolic (congestive) heart failure: Secondary | ICD-10-CM | POA: Diagnosis not present

## 2018-06-11 DIAGNOSIS — I872 Venous insufficiency (chronic) (peripheral): Secondary | ICD-10-CM | POA: Diagnosis not present

## 2018-06-12 DIAGNOSIS — F423 Hoarding disorder: Secondary | ICD-10-CM | POA: Diagnosis not present

## 2018-06-12 DIAGNOSIS — F331 Major depressive disorder, recurrent, moderate: Secondary | ICD-10-CM | POA: Diagnosis not present

## 2018-06-22 DIAGNOSIS — N183 Chronic kidney disease, stage 3 (moderate): Secondary | ICD-10-CM | POA: Diagnosis not present

## 2018-06-22 DIAGNOSIS — R05 Cough: Secondary | ICD-10-CM | POA: Diagnosis not present

## 2018-06-22 DIAGNOSIS — I5032 Chronic diastolic (congestive) heart failure: Secondary | ICD-10-CM | POA: Diagnosis not present

## 2018-06-23 DIAGNOSIS — I5020 Unspecified systolic (congestive) heart failure: Secondary | ICD-10-CM | POA: Diagnosis not present

## 2018-06-23 DIAGNOSIS — D649 Anemia, unspecified: Secondary | ICD-10-CM | POA: Diagnosis not present

## 2018-07-07 DIAGNOSIS — E119 Type 2 diabetes mellitus without complications: Secondary | ICD-10-CM | POA: Diagnosis not present

## 2018-07-07 DIAGNOSIS — D649 Anemia, unspecified: Secondary | ICD-10-CM | POA: Diagnosis not present

## 2018-07-08 DIAGNOSIS — N183 Chronic kidney disease, stage 3 (moderate): Secondary | ICD-10-CM | POA: Diagnosis not present

## 2018-07-08 DIAGNOSIS — I10 Essential (primary) hypertension: Secondary | ICD-10-CM | POA: Diagnosis not present

## 2018-07-08 DIAGNOSIS — I872 Venous insufficiency (chronic) (peripheral): Secondary | ICD-10-CM | POA: Diagnosis not present

## 2018-07-08 DIAGNOSIS — I5032 Chronic diastolic (congestive) heart failure: Secondary | ICD-10-CM | POA: Diagnosis not present

## 2018-07-14 DIAGNOSIS — I10 Essential (primary) hypertension: Secondary | ICD-10-CM | POA: Diagnosis not present

## 2018-07-14 DIAGNOSIS — I5032 Chronic diastolic (congestive) heart failure: Secondary | ICD-10-CM | POA: Diagnosis not present

## 2018-07-14 DIAGNOSIS — R100 Acute abdomen: Secondary | ICD-10-CM | POA: Diagnosis not present

## 2018-07-14 DIAGNOSIS — E119 Type 2 diabetes mellitus without complications: Secondary | ICD-10-CM | POA: Diagnosis not present

## 2018-07-15 DIAGNOSIS — E119 Type 2 diabetes mellitus without complications: Secondary | ICD-10-CM | POA: Diagnosis not present

## 2018-07-15 DIAGNOSIS — I5032 Chronic diastolic (congestive) heart failure: Secondary | ICD-10-CM | POA: Diagnosis not present

## 2018-07-15 DIAGNOSIS — E785 Hyperlipidemia, unspecified: Secondary | ICD-10-CM | POA: Diagnosis not present

## 2018-07-15 DIAGNOSIS — R109 Unspecified abdominal pain: Secondary | ICD-10-CM | POA: Diagnosis not present

## 2018-07-15 DIAGNOSIS — I509 Heart failure, unspecified: Secondary | ICD-10-CM | POA: Diagnosis not present

## 2018-07-15 DIAGNOSIS — D649 Anemia, unspecified: Secondary | ICD-10-CM | POA: Diagnosis not present

## 2018-07-20 DIAGNOSIS — F329 Major depressive disorder, single episode, unspecified: Secondary | ICD-10-CM | POA: Diagnosis not present

## 2018-08-05 DIAGNOSIS — N183 Chronic kidney disease, stage 3 (moderate): Secondary | ICD-10-CM | POA: Diagnosis not present

## 2018-08-05 DIAGNOSIS — I10 Essential (primary) hypertension: Secondary | ICD-10-CM | POA: Diagnosis not present

## 2018-08-05 DIAGNOSIS — I872 Venous insufficiency (chronic) (peripheral): Secondary | ICD-10-CM | POA: Diagnosis not present

## 2018-08-05 DIAGNOSIS — I5032 Chronic diastolic (congestive) heart failure: Secondary | ICD-10-CM | POA: Diagnosis not present

## 2018-08-06 DIAGNOSIS — E119 Type 2 diabetes mellitus without complications: Secondary | ICD-10-CM | POA: Diagnosis not present

## 2018-08-06 DIAGNOSIS — D649 Anemia, unspecified: Secondary | ICD-10-CM | POA: Diagnosis not present

## 2018-08-06 DIAGNOSIS — E559 Vitamin D deficiency, unspecified: Secondary | ICD-10-CM | POA: Diagnosis not present

## 2018-08-06 DIAGNOSIS — Z79899 Other long term (current) drug therapy: Secondary | ICD-10-CM | POA: Diagnosis not present

## 2018-08-07 DIAGNOSIS — F423 Hoarding disorder: Secondary | ICD-10-CM | POA: Diagnosis not present

## 2018-08-07 DIAGNOSIS — F331 Major depressive disorder, recurrent, moderate: Secondary | ICD-10-CM | POA: Diagnosis not present

## 2018-08-11 DIAGNOSIS — F329 Major depressive disorder, single episode, unspecified: Secondary | ICD-10-CM | POA: Diagnosis not present

## 2018-08-18 DIAGNOSIS — F329 Major depressive disorder, single episode, unspecified: Secondary | ICD-10-CM | POA: Diagnosis not present

## 2018-08-19 DIAGNOSIS — K922 Gastrointestinal hemorrhage, unspecified: Secondary | ICD-10-CM | POA: Diagnosis not present

## 2018-08-19 DIAGNOSIS — K219 Gastro-esophageal reflux disease without esophagitis: Secondary | ICD-10-CM | POA: Diagnosis not present

## 2018-08-19 DIAGNOSIS — R1032 Left lower quadrant pain: Secondary | ICD-10-CM | POA: Diagnosis not present

## 2018-08-19 DIAGNOSIS — M6281 Muscle weakness (generalized): Secondary | ICD-10-CM | POA: Diagnosis not present

## 2018-08-20 DIAGNOSIS — D649 Anemia, unspecified: Secondary | ICD-10-CM | POA: Diagnosis not present

## 2018-08-20 DIAGNOSIS — R319 Hematuria, unspecified: Secondary | ICD-10-CM | POA: Diagnosis not present

## 2018-08-22 DIAGNOSIS — K922 Gastrointestinal hemorrhage, unspecified: Secondary | ICD-10-CM | POA: Diagnosis not present

## 2018-08-22 DIAGNOSIS — E119 Type 2 diabetes mellitus without complications: Secondary | ICD-10-CM | POA: Diagnosis not present

## 2018-08-22 DIAGNOSIS — R1032 Left lower quadrant pain: Secondary | ICD-10-CM | POA: Diagnosis not present

## 2018-08-22 DIAGNOSIS — I10 Essential (primary) hypertension: Secondary | ICD-10-CM | POA: Diagnosis not present

## 2018-08-24 ENCOUNTER — Telehealth: Payer: Self-pay | Admitting: Gastroenterology

## 2018-08-24 NOTE — Telephone Encounter (Signed)
Pat scheduler for residents phone # is [phone].

## 2018-08-25 NOTE — Telephone Encounter (Signed)
I spoke to Dr. Westley Gambles.  Patient had a single episode of LLQ pain and BRBPR, Hgb stable at 12.  He ordered CT scan and gave antibiotics. Chronically ill woman on chronic supplemental oxygen and other complex medical issues residing at [facility].  He requested office evaluation.  Please make next available appt with any provider (may need to be an APP if soonest available).  Dr. Darene Lamer will have his staff send his most recent note, last few CBC and CT scan report to [place].

## 2018-08-26 ENCOUNTER — Other Ambulatory Visit (HOSPITAL_COMMUNITY): Payer: Self-pay | Admitting: Internal Medicine

## 2018-08-26 ENCOUNTER — Other Ambulatory Visit: Payer: Self-pay | Admitting: Internal Medicine

## 2018-08-26 DIAGNOSIS — K5792 Diverticulitis of intestine, part unspecified, without perforation or abscess without bleeding: Secondary | ICD-10-CM

## 2018-08-26 NOTE — Telephone Encounter (Signed)
Patient has been scheduled to see Alonza Bogus, PA-C on 08/28/18 at 9am.

## 2018-08-27 DIAGNOSIS — D649 Anemia, unspecified: Secondary | ICD-10-CM | POA: Diagnosis not present

## 2018-08-27 DIAGNOSIS — N183 Chronic kidney disease, stage 3 (moderate): Secondary | ICD-10-CM | POA: Diagnosis not present

## 2018-08-28 ENCOUNTER — Ambulatory Visit (INDEPENDENT_AMBULATORY_CARE_PROVIDER_SITE_OTHER): Payer: Medicare Other | Admitting: Gastroenterology

## 2018-08-28 ENCOUNTER — Encounter (INDEPENDENT_AMBULATORY_CARE_PROVIDER_SITE_OTHER): Payer: Self-pay

## 2018-08-28 ENCOUNTER — Encounter: Payer: Self-pay | Admitting: Gastroenterology

## 2018-08-28 VITALS — BP 116/70 | HR 66 | Ht 63.5 in | Wt 265.0 lb

## 2018-08-28 DIAGNOSIS — R1084 Generalized abdominal pain: Secondary | ICD-10-CM | POA: Diagnosis not present

## 2018-08-28 NOTE — Patient Instructions (Addendum)
We will fax the office note from today to [place].  We will await the results of the CT scan, scheduled for 08-31-2018.  She may need a GYN referral.
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
## 2018-08-31 ENCOUNTER — Ambulatory Visit (HOSPITAL_COMMUNITY)
Admission: RE | Admit: 2018-08-31 | Discharge: 2018-08-31 | Disposition: A | Discharge status: Home / Self Care | Payer: Medicare Other | Source: Ambulatory Visit | Attending: Internal Medicine | Admitting: Internal Medicine

## 2018-08-31 DIAGNOSIS — Y848 Other medical procedures as the cause of abnormal reaction of the patient, or of later complication, without mention of misadventure at the time of the procedure: Secondary | ICD-10-CM | POA: Insufficient documentation

## 2018-08-31 DIAGNOSIS — R590 Localized enlarged lymph nodes: Secondary | ICD-10-CM | POA: Diagnosis not present

## 2018-08-31 DIAGNOSIS — K429 Umbilical hernia without obstruction or gangrene: Secondary | ICD-10-CM | POA: Diagnosis not present

## 2018-08-31 DIAGNOSIS — I517 Cardiomegaly: Secondary | ICD-10-CM | POA: Diagnosis not present

## 2018-08-31 DIAGNOSIS — M5135 Other intervertebral disc degeneration, thoracolumbar region: Secondary | ICD-10-CM | POA: Diagnosis not present

## 2018-08-31 DIAGNOSIS — K5792 Diverticulitis of intestine, part unspecified, without perforation or abscess without bleeding: Secondary | ICD-10-CM | POA: Insufficient documentation

## 2018-08-31 DIAGNOSIS — T8332XA Displacement of intrauterine contraceptive device, initial encounter: Secondary | ICD-10-CM | POA: Insufficient documentation

## 2018-08-31 DIAGNOSIS — N2889 Other specified disorders of kidney and ureter: Secondary | ICD-10-CM | POA: Insufficient documentation

## 2018-08-31 DIAGNOSIS — M5136 Other intervertebral disc degeneration, lumbar region: Secondary | ICD-10-CM | POA: Insufficient documentation

## 2018-08-31 DIAGNOSIS — I700 Atherosclerosis of aorta: Secondary | ICD-10-CM | POA: Insufficient documentation

## 2018-08-31 DIAGNOSIS — M47895 Other spondylosis, thoracolumbar region: Secondary | ICD-10-CM | POA: Diagnosis not present

## 2018-08-31 DIAGNOSIS — D259 Leiomyoma of uterus, unspecified: Secondary | ICD-10-CM | POA: Diagnosis not present

## 2018-08-31 DIAGNOSIS — K5732 Diverticulitis of large intestine without perforation or abscess without bleeding: Secondary | ICD-10-CM | POA: Diagnosis not present

## 2018-08-31 DIAGNOSIS — M5134 Other intervertebral disc degeneration, thoracic region: Secondary | ICD-10-CM | POA: Insufficient documentation

## 2018-08-31 DIAGNOSIS — Z780 Asymptomatic menopausal state: Secondary | ICD-10-CM | POA: Diagnosis not present

## 2018-08-31 DIAGNOSIS — N950 Postmenopausal bleeding: Secondary | ICD-10-CM | POA: Diagnosis not present

## 2018-08-31 MED ORDER — IOHEXOL 300 MG/ML  SOLN
75.0000 mL | Freq: Once | INTRAMUSCULAR | Status: AC | PRN
  Administered 2018-08-31: 75 mL via INTRAVENOUS

## 2018-08-31 MED ORDER — SODIUM CHLORIDE (PF) 0.9 % IJ SOLN
INTRAMUSCULAR | Status: AC
  Filled 2018-08-31: qty 50

## 2018-09-03 DIAGNOSIS — F329 Major depressive disorder, single episode, unspecified: Secondary | ICD-10-CM | POA: Diagnosis not present

## 2018-09-04 DIAGNOSIS — F423 Hoarding disorder: Secondary | ICD-10-CM | POA: Diagnosis not present

## 2018-09-04 DIAGNOSIS — F331 Major depressive disorder, recurrent, moderate: Secondary | ICD-10-CM | POA: Diagnosis not present

## 2018-09-09 DIAGNOSIS — G8929 Other chronic pain: Secondary | ICD-10-CM | POA: Diagnosis not present

## 2018-09-09 DIAGNOSIS — M6281 Muscle weakness (generalized): Secondary | ICD-10-CM | POA: Diagnosis not present

## 2018-09-09 DIAGNOSIS — R1084 Generalized abdominal pain: Secondary | ICD-10-CM | POA: Insufficient documentation

## 2018-09-09 DIAGNOSIS — I5032 Chronic diastolic (congestive) heart failure: Secondary | ICD-10-CM | POA: Diagnosis not present

## 2018-09-14 ENCOUNTER — Telehealth: Payer: Self-pay

## 2018-09-14 NOTE — Progress Notes (Signed)
Thank you for sending this case to me. I have reviewed the entire note, and the outlined plan seems appropriate.  Agree that she is a poor candidate for colonoscopy, and would not pursue that unless there is a convincing, high-risk finding on CT scan.   Wilfrid Lund, MD

## 2018-09-14 NOTE — Telephone Encounter (Signed)
I tried to reach the pt but the number has been disconnected.  Will mail letter to the pt.

## 2018-09-14 NOTE — Telephone Encounter (Signed)
-----   Message from Loralie Champagne, PA-C sent at 09/09/2018  2:20 PM EST ----- Please let the patient know that I reviewed her CT scan that she had done last week.  It does not show any GI issues, but does show some gynecologic issues particularly fibroids and IUD that is malpositioned.  Likely will need gynecologic follow-up as her bleeding very well could have been GYN related.  Also showed some renal cysts which will need follow-up imaging.  This needs to be addressed with her PCP and they may want her to see urology in regards to this as well.  Thank you,  Jess

## 2018-09-15 DIAGNOSIS — L853 Xerosis cutis: Secondary | ICD-10-CM | POA: Diagnosis not present

## 2018-09-15 DIAGNOSIS — Z9049 Acquired absence of other specified parts of digestive tract: Secondary | ICD-10-CM | POA: Diagnosis not present

## 2018-09-15 DIAGNOSIS — M6281 Muscle weakness (generalized): Secondary | ICD-10-CM | POA: Diagnosis not present

## 2018-09-15 DIAGNOSIS — I739 Peripheral vascular disease, unspecified: Secondary | ICD-10-CM | POA: Diagnosis not present

## 2018-09-15 DIAGNOSIS — G308 Other Alzheimer's disease: Secondary | ICD-10-CM | POA: Diagnosis not present

## 2018-09-15 DIAGNOSIS — Q845 Enlarged and hypertrophic nails: Secondary | ICD-10-CM | POA: Diagnosis not present

## 2018-09-15 DIAGNOSIS — L603 Nail dystrophy: Secondary | ICD-10-CM | POA: Diagnosis not present

## 2018-09-15 DIAGNOSIS — B351 Tinea unguium: Secondary | ICD-10-CM | POA: Diagnosis not present

## 2018-09-15 DIAGNOSIS — R293 Abnormal posture: Secondary | ICD-10-CM | POA: Diagnosis not present

## 2018-09-15 DIAGNOSIS — M2010 Hallux valgus (acquired), unspecified foot: Secondary | ICD-10-CM | POA: Diagnosis not present

## 2018-09-15 DIAGNOSIS — L89623 Pressure ulcer of left heel, stage 3: Secondary | ICD-10-CM | POA: Diagnosis not present

## 2018-09-15 DIAGNOSIS — N183 Chronic kidney disease, stage 3 (moderate): Secondary | ICD-10-CM | POA: Diagnosis not present

## 2018-09-15 DIAGNOSIS — R4781 Slurred speech: Secondary | ICD-10-CM | POA: Diagnosis not present

## 2018-09-15 DIAGNOSIS — F329 Major depressive disorder, single episode, unspecified: Secondary | ICD-10-CM | POA: Diagnosis not present

## 2018-09-15 DIAGNOSIS — S82876D Nondisplaced pilon fracture of unspecified tibia, subsequent encounter for closed fracture with routine healing: Secondary | ICD-10-CM | POA: Diagnosis not present

## 2018-09-15 DIAGNOSIS — R498 Other voice and resonance disorders: Secondary | ICD-10-CM | POA: Diagnosis not present

## 2018-09-16 DIAGNOSIS — M6281 Muscle weakness (generalized): Secondary | ICD-10-CM | POA: Diagnosis not present

## 2018-09-16 DIAGNOSIS — R498 Other voice and resonance disorders: Secondary | ICD-10-CM | POA: Diagnosis not present

## 2018-09-16 DIAGNOSIS — G8929 Other chronic pain: Secondary | ICD-10-CM | POA: Diagnosis not present

## 2018-09-16 DIAGNOSIS — I87312 Chronic venous hypertension (idiopathic) with ulcer of left lower extremity: Secondary | ICD-10-CM | POA: Diagnosis not present

## 2018-09-16 DIAGNOSIS — G308 Other Alzheimer's disease: Secondary | ICD-10-CM | POA: Diagnosis not present

## 2018-09-16 DIAGNOSIS — N183 Chronic kidney disease, stage 3 (moderate): Secondary | ICD-10-CM | POA: Diagnosis not present

## 2018-09-16 DIAGNOSIS — L89623 Pressure ulcer of left heel, stage 3: Secondary | ICD-10-CM | POA: Diagnosis not present

## 2018-09-16 DIAGNOSIS — E119 Type 2 diabetes mellitus without complications: Secondary | ICD-10-CM | POA: Diagnosis not present

## 2018-09-16 DIAGNOSIS — R293 Abnormal posture: Secondary | ICD-10-CM | POA: Diagnosis not present

## 2018-09-17 DIAGNOSIS — R293 Abnormal posture: Secondary | ICD-10-CM | POA: Diagnosis not present

## 2018-09-17 DIAGNOSIS — L89623 Pressure ulcer of left heel, stage 3: Secondary | ICD-10-CM | POA: Diagnosis not present

## 2018-09-17 DIAGNOSIS — M6281 Muscle weakness (generalized): Secondary | ICD-10-CM | POA: Diagnosis not present

## 2018-09-17 DIAGNOSIS — N183 Chronic kidney disease, stage 3 (moderate): Secondary | ICD-10-CM | POA: Diagnosis not present

## 2018-09-17 DIAGNOSIS — R498 Other voice and resonance disorders: Secondary | ICD-10-CM | POA: Diagnosis not present

## 2018-09-17 DIAGNOSIS — G308 Other Alzheimer's disease: Secondary | ICD-10-CM | POA: Diagnosis not present

## 2018-09-18 DIAGNOSIS — R498 Other voice and resonance disorders: Secondary | ICD-10-CM | POA: Diagnosis not present

## 2018-09-18 DIAGNOSIS — R293 Abnormal posture: Secondary | ICD-10-CM | POA: Diagnosis not present

## 2018-09-18 DIAGNOSIS — N183 Chronic kidney disease, stage 3 (moderate): Secondary | ICD-10-CM | POA: Diagnosis not present

## 2018-09-18 DIAGNOSIS — M6281 Muscle weakness (generalized): Secondary | ICD-10-CM | POA: Diagnosis not present

## 2018-09-18 DIAGNOSIS — G308 Other Alzheimer's disease: Secondary | ICD-10-CM | POA: Diagnosis not present

## 2018-09-18 DIAGNOSIS — L89623 Pressure ulcer of left heel, stage 3: Secondary | ICD-10-CM | POA: Diagnosis not present

## 2018-09-20 DIAGNOSIS — N183 Chronic kidney disease, stage 3 (moderate): Secondary | ICD-10-CM | POA: Diagnosis not present

## 2018-09-20 DIAGNOSIS — R498 Other voice and resonance disorders: Secondary | ICD-10-CM | POA: Diagnosis not present

## 2018-09-20 DIAGNOSIS — G308 Other Alzheimer's disease: Secondary | ICD-10-CM | POA: Diagnosis not present

## 2018-09-20 DIAGNOSIS — M6281 Muscle weakness (generalized): Secondary | ICD-10-CM | POA: Diagnosis not present

## 2018-09-20 DIAGNOSIS — R293 Abnormal posture: Secondary | ICD-10-CM | POA: Diagnosis not present

## 2018-09-20 DIAGNOSIS — L89623 Pressure ulcer of left heel, stage 3: Secondary | ICD-10-CM | POA: Diagnosis not present

## 2018-09-21 ENCOUNTER — Telehealth: Payer: Self-pay | Admitting: Gastroenterology

## 2018-09-21 DIAGNOSIS — M6281 Muscle weakness (generalized): Secondary | ICD-10-CM | POA: Diagnosis not present

## 2018-09-21 DIAGNOSIS — L89623 Pressure ulcer of left heel, stage 3: Secondary | ICD-10-CM | POA: Diagnosis not present

## 2018-09-21 DIAGNOSIS — N183 Chronic kidney disease, stage 3 (moderate): Secondary | ICD-10-CM | POA: Diagnosis not present

## 2018-09-21 DIAGNOSIS — R498 Other voice and resonance disorders: Secondary | ICD-10-CM | POA: Diagnosis not present

## 2018-09-21 DIAGNOSIS — R293 Abnormal posture: Secondary | ICD-10-CM | POA: Diagnosis not present

## 2018-09-21 DIAGNOSIS — G308 Other Alzheimer's disease: Secondary | ICD-10-CM | POA: Diagnosis not present

## 2018-09-21 NOTE — Telephone Encounter (Signed)
[place] with [facility] was notified that we did not order the CT scan.  She was given Heather Zimmerman's recommendations to follow up with GYN and Urology

## 2018-09-21 NOTE — Telephone Encounter (Signed)
[facility] wants ct results on this patient.

## 2018-09-22 DIAGNOSIS — R498 Other voice and resonance disorders: Secondary | ICD-10-CM | POA: Diagnosis not present

## 2018-09-22 DIAGNOSIS — R293 Abnormal posture: Secondary | ICD-10-CM | POA: Diagnosis not present

## 2018-09-22 DIAGNOSIS — L89623 Pressure ulcer of left heel, stage 3: Secondary | ICD-10-CM | POA: Diagnosis not present

## 2018-09-22 DIAGNOSIS — G308 Other Alzheimer's disease: Secondary | ICD-10-CM | POA: Diagnosis not present

## 2018-09-22 DIAGNOSIS — M6281 Muscle weakness (generalized): Secondary | ICD-10-CM | POA: Diagnosis not present

## 2018-09-22 DIAGNOSIS — N183 Chronic kidney disease, stage 3 (moderate): Secondary | ICD-10-CM | POA: Diagnosis not present

## 2018-09-23 DIAGNOSIS — E119 Type 2 diabetes mellitus without complications: Secondary | ICD-10-CM | POA: Diagnosis not present

## 2018-09-23 DIAGNOSIS — G8929 Other chronic pain: Secondary | ICD-10-CM | POA: Diagnosis not present

## 2018-09-23 DIAGNOSIS — G308 Other Alzheimer's disease: Secondary | ICD-10-CM | POA: Diagnosis not present

## 2018-09-23 DIAGNOSIS — N183 Chronic kidney disease, stage 3 (moderate): Secondary | ICD-10-CM | POA: Diagnosis not present

## 2018-09-23 DIAGNOSIS — M6281 Muscle weakness (generalized): Secondary | ICD-10-CM | POA: Diagnosis not present

## 2018-09-23 DIAGNOSIS — L89623 Pressure ulcer of left heel, stage 3: Secondary | ICD-10-CM | POA: Diagnosis not present

## 2018-09-23 DIAGNOSIS — R293 Abnormal posture: Secondary | ICD-10-CM | POA: Diagnosis not present

## 2018-09-23 DIAGNOSIS — I87312 Chronic venous hypertension (idiopathic) with ulcer of left lower extremity: Secondary | ICD-10-CM | POA: Diagnosis not present

## 2018-09-23 DIAGNOSIS — R498 Other voice and resonance disorders: Secondary | ICD-10-CM | POA: Diagnosis not present

## 2018-09-24 DIAGNOSIS — G308 Other Alzheimer's disease: Secondary | ICD-10-CM | POA: Diagnosis not present

## 2018-09-24 DIAGNOSIS — R293 Abnormal posture: Secondary | ICD-10-CM | POA: Diagnosis not present

## 2018-09-24 DIAGNOSIS — R498 Other voice and resonance disorders: Secondary | ICD-10-CM | POA: Diagnosis not present

## 2018-09-24 DIAGNOSIS — F329 Major depressive disorder, single episode, unspecified: Secondary | ICD-10-CM | POA: Diagnosis not present

## 2018-09-24 DIAGNOSIS — K5792 Diverticulitis of intestine, part unspecified, without perforation or abscess without bleeding: Secondary | ICD-10-CM | POA: Diagnosis not present

## 2018-09-24 DIAGNOSIS — I10 Essential (primary) hypertension: Secondary | ICD-10-CM | POA: Diagnosis not present

## 2018-09-24 DIAGNOSIS — I5032 Chronic diastolic (congestive) heart failure: Secondary | ICD-10-CM | POA: Diagnosis not present

## 2018-09-24 DIAGNOSIS — M6281 Muscle weakness (generalized): Secondary | ICD-10-CM | POA: Diagnosis not present

## 2018-09-24 DIAGNOSIS — N183 Chronic kidney disease, stage 3 (moderate): Secondary | ICD-10-CM | POA: Diagnosis not present

## 2018-09-24 DIAGNOSIS — L89623 Pressure ulcer of left heel, stage 3: Secondary | ICD-10-CM | POA: Diagnosis not present

## 2018-09-24 DIAGNOSIS — E119 Type 2 diabetes mellitus without complications: Secondary | ICD-10-CM | POA: Diagnosis not present

## 2018-09-25 DIAGNOSIS — N183 Chronic kidney disease, stage 3 (moderate): Secondary | ICD-10-CM | POA: Diagnosis not present

## 2018-09-25 DIAGNOSIS — G308 Other Alzheimer's disease: Secondary | ICD-10-CM | POA: Diagnosis not present

## 2018-09-25 DIAGNOSIS — E119 Type 2 diabetes mellitus without complications: Secondary | ICD-10-CM | POA: Diagnosis not present

## 2018-09-25 DIAGNOSIS — M6281 Muscle weakness (generalized): Secondary | ICD-10-CM | POA: Diagnosis not present

## 2018-09-25 DIAGNOSIS — H40001 Preglaucoma, unspecified, right eye: Secondary | ICD-10-CM | POA: Diagnosis not present

## 2018-09-25 DIAGNOSIS — H50112 Monocular exotropia, left eye: Secondary | ICD-10-CM | POA: Diagnosis not present

## 2018-09-25 DIAGNOSIS — L89623 Pressure ulcer of left heel, stage 3: Secondary | ICD-10-CM | POA: Diagnosis not present

## 2018-09-25 DIAGNOSIS — R293 Abnormal posture: Secondary | ICD-10-CM | POA: Diagnosis not present

## 2018-09-25 DIAGNOSIS — H2513 Age-related nuclear cataract, bilateral: Secondary | ICD-10-CM | POA: Diagnosis not present

## 2018-09-25 DIAGNOSIS — R498 Other voice and resonance disorders: Secondary | ICD-10-CM | POA: Diagnosis not present

## 2018-09-27 DIAGNOSIS — L89623 Pressure ulcer of left heel, stage 3: Secondary | ICD-10-CM | POA: Diagnosis not present

## 2018-09-27 DIAGNOSIS — R498 Other voice and resonance disorders: Secondary | ICD-10-CM | POA: Diagnosis not present

## 2018-09-27 DIAGNOSIS — N183 Chronic kidney disease, stage 3 (moderate): Secondary | ICD-10-CM | POA: Diagnosis not present

## 2018-09-27 DIAGNOSIS — G308 Other Alzheimer's disease: Secondary | ICD-10-CM | POA: Diagnosis not present

## 2018-09-27 DIAGNOSIS — R293 Abnormal posture: Secondary | ICD-10-CM | POA: Diagnosis not present

## 2018-09-27 DIAGNOSIS — M6281 Muscle weakness (generalized): Secondary | ICD-10-CM | POA: Diagnosis not present

## 2018-09-28 DIAGNOSIS — R498 Other voice and resonance disorders: Secondary | ICD-10-CM | POA: Diagnosis not present

## 2018-09-28 DIAGNOSIS — N183 Chronic kidney disease, stage 3 (moderate): Secondary | ICD-10-CM | POA: Diagnosis not present

## 2018-09-28 DIAGNOSIS — G308 Other Alzheimer's disease: Secondary | ICD-10-CM | POA: Diagnosis not present

## 2018-09-28 DIAGNOSIS — R293 Abnormal posture: Secondary | ICD-10-CM | POA: Diagnosis not present

## 2018-09-28 DIAGNOSIS — L89623 Pressure ulcer of left heel, stage 3: Secondary | ICD-10-CM | POA: Diagnosis not present

## 2018-09-28 DIAGNOSIS — M6281 Muscle weakness (generalized): Secondary | ICD-10-CM | POA: Diagnosis not present

## 2018-09-29 DIAGNOSIS — M6281 Muscle weakness (generalized): Secondary | ICD-10-CM | POA: Diagnosis not present

## 2018-09-29 DIAGNOSIS — N183 Chronic kidney disease, stage 3 (moderate): Secondary | ICD-10-CM | POA: Diagnosis not present

## 2018-09-29 DIAGNOSIS — R498 Other voice and resonance disorders: Secondary | ICD-10-CM | POA: Diagnosis not present

## 2018-09-29 DIAGNOSIS — G308 Other Alzheimer's disease: Secondary | ICD-10-CM | POA: Diagnosis not present

## 2018-09-29 DIAGNOSIS — L89623 Pressure ulcer of left heel, stage 3: Secondary | ICD-10-CM | POA: Diagnosis not present

## 2018-09-29 DIAGNOSIS — R293 Abnormal posture: Secondary | ICD-10-CM | POA: Diagnosis not present

## 2018-09-30 DIAGNOSIS — R498 Other voice and resonance disorders: Secondary | ICD-10-CM | POA: Diagnosis not present

## 2018-09-30 DIAGNOSIS — E119 Type 2 diabetes mellitus without complications: Secondary | ICD-10-CM | POA: Diagnosis not present

## 2018-09-30 DIAGNOSIS — R293 Abnormal posture: Secondary | ICD-10-CM | POA: Diagnosis not present

## 2018-09-30 DIAGNOSIS — N183 Chronic kidney disease, stage 3 (moderate): Secondary | ICD-10-CM | POA: Diagnosis not present

## 2018-09-30 DIAGNOSIS — G308 Other Alzheimer's disease: Secondary | ICD-10-CM | POA: Diagnosis not present

## 2018-09-30 DIAGNOSIS — M6281 Muscle weakness (generalized): Secondary | ICD-10-CM | POA: Diagnosis not present

## 2018-09-30 DIAGNOSIS — G8929 Other chronic pain: Secondary | ICD-10-CM | POA: Diagnosis not present

## 2018-09-30 DIAGNOSIS — I87312 Chronic venous hypertension (idiopathic) with ulcer of left lower extremity: Secondary | ICD-10-CM | POA: Diagnosis not present

## 2018-09-30 DIAGNOSIS — F329 Major depressive disorder, single episode, unspecified: Secondary | ICD-10-CM | POA: Diagnosis not present

## 2018-09-30 DIAGNOSIS — L89623 Pressure ulcer of left heel, stage 3: Secondary | ICD-10-CM | POA: Diagnosis not present

## 2018-10-01 DIAGNOSIS — L89623 Pressure ulcer of left heel, stage 3: Secondary | ICD-10-CM | POA: Diagnosis not present

## 2018-10-01 DIAGNOSIS — R293 Abnormal posture: Secondary | ICD-10-CM | POA: Diagnosis not present

## 2018-10-01 DIAGNOSIS — N183 Chronic kidney disease, stage 3 (moderate): Secondary | ICD-10-CM | POA: Diagnosis not present

## 2018-10-01 DIAGNOSIS — G308 Other Alzheimer's disease: Secondary | ICD-10-CM | POA: Diagnosis not present

## 2018-10-01 DIAGNOSIS — R498 Other voice and resonance disorders: Secondary | ICD-10-CM | POA: Diagnosis not present

## 2018-10-01 DIAGNOSIS — M6281 Muscle weakness (generalized): Secondary | ICD-10-CM | POA: Diagnosis not present

## 2018-10-02 DIAGNOSIS — N183 Chronic kidney disease, stage 3 (moderate): Secondary | ICD-10-CM | POA: Diagnosis not present

## 2018-10-02 DIAGNOSIS — G308 Other Alzheimer's disease: Secondary | ICD-10-CM | POA: Diagnosis not present

## 2018-10-02 DIAGNOSIS — R293 Abnormal posture: Secondary | ICD-10-CM | POA: Diagnosis not present

## 2018-10-02 DIAGNOSIS — R498 Other voice and resonance disorders: Secondary | ICD-10-CM | POA: Diagnosis not present

## 2018-10-02 DIAGNOSIS — L89623 Pressure ulcer of left heel, stage 3: Secondary | ICD-10-CM | POA: Diagnosis not present

## 2018-10-02 DIAGNOSIS — M6281 Muscle weakness (generalized): Secondary | ICD-10-CM | POA: Diagnosis not present

## 2018-10-04 DIAGNOSIS — M6281 Muscle weakness (generalized): Secondary | ICD-10-CM | POA: Diagnosis not present

## 2018-10-04 DIAGNOSIS — G308 Other Alzheimer's disease: Secondary | ICD-10-CM | POA: Diagnosis not present

## 2018-10-04 DIAGNOSIS — R293 Abnormal posture: Secondary | ICD-10-CM | POA: Diagnosis not present

## 2018-10-04 DIAGNOSIS — N183 Chronic kidney disease, stage 3 (moderate): Secondary | ICD-10-CM | POA: Diagnosis not present

## 2018-10-04 DIAGNOSIS — L89623 Pressure ulcer of left heel, stage 3: Secondary | ICD-10-CM | POA: Diagnosis not present

## 2018-10-04 DIAGNOSIS — R498 Other voice and resonance disorders: Secondary | ICD-10-CM | POA: Diagnosis not present

## 2018-10-05 DIAGNOSIS — I5032 Chronic diastolic (congestive) heart failure: Secondary | ICD-10-CM | POA: Diagnosis not present

## 2018-10-05 DIAGNOSIS — R498 Other voice and resonance disorders: Secondary | ICD-10-CM | POA: Diagnosis not present

## 2018-10-05 DIAGNOSIS — I87312 Chronic venous hypertension (idiopathic) with ulcer of left lower extremity: Secondary | ICD-10-CM | POA: Diagnosis not present

## 2018-10-05 DIAGNOSIS — M6281 Muscle weakness (generalized): Secondary | ICD-10-CM | POA: Diagnosis not present

## 2018-10-05 DIAGNOSIS — G308 Other Alzheimer's disease: Secondary | ICD-10-CM | POA: Diagnosis not present

## 2018-10-05 DIAGNOSIS — G8929 Other chronic pain: Secondary | ICD-10-CM | POA: Diagnosis not present

## 2018-10-05 DIAGNOSIS — E119 Type 2 diabetes mellitus without complications: Secondary | ICD-10-CM | POA: Diagnosis not present

## 2018-10-05 DIAGNOSIS — F329 Major depressive disorder, single episode, unspecified: Secondary | ICD-10-CM | POA: Diagnosis not present

## 2018-10-05 DIAGNOSIS — L89623 Pressure ulcer of left heel, stage 3: Secondary | ICD-10-CM | POA: Diagnosis not present

## 2018-10-05 DIAGNOSIS — N183 Chronic kidney disease, stage 3 (moderate): Secondary | ICD-10-CM | POA: Diagnosis not present

## 2018-10-05 DIAGNOSIS — R293 Abnormal posture: Secondary | ICD-10-CM | POA: Diagnosis not present

## 2018-10-06 DIAGNOSIS — R293 Abnormal posture: Secondary | ICD-10-CM | POA: Diagnosis not present

## 2018-10-06 DIAGNOSIS — R498 Other voice and resonance disorders: Secondary | ICD-10-CM | POA: Diagnosis not present

## 2018-10-06 DIAGNOSIS — L89623 Pressure ulcer of left heel, stage 3: Secondary | ICD-10-CM | POA: Diagnosis not present

## 2018-10-06 DIAGNOSIS — N183 Chronic kidney disease, stage 3 (moderate): Secondary | ICD-10-CM | POA: Diagnosis not present

## 2018-10-06 DIAGNOSIS — I5032 Chronic diastolic (congestive) heart failure: Secondary | ICD-10-CM | POA: Diagnosis not present

## 2018-10-06 DIAGNOSIS — G308 Other Alzheimer's disease: Secondary | ICD-10-CM | POA: Diagnosis not present

## 2018-10-06 DIAGNOSIS — M6281 Muscle weakness (generalized): Secondary | ICD-10-CM | POA: Diagnosis not present

## 2018-10-06 DIAGNOSIS — K219 Gastro-esophageal reflux disease without esophagitis: Secondary | ICD-10-CM | POA: Diagnosis not present

## 2018-10-08 DIAGNOSIS — L89623 Pressure ulcer of left heel, stage 3: Secondary | ICD-10-CM | POA: Diagnosis not present

## 2018-10-08 DIAGNOSIS — M6281 Muscle weakness (generalized): Secondary | ICD-10-CM | POA: Diagnosis not present

## 2018-10-08 DIAGNOSIS — R498 Other voice and resonance disorders: Secondary | ICD-10-CM | POA: Diagnosis not present

## 2018-10-08 DIAGNOSIS — R293 Abnormal posture: Secondary | ICD-10-CM | POA: Diagnosis not present

## 2018-10-08 DIAGNOSIS — G308 Other Alzheimer's disease: Secondary | ICD-10-CM | POA: Diagnosis not present

## 2018-10-08 DIAGNOSIS — N183 Chronic kidney disease, stage 3 (moderate): Secondary | ICD-10-CM | POA: Diagnosis not present

## 2018-10-09 DIAGNOSIS — R498 Other voice and resonance disorders: Secondary | ICD-10-CM | POA: Diagnosis not present

## 2018-10-09 DIAGNOSIS — G308 Other Alzheimer's disease: Secondary | ICD-10-CM | POA: Diagnosis not present

## 2018-10-09 DIAGNOSIS — R293 Abnormal posture: Secondary | ICD-10-CM | POA: Diagnosis not present

## 2018-10-09 DIAGNOSIS — L89623 Pressure ulcer of left heel, stage 3: Secondary | ICD-10-CM | POA: Diagnosis not present

## 2018-10-09 DIAGNOSIS — N183 Chronic kidney disease, stage 3 (moderate): Secondary | ICD-10-CM | POA: Diagnosis not present

## 2018-10-09 DIAGNOSIS — M6281 Muscle weakness (generalized): Secondary | ICD-10-CM | POA: Diagnosis not present

## 2018-10-10 DIAGNOSIS — M6281 Muscle weakness (generalized): Secondary | ICD-10-CM | POA: Diagnosis not present

## 2018-10-10 DIAGNOSIS — G308 Other Alzheimer's disease: Secondary | ICD-10-CM | POA: Diagnosis not present

## 2018-10-10 DIAGNOSIS — R293 Abnormal posture: Secondary | ICD-10-CM | POA: Diagnosis not present

## 2018-10-10 DIAGNOSIS — L89623 Pressure ulcer of left heel, stage 3: Secondary | ICD-10-CM | POA: Diagnosis not present

## 2018-10-10 DIAGNOSIS — R498 Other voice and resonance disorders: Secondary | ICD-10-CM | POA: Diagnosis not present

## 2018-10-10 DIAGNOSIS — N183 Chronic kidney disease, stage 3 (moderate): Secondary | ICD-10-CM | POA: Diagnosis not present

## 2018-10-12 DIAGNOSIS — G308 Other Alzheimer's disease: Secondary | ICD-10-CM | POA: Diagnosis not present

## 2018-10-12 DIAGNOSIS — R293 Abnormal posture: Secondary | ICD-10-CM | POA: Diagnosis not present

## 2018-10-12 DIAGNOSIS — M6281 Muscle weakness (generalized): Secondary | ICD-10-CM | POA: Diagnosis not present

## 2018-10-12 DIAGNOSIS — N183 Chronic kidney disease, stage 3 (moderate): Secondary | ICD-10-CM | POA: Diagnosis not present

## 2018-10-12 DIAGNOSIS — R498 Other voice and resonance disorders: Secondary | ICD-10-CM | POA: Diagnosis not present

## 2018-10-12 DIAGNOSIS — L89623 Pressure ulcer of left heel, stage 3: Secondary | ICD-10-CM | POA: Diagnosis not present

## 2018-10-13 ENCOUNTER — Other Ambulatory Visit: Payer: Self-pay

## 2018-10-13 DIAGNOSIS — L89623 Pressure ulcer of left heel, stage 3: Secondary | ICD-10-CM | POA: Diagnosis not present

## 2018-10-13 DIAGNOSIS — G308 Other Alzheimer's disease: Secondary | ICD-10-CM | POA: Diagnosis not present

## 2018-10-13 DIAGNOSIS — R498 Other voice and resonance disorders: Secondary | ICD-10-CM | POA: Diagnosis not present

## 2018-10-13 DIAGNOSIS — R293 Abnormal posture: Secondary | ICD-10-CM | POA: Diagnosis not present

## 2018-10-13 DIAGNOSIS — F329 Major depressive disorder, single episode, unspecified: Secondary | ICD-10-CM | POA: Diagnosis not present

## 2018-10-13 DIAGNOSIS — M6281 Muscle weakness (generalized): Secondary | ICD-10-CM | POA: Diagnosis not present

## 2018-10-13 DIAGNOSIS — N183 Chronic kidney disease, stage 3 (moderate): Secondary | ICD-10-CM | POA: Diagnosis not present

## 2018-10-14 ENCOUNTER — Encounter (HOSPITAL_COMMUNITY): Payer: Self-pay | Admitting: Emergency Medicine

## 2018-10-14 ENCOUNTER — Inpatient Hospital Stay (HOSPITAL_COMMUNITY)
Admission: EM | Admit: 2018-10-14 | Discharge: 2018-10-18 | DRG: 871 | Disposition: A | Discharge status: Skilled Nursing Facility | Payer: Medicare Other | Attending: Family Medicine | Admitting: Family Medicine

## 2018-10-14 ENCOUNTER — Emergency Department (HOSPITAL_COMMUNITY): Payer: Medicare Other

## 2018-10-14 ENCOUNTER — Other Ambulatory Visit: Payer: Self-pay

## 2018-10-14 DIAGNOSIS — I5032 Chronic diastolic (congestive) heart failure: Secondary | ICD-10-CM | POA: Diagnosis present

## 2018-10-14 DIAGNOSIS — G92 Toxic encephalopathy: Secondary | ICD-10-CM | POA: Diagnosis present

## 2018-10-14 DIAGNOSIS — G9340 Encephalopathy, unspecified: Secondary | ICD-10-CM

## 2018-10-14 DIAGNOSIS — A419 Sepsis, unspecified organism: Secondary | ICD-10-CM | POA: Diagnosis not present

## 2018-10-14 DIAGNOSIS — E870 Hyperosmolality and hypernatremia: Secondary | ICD-10-CM | POA: Diagnosis present

## 2018-10-14 DIAGNOSIS — R6520 Severe sepsis without septic shock: Secondary | ICD-10-CM

## 2018-10-14 DIAGNOSIS — R498 Other voice and resonance disorders: Secondary | ICD-10-CM | POA: Diagnosis not present

## 2018-10-14 DIAGNOSIS — N183 Chronic kidney disease, stage 3 unspecified: Secondary | ICD-10-CM | POA: Diagnosis present

## 2018-10-14 DIAGNOSIS — L98499 Non-pressure chronic ulcer of skin of other sites with unspecified severity: Secondary | ICD-10-CM | POA: Diagnosis present

## 2018-10-14 DIAGNOSIS — G308 Other Alzheimer's disease: Secondary | ICD-10-CM | POA: Diagnosis not present

## 2018-10-14 DIAGNOSIS — E1151 Type 2 diabetes mellitus with diabetic peripheral angiopathy without gangrene: Secondary | ICD-10-CM | POA: Diagnosis present

## 2018-10-14 DIAGNOSIS — Z7982 Long term (current) use of aspirin: Secondary | ICD-10-CM

## 2018-10-14 DIAGNOSIS — J449 Chronic obstructive pulmonary disease, unspecified: Secondary | ICD-10-CM | POA: Diagnosis present

## 2018-10-14 DIAGNOSIS — I7389 Other specified peripheral vascular diseases: Secondary | ICD-10-CM | POA: Diagnosis present

## 2018-10-14 DIAGNOSIS — Z7951 Long term (current) use of inhaled steroids: Secondary | ICD-10-CM

## 2018-10-14 DIAGNOSIS — E1165 Type 2 diabetes mellitus with hyperglycemia: Secondary | ICD-10-CM

## 2018-10-14 DIAGNOSIS — E1169 Type 2 diabetes mellitus with other specified complication: Secondary | ICD-10-CM | POA: Diagnosis present

## 2018-10-14 DIAGNOSIS — G4733 Obstructive sleep apnea (adult) (pediatric): Secondary | ICD-10-CM | POA: Diagnosis present

## 2018-10-14 DIAGNOSIS — J9620 Acute and chronic respiratory failure, unspecified whether with hypoxia or hypercapnia: Secondary | ICD-10-CM | POA: Diagnosis present

## 2018-10-14 DIAGNOSIS — R2689 Other abnormalities of gait and mobility: Secondary | ICD-10-CM | POA: Diagnosis present

## 2018-10-14 DIAGNOSIS — Z6841 Body Mass Index (BMI) 40.0 and over, adult: Secondary | ICD-10-CM | POA: Diagnosis not present

## 2018-10-14 DIAGNOSIS — E861 Hypovolemia: Secondary | ICD-10-CM | POA: Diagnosis present

## 2018-10-14 DIAGNOSIS — R000 Tachycardia, unspecified: Secondary | ICD-10-CM | POA: Diagnosis not present

## 2018-10-14 DIAGNOSIS — E1122 Type 2 diabetes mellitus with diabetic chronic kidney disease: Secondary | ICD-10-CM | POA: Diagnosis present

## 2018-10-14 DIAGNOSIS — G894 Chronic pain syndrome: Secondary | ICD-10-CM

## 2018-10-14 DIAGNOSIS — L97929 Non-pressure chronic ulcer of unspecified part of left lower leg with unspecified severity: Secondary | ICD-10-CM

## 2018-10-14 DIAGNOSIS — J9621 Acute and chronic respiratory failure with hypoxia: Secondary | ICD-10-CM | POA: Diagnosis present

## 2018-10-14 DIAGNOSIS — Z794 Long term (current) use of insulin: Secondary | ICD-10-CM

## 2018-10-14 DIAGNOSIS — M2550 Pain in unspecified joint: Secondary | ICD-10-CM | POA: Diagnosis not present

## 2018-10-14 DIAGNOSIS — E11622 Type 2 diabetes mellitus with other skin ulcer: Secondary | ICD-10-CM | POA: Diagnosis present

## 2018-10-14 DIAGNOSIS — R4781 Slurred speech: Secondary | ICD-10-CM | POA: Diagnosis not present

## 2018-10-14 DIAGNOSIS — I130 Hypertensive heart and chronic kidney disease with heart failure and stage 1 through stage 4 chronic kidney disease, or unspecified chronic kidney disease: Secondary | ICD-10-CM | POA: Diagnosis present

## 2018-10-14 DIAGNOSIS — G8929 Other chronic pain: Secondary | ICD-10-CM | POA: Diagnosis present

## 2018-10-14 DIAGNOSIS — R0602 Shortness of breath: Secondary | ICD-10-CM | POA: Diagnosis not present

## 2018-10-14 DIAGNOSIS — M6281 Muscle weakness (generalized): Secondary | ICD-10-CM | POA: Diagnosis present

## 2018-10-14 DIAGNOSIS — IMO0002 Reserved for concepts with insufficient information to code with codable children: Secondary | ICD-10-CM | POA: Diagnosis present

## 2018-10-14 DIAGNOSIS — Z9981 Dependence on supplemental oxygen: Secondary | ICD-10-CM

## 2018-10-14 DIAGNOSIS — N179 Acute kidney failure, unspecified: Secondary | ICD-10-CM | POA: Diagnosis present

## 2018-10-14 DIAGNOSIS — J9601 Acute respiratory failure with hypoxia: Secondary | ICD-10-CM | POA: Diagnosis present

## 2018-10-14 DIAGNOSIS — E662 Morbid (severe) obesity with alveolar hypoventilation: Secondary | ICD-10-CM | POA: Diagnosis present

## 2018-10-14 DIAGNOSIS — E785 Hyperlipidemia, unspecified: Secondary | ICD-10-CM | POA: Diagnosis present

## 2018-10-14 DIAGNOSIS — I878 Other specified disorders of veins: Secondary | ICD-10-CM | POA: Diagnosis present

## 2018-10-14 DIAGNOSIS — L89623 Pressure ulcer of left heel, stage 3: Secondary | ICD-10-CM | POA: Diagnosis not present

## 2018-10-14 DIAGNOSIS — L03116 Cellulitis of left lower limb: Secondary | ICD-10-CM | POA: Diagnosis present

## 2018-10-14 DIAGNOSIS — Z79899 Other long term (current) drug therapy: Secondary | ICD-10-CM

## 2018-10-14 DIAGNOSIS — I83029 Varicose veins of left lower extremity with ulcer of unspecified site: Secondary | ICD-10-CM | POA: Diagnosis present

## 2018-10-14 DIAGNOSIS — A4189 Other specified sepsis: Secondary | ICD-10-CM | POA: Diagnosis not present

## 2018-10-14 DIAGNOSIS — R293 Abnormal posture: Secondary | ICD-10-CM | POA: Diagnosis present

## 2018-10-14 DIAGNOSIS — E1140 Type 2 diabetes mellitus with diabetic neuropathy, unspecified: Secondary | ICD-10-CM | POA: Diagnosis present

## 2018-10-14 DIAGNOSIS — Z993 Dependence on wheelchair: Secondary | ICD-10-CM

## 2018-10-14 DIAGNOSIS — Z888 Allergy status to other drugs, medicaments and biological substances status: Secondary | ICD-10-CM

## 2018-10-14 DIAGNOSIS — R600 Localized edema: Secondary | ICD-10-CM | POA: Diagnosis not present

## 2018-10-14 DIAGNOSIS — Z7401 Bed confinement status: Secondary | ICD-10-CM | POA: Diagnosis not present

## 2018-10-14 DIAGNOSIS — Z89511 Acquired absence of right leg below knee: Secondary | ICD-10-CM

## 2018-10-14 DIAGNOSIS — J9602 Acute respiratory failure with hypercapnia: Secondary | ICD-10-CM | POA: Diagnosis not present

## 2018-10-14 DIAGNOSIS — G9341 Metabolic encephalopathy: Secondary | ICD-10-CM | POA: Diagnosis present

## 2018-10-14 DIAGNOSIS — E119 Type 2 diabetes mellitus without complications: Secondary | ICD-10-CM | POA: Diagnosis present

## 2018-10-14 DIAGNOSIS — E1149 Type 2 diabetes mellitus with other diabetic neurological complication: Secondary | ICD-10-CM | POA: Diagnosis present

## 2018-10-14 LAB — CBC WITH DIFFERENTIAL/PLATELET
Abs Immature Granulocytes: 0.35 10*3/uL — ABNORMAL HIGH (ref 0.00–0.07)
BASOS PCT: 0 %
Basophils Absolute: 0.1 10*3/uL (ref 0.0–0.1)
EOS ABS: 0 10*3/uL (ref 0.0–0.5)
Eosinophils Relative: 0 %
HCT: 37 % (ref 36.0–46.0)
Hemoglobin: 10.8 g/dL — ABNORMAL LOW (ref 12.0–15.0)
Immature Granulocytes: 2 %
Lymphocytes Relative: 3 %
Lymphs Abs: 0.7 10*3/uL (ref 0.7–4.0)
MCH: 28.1 pg (ref 26.0–34.0)
MCHC: 29.2 g/dL — AB (ref 30.0–36.0)
MCV: 96.4 fL (ref 80.0–100.0)
MONOS PCT: 2 %
Monocytes Absolute: 0.4 10*3/uL (ref 0.1–1.0)
NEUTROS PCT: 93 %
NRBC: 0 % (ref 0.0–0.2)
Neutro Abs: 22.5 10*3/uL — ABNORMAL HIGH (ref 1.7–7.7)
PLATELETS: 211 10*3/uL (ref 150–400)
RBC: 3.84 MIL/uL — AB (ref 3.87–5.11)
RDW: 16.7 % — AB (ref 11.5–15.5)
WBC: 24 10*3/uL — AB (ref 4.0–10.5)

## 2018-10-14 LAB — BASIC METABOLIC PANEL
ANION GAP: 9 (ref 5–15)
BUN: 49 mg/dL — ABNORMAL HIGH (ref 8–23)
CO2: 29 mmol/L (ref 22–32)
Calcium: 8.1 mg/dL — ABNORMAL LOW (ref 8.9–10.3)
Chloride: 109 mmol/L (ref 98–111)
Creatinine, Ser: 1.92 mg/dL — ABNORMAL HIGH (ref 0.44–1.00)
GFR calc Af Amer: 29 mL/min — ABNORMAL LOW (ref >60)
GFR calc non Af Amer: 25 mL/min — ABNORMAL LOW (ref >60)
Glucose, Bld: 188 mg/dL — ABNORMAL HIGH (ref 70–99)
Potassium: 3.9 mmol/L (ref 3.5–5.1)
Sodium: 147 mmol/L — ABNORMAL HIGH (ref 135–145)

## 2018-10-14 LAB — COMPREHENSIVE METABOLIC PANEL
ALBUMIN: 2.7 g/dL — AB (ref 3.5–5.0)
ALT: 18 U/L (ref 0–44)
ANION GAP: 11 (ref 5–15)
AST: 27 U/L (ref 15–41)
Alkaline Phosphatase: 60 U/L (ref 38–126)
BILIRUBIN TOTAL: 0.9 mg/dL (ref 0.3–1.2)
BUN: 56 mg/dL — ABNORMAL HIGH (ref 8–23)
CO2: 32 mmol/L (ref 22–32)
Calcium: 8.6 mg/dL — ABNORMAL LOW (ref 8.9–10.3)
Chloride: 104 mmol/L (ref 98–111)
Creatinine, Ser: 2.09 mg/dL — ABNORMAL HIGH (ref 0.44–1.00)
GFR calc Af Amer: 27 mL/min — ABNORMAL LOW (ref >60)
GFR, EST NON AFRICAN AMERICAN: 23 mL/min — AB (ref >60)
GLUCOSE: 245 mg/dL — AB (ref 70–99)
POTASSIUM: 3.8 mmol/L (ref 3.5–5.1)
Sodium: 147 mmol/L — ABNORMAL HIGH (ref 135–145)
TOTAL PROTEIN: 7.7 g/dL (ref 6.5–8.1)

## 2018-10-14 LAB — AMMONIA: AMMONIA: 19 umol/L (ref 9–35)

## 2018-10-14 LAB — BLOOD GAS, ARTERIAL
Acid-Base Excess: 4.9 mmol/L — ABNORMAL HIGH (ref 0.0–2.0)
Bicarbonate: 30.1 mmol/L — ABNORMAL HIGH (ref 20.0–28.0)
Delivery systems: POSITIVE
Drawn by: 331471
Expiratory PAP: 6
FIO2: 21
Inspiratory PAP: 16
O2 SAT: 83.6 %
PO2 ART: 46.9 mmHg — AB (ref 83.0–108.0)
Patient temperature: 98.6
pCO2 arterial: 50 mmHg — ABNORMAL HIGH (ref 32.0–48.0)
pH, Arterial: 7.397 (ref 7.350–7.450)

## 2018-10-14 LAB — I-STAT CG4 LACTIC ACID, ED
LACTIC ACID, VENOUS: 1.49 mmol/L (ref 0.5–1.9)
Lactic Acid, Venous: 3.88 mmol/L (ref 0.5–1.9)

## 2018-10-14 LAB — TSH: TSH: 0.692 u[IU]/mL (ref 0.350–4.500)

## 2018-10-14 LAB — GLUCOSE, CAPILLARY: Glucose-Capillary: 186 mg/dL — ABNORMAL HIGH (ref 70–99)

## 2018-10-14 LAB — TROPONIN I
TROPONIN I: 0.03 ng/mL — AB (ref <0.03)
Troponin I: 0.04 ng/mL (ref <0.03)

## 2018-10-14 LAB — LACTIC ACID, PLASMA: Lactic Acid, Venous: 2.2 mmol/L (ref 0.5–1.9)

## 2018-10-14 MED ORDER — ALBUTEROL SULFATE (2.5 MG/3ML) 0.083% IN NEBU
INHALATION_SOLUTION | RESPIRATORY_TRACT | Status: AC
  Filled 2018-10-14: qty 3

## 2018-10-14 MED ORDER — ASPIRIN EC 81 MG PO TBEC
81.0000 mg | DELAYED_RELEASE_TABLET | Freq: Every day | ORAL | Status: DC
  Administered 2018-10-15 – 2018-10-18 (×4): 81 mg via ORAL
  Filled 2018-10-14 (×4): qty 1

## 2018-10-14 MED ORDER — SODIUM CHLORIDE 0.9 % IV BOLUS
1000.0000 mL | Freq: Once | INTRAVENOUS | Status: AC
  Administered 2018-10-14: 1000 mL via INTRAVENOUS

## 2018-10-14 MED ORDER — ESCITALOPRAM OXALATE 20 MG PO TABS
10.0000 mg | ORAL_TABLET | Freq: Every day | ORAL | Status: DC
  Administered 2018-10-15 – 2018-10-18 (×4): 10 mg via ORAL
  Filled 2018-10-14 (×4): qty 1

## 2018-10-14 MED ORDER — SENNOSIDES-DOCUSATE SODIUM 8.6-50 MG PO TABS: 1.0000 | ORAL_TABLET | Freq: Every evening | ORAL | Status: DC | PRN

## 2018-10-14 MED ORDER — MOMETASONE FURO-FORMOTEROL FUM 200-5 MCG/ACT IN AERO
2.0000 | INHALATION_SPRAY | Freq: Two times a day (BID) | RESPIRATORY_TRACT | Status: DC
  Administered 2018-10-15 – 2018-10-18 (×7): 2 via RESPIRATORY_TRACT
  Filled 2018-10-14: qty 8.8

## 2018-10-14 MED ORDER — SODIUM CHLORIDE 0.9 % IV SOLN
2.0000 g | INTRAVENOUS | Status: AC
  Administered 2018-10-14: 2 g via INTRAVENOUS
  Filled 2018-10-14: qty 2

## 2018-10-14 MED ORDER — ACETAMINOPHEN 650 MG RE SUPP
650.0000 mg | Freq: Four times a day (QID) | RECTAL | Status: DC | PRN
  Administered 2018-10-15: 650 mg via RECTAL
  Filled 2018-10-14: qty 1

## 2018-10-14 MED ORDER — VANCOMYCIN HCL 10 G IV SOLR
2000.0000 mg | INTRAVENOUS | Status: AC
  Administered 2018-10-14: 2000 mg via INTRAVENOUS
  Filled 2018-10-14: qty 2000

## 2018-10-14 MED ORDER — OSELTAMIVIR PHOSPHATE 6 MG/ML PO SUSR
30.0000 mg | Freq: Two times a day (BID) | ORAL | Status: DC
  Administered 2018-10-15: 30 mg via ORAL
  Filled 2018-10-14 (×2): qty 12.5

## 2018-10-14 MED ORDER — TRAMADOL HCL 50 MG PO TABS
50.0000 mg | ORAL_TABLET | Freq: Four times a day (QID) | ORAL | Status: DC | PRN
  Administered 2018-10-15: 50 mg via ORAL
  Filled 2018-10-14: qty 1

## 2018-10-14 MED ORDER — ALBUTEROL SULFATE (2.5 MG/3ML) 0.083% IN NEBU: 2.5000 mg | INHALATION_SOLUTION | RESPIRATORY_TRACT | Status: DC | PRN

## 2018-10-14 MED ORDER — ACETAMINOPHEN 325 MG PO TABS
650.0000 mg | ORAL_TABLET | Freq: Four times a day (QID) | ORAL | Status: DC | PRN
  Administered 2018-10-16 – 2018-10-18 (×2): 650 mg via ORAL
  Filled 2018-10-14 (×2): qty 2

## 2018-10-14 MED ORDER — IPRATROPIUM-ALBUTEROL 0.5-2.5 (3) MG/3ML IN SOLN
3.0000 mL | Freq: Four times a day (QID) | RESPIRATORY_TRACT | Status: DC
  Administered 2018-10-15 – 2018-10-18 (×14): 3 mL via RESPIRATORY_TRACT
  Filled 2018-10-14 (×15): qty 3

## 2018-10-14 MED ORDER — SODIUM CHLORIDE 0.45 % IV SOLN: INTRAVENOUS | Status: DC

## 2018-10-14 MED ORDER — METRONIDAZOLE IN NACL 5-0.79 MG/ML-% IV SOLN
500.0000 mg | Freq: Three times a day (TID) | INTRAVENOUS | Status: DC
  Administered 2018-10-14 – 2018-10-17 (×8): 500 mg via INTRAVENOUS
  Filled 2018-10-14 (×8): qty 100

## 2018-10-14 MED ORDER — SODIUM CHLORIDE 0.9 % IV SOLN
1.0000 g | Freq: Two times a day (BID) | INTRAVENOUS | Status: DC
  Administered 2018-10-15 – 2018-10-16 (×4): 1 g via INTRAVENOUS
  Filled 2018-10-14 (×5): qty 1

## 2018-10-14 MED ORDER — INSULIN GLARGINE 100 UNIT/ML ~~LOC~~ SOLN
15.0000 [IU] | Freq: Every day | SUBCUTANEOUS | Status: DC
  Administered 2018-10-14 – 2018-10-17 (×4): 15 [IU] via SUBCUTANEOUS
  Filled 2018-10-14 (×4): qty 0.15

## 2018-10-14 MED ORDER — ENOXAPARIN SODIUM 30 MG/0.3ML ~~LOC~~ SOLN
30.0000 mg | SUBCUTANEOUS | Status: DC
  Administered 2018-10-14 – 2018-10-17 (×4): 30 mg via SUBCUTANEOUS
  Filled 2018-10-14 (×4): qty 0.3

## 2018-10-14 MED ORDER — ALBUTEROL SULFATE (2.5 MG/3ML) 0.083% IN NEBU
2.5000 mg | INHALATION_SOLUTION | Freq: Four times a day (QID) | RESPIRATORY_TRACT | Status: DC
  Administered 2018-10-14: 2.5 mg via RESPIRATORY_TRACT

## 2018-10-14 MED ORDER — ONDANSETRON HCL 4 MG/2ML IJ SOLN: 4.0000 mg | Freq: Four times a day (QID) | INTRAMUSCULAR | Status: DC | PRN

## 2018-10-14 MED ORDER — VANCOMYCIN HCL 10 G IV SOLR
1500.0000 mg | INTRAVENOUS | Status: DC
  Filled 2018-10-14: qty 1500

## 2018-10-14 MED ORDER — INSULIN ASPART 100 UNIT/ML ~~LOC~~ SOLN
0.0000 [IU] | Freq: Three times a day (TID) | SUBCUTANEOUS | Status: DC
  Administered 2018-10-15: 2 [IU] via SUBCUTANEOUS
  Administered 2018-10-15 – 2018-10-16 (×3): 3 [IU] via SUBCUTANEOUS
  Administered 2018-10-16 (×2): 5 [IU] via SUBCUTANEOUS
  Administered 2018-10-17: 3 [IU] via SUBCUTANEOUS
  Administered 2018-10-17: 5 [IU] via SUBCUTANEOUS
  Administered 2018-10-17: 3 [IU] via SUBCUTANEOUS
  Administered 2018-10-18: 2 [IU] via SUBCUTANEOUS

## 2018-10-14 MED ORDER — SODIUM CHLORIDE 0.9 % IV SOLN: Freq: Once | INTRAVENOUS | Status: DC

## 2018-10-14 MED ORDER — OSELTAMIVIR PHOSPHATE 75 MG PO CAPS: 75.0000 mg | ORAL_CAPSULE | Freq: Two times a day (BID) | ORAL | Status: DC

## 2018-10-14 MED ORDER — INSULIN ASPART 100 UNIT/ML ~~LOC~~ SOLN
0.0000 [IU] | Freq: Every day | SUBCUTANEOUS | Status: DC
  Administered 2018-10-16: 3 [IU] via SUBCUTANEOUS

## 2018-10-14 MED ORDER — METRONIDAZOLE IN NACL 5-0.79 MG/ML-% IV SOLN
500.0000 mg | Freq: Once | INTRAVENOUS | Status: AC
  Administered 2018-10-14: 500 mg via INTRAVENOUS
  Filled 2018-10-14: qty 100

## 2018-10-14 MED ORDER — ONDANSETRON HCL 4 MG PO TABS: 4.0000 mg | ORAL_TABLET | Freq: Four times a day (QID) | ORAL | Status: DC | PRN

## 2018-10-14 NOTE — Progress Notes (Signed)
A consult was received from an ED physician for Cefepime and Vancomycin per pharmacy dosing.  The patient's profile has been reviewed for ht/wt/allergies/indication/available labs.   A one time order has been placed for Cefepime 2g IV x1, and Vancomycin 2g.  Further antibiotics/pharmacy consults should be ordered by admitting physician if indicated.                       Thank you,  Gretta Arab PharmD, BCPS Pager [phone] 10/14/2018 2:24 PM

## 2018-10-14 NOTE — ED Notes (Addendum)
ED TO INPATIENT HANDOFF REPORT  Name/Age/Gender Heather Zimmerman. [name] 73 y.o. female  Code Status Code Status History    Date Active Date Inactive Code Status Order ID Comments User Context   03/31/2018 1603 04/12/2018 1425 Full Code [id number]  Germain Osgood, PA-C ED   05/29/2017 0239 05/31/2017 1806 Full Code [id number]  Reubin Milan, MD Inpatient   04/29/2017 0216 05/03/2017 1723 Full Code [id number]  Norval Morton, MD ED   12/28/2016 2321 01/03/2017 1940 Full Code [id number]  Nicolette Bang, DO Inpatient   11/12/2016 0604 11/19/2016 2216 Full Code [id number]  Rise Patience, MD ED   09/23/2016 2119 09/26/2016 2259 Full Code [id number]  Ivor Costa, MD ED   07/20/2016 0307 08/01/2016 2326 Full Code [id number]  Luz Brazen, MD ED   12/19/2015 1634 12/26/2015 0226 Full Code [id number]  Theodis Blaze, MD Inpatient   12/19/2015 1548 12/19/2015 1634 Full Code [id number]  Raylene Miyamoto, MD ED   10/03/2015 0614 10/10/2015 1735 Full Code [id number]  Rise Patience, MD Inpatient   07/18/2015 0023 07/21/2015 1913 Full Code [id number]  Waldemar Dickens, MD Inpatient   06/21/2014 0032 06/30/2014 2026 Full Code [id number]  Germain Osgood, PA-C ED    Advance Directive Documentation     Most Recent Value  Type of Advance Directive  Living will  Pre-existing out of facility DNR order (yellow form or pink MOST form)  -  "MOST" Form in Place?  -      Home/SNF/Other SNF   Chief Complaint generalized weakness; fever  Level of Care/Admitting Diagnosis ED Disposition    ED Disposition Condition [place]: [facility] [[id number]]  Level of Care: Stepdown [14]  Admit to SDU based on following criteria: Hemodynamic compromise or significant risk of instability:  Patient requiring short term acute titration and management of vasoactive drips, and invasive monitoring (i.e., CVP and Arterial line).  Admit to SDU based on following criteria: Respiratory  Distress:  Frequent assessment and/or intervention to maintain adequate ventilation/respiration, pulmonary toilet, and respiratory treatment.  Diagnosis: Sepsis [facility]) [[id number]]  Admitting Physician: Edwin Dada [[id number]]  Attending Physician: Edwin Dada [[id number]]  Estimated length of stay: past midnight tomorrow  Certification:: I certify this patient will need inpatient services for at least 2 midnights  PT Class (Do Not Modify): Inpatient [101]  PT Acc Code (Do Not Modify): Private [1]       Medical History Past Medical History:  Diagnosis Date  . Acute respiratory failure ([place]) 03/2018  . Chronic diastolic heart failure ([place]) 12/31/2012  . Chronic pain 12/31/2012  . CKD (chronic kidney disease), stage III ([place])   . Diabetes mellitus without complication ([place])    TYPE 2  . Essential hypertension, benign 12/31/2012  . GERD 12/31/2012  . Obstructive sleep apnea 07/05/2014  . Shortness of breath dyspnea   . 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 Unspecified vitamin D deficiency 12/31/2012  . Venous insufficiency (chronic) (peripheral)   . Venous stasis ulcers (HCC)     Allergies Allergies  Allergen Reactions  . Ace Inhibitors Other (See Comments)    unknown    IV Location/Drains/Wounds Patient Lines/Drains/Airways Status   Active Line/Drains/Airways    Name:   Placement date:   Placement time:   Site:   Days:   Peripheral IV 08/31/18 Posterior;Right Forearm   08/31/18    1513  Forearm   44   Peripheral IV 10/14/18 Left Antecubital   10/14/18    1116    Antecubital   less than 1   External Urinary Catheter   04/11/18    1205    -   186   Wound / Incision (Open or Dehisced) 11/12/16 Other (Comment) Leg Left;Lateral red, moist   11/12/16    1500    Leg   701   Wound / Incision (Open or Dehisced) 12/28/16 Venous stasis ulcer Leg Left;Lower various stages of many  chronic ulcer healing    12/28/16    2300    Leg   655   Wound / Incision (Open or Dehisced) 04/29/17 Venous stasis ulcer Leg Medial   04/29/17    1106    Leg   533   Wound / Incision (Open or Dehisced) 04/29/17 Venous stasis ulcer Leg Left;Medial   04/29/17    1106    Leg   533   Wound / Incision (Open or Dehisced) 04/29/17 Venous stasis ulcer Leg Left;Posterior   04/29/17    1107    Leg   533   Wound / Incision (Open or Dehisced) 04/22/17 Venous stasis ulcer Leg Left;Posterior   04/22/17    1107    Leg   540          Labs/Imaging Results for orders placed or performed during the hospital encounter of 10/14/18 (from the past 48 hour(s))  CBC with Differential     Status: Abnormal   Collection Time: 10/14/18 11:12 AM  Result Value Ref Range   WBC 24.0 (H) 4.0 - 10.5 K/uL   RBC 3.84 (L) 3.87 - 5.11 MIL/uL   Hemoglobin 10.8 (L) 12.0 - 15.0 g/dL   HCT 37.0 36.0 - 46.0 %   MCV 96.4 80.0 - 100.0 fL   MCH 28.1 26.0 - 34.0 pg   MCHC 29.2 (L) 30.0 - 36.0 g/dL   RDW 16.7 (H) 11.5 - 15.5 %   Platelets 211 150 - 400 K/uL   nRBC 0.0 0.0 - 0.2 %   Neutrophils Relative % 93 %   Neutro Abs 22.5 (H) 1.7 - 7.7 K/uL   Lymphocytes Relative 3 %   Lymphs Abs 0.7 0.7 - 4.0 K/uL   Monocytes Relative 2 %   Monocytes Absolute 0.4 0.1 - 1.0 K/uL   Eosinophils Relative 0 %   Eosinophils Absolute 0.0 0.0 - 0.5 K/uL   Basophils Relative 0 %   Basophils Absolute 0.1 0.0 - 0.1 K/uL   WBC Morphology DOHLE BODIES     Comment: MILD LEFT SHIFT (1-5% METAS, OCC MYELO, OCC BANDS)   Immature Granulocytes 2 %   Abs Immature Granulocytes 0.35 (H) 0.00 - 0.07 K/uL    Comment: Performed at [facility], [place] [address], [place], [place] [address]  Comprehensive metabolic panel     Status: Abnormal   Collection Time: 10/14/18 11:12 AM  Result Value Ref Range   Sodium 147 (H) 135 - 145 mmol/L   Potassium 3.8 3.5 - 5.1 mmol/L   Chloride 104 98 - 111 mmol/L   CO2 32 22 - 32 mmol/L   Glucose, Bld  245 (H) 70 - 99 mg/dL   BUN 56 (H) 8 - 23 mg/dL   Creatinine, Ser 2.09 (H) 0.44 - 1.00 mg/dL   Calcium 8.6 (L) 8.9 - 10.3 mg/dL   Total Protein 7.7 6.5 - 8.1 g/dL   Albumin 2.7 (L) 3.5 - 5.0  g/dL   AST 27 15 - 41 U/L   ALT 18 0 - 44 U/L   Alkaline Phosphatase 60 38 - 126 U/L   Total Bilirubin 0.9 0.3 - 1.2 mg/dL   GFR calc non Af Amer 23 (L) >60 mL/min   GFR calc Af Amer 27 (L) >60 mL/min   Anion gap 11 5 - 15    Comment: Performed at [facility], [place] [address], [place], [place] [address]  Troponin I - Once     Status: Abnormal   Collection Time: 10/14/18 11:12 AM  Result Value Ref Range   Troponin I 0.03 (HH) <0.03 ng/mL    Comment: CRITICAL RESULT CALLED TO, READ BACK BY AND VERIFIED WITH: SMITH,T. RN 1213 10/14/18 MULLINS,T Performed at [facility], [place] [address], [place], [place] [address]   I-Stat CG4 Lactic Acid, ED     Status: Abnormal   Collection Time: 10/14/18  1:00 PM  Result Value Ref Range   Lactic Acid, Venous 3.88 (HH) 0.5 - 1.9 mmol/L   Comment NOTIFIED PHYSICIAN    Dg Chest Port 1 View  Result Date: 10/14/2018 CLINICAL DATA:  73 year old female with history of weakness and shortness of breath. Confusion. EXAM: PORTABLE CHEST 1 VIEW COMPARISON:  Chest x-ray 04/07/2018. FINDINGS: Lung volumes are low. No consolidative airspace disease. No pleural effusions. No pneumothorax. No pulmonary nodule or mass noted. Pulmonary vasculature is within normal limits. Heart size is mildly enlarged. Upper mediastinal contours are within normal limits. Aortic atherosclerosis. IMPRESSION: 1. Low lung volumes without radiographic evidence of acute cardiopulmonary disease. 2. Aortic atherosclerosis. 3. Mild cardiomegaly. Electronically Signed   By: Vinnie Langton M.D.   On: 10/14/2018 11:35   None  Pending Labs Unresulted Labs (From admission, onward)    Start     Ordered   10/14/18 1554  Influenza panel by PCR (type A & B)  (Influenza PCR  Panel)  Once,   R     10/14/18 1553   10/14/18 1050  Blood culture (routine x 2)  BLOOD CULTURE X 2,   STAT     10/14/18 1049   10/14/18 1050  Urinalysis, Routine w reflex microscopic  ONCE - STAT,   STAT     10/14/18 1049          Vitals/Pain Today's Vitals   10/14/18 1300 10/14/18 1308 10/14/18 1400 10/14/18 1500  BP: (!) 135/57 (!) 135/57 123/65 124/77  Pulse: 98 99 (!) 102 100  Resp: (!) 28 (!) 27 (!) 35 (!) 30  Temp:      TempSrc:      SpO2: 100% 100% 100% 100%  Weight:      Height:      PainSc:        Isolation Precautions Droplet precaution  Medications Medications  vancomycin (VANCOCIN) 2,000 mg in sodium chloride 0.9 % 500 mL IVPB (2,000 mg Intravenous New Bag/Given 10/14/18 1504)  oseltamivir (TAMIFLU) 6 MG/ML suspension 30 mg (has no administration in time range)  sodium chloride 0.9 % bolus 1,000 mL (1,000 mLs Intravenous New Bag/Given 10/14/18 1119)  metroNIDAZOLE (FLAGYL) IVPB 500 mg (500 mg Intravenous New Bag/Given 10/14/18 1504)  sodium chloride 0.9 % bolus 1,000 mL (1,000 mLs Intravenous New Bag/Given 10/14/18 1501)  ceFEPIme (MAXIPIME) 2 g in sodium chloride 0.9 % 100 mL IVPB (2 g Intravenous New Bag/Given 10/14/18 1503)    Mobility non-ambulatory

## 2018-10-14 NOTE — ED Notes (Signed)
Bed: JS97 Expected date:  Expected time:  Means of arrival:  Comments: EMS/Fever/hyperglycemia
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
## 2018-10-14 NOTE — ED Notes (Signed)
Pure wick in place to obtain urine specimen  

## 2018-10-14 NOTE — Progress Notes (Signed)
Pharmacy Antibiotic Note  Heather Zimmerman is a 73 y.o. female admitted on 10/14/2018 with sepsis, unknown source.  Pharmacy has been consulted for Vancomycin and Cefepime dosing.  Metronidazole and Tamiflu per MD. - CXR:  no acute cardiopulmonary disease. - LA 3.88 >> 1.49 - WBC 24, Tm 102.6 - SCr 2.09, acutely elevated (previous baseline ~1.4 in 03/2018)   Plan:  Metronidazole 500mg  IV q8h   Tamiflu 30 mg PO BID  Cefepime 1g IV q12h  Vancomycin 2g IV x1 dose in ED, then 1500 mg IV q48h.  (SCr 2.09, est AUC 482)  Check vancomycin levels if remains on vancomycin > 3-4 days.  Goal AUC 400-500.  Follow up renal fxn, culture results, and clinical course.  F/u ability to de-escalate antibiotics.   Height: 5' 3.5" (161.3 cm) Weight: 267 lb (121.1 kg) IBW/kg (Calculated) : 53.55  Temp (24hrs), Avg:102.6 F (39.2 C), Min:102.6 F (39.2 C), Max:102.6 F (39.2 C)  Recent Labs  Lab 10/14/18 1112 10/14/18 1300 10/14/18 1724  WBC 24.0*  --   --   CREATININE 2.09*  --   --   LATICACIDVEN  --  3.88* 1.49    Estimated Creatinine Clearance: 30.5 mL/min (A) (by C-G formula based on SCr of 2.09 mg/dL (H)).    Allergies  Allergen Reactions  . Ace Inhibitors Other (See Comments)    unknown    Antimicrobials this admission: 1/1 Cefepime >> 1/1 Vancomycin >>  1/1 Metronidazole >> 1/1 Tamiflu >>   Dose adjustments this admission:   Microbiology results: 1/1 BCx:   Thank you for allowing pharmacy to be a part of this patient's care.  Gretta Arab PharmD, BCPS Pager [phone] 10/14/2018 6:03 PM

## 2018-10-14 NOTE — ED Triage Notes (Signed)
Pt BIB [facility] from [place] at [facility] for fever, generalized soreness, and temp of 102 at facility. Pt given tylenol 1000mg  at 0730. Hx DM, CBG 329. Wears 2L at all times. Hx of acute respiratory failure, CKD, and HTN

## 2018-10-14 NOTE — Progress Notes (Signed)
Pt placed on BIPAP in ED per MD order. ABG done on 16/6 FIO2 21% per MD. Rt placed pt on 16/6 FIO2 30% after ABG done.  Pt being transferred to ICU step down.

## 2018-10-14 NOTE — ED Notes (Signed)
Called to give report per [facility]. States that they are at capacity and are not able to take pt at this time, and that she would make bed placement aware .
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
## 2018-10-14 NOTE — ED Notes (Signed)
Respiratory called to assist with transport because patient is on BiPAP.

## 2018-10-15 ENCOUNTER — Other Ambulatory Visit: Payer: Self-pay

## 2018-10-15 ENCOUNTER — Inpatient Hospital Stay (HOSPITAL_COMMUNITY): Payer: Medicare Other

## 2018-10-15 LAB — INFLUENZA PANEL BY PCR (TYPE A & B)
Influenza A By PCR: NEGATIVE
Influenza B By PCR: NEGATIVE

## 2018-10-15 LAB — BASIC METABOLIC PANEL
Anion gap: 11 (ref 5–15)
BUN: 49 mg/dL — ABNORMAL HIGH (ref 8–23)
CO2: 28 mmol/L (ref 22–32)
Calcium: 8.2 mg/dL — ABNORMAL LOW (ref 8.9–10.3)
Chloride: 111 mmol/L (ref 98–111)
Creatinine, Ser: 2 mg/dL — ABNORMAL HIGH (ref 0.44–1.00)
GFR calc Af Amer: 28 mL/min — ABNORMAL LOW (ref >60)
GFR calc non Af Amer: 24 mL/min — ABNORMAL LOW (ref >60)
Glucose, Bld: 173 mg/dL — ABNORMAL HIGH (ref 70–99)
Potassium: 3.8 mmol/L (ref 3.5–5.1)
Sodium: 150 mmol/L — ABNORMAL HIGH (ref 135–145)

## 2018-10-15 LAB — GLUCOSE, CAPILLARY
Glucose-Capillary: 192 mg/dL — ABNORMAL HIGH (ref 70–99)
Glucose-Capillary: 172 mg/dL — ABNORMAL HIGH (ref 70–99)
Glucose-Capillary: 138 mg/dL — ABNORMAL HIGH (ref 70–99)
Glucose-Capillary: 180 mg/dL — ABNORMAL HIGH (ref 70–99)

## 2018-10-15 LAB — RESPIRATORY PANEL BY PCR
ADENOVIRUS-RVPPCR: NOT DETECTED
Bordetella pertussis: NOT DETECTED
CHLAMYDOPHILA PNEUMONIAE-RVPPCR: NOT DETECTED
CORONAVIRUS NL63-RVPPCR: NOT DETECTED
Coronavirus 229E: NOT DETECTED
Coronavirus HKU1: NOT DETECTED
Coronavirus OC43: NOT DETECTED
Influenza A: NOT DETECTED
Influenza B: NOT DETECTED
Metapneumovirus: NOT DETECTED
Mycoplasma pneumoniae: NOT DETECTED
Parainfluenza Virus 1: NOT DETECTED
Parainfluenza Virus 2: NOT DETECTED
Parainfluenza Virus 3: NOT DETECTED
Parainfluenza Virus 4: NOT DETECTED
Respiratory Syncytial Virus: NOT DETECTED
Rhinovirus / Enterovirus: NOT DETECTED

## 2018-10-15 LAB — CREATININE, URINE, RANDOM: Creatinine, Urine: 161.69 mg/dL

## 2018-10-15 LAB — URINALYSIS, ROUTINE W REFLEX MICROSCOPIC
BILIRUBIN URINE: NEGATIVE
Glucose, UA: NEGATIVE mg/dL
Ketones, ur: NEGATIVE mg/dL
Leukocytes, UA: NEGATIVE
Nitrite: NEGATIVE
Protein, ur: 100 mg/dL — AB
SPECIFIC GRAVITY, URINE: 1.015 (ref 1.005–1.030)
pH: 5 (ref 5.0–8.0)

## 2018-10-15 LAB — CBC
HCT: 37.1 % (ref 36.0–46.0)
Hemoglobin: 10.8 g/dL — ABNORMAL LOW (ref 12.0–15.0)
MCH: 28.6 pg (ref 26.0–34.0)
MCHC: 29.1 g/dL — ABNORMAL LOW (ref 30.0–36.0)
MCV: 98.1 fL (ref 80.0–100.0)
Platelets: 160 10*3/uL (ref 150–400)
RBC: 3.78 MIL/uL — AB (ref 3.87–5.11)
RDW: 17.1 % — ABNORMAL HIGH (ref 11.5–15.5)
WBC: 19.1 10*3/uL — ABNORMAL HIGH (ref 4.0–10.5)
nRBC: 0 % (ref 0.0–0.2)

## 2018-10-15 LAB — HEMOGLOBIN A1C
Hgb A1c MFr Bld: 6.7 % — ABNORMAL HIGH (ref 4.8–5.6)
Mean Plasma Glucose: 145.59 mg/dL

## 2018-10-15 LAB — LACTIC ACID, PLASMA: Lactic Acid, Venous: 1.1 mmol/L (ref 0.5–1.9)

## 2018-10-15 LAB — SODIUM, URINE, RANDOM: Sodium, Ur: 14 mmol/L

## 2018-10-15 LAB — MRSA PCR SCREENING: MRSA BY PCR: POSITIVE — AB

## 2018-10-15 MED ORDER — SODIUM CHLORIDE 0.9 % IV BOLUS: 250.0000 mL | Freq: Once | INTRAVENOUS | Status: DC

## 2018-10-15 MED ORDER — LACTATED RINGERS IV BOLUS
250.0000 mL | Freq: Once | INTRAVENOUS | Status: AC
  Administered 2018-10-15: 250 mL via INTRAVENOUS

## 2018-10-15 MED ORDER — DEXTROSE 5 % IV SOLN
INTRAVENOUS | Status: DC
  Administered 2018-10-15 – 2018-10-16 (×3): via INTRAVENOUS

## 2018-10-15 NOTE — Progress Notes (Signed)
Patient has had periodic drops in blood pressure likely related to being asleep she overall is more calm conversant and oriented than she has been previously  Called LM with daughter of  Heather Zimmerman on the phone to update-no answer   of Will attempt again later

## 2018-10-15 NOTE — Progress Notes (Signed)
CRITICAL VALUE ALERT  Critical Value:  Lactic Acid 2.2  Date & Time Notied:  10/15/2018 - 0230  Provider Notified: Tylene Fantasia  Orders Received/Actions taken:  LR bolus 250 mL ordered and currently infusing. Will continue to monitor.

## 2018-10-15 NOTE — Progress Notes (Signed)
Patient has had no output this shift. Attempting to collect Urine culture and urinalysis. MD made aware. Verbal order for a Foley received. Foley placed and tolerated well. After procedure pt. States she has had the urge to urinate but couldn't go.   Blood pressure also running lower this am. MD made aware. MD orders placed for a bolus if MAP <50 and patient awake. Will continue to monitor.

## 2018-10-15 NOTE — Progress Notes (Signed)
[facility] PROGRESS NOTE  Alezandra Egli. [name] ZOX:[id number] DOB: 07-24-1946 DOA: 10/14/2018 PCP: Patient, No Pcp Per   Narrative: 73 year old female skilled nursing resident Super morbid obesity BMI 48 History of COPD, obstructive sleep apnea and on BiPAP at night Peripheral vascular disease in addition to right BKA 2013 CKD 3 baseline creatinine 1.4 Diabetes mellitus type 2 on insulin-prior diabetic ulcer 03/2018 HTN Chronic diastolic heart failure EF most recently 04/01/2018 EF 60-65% Left kidney cyst Prior GI evaluation 11/19 for rectal versus vaginal bleeding in the setting of IUD-unclear source and no colonoscopy felt to be high risk for procedure.  Admission 03/31/2018-04/11/2018-->hypoxemia and hypoxic respiratory failure with need for critical care involvement and BiPAP-pH was 7.1 PCO2 was over 100--had coagulase-negative staph and was treated with antibiotics also admission complicated by AKI peak creatinine 2.5  Also had an admission 8/15-8/18 with?  Meningitis UTI/abdominal wall cellulitis  Patient presented obtunded from [facility] facility-at baseline wheelchair bound but does ADLs to some degree-found to have chills malaise and then after waking up noted to have fever  Emergency room work-up T-max 102.6 respirations 30-35, creatinine 2.9 over baseline of 1.4, WBC 24 Resuscitated initially with 2 L normal saline started treatment for sepsis unknown etiology vancomycin cefepime Flagyl-blood cultures drawn  A & Plan Sepsis unclear etiology-initial lactate 3.8Q sofa 2-covering broadly with antibiotics + Tamiflu await results--can discontinue Tamiflu if respiratory virus panel is negative-because of thigh pain and tenderness we will get a noncontrast CT to determine if there is any concern for abscess and determine further steps from there Acute hypoxic respiratory failure superimposed on chronic OSA/COPD-placed on BiPAP initially in emergency room Toxic metabolic  encephalopathy-multifactorial probably related to sepsis in the setting of OHS high risk meds-holding gabapentin 300 twice daily, Lexapro 10 daily--she has been able to come off of the BiPAP and is more awake oriented and verbalizes fairly well-nursing to inform if desats Acute kidney injury-trending slightly downward Hypovolemic hypernatremia-probably has free water losses so will be replacing- trial D5 75 cc/h and cover appropriately her blood sugars if needed 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-blood sugars ranging 170-190-continue Lantus 15 unit, add sliding scale PVD with prior BKA--see above discussion Hypernatremia--see above 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  Lovenox, presumed full code, no family, inpatient   Critically ill 30 minutes of critical care time spent seeing the patient-nursing to let me know on assessment if there is any next of kin I can contact she is stable but requires stepdown level monitoring   [name], MD  [facility] Direct contact: [phone] --Via amion app OR  --[email or web address]; password [email or web address]  7PM-7AM contact night coverage as above 10/15/2018, 7:37 AM  LOS: 1 day   Consultants:  None  Procedures:  None  Antimicrobials:  Vancomycin, cefepime, Flagyl started 10/15/2018  Interval history/Subjective: Initially when seen  seen patient is on BiPAP-however arouses readily and able to orient and placed on nasal cannula without significant drop in O2 sats When I lift her left leg she complains of severe pain in the upper thigh she is able to verbalize and tell me the year but seems a little bit confused Rectal temperature 102 and warm to touch Tells me she has no family in town  Objective:  Vitals:  Vitals:   10/15/18 0500 10/15/18 0600  BP: (!) 99/38 (!) 97/34  Pulse: 92 89  Resp: (!) 27 (!) 22  Temp:    SpO2: 97% 98%    Exam:  Awake obese thick neck  crusty lips mild strabismus no JVD S1-S2 no murmur rub or gallop  but distant sounds Abdomen soft obese nontender nondistended with pannus in the left lower pannus area she does have some denudation of the skin but this is minimal, on the back she has a buttock stage I-II decubitus ulcer On the left thigh she has a linear rash going up the thigh which is very painful to touch it is about 7 to 8 cm x 15 cm long and might have a dermatomal distribution although it does not look like shingles Left lower extremity has denudation of skin and chronic changes but no fresh oozing or purulence Neurologically she is intact able to follow commands lifts hands and legs appropriately to command is weak and debilitated   I have personally reviewed the following:  DATA   Labs:  Sodium up from 147-1 50  BUN/creatinine 49/1.9-->49/2.0  WBC down from 24-19  Lactic acid ranging between the 1 and 2 range currently 2.2   Decision to obtain old records:  Yes  Review and summation of old records:  Yes extensively reviewed  Scheduled Meds: . aspirin EC  81 mg Oral Daily  . enoxaparin (LOVENOX) injection  30 mg Subcutaneous Q24H  . escitalopram  10 mg Oral Daily  . insulin aspart  0-15 Units Subcutaneous TID WC  . insulin aspart  0-5 Units Subcutaneous QHS  . insulin glargine  15 Units Subcutaneous QHS  . ipratropium-albuterol  3 mL Nebulization Q6H  . mometasone-formoterol  2 puff Inhalation BID  . oseltamivir  30 mg Oral BID   Continuous Infusions: . sodium chloride    . ceFEPime (MAXIPIME) IV 1 g (10/15/18 0610)  . metronidazole Stopped (10/15/18 0743)  . [START ON 10/16/2018] vancomycin      Principal Problem:   Sepsis ([place]) Active Problems:   Chronic pain   Chronic diastolic heart failure (HCC)   Morbid obesity (HCC)   OSA treated with BiPAP   Hx of right BKA (HCC)   Venous ulcer of left leg (HCC)   CKD (chronic kidney disease) stage 3, GFR 30-59 ml/min (HCC)   Hypertensive heart disease with congestive heart failure and stage 3 kidney disease  (HCC)   Type II diabetes mellitus with neurological manifestations, uncontrolled (HCC)   Acute renal failure superimposed on stage 3 chronic kidney disease ([place])   Dyslipidemia associated with type 2 diabetes mellitus ([place])   Acute metabolic encephalopathy   Hypernatremia   LOS: 1 day

## 2018-10-16 ENCOUNTER — Telehealth: Payer: Self-pay | Admitting: *Deleted

## 2018-10-16 LAB — CBC WITH DIFFERENTIAL/PLATELET
Abs Immature Granulocytes: 0.13 10*3/uL — ABNORMAL HIGH (ref 0.00–0.07)
Basophils Absolute: 0 10*3/uL (ref 0.0–0.1)
Basophils Relative: 0 %
Eosinophils Absolute: 0 10*3/uL (ref 0.0–0.5)
Eosinophils Relative: 0 %
HCT: 33.4 % — ABNORMAL LOW (ref 36.0–46.0)
Hemoglobin: 9.7 g/dL — ABNORMAL LOW (ref 12.0–15.0)
Immature Granulocytes: 1 %
LYMPHS PCT: 11 %
Lymphs Abs: 1.4 10*3/uL (ref 0.7–4.0)
MCH: 27.3 pg (ref 26.0–34.0)
MCHC: 29 g/dL — ABNORMAL LOW (ref 30.0–36.0)
MCV: 94.1 fL (ref 80.0–100.0)
Monocytes Absolute: 0.5 10*3/uL (ref 0.1–1.0)
Monocytes Relative: 4 %
Neutro Abs: 11.2 10*3/uL — ABNORMAL HIGH (ref 1.7–7.7)
Neutrophils Relative %: 84 %
PLATELETS: 162 10*3/uL (ref 150–400)
RBC: 3.55 MIL/uL — ABNORMAL LOW (ref 3.87–5.11)
RDW: 17.1 % — ABNORMAL HIGH (ref 11.5–15.5)
WBC: 13.3 10*3/uL — AB (ref 4.0–10.5)
nRBC: 0 % (ref 0.0–0.2)

## 2018-10-16 LAB — BASIC METABOLIC PANEL
Anion gap: 11 (ref 5–15)
BUN: 52 mg/dL — AB (ref 8–23)
CO2: 24 mmol/L (ref 22–32)
Calcium: 7.7 mg/dL — ABNORMAL LOW (ref 8.9–10.3)
Chloride: 105 mmol/L (ref 98–111)
Creatinine, Ser: 2.17 mg/dL — ABNORMAL HIGH (ref 0.44–1.00)
GFR calc Af Amer: 25 mL/min — ABNORMAL LOW (ref >60)
GFR calc non Af Amer: 22 mL/min — ABNORMAL LOW (ref >60)
Glucose, Bld: 176 mg/dL — ABNORMAL HIGH (ref 70–99)
Potassium: 3.6 mmol/L (ref 3.5–5.1)
Sodium: 140 mmol/L (ref 135–145)

## 2018-10-16 LAB — GLUCOSE, CAPILLARY
Glucose-Capillary: 168 mg/dL — ABNORMAL HIGH (ref 70–99)
Glucose-Capillary: 202 mg/dL — ABNORMAL HIGH (ref 70–99)
Glucose-Capillary: 220 mg/dL — ABNORMAL HIGH (ref 70–99)
Glucose-Capillary: 295 mg/dL — ABNORMAL HIGH (ref 70–99)

## 2018-10-16 MED ORDER — VANCOMYCIN HCL IN DEXTROSE 1-5 GM/200ML-% IV SOLN
1000.0000 mg | INTRAVENOUS | Status: DC
  Administered 2018-10-16: 1000 mg via INTRAVENOUS
  Filled 2018-10-16 (×2): qty 200

## 2018-10-16 MED ORDER — MUPIROCIN 2 % EX OINT
1.0000 "application " | TOPICAL_OINTMENT | Freq: Two times a day (BID) | CUTANEOUS | Status: DC
  Administered 2018-10-16 – 2018-10-18 (×5): 1 via NASAL
  Filled 2018-10-16: qty 22

## 2018-10-16 MED ORDER — HYDROCERIN EX CREA
TOPICAL_CREAM | Freq: Every day | CUTANEOUS | Status: DC
  Administered 2018-10-16: 14:00:00 via TOPICAL
  Administered 2018-10-17 – 2018-10-18 (×2): 1 via TOPICAL
  Filled 2018-10-16 (×2): qty 113

## 2018-10-16 MED ORDER — FREE WATER
200.0000 mL | Freq: Three times a day (TID) | Status: DC
  Administered 2018-10-16 – 2018-10-18 (×7): 200 mL via ORAL

## 2018-10-16 MED ORDER — MAGIC MOUTHWASH W/LIDOCAINE
5.0000 mL | Freq: Three times a day (TID) | ORAL | Status: DC
  Administered 2018-10-16 – 2018-10-18 (×5): 5 mL via ORAL
  Filled 2018-10-16 (×7): qty 5

## 2018-10-16 MED ORDER — CHLORHEXIDINE GLUCONATE CLOTH 2 % EX PADS
6.0000 | MEDICATED_PAD | Freq: Every day | CUTANEOUS | Status: DC
  Administered 2018-10-16 – 2018-10-18 (×3): 6 via TOPICAL

## 2018-10-16 NOTE — Progress Notes (Signed)
[facility] PROGRESS NOTE  Heather Zimmerman. [name] PJK:[id number] DOB: 1946-10-07 DOA: 10/14/2018 PCP: Jodi Marble, MD   Narrative: 73 year old female skilled nursing resident Super morbid obesity BMI 48 History of COPD, obstructive sleep apnea and on BiPAP at night Peripheral vascular disease in addition to right BKA 2013 CKD 3 baseline creatinine 1.4 Diabetes mellitus type 2 on insulin-prior diabetic ulcer 03/2018 HTN Chronic diastolic heart failure EF most recently 04/01/2018 EF 60-65% Left kidney cyst Prior GI evaluation 11/19 for rectal versus vaginal bleeding in the setting of IUD-unclear source and no colonoscopy felt to be high risk for procedure.  Admission 03/31/2018-04/11/2018-->hypoxemia and hypoxic respiratory failure with need for critical care involvement and BiPAP-pH was 7.1 PCO2 was over 100--had coagulase-negative staph and was treated with antibiotics also admission complicated by AKI peak creatinine 2.5  Also had an admission 8/15-8/18 with?  Meningitis UTI/abdominal wall cellulitis  Patient presented obtunded from [place] facility-at baseline wheelchair bound but does ADLs to some degree-found to have chills malaise and then after waking up noted to have fever  Emergency room work-up T-max 102.6 respirations 30-35, creatinine 2.9 over baseline of 1.4, WBC 24 Resuscitated initially with 2 L normal saline started treatment for sepsis unknown etiology vancomycin cefepime Flagyl-blood cultures drawn  A & Plan Sepsis unclear etiology-sepsis physiology resolving but still slightly hypotensive and therefore still requires stepdown monitoring-discontinue Tamiflu-no objective source as CT not overly suggestive of infection in the left thigh we will wait on cultures  Acute hypoxic respiratory failure superimposed on chronic OSA/COPD-only nightly BiPAP now-transition to full diet feels she is relatively safe to take the same  Toxic metabolic  encephalopathy-multifactorial probably related to sepsis in the setting of OHS high risk meds-holding gabapentin 300 twice daily, Lexapro 10 daily  Acute kidney injury-trending slightly downward Hypovolemic hypernatremia-cut back IV fluid with D5 50 cc an hour and encourage free water  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-blood sugars ranging 1 60-1 80 range -continue Lantus 15 unit, add sliding scale  PVD with prior BKA--see above discussion  Hypernatremia--see above  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  Lovenox, presumed full code, no family, inpatient   Patient is stable but still has low-grade fevers and is slightly hypotensive so will need to be on the stepdown unit  I tried to call the daughter yesterday I did not reach her but will leave a message   [name], MD  [facility] Direct contact: [phone] --Via amion app OR  --[email or web address]; password [email or web address]  7PM-7AM contact night coverage as above 10/16/2018, 8:45 AM  LOS: 2 days   Consultants:  None  Procedures:  None  Antimicrobials:  Vancomycin, cefepime, Flagyl started 10/15/2018  Interval history/Subjective: More awake alert and somewhat coherent no obvious distress Tells me she was able to tolerate a diet her speech is somewhat unclear She still complains of thigh pain in the left thigh and asked me what this is but I reassured her that I do not think it is an abscess Still having low-grade fevers  Objective:  Vitals:  Vitals:   10/16/18 0748 10/16/18 0800  BP:    Pulse:    Resp:    Temp:  (!) 100.8 F (38.2 C)  SpO2: 100%     Exam:  Awake oriented but somewhat garbled speech although intelligible Chest is clear without added sound Abdomen is soft nontender and her pannus was not examined today Left thigh seems to be somewhat painful but is about the same as prior Right  stump is stable S1-S2 no murmur    I have personally reviewed the following:   DATA   Labs:  Sodium now down from 140 to the 150 range  BUN/creatinine stable in the 50/2.1 range  WBC down from 24-19-->13  Lactic acid ranging between the 1 and 2 range currently 2.2   Decision to obtain old records:  Yes  Review and summation of old records:  Yes extensively reviewed  Scheduled Meds: . aspirin EC  81 mg Oral Daily  . enoxaparin (LOVENOX) injection  30 mg Subcutaneous Q24H  . escitalopram  10 mg Oral Daily  . insulin aspart  0-15 Units Subcutaneous TID WC  . insulin aspart  0-5 Units Subcutaneous QHS  . insulin glargine  15 Units Subcutaneous QHS  . ipratropium-albuterol  3 mL Nebulization Q6H  . mometasone-formoterol  2 puff Inhalation BID   Continuous Infusions: . ceFEPime (MAXIPIME) IV Stopped (10/15/18 2139)  . dextrose Stopped (10/16/18 0604)  . metronidazole 500 mg (10/16/18 0604)  . sodium chloride Stopped (10/16/18 0113)  . vancomycin      Principal Problem:   Sepsis ([place]) Active Problems:   Chronic pain   Chronic diastolic heart failure (HCC)   Morbid obesity (HCC)   OSA treated with BiPAP   Hx of right BKA (HCC)   Venous ulcer of left leg (HCC)   CKD (chronic kidney disease) stage 3, GFR 30-59 ml/min (HCC)   Hypertensive heart disease with congestive heart failure and stage 3 kidney disease (HCC)   Type II diabetes mellitus with neurological manifestations, uncontrolled (HCC)   Acute renal failure superimposed on stage 3 chronic kidney disease (HCC)   Dyslipidemia associated with type 2 diabetes mellitus ([place])   Acute metabolic encephalopathy   Hypernatremia   LOS: 2 days

## 2018-10-16 NOTE — Progress Notes (Signed)
LCSW following for dc planning. Patient assessed in June 2019. No significant changes since last hospital visit.   LCSW received consult regarding discharge plan. Patient reported that she has resided at [facility] for the past few years. She always asks to change facilities, but has always been unsuccessful due to being long term Medicaid and multiple medical issues.   Patient does not have the financial resources to pay for transportation to a facility closer to her hometown.  PLAN: Patient will return to [place] at [facility].   LCSW to continue to follow and assist with discharge planning needs.   Carolin [place]

## 2018-10-16 NOTE — Consult Note (Signed)
[facility] Nurse wound consult note Reason for Consult: Consult requested for several locations.   Wound type: Right buttock with patchy areas of partial thickness skin loss related to moisture associated skin damage; affected area is approx 2X2 and there are 4 red moist wounds, each approx .2X.2X.1cm, no odor or drainage.  Pt has liquid stool at times and dressings are trapping stool underneath.  Leave open to air and protect with barrier cream. Inner thighs with same appearance; red and moist patchy areas of partial thickness wounds; 3 in one location, approx 1X1cm, each approx .2X.2X.1cm, no odor or drainage. Leg with patchy areas of lighter colored scar tissue from previous wounds which have healed and are intact skin.  Patchy areas of partial thickness wounds; .5X.5X.1cm and .3X.3X.1cm, each is pink and moist, no odor or drainage. Surrounded by dry peeling yellow scaly skin; appearance is consistent with venous stasis changes. Dressing procedure/placement/frequency: Foam dressing to protect and promote healing to leg partial thickness wounds.  Eucerin cream to promote healing and reduce itching to lower leg.  Barrier cream to buttocks to repel moisture.  Antifungal powder to thigh skin folds to promote healing.  Please re-consult if further assistance is needed.  Thank-you,  Julien Girt MSN, [place], [place], [place], [place]

## 2018-10-16 NOTE — Progress Notes (Signed)
Pharmacy Antibiotic Note  Heather Zimmerman is a 73 y.o. female admitted on 10/14/2018 with sepsis, unknown source.  Pharmacy has been consulted for Vancomycin and Cefepime dosing.  Metronidazole per MD.  Today, 10/16/18  Day #2 IV antibiotics  Lactate 3.8 --> 1.1  WBC 13.3, trending down  SCr 2.17, trending up. CrCl ~ 30 mL/min. Baseline SCr ~1.4 in 03/2018  Plan:  Metronidazole 500mg  IV q8h per MD  Cefepime 1g IV q12h  Vancomycin 2g IV x1 dose in ED, then 1000 mg IV q48h.  (SCr 2.17, est AUC 454)  Check vancomycin levels if remains on vancomycin > 3-4 days.  Goal AUC 400-500.  Follow up renal fxn, culture results, and clinical course.  F/u ability to de-escalate antibiotics.  Height: 5' 3.5" (161.3 cm) Weight: 275 lb 5.7 oz (124.9 kg) IBW/kg (Calculated) : 53.55  Temp (24hrs), Avg:99.4 F (37.4 C), Min:98.1 F (36.7 C), Max:100.8 F (38.2 C)  Recent Labs  Lab 10/14/18 1112 10/14/18 1300 10/14/18 1724 10/14/18 2226 10/15/18 0338 10/15/18 0745 10/16/18 0303  WBC 24.0*  --   --   --  19.1*  --  13.3*  CREATININE 2.09*  --   --  1.92* 2.00*  --  2.17*  LATICACIDVEN  --  3.88* 1.49 2.2*  --  1.1  --     Estimated Creatinine Clearance: 29.9 mL/min (A) (by C-G formula based on SCr of 2.17 mg/dL (H)).    Allergies  Allergen Reactions  . Ace Inhibitors Other (See Comments)    unknown    Antimicrobials this admission: 1/1 Cefepime >> 1/1 Vancomycin >>  1/1 Metronidazole >> 1/1 Tamiflu >> 1/2  Dose adjustments this admission: 1/3 vancomycin 1500 mg IV q48h >> 1 g IV q48h  Microbiology results: 1/1 BCx: ngtd 1/2 respiratory panel: negative 1/1 MRSA PCR: positive  Thank you for allowing pharmacy to be a part of this patient's care.  Lenis Noon, PharmD 10/16/18 11:13 AM

## 2018-10-16 NOTE — Telephone Encounter (Signed)
Faxed the office note to [facility]. Note from Alonza Bogus PA dated 08/28/2018.

## 2018-10-17 LAB — GLUCOSE, CAPILLARY
Glucose-Capillary: 195 mg/dL — ABNORMAL HIGH (ref 70–99)
Glucose-Capillary: 230 mg/dL — ABNORMAL HIGH (ref 70–99)
Glucose-Capillary: 189 mg/dL — ABNORMAL HIGH (ref 70–99)
Glucose-Capillary: 142 mg/dL — ABNORMAL HIGH (ref 70–99)

## 2018-10-17 LAB — CBC WITH DIFFERENTIAL/PLATELET
Abs Immature Granulocytes: 0.06 10*3/uL (ref 0.00–0.07)
Basophils Absolute: 0 10*3/uL (ref 0.0–0.1)
Basophils Relative: 0 %
Eosinophils Absolute: 0.1 10*3/uL (ref 0.0–0.5)
Eosinophils Relative: 1 %
HCT: 33.1 % — ABNORMAL LOW (ref 36.0–46.0)
Hemoglobin: 9.8 g/dL — ABNORMAL LOW (ref 12.0–15.0)
Immature Granulocytes: 1 %
LYMPHS PCT: 11 %
Lymphs Abs: 1.2 10*3/uL (ref 0.7–4.0)
MCH: 27.5 pg (ref 26.0–34.0)
MCHC: 29.6 g/dL — ABNORMAL LOW (ref 30.0–36.0)
MCV: 93 fL (ref 80.0–100.0)
Monocytes Absolute: 0.9 10*3/uL (ref 0.1–1.0)
Monocytes Relative: 8 %
Neutro Abs: 8.8 10*3/uL — ABNORMAL HIGH (ref 1.7–7.7)
Neutrophils Relative %: 79 %
Platelets: 165 10*3/uL (ref 150–400)
RBC: 3.56 MIL/uL — ABNORMAL LOW (ref 3.87–5.11)
RDW: 16.8 % — ABNORMAL HIGH (ref 11.5–15.5)
WBC: 11.1 10*3/uL — ABNORMAL HIGH (ref 4.0–10.5)
nRBC: 0 % (ref 0.0–0.2)

## 2018-10-17 LAB — BASIC METABOLIC PANEL
Anion gap: 10 (ref 5–15)
BUN: 49 mg/dL — ABNORMAL HIGH (ref 8–23)
CO2: 25 mmol/L (ref 22–32)
Calcium: 8.2 mg/dL — ABNORMAL LOW (ref 8.9–10.3)
Chloride: 104 mmol/L (ref 98–111)
Creatinine, Ser: 2 mg/dL — ABNORMAL HIGH (ref 0.44–1.00)
GFR calc Af Amer: 28 mL/min — ABNORMAL LOW (ref >60)
GFR calc non Af Amer: 24 mL/min — ABNORMAL LOW (ref >60)
Glucose, Bld: 184 mg/dL — ABNORMAL HIGH (ref 70–99)
Potassium: 3.5 mmol/L (ref 3.5–5.1)
Sodium: 139 mmol/L (ref 135–145)

## 2018-10-17 LAB — URINE CULTURE: Culture: NO GROWTH

## 2018-10-17 MED ORDER — LEVOFLOXACIN 500 MG PO TABS
250.0000 mg | ORAL_TABLET | Freq: Every day | ORAL | Status: DC
  Administered 2018-10-18: 250 mg via ORAL
  Filled 2018-10-17: qty 1

## 2018-10-17 MED ORDER — LEVOFLOXACIN 500 MG PO TABS
500.0000 mg | ORAL_TABLET | ORAL | Status: AC
  Administered 2018-10-17: 500 mg via ORAL
  Filled 2018-10-17: qty 1

## 2018-10-17 NOTE — Progress Notes (Signed)
PHARMACY NOTE:  ANTIMICROBIAL RENAL DOSAGE ADJUSTMENT  Current antimicrobial regimen includes a mismatch between antimicrobial dosage and estimated renal function.  As per policy approved by the Pharmacy & Therapeutics and Medical Executive Committees, the antimicrobial dosage will be adjusted accordingly.  Current antimicrobial dosage:  Levaquin 500mg  PO q48h  Indication: thigh infection  Renal Function:  Estimated Creatinine Clearance: 32.5 mL/min (A) (by C-G formula based on SCr of 2 mg/dL (H)). []      On intermittent HD, scheduled: []      On CRRT    Antimicrobial dosage has been changed to:  500mg  x 1, then 250mg  daily  Additional comments:   Thank you for allowing pharmacy to be a part of this patient's care.  Peggyann Juba, PharmD, BCPS Pharmacy: [phone] 10/17/2018 8:44 AM

## 2018-10-17 NOTE — Progress Notes (Addendum)
[facility] PROGRESS NOTE  Heather Zimmerman. [name] YQI:[id number] DOB: 1946-03-16 DOA: 10/14/2018 PCP: Jodi Marble, MD   Narrative: 73 year old female skilled nursing resident Super morbid obesity BMI 48 History of COPD, obstructive sleep apnea and on BiPAP at night Peripheral vascular disease in addition to right BKA 2013 CKD 3 baseline creatinine 1.4 Diabetes mellitus type 2 on insulin-prior diabetic ulcer 03/2018 HTN Chronic diastolic heart failure EF most recently 04/01/2018 EF 60-65% Left kidney cyst Prior GI evaluation 11/19 for rectal versus vaginal bleeding in the setting of IUD-unclear source and no colonoscopy felt to be high risk for procedure.  Admission 03/31/2018-04/11/2018-->hypoxemia and hypoxic respiratory failure with need for critical care involvement and BiPAP-pH was 7.1 PCO2 was over 100--had coagulase-negative staph and was treated with antibiotics also admission complicated by AKI peak creatinine 2.5  Also had an admission 8/15-8/18 with?  Meningitis UTI/abdominal wall cellulitis  Patient presented obtunded from [facility] facility-at baseline wheelchair bound but does ADLs to some degree-found to have chills malaise and then after waking up noted to have fever  Emergency room work-up T-max 102.6 respirations 30-35, creatinine 2.9 over baseline of 1.4, WBC 24 Resuscitated initially with 2 L normal saline started treatment for sepsis unknown etiology vancomycin cefepime Flagyl-blood cultures drawn  A & Plan Sepsis unclear etiology-sepsis physiology and hypotension resolving-Urine Culture pending att his time-BC x 2 NGTD-narrow to [id number] for empiric coverage of either Urinary or pulm cause Stale for floor transfer  Acute hypoxic respiratory failure / chronic OSA/COPD + restrictive phsyiology 2/2 Morbid obesity-initially on Bipap when admitted-now HS only  Toxic metabolic encephalopathy-multifactorial 2/2 sepsis in the setting of OHS high risk  meds-holding gabapentin 300 twice daily, Lexapro 10 daily  Acute kidney injury-baseline creat 1.5-suspect new baseline ~ 2.0  Hypovolemic hypernatremia-initially on saline/D5--Discontinue-force oral fluids 200 cc q8--encourage patient re: the same  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-blood sugars 184-290's -continue Lantus 15 unit and SSI  PVD with prior BKA--see above discussion  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  Lovenox, presumed full code, no family, inpatient   Patient is stable for floor transfer-monitor fever curve overnight--Asking PT to eval patient and CSW as nearing d/c probably in the next 24-48 hours   Juanjose Mojica, MD  [facility] Direct contact: [phone] --Via amion app OR  --[email or web address]; password [email or web address]  7PM-7AM contact night coverage as above 10/17/2018, 8:32 AM  LOS: 3 days   Consultants:  None  Procedures:  None  Antimicrobials:  Vancomycin, cefepime, Flagyl started 10/15/2018  Interval history/Subjective:  Just waking up  No distress other than "mouth is dry"  Objective:  Vitals:  Vitals:   10/17/18 0600 10/17/18 0700  BP: 114/65 (!) 142/71  Pulse:    Resp: 20 (!) 22  Temp:    SpO2:      Exam:  eomi thick neck s1 s 2no m abd poor exam-obese nt Pain in R thigh but no different than prior Neuro moves 4 limbs equally    I have personally reviewed the following:  DATA   Labs:  Sodium now down from 139 to the 150 range  BUN/creatinine stable in the 49/2.0  WBC down from 24-19-->13-->11  Lactic acid ranging between the 1 and 2 range currently 2.2---1.1   Decision to obtain old records:  Yes  Review and summation of old records:  Yes extensively reviewed  Scheduled Meds: . aspirin EC  81 mg Oral Daily  . Chlorhexidine Gluconate Cloth  6 each Topical Q0600  . enoxaparin (  LOVENOX) injection  30 mg Subcutaneous Q24H  . escitalopram  10 mg Oral Daily  . free water  200 mL Oral Q8H  .  hydrocerin   Topical Daily  . insulin aspart  0-15 Units Subcutaneous TID WC  . insulin aspart  0-5 Units Subcutaneous QHS  . insulin glargine  15 Units Subcutaneous QHS  . ipratropium-albuterol  3 mL Nebulization Q6H  . [START ON 10/18/2018] levofloxacin  250 mg Oral Daily  . levofloxacin  500 mg Oral Q48H  . magic mouthwash w/lidocaine  5 mL Oral TID  . mometasone-formoterol  2 puff Inhalation BID  . mupirocin ointment  1 application Nasal BID   Continuous Infusions: . ceFEPime (MAXIPIME) IV Stopped (10/16/18 2152)  . 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

## 2018-10-18 DIAGNOSIS — G4733 Obstructive sleep apnea (adult) (pediatric): Secondary | ICD-10-CM | POA: Diagnosis not present

## 2018-10-18 DIAGNOSIS — J9620 Acute and chronic respiratory failure, unspecified whether with hypoxia or hypercapnia: Secondary | ICD-10-CM | POA: Diagnosis present

## 2018-10-18 DIAGNOSIS — M6281 Muscle weakness (generalized): Secondary | ICD-10-CM | POA: Diagnosis present

## 2018-10-18 DIAGNOSIS — I7389 Other specified peripheral vascular diseases: Secondary | ICD-10-CM | POA: Diagnosis present

## 2018-10-18 DIAGNOSIS — I5032 Chronic diastolic (congestive) heart failure: Secondary | ICD-10-CM | POA: Diagnosis present

## 2018-10-18 DIAGNOSIS — F331 Major depressive disorder, recurrent, moderate: Secondary | ICD-10-CM | POA: Diagnosis not present

## 2018-10-18 DIAGNOSIS — R2689 Other abnormalities of gait and mobility: Secondary | ICD-10-CM | POA: Diagnosis present

## 2018-10-18 DIAGNOSIS — F329 Major depressive disorder, single episode, unspecified: Secondary | ICD-10-CM | POA: Diagnosis not present

## 2018-10-18 DIAGNOSIS — Z7401 Bed confinement status: Secondary | ICD-10-CM | POA: Diagnosis not present

## 2018-10-18 DIAGNOSIS — R6520 Severe sepsis without septic shock: Secondary | ICD-10-CM | POA: Diagnosis not present

## 2018-10-18 DIAGNOSIS — A419 Sepsis, unspecified organism: Secondary | ICD-10-CM | POA: Diagnosis not present

## 2018-10-18 DIAGNOSIS — Z79899 Other long term (current) drug therapy: Secondary | ICD-10-CM | POA: Diagnosis not present

## 2018-10-18 DIAGNOSIS — R293 Abnormal posture: Secondary | ICD-10-CM | POA: Diagnosis present

## 2018-10-18 DIAGNOSIS — I87312 Chronic venous hypertension (idiopathic) with ulcer of left lower extremity: Secondary | ICD-10-CM | POA: Diagnosis not present

## 2018-10-18 DIAGNOSIS — J9601 Acute respiratory failure with hypoxia: Secondary | ICD-10-CM | POA: Diagnosis present

## 2018-10-18 DIAGNOSIS — M2550 Pain in unspecified joint: Secondary | ICD-10-CM | POA: Diagnosis not present

## 2018-10-18 DIAGNOSIS — G8929 Other chronic pain: Secondary | ICD-10-CM | POA: Diagnosis not present

## 2018-10-18 DIAGNOSIS — I10 Essential (primary) hypertension: Secondary | ICD-10-CM | POA: Diagnosis not present

## 2018-10-18 DIAGNOSIS — B369 Superficial mycosis, unspecified: Secondary | ICD-10-CM | POA: Diagnosis not present

## 2018-10-18 DIAGNOSIS — F423 Hoarding disorder: Secondary | ICD-10-CM | POA: Diagnosis not present

## 2018-10-18 DIAGNOSIS — L03116 Cellulitis of left lower limb: Secondary | ICD-10-CM | POA: Diagnosis not present

## 2018-10-18 DIAGNOSIS — E119 Type 2 diabetes mellitus without complications: Secondary | ICD-10-CM | POA: Diagnosis present

## 2018-10-18 DIAGNOSIS — D649 Anemia, unspecified: Secondary | ICD-10-CM | POA: Diagnosis not present

## 2018-10-18 DIAGNOSIS — N183 Chronic kidney disease, stage 3 (moderate): Secondary | ICD-10-CM | POA: Diagnosis present

## 2018-10-18 DIAGNOSIS — G9341 Metabolic encephalopathy: Secondary | ICD-10-CM | POA: Diagnosis present

## 2018-10-18 DIAGNOSIS — G9340 Encephalopathy, unspecified: Secondary | ICD-10-CM | POA: Diagnosis not present

## 2018-10-18 DIAGNOSIS — E039 Hypothyroidism, unspecified: Secondary | ICD-10-CM | POA: Diagnosis not present

## 2018-10-18 LAB — CBC
HCT: 29.8 % — ABNORMAL LOW (ref 36.0–46.0)
Hemoglobin: 8.9 g/dL — ABNORMAL LOW (ref 12.0–15.0)
MCH: 27.8 pg (ref 26.0–34.0)
MCHC: 29.9 g/dL — AB (ref 30.0–36.0)
MCV: 93.1 fL (ref 80.0–100.0)
Platelets: 146 10*3/uL — ABNORMAL LOW (ref 150–400)
RBC: 3.2 MIL/uL — ABNORMAL LOW (ref 3.87–5.11)
RDW: 16.4 % — ABNORMAL HIGH (ref 11.5–15.5)
WBC: 10.3 10*3/uL (ref 4.0–10.5)
nRBC: 0 % (ref 0.0–0.2)

## 2018-10-18 LAB — RENAL FUNCTION PANEL
Albumin: 2 g/dL — ABNORMAL LOW (ref 3.5–5.0)
Anion gap: 8 (ref 5–15)
BUN: 42 mg/dL — ABNORMAL HIGH (ref 8–23)
CO2: 26 mmol/L (ref 22–32)
Calcium: 8.3 mg/dL — ABNORMAL LOW (ref 8.9–10.3)
Chloride: 105 mmol/L (ref 98–111)
Creatinine, Ser: 1.74 mg/dL — ABNORMAL HIGH (ref 0.44–1.00)
GFR calc Af Amer: 33 mL/min — ABNORMAL LOW (ref >60)
GFR calc non Af Amer: 29 mL/min — ABNORMAL LOW (ref >60)
Glucose, Bld: 143 mg/dL — ABNORMAL HIGH (ref 70–99)
Phosphorus: 2.9 mg/dL (ref 2.5–4.6)
Potassium: 3.6 mmol/L (ref 3.5–5.1)
Sodium: 139 mmol/L (ref 135–145)

## 2018-10-18 LAB — BASIC METABOLIC PANEL
Anion gap: 10 (ref 5–15)
BUN: 42 mg/dL — ABNORMAL HIGH (ref 8–23)
CO2: 26 mmol/L (ref 22–32)
CREATININE: 1.76 mg/dL — AB (ref 0.44–1.00)
Calcium: 8.3 mg/dL — ABNORMAL LOW (ref 8.9–10.3)
Chloride: 104 mmol/L (ref 98–111)
GFR calc non Af Amer: 28 mL/min — ABNORMAL LOW (ref >60)
GFR, EST AFRICAN AMERICAN: 33 mL/min — AB (ref >60)
Glucose, Bld: 147 mg/dL — ABNORMAL HIGH (ref 70–99)
Potassium: 3.7 mmol/L (ref 3.5–5.1)
Sodium: 140 mmol/L (ref 135–145)

## 2018-10-18 LAB — GLUCOSE, CAPILLARY: Glucose-Capillary: 123 mg/dL — ABNORMAL HIGH (ref 70–99)

## 2018-10-18 MED ORDER — TORSEMIDE 20 MG PO TABS: 20.0000 mg | ORAL_TABLET | ORAL | Status: AC

## 2018-10-18 MED ORDER — TRAMADOL HCL 50 MG PO TABS: 50.0000 mg | ORAL_TABLET | Freq: Two times a day (BID) | ORAL | 0 refills | Status: DC | PRN

## 2018-10-18 MED ORDER — NYSTATIN 100000 UNIT/GM EX POWD: Freq: Four times a day (QID) | CUTANEOUS | 0 refills | Status: DC

## 2018-10-18 MED ORDER — GABAPENTIN 100 MG PO CAPS: 300.0000 mg | ORAL_CAPSULE | Freq: Two times a day (BID) | ORAL | 0 refills | Status: DC

## 2018-10-18 MED ORDER — HYDROCERIN EX CREA: 1.0000 "application " | TOPICAL_CREAM | Freq: Every day | CUTANEOUS | 0 refills | Status: AC

## 2018-10-18 MED ORDER — FREE WATER: 200.0000 mL | Freq: Three times a day (TID) | Status: DC

## 2018-10-18 MED ORDER — FLUCONAZOLE 100 MG PO TABS: 100.0000 mg | ORAL_TABLET | Freq: Every day | ORAL | 0 refills | Status: AC

## 2018-10-18 MED ORDER — LEVOFLOXACIN 250 MG PO TABS: 250.0000 mg | ORAL_TABLET | Freq: Every day | ORAL | 0 refills | Status: AC

## 2018-10-18 NOTE — Discharge Summary (Signed)
Physician Discharge Summary  Heather Zimmerman. [name] VZD:[id number] DOB: [birth year] DOA: 10/14/2018  PCP: Jodi Marble, MD  Admit date: 10/14/2018 Discharge date: 10/18/2018  Time spent: 45 minutes  Recommendations for Outpatient Follow-up:  1. Complete Levaquin for presumed cellulitis of left thigh and will require 14 days of p.o. Diflucan for fungal intertrigo-please keep the areas in the lower abdomen dry with moisture wicking pads place nystatin powder every interval to ensure that local source control is obtained 2. Recommend turning every 2-3 hours 3. Wound care instructions for lower extremity as below-Dressing procedure/placement/frequency: Foam dressing to protect and promote healing to leg partial thickness wounds.  Eucerin cream to promote healing and reduce itching to lower leg.  Barrier cream to buttocks to repel moisture.  Antifungal powder to thigh skin folds to promote healing. 4. Note dosage change of Demadex to every other day and attempt to elevate leg on left side to promote drainage would not augment diuresis because of acute kidney injury-note dosage change of gabapentin to 100 mg twice daily from 300 mg and change of tramadol from every 8 to every 12 as patient had encephalopathy on admission-note initiation of free water because of volume depletion and please continue the same 5. Will need basic metabolic panel CBC 1 week 6. Suggest chest x-ray in 1 month 7. 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 3  kidney disease (HCC)   Type II diabetes mellitus with neurological manifestations, uncontrolled ([place])   Acute renal failure superimposed on stage 3 chronic kidney disease ([place])   Dyslipidemia associated with type 2 diabetes mellitus ([place])   Acute metabolic encephalopathy   Hypernatremia   Discharge Condition: Improved but overall guarded  Diet recommendation: Diabetic heart healthy  Filed Weights   10/16/18 0500 10/17/18 0438 10/18/18 0500  Weight: 124.9 kg 124.8 kg 126 kg    History of present illness:  73 year old female skilled nursing resident Super morbid obesity BMI 48 History of COPD, obstructive sleep apnea and on BiPAP at night Peripheral vascular disease in addition to right BKA 2013 CKD 3 baseline creatinine 1.4 Diabetes mellitus type 2 on insulin-prior diabetic ulcer 03/2018 HTN Chronic diastolic heart failure EF most recently 04/01/2018 EF 60-65% Left kidney cyst Prior GI evaluation 11/19 for rectal versus vaginal bleeding in the setting of IUD-unclear source and no colonoscopy felt to be high risk for procedure.  Admission 03/31/2018-04/11/2018-->hypoxemia and hypoxic respiratory failure with need for critical care involvement and BiPAP-pH was 7.1 PCO2 was over 100--had coagulase-negative staph and was treated with antibiotics also admission complicated by AKI peak creatinine 2.5  Also had an admission 8/15-8/18 with?  Meningitis UTI/abdominal wall cellulitis  Patient [facility]-at baseline wheelchair bound but does ADLs to some degree-found to have chills malaise and then after waking up noted to have fever  Emergency room work-up T-max 102.6 respirations 30-35, creatinine 2.9 over baseline of 1.4, WBC 24 Resuscitated initially with 2 L normal saline started treatment  for sepsis unknown etiology vancomycin cefepime Flagyl-blood cultures drawn   Hospital Course:  Sepsis presumed secondary to either fungal  intertrigo or infection of thigh with a mild cellulitis sepsis physiology and hypotension resolved on discharge-Urine Culture was negative on discharge blood cultures X2 were negative-it was presumed that patient had a local infection of probably the left thigh because she had pain in that area however CT scan did not confirm any abscess or drainable source-she will continue treatment with Levaquin I have initiated on discharge also Diflucan and she should have barrier methods of moisture wicking and attempts should be made at placing nystatin powder in the intertriginous areas to prevent further cellulitis    Acute hypoxic respiratory failure / chronic OSA/COPD + restrictive phsyiology 2/2 Morbid obesity-initially on Bipap when admitted-now HS only  Toxic metabolic encephalopathy-multifactorial 2/2 sepsis in the setting of OHS high risk meds-on admission we did hold gabapentin 300 twice daily, Lexapro 10 daily Would be careful with the use of tramadol and gabapentin for presumed pain in the area of her thigh graft overall her entire left thigh has pain but I feel we have reasonably controlled her symptoms at this time  Acute kidney injury-baseline creat 1.5-suspect new baseline ~ 2.0 Patient will require free water I have reinitiated Demadex however this will need to be cautiously used every other day not daily  Hypovolemic hypernatremia-initially on saline/D5--Discontinue-force oral fluids 200 cc q8--encourage patient re: the same Labs on discharge were stabilized and hypernatremia resolved  Diabetes mellitus type 2 reasonably controlled in the hospital-resume home sliding scale and instructions at the facility where she is at  PVD with prior BKA--see above discussion She has scars and wounds that are chronic to her left lower extremity please see above for instructions regarding dressing and use during use for the area please turn every 2 hours as possible  Super morbid obesity BMI  >45 bipolar  Procedures: IMPRESSION: 1. Soft tissue edema in the subcutaneous fat most severe along the medial aspect of the left thigh and to lesser extent along the lateral aspect. This may reflect reactive edema secondary to fluid overload or venous insufficiency versus less likely cellulitis. Overall appearance is unchanged compared with 01/01/2017.   Consultations:  None  Discharge Exam: Vitals:   10/18/18 0500 10/18/18 0600  BP: (!) 129/48 (!) 113/47  Pulse: 76 76  Resp: (!) 23 (!) 24  Temp:    SpO2: 100% 100%    General: Awake alert coherent eating breakfast able to play on her iPad on her chronic oxygen Cardiovascular: S1-S2 no murmur rub or gallop normal sinus rhythm on monitors Respiratory: Poor exam relatively clear Patient has denudation of the skin over the left lower extremity and cough as well as pretibial area looking like a burn which is chronic I did examine her bottom and she has a stage I decubitus ulcer otherwise no other breach in skin integrity  Discharge Instructions   Discharge Instructions    Diet - low sodium heart healthy   Complete by:  As directed    Increase activity slowly   Complete by:  As directed      Allergies as of 10/18/2018      Reactions   Ace Inhibitors Other (See Comments)   unknown      Medication List    TAKE these medications   acetaminophen 325 MG tablet Commonly known as:  TYLENOL Take 2 tablets (650 mg total) by mouth every 6 (six) hours as needed for mild  pain (or Fever >/= 101).   amLODipine 5 MG tablet Commonly known as:  NORVASC Take 5 mg by mouth daily.   aspirin EC 81 MG tablet Take 81 mg by mouth daily.   BASAGLAR KWIKPEN 100 UNIT/ML Sopn Inject 0-20 Units into the skin at bedtime. 0-18=0 units, 181-400=20 units   escitalopram 10 MG tablet Commonly known as:  LEXAPRO Take 10 mg by mouth daily.   fluconazole 100 MG tablet Commonly known as:  DIFLUCAN Take 1 tablet (100 mg total) by mouth daily  for 14 days.   Fluticasone-Salmeterol 250-50 MCG/DOSE Aepb Commonly known as:  ADVAIR Inhale 1 puff into the lungs every 12 (twelve) hours. Reported on 04/25/2016   free water Soln Take 200 mLs by mouth every 8 (eight) hours.   gabapentin 100 MG capsule Commonly known as:  NEURONTIN Take 3 capsules (300 mg total) by mouth 2 (two) times daily. What changed:  medication strength   GLUCAGON EMERGENCY 1 MG injection Generic drug:  glucagon Inject 1 mg into the vein once as needed (hypoglycemia).   hydrocerin Crea Apply 1 application topically daily.   insulin aspart 100 UNIT/ML injection Commonly known as:  novoLOG Inject 0-20 Units into the skin 2 (two) times daily before lunch and supper. What changed:    how much to take  additional instructions   ipratropium-albuterol 0.5-2.5 (3) MG/3ML Soln Commonly known as:  DUONEB Take 3 mLs by nebulization every 6 (six) hours as needed (shortness of breath).   levofloxacin 250 MG tablet Commonly known as:  LEVAQUIN Take 1 tablet (250 mg total) by mouth daily.   LONHALA MAGNAIR REFILL KIT 25 MCG/ML Soln Generic drug:  Glycopyrrolate Inhale 1 mL into the lungs 2 (two) times daily.   magnesium hydroxide 400 MG/5ML suspension Commonly known as:  MILK OF MAGNESIA Take 30 mLs by mouth daily as needed for mild constipation.   multivitamin with minerals tablet Take 1 tablet by mouth daily.   nystatin powder Commonly known as:  MYCOSTATIN/NYSTOP Apply topically 4 (four) times daily. Apply to inner thighs and intertriginous areas in the lower abdomen and keep the area dry   omeprazole 20 MG capsule Commonly known as:  PRILOSEC Take 20 mg by mouth daily.   OXYGEN Inhale 2 L/min into the lungs continuous. via nasal cannula   senna-docusate 8.6-50 MG tablet Commonly known as:  Senokot-S Take 2 tablets by mouth at bedtime.   sucralfate 1 g tablet Commonly known as:  CARAFATE Take 1 g by mouth 3 (three) times daily with meals.    torsemide 20 MG tablet Commonly known as:  DEMADEX Take 1 tablet (20 mg total) by mouth every other day. What changed:  when to take this   traMADol 50 MG tablet Commonly known as:  ULTRAM Take 1 tablet (50 mg total) by mouth every 12 (twelve) hours as needed for severe pain (pain). What changed:    when to take this  reasons to take this   vitamin C 500 MG tablet Commonly known as:  ASCORBIC ACID Take 500 mg by mouth 2 (two) times daily.   Vitamin D 50 MCG (2000 UT) tablet Take 2,000 Units by mouth daily.      Allergies  Allergen Reactions  . Ace Inhibitors Other (See Comments)    unknown      The results of significant diagnostics from this hospitalization (including imaging, microbiology, ancillary and laboratory) are listed below for reference.    Significant Diagnostic Studies: Ct Femur Left Wo  Contrast  Result Date: 10/15/2018 CLINICAL DATA:  Burn or corrosion to the left thigh, left thigh pain EXAM: CT OF THE LOWER LEFT EXTREMITY WITHOUT CONTRAST TECHNIQUE: Multidetector CT imaging of the lower left extremity was performed according to the standard protocol. COMPARISON:  01/01/2017 FINDINGS: Bones/Joint/Cartilage No fracture or dislocation. Normal alignment. No joint effusion. No periosteal reaction or bone destruction. No aggressive osseous lesion. Mild osteoarthritis of the medial and lateral femorotibial compartments. Severe osteoarthritis of the lateral patellofemoral compartment with subchondral cystic changes. Ligaments Ligaments are suboptimally evaluated by CT. Muscles and Tendons No focal muscle abnormality. No intramuscular fluid collection or hematoma. Quadriceps tendon and patellar tendon are intact. Soft tissue Soft tissue edema in the subcutaneous fat most severe along the medial aspect of the left thigh and to lesser extent along the lateral aspect. Soft tissue swelling with associated dystrophic calcifications in the anterolateral aspect of left lower leg.  No soft tissue emphysema. No soft tissue mass. Fat containing umbilical hernia. Indeterminate left iliac lymph node measuring 17 mm. IMPRESSION: 1. Soft tissue edema in the subcutaneous fat most severe along the medial aspect of the left thigh and to lesser extent along the lateral aspect. This may reflect reactive edema secondary to fluid overload or venous insufficiency versus less likely cellulitis. Overall appearance is unchanged compared with 01/01/2017. Electronically Signed   By: Kathreen Devoid   On: 10/15/2018 10:11   Dg Chest Port 1 View  Result Date: 10/14/2018 CLINICAL DATA:  72 year old female with history of weakness and shortness of breath. Confusion. EXAM: PORTABLE CHEST 1 VIEW COMPARISON:  Chest x-ray 04/07/2018. FINDINGS: Lung volumes are low. No consolidative airspace disease. No pleural effusions. No pneumothorax. No pulmonary nodule or mass noted. Pulmonary vasculature is within normal limits. Heart size is mildly enlarged. Upper mediastinal contours are within normal limits. Aortic atherosclerosis. IMPRESSION: 1. Low lung volumes without radiographic evidence of acute cardiopulmonary disease. 2. Aortic atherosclerosis. 3. Mild cardiomegaly. Electronically Signed   By: Vinnie Langton M.D.   On: 10/14/2018 11:35    Microbiology: Recent Results (from the past 240 hour(s))  Blood culture (routine x 2)     Status: None (Preliminary result)   Collection Time: 10/14/18 11:12 AM  Result Value Ref Range Status   Specimen Description BLOOD LEFT ANTECUBITAL  Final   Special Requests   Final    BOTTLES DRAWN AEROBIC AND ANAEROBIC Blood Culture adequate volume Performed at [facility] [address] [place], [place] [address]    Culture NO GROWTH 4 DAYS  Final   Report Status PENDING  Incomplete  Blood culture (routine x 2)     Status: None (Preliminary result)   Collection Time: 10/14/18  1:50 PM  Result Value Ref Range Status   Specimen Description BLOOD RIGHT  HAND  Final   Special Requests   Final    BOTTLES DRAWN AEROBIC AND ANAEROBIC Blood Culture results may not be optimal due to an inadequate volume of blood received in culture bottles Performed at [facility], [place] [address] [place], [place] [address]    Culture NO GROWTH 4 DAYS  Final   Report Status PENDING  Incomplete  MRSA PCR Screening     Status: Abnormal   Collection Time: 10/14/18 11:00 PM  Result Value Ref Range Status   MRSA by PCR POSITIVE (A) NEGATIVE Final    Comment:        The GeneXpert MRSA Assay (FDA approved for NASAL specimens only), is one component  of a comprehensive MRSA colonization surveillance program. It is not intended to diagnose MRSA infection nor to guide or monitor treatment for MRSA infections. RESULT CALLED TO, READ BACK BY AND VERIFIED WITH: [facility] RN AT 0155 10/15/18 BY TIBBITTS,K Performed at [facility] [address], [place], [place] [address]   Respiratory Panel by PCR     Status: None   Collection Time: 10/15/18  8:46 AM  Result Value Ref Range Status   Adenovirus NOT DETECTED NOT DETECTED Final   Coronavirus 229E NOT DETECTED NOT DETECTED Final   Coronavirus HKU1 NOT DETECTED NOT DETECTED Final   Coronavirus NL63 NOT DETECTED NOT DETECTED Final   Coronavirus OC43 NOT DETECTED NOT DETECTED Final   Metapneumovirus NOT DETECTED NOT DETECTED Final   Rhinovirus / Enterovirus NOT DETECTED NOT DETECTED Final   Influenza A NOT DETECTED NOT DETECTED Final   Influenza B NOT DETECTED NOT DETECTED Final   Parainfluenza Virus 1 NOT DETECTED NOT DETECTED Final   Parainfluenza Virus 2 NOT DETECTED NOT DETECTED Final   Parainfluenza Virus 3 NOT DETECTED NOT DETECTED Final   Parainfluenza Virus 4 NOT DETECTED NOT DETECTED Final   Respiratory Syncytial Virus NOT DETECTED NOT DETECTED Final   Bordetella pertussis NOT DETECTED NOT DETECTED Final   Chlamydophila pneumoniae NOT DETECTED NOT DETECTED Final    Mycoplasma pneumoniae NOT DETECTED NOT DETECTED Final    Comment: Performed at [facility], [place] [address] [place], [place] [address]  Urine culture     Status: None   Collection Time: 10/15/18  2:51 PM  Result Value Ref Range Status   Specimen Description   Final    URINE, RANDOM Performed at [facility] [address], [place], [place] [address]    Special Requests   Final    NONE Performed at [facility], [place] [address] [place], [place] [address]    Culture   Final    NO GROWTH Performed at [facility], [place] [address] [place], [place] [address]    Report Status 10/17/2018 FINAL  Final     Labs: Basic Metabolic Panel: Recent Labs  Lab 10/14/18 2226 10/15/18 0338 10/16/18 0303 10/17/18 0304 10/18/18 0318  NA 147* 150* 140 139 139  140  K 3.9 3.8 3.6 3.5 3.6  3.7  CL 109 111 105 104 105  104  CO2 _0 GLUCOSE 188* 173* 176* 184* 143*  147*  BUN 49* 49* 52* 49* 42*  42*  CREATININE 1.92* 2.00* 2.17* 2.00* 1.74*  1.76*  CALCIUM 8.1* 8.2* 7.7* 8.2* 8.3*  8.3*  PHOS  --   --   --   --  2.9   Liver Function Tests: Recent Labs  Lab 10/14/18 1112 10/18/18 0318  AST 27  --   ALT 18  --   ALKPHOS 60  --   BILITOT 0.9  --   PROT 7.7  --   ALBUMIN 2.7* 2.0*   No results for input(s): LIPASE, AMYLASE in the last 168 hours. Recent Labs  Lab 10/14/18 2226  AMMONIA 19   CBC: Recent Labs  Lab 10/14/18 1112 10/15/18 0338 10/16/18 0303 10/17/18 0304 10/18/18 0318  WBC 24.0* 19.1* 13.3* 11.1* 10.3  NEUTROABS 22.5*  --  11.2* 8.8*  --   HGB 10.8* 10.8* 9.7* 9.8* 8.9*  HCT 37.0 37.1 33.4* 33.1* 29.8*  MCV 96.4 98.1 94.1 93.0 93.1  PLT 211 160 162  165 146*   Cardiac Enzymes: Recent Labs  Lab 10/14/18 1112 10/14/18 2226  TROPONINI 0.03* 0.04*   BNP: BNP (last 3 results) Recent Labs    03/31/18 1305 04/02/18 0629  BNP 175.7* 18.3    ProBNP (last 3 results) No results for  input(s): PROBNP in the last 8760 hours.  CBG: Recent Labs  Lab 10/17/18 0809 10/17/18 1122 10/17/18 1837 10/17/18 2134 10/18/18 0737  GLUCAP 195* 230* 189* 142* 123*       Signed:  Nita Sells MD   [facility] 10/18/2018, 8:53 AM

## 2018-10-18 NOTE — Care Management Important Message (Signed)
Important Message  Patient Details  Name: Heather Zimmerman MRN: [id number] Date of Birth: [birth year]   Medicare Important Message Given:  Yes    Erenest Rasher, RN 10/18/2018, 10:10 AM

## 2018-10-18 NOTE — Progress Notes (Signed)
Patient discharging to [facility]. Confirmed bed at facility & faxed required docs. [facility] has been called for transport.  RN call report: [phone].  No more CSW needs. Signing off.   Pricilla Holm, MSW, [place] [facility] [phone]

## 2018-10-18 NOTE — NC FL2 (Addendum)
[place] LEVEL OF CARE SCREENING TOOL     IDENTIFICATION  Patient Name: Heather Zimmerman. Leap Birthdate: 07-13-1946 Sex: female Admission Date (Current Location): 10/14/2018  [place] and [place] Number:  Herbalist and Address:  [facility],  [place] [address] [place]      Provider Number: [id number]  Attending Physician Name and Address:  Nita Sells, MD  Relative Name and Phone Number:  Anette Guarneri: [phone]    Current Level of Care: Hospital Recommended Level of Care: [place] Prior Approval Number:    Date Approved/Denied:   PASRR Number:    Discharge Plan: SNF    Current Diagnoses: Patient Active Problem List   Diagnosis Date Noted  . Hypernatremia 10/14/2018  . Generalized abdominal pain 09/09/2018  . Pulmonary edema   . Acute respiratory failure with hypoxia and hypercapnia ([place]) 03/31/2018  . Endotracheal tube present   . Sepsis secondary to UTI ([place]) 04/29/2017  . Acute on chronic respiratory failure with hypoxia and hypercapnia ([place]) 04/29/2017  . Acute metabolic encephalopathy 32/44/0102  . Dyslipidemia associated with type 2 diabetes mellitus ([place]) 03/03/2017  . Positive D dimer   . Altered mental status 12/28/2016  . Acute renal failure superimposed on stage 3 chronic kidney disease ([place])   . Cellulitis of left thigh 09/23/2016  . UTI (urinary tract infection) 09/23/2016  . Oropharyngeal dysphagia   . Coag negative Staphylococcus bacteremia   . Chronic venous stasis dermatitis   . Weakness of right upper extremity 07/09/2016  . Type II diabetes mellitus with neurological manifestations, uncontrolled ([place]) 04/23/2016  . Ventral hernia without obstruction or gangrene 02/24/2016  . Chronic respiratory failure with hypercapnia ([place]) 02/24/2016  . Occult blood in stools 12/26/2015  . Hypoxemia   . AKI (acute kidney injury) ([place])   . 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 CKD (chronic kidney disease) stage 3, GFR 30-59 ml/min (HCC) 07/21/2015  . Cellulitis of left lower extremity 07/19/2015  . Acute encephalopathy 07/18/2015  . Open wound of left lower extremity 07/18/2015  . Sepsis ([place]) 07/18/2015  . Venous ulcer of left leg ([place]) 12/27/2014  . Hx of right BKA ([place]) 11/22/2014  . Peripheral autonomic neuropathy due to diabetes mellitus ([place]) 10/09/2014  . OSA treated with BiPAP 07/05/2014  . Acute on chronic respiratory failure with hypercapnia ([place]) 06/21/2014  . Morbid obesity ([place]) 08/30/2013  . Unspecified vitamin D deficiency 12/31/2012  . Disorder of magnesium metabolism 12/31/2012  . Depression 12/31/2012  . Chronic pain 12/31/2012  . Chronic diastolic heart failure ([place]) 12/31/2012  . GERD 12/31/2012    Orientation RESPIRATION BLADDER Height & Weight     Self, Time, Situation, Place  O2(3L) Indwelling catheter Weight: 277 lb 12.5 oz (126 kg) Height:  5' 3.5" (161.3 cm)  BEHAVIORAL SYMPTOMS/MOOD NEUROLOGICAL BOWEL NUTRITION STATUS      Continent Diet(Diabetic heart healthy)  AMBULATORY STATUS COMMUNICATION OF NEEDS Skin   Extensive Assist Verbally Other (Comment)     Right buttock with patchy areas of partial thickness skin loss related to moisture associated skin damage;   Inner thighs with same appearance; red and moist patchy areas of partial thickness wounds;   Leg with patchy areas of lighter colored scar tissue from previous wounds which have healed and are intact skin.                    Personal Care Assistance  Level of Assistance  Bathing, Feeding, Dressing Bathing Assistance: Maximum assistance Feeding assistance: Limited assistance Dressing Assistance: Maximum assistance     Functional Limitations Info  Speech, Hearing, Sight Sight Info: Adequate Hearing Info: Adequate Speech Info: Adequate    SPECIAL CARE FACTORS FREQUENCY                        Contractures Contractures Info: Not present    Additional Factors Info  Code Status, Allergies, Psychotropic Code Status Info: Full Allergies Info: ACE INHIBITORS  Psychotropic Info: Lexapro         Current Medications (10/18/2018):  This is the current hospital active medication list Current Facility-Administered Medications  Medication Dose Route Frequency Provider Last Rate Last Dose  . acetaminophen (TYLENOL) tablet 650 mg  650 mg Oral Q6H PRN Edwin Dada, MD   650 mg at 10/18/18 0422   Or  . acetaminophen (TYLENOL) suppository 650 mg  650 mg Rectal Q6H PRN Edwin Dada, MD   650 mg at 10/15/18 0730  . albuterol (PROVENTIL) (2.5 MG/3ML) 0.083% nebulizer solution 2.5 mg  2.5 mg Nebulization Q2H PRN Danford, Suann Larry, MD      . aspirin EC tablet 81 mg  81 mg Oral Daily Edwin Dada, MD   81 mg at 10/17/18 1829  . Chlorhexidine Gluconate Cloth 2 % PADS 6 each  6 each Topical Q0600 Nita Sells, MD   6 each at 10/18/18 0600  . enoxaparin (LOVENOX) injection 30 mg  30 mg Subcutaneous Q24H Danford, Suann Larry, MD   30 mg at 10/17/18 2245  . escitalopram (LEXAPRO) tablet 10 mg  10 mg Oral Daily Danford, Suann Larry, MD   10 mg at 10/17/18 0937  . free water 200 mL  200 mL Oral Q8H Samtani, Jai-Gurmukh, MD   200 mL at 10/18/18 0600  . hydrocerin (EUCERIN) cream   Topical Daily Nita Sells, MD   1 application at 93/71/69 [id number]  . insulin aspart (novoLOG) injection 0-15 Units  0-15 Units Subcutaneous TID WC Danford, Suann Larry, MD   3 Units at 10/17/18 1837  . insulin aspart (novoLOG) injection 0-5 Units  0-5 Units Subcutaneous QHS Edwin Dada, MD   3 Units at 10/16/18 2126  . insulin glargine (LANTUS) injection 15 Units  15 Units Subcutaneous QHS Edwin Dada, MD   15 Units at 10/17/18 2245  . ipratropium-albuterol (DUONEB) 0.5-2.5 (3) MG/3ML nebulizer solution 3 mL  3 mL Nebulization Q6H Danford, Suann Larry, MD   3 mL at 10/18/18 0930  . levofloxacin (LEVAQUIN) tablet 250 mg  250 mg Oral Daily Nita Sells, MD      . magic mouthwash w/lidocaine  5 mL Oral TID Nita Sells, MD   5 mL at 10/17/18 2245  . mometasone-formoterol (DULERA) 200-5 MCG/ACT inhaler 2 puff  2 puff Inhalation BID Edwin Dada, MD   2 puff at 10/18/18 0931  . mupirocin ointment (BACTROBAN) 2 % 1 application  1 application Nasal BID Nita Sells, MD   1 application at 38/10/17 2245  . ondansetron (ZOFRAN) tablet 4 mg  4 mg Oral Q6H PRN Danford, Suann Larry, MD       Or  . ondansetron (ZOFRAN) injection 4 mg  4 mg Intravenous Q6H PRN Danford, Suann Larry, MD      . senna-docusate (Senokot-S) tablet 1 tablet  1 tablet Oral QHS PRN Danford, Suann Larry, MD      . sodium chloride 0.9 %  bolus 250 mL  250 mL Intravenous Once Nita Sells, MD   Stopped at 10/16/18 0113     Discharge Medications: Please see discharge summary for a list of discharge medications.  Relevant Imaging Results:  Relevant Lab Results:   Additional Information SSN: [id number]  Pricilla Holm, [place]

## 2018-10-19 DIAGNOSIS — B369 Superficial mycosis, unspecified: Secondary | ICD-10-CM | POA: Diagnosis not present

## 2018-10-19 DIAGNOSIS — L03116 Cellulitis of left lower limb: Secondary | ICD-10-CM | POA: Diagnosis not present

## 2018-10-19 DIAGNOSIS — G4733 Obstructive sleep apnea (adult) (pediatric): Secondary | ICD-10-CM | POA: Diagnosis not present

## 2018-10-19 DIAGNOSIS — I5032 Chronic diastolic (congestive) heart failure: Secondary | ICD-10-CM | POA: Diagnosis not present

## 2018-10-19 LAB — CULTURE, BLOOD (ROUTINE X 2)
CULTURE: NO GROWTH
Culture: NO GROWTH
Special Requests: ADEQUATE

## 2018-10-21 DIAGNOSIS — G8929 Other chronic pain: Secondary | ICD-10-CM | POA: Diagnosis not present

## 2018-10-21 DIAGNOSIS — M6281 Muscle weakness (generalized): Secondary | ICD-10-CM | POA: Diagnosis not present

## 2018-10-21 DIAGNOSIS — I87312 Chronic venous hypertension (idiopathic) with ulcer of left lower extremity: Secondary | ICD-10-CM | POA: Diagnosis not present

## 2018-10-22 DIAGNOSIS — L03116 Cellulitis of left lower limb: Secondary | ICD-10-CM | POA: Diagnosis not present

## 2018-10-22 DIAGNOSIS — I10 Essential (primary) hypertension: Secondary | ICD-10-CM | POA: Diagnosis not present

## 2018-10-22 DIAGNOSIS — A419 Sepsis, unspecified organism: Secondary | ICD-10-CM | POA: Diagnosis not present

## 2018-10-22 DIAGNOSIS — B369 Superficial mycosis, unspecified: Secondary | ICD-10-CM | POA: Diagnosis not present

## 2018-10-27 DIAGNOSIS — F329 Major depressive disorder, single episode, unspecified: Secondary | ICD-10-CM | POA: Diagnosis not present

## 2018-10-28 DIAGNOSIS — I87312 Chronic venous hypertension (idiopathic) with ulcer of left lower extremity: Secondary | ICD-10-CM | POA: Diagnosis not present

## 2018-10-28 DIAGNOSIS — M6281 Muscle weakness (generalized): Secondary | ICD-10-CM | POA: Diagnosis not present

## 2018-10-28 DIAGNOSIS — G8929 Other chronic pain: Secondary | ICD-10-CM | POA: Diagnosis not present

## 2018-10-28 DIAGNOSIS — E119 Type 2 diabetes mellitus without complications: Secondary | ICD-10-CM | POA: Diagnosis not present

## 2018-10-30 DIAGNOSIS — F331 Major depressive disorder, recurrent, moderate: Secondary | ICD-10-CM | POA: Diagnosis not present

## 2018-10-30 DIAGNOSIS — F423 Hoarding disorder: Secondary | ICD-10-CM | POA: Diagnosis not present

## 2018-11-04 DIAGNOSIS — F329 Major depressive disorder, single episode, unspecified: Secondary | ICD-10-CM | POA: Diagnosis not present

## 2018-11-11 DIAGNOSIS — F329 Major depressive disorder, single episode, unspecified: Secondary | ICD-10-CM | POA: Diagnosis not present

## 2018-11-18 DIAGNOSIS — F329 Major depressive disorder, single episode, unspecified: Secondary | ICD-10-CM | POA: Diagnosis not present

## 2018-11-19 DIAGNOSIS — N939 Abnormal uterine and vaginal bleeding, unspecified: Secondary | ICD-10-CM | POA: Diagnosis not present

## 2018-11-19 DIAGNOSIS — A419 Sepsis, unspecified organism: Secondary | ICD-10-CM | POA: Diagnosis not present

## 2018-11-19 DIAGNOSIS — B369 Superficial mycosis, unspecified: Secondary | ICD-10-CM | POA: Diagnosis not present

## 2018-11-19 DIAGNOSIS — L03116 Cellulitis of left lower limb: Secondary | ICD-10-CM | POA: Diagnosis not present

## 2018-11-25 DIAGNOSIS — F329 Major depressive disorder, single episode, unspecified: Secondary | ICD-10-CM | POA: Diagnosis not present

## 2018-12-02 DIAGNOSIS — F329 Major depressive disorder, single episode, unspecified: Secondary | ICD-10-CM | POA: Diagnosis not present

## 2018-12-09 DIAGNOSIS — F329 Major depressive disorder, single episode, unspecified: Secondary | ICD-10-CM | POA: Diagnosis not present

## 2018-12-11 DIAGNOSIS — F331 Major depressive disorder, recurrent, moderate: Secondary | ICD-10-CM | POA: Diagnosis not present

## 2018-12-11 DIAGNOSIS — F423 Hoarding disorder: Secondary | ICD-10-CM | POA: Diagnosis not present

## 2018-12-23 DIAGNOSIS — F329 Major depressive disorder, single episode, unspecified: Secondary | ICD-10-CM | POA: Diagnosis not present

## 2018-12-30 DIAGNOSIS — F329 Major depressive disorder, single episode, unspecified: Secondary | ICD-10-CM | POA: Diagnosis not present

## 2018-12-31 DIAGNOSIS — F423 Hoarding disorder: Secondary | ICD-10-CM | POA: Diagnosis not present

## 2018-12-31 DIAGNOSIS — F331 Major depressive disorder, recurrent, moderate: Secondary | ICD-10-CM | POA: Diagnosis not present

## 2019-01-14 DIAGNOSIS — N938 Other specified abnormal uterine and vaginal bleeding: Secondary | ICD-10-CM | POA: Diagnosis not present

## 2019-01-18 DIAGNOSIS — N938 Other specified abnormal uterine and vaginal bleeding: Secondary | ICD-10-CM | POA: Diagnosis not present

## 2019-02-15 IMAGING — DX DG CHEST 1V PORT
1 series · 1 of 1 positions shown · non-contrast
Comparison: Chest radiograph dated 12/29/2016

CLINICAL DATA: 71-year-old female with shortness of breath and
sepsis.

EXAM:
PORTABLE CHEST 1 VIEW

[chest ap]
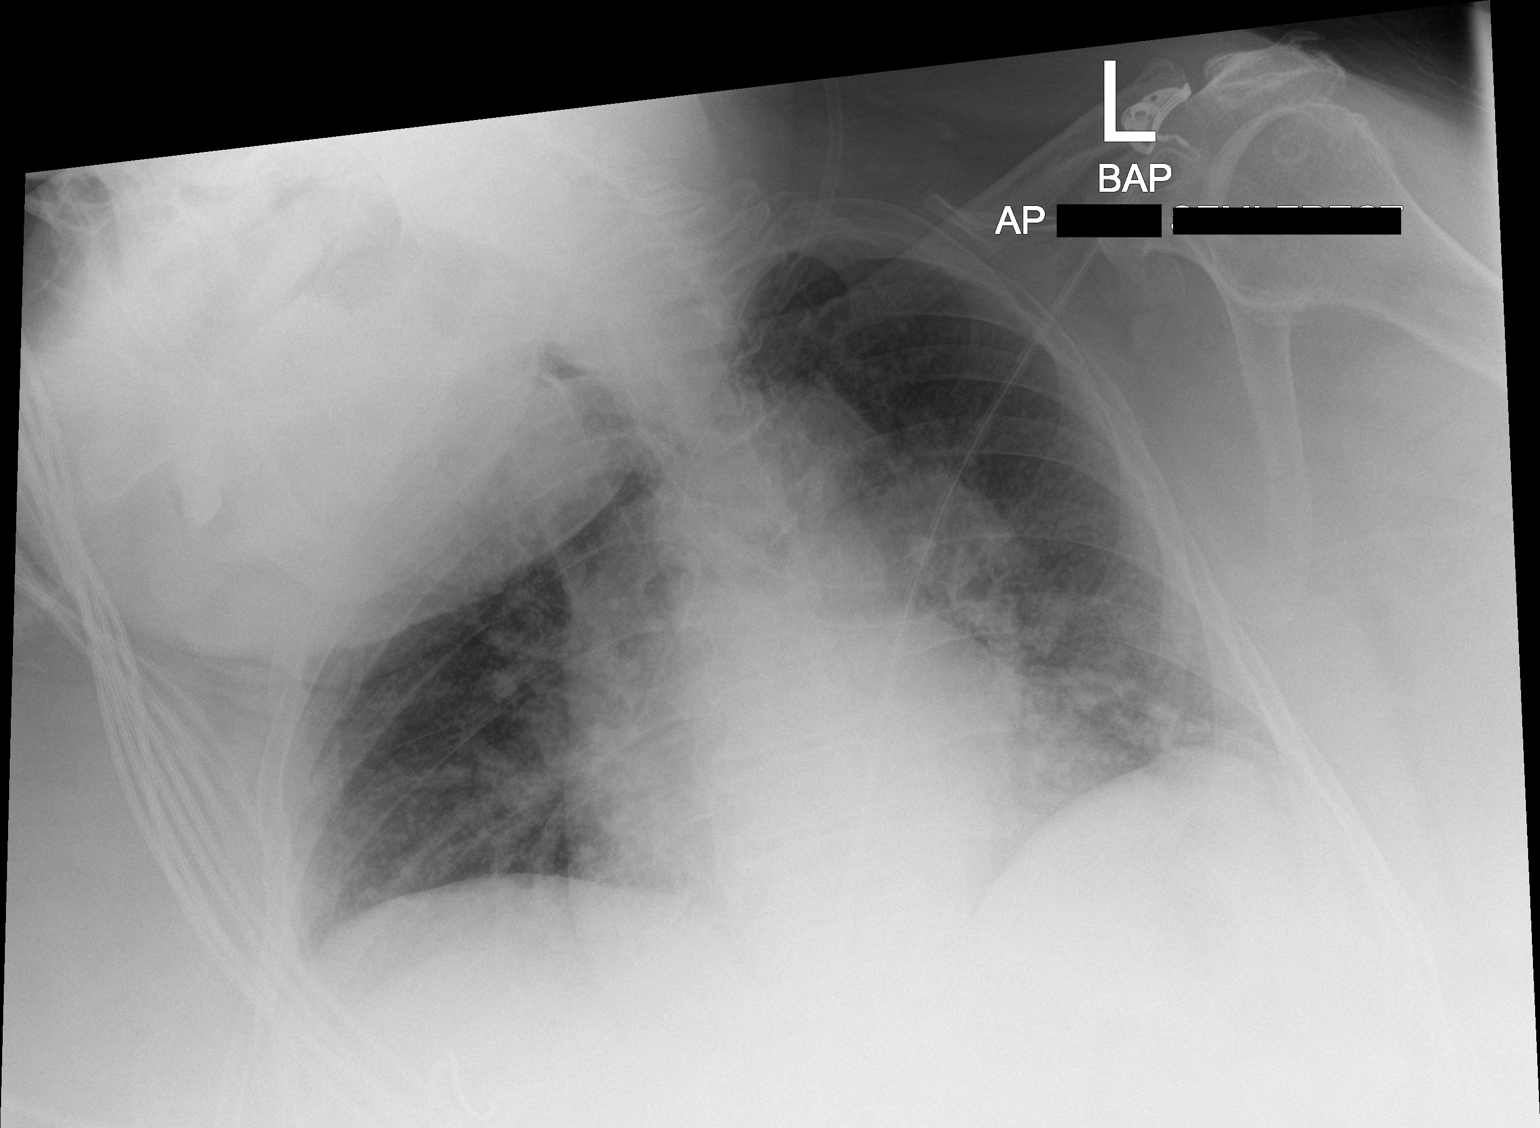

[1 of 1 positions shown; findings below may reference images not displayed]

FINDINGS: There is shallow inspiration. Minimal bibasilar atelectatic changes
noted. There is no focal consolidation, pleural effusion, or
pneumothorax. The right upper lobe and right apical region is not
well evaluated due to superimposition of the patient's jaw. Stable
mild cardiomegaly. Minimal central vascular prominence may represent
mild congestion. No acute osseous pathology.
IMPRESSION: 1. Shallow inspiration with bibasilar atelectatic changes.
2. Stable mild cardiomegaly with probable mild vascular congestion.

## 2019-02-17 DIAGNOSIS — J449 Chronic obstructive pulmonary disease, unspecified: Secondary | ICD-10-CM | POA: Diagnosis not present

## 2019-02-17 DIAGNOSIS — M1990 Unspecified osteoarthritis, unspecified site: Secondary | ICD-10-CM | POA: Diagnosis not present

## 2019-02-17 DIAGNOSIS — G4733 Obstructive sleep apnea (adult) (pediatric): Secondary | ICD-10-CM | POA: Diagnosis not present

## 2019-02-17 DIAGNOSIS — I10 Essential (primary) hypertension: Secondary | ICD-10-CM | POA: Diagnosis not present

## 2019-02-17 IMAGING — DX DG CHEST 1V PORT
1 series · 1 of 1 positions shown · non-contrast
Comparison: 04/28/2017

CLINICAL DATA: Shortness of breath

EXAM:
PORTABLE CHEST 1 VIEW

[chest]
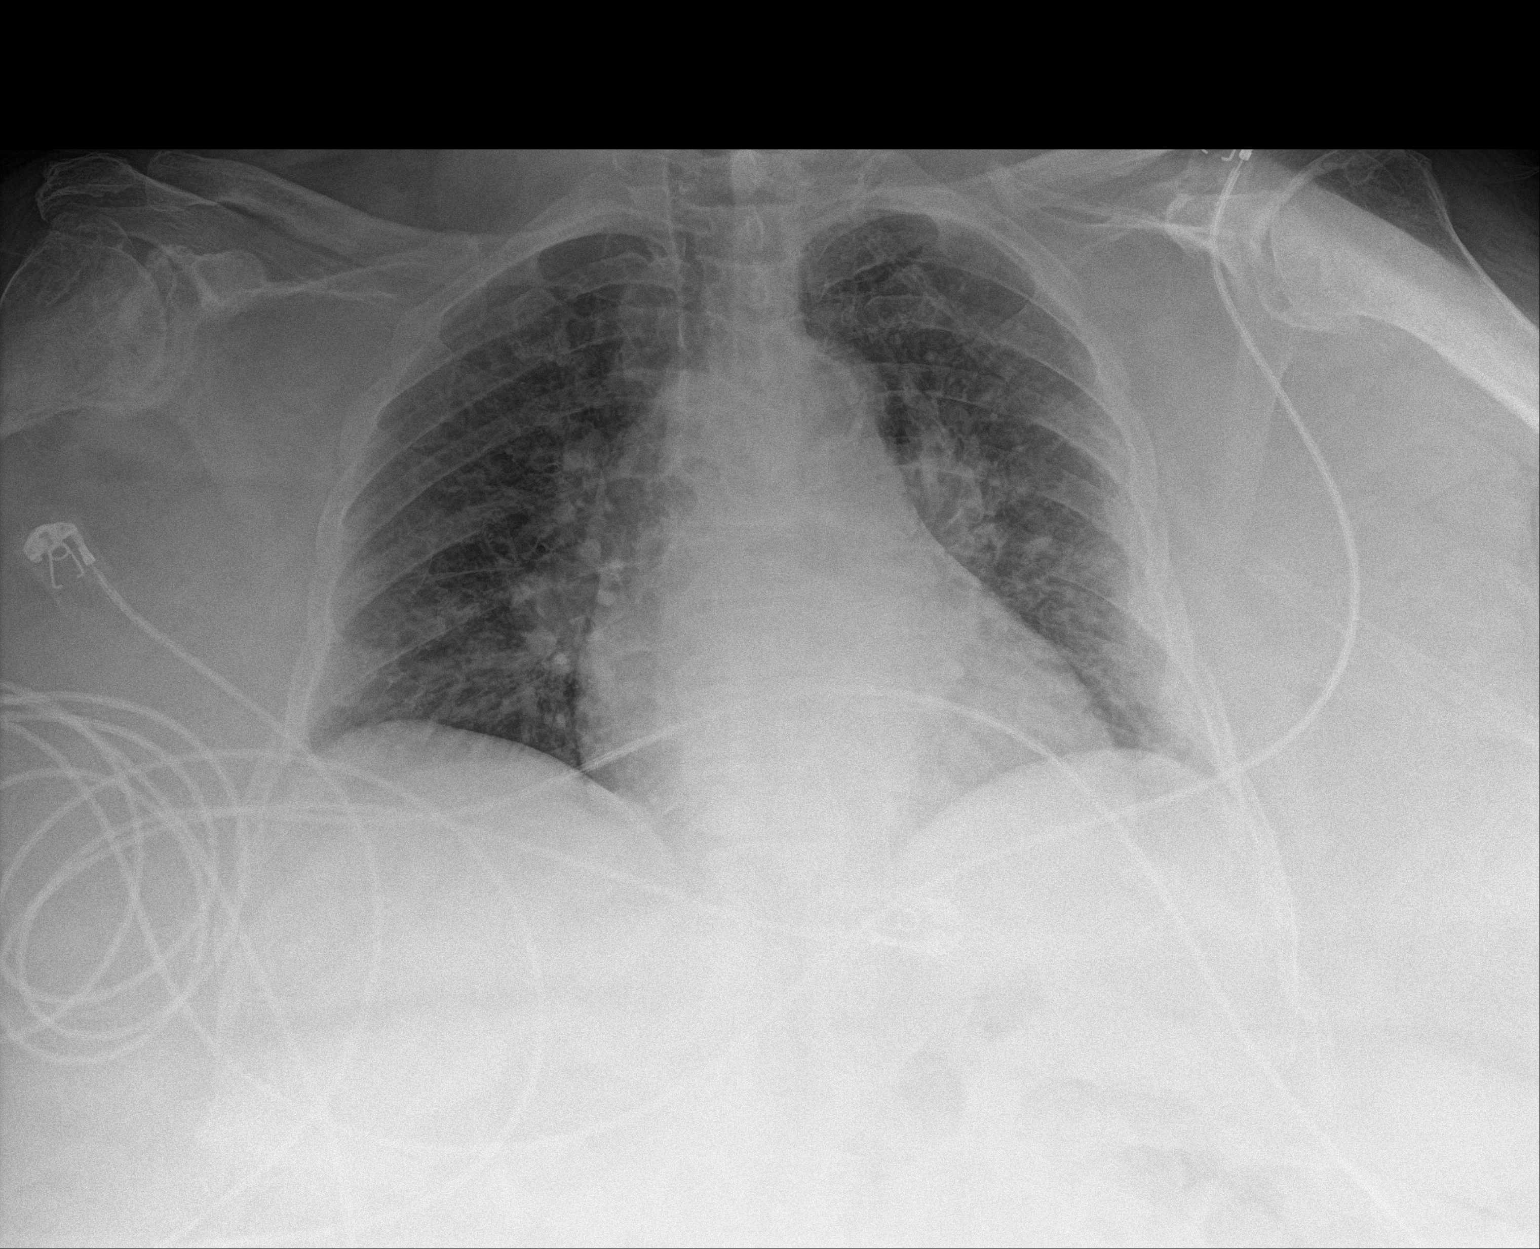

[1 of 1 positions shown; findings below may reference images not displayed]

FINDINGS: Borderline cardiomegaly. Stable aortic and hilar contours; patient
had mediastinal and hilar adenopathy on October 2016 chest CT. Low
volume chest with chronic interstitial prominence. No Kerley lines,
effusion, or air bronchogram.
IMPRESSION: Stable low volume chest without acute finding.

## 2019-10-28 ENCOUNTER — Emergency Department (HOSPITAL_COMMUNITY): Payer: Medicare (Managed Care)

## 2019-10-28 ENCOUNTER — Inpatient Hospital Stay (HOSPITAL_COMMUNITY): Payer: Medicare (Managed Care)

## 2019-10-28 ENCOUNTER — Inpatient Hospital Stay (HOSPITAL_COMMUNITY)
Admission: EM | Admit: 2019-10-28 | Discharge: 2019-11-15 | DRG: 207 | Disposition: E | Payer: Medicare (Managed Care) | Attending: Internal Medicine | Admitting: Internal Medicine

## 2019-10-28 ENCOUNTER — Encounter (HOSPITAL_COMMUNITY): Payer: Self-pay | Admitting: Specialist

## 2019-10-28 ENCOUNTER — Other Ambulatory Visit: Payer: Self-pay

## 2019-10-28 DIAGNOSIS — Z9981 Dependence on supplemental oxygen: Secondary | ICD-10-CM

## 2019-10-28 DIAGNOSIS — K219 Gastro-esophageal reflux disease without esophagitis: Secondary | ICD-10-CM | POA: Diagnosis present

## 2019-10-28 DIAGNOSIS — Z7951 Long term (current) use of inhaled steroids: Secondary | ICD-10-CM | POA: Diagnosis not present

## 2019-10-28 DIAGNOSIS — Z7982 Long term (current) use of aspirin: Secondary | ICD-10-CM | POA: Diagnosis not present

## 2019-10-28 DIAGNOSIS — R6521 Severe sepsis with septic shock: Secondary | ICD-10-CM | POA: Diagnosis not present

## 2019-10-28 DIAGNOSIS — Z6841 Body Mass Index (BMI) 40.0 and over, adult: Secondary | ICD-10-CM

## 2019-10-28 DIAGNOSIS — Z20822 Contact with and (suspected) exposure to covid-19: Secondary | ICD-10-CM | POA: Diagnosis present

## 2019-10-28 DIAGNOSIS — Z794 Long term (current) use of insulin: Secondary | ICD-10-CM

## 2019-10-28 DIAGNOSIS — G8929 Other chronic pain: Secondary | ICD-10-CM | POA: Diagnosis present

## 2019-10-28 DIAGNOSIS — J449 Chronic obstructive pulmonary disease, unspecified: Secondary | ICD-10-CM | POA: Diagnosis present

## 2019-10-28 DIAGNOSIS — J9622 Acute and chronic respiratory failure with hypercapnia: Principal | ICD-10-CM | POA: Diagnosis present

## 2019-10-28 DIAGNOSIS — R609 Edema, unspecified: Secondary | ICD-10-CM | POA: Diagnosis not present

## 2019-10-28 DIAGNOSIS — Z8249 Family history of ischemic heart disease and other diseases of the circulatory system: Secondary | ICD-10-CM | POA: Diagnosis not present

## 2019-10-28 DIAGNOSIS — E1122 Type 2 diabetes mellitus with diabetic chronic kidney disease: Secondary | ICD-10-CM | POA: Diagnosis present

## 2019-10-28 DIAGNOSIS — E559 Vitamin D deficiency, unspecified: Secondary | ICD-10-CM | POA: Diagnosis present

## 2019-10-28 DIAGNOSIS — R579 Shock, unspecified: Secondary | ICD-10-CM

## 2019-10-28 DIAGNOSIS — N183 Chronic kidney disease, stage 3 unspecified: Secondary | ICD-10-CM | POA: Diagnosis present

## 2019-10-28 DIAGNOSIS — I130 Hypertensive heart and chronic kidney disease with heart failure and stage 1 through stage 4 chronic kidney disease, or unspecified chronic kidney disease: Secondary | ICD-10-CM | POA: Diagnosis present

## 2019-10-28 DIAGNOSIS — Z89511 Acquired absence of right leg below knee: Secondary | ICD-10-CM

## 2019-10-28 DIAGNOSIS — Z79899 Other long term (current) drug therapy: Secondary | ICD-10-CM

## 2019-10-28 DIAGNOSIS — J9621 Acute and chronic respiratory failure with hypoxia: Secondary | ICD-10-CM | POA: Diagnosis present

## 2019-10-28 DIAGNOSIS — J9692 Respiratory failure, unspecified with hypercapnia: Secondary | ICD-10-CM | POA: Diagnosis present

## 2019-10-28 DIAGNOSIS — Z4659 Encounter for fitting and adjustment of other gastrointestinal appliance and device: Secondary | ICD-10-CM

## 2019-10-28 DIAGNOSIS — Z452 Encounter for adjustment and management of vascular access device: Secondary | ICD-10-CM

## 2019-10-28 DIAGNOSIS — I5043 Acute on chronic combined systolic (congestive) and diastolic (congestive) heart failure: Secondary | ICD-10-CM | POA: Diagnosis present

## 2019-10-28 DIAGNOSIS — Z66 Do not resuscitate: Secondary | ICD-10-CM | POA: Diagnosis not present

## 2019-10-28 DIAGNOSIS — F329 Major depressive disorder, single episode, unspecified: Secondary | ICD-10-CM | POA: Diagnosis present

## 2019-10-28 DIAGNOSIS — D6959 Other secondary thrombocytopenia: Secondary | ICD-10-CM | POA: Diagnosis present

## 2019-10-28 DIAGNOSIS — E861 Hypovolemia: Secondary | ICD-10-CM | POA: Diagnosis present

## 2019-10-28 DIAGNOSIS — N179 Acute kidney failure, unspecified: Secondary | ICD-10-CM

## 2019-10-28 DIAGNOSIS — R0602 Shortness of breath: Secondary | ICD-10-CM | POA: Diagnosis present

## 2019-10-28 DIAGNOSIS — J9602 Acute respiratory failure with hypercapnia: Secondary | ICD-10-CM

## 2019-10-28 DIAGNOSIS — E1151 Type 2 diabetes mellitus with diabetic peripheral angiopathy without gangrene: Secondary | ICD-10-CM | POA: Diagnosis present

## 2019-10-28 DIAGNOSIS — I2781 Cor pulmonale (chronic): Secondary | ICD-10-CM | POA: Diagnosis present

## 2019-10-28 DIAGNOSIS — L8990 Pressure ulcer of unspecified site, unspecified stage: Secondary | ICD-10-CM | POA: Diagnosis present

## 2019-10-28 DIAGNOSIS — J441 Chronic obstructive pulmonary disease with (acute) exacerbation: Secondary | ICD-10-CM | POA: Diagnosis not present

## 2019-10-28 DIAGNOSIS — Z9911 Dependence on respirator [ventilator] status: Secondary | ICD-10-CM

## 2019-10-28 DIAGNOSIS — I739 Peripheral vascular disease, unspecified: Secondary | ICD-10-CM | POA: Diagnosis not present

## 2019-10-28 DIAGNOSIS — E662 Morbid (severe) obesity with alveolar hypoventilation: Secondary | ICD-10-CM | POA: Diagnosis present

## 2019-10-28 DIAGNOSIS — R0902 Hypoxemia: Secondary | ICD-10-CM

## 2019-10-28 DIAGNOSIS — D72819 Decreased white blood cell count, unspecified: Secondary | ICD-10-CM | POA: Diagnosis present

## 2019-10-28 DIAGNOSIS — A419 Sepsis, unspecified organism: Secondary | ICD-10-CM | POA: Diagnosis not present

## 2019-10-28 LAB — CBC WITH DIFFERENTIAL/PLATELET
Abs Immature Granulocytes: 0.56 10*3/uL — ABNORMAL HIGH (ref 0.00–0.07)
Basophils Absolute: 0.1 10*3/uL (ref 0.0–0.1)
Basophils Relative: 1 %
Eosinophils Absolute: 0 10*3/uL (ref 0.0–0.5)
Eosinophils Relative: 1 %
HCT: 60 % — ABNORMAL HIGH (ref 36.0–46.0)
Hemoglobin: 15.4 g/dL — ABNORMAL HIGH (ref 12.0–15.0)
Immature Granulocytes: 10 %
Lymphocytes Relative: 12 %
Lymphs Abs: 0.7 10*3/uL (ref 0.7–4.0)
MCH: 28 pg (ref 26.0–34.0)
MCHC: 25.7 g/dL — ABNORMAL LOW (ref 30.0–36.0)
MCV: 109.1 fL — ABNORMAL HIGH (ref 80.0–100.0)
Monocytes Absolute: 0.8 10*3/uL (ref 0.1–1.0)
Monocytes Relative: 14 %
Neutro Abs: 3.6 10*3/uL (ref 1.7–7.7)
Neutrophils Relative %: 62 %
Platelets: 117 10*3/uL — ABNORMAL LOW (ref 150–400)
RBC: 5.5 MIL/uL — ABNORMAL HIGH (ref 3.87–5.11)
RDW: 17.2 % — ABNORMAL HIGH (ref 11.5–15.5)
WBC: 5.8 10*3/uL (ref 4.0–10.5)
nRBC: 1 % — ABNORMAL HIGH (ref 0.0–0.2)

## 2019-10-28 LAB — BLOOD GAS, ARTERIAL
Acid-Base Excess: 9.2 mmol/L — ABNORMAL HIGH (ref 0.0–2.0)
Acid-Base Excess: 8.5 mmol/L — ABNORMAL HIGH (ref 0.0–2.0)
Bicarbonate: 40.3 mmol/L — ABNORMAL HIGH (ref 20.0–28.0)
Bicarbonate: 36.5 mmol/L — ABNORMAL HIGH (ref 20.0–28.0)
Drawn by: 560021
Drawn by: 560021
FIO2: 100
FIO2: 100
O2 Saturation: 98.4 %
O2 Saturation: 99.6 %
Patient temperature: 37.5
Patient temperature: 37.5
pCO2 arterial: >120 mmHg (ref 32.0–48.0)
pCO2 arterial: 102 mmHg (ref 32.0–48.0)
pH, Arterial: 7.008 — CL (ref 7.350–7.450)
pH, Arterial: 7.184 — CL (ref 7.350–7.450)
pO2, Arterial: 138 mmHg — ABNORMAL HIGH (ref 83.0–108.0)
pO2, Arterial: 193 mmHg — ABNORMAL HIGH (ref 83.0–108.0)

## 2019-10-28 LAB — BASIC METABOLIC PANEL
Anion gap: 13 (ref 5–15)
Anion gap: 12 (ref 5–15)
BUN: 30 mg/dL — ABNORMAL HIGH (ref 8–23)
BUN: 37 mg/dL — ABNORMAL HIGH (ref 8–23)
CO2: 33 mmol/L — ABNORMAL HIGH (ref 22–32)
CO2: 34 mmol/L — ABNORMAL HIGH (ref 22–32)
Calcium: 8.5 mg/dL — ABNORMAL LOW (ref 8.9–10.3)
Calcium: 8 mg/dL — ABNORMAL LOW (ref 8.9–10.3)
Chloride: 96 mmol/L — ABNORMAL LOW (ref 98–111)
Chloride: 96 mmol/L — ABNORMAL LOW (ref 98–111)
Creatinine, Ser: 2.47 mg/dL — ABNORMAL HIGH (ref 0.44–1.00)
Creatinine, Ser: 2.56 mg/dL — ABNORMAL HIGH (ref 0.44–1.00)
GFR calc Af Amer: 22 mL/min — ABNORMAL LOW (ref >60)
GFR calc Af Amer: 21 mL/min — ABNORMAL LOW (ref >60)
GFR calc non Af Amer: 19 mL/min — ABNORMAL LOW (ref >60)
GFR calc non Af Amer: 18 mL/min — ABNORMAL LOW (ref >60)
Glucose, Bld: 155 mg/dL — ABNORMAL HIGH (ref 70–99)
Glucose, Bld: 142 mg/dL — ABNORMAL HIGH (ref 70–99)
Potassium: 5.1 mmol/L (ref 3.5–5.1)
Potassium: 4.3 mmol/L (ref 3.5–5.1)
Sodium: 142 mmol/L (ref 135–145)
Sodium: 142 mmol/L (ref 135–145)

## 2019-10-28 LAB — URINALYSIS, ROUTINE W REFLEX MICROSCOPIC
Bilirubin Urine: NEGATIVE
Glucose, UA: NEGATIVE mg/dL
Hgb urine dipstick: NEGATIVE
Ketones, ur: NEGATIVE mg/dL
Leukocytes,Ua: NEGATIVE
Nitrite: NEGATIVE
Protein, ur: 100 mg/dL — AB
Specific Gravity, Urine: 1.012 (ref 1.005–1.030)
pH: 5 (ref 5.0–8.0)

## 2019-10-28 LAB — BRAIN NATRIURETIC PEPTIDE: B Natriuretic Peptide: 153.3 pg/mL — ABNORMAL HIGH (ref 0.0–100.0)

## 2019-10-28 LAB — LACTIC ACID, PLASMA: Lactic Acid, Venous: 1 mmol/L (ref 0.5–1.9)

## 2019-10-28 LAB — GLUCOSE, CAPILLARY
Glucose-Capillary: 149 mg/dL — ABNORMAL HIGH (ref 70–99)
Glucose-Capillary: 134 mg/dL — ABNORMAL HIGH (ref 70–99)
Glucose-Capillary: 119 mg/dL — ABNORMAL HIGH (ref 70–99)
Glucose-Capillary: 116 mg/dL — ABNORMAL HIGH (ref 70–99)
Glucose-Capillary: 182 mg/dL — ABNORMAL HIGH (ref 70–99)

## 2019-10-28 LAB — POCT I-STAT 7, (LYTES, BLD GAS, ICA,H+H)
Acid-Base Excess: 13 mmol/L — ABNORMAL HIGH (ref 0.0–2.0)
Bicarbonate: 40.1 mmol/L — ABNORMAL HIGH (ref 20.0–28.0)
Calcium, Ion: 1.15 mmol/L (ref 1.15–1.40)
HCT: 42 % (ref 36.0–46.0)
Hemoglobin: 14.3 g/dL (ref 12.0–15.0)
O2 Saturation: 85 %
Patient temperature: 98.4
Potassium: 4.3 mmol/L (ref 3.5–5.1)
Sodium: 141 mmol/L (ref 135–145)
TCO2: 42 mmol/L — ABNORMAL HIGH (ref 22–32)
pCO2 arterial: 62.1 mmHg — ABNORMAL HIGH (ref 32.0–48.0)
pH, Arterial: 7.417 (ref 7.350–7.450)
pO2, Arterial: 51 mmHg — ABNORMAL LOW (ref 83.0–108.0)

## 2019-10-28 LAB — RAPID URINE DRUG SCREEN, HOSP PERFORMED
Amphetamines: NOT DETECTED
Barbiturates: NOT DETECTED
Benzodiazepines: NOT DETECTED
Cocaine: NOT DETECTED
Opiates: NOT DETECTED
Tetrahydrocannabinol: NOT DETECTED

## 2019-10-28 LAB — RESPIRATORY PANEL BY RT PCR (FLU A&B, COVID)
Influenza A by PCR: NEGATIVE
Influenza B by PCR: NEGATIVE
SARS Coronavirus 2 by RT PCR: NEGATIVE

## 2019-10-28 LAB — RESPIRATORY PANEL BY PCR

## 2019-10-28 LAB — PROCALCITONIN: Procalcitonin: <0.1 ng/mL

## 2019-10-28 LAB — TROPONIN I (HIGH SENSITIVITY): Troponin I (High Sensitivity): 60 ng/L — ABNORMAL HIGH (ref <18)

## 2019-10-28 LAB — STREP PNEUMONIAE URINARY ANTIGEN: Strep Pneumo Urinary Antigen: NEGATIVE

## 2019-10-28 LAB — MRSA PCR SCREENING: MRSA by PCR: POSITIVE — AB

## 2019-10-28 LAB — MAGNESIUM: Magnesium: 2 mg/dL (ref 1.7–2.4)

## 2019-10-28 LAB — POC SARS CORONAVIRUS 2 AG -  ED: SARS Coronavirus 2 Ag: NEGATIVE

## 2019-10-28 MED ORDER — SODIUM CHLORIDE 0.9 % IV SOLN: INTRAVENOUS | Status: DC | PRN

## 2019-10-28 MED ORDER — FENTANYL CITRATE (PF) 100 MCG/2ML IJ SOLN
25.0000 ug | INTRAMUSCULAR | Status: DC | PRN
  Administered 2019-10-28: 100 ug via INTRAVENOUS
  Administered 2019-10-28: 50 ug via INTRAVENOUS
  Administered 2019-10-28 – 2019-11-01 (×8): 100 ug via INTRAVENOUS
  Administered 2019-11-01: 50 ug via INTRAVENOUS
  Filled 2019-10-28 (×9): qty 2

## 2019-10-28 MED ORDER — LACTATED RINGERS IV BOLUS (SEPSIS): 1000.0000 mL | Freq: Once | INTRAVENOUS | Status: DC

## 2019-10-28 MED ORDER — ORAL CARE MOUTH RINSE
15.0000 mL | OROMUCOSAL | Status: DC
  Administered 2019-10-28 – 2019-11-04 (×68): 15 mL via OROMUCOSAL

## 2019-10-28 MED ORDER — SODIUM CHLORIDE 0.9 % IV SOLN
INTRAVENOUS | Status: DC | PRN
  Administered 2019-10-28: 250 mL via INTRAVENOUS
  Administered 2019-11-01: 500 mL via INTRAVENOUS

## 2019-10-28 MED ORDER — PROPOFOL 1000 MG/100ML IV EMUL
5.0000 ug/kg/min | INTRAVENOUS | Status: DC
  Administered 2019-10-28: 25 ug/kg/min via INTRAVENOUS
  Administered 2019-10-28: 35 ug/kg/min via INTRAVENOUS
  Administered 2019-10-29: 40 ug/kg/min via INTRAVENOUS
  Administered 2019-10-29: 04:00:00 30 ug/kg/min via INTRAVENOUS
  Filled 2019-10-28 (×5): qty 100

## 2019-10-28 MED ORDER — PRO-STAT SUGAR FREE PO LIQD
30.0000 mL | Freq: Four times a day (QID) | ORAL | Status: DC
  Administered 2019-10-28 – 2019-11-03 (×26): 30 mL
  Filled 2019-10-28 (×25): qty 30

## 2019-10-28 MED ORDER — PROPOFOL 1000 MG/100ML IV EMUL: 5.0000 ug/kg/min | INTRAVENOUS | Status: DC

## 2019-10-28 MED ORDER — PROPOFOL 1000 MG/100ML IV EMUL
INTRAVENOUS | Status: AC
  Filled 2019-10-28: qty 100

## 2019-10-28 MED ORDER — LACTATED RINGERS IV BOLUS
1000.0000 mL | Freq: Once | INTRAVENOUS | Status: AC
  Administered 2019-10-28: 1000 mL via INTRAVENOUS

## 2019-10-28 MED ORDER — LACTATED RINGERS IV BOLUS (SEPSIS)
1000.0000 mL | Freq: Once | INTRAVENOUS | Status: DC
  Administered 2019-10-28: 1000 mL via INTRAVENOUS

## 2019-10-28 MED ORDER — FENTANYL CITRATE (PF) 100 MCG/2ML IJ SOLN
25.0000 ug | INTRAMUSCULAR | Status: DC | PRN
  Filled 2019-10-28: qty 2

## 2019-10-28 MED ORDER — NOREPINEPHRINE 4 MG/250ML-% IV SOLN
0.0000 ug/min | INTRAVENOUS | Status: DC
  Administered 2019-10-28: 10 ug/min via INTRAVENOUS

## 2019-10-28 MED ORDER — BUDESONIDE 0.5 MG/2ML IN SUSP
0.5000 mg | Freq: Two times a day (BID) | RESPIRATORY_TRACT | Status: DC
  Administered 2019-10-28 – 2019-11-03 (×13): 0.5 mg via RESPIRATORY_TRACT
  Filled 2019-10-28 (×13): qty 2

## 2019-10-28 MED ORDER — ALBUTEROL SULFATE (2.5 MG/3ML) 0.083% IN NEBU: 2.5000 mg | INHALATION_SOLUTION | RESPIRATORY_TRACT | Status: DC | PRN

## 2019-10-28 MED ORDER — ASPIRIN 81 MG PO CHEW
81.0000 mg | CHEWABLE_TABLET | Freq: Every day | ORAL | Status: DC
  Administered 2019-10-28 – 2019-11-03 (×7): 81 mg
  Filled 2019-10-28 (×7): qty 1

## 2019-10-28 MED ORDER — FUROSEMIDE 10 MG/ML IJ SOLN
80.0000 mg | Freq: Once | INTRAMUSCULAR | Status: AC
  Administered 2019-10-28: 80 mg via INTRAVENOUS
  Filled 2019-10-28: qty 8

## 2019-10-28 MED ORDER — PNEUMOCOCCAL VAC POLYVALENT 25 MCG/0.5ML IJ INJ
0.5000 mL | INJECTION | INTRAMUSCULAR | Status: DC
  Filled 2019-10-28: qty 0.5

## 2019-10-28 MED ORDER — VITAL HIGH PROTEIN PO LIQD
1000.0000 mL | ORAL | Status: DC
  Administered 2019-10-28 – 2019-11-04 (×8): 1000 mL

## 2019-10-28 MED ORDER — PANTOPRAZOLE SODIUM 40 MG PO PACK
40.0000 mg | PACK | Freq: Every day | ORAL | Status: DC
  Administered 2019-10-28 – 2019-11-03 (×7): 40 mg
  Filled 2019-10-28 (×9): qty 20

## 2019-10-28 MED ORDER — INSULIN ASPART 100 UNIT/ML ~~LOC~~ SOLN
3.0000 [IU] | SUBCUTANEOUS | Status: DC
  Administered 2019-10-28 (×2): 3 [IU] via SUBCUTANEOUS
  Administered 2019-10-28 – 2019-10-29 (×7): 6 [IU] via SUBCUTANEOUS
  Administered 2019-10-30: 9 [IU] via SUBCUTANEOUS
  Administered 2019-10-30 – 2019-11-01 (×12): 6 [IU] via SUBCUTANEOUS
  Administered 2019-11-01: 3 [IU] via SUBCUTANEOUS
  Administered 2019-11-01 (×3): 6 [IU] via SUBCUTANEOUS
  Administered 2019-11-02 (×2): 9 [IU] via SUBCUTANEOUS
  Administered 2019-11-02: 6 [IU] via SUBCUTANEOUS

## 2019-10-28 MED ORDER — NOREPINEPHRINE 4 MG/250ML-% IV SOLN
INTRAVENOUS | Status: AC
  Filled 2019-10-28: qty 250

## 2019-10-28 MED ORDER — CHLORHEXIDINE GLUCONATE CLOTH 2 % EX PADS
6.0000 | MEDICATED_PAD | Freq: Every day | CUTANEOUS | Status: DC
  Administered 2019-10-28 – 2019-11-03 (×6): 6 via TOPICAL

## 2019-10-28 MED ORDER — INFLUENZA VAC A&B SA ADJ QUAD 0.5 ML IM PRSY
0.5000 mL | PREFILLED_SYRINGE | INTRAMUSCULAR | Status: DC
  Filled 2019-10-28: qty 0.5

## 2019-10-28 MED ORDER — HEPARIN SODIUM (PORCINE) 5000 UNIT/ML IJ SOLN
5000.0000 [IU] | Freq: Three times a day (TID) | INTRAMUSCULAR | Status: DC
  Administered 2019-10-28 – 2019-11-03 (×20): 5000 [IU] via SUBCUTANEOUS
  Filled 2019-10-28 (×19): qty 1

## 2019-10-28 MED ORDER — ARFORMOTEROL TARTRATE 15 MCG/2ML IN NEBU
15.0000 ug | INHALATION_SOLUTION | Freq: Two times a day (BID) | RESPIRATORY_TRACT | Status: DC
  Administered 2019-10-28 – 2019-11-03 (×12): 15 ug via RESPIRATORY_TRACT
  Filled 2019-10-28 (×14): qty 2

## 2019-10-28 MED ORDER — CHLORHEXIDINE GLUCONATE 0.12% ORAL RINSE (MEDLINE KIT)
15.0000 mL | Freq: Two times a day (BID) | OROMUCOSAL | Status: DC
  Administered 2019-10-28 – 2019-11-03 (×14): 15 mL via OROMUCOSAL

## 2019-10-28 NOTE — Progress Notes (Signed)
Spoke with patient's sister, Anette Guarneri, today to update her on her sister's status in the ICU.  Additionally, we reached out to [place] [phone]) to update them on their resident. We discussed if she had been on any medications that could reduce her respiratory rate such as benzodiazepines or opioid agents. They deny benzodiazapine use. They state that the only pain medication she  Takes is tramadol, with her last dose being on 10/26/19 at 9:42 AM.   Maudie Mercury, MD IMTS, PGY-1 Pager: [phone] 11/05/2019,1:09 PM

## 2019-10-28 NOTE — H&P (Signed)
PULMONARY / CRITICAL CARE MEDICINE   NAME:  Heather Zimmerman, MRN:  [id number], DOB:  January 07, 1946, LOS: 0 ADMISSION DATE:  10/25/2019, CONSULTATION DATE:  10/27/2019 REFERRING MD:  ED, CHIEF COMPLAINT:  Hypercapnic respiratory failure  BRIEF HISTORY:    Hypercapnic Respiratory Failure in a 74 y/o nursing home pt with similar previous episode 1 year ago.  HISTORY OF PRESENT ILLNESS   Patient is a 74 year old female nursing home patient morbidly obese with a BMI of greater than 48 history of COPD obstructive sleep apnea on BiPAP at night with peripheral vascular disease and history of right BKA in 2013 presents to the emergency room with a pH of 7.008 PCO2 of greater than 120 bicarb of 40.  She was disoriented with progressive deterioration in her respiratory status and mental status requiring intubation despite initial ventilation with BiPAP.Marland Kitchen  Chest x-ray suggests some pulmonary edema.  Last ejection fraction was 60% in 2019 suggesting possible diastolic dysfunction.  BNP is 153. SIGNIFICANT PAST MEDICAL HISTORY    . Acute respiratory failure ([place]) 03/2018  . Chronic diastolic heart failure ([place]) 12/31/2012  . Chronic pain 12/31/2012  . CKD (chronic kidney disease), stage III ([place])   . Diabetes mellitus without complication ([place])    TYPE 2  . Essential hypertension, benign 12/31/2012  . GERD 12/31/2012  . Obstructive sleep apnea 07/05/2014  . Shortness of breath dyspnea   . 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 Unspecified vitamin D deficiency 12/31/2012  . Venous insufficiency (chronic) (peripheral)   . Venous stasis ulcers ([place])      SIGNIFICANT EVENTS:  10/29/2019 ICU Admission STUDIES:   DG Port CXR:  FINDINGS: There is cardiomegaly. Aortic knob calcifications. Pulmonary vascular congestion and diffuse interstitial markings throughout both lungs. ET tube is 1.7 cm above the carina. NG tube is  seen below the diaphragm. No pneumothorax. No acute osseous abnormality. CULTURES:  11/06/2019: Blood cultures NGTD 1/14: RVP Negative  1/14: Respiratory Culture  ANTIBIOTICS:  N/A  LINES/TUBES:   1/14 Peripheral IV L Forearm and R antecubital  1/14: NG Tube 1/14: 7.5 mm Airway  CONSULTANTS:  PCCM SUBJECTIVE:  Patient Intubated.   CONSTITUTIONAL: BP (!) 163/89   Pulse 73   Temp (!) 97.5 F (36.4 C) (Axillary)   Resp 18   Ht 5\' 4"  (1.626 m)   Wt 129.4 kg   LMP  (LMP Unknown)   SpO2 97%   BMI 48.97 kg/m   I/O last 3 completed shifts: In: 68 [I.V.:20; IV Piggyback:50] Out: -      Vent Mode: PRVC FiO2 (%):  [50 %-100 %] 50 % Set Rate:  [15 bmp-18 bmp] 18 bmp Vt Set:  [420 mL-460 mL] 460 mL PEEP:  [5 cmH20-10 cmH20] 5 cmH20 Plateau Pressure:  [24 cmH20-25 cmH20] 24 cmH20  PHYSICAL EXAM: General:  intubated Neuro:  Pupils reactive to light HEENT:  Normocephalic atraumatic Cardiovascular: RRR, no murmurs, rubs, or gallops Lungs:  Bibasilar crackles appreciated, no wheezes or rhonchi Abdomen:  Distended due to habitus, BS + Musculoskeletal:  L BKA Skin:  Dry and warm  RESOLVED PROBLEM LIST   ASSESSMENT AND PLAN    Hypercapnic Respiratory Failure:  Patient with OSA, COPD, Diastolic Heart Failure: - Blood gas readings improving: pH 7.184/CO2 102/ Bicarbinate 36.5 from 7.008/CO2 >120/Bicarbinate of 40.3. Appears that her CO2 normal level is in the 50-60 range from previous hospitalization a year ago.  - Patient intubated, continue to  manage vent setting and trend vitals.  - Continue with Fentanyl and Propofol for analgesic/anesthetics   - Continue to trend ABG - CXR clear - RVP clear - legionella pending - strep pneumoniae pending - Procalcitonin: 0.1 - LA: 1.0   CKD Stage III:  - Cr increased from 2.47 from 1.74 from last admission - Likely secondary to hypovolemia  - Completed 1L LR bolus - Continue NaCl 0.9% infusion  - Will continue to trend BMP and  correcting electrolyte abnormalities.  - Avoid nephrotoxic drugs.   DM:  - Continue CBG Checks - Continue Resistant SSI   COPD:  - Continue Proventil, Brovana, Pulmicort  OSA:  - Patient with home BiPAP use. Initially started on BiPAP in the ED, but decompensated and needed intubation.  - Patient currently intubated.   GERD:  - Continue Protonix.    SUMMARY OF TODAY'S PLAN:  Continue to monitor patient's respiratory failure by trending ABGs, and monitoring patients vitals.   Best Practice / Goals of Care / Disposition.   DVT PROPHYLAXIS: Heparin  [id number]  NUTRITION:NPO MOBILITY:Bedrest GOALS OF CARE:Full FAMILY DISCUSSIONS: See Separate Progress note.   DISPOSITION ICU  LABS  Glucose Recent Labs  Lab 10/16/2019 0810  GLUCAP 149*    BMET Recent Labs  Lab 10/21/2019 0301  NA 142  K 5.1  CL 96*  CO2 33*  BUN 30*  CREATININE 2.47*  GLUCOSE 155*    Liver Enzymes No results for input(s): AST, ALT, ALKPHOS, BILITOT, ALBUMIN in the last 168 hours.  Electrolytes Recent Labs  Lab 10/24/2019 0301 10/17/2019 0642  CALCIUM 8.5*  --   MG  --  2.0    CBC Recent Labs  Lab 10/17/2019 0301  WBC 5.8  HGB 15.4*  HCT 60.0*  PLT 117*    ABG Recent Labs  Lab 10/22/2019 0425 10/29/2019 0645  PHART 7.008* 7.184*  PCO2ART >120* 102*  PO2ART 138* 193*    Coag's No results for input(s): APTT, INR in the last 168 hours.  Sepsis Markers Recent Labs  Lab 11/08/2019 0408 10/15/2019 0642  LATICACIDVEN 1.0  --   PROCALCITON  --  <0.10    Cardiac Enzymes No results for input(s): TROPONINI, PROBNP in the last 168 hours.  PAST MEDICAL HISTORY :   She  has a past medical history of Acute respiratory failure ([place]) (03/2018), Chronic diastolic heart failure ([place]) (12/31/2012), Chronic pain (12/31/2012), CKD (chronic kidney disease), stage III ([place]), Diabetes mellitus without complication ([place]), Essential hypertension, benign (12/31/2012), GERD (12/31/2012), Obstructive sleep  apnea (07/05/2014), Shortness of breath dyspnea, Type II or unspecified type diabetes mellitus with peripheral circulatory disorders, not stated as uncontrolled(250.70) (12/31/2012), Unspecified vitamin D deficiency (12/31/2012), Venous insufficiency (chronic) (peripheral), and Venous stasis ulcers ([place]).  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  Allergies  Allergen Reactions  . Ace Inhibitors Other (See Comments)    Unknown - per MAR    No current facility-administered medications on file prior to encounter.   Current Outpatient Medications on File Prior to Encounter  Medication Sig  . amLODipine (NORVASC) 5 MG tablet Take 5 mg by mouth daily.  Marland Kitchen aspirin EC 81 MG tablet Take 81 mg by mouth daily.  . Cholecalciferol (VITAMIN D) 50 MCG (2000 UT) tablet Take 2,000 Units by mouth daily.  Marland Kitchen escitalopram (LEXAPRO) 5 MG tablet Take 7.5 mg by mouth daily.   . ferrous sulfate 325 (65  FE) MG tablet Take 325 mg by mouth daily with breakfast.  . Fluticasone-Salmeterol (ADVAIR) 250-50 MCG/DOSE AEPB Inhale 1 puff into the lungs every 12 (twelve) hours. Reported on 04/25/2016  . glucagon (GLUCAGON EMERGENCY) 1 MG injection Inject 1 mg into the vein once as needed (hypoglycemia).  Marland Kitchen guaiFENesin (MUCINEX) 600 MG 12 hr tablet Take 600 mg by mouth 2 (two) times daily.  . insulin glargine (LANTUS) 100 UNIT/ML injection Inject 0-20 Units into the skin at bedtime. 0-180: 0 units; 181-400: 20 units  . insulin lispro (HUMALOG) 100 UNIT/ML injection Inject 0-10 Units into the skin See admin instructions. Twice daily per sliding scale; 0-150, 0 units; 151-200, 2 units; 201-250, 4 units;  251-300; 6 units; 301-350, 8 units; 351-400, 10 units; call PCP if <70 or >400  . montelukast (SINGULAIR) 10 MG tablet Take 10 mg by mouth at bedtime.  Marland Kitchen omeprazole (PRILOSEC) 20 MG capsule Take 20 mg by mouth daily.  . OXYGEN Inhale 2  L/min into the lungs continuous. via nasal cannula  . sennosides-docusate sodium (SENOKOT-S) 8.6-50 MG tablet Take 2 tablets by mouth at bedtime.  . simvastatin (ZOCOR) 5 MG tablet Take 5 mg by mouth at bedtime.  Marland Kitchen tiotropium (SPIRIVA) 18 MCG inhalation capsule Place 18 mcg into inhaler and inhale daily.  Marland Kitchen torsemide (DEMADEX) 20 MG tablet Take 1 tablet (20 mg total) by mouth every other day. (Patient taking differently: Take 20 mg by mouth daily. )  . TRULICITY 1.5 0000000 SOPN Inject 1.5 mg into the skin every Thursday.  . vitamin C (ASCORBIC ACID) 500 MG tablet Take 500 mg by mouth 2 (two) times daily.  Marland Kitchen acetaminophen (TYLENOL) 325 MG tablet Take 2 tablets (650 mg total) by mouth every 6 (six) hours as needed for mild pain (or Fever >/= 101).  . gabapentin (NEURONTIN) 100 MG capsule Take 100 mg by mouth 2 (two) times daily.  . hydrocerin (EUCERIN) CREA Apply 1 application topically daily. (Patient not taking: Reported on 11/07/2019)  . ipratropium-albuterol (DUONEB) 0.5-2.5 (3) MG/3ML SOLN Take 3 mLs by nebulization every 6 (six) hours as needed (shortness of breath).  Marland Kitchen levofloxacin (LEVAQUIN) 250 MG tablet Take 1 tablet (250 mg total) by mouth daily. (Patient not taking: Reported on 11/08/2019)    FAMILY HISTORY:   Her family history includes Hypertension in an other family member.  SOCIAL HISTORY:  She  reports that she has never smoked. She has never used smokeless tobacco. She reports that she does not drink alcohol or use drugs.  REVIEW OF SYSTEMS:    Intubated

## 2019-10-28 NOTE — Procedures (Signed)
Central Venous Catheter Insertion Procedure Note Heather Zimmerman [id number] 02-13-1946  Procedure: Insertion of Central Venous Catheter Indications: Assessment of intravascular volume, Drug and/or fluid administration and Frequent blood sampling  Procedure Details Consent: Risks of procedure as well as the alternatives and risks of each were explained to the (patient/caregiver).  Consent for procedure obtained. Time Out: Verified patient identification, verified procedure, site/side was marked, verified correct patient position, special equipment/implants available, medications/allergies/relevent history reviewed, required imaging and test results available.  Performed  Maximum sterile technique was used including antiseptics, cap, gloves, gown, hand hygiene, mask and sheet. Skin prep: Chlorhexidine; local anesthetic administered A antimicrobial bonded/coated triple lumen catheter was placed in the left internal jugular vein using the Seldinger technique. Ultrasound guidance used.Yes.   Catheter placed to 20 cm. Blood aspirated via all 3 ports and then flushed x 3. Line sutured x 2 and dressing applied.  Evaluation Blood flow good Complications: No apparent complications Patient did tolerate procedure well. Chest X-ray ordered to verify placement.  CXR: pending.  Heather Zimmerman Heather Zimmerman ACNP Acute Care Nurse Practitioner [place] Please consult Amion 11/09/2019, 11:37 AM

## 2019-10-28 NOTE — Sepsis Progress Note (Addendum)
Secure chat with Dr Gilford Rile regarding sepsis bundle ordered in ED and no antibiotics ordered. Normal lactic acid. He is holding off on antibiotics for now. See MD note.

## 2019-10-28 NOTE — Sepsis Progress Note (Signed)
Attempted to notify Hayden Pedro NP vis secure chatof need to order antibiotics.

## 2019-10-28 NOTE — Progress Notes (Signed)
ABG obtained on patient at [facility] and ran on ISTAT. PCO2 was out of reportable range so ABG send down to lab at 0435.

## 2019-10-28 NOTE — Code Documentation (Signed)
Given 20 mg of Etomidate and 100 of Rocuronium

## 2019-10-28 NOTE — Progress Notes (Addendum)
Initial Nutrition Assessment  DOCUMENTATION CODES:   Morbid obesity  INTERVENTION:    Vital High Protein at 30 ml/h (720 ml per day)   Pro-stat 30 ml QID   Provides 1120 kcal (1672 kcal total with propofol), 123 gm protein, 602 ml free water daily  NUTRITION DIAGNOSIS:   Inadequate oral intake related to inability to eat as evidenced by NPO status.  GOAL:   Provide needs based on ASPEN/SCCM guidelines  MONITOR:   TF tolerance, Skin, Vent status, Labs  REASON FOR ASSESSMENT:   Ventilator, Consult Enteral/tube feeding initiation and management  ASSESSMENT:   74 yo female admitted with respiratory distress, obtundation, requiring intubation. PMH includes R AKA, morbid obesity, COPD, baseline hypercapnia on BiPAP nightly, DM-2, HF, HTN, vitamin D deficiency, venous stasis ulcers, OSA, CKD.   Received MD Consult for TF initiation and management.  Patient is currently intubated on ventilator support MV: 7 L/min Temp (24hrs), Avg:98.5 F (36.9 C), Min:97.5 F (36.4 C), Max:99.6 F (37.6 C)  Propofol: 20.9 ml/hr providing 552 kcal from lipid  Labs reviewed. Potassium 5.1 (WNL), BUN 30 (H), creatinine 2.47 (H) CBG's: 149  Medications reviewed and include novolog, propofol, levophed.   NUTRITION - FOCUSED PHYSICAL EXAM:  unable to complete  Diet Order:   Diet Order            Diet NPO time specified  Diet effective now              EDUCATION NEEDS:   Not appropriate for education at this time  Skin:  Skin Assessment: Skin Integrity Issues: Skin Integrity Issues:: Other (Comment) Other: L leg arterial ulcer  Last BM:  PTA  Height:   Ht Readings from Last 1 Encounters:  10/21/2019 5\' 4"  (1.626 m)    Weight:   Wt Readings from Last 1 Encounters:  11/09/2019 129.4 kg    Ideal Body Weight:  50.1 kg  BMI:  53 (adjusted for AKA)  Estimated Nutritional Needs:   Kcal:  1100-1250  Protein:  115-125 gm  Fluid:  >/= 1.5 L    Molli Barrows, RD, LDN, [place] Pager [phone] After Hours Pager [phone]

## 2019-10-28 NOTE — Procedures (Signed)
Arterial Catheter Insertion Procedure Note Heather Zimmerman [id number] Oct 29, 1945  Procedure: Insertion of Arterial Catheter  Indications: Blood pressure monitoring  Procedure Details Consent: Risks of procedure as well as the alternatives and risks of each were explained to the (patient/caregiver).  Consent for procedure obtained. Time Out: Verified patient identification, verified procedure, site/side was marked, verified correct patient position, special equipment/implants available, medications/allergies/relevent history reviewed, required imaging and test results available.  Performed  Maximum sterile technique was used including antiseptics, cap, gloves, hand hygiene, mask and sheet. Skin prep: Chlorhexidine; local anesthetic administered 20 gauge catheter was inserted into right radial artery using the Seldinger technique. ULTRASOUND GUIDANCE USED: YES Evaluation Blood flow good; BP tracing good. Complications: No apparent complications.   Candee Furbish 10/21/2019
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
## 2019-10-28 NOTE — Progress Notes (Signed)
Ventilator pt transported by RT & RN from ED19 to 2M03 without any complications.

## 2019-10-28 NOTE — ED Triage Notes (Signed)
Came in with [facility], from [facility], Reportedly desatting in the 80's despite breating treatments at the faclity, currently  Tachypneic. Unable to respond to question, looked disorientated at this time but reported baseline is A/Ox4. Current temp at 98.5.

## 2019-10-28 NOTE — Code Documentation (Signed)
Current Levophed drip at 18.8 mL/hr

## 2019-10-29 ENCOUNTER — Inpatient Hospital Stay (HOSPITAL_COMMUNITY): Payer: Medicare (Managed Care)

## 2019-10-29 DIAGNOSIS — J9602 Acute respiratory failure with hypercapnia: Secondary | ICD-10-CM

## 2019-10-29 LAB — GLUCOSE, CAPILLARY
Glucose-Capillary: 160 mg/dL — ABNORMAL HIGH (ref 70–99)
Glucose-Capillary: 162 mg/dL — ABNORMAL HIGH (ref 70–99)
Glucose-Capillary: 195 mg/dL — ABNORMAL HIGH (ref 70–99)
Glucose-Capillary: 182 mg/dL — ABNORMAL HIGH (ref 70–99)
Glucose-Capillary: 194 mg/dL — ABNORMAL HIGH (ref 70–99)
Glucose-Capillary: 170 mg/dL — ABNORMAL HIGH (ref 70–99)

## 2019-10-29 LAB — CBC
HCT: 44.1 % (ref 36.0–46.0)
Hemoglobin: 12.6 g/dL (ref 12.0–15.0)
MCH: 27.8 pg (ref 26.0–34.0)
MCHC: 28.6 g/dL — ABNORMAL LOW (ref 30.0–36.0)
MCV: 97.1 fL (ref 80.0–100.0)
Platelets: 97 10*3/uL — ABNORMAL LOW (ref 150–400)
RBC: 4.54 MIL/uL (ref 3.87–5.11)
RDW: 17.5 % — ABNORMAL HIGH (ref 11.5–15.5)
WBC: 3.5 10*3/uL — ABNORMAL LOW (ref 4.0–10.5)
nRBC: 0 % (ref 0.0–0.2)

## 2019-10-29 LAB — BASIC METABOLIC PANEL
Anion gap: 8 (ref 5–15)
BUN: 46 mg/dL — ABNORMAL HIGH (ref 8–23)
CO2: 36 mmol/L — ABNORMAL HIGH (ref 22–32)
Calcium: 8 mg/dL — ABNORMAL LOW (ref 8.9–10.3)
Chloride: 97 mmol/L — ABNORMAL LOW (ref 98–111)
Creatinine, Ser: 2.21 mg/dL — ABNORMAL HIGH (ref 0.44–1.00)
GFR calc Af Amer: 25 mL/min — ABNORMAL LOW (ref >60)
GFR calc non Af Amer: 21 mL/min — ABNORMAL LOW (ref >60)
Glucose, Bld: 178 mg/dL — ABNORMAL HIGH (ref 70–99)
Potassium: 4.1 mmol/L (ref 3.5–5.1)
Sodium: 141 mmol/L (ref 135–145)

## 2019-10-29 LAB — POCT I-STAT 7, (LYTES, BLD GAS, ICA,H+H)
Acid-Base Excess: 16 mmol/L — ABNORMAL HIGH (ref 0.0–2.0)
Bicarbonate: 42.4 mmol/L — ABNORMAL HIGH (ref 20.0–28.0)
Calcium, Ion: 1.11 mmol/L — ABNORMAL LOW (ref 1.15–1.40)
HCT: 41 % (ref 36.0–46.0)
Hemoglobin: 13.9 g/dL (ref 12.0–15.0)
O2 Saturation: 89 %
Patient temperature: 98.6
Potassium: 4.2 mmol/L (ref 3.5–5.1)
Sodium: 141 mmol/L (ref 135–145)
TCO2: 44 mmol/L — ABNORMAL HIGH (ref 22–32)
pCO2 arterial: 53.5 mmHg — ABNORMAL HIGH (ref 32.0–48.0)
pH, Arterial: 7.506 — ABNORMAL HIGH (ref 7.350–7.450)
pO2, Arterial: 52 mmHg — ABNORMAL LOW (ref 83.0–108.0)

## 2019-10-29 LAB — PHOSPHORUS: Phosphorus: 1.8 mg/dL — ABNORMAL LOW (ref 2.5–4.6)

## 2019-10-29 LAB — PROCALCITONIN: Procalcitonin: 0.18 ng/mL

## 2019-10-29 LAB — LEGIONELLA PNEUMOPHILA SEROGP 1 UR AG: L. pneumophila Serogp 1 Ur Ag: NEGATIVE

## 2019-10-29 LAB — MAGNESIUM: Magnesium: 1.9 mg/dL (ref 1.7–2.4)

## 2019-10-29 LAB — TRIGLYCERIDES: Triglycerides: 94 mg/dL (ref <150)

## 2019-10-29 MED ORDER — K PHOS MONO-SOD PHOS DI & MONO 155-852-130 MG PO TABS
500.0000 mg | ORAL_TABLET | Freq: Three times a day (TID) | ORAL | Status: AC
  Administered 2019-10-29 (×3): 500 mg via ORAL
  Filled 2019-10-29 (×3): qty 2

## 2019-10-29 MED ORDER — SODIUM CHLORIDE 0.9 % IV SOLN
2.0000 g | Freq: Two times a day (BID) | INTRAVENOUS | Status: DC
  Administered 2019-10-29 – 2019-11-02 (×9): 2 g via INTRAVENOUS
  Filled 2019-10-29 (×9): qty 2

## 2019-10-29 MED ORDER — POTASSIUM PHOSPHATE MONOBASIC 500 MG PO TABS
500.0000 mg | ORAL_TABLET | Freq: Three times a day (TID) | ORAL | Status: DC
  Filled 2019-10-29 (×2): qty 1

## 2019-10-29 MED ORDER — ACETAMINOPHEN 160 MG/5ML PO SOLN
325.0000 mg | Freq: Four times a day (QID) | ORAL | Status: DC | PRN
  Administered 2019-10-29 – 2019-11-02 (×10): 325 mg via ORAL
  Filled 2019-10-29 (×10): qty 20.3

## 2019-10-29 MED ORDER — VANCOMYCIN HCL 1250 MG/250ML IV SOLN
1250.0000 mg | INTRAVENOUS | Status: DC
  Filled 2019-10-29: qty 250

## 2019-10-29 MED ORDER — MAGNESIUM SULFATE 2 GM/50ML IV SOLN
2.0000 g | Freq: Once | INTRAVENOUS | Status: AC
  Administered 2019-10-29: 2 g via INTRAVENOUS
  Filled 2019-10-29: qty 50

## 2019-10-29 MED ORDER — VANCOMYCIN HCL 10 G IV SOLR
2500.0000 mg | Freq: Once | INTRAVENOUS | Status: AC
  Administered 2019-10-29: 2500 mg via INTRAVENOUS
  Filled 2019-10-29: qty 2500

## 2019-10-29 NOTE — Progress Notes (Signed)
Pharmacy Antibiotic Note  Heather Zimmerman is a 74 y.o. female admitted from nursing home on 10/17/2019 with tachypnea and SOB.  CXR shows possible PNA, now with fever of 102.3 and trach secretions is brownish.  Pharmacy has been consulted for vancomycin and cefepime dosing while awaiting culture data.  Patient has CKD3 with elevated SCr on admission.  SCr is improving slowly.  WBC WNL, LA 1, PCT 0.18.  Plan: Vanc 2500mg  IV x 1, then 1250mg  IV Q48H for AUC 503 using SCr 2.21 Cefepime 2gm IV Q12H since SCr is on the downward trend Monitor renal fxn, clinical progress, vanc AUC as indicated  Height: 5\' 4"  (162.6 cm) Weight: 286 lb 9.6 oz (130 kg) IBW/kg (Calculated) : 54.7  Temp (24hrs), Avg:99.4 F (37.4 C), Min:97.7 F (36.5 C), Max:102.3 F (39.1 C)  Recent Labs  Lab 11/08/2019 0301 10/16/2019 0408 10/18/2019 1701 10/29/19 0548  WBC 5.8  --   --   --   CREATININE 2.47*  --  2.56* 2.21*  LATICACIDVEN  --  1.0  --   --     Estimated Creatinine Clearance: 29.9 mL/min (A) (by C-G formula based on SCr of 2.21 mg/dL (H)).    Allergies  Allergen Reactions  . Ace Inhibitors Other (See Comments)    Unknown - per [facility]    Vanc 1/15 >> Cefepime 1/15 >>  1/14 covid / flu - negative 1/14 MRSA PCR - positive 1/14 Strep pneumo Ur Ag - negative 1/14 resp panel PCR - negative 1/14 TA - 1/14 UCx -  1/14 BCx -   Mikala Podoll D. Mina Marble, PharmD, BCPS, [place] 10/29/2019, 8:59 AM

## 2019-10-29 NOTE — Progress Notes (Signed)
Spoke with Anette Guarneri this afternoon to discuss her sister's condition. We spoke about her AM labs and her fever overnight. We discussed starting her on antibiotics while awaiting cultures. We attempted to discuss [place], but Ms. Caprice Beaver stated that she needed to discuss with her family before coming to any conclusions on her sister's Heather Zimmerman. Maudie Mercury, MD IMTS, PGY-1

## 2019-10-29 NOTE — Progress Notes (Addendum)
PULMONARY / CRITICAL CARE MEDICINE   NAME:  Heather Zimmerman, MRN:  [id number], DOB:  07-20-1946, LOS: 1 ADMISSION DATE:  10/26/2019, CONSULTATION DATE:  10/22/2019 REFERRING MD:  ED, CHIEF COMPLAINT:  Hypercapnic respiratory failure  BRIEF HISTORY:    Hypercapnic Respiratory Failure in a 74 y/o nursing home pt with similar previous episode 1 year ago.  HISTORY OF PRESENT ILLNESS   Patient is a 74 year old female nursing home patient morbidly obese with a BMI of greater than 48 history of COPD obstructive sleep apnea on BiPAP at night with peripheral vascular disease and history of right BKA in 2013 presents to the emergency room with a pH of 7.008 PCO2 of greater than 120 bicarb of 40.  She was disoriented with progressive deterioration in her respiratory status and mental status requiring intubation despite initial ventilation with BiPAP.  Chest x-ray suggests some pulmonary edema.  Last ejection fraction was 60% in 2019 suggesting possible diastolic dysfunction.  BNP is 153. SIGNIFICANT PAST MEDICAL HISTORY    . Acute respiratory failure ([place]) 03/2018  . Chronic diastolic heart failure ([place]) 12/31/2012  . Chronic pain 12/31/2012  . CKD (chronic kidney disease), stage III ([place])   . Diabetes mellitus without complication ([place])    TYPE 2  . Essential hypertension, benign 12/31/2012  . GERD 12/31/2012  . Obstructive sleep apnea 07/05/2014  . Shortness of breath dyspnea   . 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 Unspecified vitamin D deficiency 12/31/2012  . Venous insufficiency (chronic) (peripheral)   . Venous stasis ulcers ([place])      SIGNIFICANT EVENTS:  10/17/2019 ICU Admission STUDIES:   DG Port CXR:  FINDINGS: There is cardiomegaly. Aortic knob calcifications. Pulmonary vascular congestion and diffuse interstitial markings throughout both lungs. ET tube is 1.7 cm above the carina. NG tube is  seen below the diaphragm. No pneumothorax. No acute osseous abnormality. CULTURES:  10/15/2019: Blood cultures NGTD 1/14: RVP Negative  1/14: Respiratory Culture negative 1/14: MRSA PCR: Positive 1/14: Respiratory culture: pending   ANTIBIOTICS:  N/A  LINES/TUBES:   1/14 Peripheral IV L Forearm and R antecubital  1/14: NG Tube 1/14: 7.5 mm Airway  1/14: LIJ 1/14: R Arterial Line CONSULTANTS:  PCCM SUBJECTIVE:  O/N: No changes O/N.  S: Patient intubated.   CONSTITUTIONAL: BP (!) 146/75 (BP Location: Left Arm)   Pulse 78   Temp 100.1 F (37.8 C) (Oral)   Resp 18   Ht 5\' 4"  (1.626 m)   Wt 130 kg   LMP  (LMP Unknown)   SpO2 93%   BMI 49.19 kg/m   I/O last 3 completed shifts: In: 2924.6 [I.V.:417.4; Other:30; NG/GT:433.5; IV Piggyback:2043.6] Out: 630 [Urine:630]     Vent Mode: PRVC FiO2 (%):  [50 %-60 %] 60 % Set Rate:  [16 bmp-18 bmp] 18 bmp Vt Set:  [430 mL-460 mL] 430 mL PEEP:  [5 cmH20-8 cmH20] 8 cmH20 Plateau Pressure:  [22 cmH20-26 cmH20] 25 cmH20  PHYSICAL EXAM: General:  intubated Neuro:  Pupils reactive to light HEENT:  Normocephalic, atraumatic, intubated.  Cardiovascular: RRR, no murmurs, rubs, or gallops Lungs:  Bibasilar crackles appreciated bilaterally, no wheezes or rhonchi Abdomen:  Distended due to habitus, BS + Musculoskeletal:  L BKA Skin:  Dry and warm  RESOLVED PROBLEM LIST   ASSESSMENT AND PLAN    Hypercapnic Respiratory Failure:  Patient with OSA, COPD, Diastolic Heart Failure currently unclear as to underlying etiology: - Blood  gas readings: pH 7.506/CO2 53/Bicarb 42.4 from  pH 7.184/CO2 102/ Bicarbinate 36.5. Patient has been overcorrected, will change vent settings appropriately.  -Appears that her CO2 normal level is in the 50-60 range from previous hospitalization a year ago.  - Patient intubated, continue to manage vent setting and trend vitals.  - Continue with Fentanyl and Propofol for analgesic/anesthetics as needed.  -  Continue to trend ABG - CXR clear - RVP clear - UDS: negative - legionella pending - strep pneumoniae: negative   Hypotension:  Patient with low MAP scores (40s) upon arrival. She was started on levophed with improvement.  - Continue Levo   Febrile Event:  - Patient with overnight fever, 100.2->102.  - Blood Cultures NGTD - CBC pending - tylenol PRN ordered.    AKI on CKD Stage III:  - Cr increased from 2.21 from 2.56 from last admission - Will continue to treat conservatively  - NaCl 0.9% infusion  - Will continue to trend BMP and correcting electrolyte abnormalities.  - Avoid nephrotoxic drugs.  - Continue   DM:  - Continue CBG Checks - Continue Resistant SSI   COPD:  - Continue Proventil, Brovana, Pulmicort  OSA:  - Patient with home BiPAP use. Initially started on BiPAP in the ED, but decompensated and needed intubation.  - Patient currently intubated.   GERD:  - Continue Protonix.    SUMMARY OF TODAY'S PLAN:  Continue to monitor patient's respiratory failure by trending ABGs, and monitoring patients vitals.   Best Practice / Goals of Care / Disposition.   DVT PROPHYLAXIS: Heparin  AD:[id number]  NUTRITION:NPO MOBILITY:Bedrest GOALS OF CARE:Full FAMILY DISCUSSIONS: See Separate Progress note.   DISPOSITION ICU  LABS  Glucose Recent Labs  Lab 11/08/2019 0810 11/10/2019 1135 11/01/2019 1458 11/08/2019 1916 11/12/2019 2303 10/29/19 0304  GLUCAP 149* 134* 119* 116* 182* 160*    BMET Recent Labs  Lab 11/08/2019 0301 10/26/2019 0301 11/13/2019 1457 11/13/2019 1701 10/29/19 0607  NA 142   < > 141 142 141  K 5.1   < > 4.3 4.3 4.2  CL 96*  --   --  96*  --   CO2 33*  --   --  34*  --   BUN 30*  --   --  37*  --   CREATININE 2.47*  --   --  2.56*  --   GLUCOSE 155*  --   --  142*  --    < > = values in this interval not displayed.    Liver Enzymes No results for input(s): AST, ALT, ALKPHOS, BILITOT, ALBUMIN in the last 168 hours.  Electrolytes Recent Labs   Lab 11/06/2019 0301 10/27/2019 0642 11/05/2019 1701  CALCIUM 8.5*  --  8.0*  MG  --  2.0  --     CBC Recent Labs  Lab 11/14/2019 0301 11/14/2019 1457 10/29/19 0607  WBC 5.8  --   --   HGB 15.4* 14.3 13.9  HCT 60.0* 42.0 41.0  PLT 117*  --   --     ABG Recent Labs  Lab 11/05/2019 0645 10/15/2019 1457 10/29/19 0607  PHART 7.184* 7.417 7.506*  PCO2ART 102* 62.1* 53.5*  PO2ART 193* 51.0* 52.0*    Coag's No results for input(s): APTT, INR in the last 168 hours.  Sepsis Markers Recent Labs  Lab 11/12/2019 0408 11/12/2019 0642  LATICACIDVEN 1.0  --   PROCALCITON  --  <0.10    Cardiac Enzymes No results for input(s): TROPONINI, PROBNP in  the last 168 hours.  PAST MEDICAL HISTORY :   She  has a past medical history of Acute respiratory failure ([place]) (03/2018), Chronic diastolic heart failure ([place]) (12/31/2012), Chronic pain (12/31/2012), CKD (chronic kidney disease), stage III, Diabetes mellitus without complication ([place]), Essential hypertension, benign (12/31/2012), GERD (12/31/2012), Obstructive sleep apnea (07/05/2014), Shortness of breath dyspnea, Type II or unspecified type diabetes mellitus with peripheral circulatory disorders, not stated as uncontrolled(250.70) (12/31/2012), Unspecified vitamin D deficiency (12/31/2012), Venous insufficiency (chronic) (peripheral), and Venous stasis ulcers ([place]).  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  Allergies  Allergen Reactions  . Ace Inhibitors Other (See Comments)    Unknown - per [facility]    No current facility-administered medications on file prior to encounter.   Current Outpatient Medications on File Prior to Encounter  Medication Sig  . amLODipine (NORVASC) 5 MG tablet Take 5 mg by mouth daily.  Marland Kitchen aspirin EC 81 MG tablet Take 81 mg by mouth daily.  . Cholecalciferol (VITAMIN D) 50 MCG (2000 UT) tablet Take 2,000 Units by mouth daily.   Marland Kitchen escitalopram (LEXAPRO) 5 MG tablet Take 7.5 mg by mouth daily.   . ferrous sulfate 325 (65 FE) MG tablet Take 325 mg by mouth daily with breakfast.  . Fluticasone-Salmeterol (ADVAIR) 250-50 MCG/DOSE AEPB Inhale 1 puff into the lungs every 12 (twelve) hours. Reported on 04/25/2016  . glucagon (GLUCAGON EMERGENCY) 1 MG injection Inject 1 mg into the vein once as needed (hypoglycemia).  Marland Kitchen guaiFENesin (MUCINEX) 600 MG 12 hr tablet Take 600 mg by mouth 2 (two) times daily.  . insulin glargine (LANTUS) 100 UNIT/ML injection Inject 0-20 Units into the skin at bedtime. 0-180: 0 units; 181-400: 20 units  . insulin lispro (HUMALOG) 100 UNIT/ML injection Inject 0-10 Units into the skin See admin instructions. Twice daily per sliding scale; 0-150, 0 units; 151-200, 2 units; 201-250, 4 units;  251-300; 6 units; 301-350, 8 units; 351-400, 10 units; call PCP if <70 or >400  . montelukast (SINGULAIR) 10 MG tablet Take 10 mg by mouth at bedtime.  Marland Kitchen omeprazole (PRILOSEC) 20 MG capsule Take 20 mg by mouth daily.  . OXYGEN Inhale 2 L/min into the lungs continuous. via nasal cannula  . sennosides-docusate sodium (SENOKOT-S) 8.6-50 MG tablet Take 2 tablets by mouth at bedtime.  . simvastatin (ZOCOR) 5 MG tablet Take 5 mg by mouth at bedtime.  Marland Kitchen tiotropium (SPIRIVA) 18 MCG inhalation capsule Place 18 mcg into inhaler and inhale daily.  Marland Kitchen torsemide (DEMADEX) 20 MG tablet Take 1 tablet (20 mg total) by mouth every other day. (Patient taking differently: Take 20 mg by mouth daily. )  . TRULICITY 1.5 0000000 SOPN Inject 1.5 mg into the skin every Thursday.  . vitamin C (ASCORBIC ACID) 500 MG tablet Take 500 mg by mouth 2 (two) times daily.  Marland Kitchen acetaminophen (TYLENOL) 325 MG tablet Take 2 tablets (650 mg total) by mouth every 6 (six) hours as needed for mild pain (or Fever >/= 101).  . gabapentin (NEURONTIN) 100 MG capsule Take 100 mg by mouth 2 (two) times daily.  . hydrocerin (EUCERIN) CREA Apply 1 application topically  daily. (Patient not taking: Reported on 11/13/2019)  . ipratropium-albuterol (DUONEB) 0.5-2.5 (3) MG/3ML SOLN Take 3 mLs by nebulization every 6 (six) hours as needed (shortness of breath).  Marland Kitchen levofloxacin (LEVAQUIN) 250 MG tablet Take 1 tablet (250 mg total) by mouth daily. (  Patient not taking: Reported on 11/03/2019)    FAMILY HISTORY:   Her family history includes Hypertension in an other family member.  SOCIAL HISTORY:  She  reports that she has never smoked. She has never used smokeless tobacco. She reports that she does not drink alcohol or use drugs.  REVIEW OF SYSTEMS:    Intubated

## 2019-10-30 ENCOUNTER — Inpatient Hospital Stay (HOSPITAL_COMMUNITY): Payer: Medicare (Managed Care)

## 2019-10-30 DIAGNOSIS — R609 Edema, unspecified: Secondary | ICD-10-CM

## 2019-10-30 LAB — BASIC METABOLIC PANEL
Anion gap: 9 (ref 5–15)
BUN: 51 mg/dL — ABNORMAL HIGH (ref 8–23)
CO2: 34 mmol/L — ABNORMAL HIGH (ref 22–32)
Calcium: 7.8 mg/dL — ABNORMAL LOW (ref 8.9–10.3)
Chloride: 102 mmol/L (ref 98–111)
Creatinine, Ser: 1.93 mg/dL — ABNORMAL HIGH (ref 0.44–1.00)
GFR calc Af Amer: 29 mL/min — ABNORMAL LOW (ref >60)
GFR calc non Af Amer: 25 mL/min — ABNORMAL LOW (ref >60)
Glucose, Bld: 191 mg/dL — ABNORMAL HIGH (ref 70–99)
Potassium: 3.7 mmol/L (ref 3.5–5.1)
Sodium: 145 mmol/L (ref 135–145)

## 2019-10-30 LAB — GLUCOSE, CAPILLARY
Glucose-Capillary: 155 mg/dL — ABNORMAL HIGH (ref 70–99)
Glucose-Capillary: 174 mg/dL — ABNORMAL HIGH (ref 70–99)
Glucose-Capillary: 178 mg/dL — ABNORMAL HIGH (ref 70–99)
Glucose-Capillary: 185 mg/dL — ABNORMAL HIGH (ref 70–99)
Glucose-Capillary: 198 mg/dL — ABNORMAL HIGH (ref 70–99)
Glucose-Capillary: 214 mg/dL — ABNORMAL HIGH (ref 70–99)

## 2019-10-30 LAB — PHOSPHORUS: Phosphorus: 3.3 mg/dL (ref 2.5–4.6)

## 2019-10-30 LAB — CBC
HCT: 45 % (ref 36.0–46.0)
Hemoglobin: 12.8 g/dL (ref 12.0–15.0)
MCH: 27.6 pg (ref 26.0–34.0)
MCHC: 28.4 g/dL — ABNORMAL LOW (ref 30.0–36.0)
MCV: 97 fL (ref 80.0–100.0)
Platelets: 85 10*3/uL — ABNORMAL LOW (ref 150–400)
RBC: 4.64 MIL/uL (ref 3.87–5.11)
RDW: 18.2 % — ABNORMAL HIGH (ref 11.5–15.5)
WBC: 2.1 10*3/uL — ABNORMAL LOW (ref 4.0–10.5)
nRBC: 0 % (ref 0.0–0.2)

## 2019-10-30 LAB — PROCALCITONIN: Procalcitonin: 0.86 ng/mL

## 2019-10-30 MED ORDER — ACETAMINOPHEN 160 MG/5ML PO SOLN
650.0000 mg | Freq: Once | ORAL | Status: AC
  Administered 2019-10-30: 650 mg
  Filled 2019-10-30: qty 20.3

## 2019-10-30 MED ORDER — NOREPINEPHRINE 4 MG/250ML-% IV SOLN
0.0000 ug/min | INTRAVENOUS | Status: DC
  Administered 2019-10-30: 2 ug/min via INTRAVENOUS
  Filled 2019-10-30: qty 250

## 2019-10-30 NOTE — Progress Notes (Signed)
Pharmacy Antibiotic Note  Heather Zimmerman is a 74 y.o. female admitted from nursing home on 11/09/2019 with tachypnea and SOB.  CXR shows possible PNA, now with fever of 102.3 and trach secretions is brownish.  Pharmacy has been consulted for vancomycin and cefepime dosing - day #2.  Patient has CKD3 with AKI on admission. SCr trend down to 1.93. Patient was loaded with vancomycin on 1/15.  Plan: Change vancomycin to 1500mg  IV q48h for improving renal function (estimated AUC 508 using SCr 1.93) Cefepime 2gm IV Q12H Monitor renal function, c/s, clinical progress, vanclevels as indicated  Height: 5\' 4"  (162.6 cm) Weight: 286 lb 9.6 oz (130 kg) IBW/kg (Calculated) : 54.7  Temp (24hrs), Avg:101.1 F (38.4 C), Min:99 F (37.2 C), Max:103.3 F (39.6 C)  Recent Labs  Lab 10/27/2019 0301 11/12/2019 0408 11/03/2019 1701 10/29/19 0548 10/29/19 0947 10/30/19 0803  WBC 5.8  --   --   --  3.5* 2.1*  CREATININE 2.47*  --  2.56* 2.21*  --  1.93*  LATICACIDVEN  --  1.0  --   --   --   --     Estimated Creatinine Clearance: 34.2 mL/min (A) (by C-G formula based on SCr of 1.93 mg/dL (H)).    Allergies  Allergen Reactions  . Ace Inhibitors Other (See Comments)    Unknown - per [facility]    Vanc 1/15 >> Cefepime 1/15 >>  1/14 covid / flu - negative 1/14 MRSA PCR - positive 1/14 Strep pneumo Ur Ag - negative 1/14 resp panel PCR - negative 1/14 BCx - ngtd   Elicia Lamp, PharmD, BCPS Please check AMION for all [place] contact numbers Clinical Pharmacist 10/30/2019 9:42 AM

## 2019-10-30 NOTE — Progress Notes (Addendum)
Pt urine output 25/hr, MD notified. Will continue to monitor urine output. Creatinine better today than yesterday. MD also notified of pt having tremors in right hand when you pick her arm up. Will continue to monitor.

## 2019-10-30 NOTE — Plan of Care (Signed)
  Problem: Education: Goal: Knowledge of General Education information will improve Description: Including pain rating scale, medication(s)/side effects and non-pharmacologic comfort measures Outcome: Progressing   Problem: Health Behavior/Discharge Planning: Goal: Ability to manage health-related needs will improve Outcome: Progressing   Problem: Activity: Goal: Risk for activity intolerance will decrease Outcome: Progressing   Problem: Nutrition: Goal: Adequate nutrition will be maintained Outcome: Progressing   Problem: Coping: Goal: Level of anxiety will decrease Outcome: Progressing   Problem: Elimination: Goal: Will not experience complications related to urinary retention Outcome: Progressing   Problem: Pain Managment: Goal: General experience of comfort will improve Outcome: Progressing   Problem: Safety: Goal: Ability to remain free from injury will improve Outcome: Progressing   Problem: Skin Integrity: Goal: Risk for impaired skin integrity will decrease Outcome: Progressing   Problem: Respiratory: Goal: Ability to maintain a clear airway and adequate ventilation will improve Outcome: Progressing

## 2019-10-30 NOTE — Progress Notes (Signed)
Attempted to contact sister x2 to update on patient's status.  No answer at home phone and no answering machine.  Mobile phone goes straight to voicemail, left a brief message about attempting to contact her.

## 2019-10-30 NOTE — Plan of Care (Signed)
  Problem: Elimination: Goal: Will not experience complications related to urinary retention Outcome: Progressing   Problem: Pain Managment: Goal: General experience of comfort will improve Outcome: Progressing   Problem: Safety: Goal: Ability to remain free from injury will improve Outcome: Progressing   

## 2019-10-30 NOTE — Progress Notes (Signed)
PULMONARY / CRITICAL CARE MEDICINE   NAME:  Heather Zimmerman, MRN:  [id number], DOB:  11-06-1945, LOS: 2 ADMISSION DATE:  10/25/2019, CONSULTATION DATE:  11/12/2019 REFERRING MD:  ED, CHIEF COMPLAINT:  Hypercapnic respiratory failure  BRIEF HISTORY:    Patient is a 74 year old female nursing home patient morbidly obese with a BMI of greater than 48 history of COPD obstructive sleep apnea on BiPAP at night with peripheral vascular disease and history of right BKA in 2013 presents to the emergency room with a pH of 7.008 PCO2 of greater than 120 bicarb of 40.  She was disoriented with progressive deterioration in her respiratory status and mental status requiring intubation despite initial ventilation with BiPAP.  Chest x-ray suggests some pulmonary edema.  Last ejection fraction was 60% in 2019 suggesting possible diastolic dysfunction.  BNP is 153.  SIGNIFICANT PAST MEDICAL HISTORY    . Acute respiratory failure ([place]) 03/2018  . Chronic diastolic heart failure ([place]) 12/31/2012  . Chronic pain 12/31/2012  . CKD (chronic kidney disease), stage III ([place])   . Diabetes mellitus without complication ([place])    TYPE 2  . Essential hypertension, benign 12/31/2012  . GERD 12/31/2012  . Obstructive sleep apnea 07/05/2014  . Shortness of breath dyspnea   . 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 Unspecified vitamin D deficiency 12/31/2012  . Venous insufficiency (chronic) (peripheral)   . Venous stasis ulcers ([place])      SIGNIFICANT EVENTS:  10/22/2019 ICU Admission STUDIES:    CULTURES:  11/14/2019: Blood cultures NGTD 1/14: RVP Negative  1/14: Respiratory Culture negative 1/14: MRSA PCR: Positive 1/14: Respiratory culture: negative 1/15: spiked fever possible pna on chest xray started on vanc/cefipime   ANTIBIOTICS:  1/15 Vanc> 1/15 Cefepime>  LINES/TUBES:   1/14 Peripheral IV L Forearm and R  antecubital  1/14: NG Tube 1/14: 7.5 mm Airway  1/14: LIJ 1/14: R Arterial Line CONSULTANTS:  PCCM SUBJECTIVE:  O/N: No acute events S: reports headache this am, no abdominal pain, chest pain or other discomfort  CONSTITUTIONAL: BP (!) 112/47   Pulse 77   Temp 99.5 F (37.5 C) (Oral)   Resp (!) 21   Ht 5\' 4"  (1.626 m)   Wt 130 kg   LMP  (LMP Unknown)   SpO2 (!) 89%   BMI 49.19 kg/m   I/O last 3 completed shifts: In: 2407.6 [I.V.:537.8; NG/GT:1120; IV Piggyback:749.8] Out: 2030 [Urine:2030]     Vent Mode: PRVC FiO2 (%):  [60 %] 60 % Set Rate:  [18 bmp] 18 bmp Vt Set:  [430 mL] 430 mL PEEP:  [8 cmH20] 8 cmH20 Plateau Pressure:  [23 cmH20-25 cmH20] 25 cmH20  PHYSICAL EXAM: General:  intubated Neuro:  Answers questions appropriately, follows commands HEENT:  Normocephalic, atraumatic, intubated.  Cardiovascular: RRR, no murmurs, rubs, or gallops Lungs:  Distant breath sounds, no wheezes or rhonchi Abdomen:  Distended due to habitus, BS + Musculoskeletal:  R BKA no wound Skin:  Dry and warm  RESOLVED PROBLEM LIST   ASSESSMENT AND PLAN    Hypercapnic Respiratory Failure: Patient with OSA, COPD, Diastolic Heart Failure she is on bipap at home.   Appears that her CO2 normal level is in the 50-60 range from previous hospitalization a year ago.  - Continue weaning sedation, discuss goals of care with pt and family since she seemingly failed bipap at her prior facility we will need to discuss potential trach  Shock:  Patient with low MAP scores (40s) upon arrival. She was started on levophed with improvement. She has spiked a fever 1/15 , ? 2/2 Pna  Seen on  chest radiograph - off levo and bp borderline monitor and reinitiate if MAP <65 - continue vanc/cefepime -order ECHO  AKI on CKD Stage III: Cr increased from 2.21 from 2.56 from last admission - Improving, will continue to trend BMP and correcting electrolyte abnormalities.  - Avoid nephrotoxic drugs.   DM:  -  Continue CBG Checks - Continue Resistant SSI   COPD:  - Continue Proventil, Brovana, Pulmicort  OSA/OHS: Patient with home BiPAP use. Initially started on BiPAP in the ED, but decompensated and needed intubation.  -less likely that she will wean well from vent back to bipap and likely failed bipap in her facility will discuss goals of care and possible trach   GERD:  - Continue Protonix.   Best Practice / Goals of Care / Disposition.   DVT PROPHYLAXIS: Heparin  [id number]  NUTRITION:NPO MOBILITY:Bedrest GOALS OF CARE:Full FAMILY DISCUSSIONS: Called and updated family 1/16  DISPOSITION ICU  LABS  Glucose Recent Labs  Lab 10/29/19 0714 10/29/19 1101 10/29/19 1525 10/29/19 1918 10/29/19 2319 10/30/19 0318  GLUCAP 162* 195* 182* 194* 170* 174*    BMET Recent Labs  Lab 11/06/2019 0301 11/08/2019 1457 10/20/2019 1701 10/29/19 0548 10/29/19 0607  NA 142   < > 142 141 141  K 5.1   < > 4.3 4.1 4.2  CL 96*  --  96* 97*  --   CO2 33*  --  34* 36*  --   BUN 30*  --  37* 46*  --   CREATININE 2.47*  --  2.56* 2.21*  --   GLUCOSE 155*  --  142* 178*  --    < > = values in this interval not displayed.    Liver Enzymes No results for input(s): AST, ALT, ALKPHOS, BILITOT, ALBUMIN in the last 168 hours.  Electrolytes Recent Labs  Lab 10/21/2019 0301 11/06/2019 0642 10/27/2019 1701 10/29/19 0548  CALCIUM 8.5*  --  8.0* 8.0*  MG  --  2.0  --  1.9  PHOS  --   --   --  1.8*    CBC Recent Labs  Lab 11/05/2019 0301 11/09/2019 0301 10/18/2019 1457 10/29/19 0607 10/29/19 0947  WBC 5.8  --   --   --  3.5*  HGB 15.4*   < > 14.3 13.9 12.6  HCT 60.0*   < > 42.0 41.0 44.1  PLT 117*  --   --   --  97*   < > = values in this interval not displayed.    ABG Recent Labs  Lab 10/20/2019 0645 11/03/2019 1457 10/29/19 0607  PHART 7.184* 7.417 7.506*  PCO2ART 102* 62.1* 53.5*  PO2ART 193* 51.0* 52.0*    Coag's No results for input(s): APTT, INR in the last 168 hours.  Sepsis  Markers Recent Labs  Lab 11/08/2019 0408 11/01/2019 0642 10/29/19 0548 10/30/19 0436  LATICACIDVEN 1.0  --   --   --   PROCALCITON  --  <0.10 0.18 0.86    Cardiac Enzymes No results for input(s): TROPONINI, PROBNP in the last 168 hours.  PAST MEDICAL HISTORY :   She  has a past medical history of Acute respiratory failure ([place]) (03/2018), Chronic diastolic heart failure ([place]) (12/31/2012), Chronic pain (12/31/2012), CKD (chronic kidney disease), stage III, Diabetes mellitus without complication ([place]), Essential hypertension, benign (12/31/2012), GERD (12/31/2012),  Obstructive sleep apnea (07/05/2014), Shortness of breath dyspnea, Type II or unspecified type diabetes mellitus with peripheral circulatory disorders, not stated as uncontrolled(250.70) (12/31/2012), Unspecified vitamin D deficiency (12/31/2012), Venous insufficiency (chronic) (peripheral), and Venous stasis ulcers ([place]).  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  Allergies  Allergen Reactions  . Ace Inhibitors Other (See Comments)    Unknown - per MAR    No current facility-administered medications on file prior to encounter.   Current Outpatient Medications on File Prior to Encounter  Medication Sig  . amLODipine (NORVASC) 5 MG tablet Take 5 mg by mouth daily.  Marland Kitchen aspirin EC 81 MG tablet Take 81 mg by mouth daily.  . Cholecalciferol (VITAMIN D) 50 MCG (2000 UT) tablet Take 2,000 Units by mouth daily.  Marland Kitchen escitalopram (LEXAPRO) 5 MG tablet Take 7.5 mg by mouth daily.   . ferrous sulfate 325 (65 FE) MG tablet Take 325 mg by mouth daily with breakfast.  . Fluticasone-Salmeterol (ADVAIR) 250-50 MCG/DOSE AEPB Inhale 1 puff into the lungs every 12 (twelve) hours. Reported on 04/25/2016  . glucagon (GLUCAGON EMERGENCY) 1 MG injection Inject 1 mg into the vein once as needed (hypoglycemia).  Marland Kitchen guaiFENesin (MUCINEX) 600 MG 12 hr tablet Take  600 mg by mouth 2 (two) times daily.  . insulin glargine (LANTUS) 100 UNIT/ML injection Inject 0-20 Units into the skin at bedtime. 0-180: 0 units; 181-400: 20 units  . insulin lispro (HUMALOG) 100 UNIT/ML injection Inject 0-10 Units into the skin See admin instructions. Twice daily per sliding scale; 0-150, 0 units; 151-200, 2 units; 201-250, 4 units;  251-300; 6 units; 301-350, 8 units; 351-400, 10 units; call PCP if <70 or >400  . montelukast (SINGULAIR) 10 MG tablet Take 10 mg by mouth at bedtime.  Marland Kitchen omeprazole (PRILOSEC) 20 MG capsule Take 20 mg by mouth daily.  . OXYGEN Inhale 2 L/min into the lungs continuous. via nasal cannula  . sennosides-docusate sodium (SENOKOT-S) 8.6-50 MG tablet Take 2 tablets by mouth at bedtime.  . simvastatin (ZOCOR) 5 MG tablet Take 5 mg by mouth at bedtime.  Marland Kitchen tiotropium (SPIRIVA) 18 MCG inhalation capsule Place 18 mcg into inhaler and inhale daily.  Marland Kitchen torsemide (DEMADEX) 20 MG tablet Take 1 tablet (20 mg total) by mouth every other day. (Patient taking differently: Take 20 mg by mouth daily. )  . TRULICITY 1.5 0000000 SOPN Inject 1.5 mg into the skin every Thursday.  . vitamin C (ASCORBIC ACID) 500 MG tablet Take 500 mg by mouth 2 (two) times daily.  Marland Kitchen acetaminophen (TYLENOL) 325 MG tablet Take 2 tablets (650 mg total) by mouth every 6 (six) hours as needed for mild pain (or Fever >/= 101).  . gabapentin (NEURONTIN) 100 MG capsule Take 100 mg by mouth 2 (two) times daily.  . hydrocerin (EUCERIN) CREA Apply 1 application topically daily. (Patient not taking: Reported on 10/26/2019)  . ipratropium-albuterol (DUONEB) 0.5-2.5 (3) MG/3ML SOLN Take 3 mLs by nebulization every 6 (six) hours as needed (shortness of breath).  Marland Kitchen levofloxacin (LEVAQUIN) 250 MG tablet Take 1 tablet (250 mg total) by mouth daily. (Patient not taking: Reported on 10/26/2019)    FAMILY HISTORY:   Her family history includes Hypertension in an other family member.  SOCIAL HISTORY:  She   reports that she has never smoked. She has never used smokeless tobacco. She reports that she does not drink alcohol or  use drugs.  REVIEW OF SYSTEMS:    Intubated, denies abd, chest pain, does endorse headache

## 2019-10-31 LAB — POCT I-STAT 7, (LYTES, BLD GAS, ICA,H+H)
Acid-Base Excess: 13 mmol/L — ABNORMAL HIGH (ref 0.0–2.0)
Bicarbonate: 38.3 mmol/L — ABNORMAL HIGH (ref 20.0–28.0)
Calcium, Ion: 1.07 mmol/L — ABNORMAL LOW (ref 1.15–1.40)
HCT: 38 % (ref 36.0–46.0)
Hemoglobin: 12.9 g/dL (ref 12.0–15.0)
O2 Saturation: 89 %
Patient temperature: 102.1
Potassium: 3.7 mmol/L (ref 3.5–5.1)
Sodium: 144 mmol/L (ref 135–145)
TCO2: 40 mmol/L — ABNORMAL HIGH (ref 22–32)
pCO2 arterial: 51.9 mmHg — ABNORMAL HIGH (ref 32.0–48.0)
pH, Arterial: 7.482 — ABNORMAL HIGH (ref 7.350–7.450)
pO2, Arterial: 59 mmHg — ABNORMAL LOW (ref 83.0–108.0)

## 2019-10-31 LAB — CBC WITH DIFFERENTIAL/PLATELET
Abs Immature Granulocytes: 0.02 10*3/uL (ref 0.00–0.07)
Basophils Absolute: 0 10*3/uL (ref 0.0–0.1)
Basophils Relative: 0 %
Eosinophils Absolute: 0 10*3/uL (ref 0.0–0.5)
Eosinophils Relative: 0 %
HCT: 45.4 % (ref 36.0–46.0)
Hemoglobin: 13 g/dL (ref 12.0–15.0)
Immature Granulocytes: 1 %
Lymphocytes Relative: 25 %
Lymphs Abs: 0.5 10*3/uL — ABNORMAL LOW (ref 0.7–4.0)
MCH: 27.6 pg (ref 26.0–34.0)
MCHC: 28.6 g/dL — ABNORMAL LOW (ref 30.0–36.0)
MCV: 96.4 fL (ref 80.0–100.0)
Monocytes Absolute: 0.3 10*3/uL (ref 0.1–1.0)
Monocytes Relative: 12 %
Neutro Abs: 1.3 10*3/uL — ABNORMAL LOW (ref 1.7–7.7)
Neutrophils Relative %: 62 %
Platelets: 88 10*3/uL — ABNORMAL LOW (ref 150–400)
RBC: 4.71 MIL/uL (ref 3.87–5.11)
RDW: 18.1 % — ABNORMAL HIGH (ref 11.5–15.5)
WBC: 2.1 10*3/uL — ABNORMAL LOW (ref 4.0–10.5)
nRBC: 0 % (ref 0.0–0.2)

## 2019-10-31 LAB — GLUCOSE, CAPILLARY
Glucose-Capillary: 156 mg/dL — ABNORMAL HIGH (ref 70–99)
Glucose-Capillary: 162 mg/dL — ABNORMAL HIGH (ref 70–99)
Glucose-Capillary: 176 mg/dL — ABNORMAL HIGH (ref 70–99)
Glucose-Capillary: 178 mg/dL — ABNORMAL HIGH (ref 70–99)
Glucose-Capillary: 184 mg/dL — ABNORMAL HIGH (ref 70–99)
Glucose-Capillary: 193 mg/dL — ABNORMAL HIGH (ref 70–99)

## 2019-10-31 LAB — COMPREHENSIVE METABOLIC PANEL
ALT: 19 U/L (ref 0–44)
AST: 45 U/L — ABNORMAL HIGH (ref 15–41)
Albumin: 1.7 g/dL — ABNORMAL LOW (ref 3.5–5.0)
Alkaline Phosphatase: 39 U/L (ref 38–126)
Anion gap: 10 (ref 5–15)
BUN: 65 mg/dL — ABNORMAL HIGH (ref 8–23)
CO2: 34 mmol/L — ABNORMAL HIGH (ref 22–32)
Calcium: 7.8 mg/dL — ABNORMAL LOW (ref 8.9–10.3)
Chloride: 102 mmol/L (ref 98–111)
Creatinine, Ser: 2.11 mg/dL — ABNORMAL HIGH (ref 0.44–1.00)
GFR calc Af Amer: 26 mL/min — ABNORMAL LOW (ref >60)
GFR calc non Af Amer: 22 mL/min — ABNORMAL LOW (ref >60)
Glucose, Bld: 182 mg/dL — ABNORMAL HIGH (ref 70–99)
Potassium: 3.8 mmol/L (ref 3.5–5.1)
Sodium: 146 mmol/L — ABNORMAL HIGH (ref 135–145)
Total Bilirubin: 0.9 mg/dL (ref 0.3–1.2)
Total Protein: 5.4 g/dL — ABNORMAL LOW (ref 6.5–8.1)

## 2019-10-31 LAB — HEPARIN INDUCED PLATELET AB (HIT ANTIBODY): Heparin Induced Plt Ab: 0.094 OD (ref 0.000–0.400)

## 2019-10-31 MED ORDER — POTASSIUM CHLORIDE 20 MEQ/15ML (10%) PO SOLN
40.0000 meq | Freq: Once | ORAL | Status: AC
  Administered 2019-10-31: 09:00:00 40 meq via ORAL
  Filled 2019-10-31: qty 30

## 2019-10-31 MED ORDER — VANCOMYCIN HCL 1500 MG/300ML IV SOLN
1500.0000 mg | INTRAVENOUS | Status: DC
  Administered 2019-10-31: 1500 mg via INTRAVENOUS
  Filled 2019-10-31: qty 300

## 2019-10-31 NOTE — Progress Notes (Addendum)
PULMONARY / CRITICAL CARE MEDICINE   NAME:  Heather Zimmerman, MRN:  [id number], DOB:  Sep 16, 1946, LOS: 3 ADMISSION DATE:  10/30/2019, CONSULTATION DATE:  10/30/2019 REFERRING MD:  ED, CHIEF COMPLAINT:  Hypercapnic respiratory failure  BRIEF HISTORY:    Patient is a 74 year old female nursing home patient morbidly obese with a BMI of greater than 48 history of COPD obstructive sleep apnea on BiPAP at night with peripheral vascular disease and history of right BKA in 2013 presents to the emergency room with a pH of 7.008 PCO2 of greater than 120 bicarb of 40. She was disoriented with progressive deterioration in her respiratory status and mental status requiring intubation despite initial ventilation with BiPAP.  Chest x-ray suggests some pulmonary edema.  Last ejection fraction was 60% in 2019 suggesting possible diastolic dysfunction.   SIGNIFICANT PAST MEDICAL HISTORY    . Acute respiratory failure ([place]) 03/2018  . Chronic diastolic heart failure ([place]) 12/31/2012  . Chronic pain 12/31/2012  . CKD (chronic kidney disease), stage III ([place])   . Diabetes mellitus without complication ([place])    TYPE 2  . Essential hypertension, benign 12/31/2012  . GERD 12/31/2012  . Obstructive sleep apnea 07/05/2014  . Shortness of breath dyspnea   . 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 Unspecified vitamin D deficiency 12/31/2012  . Venous insufficiency (chronic) (peripheral)   . Venous stasis ulcers ([place])      SIGNIFICANT EVENTS:  10/22/2019 ICU Admission 10/29/19 AM Febrile, Ventilated 10/30/19: Kidney function improving, worsening thrombocytopenia HIT panel ordered, unable to reach family for [place] discussion. 10/31/19: increased awareness, Continued ventilation 80% FiO2, reach out to family for [place] due to failure on at home bipap.  STUDIES:   10/31/19: VAS [place] LE VENOUS BILAT Summary: Right: There is no evidence of  deep vein thrombosis in the lower extremity. However, portions of this examination were limited- see technologist comments above. Left: There is no evidence of deep vein thrombosis in the lower extremity. However, portions of this examination were limited- see technologist comments above. Risk Factors: Right BKA. Limitations: Body habitus and bandages. Comparison Study: No prior study on file for comparison.  CULTURES:  10/30/2019: Blood cultures NGTD 1/14: RVP Negative  1/14: Respiratory Culture negative 1/14: MRSA PCR: Positive 1/14: Respiratory culture: negative 1/15: spiked fever possible pna on chest xray started on vanc/cefipime.   ANTIBIOTICS:  1/15 Vanc> 1/15 Cefepime>  LINES/TUBES:   1/14 Peripheral IV L Forearm and R antecubital  1/14: NG Tube 1/14: 7.5 mm Airway  1/14: LIJ 1/14: R Arterial Line CONSULTANTS:  PCCM SUBJECTIVE:  O/N: No acute events S: Patient responsive today, able to answer by nodding and shaking her head. Denies diaphoresis, chest pain, abdominal pain, or headaches.   CONSTITUTIONAL: BP (!) 96/55   Pulse 74   Temp 98.8 F (37.1 C) (Oral)   Resp 18   Ht 5\' 4"  (1.626 m)   Wt 134.2 kg   LMP  (LMP Unknown)   SpO2 90%   BMI 50.78 kg/m   I/O last 3 completed shifts: In: 1585.4 [I.V.:55.4; Other:20; NG/GT:1210; IV Piggyback:300] Out: 1740 [Urine:1740]     Vent Mode: PRVC FiO2 (%):  [60 %-80 %] 80 % Set Rate:  [18 bmp] 18 bmp Vt Set:  [430 mL] 430 mL PEEP:  [10 cmH20] 10 cmH20 Plateau Pressure:  [23 cmH20-25 cmH20] 24 cmH20  PHYSICAL EXAM: General:  Patient alert, non diaphoretic, no acute distress.  Neuro:  Answers questions appropriately by nodding/shaking head HEENT:  Normocephalic, atraumatic, intubated, no scleral icterus noted bilaterally.  Cardiovascular: RRR, no murmurs, rubs, or gallops Lungs:  Crackles appreciated bilaterally, no wheezes or rhonchi. Abdomen:  Distended due to habitus, BS +, non tender to palpation.   Musculoskeletal:  R BKA no wound, well bandaged L lower extremity with no erythema or purulent drainage noted.  Skin:  Dry and warm  RESOLVED PROBLEM LIST   ASSESSMENT AND PLAN    Hypercapnic Respiratory Failure: Patient with OSA, COPD, Diastolic Heart Failure she is on bipap at home. Appears that her CO2 normal level is in the 50-60 range from previous hospitalization a year ago.  - Continue weaning sedation, discuss goals of care with pt and family since she seemingly failed bipap at her prior facility we will need to discuss potential trach.  - Latest ABG: pH 7.482, CO2 51.9, Bicarb 38.3  Shock:  Patient with low MAP scores (40s) upon arrival. She was started on levophed with improvement. She has spiked a fever 1/15 with leukocytopenia, ? 2/2 Pna  Seen on  chest radiograph. - MRSA + - WBC: 2.1 - off levo and bp borderline monitor and reinitiate if MAP <65 - continue vanc/cefepime  Thrombocytopenia:   Platelet count of 88 from 85 10/30/19  - CBC: Pending - HIT Panel: Pending - U/S DVT Study negative, but limited due to bandaging and habitus.   AKI on CKD Stage III: Cr 2.11 from 2.56 from last admission - Improving, will continue to trend BMP and correcting electrolyte abnormalities.  - Avoid nephrotoxic drugs.   DM:  - Continue CBG Checks - Continue Resistant SSI   COPD:  - Continue Proventil, Brovana, Pulmicort  OSA/OHS: Patient with home BiPAP use. Initially started on BiPAP in the ED, but decompensated and needed intubation.  -less likely that she will wean well from vent back to bipap and likely failed bipap in her facility will discuss goals of care and possible trach   GERD:  - Continue Protonix.   Best Practice / Goals of Care / Disposition.   DVT PROPHYLAXIS: Heparin  [id number]  NUTRITION:NPO MOBILITY:Bedrest GOALS OF CARE:Full FAMILY DISCUSSIONS: Called and updated family 1/16  DISPOSITION ICU  LABS  Glucose Recent Labs  Lab 10/30/19 1545  10/30/19 1937 10/30/19 2341 10/31/19 0342 10/31/19 0710 10/31/19 1109  GLUCAP 185* 214* 198* 176* 162* 156*    BMET Recent Labs  Lab 10/29/19 0548 10/29/19 0607 10/30/19 0803 10/31/19 0356 10/31/19 0446  NA 141   < > 145 144 146*  K 4.1   < > 3.7 3.7 3.8  CL 97*  --  102  --  102  CO2 36*  --  34*  --  34*  BUN 46*  --  51*  --  65*  CREATININE 2.21*  --  1.93*  --  2.11*  GLUCOSE 178*  --  191*  --  182*   < > = values in this interval not displayed.    Liver Enzymes Recent Labs  Lab 10/31/19 0446  AST 45*  ALT 19  ALKPHOS 39  BILITOT 0.9  ALBUMIN 1.7*    Electrolytes Recent Labs  Lab 10/15/2019 0642 11/13/2019 1701 10/29/19 0548 10/30/19 0803 10/31/19 0446  CALCIUM  --    < > 8.0* 7.8* 7.8*  MG 2.0  --  1.9  --   --   PHOS  --   --  1.8* 3.3  --    < > =  values in this interval not displayed.    CBC Recent Labs  Lab 10/29/19 0947 10/29/19 0947 10/30/19 0803 10/31/19 0356 10/31/19 0446  WBC 3.5*  --  2.1*  --  2.1*  HGB 12.6   < > 12.8 12.9 13.0  HCT 44.1   < > 45.0 38.0 45.4  PLT 97*  --  85*  --  88*   < > = values in this interval not displayed.    ABG Recent Labs  Lab 11/05/2019 1457 10/29/19 0607 10/31/19 0356  PHART 7.417 7.506* 7.482*  PCO2ART 62.1* 53.5* 51.9*  PO2ART 51.0* 52.0* 59.0*    Coag's No results for input(s): APTT, INR in the last 168 hours.  Sepsis Markers Recent Labs  Lab 11/08/2019 0408 11/01/2019 0642 10/29/19 0548 10/30/19 0436  LATICACIDVEN 1.0  --   --   --   PROCALCITON  --  <0.10 0.18 0.86    Cardiac Enzymes No results for input(s): TROPONINI, PROBNP in the last 168 hours.  Allergies  Allergen Reactions  . Ace Inhibitors Other (See Comments)    Unknown - per MAR    No current facility-administered medications on file prior to encounter.   Current Outpatient Medications on File Prior to Encounter  Medication Sig  . amLODipine (NORVASC) 5 MG tablet Take 5 mg by mouth daily.  Marland Kitchen aspirin EC 81 MG  tablet Take 81 mg by mouth daily.  . Cholecalciferol (VITAMIN D) 50 MCG (2000 UT) tablet Take 2,000 Units by mouth daily.  Marland Kitchen escitalopram (LEXAPRO) 5 MG tablet Take 7.5 mg by mouth daily.   . ferrous sulfate 325 (65 FE) MG tablet Take 325 mg by mouth daily with breakfast.  . Fluticasone-Salmeterol (ADVAIR) 250-50 MCG/DOSE AEPB Inhale 1 puff into the lungs every 12 (twelve) hours. Reported on 04/25/2016  . glucagon (GLUCAGON EMERGENCY) 1 MG injection Inject 1 mg into the vein once as needed (hypoglycemia).  Marland Kitchen guaiFENesin (MUCINEX) 600 MG 12 hr tablet Take 600 mg by mouth 2 (two) times daily.  . insulin glargine (LANTUS) 100 UNIT/ML injection Inject 0-20 Units into the skin at bedtime. 0-180: 0 units; 181-400: 20 units  . insulin lispro (HUMALOG) 100 UNIT/ML injection Inject 0-10 Units into the skin See admin instructions. Twice daily per sliding scale; 0-150, 0 units; 151-200, 2 units; 201-250, 4 units;  251-300; 6 units; 301-350, 8 units; 351-400, 10 units; call PCP if <70 or >400  . montelukast (SINGULAIR) 10 MG tablet Take 10 mg by mouth at bedtime.  Marland Kitchen omeprazole (PRILOSEC) 20 MG capsule Take 20 mg by mouth daily.  . OXYGEN Inhale 2 L/min into the lungs continuous. via nasal cannula  . sennosides-docusate sodium (SENOKOT-S) 8.6-50 MG tablet Take 2 tablets by mouth at bedtime.  . simvastatin (ZOCOR) 5 MG tablet Take 5 mg by mouth at bedtime.  Marland Kitchen tiotropium (SPIRIVA) 18 MCG inhalation capsule Place 18 mcg into inhaler and inhale daily.  Marland Kitchen torsemide (DEMADEX) 20 MG tablet Take 1 tablet (20 mg total) by mouth every other day. (Patient taking differently: Take 20 mg by mouth daily. )  . TRULICITY 1.5 0000000 SOPN Inject 1.5 mg into the skin every Thursday.  . vitamin C (ASCORBIC ACID) 500 MG tablet Take 500 mg by mouth 2 (two) times daily.  Marland Kitchen acetaminophen (TYLENOL) 325 MG tablet Take 2 tablets (650 mg total) by mouth every 6 (six) hours as needed for mild pain (or Fever >/= 101).  . gabapentin  (NEURONTIN) 100 MG capsule Take 100 mg by  mouth 2 (two) times daily.  . hydrocerin (EUCERIN) CREA Apply 1 application topically daily. (Patient not taking: Reported on 11/11/2019)  . ipratropium-albuterol (DUONEB) 0.5-2.5 (3) MG/3ML SOLN Take 3 mLs by nebulization every 6 (six) hours as needed (shortness of breath).  Marland Kitchen levofloxacin (LEVAQUIN) 250 MG tablet Take 1 tablet (250 mg total) by mouth daily. (Patient not taking: Reported on 11/10/2019)    REVIEW OF SYSTEMS:    Intubated, denies abd, chest pain, does endorse headache

## 2019-10-31 NOTE — Progress Notes (Signed)
Spoke with patient's sister, Ms. Anette Guarneri, updates about Ms. Leist. We spoke about her following directions and answering questions by nodding and shaking her head. We spoke about the possibility of a tracheostomy if her sister declines or is unable to come off the vent. As long as Ms. Ashurst's mentation improves we can have the discussion with her, but did make Ms. Estill Bamberg aware that a tracheostomy is within the realm of possibility if her sister cannot be weaned. She voiced understanding.  Maudie Mercury, MD IMTS, PGY-1 Pager: [phone]

## 2019-11-01 ENCOUNTER — Inpatient Hospital Stay (HOSPITAL_COMMUNITY): Payer: Medicare (Managed Care)

## 2019-11-01 DIAGNOSIS — R6521 Severe sepsis with septic shock: Secondary | ICD-10-CM

## 2019-11-01 DIAGNOSIS — I5043 Acute on chronic combined systolic (congestive) and diastolic (congestive) heart failure: Secondary | ICD-10-CM

## 2019-11-01 DIAGNOSIS — E662 Morbid (severe) obesity with alveolar hypoventilation: Secondary | ICD-10-CM

## 2019-11-01 DIAGNOSIS — A419 Sepsis, unspecified organism: Secondary | ICD-10-CM

## 2019-11-01 DIAGNOSIS — L8990 Pressure ulcer of unspecified site, unspecified stage: Secondary | ICD-10-CM | POA: Diagnosis present

## 2019-11-01 LAB — POCT I-STAT 7, (LYTES, BLD GAS, ICA,H+H)
Acid-Base Excess: 9 mmol/L — ABNORMAL HIGH (ref 0.0–2.0)
Bicarbonate: 35.5 mmol/L — ABNORMAL HIGH (ref 20.0–28.0)
Calcium, Ion: 1.19 mmol/L (ref 1.15–1.40)
HCT: 46 % (ref 36.0–46.0)
Hemoglobin: 15.6 g/dL — ABNORMAL HIGH (ref 12.0–15.0)
O2 Saturation: 89 %
Patient temperature: 99
Potassium: 4 mmol/L (ref 3.5–5.1)
Sodium: 147 mmol/L — ABNORMAL HIGH (ref 135–145)
TCO2: 37 mmol/L — ABNORMAL HIGH (ref 22–32)
pCO2 arterial: 55.9 mmHg — ABNORMAL HIGH (ref 32.0–48.0)
pH, Arterial: 7.412 (ref 7.350–7.450)
pO2, Arterial: 59 mmHg — ABNORMAL LOW (ref 83.0–108.0)

## 2019-11-01 LAB — CBC WITH DIFFERENTIAL/PLATELET
Abs Immature Granulocytes: 0.02 10*3/uL (ref 0.00–0.07)
Basophils Absolute: 0 10*3/uL (ref 0.0–0.1)
Basophils Relative: 0 %
Eosinophils Absolute: 0.1 10*3/uL (ref 0.0–0.5)
Eosinophils Relative: 2 %
HCT: 50.8 % — ABNORMAL HIGH (ref 36.0–46.0)
Hemoglobin: 14.2 g/dL (ref 12.0–15.0)
Immature Granulocytes: 1 %
Lymphocytes Relative: 19 %
Lymphs Abs: 0.5 10*3/uL — ABNORMAL LOW (ref 0.7–4.0)
MCH: 27.6 pg (ref 26.0–34.0)
MCHC: 28 g/dL — ABNORMAL LOW (ref 30.0–36.0)
MCV: 98.8 fL (ref 80.0–100.0)
Monocytes Absolute: 0.3 10*3/uL (ref 0.1–1.0)
Monocytes Relative: 12 %
Neutro Abs: 1.7 10*3/uL (ref 1.7–7.7)
Neutrophils Relative %: 66 %
Platelets: 71 10*3/uL — ABNORMAL LOW (ref 150–400)
RBC: 5.14 MIL/uL — ABNORMAL HIGH (ref 3.87–5.11)
RDW: 18.6 % — ABNORMAL HIGH (ref 11.5–15.5)
WBC: 2.6 10*3/uL — ABNORMAL LOW (ref 4.0–10.5)
nRBC: 0 % (ref 0.0–0.2)

## 2019-11-01 LAB — COMPREHENSIVE METABOLIC PANEL
ALT: 21 U/L (ref 0–44)
AST: 54 U/L — ABNORMAL HIGH (ref 15–41)
Albumin: 1.7 g/dL — ABNORMAL LOW (ref 3.5–5.0)
Alkaline Phosphatase: 51 U/L (ref 38–126)
Anion gap: 10 (ref 5–15)
BUN: 78 mg/dL — ABNORMAL HIGH (ref 8–23)
CO2: 34 mmol/L — ABNORMAL HIGH (ref 22–32)
Calcium: 8.2 mg/dL — ABNORMAL LOW (ref 8.9–10.3)
Chloride: 103 mmol/L (ref 98–111)
Creatinine, Ser: 2.45 mg/dL — ABNORMAL HIGH (ref 0.44–1.00)
GFR calc Af Amer: 22 mL/min — ABNORMAL LOW (ref >60)
GFR calc non Af Amer: 19 mL/min — ABNORMAL LOW (ref >60)
Glucose, Bld: 214 mg/dL — ABNORMAL HIGH (ref 70–99)
Potassium: 4.1 mmol/L (ref 3.5–5.1)
Sodium: 147 mmol/L — ABNORMAL HIGH (ref 135–145)
Total Bilirubin: 0.7 mg/dL (ref 0.3–1.2)
Total Protein: 6 g/dL — ABNORMAL LOW (ref 6.5–8.1)

## 2019-11-01 LAB — GLUCOSE, CAPILLARY
Glucose-Capillary: 188 mg/dL — ABNORMAL HIGH (ref 70–99)
Glucose-Capillary: 167 mg/dL — ABNORMAL HIGH (ref 70–99)
Glucose-Capillary: 143 mg/dL — ABNORMAL HIGH (ref 70–99)
Glucose-Capillary: 161 mg/dL — ABNORMAL HIGH (ref 70–99)
Glucose-Capillary: 197 mg/dL — ABNORMAL HIGH (ref 70–99)

## 2019-11-01 LAB — PROCALCITONIN: Procalcitonin: 1.48 ng/mL

## 2019-11-01 MED ORDER — CHLORHEXIDINE GLUCONATE CLOTH 2 % EX PADS
6.0000 | MEDICATED_PAD | Freq: Every day | CUTANEOUS | Status: DC
  Administered 2019-11-02: 6 via TOPICAL

## 2019-11-01 MED ORDER — DEXMEDETOMIDINE HCL IN NACL 400 MCG/100ML IV SOLN
0.4000 ug/kg/h | INTRAVENOUS | Status: DC
  Administered 2019-11-01: 23:00:00 0.4 ug/kg/h via INTRAVENOUS
  Administered 2019-11-02: 0.7 ug/kg/h via INTRAVENOUS
  Administered 2019-11-02: 0.6 ug/kg/h via INTRAVENOUS
  Administered 2019-11-02: 1 ug/kg/h via INTRAVENOUS
  Administered 2019-11-02: 0.7 ug/kg/h via INTRAVENOUS
  Administered 2019-11-02: 0.6 ug/kg/h via INTRAVENOUS
  Administered 2019-11-02: 0.9 ug/kg/h via INTRAVENOUS
  Administered 2019-11-03 – 2019-11-04 (×4): 0.5 ug/kg/h via INTRAVENOUS
  Filled 2019-11-01 (×11): qty 100

## 2019-11-01 MED ORDER — FENTANYL 2500MCG IN NS 250ML (10MCG/ML) PREMIX INFUSION
0.0000 ug/h | INTRAVENOUS | Status: DC
  Administered 2019-11-01: 50 ug/h via INTRAVENOUS
  Administered 2019-11-03 (×2): 150 ug/h via INTRAVENOUS
  Filled 2019-11-01 (×4): qty 250

## 2019-11-01 MED ORDER — INSULIN GLARGINE 100 UNIT/ML ~~LOC~~ SOLN
6.0000 [IU] | Freq: Every day | SUBCUTANEOUS | Status: DC
  Administered 2019-11-01: 6 [IU] via SUBCUTANEOUS
  Filled 2019-11-01 (×2): qty 0.06

## 2019-11-01 MED ORDER — MUPIROCIN 2 % EX OINT
1.0000 "application " | TOPICAL_OINTMENT | Freq: Two times a day (BID) | CUTANEOUS | Status: DC
  Administered 2019-11-01 – 2019-11-03 (×6): 1 via NASAL
  Filled 2019-11-01: qty 22

## 2019-11-01 MED ORDER — NOREPINEPHRINE 4 MG/250ML-% IV SOLN
0.0000 ug/min | INTRAVENOUS | Status: DC
  Administered 2019-11-01: 10 ug/min via INTRAVENOUS
  Administered 2019-11-01: 16:00:00 5 ug/min via INTRAVENOUS
  Administered 2019-11-02: 10 ug/min via INTRAVENOUS
  Filled 2019-11-01 (×3): qty 250

## 2019-11-01 MED ORDER — FUROSEMIDE 10 MG/ML IJ SOLN
80.0000 mg | Freq: Once | INTRAMUSCULAR | Status: AC
  Administered 2019-11-01: 13:00:00 80 mg via INTRAVENOUS
  Filled 2019-11-01: qty 8

## 2019-11-01 MED ORDER — VANCOMYCIN VARIABLE DOSE PER UNSTABLE RENAL FUNCTION (PHARMACIST DOSING): Status: DC

## 2019-11-01 NOTE — Procedures (Signed)
Bronchoscopy Procedure Note Heather Zimmerman [id number] 10-07-1946  Procedure: Bronchoscopy Indications: Diagnostic evaluation of the airways  Procedure Details Consent: Risks of procedure as well as the alternatives and risks of each were explained to the (patient/caregiver).  Consent for procedure obtained. Time Out: Verified patient identification, verified procedure, site/side was marked, verified correct patient position, special equipment/implants available, medications/allergies/relevent history reviewed, required imaging and test results available.  Performed:  No mucus plugs noted, some loose proximal mucus in left mainstem, suctioned, bal for gram and culture sent.  In preparation for procedure, patient was given 100% FiO2 and bronchoscope lubricated. Sedation: 100 mg fentanyl  Airway entered and the following bronchi were examined: Bronchi.   Procedures performed: Brushings performed Patient placed back on 100% FiO2 at conclusion of procedure.    Evaluation Hemodynamic Status: BP stable throughout; O2 sats: stable throughout Patient's Current Condition: stable Specimens:  Sent serosanguinous fluid Complications: No apparent complications Patient did tolerate procedure well.   Shellia Cleverly 11/01/2019

## 2019-11-01 NOTE — Progress Notes (Signed)
Patient continued to desaturate over the last two hours, all efforts with multiple ventilator changes, heavier sedation to improve control of breathing and repositioning with right side down have resulted in no appreciable improvement in her oxygenation.  Discussed with her next of kin Ms Caprice Beaver and she agrees we should continue to treat her but not perform CPR, cardioversion or chemical resuscitation.

## 2019-11-01 NOTE — Progress Notes (Addendum)
PULMONARY / CRITICAL CARE MEDICINE   NAME:  Heather Zimmerman, MRN:  [id number], DOB:  [birth year], LOS: 4 ADMISSION DATE:  10/24/2019, CONSULTATION DATE:  10/20/2019 REFERRING MD:  ED, CHIEF COMPLAINT:  Hypercapnic respiratory failure  BRIEF HISTORY:    Patient is a 74 year old female nursing home patient morbidly obese with a BMI of greater than 48 history of COPD obstructive sleep apnea on BiPAP at night with peripheral vascular disease and history of right BKA in 2013 presents to the emergency room with a pH of 7.008 PCO2 of greater than 120 bicarb of 40. She was disoriented with progressive deterioration in her respiratory status and mental status requiring intubation despite initial ventilation with BiPAP.  Chest x-ray suggests some pulmonary edema.  Last ejection fraction was 60% in 2019 suggesting possible diastolic dysfunction.   SIGNIFICANT PAST MEDICAL HISTORY    . Acute respiratory failure ([place]) 03/2018  . Chronic diastolic heart failure ([place]) 12/31/2012  . Chronic pain 12/31/2012  . CKD (chronic kidney disease), stage III ([place])   . Diabetes mellitus without complication ([place])    TYPE 2  . Essential hypertension, benign 12/31/2012  . GERD 12/31/2012  . Obstructive sleep apnea 07/05/2014  . Shortness of breath dyspnea   . 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 Unspecified vitamin D deficiency 12/31/2012  . Venous insufficiency (chronic) (peripheral)   . Venous stasis ulcers ([place])      SIGNIFICANT EVENTS:  11/02/2019 ICU Admission 10/29/19 AM Febrile, Ventilated 10/30/19: Kidney function improving, worsening thrombocytopenia HIT panel ordered, unable to reach family for [place] discussion. 10/31/19: increased awareness, Continued ventilation 80% FiO2, reach out to family for [place] due to failure on at home bipap.  11/01/19: Tremors/neurological status worse today, spiked fever to 101, renal function  worsening, O2 requirements increasing, trial of diuresis STUDIES:   10/31/19: VAS [place] LE VENOUS BILAT Summary: Right: There is no evidence of deep vein thrombosis in the lower extremity. However, portions of this examination were limited- see technologist comments above. Left: There is no evidence of deep vein thrombosis in the lower extremity. However, portions of this examination were limited- see technologist comments above. Risk Factors: Right BKA. Limitations: Body habitus and bandages. Comparison Study: No prior study on file for comparison.  1/16 HIT panel negative  CULTURES:  11/03/2019: Blood cultures NGTD 1/14: RVP Negative  1/14: Respiratory Culture negative 1/14: MRSA PCR: Positive 1/14: Respiratory culture: negative  ANTIBIOTICS:  1/15 Vanc>1/17 1/15 Cefepime>1/20  LINES/TUBES:  1/14 Peripheral IV L Forearm and R antecubital  1/14: NG Tube 1/14: 7.5 mm Airway  1/14: LIJ 1/14: R Arterial Line CONSULTANTS:  PCCM SUBJECTIVE:  O/N: No acute events S: less responsive today and with worsening tremors but able to answer some occasional yes no questions by nodding and shaking her head. She is having some pain but cannot tell us where. CONSTITUTIONAL: BP 115/76   Pulse 90   Temp 99.9 F (37.7 C) (Oral)   Resp (!) 22   Ht 5\' 4"  (1.626 m)   Wt 132.8 kg   LMP  (LMP Unknown)   SpO2 93%   BMI 50.25 kg/m   I/O last 3 completed shifts: In: 1644.2 [I.V.:4.4; NG/GT:1140; IV Piggyback:499.8] Out: 1620 [Urine:1620]     Vent Mode: PRVC FiO2 (%):  [100 %] 100 % Set Rate:  [18 bmp] 18 bmp Vt Set:  [430 mL] 430 mL PEEP:  [14 cmH20] 14 cmH20 Plateau Pressure:  [  [place]: General:  Patient alert, non diaphoretic, no acute distress.  Neuro:  Answering some questions and follows some commands but less interactive, tracks you with her eyes  HEENT:  Normocephalic, atraumatic, intubated, no scleral icterus noted bilaterally.  Cardiovascular:  RRR, no murmurs, rubs, or gallops Lungs:  Fine basilar crackles, no wheezes or rhonchi. Abdomen:  Distended due to habitus, BS +, non tender to palpation.  Musculoskeletal:  R BKA no wound, well bandaged L lower extremity with no erythema or purulent drainage noted.  Skin:  Dry and warm  RESOLVED PROBLEM LIST   ASSESSMENT AND PLAN    Hypercapnic Respiratory Failure: Patient with OSA, COPD, Diastolic Heart Failure she is on bipap at home.  Appears that her CO2 normal level is in the 50-60 range from previous hospitalization a year ago.  Repeated ABG and chest x-ray/bedside ultrasound 1/18 due to worsening hypoxemia, inspiratory hold shows relatively normal compliance -trial of diuresis begin with lasix 80, wean peep/fio2 as able.  -ongoing goals of care discussion with family they are prepared for the possibility of trach  Shock:  Patient with low MAP scores (40s) upon arrival. She was started on levophed with improvement. She has spiked a fever 1/15 with leukopenia, ? 2/2 Pna  Seen on chest radiograph. MRSA + - off levo monitor and reinitiate if MAP <65 - vanc d/ced 1/17, continue cefepime to 10/20 - procalcitonin, repeat blood cultures  Thrombocytopenia:   Platelet count trending down, HIT panel from 1/16 negative, possibly from critical illness and or infection - continue monitoring  AKI on CKD Stage III: Cr worsening last several days - may be a component of vascular congestion trend BMP and correcting electrolyte abnormalities. - trial of diuresis  - Avoid nephrotoxic drugs.   DM:  - Continue CBG Checks - Continue Resistant SSI   COPD:  - Continue Proventil, Brovana, Pulmicort  OSA/OHS: Patient with home BiPAP use. Initially started on BiPAP in the ED, but decompensated and needed intubation.  -less likely that she will wean well from vent back to bipap and likely failed bipap in her facility ongoing family discussions possibility of trach depending on clinical  course.  GERD:  - Continue Protonix.   Best Practice / Goals of Care / Disposition.   DVT PROPHYLAXIS: Heparin  [id number]  NUTRITION:NPO MOBILITY:Bedrest GOALS OF CARE:Full FAMILY DISCUSSIONS: Called and updated family 1/18  DISPOSITION ICU  LABS  Glucose Recent Labs  Lab 10/31/19 0710 10/31/19 1109 10/31/19 1513 10/31/19 1920 10/31/19 2334 11/01/19 0332  GLUCAP 162* 156* 193* 178* 184* 188*    BMET Recent Labs  Lab 10/30/19 0803 10/30/19 0803 10/31/19 0356 10/31/19 0446 11/01/19 0418  NA 145   < > 144 146* 147*  K 3.7   < > 3.7 3.8 4.1  CL 102  --   --  102 103  CO2 34*  --   --  34* 34*  BUN 51*  --   --  65* 78*  CREATININE 1.93*  --   --  2.11* 2.45*  GLUCOSE 191*  --   --  182* 214*   < > = values in this interval not displayed.    Liver Enzymes Recent Labs  Lab 10/31/19 0446 11/01/19 0418  AST 45* 54*  ALT 19 21  ALKPHOS 39 51  BILITOT 0.9 0.7  ALBUMIN 1.7* 1.7*    Electrolytes Recent Labs  Lab 11/09/2019 [id number] 11/06/2019 1701 10/29/19 0548 10/29/19 0548 10/30/19 [id number]  10/31/19 0446 11/01/19 0418  CALCIUM  --    < > 8.0*   < > 7.8* 7.8* 8.2*  MG 2.0  --  1.9  --   --   --   --   PHOS  --   --  1.8*  --  3.3  --   --    < > = values in this interval not displayed.    CBC Recent Labs  Lab 10/30/19 0803 10/30/19 0803 10/31/19 0356 10/31/19 0446 11/01/19 0418  WBC 2.1*  --   --  2.1* 2.6*  HGB 12.8   < > 12.9 13.0 14.2  HCT 45.0   < > 38.0 45.4 50.8*  PLT 85*  --   --  88* 71*   < > = values in this interval not displayed.    ABG Recent Labs  Lab 10/20/2019 1457 10/29/19 0607 10/31/19 0356  PHART 7.417 7.506* 7.482*  PCO2ART 62.1* 53.5* 51.9*  PO2ART 51.0* 52.0* 59.0*    Coag's No results for input(s): APTT, INR in the last 168 hours.  Sepsis Markers Recent Labs  Lab 10/31/2019 0408 10/20/2019 0642 10/29/19 0548 10/30/19 0436  LATICACIDVEN 1.0  --   --   --   PROCALCITON  --  <0.10 0.18 0.86    Cardiac  Enzymes No results for input(s): TROPONINI, PROBNP in the last 168 hours.   Vickki Muff MD PGY-3 Internal Medicine Pager # [phone]   REVIEW OF SYSTEMS:    Intubated, more difficulty answering questions today

## 2019-11-01 NOTE — Progress Notes (Signed)
Recruitment done per MD.  No change in oxygenation.  Pt O2 sats still 80-83%.  RT will continue to monitor.

## 2019-11-01 NOTE — Progress Notes (Signed)
Patient has constant tremors and is desaturating to 85 .Dr. Shearon Stalls informed

## 2019-11-01 NOTE — Progress Notes (Addendum)
Patient's temperature is 101.7 and she continues to desaturate to 85 . Dr Shearon Stalls informed .Lasix given per orders. creatinine 2.4 Dr Shearon Stalls aware. and Blood cultures drawn per lab

## 2019-11-01 NOTE — Progress Notes (Signed)
[facility] Progress Note Patient Name: Heather Zimmerman DOB: January 05, 1946 MRN: [id number]   Date of Service  11/01/2019  HPI/Events of Note  Hypoxia - Sat now = 83%. CXR this AM with pulmonary congestions.   eICU Interventions  Will order: 1. Increase PEEP to 14. 2. Portable CXR STAT. 3. Trial of recruitment maneuver.      Intervention Category Major Interventions: Hypoxemia - evaluation and management;Respiratory failure - evaluation and management  Lysle Dingwall 11/01/2019, 8:20 PM

## 2019-11-01 NOTE — Progress Notes (Signed)
ET tube pulled back to 23cm per MD

## 2019-11-01 NOTE — Progress Notes (Signed)
eLink Physician-Brief Progress Note Patient Name: Heather Zimmerman DOB: 1946-03-13 MRN: [id number]   Date of Service  11/01/2019  HPI/Events of Note  CXR - No pneumothorax. Obscured LLL. Bronchoscopy?  eICU Interventions  Will ask primary team to evaluate at the bedside.      Intervention Category Major Interventions: Hypoxemia - evaluation and management  Christon Parada [place] 11/01/2019, 9:10 PM

## 2019-11-01 NOTE — Progress Notes (Signed)
Bedside bronch done by MD. Assisted by RT. Pt tolerated well.  Sample sent to lab.  RT will continue to monitor.

## 2019-11-01 NOTE — Progress Notes (Signed)
Levophed initiated per orders

## 2019-11-01 NOTE — Progress Notes (Addendum)
Pharmacy Antibiotic Note  Heather Zimmerman is a 74 y.o. female admitted from nursing home on 10/29/2019 with tachypnea and SOB.  CXR shows possible PNA, now with fever of 102.3 and trach secretions is brownish.  Pharmacy has been consulted for vancomycin and cefepime dosing.  Vancomycin initially held (last dose 1/17) but now with hypotension and fevers pharmacy to restart. Cr has trended up to 2.45 and pt has made minimal urine throughout the day. Repeat blood cultures sent. Pt previously on q48h regimen and would be due for dose tomorrow ~1000, will check level prior to dosing.   Plan: Hold vancomycin tonight - check random level tomorrow am Will redose vancomycin pending level   Height: 5\' 4"  (162.6 cm) Weight: 292 lb 12.3 oz (132.8 kg) IBW/kg (Calculated) : 54.7  Temp (24hrs), Avg:99.9 F (37.7 C), Min:98.7 F (37.1 C), Max:101.2 F (38.4 C)  Recent Labs  Lab 10/22/2019 0301 10/16/2019 0301 10/20/2019 0408 10/16/2019 1701 10/29/19 0548 10/29/19 0947 10/30/19 0803 10/31/19 0446 11/01/19 0418  WBC 5.8  --   --   --   --  3.5* 2.1* 2.1* 2.6*  CREATININE 2.47*   < >  --  2.56* 2.21*  --  1.93* 2.11* 2.45*  LATICACIDVEN  --   --  1.0  --   --   --   --   --   --    < > = values in this interval not displayed.    Estimated Creatinine Clearance: 27.3 mL/min (A) (by C-G formula based on SCr of 2.45 mg/dL (H)).    Allergies  Allergen Reactions  . Ace Inhibitors Other (See Comments)    Unknown - per [facility]    Vanc 1/15 >> Cefepime 1/15 >>  1/14 covid / flu - negative 1/14 MRSA PCR - positive 1/14 Strep pneumo Ur Ag - negative 1/14 resp panel PCR - negative 1/14 BCx - ngtd   Arrie Senate, PharmD, BCPS Clinical Pharmacist [phone] Please check AMION for all [place] numbers 11/01/2019

## 2019-11-01 NOTE — Progress Notes (Addendum)
Patient's cuff SBP 67-92 . I have informed Dr Shan Levans. I also informed him that Ms Durtschi has only had 30 mls urine after lasix

## 2019-11-01 NOTE — Consult Note (Signed)
WOC Nurse Consult Note: Patient receiving care at [facility] 2M03 Reason for Consult:"LLE diabetic ulcers" Wound type:healed wound of unknown etiology. Pressure Injury POA: Yes/No/NA Measurement:1.5cm x 2cm x no measurable depth Wound bed:100% pink  Drainage (amount, consistency, odor) no drainage on existing foam dressing. Periwound:extensive healed hypopigmented pink skin.  This is not a wound.  There is a very small 1.5 x 2cm partial thickness wound.  This is the only area requiring a foam dressing. Dressing procedure/placement/frequency:Clean left lower extremity wound and apply foam dressing.  Change every three days and PRN.   Monitor the wound area's for worsening of condition such as, Signs/symptoms of infection, Increase in size, development of or worsening of odor, development of pain, or increased pain at the affected locations.  Notify the medical team if any of these develop.  Thank you for the consult.  Discussed plan of care with the bedside nurse.  [place] nurse will not follow at this time.  Please re-consult the [place] if needed.    Austin Miles, RN, [employer]

## 2019-11-02 LAB — URINALYSIS, ROUTINE W REFLEX MICROSCOPIC
Bilirubin Urine: NEGATIVE
Glucose, UA: NEGATIVE mg/dL
Ketones, ur: 15 mg/dL — AB
Leukocytes,Ua: NEGATIVE
Nitrite: NEGATIVE
Protein, ur: >300 mg/dL — AB
Specific Gravity, Urine: >1.03 — ABNORMAL HIGH (ref 1.005–1.030)
pH: 5 (ref 5.0–8.0)

## 2019-11-02 LAB — POCT I-STAT 7, (LYTES, BLD GAS, ICA,H+H)
Acid-Base Excess: 8 mmol/L — ABNORMAL HIGH (ref 0.0–2.0)
Acid-Base Excess: 5 mmol/L — ABNORMAL HIGH (ref 0.0–2.0)
Bicarbonate: 34.9 mmol/L — ABNORMAL HIGH (ref 20.0–28.0)
Bicarbonate: 31.2 mmol/L — ABNORMAL HIGH (ref 20.0–28.0)
Calcium, Ion: 1.16 mmol/L (ref 1.15–1.40)
Calcium, Ion: 1.21 mmol/L (ref 1.15–1.40)
HCT: 51 % — ABNORMAL HIGH (ref 36.0–46.0)
HCT: 54 % — ABNORMAL HIGH (ref 36.0–46.0)
Hemoglobin: 17.3 g/dL — ABNORMAL HIGH (ref 12.0–15.0)
Hemoglobin: 18.4 g/dL — ABNORMAL HIGH (ref 12.0–15.0)
O2 Saturation: 65 %
O2 Saturation: 80 %
Patient temperature: 99.5
Patient temperature: 99.3
Potassium: 4.4 mmol/L (ref 3.5–5.1)
Potassium: 4.8 mmol/L (ref 3.5–5.1)
Sodium: 146 mmol/L — ABNORMAL HIGH (ref 135–145)
Sodium: 144 mmol/L (ref 135–145)
TCO2: 37 mmol/L — ABNORMAL HIGH (ref 22–32)
TCO2: 33 mmol/L — ABNORMAL HIGH (ref 22–32)
pCO2 arterial: 55.9 mmHg — ABNORMAL HIGH (ref 32.0–48.0)
pCO2 arterial: 51 mmHg — ABNORMAL HIGH (ref 32.0–48.0)
pH, Arterial: 7.406 (ref 7.350–7.450)
pH, Arterial: 7.396 (ref 7.350–7.450)
pO2, Arterial: 35 mmHg — CL (ref 83.0–108.0)
pO2, Arterial: 46 mmHg — ABNORMAL LOW (ref 83.0–108.0)

## 2019-11-02 LAB — CBC WITH DIFFERENTIAL/PLATELET
Abs Immature Granulocytes: 0.04 10*3/uL (ref 0.00–0.07)
Basophils Absolute: 0 10*3/uL (ref 0.0–0.1)
Basophils Relative: 0 %
Eosinophils Absolute: 0 10*3/uL (ref 0.0–0.5)
Eosinophils Relative: 0 %
HCT: 55.9 % — ABNORMAL HIGH (ref 36.0–46.0)
Hemoglobin: 16.3 g/dL — ABNORMAL HIGH (ref 12.0–15.0)
Immature Granulocytes: 1 %
Lymphocytes Relative: 12 %
Lymphs Abs: 0.7 10*3/uL (ref 0.7–4.0)
MCH: 28.3 pg (ref 26.0–34.0)
MCHC: 29.2 g/dL — ABNORMAL LOW (ref 30.0–36.0)
MCV: 97.2 fL (ref 80.0–100.0)
Monocytes Absolute: 0.6 10*3/uL (ref 0.1–1.0)
Monocytes Relative: 11 %
Neutro Abs: 4.5 10*3/uL (ref 1.7–7.7)
Neutrophils Relative %: 76 %
Platelets: 84 10*3/uL — ABNORMAL LOW (ref 150–400)
RBC: 5.75 MIL/uL — ABNORMAL HIGH (ref 3.87–5.11)
RDW: 18.6 % — ABNORMAL HIGH (ref 11.5–15.5)
WBC: 5.9 10*3/uL (ref 4.0–10.5)
nRBC: 0 % (ref 0.0–0.2)

## 2019-11-02 LAB — CULTURE, BLOOD (ROUTINE X 2)
Culture: NO GROWTH
Culture: NO GROWTH
Special Requests: ADEQUATE

## 2019-11-02 LAB — COMPREHENSIVE METABOLIC PANEL
ALT: 59 U/L — ABNORMAL HIGH (ref 0–44)
AST: 149 U/L — ABNORMAL HIGH (ref 15–41)
Albumin: 1.6 g/dL — ABNORMAL LOW (ref 3.5–5.0)
Alkaline Phosphatase: 84 U/L (ref 38–126)
Anion gap: 11 (ref 5–15)
BUN: 95 mg/dL — ABNORMAL HIGH (ref 8–23)
CO2: 30 mmol/L (ref 22–32)
Calcium: 8.2 mg/dL — ABNORMAL LOW (ref 8.9–10.3)
Chloride: 106 mmol/L (ref 98–111)
Creatinine, Ser: 3.16 mg/dL — ABNORMAL HIGH (ref 0.44–1.00)
GFR calc Af Amer: 16 mL/min — ABNORMAL LOW (ref >60)
GFR calc non Af Amer: 14 mL/min — ABNORMAL LOW (ref >60)
Glucose, Bld: 243 mg/dL — ABNORMAL HIGH (ref 70–99)
Potassium: 4.6 mmol/L (ref 3.5–5.1)
Sodium: 147 mmol/L — ABNORMAL HIGH (ref 135–145)
Total Bilirubin: 0.8 mg/dL (ref 0.3–1.2)
Total Protein: 5.9 g/dL — ABNORMAL LOW (ref 6.5–8.1)

## 2019-11-02 LAB — GLUCOSE, CAPILLARY
Glucose-Capillary: 197 mg/dL — ABNORMAL HIGH (ref 70–99)
Glucose-Capillary: 206 mg/dL — ABNORMAL HIGH (ref 70–99)
Glucose-Capillary: 226 mg/dL — ABNORMAL HIGH (ref 70–99)
Glucose-Capillary: 232 mg/dL — ABNORMAL HIGH (ref 70–99)
Glucose-Capillary: 245 mg/dL — ABNORMAL HIGH (ref 70–99)
Glucose-Capillary: 263 mg/dL — ABNORMAL HIGH (ref 70–99)
Glucose-Capillary: 269 mg/dL — ABNORMAL HIGH (ref 70–99)

## 2019-11-02 LAB — HEMOGLOBIN A1C
Hgb A1c MFr Bld: 6.7 % — ABNORMAL HIGH (ref 4.8–5.6)
Mean Plasma Glucose: 145.59 mg/dL

## 2019-11-02 LAB — URINALYSIS, MICROSCOPIC (REFLEX)
Squamous Epithelial / HPF: NONE SEEN (ref 0–5)
WBC, UA: NONE SEEN WBC/hpf (ref 0–5)

## 2019-11-02 LAB — PROCALCITONIN: Procalcitonin: 2.57 ng/mL

## 2019-11-02 LAB — VANCOMYCIN, RANDOM: Vancomycin Rm: 17

## 2019-11-02 MED ORDER — VANCOMYCIN HCL 1500 MG/300ML IV SOLN
1500.0000 mg | INTRAVENOUS | Status: DC
  Administered 2019-11-02: 1500 mg via INTRAVENOUS
  Filled 2019-11-02: qty 300

## 2019-11-02 MED ORDER — LORAZEPAM 2 MG/ML IJ SOLN: 2.0000 mg | INTRAMUSCULAR | Status: DC | PRN

## 2019-11-02 MED ORDER — INSULIN ASPART 100 UNIT/ML ~~LOC~~ SOLN
0.0000 [IU] | SUBCUTANEOUS | Status: DC
  Administered 2019-11-02 (×2): 5 [IU] via SUBCUTANEOUS
  Administered 2019-11-02 (×2): 8 [IU] via SUBCUTANEOUS
  Administered 2019-11-03: 5 [IU] via SUBCUTANEOUS
  Administered 2019-11-03: 3 [IU] via SUBCUTANEOUS
  Administered 2019-11-03: 5 [IU] via SUBCUTANEOUS
  Administered 2019-11-03: 8 [IU] via SUBCUTANEOUS
  Administered 2019-11-03: 3 [IU] via SUBCUTANEOUS

## 2019-11-02 MED ORDER — INSULIN GLARGINE 100 UNIT/ML ~~LOC~~ SOLN
6.0000 [IU] | Freq: Every day | SUBCUTANEOUS | Status: DC
  Administered 2019-11-03: 6 [IU] via SUBCUTANEOUS
  Filled 2019-11-02 (×2): qty 0.06

## 2019-11-02 MED ORDER — NOREPINEPHRINE 16 MG/250ML-% IV SOLN
0.0000 ug/min | INTRAVENOUS | Status: DC
  Administered 2019-11-02: 40 ug/min via INTRAVENOUS
  Administered 2019-11-02: 12 ug/min via INTRAVENOUS
  Administered 2019-11-03 – 2019-11-04 (×4): 40 ug/min via INTRAVENOUS
  Filled 2019-11-02 (×6): qty 250

## 2019-11-02 MED ORDER — INSULIN GLARGINE 100 UNIT/ML ~~LOC~~ SOLN
10.0000 [IU] | Freq: Every day | SUBCUTANEOUS | Status: DC
  Administered 2019-11-02: 09:00:00 10 [IU] via SUBCUTANEOUS
  Filled 2019-11-02: qty 0.1

## 2019-11-02 MED ORDER — SODIUM CHLORIDE 0.9 % IV SOLN
2.0000 g | Freq: Two times a day (BID) | INTRAVENOUS | Status: DC
  Administered 2019-11-02 – 2019-11-03 (×3): 2 g via INTRAVENOUS
  Filled 2019-11-02 (×3): qty 2

## 2019-11-02 NOTE — Progress Notes (Signed)
Spoke with sister Estill Bamberg and updated her on patient condition. Patient is continuing to decline and maxed out on levophed. Sister is aware that she may not make it through the night. Sister states that she wants her sister to be comfortable and does not want to escalate care, but is not ready for full comfort care and needs to speak with the rest of the family before making any decisions.   Patient connected to [place] and speaking to her sister.

## 2019-11-02 NOTE — Progress Notes (Signed)
I have talked with Heather Zimmerman per [name]'s request and updated her on [name] condition and that she is not responding to treatments and care given

## 2019-11-02 NOTE — Progress Notes (Signed)
I have called Ms Holzer sister Estill Bamberg ( she lives in [place]) and updated her as to the severity of her sister's condition . She has asked me to call her niece Ellery Plunk who is a Marine scientist in [place] so she can also explain and help her understand. I have left a message with Megan [phone] to call back.

## 2019-11-02 NOTE — Progress Notes (Signed)
Nutrition Follow-up / Consult  DOCUMENTATION CODES:   Morbid obesity  INTERVENTION:   Continue TF via OG tube:   Vital High Protein at 30 ml/h (720 ml per day)   Pro-stat 30 ml QID   Provides 1120 kcal, 123 gm protein, 602 ml free water daily  NUTRITION DIAGNOSIS:   Inadequate oral intake related to inability to eat as evidenced by NPO status.  Ongoing  GOAL:   Provide needs based on ASPEN/SCCM guidelines   Met with TF  MONITOR:   TF tolerance, Skin, Vent status, Labs  REASON FOR ASSESSMENT:   Ventilator, Consult Enteral/tube feeding initiation and management  ASSESSMENT:   74 yo female admitted with respiratory distress, obtundation, requiring intubation. PMH includes R AKA, morbid obesity, COPD, baseline hypercapnia on BiPAP nightly, DM-2, HF, HTN, vitamin D deficiency, venous stasis ulcers, OSA, CKD.   Received MD Consult for TF initiation and management. Patient is currently receiving Vital High Protein at 30 ml/h with Pro-stat 30 ml QID, tolerating without difficulty.   Patient decompensated over night, remains intubated on ventilator support, requiring one pressor. Code status changed to partial (no CPR, medications, shock, or initiate code blue). MV: 11.5 L/min Temp (24hrs), Avg:99.8 F (37.7 C), Min:98.4 F (36.9 C), Max:101.7 F (38.7 C)  Propofol: off  Labs reviewed.  CBG's: [phone]  Medications reviewed and include novolog, lantus, levophed, precedex, fentanyl.   Diet Order:   Diet Order            Diet NPO time specified  Diet effective now              EDUCATION NEEDS:   Not appropriate for education at this time  Skin:  Skin Assessment: Skin Integrity Issues: Skin Integrity Issues:: Other (Comment) Other: L leg arterial ulcer  Last BM:  1/19 type 7  Height:   Ht Readings from Last 1 Encounters:  10/27/2019 '5\' 4"'$  (1.626 m)    Weight:   Wt Readings from Last 1 Encounters:  11/02/19 135.7 kg    Ideal Body  Weight:  50.1 kg  BMI:  53 (adjusted for AKA)  Estimated Nutritional Needs:   Kcal:  1100-1250  Protein:  115-125 gm  Fluid:  >/= 1.5 L    Molli Barrows, RD, LDN, [place] Pager [phone] After Hours Pager [phone]

## 2019-11-02 NOTE — Progress Notes (Addendum)
PULMONARY / CRITICAL CARE MEDICINE   NAME:  Heather Zimmerman, MRN:  [id number], DOB:  1946-05-24, LOS: 5 ADMISSION DATE:  10/20/2019, CONSULTATION DATE:  10/23/2019 REFERRING MD:  ED, CHIEF COMPLAINT:  Hypercapnic respiratory failure  BRIEF HISTORY:    Patient is a 74 year old female nursing home patient morbidly obese with a BMI of greater than 48 history of COPD obstructive sleep apnea on BiPAP at night with peripheral vascular disease and history of right BKA in 2013 presents to the emergency room with a pH of 7.008 PCO2 of greater than 120 bicarb of 40. She was disoriented with progressive deterioration in her respiratory status and mental status requiring intubation despite initial ventilation with BiPAP.  Chest x-ray suggests some pulmonary edema.  Last ejection fraction was 60% in 2019 suggesting possible diastolic dysfunction.   SIGNIFICANT PAST MEDICAL HISTORY    . Acute respiratory failure ([place]) 03/2018  . Chronic diastolic heart failure ([place]) 12/31/2012  . Chronic pain 12/31/2012  . CKD (chronic kidney disease), stage III ([place])   . Diabetes mellitus without complication ([place])    TYPE 2  . Essential hypertension, benign 12/31/2012  . GERD 12/31/2012  . Obstructive sleep apnea 07/05/2014  . Shortness of breath dyspnea   . 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 Unspecified vitamin D deficiency 12/31/2012  . Venous insufficiency (chronic) (peripheral)   . Venous stasis ulcers ([place])      SIGNIFICANT EVENTS:  10/27/2019 ICU Admission 10/29/19 AM Febrile, Ventilated 10/30/19: Kidney function improving, worsening thrombocytopenia HIT panel ordered, unable to reach family for [place] discussion. 10/31/19: increased awareness, Continued ventilation 80% FiO2, reach out to family for [place] due to failure on at home bipap.  11/01/19: Tremors/neurological status worse today, spiked fever to 101, renal function  worsening, O2 requirements increasing, trial of diuresis 1/19: Decompensated O/N. Bronchoscopy performed, fevers O/N 101. Code status Partial.  STUDIES:   10/31/19: VAS [place] LE VENOUS BILAT Summary: Right: There is no evidence of deep vein thrombosis in the lower extremity. However, portions of this examination were limited- see technologist comments above. Left: There is no evidence of deep vein thrombosis in the lower extremity. However, portions of this examination were limited- see technologist comments above. Risk Factors: Right BKA. Limitations: Body habitus and bandages. Comparison Study: No prior study on file for comparison.  1/16 HIT panel negative  CULTURES:  10/15/2019: Blood cultures: NGTD Final 1/14: RVP Negative  1/14: Respiratory Culture negative 1/14: MRSA PCR: Positive 1/14: Respiratory culture: negative 1/18: Trach Aspirate (BAL): Rare gram + Cocci, few WBC 1/18: Blood Cultures: NGTD 1/19: U/A: Pending ANTIBIOTICS:  1/15 Vanc>1/17 1/15 Cefepime>1/20 1/18 Vanc> LINES/TUBES:  1/14 Peripheral IV L Forearm 1/14: NG Tube 1/14: 7.5 mm Airway  1/14: LIJ 1/14: R Arterial Line CONSULTANTS:  PCCM SUBJECTIVE:  O/N: Decompensated O/N. Bronchoscopy last night, no mucus plugs noted.  S: Patient ventilated, not following commands this morning.  CONSTITUTIONAL: BP 100/64   Pulse 80   Temp (!) 101.7 F (38.7 C) (Oral)   Resp (!) 0   Ht _0  (1.626 m)   Wt 135.7 kg   LMP  (LMP Unknown)   SpO2 (!) 78%   BMI 51.35 kg/m   I/O last 3 completed shifts: In: 1858 [I.V.:188.2; NG/GT:1070; IV Piggyback:599.8] Out: 1280 [Urine:1280]  CVP:  [5 mmHg] 5 mmHg  Vent Mode: PRVC FiO2 (%):  [100 %] 100 % Set Rate:  [18 bmp-25 bmp] 25 bmp  Vt Set:  [430 mL-450 mL] 450 mL PEEP:  [14 cmH20-16 cmH20] 16 cmH20 Plateau Pressure:  [29 cmH20-35 cmH20] 35 cmH20  PHYSICAL EXAM: General:  Patient not alert, non diaphoretic, sedated.  Neuro:  Sedated, not following commands or  responding to verbal stimuli, withdrawals from noxious stimuli. HEENT:  Normocephalic, atraumatic, intubated, no scleral icterus noted bilaterally.  Cardiovascular: RRR, no murmurs, rubs, or gallops. Lungs: Clear to auscultation bilaterally, no wheezes or rhonchi. Abdomen:  Distended due to habitus, BS +, non tender to palpation.  Musculoskeletal:  R BKA no wound, well bandaged L lower extremity with no erythema or purulent drainage noted.  Skin:  Dry and warm.  RESOLVED PROBLEM LIST   ASSESSMENT AND PLAN    Hypercapnic Respiratory Failure: Patient with OSA, COPD, Diastolic Heart Failure she is on bipap at home. Appears that her CO2 normal level is in the 50-60 range from previous hospitalization a year ago.  - Further decompensated O/N. Bronch performed and aspirate sent for cultures.  - Continue to wean peep/fio2 as able.  - Ongoing goals of care discussion with family. PCCM spoke with family last night. Her code status has changed to Partial with no CPR, rapid response notifications, shock, or medications if Heather Zimmerman were to change.   Shock w/ Associated Transaminitis:  Patient with low MAP scores (40s) upon arrival. She was started on levophed with improvement. She has spiked a fever 1/15 with leukopenia, ? 2/2 Pna  Seen on chest radiograph. MRSA +. Likely septic given febrile events and leukocytopenia, source of infection still under investigation.  - Cultures: NGTD - U/A ordered - Leukopenia improving: WBC 5.9 - Continue Levophed. - Continue Vancomycin - Continue Cefepime (1/20) - Procalcitonin: 2.57 - Transaminitis: AST/ALT 149/59 from 54/21, likely 2/2 to   Thrombocytopenia:   Platelet count stabilizing, HIT panel from 1/16 negative, possibly from critical illness/infection - Continue monitoring - Platelets: 84  AKI on CKD Stage III: Cr level worsening over the last several days/  - Cr: 3.16 - may be a component of vascular congestion trend BMP and correcting  electrolyte abnormalities. - Avoid nephrotoxic drugs.  - Input 1.9L/ Urine output 550 mL/ Net +1.4L   DM:  - Continue CBG Checks - Increase SSI (0-15U) - D/C Lantus  COPD:  - Continue Proventil, Brovana, Pulmicort  OSA/OHS: Patient with home BiPAP use. Initially started on BiPAP in the ED, but decompensated and needed intubation.  -less likely that she will wean well from vent back to bipap and likely failed bipap in her facility ongoing family discussions possibility of trach depending on clinical course.  GERD:  - Continue Protonix.   Best Practice / Goals of Care / Disposition.   DVT PROPHYLAXIS: Heparin  BMW:[id number]  NUTRITION:NPO MOBILITY:Bedrest GOALS OF CARE: PARTIAL (No CPR, medications, shock, or initiate code blue) FAMILY DISCUSSIONS: Ongoing discussion with Attending.  DISPOSITION ICU  LABS  Glucose Recent Labs  Lab 11/01/19 0739 11/01/19 1205 11/01/19 1535 11/01/19 1912 11/02/19 0003 11/02/19 0308  GLUCAP 167* 143* 161* 197* 197* 232*    BMET Recent Labs  Lab 10/31/19 0446 10/31/19 0446 11/01/19 0418 11/01/19 0418 11/01/19 0931 11/01/19 2320 11/02/19 0419  NA 146*   < > 147*   < > 147* 146* 147*  K 3.8   < > 4.1   < > 4.0 4.4 4.6  CL 102  --  103  --   --   --  106  CO2 34*  --  34*  --   --   --  30  BUN 65*  --  78*  --   --   --  95*  CREATININE 2.11*  --  2.45*  --   --   --  3.16*  GLUCOSE 182*  --  214*  --   --   --  243*   < > = values in this interval not displayed.    Liver Enzymes Recent Labs  Lab 10/31/19 0446 11/01/19 0418 11/02/19 0419  AST 45* 54* 149*  ALT 19 21 59*  ALKPHOS 39 51 84  BILITOT 0.9 0.7 0.8  ALBUMIN 1.7* 1.7* 1.6*    Electrolytes Recent Labs  Lab 10/24/2019 4818 10/22/2019 1701 10/29/19 0548 10/29/19 0548 10/30/19 0803 10/30/19 0803 10/31/19 0446 11/01/19 0418 11/02/19 0419  CALCIUM  --    < > 8.0*   < > 7.8*   < > 7.8* 8.2* 8.2*  MG 2.0  --  1.9  --   --   --   --   --   --   PHOS  --   --   1.8*  --  3.3  --   --   --   --    < > = values in this interval not displayed.    CBC Recent Labs  Lab 10/31/19 0446 10/31/19 0446 11/01/19 0418 11/01/19 0418 11/01/19 0931 11/01/19 2320 11/02/19 0419  WBC 2.1*  --  2.6*  --   --   --  5.9  HGB 13.0   < > 14.2   < > 15.6* 17.3* 16.3*  HCT 45.4   < > 50.8*   < > 46.0 51.0* 55.9*  PLT 88*  --  71*  --   --   --  84*   < > = values in this interval not displayed.    ABG Recent Labs  Lab 10/31/19 0356 11/01/19 0931 11/01/19 2320  PHART 7.482* 7.412 7.406  PCO2ART 51.9* 55.9* 55.9*  PO2ART 59.0* 59.0* 35.0*    Coag's No results for input(s): APTT, INR in the last 168 hours.  Sepsis Markers Recent Labs  Lab 10/20/2019 0408 10/25/2019 5631 10/30/19 0436 11/01/19 0744 11/02/19 0419  LATICACIDVEN 1.0  --   --   --   --   PROCALCITON  --    < > 0.86 1.48 2.57   < > = values in this interval not displayed.    Cardiac Enzymes No results for input(s): TROPONINI, PROBNP in the last 168 hours.   Maudie Mercury, MD IMTS, PGY-1 Pager: [phone] 11/02/2019,6:45 AM   REVIEW OF SYSTEMS:    Intubated

## 2019-11-02 NOTE — Progress Notes (Signed)
Saturation in the 70's despite increasing peep to 16 and performing bronchoscopy. Ground team is at bedside again.

## 2019-11-02 NOTE — Progress Notes (Signed)
Pharmacy Antibiotic Note  Heather Zimmerman is a 74 y.o. female admitted from nursing home on 10/25/2019 with tachypnea and SOB.  CXR shows possible PNA with fever and trach secretions is brownish.  Pharmacy has been consulted for vancomycin and cefepime dosing. Blood cultures negative, but MRSA PCR positive and BAL for gram and culture sent. Scr trended up from 2.45 to 3.16 with 525m urine output on 1/18. Vancomycin held after 1/17 ~1000 dose. Vancomycin trough drawn today at 1/19 0900 was 17 (therapeutic).   Plan: -Continue vancomycin dose of 1500 mg q48h -Monitor renal function and adjust vancomycin dose if renal function worsens.   Height: _0  (162.6 cm) Weight: 299 lb 2.6 oz (135.7 kg) IBW/kg (Calculated) : 54.7  Temp (24hrs), Avg:100 F (37.8 C), Min:98.4 F (36.9 C), Max:101.7 F (38.7 C)  Recent Labs  Lab 11/08/2019 0301 11/13/2019 0408 10/18/2019 1701 10/29/19 0548 10/29/19 0947 10/30/19 0803 10/31/19 0446 11/01/19 0418 11/02/19 0419 11/02/19 0900  WBC   < >  --   --   --  3.5* 2.1* 2.1* 2.6* 5.9  --   CREATININE  --   --    < > 2.21*  --  1.93* 2.11* 2.45* 3.16*  --   LATICACIDVEN  --  1.0  --   --   --   --   --   --   --   --   VANCORANDOM  --   --   --   --   --   --   --   --   --  17   < > = values in this interval not displayed.    Estimated Creatinine Clearance: 21.5 mL/min (A) (by C-G formula based on SCr of 3.16 mg/dL (H)).    Allergies  Allergen Reactions  . Ace Inhibitors Other (See Comments)    [facility]   Vanc 1/15 >> Cefepime 1/15 >>  1/14 covid / flu - negative 1/14 MRSA PCR - positive 1/14 Strep pneumo Ur Ag - negative 1/14 resp panel PCR - negative 1/14 BCx - ngtd   BErick Blinks PharmD Candidate [facility]  11/02/2019

## 2019-11-02 NOTE — Progress Notes (Signed)
RN alerted E-link MD due to saturations dropping lower than she was before. E-link MD gave orders to increase peep and RT was notified to make the adjustments , however, no improvement in saturation. Ground team notified and are at bedside ready to perform bronchoscopy procedure.

## 2019-11-02 NOTE — Progress Notes (Signed)
ABG results reported to Dr Shearon Stalls, per MD no new RT/vent orders.

## 2019-11-02 NOTE — Progress Notes (Signed)
[facility] Progress Note Patient Name: Heather Zimmerman DOB: 1946-05-30 MRN: [id number]   Date of Service  11/02/2019  HPI/Events of Note  Bedside RN calling - ongoing "jerking movements" (?myoclonus) throughout the day. No obvious seizures.  Notes reviewed, pt DNR, family considering transition to comfort. RN spoke to family via phone who are not ready for comfort measures tonight but do not want to escalate care in any way. They will video chat and discuss further in am.  Per RN they are aware she may not survive the night and could die on the vent.   eICU Interventions  PRN ativan if further ?seizure activity Further family discussions in am  No escalation of care      Intervention Category Minor Interventions: Routine modifications to care plan (e.g. PRN medications for pain, fever)  Darlina Sicilian 11/02/2019, 9:39 PM

## 2019-11-03 DIAGNOSIS — I739 Peripheral vascular disease, unspecified: Secondary | ICD-10-CM

## 2019-11-03 DIAGNOSIS — J9622 Acute and chronic respiratory failure with hypercapnia: Principal | ICD-10-CM

## 2019-11-03 LAB — CBC WITH DIFFERENTIAL/PLATELET
Abs Immature Granulocytes: 0.08 10*3/uL — ABNORMAL HIGH (ref 0.00–0.07)
Basophils Absolute: 0 10*3/uL (ref 0.0–0.1)
Basophils Relative: 0 %
Eosinophils Absolute: 0 10*3/uL (ref 0.0–0.5)
Eosinophils Relative: 0 %
HCT: 60.4 % — ABNORMAL HIGH (ref 36.0–46.0)
Hemoglobin: 16.7 g/dL — ABNORMAL HIGH (ref 12.0–15.0)
Immature Granulocytes: 1 %
Lymphocytes Relative: 15 %
Lymphs Abs: 1.2 10*3/uL (ref 0.7–4.0)
MCH: 27.6 pg (ref 26.0–34.0)
MCHC: 27.6 g/dL — ABNORMAL LOW (ref 30.0–36.0)
MCV: 99.7 fL (ref 80.0–100.0)
Monocytes Absolute: 0.8 10*3/uL (ref 0.1–1.0)
Monocytes Relative: 9 %
Neutro Abs: 5.9 10*3/uL (ref 1.7–7.7)
Neutrophils Relative %: 75 %
Platelets: 126 10*3/uL — ABNORMAL LOW (ref 150–400)
RBC: 6.06 MIL/uL — ABNORMAL HIGH (ref 3.87–5.11)
RDW: 19.3 % — ABNORMAL HIGH (ref 11.5–15.5)
WBC: 8 10*3/uL (ref 4.0–10.5)
nRBC: 0 % (ref 0.0–0.2)

## 2019-11-03 LAB — CULTURE, RESPIRATORY W GRAM STAIN: Culture: NO GROWTH

## 2019-11-03 LAB — COMPREHENSIVE METABOLIC PANEL
ALT: 92 U/L — ABNORMAL HIGH (ref 0–44)
AST: 213 U/L — ABNORMAL HIGH (ref 15–41)
Albumin: 1.3 g/dL — ABNORMAL LOW (ref 3.5–5.0)
Alkaline Phosphatase: 71 U/L (ref 38–126)
Anion gap: 12 (ref 5–15)
BUN: 125 mg/dL — ABNORMAL HIGH (ref 8–23)
CO2: 25 mmol/L (ref 22–32)
Calcium: 8.3 mg/dL — ABNORMAL LOW (ref 8.9–10.3)
Chloride: 107 mmol/L (ref 98–111)
Creatinine, Ser: 4.57 mg/dL — ABNORMAL HIGH (ref 0.44–1.00)
GFR calc Af Amer: 10 mL/min — ABNORMAL LOW (ref >60)
GFR calc non Af Amer: 9 mL/min — ABNORMAL LOW (ref >60)
Glucose, Bld: 296 mg/dL — ABNORMAL HIGH (ref 70–99)
Potassium: 5.2 mmol/L — ABNORMAL HIGH (ref 3.5–5.1)
Sodium: 144 mmol/L (ref 135–145)
Total Bilirubin: 0.8 mg/dL (ref 0.3–1.2)
Total Protein: 5.7 g/dL — ABNORMAL LOW (ref 6.5–8.1)

## 2019-11-03 LAB — GLUCOSE, CAPILLARY
Glucose-Capillary: 282 mg/dL — ABNORMAL HIGH (ref 70–99)
Glucose-Capillary: 240 mg/dL — ABNORMAL HIGH (ref 70–99)
Glucose-Capillary: 222 mg/dL — ABNORMAL HIGH (ref 70–99)
Glucose-Capillary: 194 mg/dL — ABNORMAL HIGH (ref 70–99)
Glucose-Capillary: 151 mg/dL — ABNORMAL HIGH (ref 70–99)
Glucose-Capillary: 110 mg/dL — ABNORMAL HIGH (ref 70–99)

## 2019-11-03 MED ORDER — ACETAMINOPHEN 160 MG/5ML PO SOLN
650.0000 mg | ORAL | Status: DC | PRN
  Administered 2019-11-03 (×2): 650 mg
  Filled 2019-11-03 (×2): qty 20.3

## 2019-11-03 NOTE — Progress Notes (Signed)
PULMONARY / CRITICAL CARE MEDICINE   NAME:  Heather Zimmerman, MRN:  [id number], DOB:  Apr 23, 1946, LOS: 6 ADMISSION DATE:  10/27/2019, CONSULTATION DATE:  10/17/2019 REFERRING MD:  ED, CHIEF COMPLAINT:  Hypercapnic respiratory failure  BRIEF HISTORY:    Patient is a 74 year old female nursing home patient morbidly obese with a BMI of greater than 48 history of COPD obstructive sleep apnea on BiPAP at night with peripheral vascular disease and history of right BKA in 2013 presents to the emergency room with a pH of 7.008 PCO2 of greater than 120 bicarb of 40. She was disoriented with progressive deterioration in her respiratory status and mental status requiring intubation despite initial ventilation with BiPAP.  Chest x-ray suggests some pulmonary edema.  Last ejection fraction was 60% in 2019 suggesting possible diastolic dysfunction.   SIGNIFICANT PAST MEDICAL HISTORY    . Acute respiratory failure ([place]) 03/2018  . Chronic diastolic heart failure ([place]) 12/31/2012  . Chronic pain 12/31/2012  . CKD (chronic kidney disease), stage III ([place])   . Diabetes mellitus without complication ([place])    TYPE 2  . Essential hypertension, benign 12/31/2012  . GERD 12/31/2012  . Obstructive sleep apnea 07/05/2014  . Shortness of breath dyspnea   . 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 Unspecified vitamin D deficiency 12/31/2012  . Venous insufficiency (chronic) (peripheral)   . Venous stasis ulcers ([place])      SIGNIFICANT EVENTS:  10/23/2019 ICU Admission 10/29/19 AM Febrile, Ventilated. 10/30/19: Kidney function improving, worsening thrombocytopenia HIT panel ordered, unable to reach family for [place] discussion. 10/31/19: increased awareness, Continued ventilation 80% FiO2, reach out to family for [place] due to failure on at home bipap.  11/01/19: Tremors/neurological status worse today, spiked fever to 101, renal function  worsening, O2 requirements increasing, trial of diuresis. 1/19: Decompensated O/N. Bronchoscopy performed, fevers O/N 101. Code status Partial.  1/20: Further decompensation, RN spoke with family. They do not want escalation of care, but are deciding on comfort care measures.  STUDIES:   10/31/19: VAS [place] LE VENOUS BILAT Summary: Right: There is no evidence of deep vein thrombosis in the lower extremity. However, portions of this examination were limited- see technologist comments above. Left: There is no evidence of deep vein thrombosis in the lower extremity. However, portions of this examination were limited- see technologist comments above. Risk Factors: Right BKA. Limitations: Body habitus and bandages. Comparison Study: No prior study on file for comparison.  1/16 HIT panel negative  CULTURES:  10/19/2019: Blood cultures: NGTD Final 1/14: RVP Negative  1/14: Respiratory Culture negative 1/14: MRSA PCR: Positive 1/14: Respiratory culture: negative 1/18: Trach Aspirate (BAL): Rare gram + Cocci, few WBC 1/18: Blood Cultures: NGTD 1/19: U/A: Pending ANTIBIOTICS:  1/15 Vanc>1/17 1/15 Cefepime>1/20 1/18 Vanc> LINES/TUBES:  1/14 Peripheral IV L Forearm 1/14: NG Tube 1/14: 7.5 mm Airway  1/14: LIJ 1/14: R Arterial Line CONSULTANTS:  PCCM SUBJECTIVE:  O/N: Maxed out on levophed, continued family discussions they do not want escalation in care, are continuing discussions for comfort care.  S: Patient ventilated, Non responsive to commands.  CONSTITUTIONAL: BP 95/73   Pulse (!) 144   Temp (!) 100.7 F (38.2 C) (Oral)   Resp (!) 25   Ht _0  (1.626 m)   Wt 133.4 kg   LMP  (LMP Unknown)   SpO2 (!) 61%   BMI 50.48 kg/m   I/O last 3 completed shifts: In: 4128.1 [  I.V.:2358.2; NG/GT:1170; IV Piggyback:599.9] Out: 415 [Urine:415]     Vent Mode: PRVC FiO2 (%):  [100 %] 100 % Set Rate:  [25 bmp] 25 bmp Vt Set:  [450 mL] 450 mL PEEP:  [16 cmH20] 16 cmH20 Plateau Pressure:   [24 cmH20-39 cmH20] 39 cmH20  PHYSICAL EXAM: General:  Patient not alert, sedated, ill appearing  Neuro:  Sedated, not following commands or responding to verbal stimuli.  HEENT:  Normocephalic, atraumatic, intubated, no scleral icterus noted bilaterally.  Cardiovascular: RRR, no murmurs, rubs, or gallops. Lungs: Clear to auscultation bilaterally, no wheezes or rhonchi. Abdomen:  Distended due to habitus, BS +, non tender to palpation.  Musculoskeletal:  R BKA no wound, well bandaged L lower extremity with no erythema or purulent drainage noted.  Skin:  Dry and warm.   RESOLVED PROBLEM LIST   ASSESSMENT AND PLAN   74 y/o female with PMH of OSA, DMT2, HTN, GERD, CKD who is being treated for respiratory failure and septic shock.   Hypercapnic Respiratory Failure:  Patient with OSA, COPD, Diastolic Heart Failure she is on bipap at home. Appears that her CO2 normal level is in the 50-60 range from previous hospitalization a year ago.  - Further decompensated O/N. Bronch performed and aspirate sent for cultures.  - Ongoing goals of care discussion with family. Family does not want escalation of care, and are currently discussing comfort care measures.    Shock w/ Associated Transaminitis:  Patient with low MAP scores (40s) upon arrival. She was started on levophed with improvement. She has spiked a fever 1/15 with leukopenia, ? 2/2 Pna  Seen on chest radiograph. MRSA +. Likely septic given febrile events and leukocytopenia, source of infection still under investigation.  - Cultures: NGTD - U/A ordered - Leukopenia improving: WBC 8.0 - Continue Levophed. - Continue Vancomycin. - Continue Cefepime (1/20). - Transaminitis: AST/ALT 213/92 from 149/59, likely 2/2 to end organ damage.  Thrombocytopenia:   Platelet count stabilizing, HIT panel from 1/16 negative, possibly from critical illness/infection - Continue monitoring - Platelets: 126  AKI on CKD Stage III: Cr level worsening over  the last several days. - Cr: 4.57 - May be a component of vascular congestion trend BMP and correcting electrolyte abnormalities. - Avoid nephrotoxic drugs.  - Input 2.9L/ Urine output 175 mL/ Net +2.7L   DM:  - Continue CBG Checks - Increase SSI (0-15U) - D/C Lantus   COPD:  - Continue Proventil, Brovana, Pulmicort  OSA/OHS:  Patient with home BiPAP use. Initially started on BiPAP in the ED, but decompensated and needed intubation.  - Less likely that she will wean well from vent back to bipap and likely failed bipap in her facility ongoing family discussions possibility of trach depending on clinical course.  GERD:  - Continue Protonix.   Best Practice / Goals of Care / Disposition.   DVT PROPHYLAXIS: Heparin  SUP: Protonix  NUTRITION: NPO MOBILITY:Bedrest GOALS OF CARE: PARTIAL (No CPR, medications, shock, or initiate code blue) FAMILY DISCUSSIONS: Ongoing discussion with Attending.  DISPOSITION ICU  LABS  Glucose Recent Labs  Lab 11/02/19 1122 11/02/19 1556 11/02/19 2024 11/02/19 2338 11/03/19 0338 11/03/19 0705  GLUCAP 226* 245* 263* 269* 282* 240*    BMET Recent Labs  Lab 11/01/19 0418 11/01/19 0931 11/02/19 0419 11/02/19 0951 11/03/19 0345  NA 147*   < > 147* 144 144  K 4.1   < > 4.6 4.8 5.2*  CL 103  --  106  --  107  CO2 34*  --  30  --  25  BUN 78*  --  95*  --  125*  CREATININE 2.45*  --  3.16*  --  4.57*  GLUCOSE 214*  --  243*  --  296*   < > = values in this interval not displayed.    Liver Enzymes Recent Labs  Lab 11/01/19 0418 11/02/19 0419 11/03/19 0345  AST 54* 149* 213*  ALT 21 59* 92*  ALKPHOS 51 84 71  BILITOT 0.7 0.8 0.8  ALBUMIN 1.7* 1.6* 1.3*    Electrolytes Recent Labs  Lab 10/17/2019 4196 10/30/2019 1701 10/29/19 0548 10/29/19 0548 10/30/19 0803 10/31/19 0446 11/01/19 0418 11/02/19 0419 11/03/19 0345  CALCIUM  --    < > 8.0*   < > 7.8*   < > 8.2* 8.2* 8.3*  MG 2.0  --  1.9  --   --   --   --   --   --   PHOS   --   --  1.8*  --  3.3  --   --   --   --    < > = values in this interval not displayed.    CBC Recent Labs  Lab 11/01/19 0418 11/01/19 0931 11/02/19 0419 11/02/19 0951 11/03/19 0345  WBC 2.6*  --  5.9  --  8.0  HGB 14.2   < > 16.3* 18.4* 16.7*  HCT 50.8*   < > 55.9* 54.0* 60.4*  PLT 71*  --  84*  --  126*   < > = values in this interval not displayed.    ABG Recent Labs  Lab 11/01/19 0931 11/01/19 2320 11/02/19 0951  PHART 7.412 7.406 7.396  PCO2ART 55.9* 55.9* 51.0*  PO2ART 59.0* 35.0* 46.0*    Coag's No results for input(s): APTT, INR in the last 168 hours.  Sepsis Markers Recent Labs  Lab 11/11/2019 0408 10/29/2019 2229 10/30/19 0436 11/01/19 0744 11/02/19 0419  LATICACIDVEN 1.0  --   --   --   --   PROCALCITON  --    < > 0.86 1.48 2.57   < > = values in this interval not displayed.    Cardiac Enzymes No results for input(s): TROPONINI, PROBNP in the last 168 hours.   Maudie Mercury, MD IMTS, PGY-1 Pager: [phone] 11/03/2019,8:12 AM   REVIEW OF SYSTEMS:    Intubated

## 2019-11-03 NOTE — Progress Notes (Signed)
eLink Physician-Brief Progress Note Patient Name: Caleesi Glatt DOB: 1946/07/17 MRN: [id number]   Date of Service  11/03/2019  HPI/Events of Note  Pt with multiple liquid stools, although she has a temperature her white count is 8, C-DIff seems unlikely. She's at high risk for skin breakdown due to her weight and body habitus.  eICU Interventions  Rectal tube ordered PRN need.        Kerry Kass Carman Essick 11/03/2019, 11:19 PM

## 2019-11-03 NOTE — Progress Notes (Signed)
[facility] Progress Note Patient Name: Heather Zimmerman DOB: 08-26-1946 MRN: [id number]   Date of Service  11/03/2019  HPI/Events of Note  Temp up to 103.2, current Tylenol order is for 325 mg Q 6 hours PRN, Pt with modestly increased LFT's. Pt is DNR at this point pending discussions about de-escalating care.  eICU Interventions  Tylenol order changed to 650 mg po Q 4 hours PRN        Kerry Kass Kimarion Chery 11/03/2019, 2:17 AM

## 2019-11-06 LAB — CULTURE, BLOOD (ROUTINE X 2)
Culture: NO GROWTH
Culture: NO GROWTH
Special Requests: ADEQUATE
Special Requests: ADEQUATE

## 2019-11-08 ENCOUNTER — Telehealth: Payer: Self-pay

## 2019-11-08 NOTE — Telephone Encounter (Signed)
Received a phone call from [facility].  I explained to the lady on the phone that the dc should be signed by Doctor Shearon Stalls and told her that the dc will need to be taken to [facility].

## 2019-11-15 NOTE — Death Summary Note (Signed)
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 year old female who presented initially on 2019/11/14 reports of altered mental status and hypoxemia.  She was found to be hypercarbic and hypoxemic in the emergency department.  She also has a history of peripheral vascular disease notes history of right BKA in 12-11-2011.  She had obesity hypoventilation syndrome and is on nocturnal BiPAP at her nursing home.  She had a similar episode resulting in sepsis and prolonged hospital stay in January 2020.  Since then she was being treated as an outpatient for her major depressive disorder.  After admission to the ICU on November 13, 2022 she began progressively febrile and more hypoxemic and hypercarbic.  She was intubated on 1/15 and continued to require maximum ventilator settings.  Despite aggressive antibiotic therapy with vancomycin and piperacillin tazobactam, she continued to decline and became more hypoxemic.  She had a bronchoscopy with BAL which did not reveal a cause for her respiratory failure.  She was diuresed with IV Lasix which did not improve the situation.  She developed  progressive septic shock and was on multiple vasoactive agents.  Her blood cultures and urine cultures were no growth.  Over the next 5 days her condition worsened including her kidney injury and her mental status.  Multiple goals of care conversations were had with the patient sisters but they were not in town.  She was made DNR 1/19 and the family wanted to continue the current level of care without transition to comfort.  She expired subsequently in the early morning of 2022/11/20 at approximately 2 AM based on chart review.  Cause of death 1. Septic shock 2. 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 Type 2 diabetes mellitus 5.Chronic kidney disease 6.Heart failure with preserved ejection fraction  Pertinent Labs and Studies  Significant Diagnostic Studies DG CHEST PORT 1 VIEW  Result Date: 11/01/2019 CLINICAL DATA:  Hypoxia EXAM: PORTABLE CHEST 1 VIEW COMPARISON:  11/01/2019 at 6:01 a.m. FINDINGS: The left apex is partially obscured by the patient's facial tissues. Left internal jugular central line tip: SVC. Atherosclerotic calcification of the aortic arch. The patient is rotated to the left on today's radiograph, reducing diagnostic sensitivity and specificity. Nasogastric tube terminates in the stomach. An endotracheal tube terminates 3.6 cm above the carina. Hazy airspace opacity obscuring the left hemidiaphragm and left heart border paddle with airspace opacity in the lingula and left lower lobe. Indistinct airspace opacity at the right lung base similar to prior. Bilateral interstitial accentuation. Atherosclerotic calcification of the aortic arch. IMPRESSION: 1. Stable bibasilar airspace opacities and bilateral diffuse interstitial accentuation. 2. Tubes and lines appear satisfactorily positioned. 3.  Aortic Atherosclerosis (ICD10-I70.0). Electronically Signed   By: Thayer Jew  Janeece Fitting M.D.   On: 11/01/2019 20:51    DG Chest Portable 1 View  Result Date: 11/01/2019 CLINICAL DATA:  Check endotracheal tube EXAM: PORTABLE CHEST 1 VIEW COMPARISON:  10/29/2019 FINDINGS: Cardiac shadow is stable. Aortic calcifications are again seen. Left jugular central line is noted in the proximal superior vena cava. Endotracheal tube and gastric catheter are again noted in satisfactory position. Central vascular congestion and edema is again identified but slightly improved when compared with the prior exam. Bony abnormality is noted. IMPRESSION: Slight improvement in central vascular congestion and pulmonary edema. Electronically Signed   By: Inez Catalina M.D.   On: 11/01/2019 08:14   DG CHEST PORT 1 VIEW  Result Date: 10/29/2019 CLINICAL DATA:  Intubation. EXAM: PORTABLE CHEST 1 VIEW COMPARISON:  11/02/2019. FINDINGS: Endotracheal tube, NG tube, left IJ line stable position. Stable cardiomegaly. Bilateral multifocal pulmonary infiltrates/edema again noted. Slight interim progression from prior exam. No prominent pleural effusion. No pneumothorax. IMPRESSION: 1.  Lines and tubes stable position. 2.  Stable cardiomegaly. 3. Bilateral multifocal pulmonary infiltrates/edema again noted with slight interim progression from prior exam. Electronically Signed   By: [place]   On: 10/29/2019 06:36   DG CHEST PORT 1 VIEW  Result Date: 11/10/2019 CLINICAL DATA:  Central line placement EXAM: PORTABLE CHEST 1 VIEW COMPARISON:  Portable exam 1142 hours compared to 0619 hours FINDINGS: Tip of endotracheal tube projects 3.5 cm above carina. Nasogastric tube coiled in proximal stomach. LEFT jugular line with tip projecting over SVC. Enlargement of cardiac silhouette with pulmonary vascular congestion. Atherosclerotic calcification aorta. Infiltrates identified in both lungs, greater in RIGHT upper lobe and LEFT lower lobe. No pleural effusion or pneumothorax. IMPRESSION: No pneumothorax following LEFT jugular line placement. Persistent  pulmonary infiltrates. Aortic Atherosclerosis (ICD10-I70.0). Electronically Signed   By: Lavonia Dana M.D.   On: 10/24/2019 12:31   DG Chest Portable 1 View  Result Date: 11/12/2019 CLINICAL DATA:  Post intubation EXAM: PORTABLE CHEST 1 VIEW COMPARISON:  Radiograph same day FINDINGS: There is cardiomegaly. Aortic knob calcifications. Pulmonary vascular congestion and diffuse interstitial markings throughout both lungs. ET tube is 1.7 cm above the carina. NG tube is seen below the diaphragm. No pneumothorax. No acute osseous abnormality. IMPRESSION: ETT 1.7 cm above the carina. Cardiomegaly and interstitial edema Electronically Signed   By: Prudencio Pair M.D.   On: 10/20/2019 06:25   DG Chest Port 1 View  Result Date: 10/29/2019 CLINICAL DATA:  Shortness of breath EXAM: PORTABLE CHEST 1 VIEW COMPARISON:  10/14/2018 FINDINGS: Cardiomegaly and vascular pedicle widening with diffuse interstitial opacity and airway cuffing. No visible effusion. No pneumothorax. IMPRESSION: CHF pattern. Electronically Signed   By: Monte Fantasia M.D.   On: 10/22/2019 04:21   DG Abd Portable 1V  Result Date: 11/05/2019 CLINICAL DATA:  Encounter for orogastric tube placement. EXAM: PORTABLE ABDOMEN - 1 VIEW COMPARISON:  03/31/2018 FINDINGS: Orogastric tube passes below the diaphragm to curl within the proximal stomach. Normal bowel gas pattern. IMPRESSION: 1. Well-positioned nasal/orogastric tube. Electronically Signed   By: Lajean Manes M.D.   On: 11/12/2019 10:18   VAS [place] LOWER EXTREMITY VENOUS (DVT)  Result Date: 10/30/2019  Lower Venous Study Indications: Suspicion of HIT, and Edema.  Risk Factors: Right BKA. Limitations: Body habitus and bandages. Comparison Study: No prior study on file for comparison. Performing Technologist: Sharion Dove RVS  Examination Guidelines: A complete evaluation includes B-mode imaging, spectral Doppler, color Doppler, and power Doppler as needed of all accessible portions of each  vessel.  Bilateral testing is considered an integral part of a complete examination. Limited examinations for reoccurring indications may be performed as noted.  +---------+---------------+---------+-----------+----------+-------------------+ RIGHT    CompressibilityPhasicitySpontaneityPropertiesThrombus Aging      +---------+---------------+---------+-----------+----------+-------------------+ CFV                     Yes      Yes                  patent by color and                                                       Doppler             +---------+---------------+---------+-----------+----------+-------------------+ FV Prox                 Yes      Yes                  patent by color and                                                       Doppler             +---------+---------------+---------+-----------+----------+-------------------+ FV Mid                                                Not visualized      +---------+---------------+---------+-----------+----------+-------------------+ FV Distal                                             Not visualized      +---------+---------------+---------+-----------+----------+-------------------+ PFV                                                   Not visualized      +---------+---------------+---------+-----------+----------+-------------------+ POP                     Yes      Yes                                      +---------+---------------+---------+-----------+----------+-------------------+ PTV                                                   amputation          +---------+---------------+---------+-----------+----------+-------------------+ PERO  amputation          +---------+---------------+---------+-----------+----------+-------------------+   +---------+---------------+---------+-----------+----------+-------------------+ LEFT      CompressibilityPhasicitySpontaneityPropertiesThrombus Aging      +---------+---------------+---------+-----------+----------+-------------------+ CFV                     Yes      Yes                  patent by color and                                                       Doppler             +---------+---------------+---------+-----------+----------+-------------------+ FV Prox                 Yes      Yes                  patent by color and                                                       Doppler             +---------+---------------+---------+-----------+----------+-------------------+ FV Mid                  Yes      Yes                  patent by color and                                                       Doppler             +---------+---------------+---------+-----------+----------+-------------------+ FV Distal               Yes      Yes                  patent by color and                                                       Doppler             +---------+---------------+---------+-----------+----------+-------------------+ PFV                                                   Not visualized      +---------+---------------+---------+-----------+----------+-------------------+ POP                     Yes      Yes                                      +---------+---------------+---------+-----------+----------+-------------------+  PTV                                                   Not visualized      +---------+---------------+---------+-----------+----------+-------------------+ PERO                                                  Not visualized      +---------+---------------+---------+-----------+----------+-------------------+    Summary: Right: There is no evidence of deep vein thrombosis in the lower extremity. However, portions of this examination were limited- see technologist comments above.  Left: There is no evidence of deep vein thrombosis in the lower extremity. However, portions of this examination were limited- see technologist comments above.  *See table(s) above for measurements and observations. Electronically signed by Monica Martinez MD on 10/30/2019 at 1:51:59 PM.    Final     Microbiology Recent Results (from the past 240 hour(s))  Blood Culture (routine x 2)     Status: None   Collection Time: 11/06/2019  3:47 AM   Specimen: BLOOD  Result Value Ref Range Status   Specimen Description BLOOD LEFT ARM  Final   Special Requests   Final    BOTTLES DRAWN AEROBIC AND ANAEROBIC Blood Culture adequate volume   Culture   Final    NO GROWTH 5 DAYS Performed at [facility], [address], [place], [place] [address]    Report Status 11/02/2019 FINAL  Final  Blood Culture (routine x 2)     Status: None   Collection Time: 10/27/2019  3:47 AM   Specimen: BLOOD  Result Value Ref Range Status   Specimen Description BLOOD RIGHT ARM  Final   Special Requests   Final    BOTTLES DRAWN AEROBIC AND ANAEROBIC Blood Culture results may not be optimal due to an inadequate volume of blood received in culture bottles   Culture   Final    NO GROWTH 5 DAYS Performed at [facility], [place] [address], [place], [place] [address]    Report Status 11/02/2019 FINAL  Final  Respiratory Panel by RT PCR (Flu A&B, Covid) - Nasopharyngeal Swab     Status: None   Collection Time: 11/12/2019  4:33 AM   Specimen: Nasopharyngeal Swab  Result Value Ref Range Status   SARS Coronavirus 2 by RT PCR NEGATIVE NEGATIVE Final    Comment: (NOTE) SARS-CoV-2 target nucleic acids are NOT DETECTED. The SARS-CoV-2 RNA is generally detectable in upper respiratoy specimens during the acute phase of infection. The lowest concentration of SARS-CoV-2 viral copies this assay can detect is 131 copies/mL. A negative result does not preclude SARS-Cov-2 infection and should not be used as the sole basis for  treatment or other patient management decisions. A negative result may occur with  improper specimen collection/handling, submission of specimen other than nasopharyngeal swab, presence of viral mutation(s) within the areas targeted by this assay, and inadequate number of viral copies (<131 copies/mL). A negative result must be combined with clinical observations, patient history, and epidemiological information. The expected result is Negative. Fact Sheet for Patients:  PinkCheek.be Fact Sheet for Healthcare Providers:  GravelBags.it This test is not yet ap proved or cleared by the [place]  States FDA and  has been authorized for detection and/or diagnosis of SARS-CoV-2 by FDA under an Emergency Use Authorization (EUA). This EUA will remain  in effect (meaning this test can be used) for the duration of the COVID-19 declaration under Section 564(b)(1) of the Act, 21 U.S.C. section 360bbb-3(b)(1), unless the authorization is terminated or revoked sooner.    Influenza A by PCR NEGATIVE NEGATIVE Final   Influenza B by PCR NEGATIVE NEGATIVE Final    Comment: (NOTE) The Xpert Xpress SARS-CoV-2/FLU/RSV assay is intended as an aid in  the diagnosis of influenza from Nasopharyngeal swab specimens and  should not be used as a sole basis for treatment. Nasal washings and  aspirates are unacceptable for Xpert Xpress SARS-CoV-2/FLU/RSV  testing. Fact Sheet for Patients: PinkCheek.be Fact Sheet for Healthcare Providers: GravelBags.it This test is not yet approved or cleared by the [place] FDA and  has been authorized for detection and/or diagnosis of SARS-CoV-2 by  FDA under an Emergency Use Authorization (EUA). This EUA will remain  in effect (meaning this test can be used) for the duration of the  Covid-19 declaration under Section 564(b)(1) of the Act, 21  U.S.C. section  360bbb-3(b)(1), unless the authorization is  terminated or revoked. Performed at [facility], [place] [address] [place], [place] [address]   MRSA PCR Screening     Status: Abnormal   Collection Time: 10/19/2019 10:07 AM   Specimen: Nasal Mucosa; Nasopharyngeal  Result Value Ref Range Status   MRSA by PCR POSITIVE (A) NEGATIVE Final    Comment:        The GeneXpert MRSA Assay (FDA approved for NASAL specimens only), is one component of a comprehensive MRSA colonization surveillance program. It is not intended to diagnose MRSA infection nor to guide or monitor treatment for MRSA infections. RESULT CALLED TO, READ BACK BY AND VERIFIED WITH: A. Lewis RN 11:40 11/02/2019 ([email or web address]) Performed at [facility], [place] [address], [place], [place] [address]   Respiratory Panel by PCR     Status: None   Collection Time: 10/26/2019 10:40 AM   Specimen: Nasopharyngeal Swab; Respiratory  Result Value Ref Range Status   Adenovirus NOT DETECTED NOT DETECTED Final   Coronavirus 229E NOT DETECTED NOT DETECTED Final    Comment: (NOTE) The Coronavirus on the Respiratory Panel, DOES NOT test for the novel  Coronavirus (2019 nCoV)    Coronavirus HKU1 NOT DETECTED NOT DETECTED Final   Coronavirus NL63 NOT DETECTED NOT DETECTED Final   Coronavirus OC43 NOT DETECTED NOT DETECTED Final   Metapneumovirus NOT DETECTED NOT DETECTED Final   Rhinovirus / Enterovirus NOT DETECTED NOT DETECTED Final   Influenza A NOT DETECTED NOT DETECTED Final   Influenza B NOT DETECTED NOT DETECTED Final   Parainfluenza Virus 1 NOT DETECTED NOT DETECTED Final   Parainfluenza Virus 2 NOT DETECTED NOT DETECTED Final   Parainfluenza Virus 3 NOT DETECTED NOT DETECTED Final   Parainfluenza Virus 4 NOT DETECTED NOT DETECTED Final   Respiratory Syncytial Virus NOT DETECTED NOT DETECTED Final   Bordetella pertussis NOT DETECTED NOT DETECTED Final   Chlamydophila pneumoniae NOT DETECTED NOT DETECTED Final   Mycoplasma  pneumoniae NOT DETECTED NOT DETECTED Final    Comment: Performed at [facility], [place]. [address] [place], [place] [address]  Culture, blood (routine x 2)     Status: None (Preliminary result)   Collection Time: 11/01/19  2:18 PM   Specimen: BLOOD RIGHT HAND  Result Value  Ref Range Status   Specimen Description BLOOD RIGHT HAND  Final   Special Requests   Final    BOTTLES DRAWN AEROBIC ONLY Blood Culture adequate volume   Culture   Final    NO GROWTH 3 DAYS Performed at [facility], [address], [place], [place] [address]    Report Status PENDING  Incomplete  Culture, blood (routine x 2)     Status: None (Preliminary result)   Collection Time: 11/01/19  2:25 PM   Specimen: BLOOD LEFT HAND  Result Value Ref Range Status   Specimen Description BLOOD LEFT HAND  Final   Special Requests   Final    BOTTLES DRAWN AEROBIC AND ANAEROBIC Blood Culture adequate volume   Culture   Final    NO GROWTH 3 DAYS Performed at [facility], [place] [address], [place], [place] [address]    Report Status PENDING  Incomplete  Culture, respiratory (non-expectorated)     Status: None   Collection Time: 11/01/19 10:08 PM   Specimen: Tracheal Aspirate; Respiratory  Result Value Ref Range Status   Specimen Description TRACHEAL ASPIRATE  Final   Special Requests NONE  Final   Gram Stain   Final    FEW WBC PRESENT, PREDOMINANTLY PMN RARE GRAM POSITIVE COCCI    Culture   Final    NO GROWTH 2 DAYS Performed at [facility], [address], [place], [place] [address]    Report Status 11/03/2019 FINAL  Final    Lab Basic Metabolic Panel: Recent Labs  Lab 10/29/19 0548 10/29/19 0607 10/30/19 0803 10/31/19 0356 10/31/19 0446 10/31/19 0446 11/01/19 0418 11/01/19 0418 11/01/19 0931 11/01/19 2320 11/02/19 0419 11/02/19 0951 11/03/19 0345  NA 141   < > 145   < > 146*   < > 147*   < > 147* 146* 147* 144 144  K 4.1   < > 3.7   < > 3.8   < > 4.1   < > 4.0 4.4 4.6 4.8 5.2*   CL 97*   < > 102  --  102  --  103  --   --   --  106  --  107  CO2 36*   < > 34*  --  34*  --  34*  --   --   --  30  --  25  GLUCOSE 178*   < > 191*  --  182*  --  214*  --   --   --  243*  --  296*  BUN 46*   < > 51*  --  65*  --  78*  --   --   --  95*  --  125*  CREATININE 2.21*   < > 1.93*  --  2.11*  --  2.45*  --   --   --  3.16*  --  4.57*  CALCIUM 8.0*   < > 7.8*  --  7.8*  --  8.2*  --   --   --  8.2*  --  8.3*  MG 1.9  --   --   --   --   --   --   --   --   --   --   --   --   PHOS 1.8*  --  3.3  --   --   --   --   --   --   --   --   --   --    < > =  values in this interval not displayed.   Liver Function Tests: Recent Labs  Lab 10/31/19 0446 11/01/19 0418 11/02/19 0419 11/03/19 0345  AST 45* 54* 149* 213*  ALT 19 21 59* 92*  ALKPHOS 39 51 84 71  BILITOT 0.9 0.7 0.8 0.8  PROT 5.4* 6.0* 5.9* 5.7*  ALBUMIN 1.7* 1.7* 1.6* 1.3*   No results for input(s): LIPASE, AMYLASE in the last 168 hours. No results for input(s): AMMONIA in the last 168 hours. CBC: Recent Labs  Lab 10/30/19 0803 10/31/19 0356 10/31/19 0446 10/31/19 0446 11/01/19 0418 11/01/19 0418 11/01/19 0931 11/01/19 2320 11/02/19 0419 11/02/19 0951 11/03/19 0345  WBC 2.1*  --  2.1*  --  2.6*  --   --   --  5.9  --  8.0  NEUTROABS  --   --  1.3*  --  1.7  --   --   --  4.5  --  5.9  HGB 12.8   < > 13.0   < > 14.2   < > 15.6* 17.3* 16.3* 18.4* 16.7*  HCT 45.0   < > 45.4   < > 50.8*   < > 46.0 51.0* 55.9* 54.0* 60.4*  MCV 97.0  --  96.4  --  98.8  --   --   --  97.2  --  99.7  PLT 85*  --  88*  --  71*  --   --   --  84*  --  126*   < > = values in this interval not displayed.   Cardiac Enzymes: No results for input(s): CKTOTAL, CKMB, CKMBINDEX, TROPONINI in the last 168 hours. Sepsis Labs: Recent Labs  Lab 10/29/19 0548 10/29/19 0947 10/30/19 0436 10/30/19 0803 10/31/19 0446 11/01/19 0418 11/01/19 0744 11/02/19 0419 11/03/19 0345  PROCALCITON 0.18  --  0.86  --   --   --  1.48 2.57  --    WBC  --    < >  --    < > 2.1* 2.6*  --  5.9 8.0   < > = values in this interval not displayed.     Spero Geralds 11/30/2019, 4:07 PM

## 2019-11-15 NOTE — Progress Notes (Signed)
240 mL of fentanyl wasted with Daphane Shepherd.

## 2019-11-15 DEATH — deceased
